# Patient Record
Sex: Female | Born: 1945 | Race: Black or African American | Hispanic: No | Marital: Single | Smoking: Former smoker
Health system: Southern US, Community
[De-identification: ages and names within clinical notes are randomized; demographics above are authoritative.]

## PROBLEM LIST (undated history)

## (undated) ENCOUNTER — Emergency Department (HOSPITAL_COMMUNITY): Admission: EM | Discharge status: Against Medical Advice | Payer: Medicare HMO

## (undated) ENCOUNTER — Emergency Department (HOSPITAL_BASED_OUTPATIENT_CLINIC_OR_DEPARTMENT_OTHER)
Admission: EM | Discharge status: Against Medical Advice | Payer: Medicare HMO | Attending: Emergency Medicine | Admitting: Emergency Medicine

## (undated) DIAGNOSIS — I639 Cerebral infarction, unspecified: Secondary | ICD-10-CM

## (undated) DIAGNOSIS — E785 Hyperlipidemia, unspecified: Secondary | ICD-10-CM

## (undated) DIAGNOSIS — J449 Chronic obstructive pulmonary disease, unspecified: Secondary | ICD-10-CM

## (undated) DIAGNOSIS — I739 Peripheral vascular disease, unspecified: Secondary | ICD-10-CM

## (undated) DIAGNOSIS — Z992 Dependence on renal dialysis: Secondary | ICD-10-CM

## (undated) DIAGNOSIS — I10 Essential (primary) hypertension: Secondary | ICD-10-CM

## (undated) DIAGNOSIS — N289 Disorder of kidney and ureter, unspecified: Secondary | ICD-10-CM

## (undated) DIAGNOSIS — N186 End stage renal disease: Secondary | ICD-10-CM

## (undated) DIAGNOSIS — I509 Heart failure, unspecified: Secondary | ICD-10-CM

## (undated) DIAGNOSIS — M1030 Gout due to renal impairment, unspecified site: Secondary | ICD-10-CM

## (undated) HISTORY — DX: Essential (primary) hypertension: I10

## (undated) HISTORY — PX: OTHER SURGICAL HISTORY: SHX169

## (undated) HISTORY — DX: Hyperlipidemia, unspecified: E78.5

---

## 1997-03-15 DIAGNOSIS — I639 Cerebral infarction, unspecified: Secondary | ICD-10-CM

## 1997-03-15 HISTORY — DX: Cerebral infarction, unspecified: I63.9

## 2000-03-15 HISTORY — PX: CAROTID ENDARTERECTOMY: SUR193

## 2009-08-21 ENCOUNTER — Inpatient Hospital Stay (HOSPITAL_COMMUNITY): Admission: EM | Admit: 2009-08-21 | Discharge: 2009-08-31 | Payer: Self-pay | Admitting: Emergency Medicine

## 2009-08-22 ENCOUNTER — Ambulatory Visit: Payer: Self-pay | Admitting: Vascular Surgery

## 2009-08-22 ENCOUNTER — Encounter (INDEPENDENT_AMBULATORY_CARE_PROVIDER_SITE_OTHER): Payer: Self-pay | Admitting: Nephrology

## 2009-12-12 ENCOUNTER — Ambulatory Visit: Payer: Self-pay | Admitting: Vascular Surgery

## 2009-12-12 ENCOUNTER — Ambulatory Visit (HOSPITAL_COMMUNITY): Admission: RE | Admit: 2009-12-12 | Discharge: 2009-12-12 | Payer: Self-pay | Admitting: Vascular Surgery

## 2010-01-13 ENCOUNTER — Ambulatory Visit: Payer: Self-pay | Admitting: Vascular Surgery

## 2010-01-16 ENCOUNTER — Ambulatory Visit: Payer: Self-pay | Admitting: Vascular Surgery

## 2010-01-16 ENCOUNTER — Ambulatory Visit (HOSPITAL_COMMUNITY)
Admission: RE | Admit: 2010-01-16 | Discharge: 2010-01-16 | Payer: Self-pay | Source: Home / Self Care | Admitting: Vascular Surgery

## 2010-03-04 ENCOUNTER — Ambulatory Visit (HOSPITAL_COMMUNITY)
Admission: RE | Admit: 2010-03-04 | Discharge: 2010-03-04 | Payer: Self-pay | Source: Home / Self Care | Attending: Surgery | Admitting: Surgery

## 2010-04-27 ENCOUNTER — Ambulatory Visit: Payer: Self-pay | Admitting: Surgery

## 2010-05-04 ENCOUNTER — Ambulatory Visit (INDEPENDENT_AMBULATORY_CARE_PROVIDER_SITE_OTHER): Payer: Medicare PPO | Admitting: Surgery

## 2010-05-04 DIAGNOSIS — N186 End stage renal disease: Secondary | ICD-10-CM

## 2010-05-04 NOTE — Assessment & Plan Note (Signed)
OFFICE VISIT  Alexandra [name], Alexandra [name] DOB:  1945/05/01                                       05/04/2010 CHART#:[id number]  REASON FOR VISIT:  Non-maturing fistula.  HISTORY:  This is a 66 year old female with end-stage renal disease dialyzing through a catheter.  She failed peritoneal dialysis.  She had a left upper arm fistula placed by Dr. Hart Rochester on November 4.  It has not fully matured.  They are not able to access it, and she comes in today for evaluation.  PHYSICAL EXAMINATION:  Heart rate 88, blood pressure 167/82, respiratory rate 20.  General:  She is well-appearing in no distress.  Respirations are nonlabored.  She has minimal edema.  There is a thrill within the left upper arm fistula.  However, it appears to be rather small and more difficult to feel as you go up the arm.  ASSESSMENT:  A non-maturing left upper arm fistula.  PLAN:  The fistula is going to need to be evaluated with ultrasound. Due to the patient's schedule, this could not be accommodated today.  I am going to have her come back in 2 weeks.  We will evaluate her fistula with ultrasound and make the appropriate arrangements for assisting in its maturation.    Jorge Ny, MD Electronically Signed  VWB/MEDQ  D:  05/04/2010  T:  05/04/2010  Job:  [id number]  cc:   Renaye Rakers, M.D. Duke Salvia Eliott Nine, M.D.

## 2010-05-04 NOTE — Assessment & Plan Note (Signed)
OFFICE VISIT  Alexandra, [name] DOB:  1945/12/28                                       05/04/2010 CHART#:[id number]  This may be a duplicate dictation, I do not know if the first one was complete.  This is a 65 year old female with end-stage renal disease who dialyzes through a catheter.  She had a left upper arm fistula placed by Dr. Hart Rochester on November 4.  They have not been able to use this secondary to nonmaturation.  She comes in today for evaluation.  PHYSICAL EXAMINATION:  Vital signs:  Heart rate 88, blood pressure 167/82, respiratory rate 20.  General:  She is well-appearing, in no distress.  Lungs:  Respirations nonlabored.  Extremities:  Are without edema.  Left arm:  There is palpable thrill within the fistula at the antecubital crease.  It is more difficult to feel as you go up the arm.  ASSESSMENT:  Nonmaturing fistula.  PLAN:  The patient needs to be further evaluated with a duplex ultrasound to evaluate for stenosis and/or branches so that will determine what the next step needs to be taken in order to get this to fully mature.  The patient due to her work constraints was unable to stay for the ultrasound.  We have rescheduled this for 2 weeks.    Jorge Ny, MD Electronically Signed  VWB/MEDQ  D:  05/04/2010  T:  05/04/2010  Job:  [id number]  cc:   Duke Salvia. Eliott Nine, M.D. Renaye Rakers, M.D.

## 2010-05-25 ENCOUNTER — Ambulatory Visit (INDEPENDENT_AMBULATORY_CARE_PROVIDER_SITE_OTHER): Payer: Medicare PPO | Admitting: Surgery

## 2010-05-25 ENCOUNTER — Encounter (INDEPENDENT_AMBULATORY_CARE_PROVIDER_SITE_OTHER): Payer: Medicare PPO

## 2010-05-25 DIAGNOSIS — T82598A Other mechanical complication of other cardiac and vascular devices and implants, initial encounter: Secondary | ICD-10-CM

## 2010-05-25 DIAGNOSIS — N186 End stage renal disease: Secondary | ICD-10-CM

## 2010-05-25 LAB — BASIC METABOLIC PANEL
GFR calc non Af Amer: 4 mL/min — ABNORMAL LOW (ref >60)
Glucose, Bld: 175 mg/dL — ABNORMAL HIGH (ref 70–99)
Potassium: 4.8 mEq/L (ref 3.5–5.1)
Sodium: 136 mEq/L (ref 135–145)

## 2010-05-25 LAB — POCT I-STAT 4, (NA,K, GLUC, HGB,HCT)
Glucose, Bld: 95 mg/dL (ref 70–99)
Hemoglobin: 18 g/dL — ABNORMAL HIGH (ref 12.0–15.0)
Potassium: 5.2 mEq/L — ABNORMAL HIGH (ref 3.5–5.1)

## 2010-05-25 LAB — CBC
HCT: 50.7 % — ABNORMAL HIGH (ref 36.0–46.0)
Hemoglobin: 16.7 g/dL — ABNORMAL HIGH (ref 12.0–15.0)
WBC: 9.8 10*3/uL (ref 4.0–10.5)

## 2010-05-25 LAB — GLUCOSE, CAPILLARY: Glucose-Capillary: 72 mg/dL (ref 70–99)

## 2010-05-25 LAB — HEPATIC FUNCTION PANEL
ALT: 26 U/L (ref 0–35)
AST: 32 U/L (ref 0–37)
Albumin: 3.8 g/dL (ref 3.5–5.2)
Alkaline Phosphatase: 81 U/L (ref 39–117)
Total Bilirubin: 0.4 mg/dL (ref 0.3–1.2)

## 2010-05-26 LAB — GLUCOSE, CAPILLARY
Glucose-Capillary: 126 mg/dL — ABNORMAL HIGH (ref 70–99)
Glucose-Capillary: 73 mg/dL (ref 70–99)

## 2010-05-26 LAB — SURGICAL PCR SCREEN: Staphylococcus aureus: NEGATIVE

## 2010-05-26 LAB — POCT I-STAT 4, (NA,K, GLUC, HGB,HCT): Hemoglobin: 9.5 g/dL — ABNORMAL LOW (ref 12.0–15.0)

## 2010-05-26 NOTE — Assessment & Plan Note (Unsigned)
OFFICE VISIT  Alexandra [name], Alexandra [name] DOB:  [birth year]                                       05/25/2010 CHART#:[id number]  The patient comes back today for followup of her fistula that was placed by Dr. Hart Rochester on November 4 that has not fully matured.  The last time I saw her she could not wait to have a graft study done because of clinic she had to get to.  That was done today.  PHYSICAL EXAMINATION:  Vital signs:  Heart rate 78, blood pressure 147/70, respiratory rate 12.  General:  She is well-appearing, no distress.  Respirations:  Nonlabored.  Cardiovascular:  There is a good thrill within her fistula which is easily visualized just at the level of the arteriovenous anastomosis.  It them becomes less visible and less palpable.  There is no swelling in the arm.  DIAGNOSTICS:  Fistula evaluation was performed which shows a high-grade stenosis just past the arteriovenous anastomosis as well as in the proximal vein near the shoulder.  ASSESSMENT:  Non-maturing left upper arm fistula.  PLAN:  I have scheduled the patient for fistulogram to be performed on Wednesday, March 21.  I will plan on accessing near the arteriovenous anastomosis so that I could potentially intervene.  I will need to scan her whole arm with ultrasound to make sure that the access in the antecubital is the most appropriate access initially.  She may require additional accesses to successfully intervene.    Jorge Ny, MD  VWB/MEDQ  D:  05/25/2010  T:  05/26/2010  Job:  [id number]  cc:   Miguel Aschoff, M.D. Renaye Rakers, M.D. Duke Salvia Eliott Nine, M.D. Dr. Allena Katz (Nephrology)

## 2010-05-31 LAB — BASIC METABOLIC PANEL
BUN: 74 mg/dL — ABNORMAL HIGH (ref 6–23)
Chloride: 106 mEq/L (ref 96–112)
Creatinine, Ser: 11.87 mg/dL — ABNORMAL HIGH (ref 0.4–1.2)
Glucose, Bld: 112 mg/dL — ABNORMAL HIGH (ref 70–99)

## 2010-05-31 LAB — CBC
HCT: 28 % — ABNORMAL LOW (ref 36.0–46.0)
Hemoglobin: 8.5 g/dL — ABNORMAL LOW (ref 12.0–15.0)
Hemoglobin: 9.5 g/dL — ABNORMAL LOW (ref 12.0–15.0)
MCHC: 33.3 g/dL (ref 30.0–36.0)
MCHC: 34.5 g/dL (ref 30.0–36.0)
MCV: 88.6 fL (ref 78.0–100.0)
MCV: 89 fL (ref 78.0–100.0)
MCV: 89.2 fL (ref 78.0–100.0)
Platelets: 203 10*3/uL (ref 150–400)
Platelets: 207 10*3/uL (ref 150–400)
RBC: 2.79 MIL/uL — ABNORMAL LOW (ref 3.87–5.11)
RBC: 3.15 MIL/uL — ABNORMAL LOW (ref 3.87–5.11)
RDW: 14.9 % (ref 11.5–15.5)
WBC: 7.2 10*3/uL (ref 4.0–10.5)
WBC: 8.4 10*3/uL (ref 4.0–10.5)

## 2010-05-31 LAB — RENAL FUNCTION PANEL
Albumin: 1.7 g/dL — ABNORMAL LOW (ref 3.5–5.2)
Albumin: 1.8 g/dL — ABNORMAL LOW (ref 3.5–5.2)
BUN: 36 mg/dL — ABNORMAL HIGH (ref 6–23)
CO2: 21 mEq/L (ref 19–32)
CO2: 21 mEq/L (ref 19–32)
CO2: 24 mEq/L (ref 19–32)
Calcium: 8.1 mg/dL — ABNORMAL LOW (ref 8.4–10.5)
Calcium: 8.5 mg/dL (ref 8.4–10.5)
Calcium: 8.6 mg/dL (ref 8.4–10.5)
Chloride: 105 mEq/L (ref 96–112)
Creatinine, Ser: 7.96 mg/dL — ABNORMAL HIGH (ref 0.4–1.2)
GFR calc Af Amer: 4 mL/min — ABNORMAL LOW (ref >60)
GFR calc Af Amer: 4 mL/min — ABNORMAL LOW (ref >60)
GFR calc Af Amer: 6 mL/min — ABNORMAL LOW (ref >60)
GFR calc non Af Amer: 3 mL/min — ABNORMAL LOW (ref >60)
GFR calc non Af Amer: 3 mL/min — ABNORMAL LOW (ref >60)
GFR calc non Af Amer: 5 mL/min — ABNORMAL LOW (ref >60)
Glucose, Bld: 114 mg/dL — ABNORMAL HIGH (ref 70–99)
Phosphorus: 6 mg/dL — ABNORMAL HIGH (ref 2.3–4.6)
Phosphorus: 7 mg/dL — ABNORMAL HIGH (ref 2.3–4.6)
Potassium: 4.5 mEq/L (ref 3.5–5.1)
Potassium: 4.6 mEq/L (ref 3.5–5.1)
Sodium: 135 mEq/L (ref 135–145)
Sodium: 136 mEq/L (ref 135–145)
Sodium: 137 mEq/L (ref 135–145)

## 2010-05-31 LAB — GLUCOSE, CAPILLARY
Glucose-Capillary: 105 mg/dL — ABNORMAL HIGH (ref 70–99)
Glucose-Capillary: 107 mg/dL — ABNORMAL HIGH (ref 70–99)
Glucose-Capillary: 109 mg/dL — ABNORMAL HIGH (ref 70–99)
Glucose-Capillary: 122 mg/dL — ABNORMAL HIGH (ref 70–99)
Glucose-Capillary: 125 mg/dL — ABNORMAL HIGH (ref 70–99)
Glucose-Capillary: 151 mg/dL — ABNORMAL HIGH (ref 70–99)
Glucose-Capillary: 155 mg/dL — ABNORMAL HIGH (ref 70–99)
Glucose-Capillary: 168 mg/dL — ABNORMAL HIGH (ref 70–99)
Glucose-Capillary: 226 mg/dL — ABNORMAL HIGH (ref 70–99)

## 2010-05-31 LAB — HEPATITIS B CORE ANTIBODY, TOTAL
Hep B Core Total Ab: POSITIVE — AB
Hep B Core Total Ab: POSITIVE — AB

## 2010-05-31 LAB — HEPATITIS B SURFACE ANTIBODY,QUALITATIVE: Hep B S Ab: NEGATIVE

## 2010-05-31 LAB — PROTIME-INR
INR: 0.89 (ref 0.00–1.49)
Prothrombin Time: 12 seconds (ref 11.6–15.2)

## 2010-05-31 LAB — POCT I-STAT 4, (NA,K, GLUC, HGB,HCT): HCT: 22 % — ABNORMAL LOW (ref 36.0–46.0)

## 2010-06-01 LAB — RENAL FUNCTION PANEL
Albumin: 1.6 g/dL — ABNORMAL LOW (ref 3.5–5.2)
BUN: 72 mg/dL — ABNORMAL HIGH (ref 6–23)
CO2: 22 mEq/L (ref 19–32)
CO2: 23 mEq/L (ref 19–32)
Calcium: 7.7 mg/dL — ABNORMAL LOW (ref 8.4–10.5)
Chloride: 105 mEq/L (ref 96–112)
Chloride: 107 mEq/L (ref 96–112)
Creatinine, Ser: 10.71 mg/dL — ABNORMAL HIGH (ref 0.4–1.2)
GFR calc Af Amer: 4 mL/min — ABNORMAL LOW (ref >60)
GFR calc Af Amer: 4 mL/min — ABNORMAL LOW (ref >60)
GFR calc non Af Amer: 3 mL/min — ABNORMAL LOW (ref >60)
GFR calc non Af Amer: 4 mL/min — ABNORMAL LOW (ref >60)
Glucose, Bld: 200 mg/dL — ABNORMAL HIGH (ref 70–99)
Phosphorus: 5.1 mg/dL — ABNORMAL HIGH (ref 2.3–4.6)
Phosphorus: 5.1 mg/dL — ABNORMAL HIGH (ref 2.3–4.6)
Potassium: 4.3 mEq/L (ref 3.5–5.1)
Sodium: 137 mEq/L (ref 135–145)

## 2010-06-01 LAB — HEPATITIS PANEL, ACUTE
HCV Ab: NEGATIVE
Hep A IgM: NEGATIVE
Hepatitis B Surface Ag: NEGATIVE

## 2010-06-01 LAB — URINALYSIS, ROUTINE W REFLEX MICROSCOPIC
Glucose, UA: NEGATIVE mg/dL
Ketones, ur: NEGATIVE mg/dL
Leukocytes, UA: NEGATIVE
Specific Gravity, Urine: >1.03 — ABNORMAL HIGH (ref 1.005–1.030)
pH: 6 (ref 5.0–8.0)

## 2010-06-01 LAB — COMPREHENSIVE METABOLIC PANEL
ALT: 17 U/L (ref 0–35)
ALT: 17 U/L (ref 0–35)
AST: 18 U/L (ref 0–37)
AST: 19 U/L (ref 0–37)
Albumin: 1.9 g/dL — ABNORMAL LOW (ref 3.5–5.2)
Alkaline Phosphatase: 74 U/L (ref 39–117)
BUN: 80 mg/dL — ABNORMAL HIGH (ref 6–23)
CO2: 17 mEq/L — ABNORMAL LOW (ref 19–32)
CO2: 20 mEq/L (ref 19–32)
CO2: 22 mEq/L (ref 19–32)
Calcium: 8.6 mg/dL (ref 8.4–10.5)
Chloride: 109 mEq/L (ref 96–112)
Chloride: 111 mEq/L (ref 96–112)
Chloride: 113 mEq/L — ABNORMAL HIGH (ref 96–112)
Creatinine, Ser: 12.84 mg/dL — ABNORMAL HIGH (ref 0.4–1.2)
GFR calc Af Amer: 3 mL/min — ABNORMAL LOW (ref >60)
GFR calc Af Amer: 4 mL/min — ABNORMAL LOW (ref >60)
GFR calc non Af Amer: 3 mL/min — ABNORMAL LOW (ref >60)
GFR calc non Af Amer: 3 mL/min — ABNORMAL LOW (ref >60)
GFR calc non Af Amer: 3 mL/min — ABNORMAL LOW (ref >60)
Glucose, Bld: 86 mg/dL (ref 70–99)
Glucose, Bld: 88 mg/dL (ref 70–99)
Potassium: 6.5 mEq/L (ref 3.5–5.1)
Sodium: 139 mEq/L (ref 135–145)
Sodium: 142 mEq/L (ref 135–145)
Total Bilirubin: 0.3 mg/dL (ref 0.3–1.2)
Total Bilirubin: 0.5 mg/dL (ref 0.3–1.2)
Total Bilirubin: 0.5 mg/dL (ref 0.3–1.2)

## 2010-06-01 LAB — CBC
HCT: 26.4 % — ABNORMAL LOW (ref 36.0–46.0)
HCT: 28.6 % — ABNORMAL LOW (ref 36.0–46.0)
HCT: 30.6 % — ABNORMAL LOW (ref 36.0–46.0)
Hemoglobin: 10 g/dL — ABNORMAL LOW (ref 12.0–15.0)
Hemoglobin: 10.5 g/dL — ABNORMAL LOW (ref 12.0–15.0)
MCHC: 33.8 g/dL (ref 30.0–36.0)
MCHC: 34.3 g/dL (ref 30.0–36.0)
MCHC: 35 g/dL (ref 30.0–36.0)
MCV: 87.6 fL (ref 78.0–100.0)
MCV: 87.9 fL (ref 78.0–100.0)
MCV: 88.4 fL (ref 78.0–100.0)
MCV: 88.8 fL (ref 78.0–100.0)
Platelets: 202 10*3/uL (ref 150–400)
Platelets: 212 10*3/uL (ref 150–400)
RBC: 2.99 MIL/uL — ABNORMAL LOW (ref 3.87–5.11)
RBC: 3.13 MIL/uL — ABNORMAL LOW (ref 3.87–5.11)
RBC: 3.26 MIL/uL — ABNORMAL LOW (ref 3.87–5.11)
RBC: 3.27 MIL/uL — ABNORMAL LOW (ref 3.87–5.11)
RBC: 3.48 MIL/uL — ABNORMAL LOW (ref 3.87–5.11)
RDW: 15 % (ref 11.5–15.5)
WBC: 6.7 10*3/uL (ref 4.0–10.5)
WBC: 6.8 10*3/uL (ref 4.0–10.5)
WBC: 7.7 10*3/uL (ref 4.0–10.5)
WBC: 7.9 10*3/uL (ref 4.0–10.5)
WBC: 9.9 10*3/uL (ref 4.0–10.5)

## 2010-06-01 LAB — GLUCOSE, CAPILLARY
Glucose-Capillary: 100 mg/dL — ABNORMAL HIGH (ref 70–99)
Glucose-Capillary: 110 mg/dL — ABNORMAL HIGH (ref 70–99)
Glucose-Capillary: 111 mg/dL — ABNORMAL HIGH (ref 70–99)
Glucose-Capillary: 122 mg/dL — ABNORMAL HIGH (ref 70–99)
Glucose-Capillary: 122 mg/dL — ABNORMAL HIGH (ref 70–99)
Glucose-Capillary: 127 mg/dL — ABNORMAL HIGH (ref 70–99)
Glucose-Capillary: 134 mg/dL — ABNORMAL HIGH (ref 70–99)
Glucose-Capillary: 156 mg/dL — ABNORMAL HIGH (ref 70–99)
Glucose-Capillary: 182 mg/dL — ABNORMAL HIGH (ref 70–99)
Glucose-Capillary: 184 mg/dL — ABNORMAL HIGH (ref 70–99)
Glucose-Capillary: 216 mg/dL — ABNORMAL HIGH (ref 70–99)
Glucose-Capillary: 75 mg/dL (ref 70–99)
Glucose-Capillary: 88 mg/dL (ref 70–99)

## 2010-06-01 LAB — IGG, IGA, IGM
IgA: 264 mg/dL (ref 68–378)
IgG (Immunoglobin G), Serum: 544 mg/dL — ABNORMAL LOW (ref 694–1618)
IgM, Serum: 92 mg/dL (ref 60–263)

## 2010-06-01 LAB — BASIC METABOLIC PANEL
CO2: 22 mEq/L (ref 19–32)
Chloride: 109 mEq/L (ref 96–112)
Creatinine, Ser: 11.08 mg/dL — ABNORMAL HIGH (ref 0.4–1.2)
GFR calc Af Amer: 4 mL/min — ABNORMAL LOW (ref >60)
Potassium: 3.8 mEq/L (ref 3.5–5.1)

## 2010-06-01 LAB — PROTEIN ELECTROPH W RFLX QUANT IMMUNOGLOBULINS
Alpha-2-Globulin: 15.6 % — ABNORMAL HIGH (ref 7.1–11.8)
Beta 2: 7.2 % — ABNORMAL HIGH (ref 3.2–6.5)
Beta Globulin: 5.3 % (ref 4.7–7.2)
Gamma Globulin: 11.2 % (ref 11.1–18.8)
M-Spike, %: 0.2 g/dL
Total Protein ELP: 5 g/dL — ABNORMAL LOW (ref 6.0–8.3)

## 2010-06-01 LAB — IMMUNOFIXATION ADD-ON

## 2010-06-01 LAB — URINE MICROSCOPIC-ADD ON

## 2010-06-01 LAB — URINE CULTURE

## 2010-06-01 LAB — PTH, INTACT AND CALCIUM
Calcium, Total (PTH): 7.2 mg/dL — ABNORMAL LOW (ref 8.4–10.5)
PTH: 422.5 pg/mL — ABNORMAL HIGH (ref 14.0–72.0)

## 2010-06-01 LAB — DIFFERENTIAL
Blasts: 0 %
Lymphocytes Relative: 19 % (ref 12–46)
Lymphs Abs: 1.5 10*3/uL (ref 0.7–4.0)
Monocytes Absolute: 0.5 10*3/uL (ref 0.1–1.0)
Monocytes Relative: 6 % (ref 3–12)
Promyelocytes Absolute: 0 %
nRBC: 0 /100 WBC

## 2010-06-01 LAB — ANA: Anti Nuclear Antibody(ANA): NEGATIVE

## 2010-06-01 LAB — HEMOGLOBIN A1C: Mean Plasma Glucose: 105 mg/dL (ref <117)

## 2010-06-01 LAB — IRON AND TIBC: Iron: 60 ug/dL (ref 42–135)

## 2010-06-01 LAB — VITAMIN D 25 HYDROXY (VIT D DEFICIENCY, FRACTURES): Vit D, 25-Hydroxy: 10 ng/mL — ABNORMAL LOW (ref 30–89)

## 2010-06-01 LAB — FOLATE RBC: RBC Folate: 681 ng/mL — ABNORMAL HIGH (ref 180–600)

## 2010-06-01 LAB — PHOSPHORUS: Phosphorus: 5 mg/dL — ABNORMAL HIGH (ref 2.3–4.6)

## 2010-06-03 ENCOUNTER — Ambulatory Visit (HOSPITAL_COMMUNITY)
Admission: RE | Admit: 2010-06-03 | Discharge: 2010-06-03 | Disposition: A | Discharge status: Home / Self Care | Payer: Medicare HMO | Source: Ambulatory Visit | Attending: Surgery | Admitting: Surgery

## 2010-06-03 DIAGNOSIS — I120 Hypertensive chronic kidney disease with stage 5 chronic kidney disease or end stage renal disease: Secondary | ICD-10-CM

## 2010-06-03 DIAGNOSIS — T82598A Other mechanical complication of other cardiac and vascular devices and implants, initial encounter: Secondary | ICD-10-CM | POA: Insufficient documentation

## 2010-06-03 DIAGNOSIS — Y849 Medical procedure, unspecified as the cause of abnormal reaction of the patient, or of later complication, without mention of misadventure at the time of the procedure: Secondary | ICD-10-CM | POA: Insufficient documentation

## 2010-06-03 DIAGNOSIS — T82898A Other specified complication of vascular prosthetic devices, implants and grafts, initial encounter: Secondary | ICD-10-CM

## 2010-06-03 DIAGNOSIS — N186 End stage renal disease: Secondary | ICD-10-CM

## 2010-06-03 LAB — POCT I-STAT, CHEM 8
HCT: 26 % — ABNORMAL LOW (ref 36.0–46.0)
Hemoglobin: 8.8 g/dL — ABNORMAL LOW (ref 12.0–15.0)
Potassium: 3.7 mEq/L (ref 3.5–5.1)
Sodium: 138 mEq/L (ref 135–145)

## 2010-06-04 LAB — POCT ACTIVATED CLOTTING TIME: Activated Clotting Time: 169 seconds

## 2010-06-09 NOTE — Procedures (Unsigned)
VASCULAR LAB EXAM  INDICATION:  Left arteriovenous fistula not maturing.  HISTORY: Chronic kidney disease. Diabetes:  No. Cardiac: Hypertension:  Yes.  EXAM:  Left hemodialysis duplex evaluation. Proximal venous outflow presents with elevated velocities with diameter narrowing present. Distal venous outflow presents with elevated velocities and diameter narrowing.  IMPRESSION: 1. The left venous outflow at the distal brachium presents with     diameter narrowing of 0.34 cm to a diameter of 0.61 cm and a peak     systolic velocity of 834 cm/second. 2. There is a second area of diameter narrowing at the proximal     brachium from 0.27 cm to 0.16 cm dilating back to 0.31 cm and a     velocity change from 115 cm/second to 424 cm/second. 3. No thrombus or valve leaflets are identified.     ___________________________________________ V. Charlena Cross, MD  SH/MEDQ  D:  05/25/2010  T:  05/25/2010  Job:  [id number]

## 2010-06-10 ENCOUNTER — Ambulatory Visit (HOSPITAL_COMMUNITY)
Admission: RE | Admit: 2010-06-10 | Discharge: 2010-06-10 | Disposition: A | Discharge status: Home / Self Care | Payer: Medicare PPO | Source: Ambulatory Visit | Attending: Surgery | Admitting: Surgery

## 2010-06-10 DIAGNOSIS — T82598A Other mechanical complication of other cardiac and vascular devices and implants, initial encounter: Secondary | ICD-10-CM | POA: Insufficient documentation

## 2010-06-10 DIAGNOSIS — N186 End stage renal disease: Secondary | ICD-10-CM

## 2010-06-10 DIAGNOSIS — Y832 Surgical operation with anastomosis, bypass or graft as the cause of abnormal reaction of the patient, or of later complication, without mention of misadventure at the time of the procedure: Secondary | ICD-10-CM | POA: Insufficient documentation

## 2010-06-10 DIAGNOSIS — I120 Hypertensive chronic kidney disease with stage 5 chronic kidney disease or end stage renal disease: Secondary | ICD-10-CM

## 2010-06-10 DIAGNOSIS — T82898A Other specified complication of vascular prosthetic devices, implants and grafts, initial encounter: Secondary | ICD-10-CM

## 2010-06-10 LAB — POCT I-STAT, CHEM 8
Calcium, Ion: 1.13 mmol/L (ref 1.12–1.32)
Hemoglobin: 10.5 g/dL — ABNORMAL LOW (ref 12.0–15.0)
Sodium: 137 mEq/L (ref 135–145)
TCO2: 29 mmol/L (ref 0–100)

## 2010-06-10 LAB — GLUCOSE, CAPILLARY: Glucose-Capillary: 91 mg/dL (ref 70–99)

## 2010-06-10 LAB — POCT ACTIVATED CLOTTING TIME: Activated Clotting Time: 187 seconds

## 2010-06-14 NOTE — Op Note (Signed)
  NAME:  Alexandra [name], Alexandra [name]       ACCOUNT NO.:  [id number]  MEDICAL RECORD NO.:  [id number]           PATIENT TYPE:  O  LOCATION:  [facility]                         FACILITY:  [facility]  PHYSICIAN:  Juleen China IV, MDDATE OF BIRTH:  11-24-1945  DATE OF PROCEDURE:  06/10/2010 DATE OF DISCHARGE:  06/10/2010                              OPERATIVE REPORT   PREOPERATIVE DIAGNOSIS:  Non-maturing arteriovenous fistula.  POSTOPERATIVE DIAGNOSIS:  Non-maturing arteriovenous fistula.  PROCEDURE PERFORMED: 1. Ultrasound access left cephalic vein fistula. 2. Percutaneous transluminal angioplasty arteriovenous anastomosis. 3. Follow up x1. 4. Intraarterial administration of nitroglycerin.  INDICATIONS:  Ms. [name] is a 65 year old female with end-stage renal disease who previously had a left upper arm cephalic vein fistula. Last week, she underwent balloon dilation of the proximal cephalic vein. Due to the access location, I could not intervene on the arteriovenous anastomosis of the wrist.  Therefore, she comes back in today for this procedure.  PROCEDURE:  The patient identified in the holding area, taken to room 8 and placed supine on the table.  The left arm was prepped and draped in the usual fashion.  Time-out was called.  Cephalic vein fistula was evaluated with ultrasound.  It was patent and compressible.  A visual ultrasound image was acquired.  It was accessed under ultrasound guidance with micropuncture needle and an 0.018  wire was then advanced without resistance and the micropuncture sheath was placed.  I then advanced a Bentson wire followed by a 5-French brachial sheath.  The patient was then given 4000 units of heparin as well as 400 mcg of intra- arterial nitroglycerin.  I then used an 0.14 Spartacore wire to cross arteriovenous anastomosis with wire access in the brachial artery.  I performed primary balloon angioplasty of the cephalic vein at the level of the  arterial anastomosis extending with the balloon, then extending into the brachial artery.  The balloon was taken to 10 atmospheres and held up for 1 minute.  Followup arteriogram reveals improved but suboptimal results.  I then up-sized to a 4x4 balloon.  The balloon was then advanced across this area of concern and taken to 9 atmospheres and held out for 1 minute.  Followup arteriogram revealed significantly improved results of the stenosis at the arteriovenous anastomosis.  At this point, I was satisfied with these results and I elected to terminate the procedure.  I administered another 400 mcg of nitroglycerin through the sheath.  Wires were removed.  The patient taken to the holding area for sheath pull.  IMPRESSION:  Successful balloon angioplasty of the arteriovenous anastomosis of the left brachiocephalic fistula using a 4-mm balloon.     Jorge Ny, MD     VWB/MEDQ  D:  06/10/2010  T:  06/10/2010  Job:  [id number]  Electronically Signed by Arelia Longest IV MD on 06/14/2010 09:30:26 PM

## 2010-06-14 NOTE — Op Note (Signed)
  NAME:  Alexandra [name], Alexandra [name]       ACCOUNT NO.:  [id number]  MEDICAL RECORD NO.:  [id number]           PATIENT TYPE:  O  LOCATION:  [facility]                         FACILITY:  [facility]  PHYSICIAN:  Juleen China IV, MDDATE OF BIRTH:  05/29/1945  DATE OF PROCEDURE:  06/03/2010 DATE OF DISCHARGE:  06/03/2010                              OPERATIVE REPORT   PREOPERATIVE DIAGNOSIS:  Non-maturing left arm arteriovenous fistula.  POSTOPERATIVE DIAGNOSIS:  Non-maturing left arm arteriovenous fistula.  PROCEDURES PERFORMED: 1. Ultrasound access left arm AV fistula. 2. Fistulogram. 3. Angioplasty left cephalic vein. 4. Follow-up x1. 5. Intraarterial administration of nitroglycerin.  INDICATIONS:  [name] is a 65 year old female with end-stage renal disease who has previously had a left upper arm fistula placed which has not matured.  She comes in today for evaluation.  PROCEDURE:  The patient was identified in the holding area, taken to room A, placed supine on the table.  The left arm was prepped and draped in usual fashion.  A time-out was called.  The left arm fistula was evaluated by ultrasound and found to be patent.  It was accessed under ultrasound guidance with a micropuncture needle and a 0.18 wire was advanced without resistance.  The micropuncture sheath was placed. Contrast injections were then performed through the sheath.  FINDINGS:  The cephalic vein in the upper arm is small in caliber at the cephalic to axillary junction.  There was an area of luminal narrowing. There was no evidence of central stenosis.  There was also high-grade stenosis at the arterial venous anastomosis.  At this point in time, decision was made to intervene over Bentson wire, a 5-French sheath was placed.  The patient developed spasm within the vein.  I administered 400 mcg of nitroglycerin through the sheath.  I then performed balloon angioplasty in the cephalic vein at the  cephalic axillary junction.  I first did this with a 4-mm balloon.  Follow up study revealed improved results, but with residual stenosis, I then performed another arteriogram and then up-sized to a 5-mm balloon. Balloon angioplasty was then performed taking the balloon to nominal pressure.  Completion study was then performed which showed improvement in the stenosis at the cephalic to axillary vein junction based on the duration as well as the procedure as well as the location of the access, I did not feel that proceeding with angioplasty of the anastomosis was warranted at this time.  The patient will be brought back for this procedure.  IMPRESSION.: 1. Stenosis within the cephalic vein in the cephalic to axillary     junction successfully dilated using a 5-mm     balloon. 2. Stenosis within the cephalic vein at the level of the arteriovenous     anastomosis, the patient will be brought back in 1 week for     intervention at this area.     Jorge Ny, MD     VWB/MEDQ  D:  06/07/2010  T:  06/08/2010  Job:  [id number] Electronically Signed by Arelia Longest IV MD on 06/14/2010 09:30:23 PM

## 2010-07-13 ENCOUNTER — Ambulatory Visit (INDEPENDENT_AMBULATORY_CARE_PROVIDER_SITE_OTHER): Payer: Medicare PPO | Admitting: Surgery

## 2010-07-13 DIAGNOSIS — N186 End stage renal disease: Secondary | ICD-10-CM

## 2010-07-14 NOTE — Assessment & Plan Note (Signed)
OFFICE VISIT  Alexandra, [name] DOB:  1945-07-10                                       07/13/2010 CHART#:[id number]  The patient comes back in today for further evaluation of her fistula. She is status post fistulogram x2 with dilation of her cephalic vein at its outflow tract and at the anastomosis.  She is still dialyzing through a catheter.  PHYSICAL EXAMINATION:  Vital signs:  Heart rate 81, blood pressure 137/67, respiratory rate 16.  General:  She is well-appearing, in no distress.  Respirations:  Are nonlabored.  Cardiovascular:  There is a thrill within the left arm fistula.  The vein is prominent in the antecubitum, however, is less prominent going up the arm, the vein feels somewhat small in caliber.  There is a branch across the arm, however, this appears small.  She has no evidence of steal syndrome.  I spoke with the patient today about the success of her fistula moving forward.  Her vein was very small at the time of her fistulogram as well as very reactive and quick to spasm.  At this point I feel that we should go ahead and try to access her fistula and to see if it is usable.  I do not think there is much more that should be done from percutaneous perspective to try to get this to function.  My suspicion is that this may not provide adequate access for dialysis for her.  If it is unable to be used I would not recommend proceeding with a right upper arm AV fistula.  We could consider branch ligation of the left upper arm fistula.  However, I do not think this is going to have a major impact on the fistula.    Jorge Ny, MD Electronically Signed  VWB/MEDQ  D:  07/13/2010  T:  07/14/2010  Job:  [id number]  cc:   Duke Salvia. Eliott Nine, M.D.

## 2010-07-28 NOTE — Assessment & Plan Note (Signed)
OFFICE VISIT   TANEIL, LAZARUS  DOB:  September 09, 1945                                       01/13/2010  CHART#:[id number]   The patient is a 65--year-old female with end-stage renal disease  currently on peroneal dialysis.  She was previously evaluated by Dr.  Cari Caraway in June of this year for vascular access but it was  decided that she was proceed with peroneal dialysis access and no access  was performed.  She now apparently is not getting dialysis through the  peroneal route and is referred for access.  She also needs a temporary  dialysis catheter.  She is right-handed.   CHRONIC MEDICAL PROBLEMS:  1. Diabetes type 2.  2. Hypertension.  3. Hyperlipidemia.  4. Negative coronary artery disease.   SOCIAL HISTORY:  She is single, is divorced, has a 30 year old son.  Smokes a pack of cigarettes per day.   REVIEW OF SYSTEMS:  Denies any chest pain, dyspnea on exertion; does  have bilateral edema secondary to fluid overload because of renal  failure.  Complains of muscle pain, leg discomfort with walking.  All  other systems in review of systems are negative.   PHYSICAL EXAMINATION:  Blood pressure 112/51, heart rate 73,  respirations 14.  General:  Well-developed, well-nourished female in no  apparent distress.  HEENT:  Exam normal for age.  EOMs intact.  Lungs:  Clear to auscultation.  No rhonchi or wheezing.  Cardiovascular:  Regular rhythm, no murmurs.  Carotid pulses 3+, no audible bruits.  Abdomen:  Soft, nontender with no masses.  Upper extremity exam reveals  3+ femoral, 3+ brachial and radial pulses palpable bilaterally.   Both cephalic veins are imaged using SonoSite ultrasound.  She also had  a formal vein mapping performed in the right arm and has previously had  a vein mapping performed in the left arm at [facility] and these were  compared.  After reviewing and interpreting all these studies, it  appears that the cephalic veins  are equal size in the upper arms and  borderline adequate in the left cephalic.   I think the best plan would be to attempt left upper arm AV fistula,  brachiocephalic, and insert dialysis catheter which we will schedule for  Friday, November 4.  Hopefully this will provide satisfactory vascular  access for this nice lady.     Quita Skye Hart Rochester, M.D.  Electronically Signed   JDL/MEDQ  D:  01/13/2010  T:  01/14/2010  Job:  [id number]   cc:   Renaye Rakers, M.D.  [facility] Sal[facility] Eliott Nine, M.D.

## 2010-07-28 NOTE — Procedures (Signed)
CEPHALIC VEIN MAPPING   INDICATION:  Right upper extremity vein mapping preoperative for aVF  placement.   HISTORY:  CKD.   EXAM:   The right cephalic vein is compressible.   Diameter measurements range from 0.16 cm to 0.35 cm.   The right basilic vein is compressible.   Diameter measurements range from 0.18 cm to 0.43 cm.   The left cephalic and basilic veins were not evaluated.   See attached worksheet for all measurements.   IMPRESSION:  Patent right cephalic and basilic veins with diameter  measurements as described above.   ___________________________________________  Quita Skye. Hart Rochester, M.D.   AS/MEDQ  D:  01/13/2010  T:  01/14/2010  Job:  [id number]

## 2010-08-31 ENCOUNTER — Other Ambulatory Visit: Payer: Self-pay | Admitting: Podiatry

## 2010-08-31 DIAGNOSIS — I739 Peripheral vascular disease, unspecified: Secondary | ICD-10-CM

## 2010-09-25 ENCOUNTER — Ambulatory Visit: Payer: Medicare PPO

## 2010-09-30 ENCOUNTER — Ambulatory Visit
Admission: RE | Admit: 2010-09-30 | Discharge: 2010-09-30 | Disposition: A | Discharge status: Home / Self Care | Payer: Medicare PPO | Source: Ambulatory Visit | Attending: Podiatry | Admitting: Podiatry

## 2010-09-30 VITALS — BP 153/67 | HR 86 | Resp 16 | Ht 68.0 in | Wt 132.0 lb

## 2010-09-30 DIAGNOSIS — I739 Peripheral vascular disease, unspecified: Secondary | ICD-10-CM

## 2010-10-12 ENCOUNTER — Ambulatory Visit (INDEPENDENT_AMBULATORY_CARE_PROVIDER_SITE_OTHER): Payer: Medicare HMO | Admitting: Surgery

## 2010-10-12 DIAGNOSIS — N186 End stage renal disease: Secondary | ICD-10-CM

## 2010-10-13 NOTE — Assessment & Plan Note (Signed)
OFFICE VISIT  Alexandra [name], Alexandra [name] DOB:  [birth year]                                       10/12/2010 CHART#:[id number]  The patient comes back today for followup of her left upper arm fistula which was placed by Dr. Hart Rochester 6-8 months ago.  I tried to get this improved with percutaneous revascularization.  However, this has ultimately gone on to failure.  She comes in today to discuss new access.  She dialyzes via a right-sided catheter.  PHYSICAL EXAM:  Vital signs:  Heart rate 97, blood pressure 193/68, respiratory rate 20.  General:  She is resting comfortably, in no acute distress.  Respirations:  Nonlabored.  Cardiovascular:  There is no thrill within the left upper arm fistula.  She has palpable right brachial pulse.  I reviewed her vein mapping which was done in November of 2011 which shows her cephalic vein to be 0.35 at the antecubital crease.  It stays this diameter throughout the upper arm.  ASSESSMENT:  End-stage renal disease needing permanent access.  PLAN:  I have discussed proceeding with a right brachiocephalic fistula to be performed on August 17 as the first case.  She is very familiar with risks and benefits.  All of her questions were answered today.    Jorge Ny, MD Electronically Signed  VWB/MEDQ  D:  10/12/2010  T:  10/13/2010  Job:  [id number]  cc:   Duke Salvia. Eliott Nine, M.D.

## 2010-10-23 ENCOUNTER — Other Ambulatory Visit (HOSPITAL_COMMUNITY): Payer: Medicare HMO

## 2010-10-28 ENCOUNTER — Encounter (HOSPITAL_COMMUNITY)
Admission: RE | Admit: 2010-10-28 | Discharge: 2010-10-28 | Disposition: A | Discharge status: Home / Self Care | Payer: Medicare HMO | Source: Ambulatory Visit | Attending: Surgery | Admitting: Surgery

## 2010-10-28 LAB — SURGICAL PCR SCREEN: Staphylococcus aureus: NEGATIVE

## 2010-10-30 ENCOUNTER — Ambulatory Visit (HOSPITAL_COMMUNITY)
Admission: RE | Admit: 2010-10-30 | Discharge: 2010-10-30 | Disposition: A | Discharge status: Home / Self Care | Payer: Medicare HMO | Source: Ambulatory Visit | Attending: Surgery | Admitting: Surgery

## 2010-10-30 DIAGNOSIS — N186 End stage renal disease: Secondary | ICD-10-CM

## 2010-10-30 DIAGNOSIS — I120 Hypertensive chronic kidney disease with stage 5 chronic kidney disease or end stage renal disease: Secondary | ICD-10-CM

## 2010-10-30 DIAGNOSIS — Z01812 Encounter for preprocedural laboratory examination: Secondary | ICD-10-CM | POA: Insufficient documentation

## 2010-10-30 DIAGNOSIS — E119 Type 2 diabetes mellitus without complications: Secondary | ICD-10-CM | POA: Insufficient documentation

## 2010-10-30 DIAGNOSIS — Z8673 Personal history of transient ischemic attack (TIA), and cerebral infarction without residual deficits: Secondary | ICD-10-CM | POA: Insufficient documentation

## 2010-10-30 HISTORY — PX: AV FISTULA PLACEMENT: SHX1204

## 2010-10-30 LAB — GLUCOSE, CAPILLARY
Glucose-Capillary: 111 mg/dL — ABNORMAL HIGH (ref 70–99)
Glucose-Capillary: 155 mg/dL — ABNORMAL HIGH (ref 70–99)

## 2010-10-30 LAB — POCT I-STAT 4, (NA,K, GLUC, HGB,HCT): Glucose, Bld: 108 mg/dL — ABNORMAL HIGH (ref 70–99)

## 2010-11-02 LAB — GLUCOSE, CAPILLARY
Glucose-Capillary: 58 mg/dL — ABNORMAL LOW (ref 70–99)
Glucose-Capillary: 150 mg/dL — ABNORMAL HIGH (ref 70–99)

## 2010-11-05 ENCOUNTER — Encounter: Payer: Self-pay | Admitting: Surgery

## 2010-11-12 NOTE — Op Note (Signed)
  NAME:  Alexandra [name], Alexandra [name]       ACCOUNT NO.:  [id number]  MEDICAL RECORD NO.:  [id number]  LOCATION:  [facility]                         FACILITY:  [facility]  PHYSICIAN:  Juleen China IV, MDDATE OF BIRTH:  [birth year]  DATE OF PROCEDURE:  10/30/2010 DATE OF DISCHARGE:                              OPERATIVE REPORT   PREOPERATIVE DIAGNOSIS:  End-stage renal disease.  POSTOPERATIVE DIAGNOSIS:  End-stage renal disease.  PROCEDURE PERFORMED:  Right brachiocephalic fistula.  TYPE OF ANESTHESIA:  General.  COMPLICATIONS:  None.  FINDINGS:  Vein dilated to a 3.5 dilator.  PROCEDURE:  The patient was identified in the holding area and taken to room 9, placed supine on the table.  LMA anesthesia was administered. The patient was prepped and draped in usual fashion.  Time-out was called.  Antibiotics were given.  Ultrasound was used to map the course of the cephalic vein of the upper arm.  A transverse incision was made in the antecubital crease.  The cephalic vein was identified within the incision, circumferentially dissected free, and encircled with a vessel loop.  It was mobilized proximally and distally throughout the width of the incision.  It was marked with ink pen to ensure proper orientation. Next, the brachial artery was dissected free.  This was an approximated 2.5-mm artery without significant disease.  It was isolated proximally and distally and encircled with vessel loops.  At this point in time, the patient was given 3000 units of heparin.  A right angle was placed on the distal cephalic vein which was then transected.  The distal end was tied off with a 2-0 silk tie.  I then used sequential dilators to dilate the cephalic vein guide up to a 3.5 dilator which went without difficulty.  Next, the artery was occluded with serrefine clamps, #11 blade was used to make an arteriotomy which was extended longitudinally with Potts scissors.  The vein was then spatulated to fit the  size of the arteriotomy.  A running anastomosis was created with 6-0 Prolene. Prior to completion, appropriate flushing maneuvers were performed.  The anastomosis was then completed.  I inspected the course of the vein and eventually there were no kinks.  The patient had a Doppler signal in her radial artery which did dampen somewhat with fistula compression, however, it still was multiphasic with the fistula patent.  It was difficult to palpate her fistula, however, there was a good Doppler signal all the way up her arm.  At this point, I was satisfied with these results.  The wound was irrigated.  I did not give protamine. The deep tissue was reapproximated with 3-0 Vicryl.  The skin was closed with 4-0 Vicryl.  Dermabond was placed in the wound.  The patient tolerated the procedure well.  There were no complications.     Jorge Ny, MD     VWB/MEDQ  D:  10/30/2010  T:  10/30/2010  Job:  [id number]  Electronically Signed by Arelia Longest IV MD on 11/12/2010 12:05:33 AM

## 2010-12-03 ENCOUNTER — Other Ambulatory Visit: Payer: Self-pay | Admitting: Family Medicine

## 2010-12-03 ENCOUNTER — Other Ambulatory Visit (HOSPITAL_COMMUNITY)
Admission: RE | Admit: 2010-12-03 | Discharge: 2010-12-03 | Disposition: A | Discharge status: Home / Self Care | Payer: Medicare HMO | Source: Ambulatory Visit | Attending: Family Medicine | Admitting: Family Medicine

## 2010-12-03 DIAGNOSIS — Z124 Encounter for screening for malignant neoplasm of cervix: Secondary | ICD-10-CM | POA: Insufficient documentation

## 2010-12-03 DIAGNOSIS — Z0389 Encounter for observation for other suspected diseases and conditions ruled out: Secondary | ICD-10-CM | POA: Insufficient documentation

## 2010-12-04 ENCOUNTER — Other Ambulatory Visit: Payer: Self-pay | Admitting: Obstetrics and Gynecology

## 2010-12-04 DIAGNOSIS — R0989 Other specified symptoms and signs involving the circulatory and respiratory systems: Secondary | ICD-10-CM

## 2010-12-11 ENCOUNTER — Encounter: Payer: Self-pay | Admitting: Surgery

## 2010-12-14 ENCOUNTER — Ambulatory Visit
Admission: RE | Admit: 2010-12-14 | Discharge: 2010-12-14 | Disposition: A | Discharge status: Home / Self Care | Payer: Medicare HMO | Source: Ambulatory Visit | Attending: Obstetrics and Gynecology | Admitting: Obstetrics and Gynecology

## 2010-12-14 ENCOUNTER — Ambulatory Visit (INDEPENDENT_AMBULATORY_CARE_PROVIDER_SITE_OTHER): Payer: Medicare HMO | Admitting: Surgery

## 2010-12-14 ENCOUNTER — Encounter: Payer: Self-pay | Admitting: Surgery

## 2010-12-14 VITALS — BP 196/76 | HR 79 | Resp 20 | Ht 68.0 in | Wt 134.7 lb

## 2010-12-14 DIAGNOSIS — N186 End stage renal disease: Secondary | ICD-10-CM

## 2010-12-14 DIAGNOSIS — R0989 Other specified symptoms and signs involving the circulatory and respiratory systems: Secondary | ICD-10-CM

## 2010-12-14 NOTE — Progress Notes (Signed)
The patient returns today for followup. She is status post right upper arm AV fistula on August 17. This is her first postoperative visit. She is complaining of some pain around the medial side of her incision. The incision is healed nicely. This most likely represents a neurapraxia. I can palpate a fistula throughout her upper arm it has a good thrill however it appears to be on the small side. This is not surprising given the findings on her other arm. I let the uterus one more month to fully mature I'll have her back in a month we will get a ultrasound of her fistula that time and make recommendations pending the results.

## 2011-01-15 ENCOUNTER — Encounter: Payer: Self-pay | Admitting: Surgery

## 2011-01-18 ENCOUNTER — Ambulatory Visit (INDEPENDENT_AMBULATORY_CARE_PROVIDER_SITE_OTHER): Payer: Medicare HMO | Admitting: *Deleted

## 2011-01-18 ENCOUNTER — Ambulatory Visit (INDEPENDENT_AMBULATORY_CARE_PROVIDER_SITE_OTHER): Payer: Medicare HMO | Admitting: Surgery

## 2011-01-18 ENCOUNTER — Encounter: Payer: Self-pay | Admitting: Surgery

## 2011-01-18 VITALS — BP 170/70 | HR 83 | Resp 16 | Ht 68.0 in | Wt 135.0 lb

## 2011-01-18 DIAGNOSIS — Z48812 Encounter for surgical aftercare following surgery on the circulatory system: Secondary | ICD-10-CM

## 2011-01-18 DIAGNOSIS — N186 End stage renal disease: Secondary | ICD-10-CM

## 2011-01-18 NOTE — Progress Notes (Signed)
The patient is here for her first postoperative visit. She underwent a right upper arm AV fistula on 10/30/2010 she has a history of a left upper arm fistula which we could not get to mature.  On examination today the right arm fistula feels better than the left one did however it is still not fully matured.  The patient did have an ultrasound today which shows elevated velocities throughout the entire vein the vein is of adequate diameter measuring 0.4 and greater. I believe the next step is to proceed with a fistulogram and to perform balloon venoplasty where necessary. The patient's other arm was very sensitive to wires and balloons being passed in it and went in to spasm quickly. I did discuss the fact that she may require placement of a graft but I do think attempt of a fistulogram and possible balloon venoplasty is warranted. Due to her schedule and my schedule he cannot have this arranged prior to December 5 so we have scheduled for December 5

## 2011-01-25 NOTE — Procedures (Unsigned)
VASCULAR LAB EXAM  INDICATION:  Followup right arm arteriovenous fistula placement.  HISTORY: Chronic kidney disease Diabetes:  No Cardiac: Hypertension:  Yes  EXAM:  Patent right arteriovenous fistula.  IMPRESSION:  Patent right arteriovenous fistula with depths, diameter, velocity and branch measurements noted on the following worksheet.  ___________________________________________ V. Charlena Cross, MD  EM/MEDQ  D:  01/19/2011  T:  01/19/2011  Job:  [id number]

## 2011-02-02 ENCOUNTER — Other Ambulatory Visit: Payer: Self-pay | Admitting: *Deleted

## 2011-02-15 ENCOUNTER — Other Ambulatory Visit: Payer: Self-pay

## 2011-02-16 ENCOUNTER — Other Ambulatory Visit: Payer: Self-pay | Admitting: *Deleted

## 2011-02-17 ENCOUNTER — Encounter (HOSPITAL_COMMUNITY): Payer: Self-pay

## 2011-02-17 ENCOUNTER — Ambulatory Visit (HOSPITAL_COMMUNITY): Admit: 2011-02-17 | Payer: Self-pay | Admitting: Surgery

## 2011-02-17 SURGERY — ASSESSMENT, SHUNT FUNCTION, WITH CONTRAST RADIOGRAPHIC STUDY
Anesthesia: LOCAL | Laterality: Right

## 2011-02-20 ENCOUNTER — Encounter (HOSPITAL_COMMUNITY): Payer: Self-pay | Admitting: Emergency Medicine

## 2011-02-20 ENCOUNTER — Observation Stay (HOSPITAL_COMMUNITY)
Admission: EM | Admit: 2011-02-20 | Discharge: 2011-02-21 | Disposition: A | Discharge status: Home / Self Care | Payer: Medicare HMO | Attending: Nephrology | Admitting: Nephrology

## 2011-02-20 ENCOUNTER — Emergency Department (HOSPITAL_COMMUNITY)
Admit: 2011-02-20 | Discharge: 2011-02-20 | Disposition: A | Discharge status: Home / Self Care | Payer: Medicare HMO | Source: Ambulatory Visit

## 2011-02-20 DIAGNOSIS — F17200 Nicotine dependence, unspecified, uncomplicated: Secondary | ICD-10-CM | POA: Insufficient documentation

## 2011-02-20 DIAGNOSIS — E119 Type 2 diabetes mellitus without complications: Secondary | ICD-10-CM | POA: Insufficient documentation

## 2011-02-20 DIAGNOSIS — Z992 Dependence on renal dialysis: Secondary | ICD-10-CM | POA: Insufficient documentation

## 2011-02-20 DIAGNOSIS — I120 Hypertensive chronic kidney disease with stage 5 chronic kidney disease or end stage renal disease: Principal | ICD-10-CM | POA: Insufficient documentation

## 2011-02-20 DIAGNOSIS — E785 Hyperlipidemia, unspecified: Secondary | ICD-10-CM | POA: Insufficient documentation

## 2011-02-20 DIAGNOSIS — N186 End stage renal disease: Secondary | ICD-10-CM | POA: Insufficient documentation

## 2011-02-20 HISTORY — DX: Disorder of kidney and ureter, unspecified: N28.9

## 2011-02-20 LAB — BASIC METABOLIC PANEL
BUN: 90 mg/dL — ABNORMAL HIGH (ref 6–23)
Chloride: 96 mEq/L (ref 96–112)
GFR calc Af Amer: 3 mL/min — ABNORMAL LOW (ref >90)
GFR calc non Af Amer: 3 mL/min — ABNORMAL LOW (ref >90)
Potassium: 4.8 mEq/L (ref 3.5–5.1)
Sodium: 138 mEq/L (ref 135–145)

## 2011-02-20 LAB — DIFFERENTIAL
Basophils Absolute: 0 10*3/uL (ref 0.0–0.1)
Basophils Relative: 0 % (ref 0–1)
Eosinophils Absolute: 0.2 10*3/uL (ref 0.0–0.7)
Neutro Abs: 3.6 10*3/uL (ref 1.7–7.7)
Neutrophils Relative %: 51 % (ref 43–77)

## 2011-02-20 LAB — CBC
MCH: 29.6 pg (ref 26.0–34.0)
MCHC: 33 g/dL (ref 30.0–36.0)
Platelets: 198 10*3/uL (ref 150–400)
RDW: 18 % — ABNORMAL HIGH (ref 11.5–15.5)

## 2011-02-20 MED ORDER — DARBEPOETIN ALFA-POLYSORBATE 200 MCG/0.4ML IJ SOLN
INTRAMUSCULAR | Status: AC
  Filled 2011-02-20: qty 0.4

## 2011-02-20 MED ORDER — SODIUM CHLORIDE 0.9 % IV SOLN: 100.0000 mL | INTRAVENOUS | Status: DC | PRN

## 2011-02-20 MED ORDER — DARBEPOETIN ALFA-POLYSORBATE 200 MCG/0.4ML IJ SOLN
200.0000 ug | Freq: Once | INTRAMUSCULAR | Status: AC
  Administered 2011-02-20: 200 ug via INTRAVENOUS

## 2011-02-20 MED ORDER — LIDOCAINE-PRILOCAINE 2.5-2.5 % EX CREA: 1.0000 "application " | TOPICAL_CREAM | CUTANEOUS | Status: DC | PRN

## 2011-02-20 MED ORDER — ALTEPLASE 2 MG IJ SOLR: 2.0000 mg | Freq: Once | INTRAMUSCULAR | Status: AC | PRN

## 2011-02-20 MED ORDER — NEPRO/CARBSTEADY PO LIQD
237.0000 mL | ORAL | Status: DC | PRN
  Filled 2011-02-20: qty 237

## 2011-02-20 MED ORDER — HEPARIN SODIUM (PORCINE) 1000 UNIT/ML DIALYSIS
1000.0000 [IU] | INTRAMUSCULAR | Status: DC | PRN
  Filled 2011-02-20: qty 1

## 2011-02-20 MED ORDER — LIDOCAINE HCL (PF) 1 % IJ SOLN
5.0000 mL | INTRAMUSCULAR | Status: DC | PRN
  Filled 2011-02-20: qty 5

## 2011-02-20 MED ORDER — PENTAFLUOROPROP-TETRAFLUOROETH EX AERO
1.0000 "application " | INHALATION_SPRAY | CUTANEOUS | Status: DC | PRN
  Filled 2011-02-20: qty 103.5

## 2011-02-20 MED ORDER — HEPARIN SODIUM (PORCINE) 1000 UNIT/ML DIALYSIS
100.0000 [IU]/kg | INTRAMUSCULAR | Status: DC | PRN
  Administered 2011-02-20: 1000 [IU] via INTRAVENOUS_CENTRAL

## 2011-02-20 NOTE — ED Notes (Signed)
Pt reports here for dialysis. [name] wants to have pt admitted to hospital to have dialysis. Pt reports that she has no complaints.

## 2011-02-20 NOTE — ED Notes (Signed)
Dr. Bascom Levels said to have [facility] admit Alexandra [name] and call him. He will then come by and write orders.  [phone]

## 2011-02-20 NOTE — ED Notes (Signed)
Dialysis called for pt; pt sent to dialysis

## 2011-02-20 NOTE — ED Provider Notes (Signed)
History   Patient presents requesting dialysis. Typically seen by Dr. Jamey Ripa, dialysis T/TR/Sat. States she is unable to return to normal center due to "conflict." She has no complaints at this time.  CSN: [id number] Arrival date & time: 02/20/2011  3:03 PM   First MD Initiated Contact with Patient 02/20/11 1648      No chief complaint on file.   (Consider location/radiation/quality/duration/timing/severity/associated sxs/prior treatment) The history is provided by the patient.    Past Medical History  Diagnosis Date  . Chronic kidney disease     esrd  . Diabetes mellitus   . Hyperlipidemia   . Hypertension   . Renal disorder     Past Surgical History  Procedure Date  . Carotid endarterectomy 2002    left  . Btl   . Rectal fissurectomy   . Av fistula placement 10/30/2010    right brachiocephalic     Family History  Problem Relation Age of Onset  . COPD Mother   . Kidney disease Mother   . Heart disease Father     History  Substance Use Topics  . Smoking status: Current Everyday Smoker -- 1.0 packs/day for 50 years    Types: Cigarettes  . Smokeless tobacco: Not on file  . Alcohol Use: No    OB History    Grav Para Term Preterm Abortions TAB SAB Ect Mult Living                  Review of Systems  All other systems reviewed and are negative.    Allergies  Codeine; Epinephrine; and Percocet  Home Medications   Current Outpatient Rx  Name Route Sig Dispense Refill  . ALLOPURINOL 100 MG PO TABS Oral Take 100 mg by mouth daily.      Marland Kitchen AMLODIPINE BESYLATE 5 MG PO TABS Oral Take 5 mg by mouth daily.     Marland Kitchen CALCIUM ACETATE 667 MG PO CAPS Oral Take 667 mg by mouth. Take 2 tabs three times / day w/ meals     . COENZYME Q10 150 MG PO CAPS Oral Take 150 mg by mouth. Take 2 tabs daily     . HYDRALAZINE HCL 25 MG PO TABS Oral Take 25 mg by mouth 2 (two) times daily. 3 tablets tid    . L-METHYLFOLATE-B6-B12 3-35-2 MG PO TABS Oral Take 1 tablet by mouth 2 (two)  times daily.     Marland Kitchen LOSARTAN POTASSIUM 50 MG PO TABS Oral Take 50 mg by mouth daily.      Marland Kitchen METOPROLOL SUCCINATE ER 50 MG PO TB24 Oral Take 50 mg by mouth daily.      Marland Kitchen RENA-VITE PO TABS Oral Take 1 tablet by mouth daily.      Marland Kitchen REPAGLINIDE 0.5 MG PO TABS Oral Take 0.5 mg by mouth 3 (three) times daily before meals.      . COLCHICINE 0.6 MG PO TABS Oral Take 0.6 mg by mouth daily as needed.       BP 168/54  Pulse 82  Temp(Src) 97.4 F (36.3 C) (Oral)  Resp 21  SpO2 100%  Physical Exam  Constitutional: She is oriented to person, place, and time. She appears well-developed and well-nourished. No distress.  HENT:  Head: Normocephalic and atraumatic.  Eyes: Conjunctivae and EOM are normal. Pupils are equal, round, and reactive to light.  Cardiovascular: Normal rate, regular rhythm and intact distal pulses.  Exam reveals no gallop and no friction rub.   No murmur heard. Pulmonary/Chest:  Effort normal and breath sounds normal. No respiratory distress.  Musculoskeletal: Normal range of motion. She exhibits no edema and no tenderness.  Neurological: She is alert and oriented to person, place, and time.  Skin: Skin is warm and dry. She is not diaphoretic.    ED Course  Procedures (including critical care time)  Labs Reviewed - No data to display No results found.   No diagnosis found.    MDM  Patient admitted by Dr. Jamey Ripa for dialysis.       Marcell Anger, [place] 02/21/11 [phone]

## 2011-02-20 NOTE — Progress Notes (Signed)
Patient ID: Alexandra [name], female   DOB: 02/13/1946, 65 y.o.   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

## 2011-02-20 NOTE — ED Notes (Signed)
Pt ambulated to and from restroom. 

## 2011-02-20 NOTE — ED Provider Notes (Signed)
Patient seen with Dr. Patria Mane. Page placed to nephrology. No emergent needs or complaint at this time.  Marcell Anger, [place] 02/20/11 [phone]

## 2011-02-20 NOTE — ED Notes (Signed)
Nephrology at bedside consulting with pt

## 2011-02-21 NOTE — ED Provider Notes (Signed)
Medical screening examination/treatment/procedure(s) were conducted as a shared visit with non-physician practitioner(s) and myself.  I personally evaluated the patient during the encounter  The patient normally dialyzes Tuesdays Thursdays and Saturdays however she missed her dialysis but Thursday as well as today (Saturday).  She presents today requesting dialysis.  She has no chest pain or shortness of breath.  Nothing worsens her symptoms.  Nothing improves her symptoms.  She currently has 0/10 pain.  She reports she spoke with her nephrologist to his scheduling her for dialysis in the hospital. Dr Bascom Levels  I spoke with Dr. Leretha Dykes he will limit the patient in right orders for dialysis.  The patient is in no acute distress  Lyanne Co, MD 02/21/11 [phone]

## 2011-02-21 NOTE — ED Provider Notes (Signed)
Medical screening examination/treatment/procedure(s) were conducted as a shared visit with non-physician practitioner(s) and myself.  I personally evaluated the patient during the encounter   Lyanne Co, MD 02/21/11 [phone]

## 2011-02-23 ENCOUNTER — Encounter (HOSPITAL_COMMUNITY): Payer: Self-pay

## 2011-02-23 ENCOUNTER — Observation Stay (HOSPITAL_COMMUNITY)
Admission: EM | Admit: 2011-02-23 | Discharge: 2011-02-27 | Disposition: A | Discharge status: Home / Self Care | Payer: Medicare HMO | Attending: Internal Medicine | Admitting: Internal Medicine

## 2011-02-23 ENCOUNTER — Inpatient Hospital Stay (HOSPITAL_COMMUNITY): Payer: Medicare HMO

## 2011-02-23 DIAGNOSIS — N186 End stage renal disease: Secondary | ICD-10-CM | POA: Insufficient documentation

## 2011-02-23 DIAGNOSIS — F17200 Nicotine dependence, unspecified, uncomplicated: Secondary | ICD-10-CM | POA: Insufficient documentation

## 2011-02-23 DIAGNOSIS — I120 Hypertensive chronic kidney disease with stage 5 chronic kidney disease or end stage renal disease: Secondary | ICD-10-CM | POA: Insufficient documentation

## 2011-02-23 DIAGNOSIS — D638 Anemia in other chronic diseases classified elsewhere: Secondary | ICD-10-CM | POA: Insufficient documentation

## 2011-02-23 DIAGNOSIS — E785 Hyperlipidemia, unspecified: Secondary | ICD-10-CM | POA: Insufficient documentation

## 2011-02-23 DIAGNOSIS — N2581 Secondary hyperparathyroidism of renal origin: Secondary | ICD-10-CM | POA: Diagnosis present

## 2011-02-23 DIAGNOSIS — Z992 Dependence on renal dialysis: Principal | ICD-10-CM | POA: Insufficient documentation

## 2011-02-23 DIAGNOSIS — E119 Type 2 diabetes mellitus without complications: Secondary | ICD-10-CM | POA: Insufficient documentation

## 2011-02-23 HISTORY — DX: Gout due to renal impairment, unspecified site: M10.30

## 2011-02-23 LAB — CBC
Hemoglobin: 8.6 g/dL — ABNORMAL LOW (ref 12.0–15.0)
MCHC: 32.8 g/dL (ref 30.0–36.0)
Platelets: 153 10*3/uL (ref 150–400)
RDW: 18.3 % — ABNORMAL HIGH (ref 11.5–15.5)

## 2011-02-23 LAB — MRSA PCR SCREENING: MRSA by PCR: NEGATIVE

## 2011-02-23 LAB — DIFFERENTIAL
Basophils Absolute: 0 10*3/uL (ref 0.0–0.1)
Basophils Relative: 0 % (ref 0–1)
Neutro Abs: 3.4 10*3/uL (ref 1.7–7.7)
Neutrophils Relative %: 48 % (ref 43–77)

## 2011-02-23 LAB — GLUCOSE, CAPILLARY
Glucose-Capillary: 143 mg/dL — ABNORMAL HIGH (ref 70–99)
Glucose-Capillary: 114 mg/dL — ABNORMAL HIGH (ref 70–99)

## 2011-02-23 MED ORDER — COENZYME Q10 150 MG PO CAPS: 150.0000 mg | ORAL_CAPSULE | Freq: Two times a day (BID) | ORAL | Status: DC

## 2011-02-23 MED ORDER — L-METHYLFOLATE-B6-B12 3-35-2 MG PO TABS
1.0000 | ORAL_TABLET | Freq: Two times a day (BID) | ORAL | Status: DC
  Administered 2011-02-24 – 2011-02-27 (×7): 1 via ORAL
  Filled 2011-02-23 (×9): qty 1

## 2011-02-23 MED ORDER — COLCHICINE 0.6 MG PO TABS
0.6000 mg | ORAL_TABLET | Freq: Every day | ORAL | Status: DC | PRN
  Filled 2011-02-23: qty 1

## 2011-02-23 MED ORDER — LOSARTAN POTASSIUM 50 MG PO TABS
50.0000 mg | ORAL_TABLET | Freq: Every day | ORAL | Status: DC
  Administered 2011-02-23 – 2011-02-26 (×4): 50 mg via ORAL
  Filled 2011-02-23 (×5): qty 1

## 2011-02-23 MED ORDER — RENA-VITE PO TABS
1.0000 | ORAL_TABLET | Freq: Every day | ORAL | Status: DC
  Administered 2011-02-23 – 2011-02-27 (×5): 1 via ORAL
  Filled 2011-02-23 (×5): qty 1

## 2011-02-23 MED ORDER — METOPROLOL SUCCINATE ER 50 MG PO TB24
50.0000 mg | ORAL_TABLET | Freq: Every day | ORAL | Status: DC
  Administered 2011-02-23 – 2011-02-26 (×4): 50 mg via ORAL
  Filled 2011-02-23 (×5): qty 1

## 2011-02-23 MED ORDER — HEPARIN SODIUM (PORCINE) 1000 UNIT/ML DIALYSIS
100.0000 [IU]/kg | INTRAMUSCULAR | Status: DC | PRN
  Administered 2011-02-23: 6000 [IU] via INTRAVENOUS_CENTRAL
  Filled 2011-02-23: qty 6

## 2011-02-23 MED ORDER — SODIUM CHLORIDE 0.9 % IJ SOLN: 3.0000 mL | INTRAMUSCULAR | Status: DC | PRN

## 2011-02-23 MED ORDER — ACETAMINOPHEN 650 MG RE SUPP: 650.0000 mg | Freq: Four times a day (QID) | RECTAL | Status: DC | PRN

## 2011-02-23 MED ORDER — REPAGLINIDE 0.5 MG PO TABS
0.5000 mg | ORAL_TABLET | Freq: Once | ORAL | Status: DC
  Filled 2011-02-23: qty 1

## 2011-02-23 MED ORDER — HYDRALAZINE HCL 25 MG PO TABS
25.0000 mg | ORAL_TABLET | Freq: Three times a day (TID) | ORAL | Status: DC
  Administered 2011-02-24 (×2): 25 mg via ORAL
  Filled 2011-02-23 (×4): qty 1

## 2011-02-23 MED ORDER — AMLODIPINE BESYLATE 5 MG PO TABS
5.0000 mg | ORAL_TABLET | Freq: Every day | ORAL | Status: DC
  Administered 2011-02-23 – 2011-02-26 (×4): 5 mg via ORAL
  Filled 2011-02-23 (×5): qty 1

## 2011-02-23 MED ORDER — SODIUM CHLORIDE 0.9 % IV SOLN: 250.0000 mL | INTRAVENOUS | Status: DC | PRN

## 2011-02-23 MED ORDER — REPAGLINIDE 0.5 MG PO TABS
0.5000 mg | ORAL_TABLET | Freq: Three times a day (TID) | ORAL | Status: DC
  Administered 2011-02-24 – 2011-02-27 (×9): 0.5 mg via ORAL
  Filled 2011-02-23 (×13): qty 1

## 2011-02-23 MED ORDER — CALCIUM ACETATE 667 MG PO CAPS
667.0000 mg | ORAL_CAPSULE | Freq: Three times a day (TID) | ORAL | Status: DC
  Administered 2011-02-24: 667 mg via ORAL
  Administered 2011-02-24: 1334 mg via ORAL
  Administered 2011-02-24: 667 mg via ORAL
  Filled 2011-02-23 (×4): qty 1

## 2011-02-23 MED ORDER — ACETAMINOPHEN 325 MG PO TABS: 650.0000 mg | ORAL_TABLET | Freq: Four times a day (QID) | ORAL | Status: DC | PRN

## 2011-02-23 MED ORDER — SODIUM CHLORIDE 0.9 % IJ SOLN
3.0000 mL | Freq: Two times a day (BID) | INTRAMUSCULAR | Status: DC
  Administered 2011-02-27: 3 mL via INTRAVENOUS

## 2011-02-23 MED ORDER — ALLOPURINOL 100 MG PO TABS
100.0000 mg | ORAL_TABLET | Freq: Every day | ORAL | Status: DC
  Administered 2011-02-24 – 2011-02-27 (×4): 100 mg via ORAL
  Filled 2011-02-23 (×5): qty 1

## 2011-02-23 MED ORDER — DOCUSATE SODIUM 100 MG PO CAPS
200.0000 mg | ORAL_CAPSULE | Freq: Every day | ORAL | Status: DC
  Administered 2011-02-24 – 2011-02-26 (×3): 200 mg via ORAL
  Filled 2011-02-23 (×4): qty 2

## 2011-02-23 NOTE — ED Notes (Signed)
Did cbg on patient it was 10

## 2011-02-23 NOTE — Progress Notes (Signed)
Patient ID: Alexandra [name], female   DOB: 04-09-1945, 65 y.o.   MRN: [id number] Pt  In needing dialysis.chest with few rales.  See orders. Jarome Matin 02/23/2011 3:34 PM

## 2011-02-23 NOTE — H&P (Signed)
PCP:   [name], MD   Chief Complaint:  Need of hemodialysis.  HPI: 65 year old female with a past medical history significant for hypertension, diabetes, hyperlipidemia, and end-stage renal disease (of hemodialysis Tuesday-Thursday-Saturday). Came into the hospital in order to receive her hemodialysis. At this point patient is no having any acute complaints and denies shortness of breath.  Allergies:   Allergies  Allergen Reactions  . Codeine     Passes out  . Epinephrine Other (See Comments)    Hypotension  . Percocet (Oxycodone-Acetaminophen)     Passes  out      Past Medical History  Diagnosis Date  . Chronic kidney disease     esrd  . Diabetes mellitus   . Hyperlipidemia   . Hypertension   . Renal disorder   . Dialysis care     tues, thurs, sat    Past Surgical History  Procedure Date  . Carotid endarterectomy 2002    left  . Btl   . Rectal fissurectomy   . Av fistula placement 10/30/2010    right brachiocephalic     Prior to Admission medications   Medication Sig Start Date End Date Taking? Authorizing Provider  allopurinol (ZYLOPRIM) 100 MG tablet Take 100 mg by mouth daily.     Yes Historical Provider, MD  amLODipine (NORVASC) 5 MG tablet Take 5 mg by mouth daily.    Yes Historical Provider, MD  calcium acetate (PHOSLO) 667 MG capsule Take 667 mg by mouth. Take 2 tabs three times / day w/ meals    Yes Historical Provider, MD  Coenzyme Q10 150 MG CAPS Take 150 mg by mouth. Take 2 tabs daily    Yes Historical Provider, MD  docusate sodium (COLACE) 100 MG capsule Take 200 mg by mouth at bedtime.     Yes Historical Provider, MD  hydrALAZINE (APRESOLINE) 25 MG tablet Take 25 mg by mouth 2 (two) times daily. 3 tablets tid   Yes Historical Provider, MD  l-methylfolate-B6-B12 (METANX) 3-35-2 MG TABS Take 1 tablet by mouth 2 (two) times daily.    Yes Historical Provider, MD  losartan (COZAAR) 50 MG tablet Take 50 mg by mouth daily.     Yes Historical  Provider, MD  metoprolol (TOPROL-XL) 50 MG 24 hr tablet Take 50 mg by mouth daily.     Yes Historical Provider, MD  multivitamin (RENA-VIT) TABS tablet Take 1 tablet by mouth daily.     Yes Historical Provider, MD  repaglinide (PRANDIN) 0.5 MG tablet Take 0.5 mg by mouth 3 (three) times daily before meals.     Yes Historical Provider, MD  colchicine 0.6 MG tablet Take 0.6 mg by mouth daily as needed. As needed for pain.    Historical Provider, MD    Social History:  reports that she has been smoking Cigarettes.  She has a 50 pack-year smoking history. She does not have any smokeless tobacco history on file. She reports that she does not drink alcohol or use illicit drugs.  Family History  Problem Relation Age of Onset  . COPD Mother   . Kidney disease Mother   . Heart disease Father     Review of Systems:  Constitutional: Denies fever, chills, diaphoresis, appetite change and fatigue.  HEENT: Denies photophobia, eye pain, redness, hearing loss, ear pain, congestion, sore throat, rhinorrhea, sneezing, mouth sores, trouble swallowing, neck pain, neck stiffness and tinnitus.   Respiratory: Denies SOB, DOE, cough, chest tightness,  and wheezing.   Cardiovascular: Denies chest  pain, palpitations and leg swelling.  Gastrointestinal: Denies nausea, vomiting, abdominal pain, diarrhea, constipation, blood in stool and abdominal distention.  Genitourinary: Denies dysuria, urgency, frequency, hematuria, flank pain and difficulty urinating.  Musculoskeletal: Denies myalgias, back pain, joint swelling, arthralgias and gait problem.  Skin: Denies pallor, rash and wound.  Neurological: Denies dizziness, seizures, syncope, weakness, light-headedness, numbness and headaches.  Hematological: Denies adenopathy. Easy bruising, personal or family bleeding history  Psychiatric/Behavioral: Denies suicidal ideation, mood changes, confusion, nervousness, sleep disturbance and agitation   Physical Exam: Blood  pressure 202/75, pulse 84, temperature 98.5 F (36.9 C), temperature source Oral, resp. rate 16, SpO2 100.00%. Constitutional: She appears well-developed and well-nourished. In no acute distress.  HENT:  Head: Atraumatic.  Eyes: Conjunctivae are normal. Right eye exhibits no discharge. Left eye exhibits no discharge. No scleral icterus.  Neck: Neck supple.  Cardiovascular: Normal rate and regular rhythm. No JVD Pulmonary/Chest: Clear to auscultation bilaterally. Abdominal: Soft. Bowel sounds are normal. There is no tenderness. There is no rebound.  Extremities: No edema, cyanosis or clubbing.  Neurological: Alert, awake and oriented x3, cranial nerves 2-12 grossly intact, no focal neurologic deficit.  Skin: No rash noted. Right subclavian Port-A-Cath without signs of infection.  Labs on Admission:  No results found for this or any previous visit (from the past 48 hour(s)).  Radiological Exams on Admission: No results found.   Assessment/Plan 1-End stage renal disease: Patient came into the hospital in order to receive hemodialysis treatment. No having signs of volume overload. Dr. Bascom Levels has been contacted to provide orders for hemodialysis  2-HTN (hypertension): Stable and well controlled. Continue current medications.  3-DM (diabetes mellitus): Continue Prandin.  4-Hyperparathyroidism, secondary renal: Continue PhosLo.  5-Hyperlipidemia: Follow low fat diet and continue CoQ10 as an outpatient. Further adjustment in position on the statins to be taken by PCP.  6-Anemia of chronic disease: Patient to receive Aranesp and Iron as needed during her HD. Renal to determine doses and frequency.   Time Spent on Admission: 45 minutes  Keitra Carusone [facility] [phone]  02/23/2011, 4:19 PM

## 2011-02-23 NOTE — ED Notes (Signed)
Assumed care of pt.  No distress noted.  Pt upset about the amount of time that she has been waiting.  Will continue to update pt on POC.    Spoke with Dewayne Hatch, RN in dialysis, blood to be drawn upstairs in dialysis.  RN will call when they are ready for pt-machine being cleaned at this time.  approx 30 minutes until pt can go up.  Pt updated.

## 2011-02-23 NOTE — ED Provider Notes (Signed)
Medical screening examination/treatment/procedure(s) were performed by non-physician practitioner and as supervising physician I was immediately available for consultation/collaboration.   Glynn Octave, MD 02/23/11 [phone]

## 2011-02-23 NOTE — ED Notes (Signed)
Pt here for dialysis - tues, thurs, sats

## 2011-02-23 NOTE — ED Provider Notes (Signed)
History    65 year old female who presents to the ED requesting for dialysis. She is a Tuesday/Thursday/Saturday dialysis patient. She is here requesting for her usual dialysis appointment. She has not missed her last appointment. Has no complaint. Patient state her doctor, Dr. Leretha Dykes, request for her to come to the ED.  CSN: [id number] Arrival date & time: 02/23/2011  1:46 PM   First MD Initiated Contact with Patient 02/23/11 1347      Chief Complaint  Patient presents with  . Needs Dialysis     (Consider location/radiation/quality/duration/timing/severity/associated sxs/prior treatment) HPI  Past Medical History  Diagnosis Date  . Chronic kidney disease     esrd  . Diabetes mellitus   . Hyperlipidemia   . Hypertension   . Renal disorder   . Dialysis care     tues, thurs, sat    Past Surgical History  Procedure Date  . Carotid endarterectomy 2002    left  . Btl   . Rectal fissurectomy   . Av fistula placement 10/30/2010    right brachiocephalic     Family History  Problem Relation Age of Onset  . COPD Mother   . Kidney disease Mother   . Heart disease Father     History  Substance Use Topics  . Smoking status: Current Everyday Smoker -- 1.0 packs/day for 50 years    Types: Cigarettes  . Smokeless tobacco: Not on file  . Alcohol Use: No    OB History    Grav Para Term Preterm Abortions TAB SAB Ect Mult Living                  Review of Systems  All other systems reviewed and are negative.    Allergies  Codeine; Epinephrine; and Percocet  Home Medications   Current Outpatient Rx  Name Route Sig Dispense Refill  . ALLOPURINOL 100 MG PO TABS Oral Take 100 mg by mouth daily.      Marland Kitchen AMLODIPINE BESYLATE 5 MG PO TABS Oral Take 5 mg by mouth daily.     Marland Kitchen CALCIUM ACETATE 667 MG PO CAPS Oral Take 667 mg by mouth. Take 2 tabs three times / day w/ meals     . COENZYME Q10 150 MG PO CAPS Oral Take 150 mg by mouth. Take 2 tabs daily     . COLCHICINE 0.6 MG  PO TABS Oral Take 0.6 mg by mouth daily as needed.     Marland Kitchen HYDRALAZINE HCL 25 MG PO TABS Oral Take 25 mg by mouth 2 (two) times daily. 3 tablets tid    . L-METHYLFOLATE-B6-B12 3-35-2 MG PO TABS Oral Take 1 tablet by mouth 2 (two) times daily.     Marland Kitchen LOSARTAN POTASSIUM 50 MG PO TABS Oral Take 50 mg by mouth daily.      Marland Kitchen METOPROLOL SUCCINATE ER 50 MG PO TB24 Oral Take 50 mg by mouth daily.      Marland Kitchen RENA-VITE PO TABS Oral Take 1 tablet by mouth daily.      Marland Kitchen REPAGLINIDE 0.5 MG PO TABS Oral Take 0.5 mg by mouth 3 (three) times daily before meals.        BP 181/63  Pulse 87  Temp(Src) 98.5 F (36.9 C) (Oral)  Resp 18  SpO2 100%  Physical Exam  Nursing note and vitals reviewed. Constitutional: She appears well-developed and well-nourished.       Awake, alert, nontoxic appearance  HENT:  Head: Atraumatic.  Eyes: Conjunctivae are normal. Right eye  exhibits no discharge. Left eye exhibits no discharge. No scleral icterus.  Neck: Neck supple.  Cardiovascular: Normal rate and regular rhythm.   Pulmonary/Chest: Effort normal and breath sounds normal. No respiratory distress. She has no wheezes. She exhibits no tenderness.  Abdominal: Soft. Bowel sounds are normal. There is no tenderness. There is no rebound.  Musculoskeletal: She exhibits no tenderness.       Baseline ROM, no obvious new focal weakness  Neurological:       Mental status and motor strength appears baseline for patient and situation  Skin: No rash noted.     Psychiatric: She has a normal mood and affect.    ED Course  Procedures (including critical care time)  Labs Reviewed - No data to display No results found.   No diagnosis found.    MDM  She requests not to have her lab drawn in the ED because she is a difficult stick. I've call and spoke with Dr. Leretha Dykes, who request to have patient admitted by [facility]. Dr. Bascom Levels states she can have lab drawn at dialysis center.    3:11 PM [facility] is aware and will  admit pt.  Dr. Bascom Levels is here in ED to evaluate pt.  Pt currently in NAD.  Fayrene Helper, PA 02/23/11 1511

## 2011-02-23 NOTE — ED Notes (Signed)
Pt refusing lab draw. PA aware of situation.

## 2011-02-23 NOTE — ED Notes (Signed)
Notified RN of B/P 202/75 taken at left arm.

## 2011-02-23 NOTE — Progress Notes (Signed)
02/23/2011 patient came from the emergency room to 6700, she is alert and oriented, also ambulatory. Patient skin is fine, refuse for nurse to assess her sacrum and look at her body from head to toe. Reported by emergency room nurse patient to the hospital for hemodialysis. Gloriajean Dell RN

## 2011-02-24 LAB — BASIC METABOLIC PANEL
CO2: 27 mEq/L (ref 19–32)
Calcium: 10.1 mg/dL (ref 8.4–10.5)
GFR calc Af Amer: 4 mL/min — ABNORMAL LOW (ref >90)
GFR calc non Af Amer: 4 mL/min — ABNORMAL LOW (ref >90)
Sodium: 142 mEq/L (ref 135–145)

## 2011-02-24 LAB — MAGNESIUM: Magnesium: 2.6 mg/dL — ABNORMAL HIGH (ref 1.5–2.5)

## 2011-02-24 LAB — PHOSPHORUS: Phosphorus: 2.5 mg/dL (ref 2.3–4.6)

## 2011-02-24 LAB — GLUCOSE, CAPILLARY: Glucose-Capillary: 124 mg/dL — ABNORMAL HIGH (ref 70–99)

## 2011-02-24 MED ORDER — TUBERCULIN PPD 5 UNIT/0.1ML ID SOLN
5.0000 [IU] | Freq: Once | INTRADERMAL | Status: DC
  Filled 2011-02-24: qty 0.1

## 2011-02-24 MED ORDER — HEPARIN SODIUM (PORCINE) 1000 UNIT/ML DIALYSIS: 1000.0000 [IU] | INTRAMUSCULAR | Status: DC | PRN

## 2011-02-24 MED ORDER — HEPARIN SODIUM (PORCINE) 1000 UNIT/ML DIALYSIS
20.0000 [IU]/kg | INTRAMUSCULAR | Status: DC | PRN
  Administered 2011-02-27: 1200 [IU] via INTRAVENOUS_CENTRAL

## 2011-02-24 MED ORDER — NEPRO/CARBSTEADY PO LIQD: 237.0000 mL | ORAL | Status: DC | PRN

## 2011-02-24 MED ORDER — SODIUM CHLORIDE 0.9 % IV SOLN: 100.0000 mL | INTRAVENOUS | Status: DC | PRN

## 2011-02-24 MED ORDER — PARICALCITOL 5 MCG/ML IV SOLN
INTRAVENOUS | Status: AC
  Administered 2011-02-24: 1 ug via INTRAVENOUS
  Filled 2011-02-24: qty 1

## 2011-02-24 MED ORDER — CALCIUM ACETATE 667 MG PO CAPS
1334.0000 mg | ORAL_CAPSULE | Freq: Three times a day (TID) | ORAL | Status: DC
  Administered 2011-02-24 – 2011-02-27 (×8): 1334 mg via ORAL
  Filled 2011-02-24 (×11): qty 2

## 2011-02-24 MED ORDER — PENTAFLUOROPROP-TETRAFLUOROETH EX AERO: 1.0000 "application " | INHALATION_SPRAY | CUTANEOUS | Status: DC | PRN

## 2011-02-24 MED ORDER — TUBERCULIN PPD 5 UNIT/0.1ML ID SOLN: 5.0000 [IU] | Freq: Once | INTRADERMAL | Status: DC

## 2011-02-24 MED ORDER — BACITRACIN-NEOMYCIN-POLYMYXIN OINTMENT TUBE
TOPICAL_OINTMENT | CUTANEOUS | Status: DC | PRN
  Administered 2011-02-24: 04:00:00 via TOPICAL
  Filled 2011-02-24: qty 15

## 2011-02-24 MED ORDER — HYDRALAZINE HCL 50 MG PO TABS
50.0000 mg | ORAL_TABLET | ORAL | Status: DC
  Administered 2011-02-25: 50 mg via ORAL
  Filled 2011-02-24 (×2): qty 1

## 2011-02-24 MED ORDER — PARICALCITOL 5 MCG/ML IV SOLN
1.0000 ug | INTRAVENOUS | Status: DC
  Administered 2011-02-24 – 2011-02-25 (×2): 1 ug via INTRAVENOUS
  Filled 2011-02-24 (×2): qty 0.2

## 2011-02-24 MED ORDER — ALTEPLASE 2 MG IJ SOLR: 2.0000 mg | Freq: Once | INTRAMUSCULAR | Status: AC | PRN

## 2011-02-24 MED ORDER — LIDOCAINE-PRILOCAINE 2.5-2.5 % EX CREA: 1.0000 "application " | TOPICAL_CREAM | CUTANEOUS | Status: DC | PRN

## 2011-02-24 MED ORDER — HYDRALAZINE HCL 50 MG PO TABS
50.0000 mg | ORAL_TABLET | ORAL | Status: DC
  Filled 2011-02-24 (×6): qty 1

## 2011-02-24 MED ORDER — LIDOCAINE HCL (PF) 1 % IJ SOLN: 5.0000 mL | INTRAMUSCULAR | Status: DC | PRN

## 2011-02-24 NOTE — Progress Notes (Signed)
   CARE MANAGEMENT NOTE 02/24/2011  Patient:  Alexandra [name], Alexandra [name]   Account Number:  [id number]  Date Initiated:  02/24/2011  Documentation initiated by:  Junius Creamer  Subjective/Objective Assessment:   adm w htn, hemodialysis     Action/Plan:   lives alone, pcp dr Gildardo Cranker, needs new dialysis center, md req we ck w [facility]-have call into them to see what they will need faxed to consider pt but no call back yet   Anticipated DC Date:  02/26/2011   Anticipated DC Plan:  HOME/SELF CARE      DC Planning Services  CM consult      Choice offered to / List presented to:             Status of service:   Medicare Important Message given?   (If response is "NO", the following Medicare IM given date fields will be blank) Date Medicare IM given:   Date Additional Medicare IM given:    Discharge Disposition:    Per UR Regulation:  Reviewed for med. necessity/level of care/duration of stay  Comments:  12/12  spoke w pt, she states she and dr frazier would like to start w dr Rosanne Sack in [place] and then search surrounding areas if no placed in [place]. will get paperwork together that dialysis center needs for dr befacato to look at to see if he can take pt, debbie Lennell Shanks rn,bsn [phone]

## 2011-02-24 NOTE — Progress Notes (Signed)
Phone calls made to the following [facility] centers to gain information related to getting patient transferred to another center at discharge:  - Flora Lipps [phone]) left message for center administrator Arlene Weatherford to return call.  Nolon Lennert [phone]) Administrator is Thea Alken spoke with a nurse at the center who stated that in order to be transferred to this center, the following documents must be faxed to the center at [phone]. Must fax: Long/short term care plans from the HD center, SW/Dietician notes, current H/P, hepatitis B&C panels (within 30 days), Current TB skin test within (30 days), current labs, current treatment sheets. Medical director is Dr. Roxy Cedar and will have to approve any new patients. Spoke to a nurse at the center who stated they are accepting patients.   Leanor Kail [phone]) Administrator is Ellwood Dense and Wellsite geologist is Dr. Roxy Cedar.  Will notify SW of information obtained. Karthika Glasper, Charlyne Quale

## 2011-02-24 NOTE — Consult Note (Signed)
Pt for dialysis tomorrow.

## 2011-02-24 NOTE — Progress Notes (Signed)
   CARE MANAGEMENT NOTE 02/24/2011  Patient:  Alexandra [name], Alexandra [name]   Account Number:  [id number]  Date Initiated:  02/24/2011  Documentation initiated by:  Junius Creamer  Subjective/Objective Assessment:   adm w htn, hemodialysis     Action/Plan:   lives alone, pcp dr Gildardo Cranker, needs new dialysis center, md req we ck [facility]-have call into them to see what they will need faxed to consider pt but no call back yet   Anticipated DC Date:  02/26/2011   Anticipated DC Plan:  HOME/SELF CARE      DC Planning Services  CM consult      Choice offered to / List presented to:             Status of service:   Medicare Important Message given?   (If response is "NO", the following Medicare IM given date fields will be blank) Date Medicare IM given:   Date Additional Medicare IM given:    Discharge Disposition:    Per UR Regulation:  Reviewed for med. necessity/level of care/duration of stay  Comments:  12/12 debbie Sherleen Pangborn rn,bsn [phone]

## 2011-02-24 NOTE — Progress Notes (Signed)
02/24/11 0430 Patient returned from HD stating she was ready to be discharged. Dr.Doutova notified, but refused to write discharge orders at this time. Stated patient will need to wait until after 7am to be discharged. Patient called her daughter and is attempting to solve the situation herself. States she needs to be discharged immediately. Patient was informed about the consequences of leaving AMA. Patient is continuing to discuss the situation with her daughter. Will continue to assess and monitor.  C.Krystopher Kuenzel, RN.

## 2011-02-24 NOTE — Progress Notes (Signed)
Subjective: Demands to leave.   Physical Exam: Blood pressure 164/70, pulse 81, temperature 97.4 F (36.3 C), temperature source Oral, resp. rate 20, height 5\' 8"  (1.727 m), weight 59.4 kg (130 lb 15.3 oz), SpO2 99.00%. Alert and oriented No apparent distress   Investigations:  Recent Results (from the past 240 hour(s))  MRSA PCR SCREENING     Status: Normal   Collection Time   02/23/11  8:42 PM      Component Value Range Status Comment   MRSA by PCR NEGATIVE  NEGATIVE  Final      Basic Metabolic Panel:  Basename 02/23/11 2335  NA 142  K 4.6  CL 103  CO2 27  GLUCOSE 105*  BUN 65*  CREATININE 9.77*  CALCIUM 10.1  MG 2.6*  PHOS 2.5   Liver Function Tests: No results found for this basename: AST:2,ALT:2,ALKPHOS:2,BILITOT:2,PROT:2,ALBUMIN:2 in the last 72 hours   CBC:  Basename 02/23/11 2335  WBC 7.1  NEUTROABS 3.4  HGB 8.6*  HCT 26.2*  MCV 91.0  PLT 153    No results found.    Medications: I have reviewed the patient's current medications.  Impression:  Active Problems:  End stage renal disease  HTN (hypertension)  DM (diabetes mellitus)  Hyperparathyroidism, secondary renal  Hyperlipidemia  Anemia of chronic disease     Plan: Await arrangements for dialysis as outpatient. No change in treatment plans.      LOS: 1 day   Quin Mcpherson Pager [phone]  02/24/2011, 1:47 PM

## 2011-02-24 NOTE — Progress Notes (Signed)
02/24/11 Nursing Patient is done talking to her daughter. States she will talk to Dr. Bascom Levels in the morning and hopes to be discharged as soon as possible. States she does not want to leave AMA because she does not want that "on her record." Patient is currently comfortable, stable and in no distress. Will continue to monitor and will notify [facility] right at 7am in the hopes of initiating the discharge process.  C.Chiquetta Langner, RN.

## 2011-02-25 ENCOUNTER — Inpatient Hospital Stay (HOSPITAL_COMMUNITY): Payer: Medicare HMO

## 2011-02-25 LAB — CBC
Hemoglobin: 8.8 g/dL — ABNORMAL LOW (ref 12.0–15.0)
MCV: 92 fL (ref 78.0–100.0)
Platelets: 186 10*3/uL (ref 150–400)
RBC: 3 MIL/uL — ABNORMAL LOW (ref 3.87–5.11)
WBC: 6.7 10*3/uL (ref 4.0–10.5)

## 2011-02-25 LAB — DIFFERENTIAL
Eosinophils Relative: 3 % (ref 0–5)
Lymphocytes Relative: 35 % (ref 12–46)
Lymphs Abs: 2.3 10*3/uL (ref 0.7–4.0)

## 2011-02-25 LAB — HEPATITIS B SURFACE ANTIGEN: Hepatitis B Surface Ag: NEGATIVE

## 2011-02-25 MED ORDER — PARICALCITOL 5 MCG/ML IV SOLN
INTRAVENOUS | Status: AC
  Administered 2011-02-25: 1 ug via INTRAVENOUS
  Filled 2011-02-25: qty 1

## 2011-02-25 NOTE — Progress Notes (Signed)
Subjective: Awaiting outpatient dialysis arrangements. [facility] will not take her back. [facility] refused as well.   Physical Exam: Blood pressure 171/72, pulse 88, temperature 98 F (36.7 C), temperature source Oral, resp. rate 20, height 5\' 8"  (1.727 m), weight 57.6 kg (126 lb 15.8 oz), SpO2 98.00%. Alert and oriented No apparent distress   Investigations:  Recent Results (from the past 240 hour(s))  MRSA PCR SCREENING     Status: Normal   Collection Time   02/23/11  8:42 PM      Component Value Range Status Comment   MRSA by PCR NEGATIVE  NEGATIVE  Final      Basic Metabolic Panel:  Basename 02/23/11 2335  NA 142  K 4.6  CL 103  CO2 27  GLUCOSE 105*  BUN 65*  CREATININE 9.77*  CALCIUM 10.1  MG 2.6*  PHOS 2.5   Liver Function Tests: No results found for this basename: AST:2,ALT:2,ALKPHOS:2,BILITOT:2,PROT:2,ALBUMIN:2 in the last 72 hours   CBC:  Basename 02/25/11 0724 02/23/11 2335  WBC 6.7 7.1  NEUTROABS 3.3 3.4  HGB 8.8* 8.6*  HCT 27.6* 26.2*  MCV 92.0 91.0  PLT 186 153    No results found.    Medications: I have reviewed the patient's current medications.  Impression:  Active Problems:  End stage renal disease  HTN (hypertension)  DM (diabetes mellitus)  Hyperparathyroidism, secondary renal  Hyperlipidemia  Anemia of chronic disease     Plan: Await arrangements for dialysis as outpatient. No change in treatment plans.      LOS: 2 days   Benaiah Behan Pager [phone]  02/25/2011, 5:54 PM

## 2011-02-25 NOTE — Progress Notes (Signed)
Pt had req to look over paperwork from [place] prior to faxing to new dialysis centers. Pt looked over paperwork around 5pm on 12/12 and it was ok to fax. Have faxed inform to dr Radford Pax and [facility] and to [facility] w pt approval.

## 2011-02-25 NOTE — Progress Notes (Signed)
Subjective: Interval History: none.  Objective: Vital signs in last 24 hours: Temp:  [97.3 F (36.3 C)-97.9 F (36.6 C)] 97.8 F (36.6 C) (12/13 0634) Pulse Rate:  [70-87] 80  (12/13 1000) Resp:  [18-20] 18  (12/13 0634) BP: (123-193)/(47-78) 158/66 mmHg (12/13 1000) SpO2:  [97 %-98 %] 98 % (12/13 0634) Weight:  [59.5 kg (131 lb 2.8 oz)] 131 lb 2.8 oz (59.5 kg) (12/13 0634) Weight change: -0.05 kg (-1.8 oz)  Intake/Output from previous day:   Intake/Output this shift:    General appearance: cooperative  Lab Results:  Basename 02/25/11 0724 02/23/11 2335  WBC 6.7 7.1  HGB 8.8* 8.6*  HCT 27.6* 26.2*  PLT 186 153   BMET:  Basename 02/23/11 2335  NA 142  K 4.6  CL 103  CO2 27  GLUCOSE 105*  BUN 65*  CREATININE 9.77*  CALCIUM 10.1   No results found for this basename: PTH:2 in the last 72 hours Iron Studies: No results found for this basename: IRON,TIBC,TRANSFERRIN,FERRITIN in the last 72 hours  Studies/Results: No results found.  I have reviewed the patient's current medications.  Assessment/Plan: Prepare for outpatient follow.    LOS: 2 days   Jeri Cos E 02/25/2011,10:06 AM

## 2011-02-25 NOTE — Progress Notes (Signed)
Called Nolberto Hanlon Office manager @[phone] (admissions coordinator for [facility]). Stated that she called the patient to gather more information regarding the need to transfer to the [facility] center. Stated the pateint was "offensive" via phone, and informed Liborio Nixon that Dr. Bascom Levels would be contacting Dr Lowell Guitar regarding getting her reinstated at another center.  Stated that the centers which she represents ([facility], [place], [place], [place]) will not accept this patient into their centers  Spoke with Dr. Bascom Levels via phone to make him aware. Stated that he has not worked out any details with Dr. Lowell Guitar, and that the patient will have to have a psychiatric evaluation prior to being admitted to any [facility] center. Patient currently refusing Psychiatric evaluation.  Dr. Lavera Guise made aware of the above. Also researching additional centers in the nearby and surrounding areas.   Madicyn Mesina, Charlyne Quale

## 2011-02-26 LAB — GLUCOSE, CAPILLARY
Glucose-Capillary: 111 mg/dL — ABNORMAL HIGH (ref 70–99)
Glucose-Capillary: 125 mg/dL — ABNORMAL HIGH (ref 70–99)
Glucose-Capillary: 144 mg/dL — ABNORMAL HIGH (ref 70–99)
Glucose-Capillary: 151 mg/dL — ABNORMAL HIGH (ref 70–99)
Glucose-Capillary: 77 mg/dL (ref 70–99)
Glucose-Capillary: 91 mg/dL (ref 70–99)

## 2011-02-26 NOTE — Progress Notes (Addendum)
i have discussed the problem that this patient has with lack of an hd center with the case manager and dr Lavera Guise, dr frazier and the patient.  In speaking with the office of dr befakdu (the md with [facility] in this area) - the practice stated that they will not accept this patient d/t a phone conversation between their office manager and the patient. The patient stated that the phone conversation in question was a problem with her because the person did not state who she was clearly and started asking personal questions that caught the patient off guard.  Regardless,   I received permission from the patient to pursue a search of other hd centers in this state that may be able to accept this patient.  I contacted a representative of [employer] [facility] [employer] - an hd company with nearby centers and gave basic information with the patient's permission.  Dr Bascom Levels did mention that Dr Lowell Guitar at a local [facility] center would consider taking this patient if she had three mandatory conditions - the patient have a avg or fistula that is maturing to be used at a later date, that the patient can get transportation to and from that center, and that the patient have a psych eval - the patient is able to do everything except she would not do a psych eval.  In speaking with the hd center directed by dr Lowell Guitar they stated they would NOT accept this patient unless a psych eval is completed and therefore would not accept this patient to their center.  Dr Lavera Guise and Dr Bascom Levels made aware of all of the above along with the case manager - in attempts to keep them informed.  Will continue to pursue other hd centers tomorrow.

## 2011-02-26 NOTE — Progress Notes (Signed)
CBG:64  Treatment: 15 GM carbohydrate snack  Symptoms: Sweaty  Follow-up CBG: [id number] CBG Result:111  Possible Reasons for Event: Inadequate meal intake  Comments/MD notified:    Carmin Muskrat

## 2011-02-26 NOTE — Progress Notes (Signed)
In follow-up about this patient's need for an hd center, patient did ask to speak with dr Boris Sharper herself to try to clear the situation therefore I attempted to reach this md on the patient's behalf without any success.  Dr Bascom Levels did manage to get in touch with dr Boris Sharper and tried to have dr Boris Sharper speak with the patient directly, also without success.  Basically, out of all the hd centers in the state are run by four individual companies.  One company is [facility] (the original hd center that the patient had to leave from), [facility] - is under the direction of dr Boris Sharper that also includes hd centers in [place], [place], [place], and [place] - who d/t being under the direction of dr Boris Sharper was also no longer an option because dr Boris Sharper did not want to accept her as a patient.  [employer] [facility] is another company for hd centers that are connected with [facility], [facility] and [facility] in [place] - all of which was denied after review of the patient's record.  I spoke at length with Su Hoff - one of the people who handle patients who transfer centers in the [facility] hd centers and she stated that the patient was denied by them and the md's individual nephrology groups and they are not required to give a reason for denial.  I did ask for an md from their company to call and speak with the patient themselves, but this also was not a request that was able to be fulfilled, I was however, able to let their staff know that while this patient had a problem at a [facility] center - she has had several hd treatments here at [place] by [facility] employees without an incident of any kind - the result was still a denial.  The fourth company that handles hd in this state is run through [facility] in [place] which is located about 2 hours one direction from where the patient presently lives.  While discussing this option with  the patient she stated that it is impossible for her to drive 2 hours one way to an hd treatment and 2 hours to return home, therefore the patient did not want me to pursue contacting anyone from that center.  Given all of this information, dr Lavera Guise and dr Bascom Levels were made aware of the final decisions of the outpatient centers and discussed with the patient the plan of doing hd in the am here at [facility] and d/c after hd with a particular plan in place to be doing follow up future hd to take place by the patient going to the [facility] on hd days (or more frequently if in physical distress) and have an hd treatment done here at [place] with the plan to continue doing the same d/t the denials of the other hd centers.  This patient has expressed frustration with this situation but has not in any way been threatening harm of herself or others during all of this process.  This was also relayed to the centers in the hopes of getting an outpatient hd spot.  The case manager for dr Lavera Guise was made aware of all of the efforts and of the current plan and she will continue to follow the patient for possible needs that may arise.

## 2011-02-26 NOTE — Progress Notes (Signed)
Utilization Review Completed.Alexandra [name] T12/14/2012   

## 2011-02-26 NOTE — Consult Note (Signed)
Pt has a headache. bp 163/82. PLAN dialysis tomorrow pt is on cozaar 50mg  po would like to give extra 50mg  this pm. I will .dialysis orders for tomorrow.  See orders.

## 2011-02-26 NOTE — Progress Notes (Signed)
Subjective: Awaiting outpatient dialysis arrangements. [facility] will not take her back. [facility] refused as well. Other places e.g. [facility], [facility], [place] are refusing one after the other.   Physical Exam: Blood pressure 163/63, pulse 74, temperature 98.3 F (36.8 C), temperature source Oral, resp. rate 18, height 5\' 8"  (1.727 m), weight 59.875 kg (132 lb), SpO2 99.00%. Alert and oriented No apparent distress She is calm cooperative not displaying any delusional activity She has no hallucinations She has not done any aggressive gestures    Recent Results (from the past 240 hour(s))  MRSA PCR SCREENING     Status: Normal   Collection Time   02/23/11  8:42 PM      Component Value Range Status Comment   MRSA by PCR NEGATIVE  NEGATIVE  Final      Basic Metabolic Panel:  Basename 02/23/11 2335  NA 142  K 4.6  CL 103  CO2 27  GLUCOSE 105*  BUN 65*  CREATININE 9.77*  CALCIUM 10.1  MG 2.6*  PHOS 2.5   Liver Function Tests: No results found for this basename: AST:2,ALT:2,ALKPHOS:2,BILITOT:2,PROT:2,ALBUMIN:2 in the last 72 hours   CBC:  Basename 02/25/11 0724 02/23/11 2335  WBC 6.7 7.1  NEUTROABS 3.3 3.4  HGB 8.8* 8.6*  HCT 27.6* 26.2*  MCV 92.0 91.0  PLT 186 153    No results found.    Medications: I have reviewed the patient's current medications.  Impression:  Active Problems:  End stage renal disease  HTN (hypertension)  DM (diabetes mellitus)  Hyperparathyroidism, secondary renal  Hyperlipidemia  Anemia of chronic disease     Plan: She will be dialyzed again tomorrow Probably she will get discharged tomorrow without the place to dialyze due to the fact that everyone is refusing to provide outpatient dialysis.       LOS: 3 days   Ginevra Tacker Pager [phone]  02/26/2011, 5:27 PM

## 2011-02-27 ENCOUNTER — Observation Stay (HOSPITAL_COMMUNITY): Payer: Medicare HMO

## 2011-02-27 LAB — GLUCOSE, CAPILLARY
Glucose-Capillary: 121 mg/dL — ABNORMAL HIGH (ref 70–99)
Glucose-Capillary: 79 mg/dL (ref 70–99)
Glucose-Capillary: 87 mg/dL (ref 70–99)

## 2011-02-27 LAB — URIC ACID: Uric Acid, Serum: 4.6 mg/dL (ref 2.4–7.0)

## 2011-02-27 LAB — CBC
MCHC: 32.3 g/dL (ref 30.0–36.0)
RDW: 19.7 % — ABNORMAL HIGH (ref 11.5–15.5)
WBC: 7.4 10*3/uL (ref 4.0–10.5)

## 2011-02-27 LAB — RENAL FUNCTION PANEL
Albumin: 3.2 g/dL — ABNORMAL LOW (ref 3.5–5.2)
Calcium: 9.9 mg/dL (ref 8.4–10.5)
GFR calc Af Amer: 7 mL/min — ABNORMAL LOW (ref >90)
GFR calc non Af Amer: 6 mL/min — ABNORMAL LOW (ref >90)
Phosphorus: 2.9 mg/dL (ref 2.3–4.6)
Sodium: 137 mEq/L (ref 135–145)

## 2011-02-27 MED ORDER — DARBEPOETIN ALFA-POLYSORBATE 200 MCG/0.4ML IJ SOLN
INTRAMUSCULAR | Status: AC
  Administered 2011-02-27: 200 ug via INTRAVENOUS
  Filled 2011-02-27: qty 0.4

## 2011-02-27 MED ORDER — PARICALCITOL 5 MCG/ML IV SOLN
1.0000 ug | INTRAVENOUS | Status: DC
  Administered 2011-02-27: 1 ug via INTRAVENOUS
  Filled 2011-02-27: qty 0.2

## 2011-02-27 MED ORDER — PARICALCITOL 5 MCG/ML IV SOLN
1.0000 ug | INTRAVENOUS | Status: DC
  Filled 2011-02-27: qty 0.2

## 2011-02-27 MED ORDER — PARICALCITOL 5 MCG/ML IV SOLN
INTRAVENOUS | Status: AC
  Administered 2011-02-27: 1 ug via INTRAVENOUS
  Filled 2011-02-27: qty 1

## 2011-02-27 MED ORDER — DARBEPOETIN ALFA-POLYSORBATE 200 MCG/0.4ML IJ SOLN
200.0000 ug | INTRAMUSCULAR | Status: DC
  Administered 2011-02-27: 200 ug via INTRAVENOUS

## 2011-02-27 NOTE — Discharge Summary (Signed)
Physician Discharge Summary  Patient ID: Alexandra [name] MRN: [id number] DOB/AGE: [birth year] 65 y.o.  Admit date: 02/23/2011 Discharge date: 02/27/2011  Primary Care Physician:  [name], MD  Discharge Diagnoses:   Present on Admission:  .HTN (hypertension) .DM (diabetes mellitus) .Hyperparathyroidism, secondary renal .Hyperlipidemia .End stage renal disease .Anemia of chronic disease  Consults:  Neurology, Dr. Jeri Cos   Discharge Medications: Current Discharge Medication List    CONTINUE these medications which have NOT CHANGED   Details  allopurinol (ZYLOPRIM) 100 MG tablet Take 100 mg by mouth daily.      amLODipine (NORVASC) 5 MG tablet Take 5 mg by mouth daily.     calcium acetate (PHOSLO) 667 MG capsule Take 667 mg by mouth. Take 2 tabs three times / day w/ meals     Coenzyme Q10 150 MG CAPS Take 150 mg by mouth. Take 2 tabs daily     docusate sodium (COLACE) 100 MG capsule Take 200 mg by mouth at bedtime.      hydrALAZINE (APRESOLINE) 25 MG tablet Take 25 mg by mouth 2 (two) times daily. 3 tablets tid    l-methylfolate-B6-B12 (METANX) 3-35-2 MG TABS Take 1 tablet by mouth 2 (two) times daily.     losartan (COZAAR) 50 MG tablet Take 50 mg by mouth daily.      metoprolol (TOPROL-XL) 50 MG 24 hr tablet Take 50 mg by mouth daily.      multivitamin (RENA-VIT) TABS tablet Take 1 tablet by mouth daily.      repaglinide (PRANDIN) 0.5 MG tablet Take 0.5 mg by mouth 3 (three) times daily before meals.      colchicine 0.6 MG tablet Take 0.6 mg by mouth daily as needed. As needed for pain.         Brief H and P: For complete details please refer to admission H and P, but in brief patient is a 65 year old female with past medical history significant for hypertension diabetes, hyperlipidemia, end-stage renal disease came to [facility] emergency room to receive the hemodialysis. Apparently patient was discharged from her outpatient dialysis  center due to some "conflict".  Hospital Course:  Active Problems:  End stage renal disease:  - Patient was admitted by the tried hospitalist service for recommendations from Dr. Jeri Cos, renal service. Patient was dialyzed in the hospital, attempts were made by the case management for arranging the hemodialysis center outpatient for the patient.Multiple outpatient hemodialysis center as were contacted and refused to take the patient for hemodialysis. Patient demanded to be discharged, I contacted Dr. Earley Abide, who stated that patient has signed a form to return back to the emergency room on Tuesday for hemodialysis which was verified by the R.N., Judeth Cornfield. Dr Bascom Levels recommended that attempts will be made again to arrange the outpatient dialysis when she is admitted on Tuesday for the dialysis. Patient was discharged home per Dr. Janey Greaser (renal) recommendations and given instructions to return back on Tuesday or any symptoms of chest pain, shortness of breath, congestion.   HTN (hypertension): Continue home medications  DM (diabetes mellitus): Continue home medications  Hyperparathyroidism, secondary renal: Followed by renal service    Day of Discharge BP 154/61  Pulse 81  Temp(Src) 97.5 F (36.4 C) (Oral)  Resp 17  Ht 5\' 8"  (1.727 m)  Wt 58.9 kg (129 lb 13.6 oz)  BMI 19.74 kg/m2  SpO2 99%  Physical Exam: General: Alert and awake oriented x3 not in any acute distress. HEENT: anicteric sclera, pupils  reactive to light and accommodation CVS: S1-S2 clear no murmur rubs or gallops Chest: clear to auscultation bilaterally, no wheezing rales or rhonchi Abdomen: soft nontender, nondistended, normal bowel sounds, no organomegaly Extremities: no cyanosis, clubbing or edema noted bilaterally Neuro: Cranial nerves II-XII intact, no focal neurological deficits   The results of significant diagnostics from this hospitalization (including imaging, microbiology, ancillary and  laboratory) are listed below for reference.    LAB RESULTS: Basic Metabolic Panel:  Lab 02/27/11 2956 02/23/11 2335  NA 137 142  K 3.9 4.6  CL 98 103  CO2 25 27  GLUCOSE 89 105*  BUN 47* 65*  CREATININE 6.61* 9.77*  CALCIUM 9.9 10.1  MG -- 2.6*  PHOS 2.9 --   Liver Function Tests:  Lab 02/27/11 0743  AST --  ALT --  ALKPHOS --  BILITOT --  PROT --  ALBUMIN 3.2*   CBC:  Lab 02/27/11 0743 02/25/11 0724  WBC 7.4 6.7  NEUTROABS -- 3.3  HGB 9.3* 8.8*  HCT 28.8* 27.6*  MCV 93.8 --  PLT 192 186   CBG:  Lab 02/27/11 1347 02/27/11 1233  GLUCAP 202* 87    Significant Diagnostic Studies:  No results found.   Disposition and Follow-up: Discharge Orders    Future Orders Please Complete By Expires   Diet renal 60/70-04-16-1198      Diet Carb Modified      Increase activity slowly      Increase activity slowly      Discharge instructions      Comments:   Please return to the [facility] emergency room on Tuesday for hemodialysis per Dr. Inez Pilgrim recommendations       DISPOSITION: Home DIET: Renal, carb modified diet ACTIVITY: As tolerated  DISCHARGE FOLLOW-UP Follow-up Information          Follow up with Jarome Matin, MD in 3 days.   Contact information:   Individual [address] [place] [address] [phone]          Time spent on Discharge: 45 minutes  Signed: RAI,RIPUDEEP 02/27/2011, 2:17 PM

## 2011-02-27 NOTE — Discharge Summary (Signed)
Pt dcd after hemodialysis. Pt given dc instructions, verbalized understanding and will be transported home with support.Marland Kitchen

## 2011-02-27 NOTE — Consults (Signed)
Pt. In dial[ysis. No problem. Still atempting to find an outpatient unit.

## 2011-03-01 NOTE — Progress Notes (Signed)
   CARE MANAGEMENT NOTE 03/01/2011  Patient:  Alexandra [name], Alexandra [name]   Account Number:  [id number]  Date Initiated:  02/24/2011  Documentation initiated by:  Junius Creamer  Subjective/Objective Assessment:   adm w htn, hemodialysis     Action/Plan:   lives alone, pcp dr Gildardo Cranker, needs new dialysis center, md req we ck w [facility]-have call into them to see what they will need faxed to consider pt but no call back yet   Anticipated DC Date:  03/01/2011   Anticipated DC Plan:  HOME/SELF CARE      DC Planning Services  CM consult          Status of service:  Completed, signed off  Discharge Disposition:  HOME/SELF CARE  Per UR Regulation:  Reviewed for med. necessity/level of care/duration of stay  Comments:  02/26/11 1630 Lasaro Primm RN MSN CCM Pt has been denied at all centers except with [facility] in [facility] and [facility].  Kim @ [facility] is considering pt.  She will call unit when final decision is made.  12/12  spoke w pt, she states she and dr frazier would like to start w dr Rosanne Sack in [place] and then search surrounding areas if no placed in [place]. will get paperwork together that dialysis center needs for dr befacato to look at to see if he can take pt, debbie dowell rn,bsn [phone]

## 2011-03-02 ENCOUNTER — Encounter (HOSPITAL_COMMUNITY): Payer: Self-pay | Admitting: *Deleted

## 2011-03-02 ENCOUNTER — Inpatient Hospital Stay (HOSPITAL_COMMUNITY): Payer: Medicare HMO

## 2011-03-02 ENCOUNTER — Observation Stay (HOSPITAL_COMMUNITY)
Admission: EM | Admit: 2011-03-02 | Discharge: 2011-03-03 | Disposition: A | Discharge status: Home / Self Care | Payer: Medicare HMO | Attending: Internal Medicine | Admitting: Internal Medicine

## 2011-03-02 DIAGNOSIS — I120 Hypertensive chronic kidney disease with stage 5 chronic kidney disease or end stage renal disease: Secondary | ICD-10-CM | POA: Insufficient documentation

## 2011-03-02 DIAGNOSIS — E785 Hyperlipidemia, unspecified: Secondary | ICD-10-CM | POA: Diagnosis present

## 2011-03-02 DIAGNOSIS — F17200 Nicotine dependence, unspecified, uncomplicated: Secondary | ICD-10-CM | POA: Insufficient documentation

## 2011-03-02 DIAGNOSIS — D638 Anemia in other chronic diseases classified elsewhere: Secondary | ICD-10-CM | POA: Diagnosis present

## 2011-03-02 DIAGNOSIS — N186 End stage renal disease: Secondary | ICD-10-CM | POA: Insufficient documentation

## 2011-03-02 DIAGNOSIS — E119 Type 2 diabetes mellitus without complications: Secondary | ICD-10-CM | POA: Insufficient documentation

## 2011-03-02 DIAGNOSIS — M109 Gout, unspecified: Secondary | ICD-10-CM | POA: Insufficient documentation

## 2011-03-02 DIAGNOSIS — N2581 Secondary hyperparathyroidism of renal origin: Secondary | ICD-10-CM | POA: Diagnosis present

## 2011-03-02 DIAGNOSIS — Z992 Dependence on renal dialysis: Secondary | ICD-10-CM | POA: Insufficient documentation

## 2011-03-02 DIAGNOSIS — R0602 Shortness of breath: Secondary | ICD-10-CM | POA: Insufficient documentation

## 2011-03-02 MED ORDER — DOCUSATE SODIUM 100 MG PO CAPS
200.0000 mg | ORAL_CAPSULE | Freq: Every day | ORAL | Status: DC
  Filled 2011-03-02: qty 2

## 2011-03-02 MED ORDER — RENA-VITE PO TABS
1.0000 | ORAL_TABLET | Freq: Every day | ORAL | Status: DC
  Filled 2011-03-02: qty 1

## 2011-03-02 MED ORDER — HYDRALAZINE HCL 50 MG PO TABS
75.0000 mg | ORAL_TABLET | Freq: Three times a day (TID) | ORAL | Status: DC
  Filled 2011-03-02 (×4): qty 1

## 2011-03-02 MED ORDER — L-METHYLFOLATE-B6-B12 3-35-2 MG PO TABS
1.0000 | ORAL_TABLET | Freq: Two times a day (BID) | ORAL | Status: DC
  Filled 2011-03-02 (×2): qty 1

## 2011-03-02 MED ORDER — ALLOPURINOL 100 MG PO TABS
100.0000 mg | ORAL_TABLET | Freq: Every day | ORAL | Status: DC
  Filled 2011-03-02: qty 1

## 2011-03-02 MED ORDER — ONDANSETRON HCL 4 MG/2ML IJ SOLN: 4.0000 mg | Freq: Four times a day (QID) | INTRAMUSCULAR | Status: DC | PRN

## 2011-03-02 MED ORDER — LOSARTAN POTASSIUM 50 MG PO TABS
50.0000 mg | ORAL_TABLET | Freq: Every day | ORAL | Status: DC
  Filled 2011-03-02: qty 1

## 2011-03-02 MED ORDER — HEPARIN SODIUM (PORCINE) 1000 UNIT/ML DIALYSIS
20.0000 [IU]/kg | INTRAMUSCULAR | Status: DC | PRN
  Administered 2011-03-02: 1200 [IU] via INTRAVENOUS_CENTRAL

## 2011-03-02 MED ORDER — INSULIN ASPART 100 UNIT/ML ~~LOC~~ SOLN: 3.0000 [IU] | Freq: Three times a day (TID) | SUBCUTANEOUS | Status: DC

## 2011-03-02 MED ORDER — DOCUSATE SODIUM 100 MG PO CAPS: 100.0000 mg | ORAL_CAPSULE | Freq: Two times a day (BID) | ORAL | Status: DC

## 2011-03-02 MED ORDER — ASPIRIN EC 81 MG PO TBEC
81.0000 mg | DELAYED_RELEASE_TABLET | Freq: Every day | ORAL | Status: DC
  Filled 2011-03-02: qty 1

## 2011-03-02 MED ORDER — ZOLPIDEM TARTRATE 5 MG PO TABS: 5.0000 mg | ORAL_TABLET | Freq: Every evening | ORAL | Status: DC | PRN

## 2011-03-02 MED ORDER — GENTAMICIN SULFATE 0.1 % EX OINT
TOPICAL_OINTMENT | Freq: Once | CUTANEOUS | Status: DC
  Filled 2011-03-02: qty 15

## 2011-03-02 MED ORDER — AMLODIPINE BESYLATE 5 MG PO TABS
5.0000 mg | ORAL_TABLET | Freq: Every day | ORAL | Status: DC
  Filled 2011-03-02: qty 1

## 2011-03-02 MED ORDER — REPAGLINIDE 0.5 MG PO TABS
0.5000 mg | ORAL_TABLET | Freq: Three times a day (TID) | ORAL | Status: DC
  Filled 2011-03-02 (×4): qty 1

## 2011-03-02 MED ORDER — ALUM & MAG HYDROXIDE-SIMETH 200-200-20 MG/5ML PO SUSP: 30.0000 mL | Freq: Four times a day (QID) | ORAL | Status: DC | PRN

## 2011-03-02 MED ORDER — METOPROLOL SUCCINATE ER 50 MG PO TB24
50.0000 mg | ORAL_TABLET | Freq: Every day | ORAL | Status: DC
  Filled 2011-03-02: qty 1

## 2011-03-02 MED ORDER — ONDANSETRON HCL 4 MG PO TABS: 4.0000 mg | ORAL_TABLET | Freq: Four times a day (QID) | ORAL | Status: DC | PRN

## 2011-03-02 MED ORDER — ACETAMINOPHEN 325 MG PO TABS: 650.0000 mg | ORAL_TABLET | Freq: Four times a day (QID) | ORAL | Status: DC | PRN

## 2011-03-02 MED ORDER — INSULIN ASPART 100 UNIT/ML ~~LOC~~ SOLN
0.0000 [IU] | Freq: Three times a day (TID) | SUBCUTANEOUS | Status: DC
  Filled 2011-03-02: qty 3

## 2011-03-02 MED ORDER — CALCIUM ACETATE 667 MG PO CAPS
1334.0000 mg | ORAL_CAPSULE | Freq: Three times a day (TID) | ORAL | Status: DC
Start: 2011-03-03 — End: 2011-03-03
  Filled 2011-03-02 (×4): qty 2

## 2011-03-02 MED ORDER — ACETAMINOPHEN 650 MG RE SUPP: 650.0000 mg | Freq: Four times a day (QID) | RECTAL | Status: DC | PRN

## 2011-03-02 NOTE — H&P (Addendum)
PATIENT DETAILS Name: Alexandra [name] Age: 65 y.o. Sex: female Date of Birth: [birth year] Admit Date: 03/02/2011 WUJ:WJXB,JYNWGNF ALAN, MD   CHIEF COMPLAINT:  Dialysis  HPI: 65 year old female with a history of hypertension, diabetes, tobacco abuse, end-stage renal disease since 2011, on dialysis Tuesday Thursday and Saturday being admitted to the hospital for dialysis by Dr. Bascom Levels. Unfortunately the patient has not found an outpatient dialysis center to get established yet and comes to [facility] for regular dialysis. She has no shortness of breath, chest pain or symptoms of volume overload. She states she feels great and denies any complaints. Specifically she denies pain, headache, abdominal pain, diarrhea or constipation. Denies fever, chills or night sweats.   ALLERGIES:   Allergies  Allergen Reactions  . Codeine     Passes out  . Epinephrine Other (See Comments)    Hypotension  . Percocet (Oxycodone-Acetaminophen)     Passes  out    PAST MEDICAL HISTORY: Past Medical History  Diagnosis Date  . Chronic kidney disease     esrd  . Diabetes mellitus   . Hyperlipidemia   . Hypertension   . Renal disorder   . Dialysis care     tues, thurs, sat  . Gout due to renal impairment     PAST SURGICAL HISTORY: Past Surgical History  Procedure Date  . Carotid endarterectomy 2002    left  . Btl   . Rectal fissurectomy   . Av fistula placement 10/30/2010    right brachiocephalic   . Fistulogram     MEDICATIONS AT HOME: Prior to Admission medications   Medication Sig Start Date End Date Taking? Authorizing Provider  allopurinol (ZYLOPRIM) 100 MG tablet Take 100 mg by mouth daily.     Yes Historical Provider, MD  amLODipine (NORVASC) 5 MG tablet Take 5 mg by mouth daily.    Yes Historical Provider, MD  calcium acetate (PHOSLO) 667 MG capsule Take 1,334 mg by mouth 3 (three) times daily with meals.    Yes Historical Provider, MD  Coenzyme Q10 150 MG CAPS Take 300 mg  by mouth daily.    Yes Historical Provider, MD  colchicine 0.6 MG tablet Take 0.6 mg by mouth daily as needed. for pain.   Yes Historical Provider, MD  docusate sodium (COLACE) 100 MG capsule Take 200 mg by mouth at bedtime.     Yes Historical Provider, MD  hydrALAZINE (APRESOLINE) 25 MG tablet Take 75 mg by mouth 3 (three) times daily.    Yes Historical Provider, MD  l-methylfolate-B6-B12 (METANX) 3-35-2 MG TABS Take 1 tablet by mouth 2 (two) times daily.    Yes Historical Provider, MD  losartan (COZAAR) 50 MG tablet Take 50 mg by mouth daily.     Yes Historical Provider, MD  metoprolol (TOPROL-XL) 50 MG 24 hr tablet Take 50 mg by mouth daily.     Yes Historical Provider, MD  multivitamin (RENA-VIT) TABS tablet Take 1 tablet by mouth daily.     Yes Historical Provider, MD  repaglinide (PRANDIN) 0.5 MG tablet Take 0.5 mg by mouth 3 (three) times daily before meals.     Yes Historical Provider, MD    FAMILY HISTORY: Family History  Problem Relation Age of Onset  . COPD Mother   . Kidney disease Mother   . Heart disease Father   . Lymphoma Son     SOCIAL HISTORY:  reports that she has been smoking Cigarettes.  She has a 50 pack-year smoking history. She has  never used smokeless tobacco. She reports that she does not drink alcohol or use illicit drugs.  REVIEW OF SYSTEMS:  Constitutional:   No  weight loss, night sweats,  Fevers, chills, fatigue.  HEENT:    No headaches, Difficulty swallowing,Tooth/dental problems,Sore throat,  No sneezing, itching, ear ache, nasal congestion, post nasal drip,   Cardio-vascular: No chest pain,  Orthopnea, PND, swelling in lower extremities, anasarca,         dizziness, palpitations  GI:  No heartburn, indigestion, abdominal pain, nausea, vomiting, diarrhea, change in       bowel habits, loss of appetite  Resp: No shortness of breath with exertion or at rest.  No excess mucus, no productive cough, No non-productive cough,  No coughing up of blood.No  change in color of mucus.No wheezing.No chest wall deformity  Skin:  no rash or lesions.  GU:  no dysuria, change in color of urine, no urgency or frequency.  No flank pain.  Musculoskeletal: No joint pain or swelling.  No decreased range of motion.  No back pain.  Psych: No change in mood or affect. No depression or anxiety.  No memory loss.   PHYSICAL EXAM: Blood pressure 177/56, pulse 77, temperature 97.3 F (36.3 C), temperature source Oral, resp. rate 18, SpO2 98.00%.  General appearance :Awake, alert, not in any distress. Speech Clear. Not toxic Looking HEENT: Atraumatic and Normocephalic, pupils equally reactive to light and accomodation Neck: supple, no JVD. No cervical lymphadenopathy.  Chest:Good air entry bilaterally, no added sounds. Right subclavian catheter in place CVS: S1 S2 regular, no murmurs.  Abdomen: Bowel sounds present, Non tender and not distended with no gaurding, rigidity or rebound. Extremities: B/L Lower Ext shows no edema, both legs are warm to touch, with  dorsalis pedis pulses palpable. AV fistula with a thrill on right antecubital area Neurology: Awake alert, and oriented X 3, CN II-XII intact, Non focal, Deep Tendon Reflex-2+ all over, plantar's downgoing B/L, sensory exam is grossly intact.  Skin:No Rash Wounds:N/A  LABS ON ADMISSION:  No results found for this basename: NA:2,K:2,CL:2,CO2:2,GLUCOSE:2,BUN:2,CREATININE:2,CALCIUM:2,MG:2,PHOS:2 in the last 72 hours No results found for this basename: AST:2,ALT:2,ALKPHOS:2,BILITOT:2,PROT:2,ALBUMIN:2 in the last 72 hours No results found for this basename: LIPASE:2,AMYLASE:2 in the last 72 hours No results found for this basename: WBC:2,NEUTROABS:2,HGB:2,HCT:2,MCV:2,PLT:2 in the last 72 hours No results found for this basename: CKTOTAL:3,CKMB:3,CKMBINDEX:3,TROPONINI:3 in the last 72 hours No results found for this basename: DDIMER:2 in the last 72 hours No components found with this basename:  POCBNP:3   RADIOLOGIC STUDIES ON ADMISSION: No results found.  ASSESSMENT AND PLAN: Present on Admission:  .End stage renal disease .HTN (hypertension) .DM (diabetes mellitus) .Hyperparathyroidism, secondary renal .Hyperlipidemia .Anemia of chronic disease  Admit patient to Dr. Janey Greaser service for hemodialysis Continue home medications Check BMP in the morning   Further plan will depend as patient's clinical course evolves and further radiologic and laboratory data become available. Patient will be monitored closely.   DVT Prophylaxis: Lovenox  Code Status: Full  Total time spent for admission equals 45 minutes.  Jonny Ruiz 03/02/2011, 8:38 PM

## 2011-03-02 NOTE — ED Notes (Signed)
Called Dr. Bascom Levels per RN

## 2011-03-02 NOTE — ED Notes (Signed)
Patient reports she is here for dialysis treatment.

## 2011-03-02 NOTE — Consult Note (Signed)
  Pt into er pt has chf esrd i will write dialyisis orders.

## 2011-03-02 NOTE — ED Provider Notes (Addendum)
History     CSN: [id number] Arrival date & time: 03/02/2011  3:58 PM   First MD Initiated Contact with Patient 03/02/11 1923      Chief Complaint  Patient presents with  . Shortness of Breath    (Consider location/radiation/quality/duration/timing/severity/associated sxs/prior treatment) HPI.... status post end-stage renal disease with dialysis on Tuesday, Thursday, Saturday. She dialyzes at [facility] secondary to social issues. She has minimal shortness of breath but no major complaints. No chest pain, fever, chills.  She is alert and oriented.  Past Medical History  Diagnosis Date  . Chronic kidney disease     esrd  . Diabetes mellitus   . Hyperlipidemia   . Hypertension   . Renal disorder   . Dialysis care     tues, thurs, sat  . 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 Carotid endarterectomy 2002    left  . Btl   . Rectal fissurectomy   . Av fistula placement 10/30/2010    right brachiocephalic   . Fistulogram     Family History  Problem Relation Age of Onset  . COPD Mother   . Kidney disease Mother   . Heart disease Father   . Lymphoma Son     History  Substance Use Topics  . Smoking status: Current Everyday Smoker -- 1.0 packs/day for 50 years    Types: Cigarettes  . Smokeless tobacco: Never Used  . Alcohol Use: No    OB History    Grav Para Term Preterm Abortions TAB SAB Ect Mult Living                  Review of Systems  All other systems reviewed and are negative.    Allergies  Codeine; Epinephrine; and Percocet  Home Medications   Current Outpatient Rx  Name Route Sig Dispense Refill  . ALLOPURINOL 100 MG PO TABS Oral Take 100 mg by mouth daily.      Marland Kitchen AMLODIPINE BESYLATE 5 MG PO TABS Oral Take 5 mg by mouth daily.     Marland Kitchen CALCIUM ACETATE 667 MG PO CAPS Oral Take 1,334 mg by mouth 3 (three) times daily with meals.     Marland Kitchen COENZYME Q10 150 MG PO CAPS Oral Take 300 mg by mouth daily.     .  COLCHICINE 0.6 MG PO TABS Oral Take 0.6 mg by mouth daily as needed. for pain.    Marland Kitchen DOCUSATE SODIUM 100 MG PO CAPS Oral Take 200 mg by mouth at bedtime.      Marland Kitchen HYDRALAZINE HCL 25 MG PO TABS Oral Take 75 mg by mouth 3 (three) times daily.     . L-METHYLFOLATE-B6-B12 3-35-2 MG PO TABS Oral Take 1 tablet by mouth 2 (two) times daily.     Marland Kitchen LOSARTAN POTASSIUM 50 MG PO TABS Oral Take 50 mg by mouth daily.      Marland Kitchen METOPROLOL SUCCINATE ER 50 MG PO TB24 Oral Take 50 mg by mouth daily.      Marland Kitchen RENA-VITE PO TABS Oral Take 1 tablet by mouth daily.      Marland Kitchen REPAGLINIDE 0.5 MG PO TABS Oral Take 0.5 mg by mouth 3 (three) times daily before meals.        BP 177/56  Pulse 77  Temp(Src) 97.3 F (36.3 C) (Oral)  Resp 18  SpO2 98%  Physical Exam  Nursing note and vitals reviewed. Constitutional: She is oriented to person,  place, and time. She appears well-developed and well-nourished.  Musculoskeletal: Normal range of motion.  Neurological: She is alert and oriented to person, place, and time.  Skin: Skin is warm and dry.  Psychiatric: She has a normal mood and affect.    ED Course  Procedures (including critical care time)   Labs Reviewed  BASIC METABOLIC PANEL  CBC  DIFFERENTIAL   No results found.   No diagnosis found.    MDM  Awaiting blood work. To be admitted by triad. Dr. Bascom Levels will write orders for her renal care        Donnetta Hutching, MD 03/02/11 1954  Donnetta Hutching, MD 03/24/11 [phone]

## 2011-03-02 NOTE — ED Notes (Signed)
TRH1 CALLED 

## 2011-03-02 NOTE — Consult Note (Cosign Needed)
  Alexandra [name] is an 65 y.o. female.  HPI: Pt. Had chf and needed dialysis.    Past Medical History  Diagnosis Date  . Chronic kidney disease     esrd  . Diabetes mellitus   . Hyperlipidemia   . Hypertension   . Renal disorder   . Dialysis care     tues, thurs, sat  . 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 Carotid endarterectomy 2002    left  . Btl   . Rectal fissurectomy   . Av fistula placement 10/30/2010    right brachiocephalic   . Fistulogram     Family History  Problem Relation Age of Onset  . COPD Mother   . Kidney disease Mother   . Heart disease Father   . Lymphoma Son     Social History:  reports that she has been smoking Cigarettes.  She has a 50 pack-year smoking history. She has never used smokeless tobacco. She reports that she does not drink alcohol or use illicit drugs.  Allergies:  Allergies  Allergen Reactions  . Codeine     Passes out  . Epinephrine Other (See Comments)    Hypotension  . Percocet (Oxycodone-Acetaminophen)     Passes  out    Medications: medication reviewed ok./display:[id number]}  No results found for this or any previous visit (from the past 48 hour(s)).  No results found.  Blood pressure 177/56, pulse 77, temperature 97.3 F (36.3 C), temperature source Oral, resp. rate 18, SpO2 98.00%.  Physical Exam:  General appearance: alert, cooperative and appears stated age Head: Normocephalic, without obvious abnormality, atraumatic Eyes: conjunctivae/corneas clear. PERRL, EOM's intact. Fundi benign. Ears: normal TM's and external ear canals both ears Nose: Nares normal. Septum midline. Mucosa normal. No drainage or sinus tenderness. Throat: lips, mucosa, and tongue normal; teeth and gums normal Resp:some rales Chest wall: no tenderness Cardio: regular rate and rhythm, S1, S2 normal, no murmur, click, rub or gallop GI: soft, non-tender; bowel sounds normal; no masses,  no  organomegaly Extremities: extremities normal, atraumatic, no cyanosis or edema Skin: Skin color, texture, turgor normal. No rashes or lesions Neurologic: Grossly normal  Assessment/Plan: 1. ESRD 2. HTN 3 dialysis tonight. Jeri Cos, MD 03/02/2011, 7:48 PM

## 2011-03-03 LAB — DIFFERENTIAL
Basophils Absolute: 0 10*3/uL (ref 0.0–0.1)
Eosinophils Relative: 2 % (ref 0–5)
Lymphocytes Relative: 28 % (ref 12–46)
Lymphs Abs: 2.1 10*3/uL (ref 0.7–4.0)
Monocytes Relative: 11 % (ref 3–12)

## 2011-03-03 LAB — RENAL FUNCTION PANEL
CO2: 26 mEq/L (ref 19–32)
Calcium: 10.2 mg/dL (ref 8.4–10.5)
Chloride: 99 mEq/L (ref 96–112)
Creatinine, Ser: 10.83 mg/dL — ABNORMAL HIGH (ref 0.50–1.10)
GFR calc Af Amer: 4 mL/min — ABNORMAL LOW (ref >90)
Glucose, Bld: 157 mg/dL — ABNORMAL HIGH (ref 70–99)

## 2011-03-03 LAB — CBC
Hemoglobin: 10 g/dL — ABNORMAL LOW (ref 12.0–15.0)
MCH: 30.4 pg (ref 26.0–34.0)
MCV: 93.9 fL (ref 78.0–100.0)
Platelets: 235 10*3/uL (ref 150–400)
RBC: 3.29 MIL/uL — ABNORMAL LOW (ref 3.87–5.11)
WBC: 7.5 10*3/uL (ref 4.0–10.5)

## 2011-03-03 LAB — MAGNESIUM: Magnesium: 2.7 mg/dL — ABNORMAL HIGH (ref 1.5–2.5)

## 2011-03-03 NOTE — Progress Notes (Signed)
Utilization review complete 

## 2011-03-04 ENCOUNTER — Encounter (HOSPITAL_COMMUNITY): Payer: Self-pay

## 2011-03-04 ENCOUNTER — Observation Stay (HOSPITAL_COMMUNITY)
Admission: RE | Admit: 2011-03-04 | Discharge: 2011-03-04 | Disposition: A | Discharge status: Home / Self Care | Payer: Medicare HMO | Source: Ambulatory Visit

## 2011-03-04 ENCOUNTER — Observation Stay (HOSPITAL_COMMUNITY)
Admission: EM | Admit: 2011-03-04 | Discharge: 2011-03-05 | Disposition: A | Discharge status: Home / Self Care | Payer: Medicare HMO | Attending: Emergency Medicine | Admitting: Emergency Medicine

## 2011-03-04 DIAGNOSIS — E119 Type 2 diabetes mellitus without complications: Secondary | ICD-10-CM | POA: Insufficient documentation

## 2011-03-04 DIAGNOSIS — E785 Hyperlipidemia, unspecified: Secondary | ICD-10-CM | POA: Insufficient documentation

## 2011-03-04 DIAGNOSIS — I120 Hypertensive chronic kidney disease with stage 5 chronic kidney disease or end stage renal disease: Secondary | ICD-10-CM | POA: Insufficient documentation

## 2011-03-04 DIAGNOSIS — N186 End stage renal disease: Secondary | ICD-10-CM | POA: Insufficient documentation

## 2011-03-04 DIAGNOSIS — Z992 Dependence on renal dialysis: Principal | ICD-10-CM | POA: Insufficient documentation

## 2011-03-04 LAB — CBC
Hemoglobin: 10.4 g/dL — ABNORMAL LOW (ref 12.0–15.0)
MCHC: 30.2 g/dL (ref 30.0–36.0)
Platelets: 216 10*3/uL (ref 150–400)
RDW: 22.5 % — ABNORMAL HIGH (ref 11.5–15.5)

## 2011-03-04 LAB — DIFFERENTIAL
Basophils Absolute: 0 10*3/uL (ref 0.0–0.1)
Basophils Relative: 0 % (ref 0–1)
Eosinophils Absolute: 0.1 10*3/uL (ref 0.0–0.7)
Neutro Abs: 3.5 10*3/uL (ref 1.7–7.7)
Neutrophils Relative %: 55 % (ref 43–77)

## 2011-03-04 LAB — RENAL FUNCTION PANEL
Albumin: 3.1 g/dL — ABNORMAL LOW (ref 3.5–5.2)
GFR calc Af Amer: 7 mL/min — ABNORMAL LOW (ref >90)
GFR calc non Af Amer: 6 mL/min — ABNORMAL LOW (ref >90)
Glucose, Bld: 171 mg/dL — ABNORMAL HIGH (ref 70–99)
Phosphorus: 1.7 mg/dL — ABNORMAL LOW (ref 2.3–4.6)
Potassium: 3.8 mEq/L (ref 3.5–5.1)
Sodium: 140 mEq/L (ref 135–145)

## 2011-03-04 LAB — URIC ACID: Uric Acid, Serum: 4.6 mg/dL (ref 2.4–7.0)

## 2011-03-04 MED ORDER — PARICALCITOL 5 MCG/ML IV SOLN
INTRAVENOUS | Status: AC
  Administered 2011-03-04: 1 ug via INTRAVENOUS
  Filled 2011-03-04: qty 1

## 2011-03-04 MED ORDER — PARICALCITOL 5 MCG/ML IV SOLN
1.0000 ug | INTRAVENOUS | Status: DC
  Filled 2011-03-04: qty 0.2

## 2011-03-04 MED ORDER — ALBUMIN HUMAN 25 % IV SOLN
12.5000 g | Freq: Once | INTRAVENOUS | Status: DC
  Filled 2011-03-04: qty 50

## 2011-03-04 MED ORDER — HEPARIN SODIUM (PORCINE) 1000 UNIT/ML DIALYSIS: 100.0000 [IU]/kg | INTRAMUSCULAR | Status: DC | PRN

## 2011-03-04 NOTE — ED Notes (Signed)
Spoke to dialysis nurse, pt is in line for dialysis at this time. Will continue to monitor pt

## 2011-03-04 NOTE — ED Notes (Signed)
States no c/o just here for dialysis and is to meet her doctor here

## 2011-03-04 NOTE — Consult Note (Signed)
Nephrology note.  Pt. Is a 65yo lady .dialysis orders written.see orders.

## 2011-03-04 NOTE — ED Notes (Signed)
Diet tray ordered 

## 2011-03-04 NOTE — H&P (Signed)
Alexandra [name] MRN: [id number] DOB/AGE: [birth year] 67 y.o. 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 Patient unfortunately is in the process of finding out patient dialysis center to get established and a such presents to [facility] for her dialysis. Patient was recently admitted 2 days prior to admission on 03/02/2011 for her dialysis. Patient denies any chest pain, no shortness of breath, no fever, no nausea, no diarrhea, no constipation, no cough, no weakness, no abdominal pain, no other associated symptoms. ED physician had spoken with Dr. Bascom Levels who states he will consult on the patient and a such triad hospitalists were asked to admit the patient.  Past Medical History  Diagnosis Date  . Chronic kidney disease     esrd  . Diabetes mellitus   . Hyperlipidemia   . Hypertension   . Renal disorder   . Dialysis care     tues, thurs, sat  . 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 Carotid endarterectomy 2002    left  . Btl   . Rectal fissurectomy   . Av fistula placement 10/30/2010    right brachiocephalic   . Fistulogram     Prior to Admission medications   Medication Sig Start Date End Date Taking? Authorizing Provider  allopurinol (ZYLOPRIM) 100 MG tablet Take 100 mg by mouth daily.     Yes Historical Provider, MD  amLODipine (NORVASC) 5 MG tablet Take 5 mg by mouth daily.    Yes Historical Provider, MD  calcium acetate (PHOSLO) 667 MG capsule Take 1,334 mg by mouth 3 (three) times daily with meals.    Yes Historical Provider, MD  Coenzyme Q10 150 MG CAPS Take 300 mg by mouth daily.    Yes Historical  Provider, MD  colchicine 0.6 MG tablet Take 0.6 mg by mouth daily as needed. For gout.   Yes Historical Provider, MD  docusate sodium (COLACE) 100 MG capsule Take 200 mg by mouth at bedtime.     Yes Historical Provider, MD  hydrALAZINE (APRESOLINE) 25 MG tablet Take 75 mg by mouth 3 (three) times daily.    Yes Historical Provider, MD  l-methylfolate-B6-B12 (METANX) 3-35-2 MG TABS Take 1 tablet by mouth 2 (two) times daily.    Yes Historical Provider, MD  losartan (COZAAR) 50 MG tablet Take 50 mg by mouth daily.     Yes Historical Provider, MD  metoprolol (TOPROL-XL) 50 MG 24 hr tablet Take 50 mg by mouth daily.     Yes Historical Provider, MD  multivitamin (RENA-VIT) TABS tablet Take 1 tablet by mouth daily.     Yes Historical Provider, MD  repaglinide (PRANDIN) 0.5 MG tablet Take 0.5 mg by mouth 3 (three) times daily before meals.     Yes Historical Provider, MD    Allergies:  Allergies  Allergen Reactions  . Codeine Other (See Comments)    Passes out  . Epinephrine Other (See Comments)    Tachycardia, diaphoresis, syncope  . Percocet (Oxycodone-Acetaminophen) Other (See Comments)    Passes  out    Family History  Problem Relation Age of Onset  . COPD Mother   . Kidney disease Mother   . Heart disease Father   .  Lymphoma Son     Social History:  reports that she has been smoking Cigarettes.  She has a 50 pack-year smoking history. She has never used smokeless tobacco. She reports that she does not drink alcohol or use illicit drugs.  ROS: All systems reviewed with the patient and was positive as per HPI otherwise all other systems are negative.  PHYSICAL EXAM: Blood pressure 190/67, pulse 98, temperature 97.8 F (36.6 C), temperature source Oral, resp. rate 20, SpO2 96.00%. General: Well-developed well-nourished, in no acute cardiopulmonary distress.  HEENT: Normocephalic, atraumatic. Pupils equal round and reactive to light and accommodation. Extraocular movements intact.  Oropharynx is clear no lesions no exudates. Neck is supple with no lymphadenopathy. No bruits, no goiter. Heart: Regular rate and rhythm, without murmurs, rubs, gallops. Lungs: Clear to auscultation bilaterally. Abdomen: Soft, nontender, nondistended, positive bowel sounds. Extremities: No clubbing cyanosis or edema with positive pedal pulses. Neuro: Grossly intact, nonfocal.   EKG: None  Recent Results (from the past 240 hour(s))  MRSA PCR SCREENING     Status: Normal   Collection Time   02/23/11  8:42 PM      Component Value Range Status Comment   MRSA by PCR NEGATIVE  NEGATIVE  Final      Lab results:  [facility] 03/03/11 03/02/11 2330  NA -- 140  K -- 3.9  CL -- 99  CO2 -- 26  GLUCOSE -- 157*  BUN -- 78*  CREATININE -- 10.83*  CALCIUM -- 10.2  MG 2.7* --  PHOS -- 2.4    Basename 03/02/11 2330  AST --  ALT --  ALKPHOS --  BILITOT --  PROT --  ALBUMIN 3.2*   No results found for this basename: LIPASE:2,AMYLASE:2 in the last 72 hours  Basename 03/03/11  WBC 7.5  NEUTROABS 4.4  HGB 10.0*  HCT 30.9*  MCV 93.9  PLT 235   No results found for this basename: CKTOTAL:3,CKMB:3,CKMBINDEX:3,TROPONINI:3 in the last 72 hours No components found with this basename: POCBNP:3 No results found for this basename: DDIMER in the last 72 hours No results found for this basename: HGBA1C:2 in the last 72 hours No results found for this basename: CHOL:2,HDL:2,LDLCALC:2,TRIG:2,CHOLHDL:2,LDLDIRECT:2 in the last 72 hours No results found for this basename: TSH,T4TOTAL,FREET3,T3FREE,THYROIDAB in the last 72 hours No results found for this basename: VITAMINB12:2,FOLATE:2,FERRITIN:2,TIBC:2,IRON:2,RETICCTPCT:2 in the last 72 hours Imaging results:  No results found. Impression/Plan:  Active Problems:  End stage renal disease  HTN (hypertension)  DM (diabetes mellitus)  Problem #1 end-stage renal disease on hemodialysis Patient will be admitted for hemodialysis. Dr. Bascom Levels has  been consult and will dialyze patient. Labs will be obtained during dialysis per Dr. Bascom Levels. #2 hypertension Continue home regimen of hydralazine, Norvasc, metoprolol. #3 diabetes mellitus Continue home dose Prandin. Sliding scale insulin. #4 prophylaxis Lovenox for DVT prophylaxis.   Alexandra [name] 03/04/2011, 4:54 PM

## 2011-03-04 NOTE — ED Notes (Signed)
Report given to dialysis nurse. Dialysis will call for pt after 1800 when nurse is available

## 2011-03-06 ENCOUNTER — Observation Stay (HOSPITAL_COMMUNITY)
Admission: EM | Admit: 2011-03-06 | Discharge: 2011-03-07 | Disposition: A | Discharge status: Home / Self Care | Payer: Medicare HMO | Attending: Internal Medicine | Admitting: Internal Medicine

## 2011-03-06 ENCOUNTER — Encounter (HOSPITAL_COMMUNITY): Payer: Self-pay | Admitting: Internal Medicine

## 2011-03-06 ENCOUNTER — Emergency Department (HOSPITAL_COMMUNITY)
Admission: RE | Admit: 2011-03-06 | Discharge: 2011-03-06 | Disposition: A | Discharge status: Home / Self Care | Payer: Medicare HMO | Source: Ambulatory Visit

## 2011-03-06 DIAGNOSIS — N186 End stage renal disease: Secondary | ICD-10-CM

## 2011-03-06 DIAGNOSIS — F17200 Nicotine dependence, unspecified, uncomplicated: Secondary | ICD-10-CM | POA: Insufficient documentation

## 2011-03-06 DIAGNOSIS — E119 Type 2 diabetes mellitus without complications: Secondary | ICD-10-CM | POA: Insufficient documentation

## 2011-03-06 DIAGNOSIS — Z992 Dependence on renal dialysis: Secondary | ICD-10-CM | POA: Insufficient documentation

## 2011-03-06 DIAGNOSIS — I120 Hypertensive chronic kidney disease with stage 5 chronic kidney disease or end stage renal disease: Secondary | ICD-10-CM | POA: Insufficient documentation

## 2011-03-06 DIAGNOSIS — E785 Hyperlipidemia, unspecified: Secondary | ICD-10-CM | POA: Insufficient documentation

## 2011-03-06 DIAGNOSIS — M109 Gout, unspecified: Secondary | ICD-10-CM | POA: Insufficient documentation

## 2011-03-06 DIAGNOSIS — N2581 Secondary hyperparathyroidism of renal origin: Secondary | ICD-10-CM | POA: Insufficient documentation

## 2011-03-06 LAB — RENAL FUNCTION PANEL
BUN: 46 mg/dL — ABNORMAL HIGH (ref 6–23)
Calcium: 9.8 mg/dL (ref 8.4–10.5)
Creatinine, Ser: 7.64 mg/dL — ABNORMAL HIGH (ref 0.50–1.10)
Glucose, Bld: 71 mg/dL (ref 70–99)
Phosphorus: 3.5 mg/dL (ref 2.3–4.6)

## 2011-03-06 LAB — CBC
HCT: 34.6 % — ABNORMAL LOW (ref 36.0–46.0)
Hemoglobin: 10.9 g/dL — ABNORMAL LOW (ref 12.0–15.0)
MCH: 30.4 pg (ref 26.0–34.0)
MCHC: 31.5 g/dL (ref 30.0–36.0)
MCV: 96.6 fL (ref 78.0–100.0)

## 2011-03-06 MED ORDER — INSULIN ASPART 100 UNIT/ML ~~LOC~~ SOLN: 0.0000 [IU] | Freq: Three times a day (TID) | SUBCUTANEOUS | Status: DC

## 2011-03-06 MED ORDER — DARBEPOETIN ALFA-POLYSORBATE 200 MCG/0.4ML IJ SOLN
200.0000 ug | Freq: Once | INTRAMUSCULAR | Status: AC
  Administered 2011-03-06: 200 ug via INTRAVENOUS

## 2011-03-06 MED ORDER — PARICALCITOL 5 MCG/ML IV SOLN
INTRAVENOUS | Status: AC
  Administered 2011-03-06: 1 ug via INTRAVENOUS
  Filled 2011-03-06: qty 1

## 2011-03-06 MED ORDER — PARICALCITOL 5 MCG/ML IV SOLN
1.0000 ug | Freq: Once | INTRAVENOUS | Status: AC
  Administered 2011-03-06: 1 ug via INTRAVENOUS

## 2011-03-06 MED ORDER — HEPARIN SODIUM (PORCINE) 1000 UNIT/ML DIALYSIS: 100.0000 [IU]/kg | INTRAMUSCULAR | Status: DC | PRN

## 2011-03-06 MED ORDER — DARBEPOETIN ALFA-POLYSORBATE 200 MCG/0.4ML IJ SOLN
INTRAMUSCULAR | Status: AC
  Administered 2011-03-06: 200 ug via INTRAVENOUS
  Filled 2011-03-06: qty 0.4

## 2011-03-06 NOTE — H&P (Signed)
Alexandra [name] MRN: [id number] DOB/AGE: 08/10/1945 65 y.o. 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 Patient unfortunately is in the process of finding out patient dialysis center to get established and a such presents to [facility] for her dialysis. Patient was recently admitted 2 days prior to admission on 03/04/2011 for her dialysis. Patient denies any chest pain, no shortness of breath, no fever, no nausea, no diarrhea, no constipation, no cough, no weakness, no abdominal pain, no other associated symptoms.  Dr. Bascom Levels states he will consult on the patient and a such triad hospitalists are asked to admit the patient.    Past Medical History  Diagnosis Date  . Chronic kidney disease     esrd  . Diabetes mellitus   . Hyperlipidemia   . Hypertension   . Renal disorder   . Dialysis care     tues, thurs, sat  . 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 Carotid endarterectomy 2002    left  . Btl   . Rectal fissurectomy   . Av fistula placement 10/30/2010    right brachiocephalic   . Fistulogram     Prior to Admission medications   Medication Sig Start Date End Date Taking? Authorizing Provider  allopurinol (ZYLOPRIM) 100 MG tablet Take 100 mg by mouth daily.     Yes Historical Provider, MD  amLODipine (NORVASC) 5 MG tablet Take 5 mg by mouth daily.    Yes Historical Provider, MD  calcium acetate (PHOSLO) 667 MG capsule Take 1,334 mg by mouth 3 (three) times daily with meals.    Yes Historical Provider, MD  Coenzyme Q10 150 MG CAPS Take 300 mg by mouth daily.    Yes Historical Provider, MD  colchicine 0.6 MG  tablet Take 0.6 mg by mouth daily as needed. For gout.   Yes Historical Provider, MD  docusate sodium (COLACE) 100 MG capsule Take 200 mg by mouth at bedtime.     Yes Historical Provider, MD  hydrALAZINE (APRESOLINE) 25 MG tablet Take 75 mg by mouth 3 (three) times daily.    Yes Historical Provider, MD  l-methylfolate-B6-B12 (METANX) 3-35-2 MG TABS Take 1 tablet by mouth 2 (two) times daily.    Yes Historical Provider, MD  losartan (COZAAR) 50 MG tablet Take 50 mg by mouth daily.     Yes Historical Provider, MD  metoprolol (TOPROL-XL) 50 MG 24 hr tablet Take 50 mg by mouth daily.     Yes Historical Provider, MD  multivitamin (RENA-VIT) TABS tablet Take 1 tablet by mouth daily.     Yes Historical Provider, MD  repaglinide (PRANDIN) 0.5 MG tablet Take 0.5 mg by mouth 3 (three) times daily before meals.     Yes Historical Provider, MD    Allergies:  Allergies  Allergen Reactions  . Codeine Other (See Comments)    Passes out  . Epinephrine Other (See Comments)    Tachycardia, diaphoresis, syncope  . Percocet (Oxycodone-Acetaminophen) Other (See Comments)    Passes  out    Family History  Problem Relation Age of Onset  . COPD Mother   . Kidney disease Mother   . Heart disease Father   . Lymphoma  Son     Social History:  reports that she has been smoking Cigarettes.  She has a 50 pack-year smoking history. She has never used smokeless tobacco. She reports that she does not drink alcohol or use illicit drugs.  ROS: All systems reviewed with the patient and was positive as per HPI otherwise all other systems are negative.  PHYSICAL EXAM: Blood pressure 201/70, pulse 87, temperature 98.3 F (36.8 C), temperature source Oral, resp. rate 16, SpO2 99.00%. General: Well-developed, well-nourished in no acute cardiopulmonary distress. HEENT: Normocephalic atraumatic. Pupils equal round and reactive to light and accommodation. Extraocular movements intact. Oropharynx is clear, no lesions, no  exudates. Neck is supple no lymphadenopathy. No bruits, no goiter. Heart: Regular rate and rhythm, without murmurs, rubs, gallops. Lungs: Minimal bibasilar crackles otherwise clear.. Abdomen: Soft, nontender, nondistended, positive bowel sounds. Extremities: No clubbing cyanosis or edema with positive pedal pulses. Neuro: Grossly intact, nonfocal.   EKG: None  No results found for this or any previous visit (from the past 240 hour(s)).   Lab results:  [facility] 03/04/11 2055  NA 140  K 3.8  CL 101  CO2 30  GLUCOSE 171*  BUN 37*  CREATININE 6.19*  CALCIUM 9.8  MG --  PHOS 1.7*    Basename 03/04/11 2055  AST --  ALT --  ALKPHOS --  BILITOT --  PROT --  ALBUMIN 3.1*   No results found for this basename: LIPASE:2,AMYLASE:2 in the last 72 hours  Basename 03/04/11 2055  WBC 6.3  NEUTROABS 3.5  HGB 10.4*  HCT 34.4*  MCV 98.3  PLT 216   No results found for this basename: CKTOTAL:3,CKMB:3,CKMBINDEX:3,TROPONINI:3 in the last 72 hours No components found with this basename: POCBNP:3 No results found for this basename: DDIMER in the last 72 hours No results found for this basename: HGBA1C:2 in the last 72 hours No results found for this basename: CHOL:2,HDL:2,LDLCALC:2,TRIG:2,CHOLHDL:2,LDLDIRECT:2 in the last 72 hours No results found for this basename: TSH,T4TOTAL,FREET3,T3FREE,THYROIDAB in the last 72 hours No results found for this basename: VITAMINB12:2,FOLATE:2,FERRITIN:2,TIBC:2,IRON:2,RETICCTPCT:2 in the last 72 hours Imaging results:  No results found. Impression/Plan:  Principal Problem:  *End stage renal disease Active Problems:  HTN (hypertension)  DM (diabetes mellitus)  Hyperparathyroidism, secondary renal   #1 end-stage renal disease We'll admit the patient for hemodialysis. Dr. Bascom Levels was consulted on the patient and dialysis orders have been written. Labs will be obtained per hemodialysis per Dr. Bascom Levels. #2 hypertension Continue hydralazine and  metoprolol. Will increase patient's Norvasc to 10 mg daily. Patient will be receiving hemodialysis and we'll reassess blood pressure. #3 diabetes mellitus Sliding scale insulin. #4 secondary hyperparathyroidism Per renal. #5 prophylaxis Heparin for DVT prophylaxis. Is been a pleasure taking care of [name] Offutt.   THOMPSON,DANIEL 03/06/2011, 6:58 PM

## 2011-03-06 NOTE — ED Notes (Signed)
Pt. Stated, I'm here for dialysis I come here.

## 2011-03-06 NOTE — Progress Notes (Signed)
Patient ID: Alexandra [name], female   DOB: 09-20-1945, 65 y.o.   MRN: [id number]

## 2011-03-06 NOTE — ED Notes (Signed)
Spoke with Dr. Bascom Levels concerning lab draw. He states pt can have blood drawn while in dialysis.

## 2011-03-06 NOTE — ED Provider Notes (Signed)
History     CSN: [id number]  Arrival date & time 03/06/11  1425   First MD Initiated Contact with Patient 03/06/11 1636      Chief Complaint  Patient presents with  . Vascular Access Problem    (Consider location/radiation/quality/duration/timing/severity/associated sxs/prior treatment) HPI Comments: History of esrd on hd.  Here for dialysis.  Comes here due to "social issues".  No complaints.  Denies pain.  Believes her blood sugar may be low.    Patient is a 65 y.o. female presenting with general illness.  Illness     Past Medical History  Diagnosis Date  . Chronic kidney disease     esrd  . Diabetes mellitus   . Hyperlipidemia   . Hypertension   . Renal disorder   . Dialysis care     tues, thurs, sat  . 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 Carotid endarterectomy 2002    left  . Btl   . Rectal fissurectomy   . Av fistula placement 10/30/2010    right brachiocephalic   . Fistulogram     Family History  Problem Relation Age of Onset  . COPD Mother   . Kidney disease Mother   . Heart disease Father   . Lymphoma Son     History  Substance Use Topics  . Smoking status: Current Everyday Smoker -- 1.0 packs/day for 50 years    Types: Cigarettes  . Smokeless tobacco: Never Used  . Alcohol Use: No    OB History    Grav Para Term Preterm Abortions TAB SAB Ect Mult Living                  Review of Systems  All other systems reviewed and are negative.    Allergies  Codeine; Epinephrine; and Percocet  Home Medications   Current Outpatient Rx  Name Route Sig Dispense Refill  . ALLOPURINOL 100 MG PO TABS Oral Take 100 mg by mouth daily.      Marland Kitchen AMLODIPINE BESYLATE 5 MG PO TABS Oral Take 5 mg by mouth daily.     Marland Kitchen CALCIUM ACETATE 667 MG PO CAPS Oral Take 1,334 mg by mouth 3 (three) times daily with meals.     Marland Kitchen COENZYME Q10 150 MG PO CAPS Oral Take 300 mg by mouth daily.     . COLCHICINE 0.6 MG PO TABS Oral  Take 0.6 mg by mouth daily as needed. For gout.    Marland Kitchen DOCUSATE SODIUM 100 MG PO CAPS Oral Take 200 mg by mouth at bedtime.      Marland Kitchen HYDRALAZINE HCL 25 MG PO TABS Oral Take 75 mg by mouth 3 (three) times daily.     . L-METHYLFOLATE-B6-B12 3-35-2 MG PO TABS Oral Take 1 tablet by mouth 2 (two) times daily.     Marland Kitchen LOSARTAN POTASSIUM 50 MG PO TABS Oral Take 50 mg by mouth daily.      Marland Kitchen METOPROLOL SUCCINATE ER 50 MG PO TB24 Oral Take 50 mg by mouth daily.      Marland Kitchen RENA-VITE PO TABS Oral Take 1 tablet by mouth daily.      Marland Kitchen REPAGLINIDE 0.5 MG PO TABS Oral Take 0.5 mg by mouth 3 (three) times daily before meals.        BP 201/70  Pulse 87  Temp(Src) 98.3 F (36.8 C) (Oral)  Resp 16  SpO2 99%  Physical Exam  Constitutional: She is oriented  to person, place, and time. She appears well-developed and well-nourished.  HENT:  Head: Normocephalic and atraumatic.  Eyes: EOM are normal. Pupils are equal, round, and reactive to light.  Neck: Normal range of motion. Neck supple.  Cardiovascular: Normal rate and regular rhythm.   Pulmonary/Chest: Effort normal and breath sounds normal.  Abdominal: Soft. Bowel sounds are normal.  Musculoskeletal: Normal range of motion. She exhibits no edema.  Neurological: She is alert and oriented to person, place, and time.  Skin: Skin is warm and dry. She is not diaphoretic.    ED Course  Procedures (including critical care time)   Labs Reviewed  CBC  DIFFERENTIAL  BASIC METABOLIC PANEL   No results found.   No diagnosis found.    MDM  Dr. Bascom Levels came to see patient and make arrangements for dialysis.          Geoffery Lyons, MD 03/06/11 [phone]

## 2011-03-06 NOTE — ED Notes (Signed)
Call [facility] for admitting to dialysis

## 2011-03-06 NOTE — ED Notes (Signed)
Hot water for paitent's tea and meal tray provided per Dr. Bascom Levels order.

## 2011-03-06 NOTE — ED Notes (Signed)
Waiting  In general waiting

## 2011-03-08 ENCOUNTER — Emergency Department (HOSPITAL_COMMUNITY)
Admission: EM | Admit: 2011-03-08 | Discharge: 2011-03-09 | Disposition: A | Discharge status: Home / Self Care | Payer: Medicare HMO | Attending: Internal Medicine | Admitting: Internal Medicine

## 2011-03-08 ENCOUNTER — Emergency Department (HOSPITAL_COMMUNITY)
Admission: RE | Admit: 2011-03-08 | Discharge: 2011-03-08 | Disposition: A | Discharge status: Home / Self Care | Payer: Medicare HMO | Source: Ambulatory Visit | Attending: Nephrology | Admitting: Nephrology

## 2011-03-08 ENCOUNTER — Encounter (HOSPITAL_COMMUNITY): Payer: Self-pay | Admitting: *Deleted

## 2011-03-08 ENCOUNTER — Emergency Department (HOSPITAL_COMMUNITY): Payer: Medicare HMO

## 2011-03-08 DIAGNOSIS — E119 Type 2 diabetes mellitus without complications: Secondary | ICD-10-CM | POA: Insufficient documentation

## 2011-03-08 DIAGNOSIS — N186 End stage renal disease: Secondary | ICD-10-CM

## 2011-03-08 DIAGNOSIS — I120 Hypertensive chronic kidney disease with stage 5 chronic kidney disease or end stage renal disease: Secondary | ICD-10-CM | POA: Insufficient documentation

## 2011-03-08 DIAGNOSIS — Z992 Dependence on renal dialysis: Secondary | ICD-10-CM | POA: Insufficient documentation

## 2011-03-08 LAB — CBC
MCH: 30.1 pg (ref 26.0–34.0)
MCHC: 31.6 g/dL (ref 30.0–36.0)
MCV: 95.1 fL (ref 78.0–100.0)
Platelets: 185 10*3/uL (ref 150–400)
RBC: 3.69 MIL/uL — ABNORMAL LOW (ref 3.87–5.11)

## 2011-03-08 LAB — RENAL FUNCTION PANEL
CO2: 25 mEq/L (ref 19–32)
Calcium: 9.8 mg/dL (ref 8.4–10.5)
Creatinine, Ser: 6.56 mg/dL — ABNORMAL HIGH (ref 0.50–1.10)
GFR calc non Af Amer: 6 mL/min — ABNORMAL LOW (ref >90)
Glucose, Bld: 167 mg/dL — ABNORMAL HIGH (ref 70–99)
Phosphorus: 3.2 mg/dL (ref 2.3–4.6)
Sodium: 135 mEq/L (ref 135–145)

## 2011-03-08 MED ORDER — ENOXAPARIN SODIUM 30 MG/0.3ML ~~LOC~~ SOLN
30.0000 mg | SUBCUTANEOUS | Status: DC
  Filled 2011-03-08: qty 0.3

## 2011-03-08 MED ORDER — HEPARIN SODIUM (PORCINE) 1000 UNIT/ML DIALYSIS
20.0000 [IU]/kg | INTRAMUSCULAR | Status: DC | PRN
  Filled 2011-03-08: qty 2

## 2011-03-08 NOTE — ED Notes (Signed)
Received pt from room 1, pt awaiting dialysis. Meal tray provided for pt. Pt sitting in recliner watching tv. No needs at this time.

## 2011-03-08 NOTE — ED Notes (Signed)
Pt is here for her routine hemodialysis treatment.  Last HD was on Saturday.  Pt sees Dr. Bascom Levels.

## 2011-03-08 NOTE — ED Notes (Signed)
Pt. bgloodsugar    163mg 

## 2011-03-08 NOTE — ED Notes (Signed)
Pt transported to hemodialysis  

## 2011-03-08 NOTE — Progress Notes (Signed)
Patient ID: Alexandra [name], female   DOB: 03-May-1945, 65 y.o.   MRN: [id number]        65 yo female    the  Pt. Has some chf. She needs dialysis. See orders.t.

## 2011-03-08 NOTE — ED Notes (Signed)
Report given to Dois Davenport, RN in hemodialysis. Send her up in app 15 mins.

## 2011-03-08 NOTE — H&P (Signed)
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 Patient unfortunately is in the process of finding an out patient dialysis center to get established and a such presents to [name] for her dialysis. Patient was recently admitted 2 days prior to admission on 03/06/2011 for her dialysis. Patient denies any chest pain, no shortness of breath, no fever, no nausea, no diarrhea, no constipation, no cough, no weakness, no abdominal pain, no other associated symptoms. Dr. Bascom Levels states he will consult on the patient and a such [facility] hospitalists are asked to admit the patient.  Past Medical History   Diagnosis  Date   .  Chronic kidney disease      esrd   .  Diabetes mellitus    .  Hyperlipidemia    .  Hypertension    .  Renal disorder    .  Dialysis care      tues, thurs, sat   .  Gout due to renal impairment     Past Surgical History   Procedure  Date   .  Carotid endarterectomy  2002     left   .  Btl    .  Rectal fissurectomy    .  Av fistula placement  10/30/2010     right brachiocephalic   .  Fistulogram     Prior to Admission medications   Medication  Sig  Start Date  End Date  Taking?  Authorizing Provider   allopurinol (ZYLOPRIM) 100 MG tablet  Take 100 mg by mouth daily.    Yes  Historical Provider, MD   amLODipine (NORVASC) 5 MG tablet  Take 5 mg by mouth daily.    Yes  Historical Provider, MD   calcium acetate (PHOSLO) 667 MG capsule  Take 1,334 mg by mouth 3 (three) times daily with meals.    Yes  Historical Provider, MD   Coenzyme Q10 150 MG CAPS  Take 300 mg by mouth daily.    Yes  Historical Provider, MD   colchicine 0.6 MG tablet  Take 0.6 mg by mouth daily as needed. For gout.    Yes  Historical Provider, MD     docusate sodium (COLACE) 100 MG capsule  Take 200 mg by mouth at bedtime.    Yes  Historical Provider, MD   hydrALAZINE (APRESOLINE) 25 MG tablet  Take 75 mg by mouth 3 (three) times daily.    Yes  Historical Provider, MD   l-methylfolate-B6-B12 (METANX) 3-35-2 MG TABS  Take 1 tablet by mouth 2 (two) times daily.    Yes  Historical Provider, MD   losartan (COZAAR) 50 MG tablet  Take 50 mg by mouth daily.    Yes  Historical Provider, MD   metoprolol (TOPROL-XL) 50 MG 24 hr tablet  Take 50 mg by mouth daily.    Yes  Historical Provider, MD   multivitamin (RENA-VIT) TABS tablet  Take 1 tablet by mouth daily.    Yes  Historical Provider, MD   repaglinide (PRANDIN) 0.5 MG tablet  Take 0.5 mg by mouth 3 (three) times daily before meals.    Yes  Historical Provider, MD   Allergies:  Allergies   Allergen  Reactions   .  Codeine  Other (See Comments)     Passes  out   .  Epinephrine  Other (See Comments)     Tachycardia, diaphoresis, syncope   .  Percocet (Oxycodone-Acetaminophen)  Other (See Comments)     Passes out    Family History   Problem  Relation  Age of Onset   .  COPD  Mother    .  Kidney disease  Mother    .  Heart disease  Father    .  Lymphoma  Son     Social History: reports that she has been smoking Cigarettes. She has a 50 pack-year smoking history. She has never used smokeless tobacco. She reports that she does not drink alcohol or use illicit drugs.  ROS: All systems reviewed with the patient and was positive as per HPI otherwise all other systems are negative.  PHYSICAL EXAM:  Blood pressure 201/70, pulse 87, temperature 98.3 F (36.8 C), temperature source Oral, resp. rate 16, SpO2 99.00%.  General: Well-developed, well-nourished in no acute cardiopulmonary distress.  HEENT: Normocephalic atraumatic. Pupils equal round and reactive to light and accommodation. Extraocular movements intact. Oropharynx is clear, no lesions, no exudates. Neck is supple no lymphadenopathy. No  bruits, no goiter.  Heart: Regular rate and rhythm, without murmurs, rubs, gallops.  Lungs: Minimal bibasilar crackles otherwise clear..  Abdomen: Soft, nontender, nondistended, positive bowel sounds.  Extremities: No clubbing cyanosis or edema with positive pedal pulses.  Neuro: Grossly intact, nonfocal.  EKG: None  No results found for this or any previous visit (from the past 240 hour(s)).  Lab results:  None   Impression/Plan:  Principal Problem:  *End stage renal disease  Active Problems:  HTN (hypertension)  DM (diabetes mellitus)  Hyperparathyroidism, secondary renal   #1 end-stage renal disease  We'll admit the patient for hemodialysis. Dr. Bascom Levels was consulted on the patient and dialysis orders have been written. Labs will be obtained per hemodialysis per Dr. Bascom Levels.  #2 hypertension  Continue hydralazine and metoprolol and Norvasc, #3 diabetes mellitus  Sliding scale insulin.  #4 secondary hyperparathyroidism  Per renal.  Disposition per renal

## 2011-03-09 NOTE — ED Provider Notes (Signed)
History     CSN: [id number]  Arrival date & time 03/08/11  4098   First MD Initiated Contact with Patient 03/08/11 [phone]      Chief Complaint  Patient presents with  . Needs dialysis     (Consider location/radiation/quality/duration/timing/severity/associated sxs/prior treatment) The history is provided by the patient and medical records.   the patient is a 65 year old, pleasant female, who has end-stage renal disease.  She gets dialysis on Tuesday, Thursday, Saturday.  She had dialysis last Saturday.  His Tuesdays, Christmas, so she arranged with her nephrologist to get dialyzed on Monday Christmas Eve.  She has no complaints.  She states that she does not need blood work because of his in the dialysis unit.  She states that we are supposed to call her nephrologist when she arrived to the emergency department.  Past Medical History  Diagnosis Date  . Chronic kidney disease     esrd  . Diabetes mellitus   . Hyperlipidemia   . Hypertension   . Renal disorder   . Dialysis care     tues, thurs, sat  . 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 Carotid endarterectomy 2002    left  . Btl   . Rectal fissurectomy   . Av fistula placement 10/30/2010    right brachiocephalic   . Fistulogram     Family History  Problem Relation Age of Onset  . COPD Mother   . Kidney disease Mother   . Heart disease Father   . Lymphoma Son     History  Substance Use Topics  . Smoking status: Current Everyday Smoker -- 1.0 packs/day for 50 years    Types: Cigarettes  . Smokeless tobacco: Never Used  . Alcohol Use: No    OB History    Grav Para Term Preterm Abortions TAB SAB Ect Mult Living                  Review of Systems  Constitutional: Negative for fever.  Respiratory: Negative for cough and shortness of breath.   Cardiovascular: Negative for chest pain.  Gastrointestinal: Negative for abdominal pain.  Neurological: Negative for  light-headedness and headaches.  Psychiatric/Behavioral: Negative for confusion.    Allergies  Codeine; Epinephrine; and Percocet  Home Medications   Current Outpatient Rx  Name Route Sig Dispense Refill  . ALLOPURINOL 100 MG PO TABS Oral Take 100 mg by mouth daily.      Marland Kitchen AMLODIPINE BESYLATE 5 MG PO TABS Oral Take 5 mg by mouth daily.     Marland Kitchen CALCIUM ACETATE 667 MG PO CAPS Oral Take 1,334 mg by mouth 3 (three) times daily with meals.     Marland Kitchen COENZYME Q10 150 MG PO CAPS Oral Take 300 mg by mouth daily.     Marland Kitchen HYDRALAZINE HCL 25 MG PO TABS Oral Take 75 mg by mouth 3 (three) times daily.     . L-METHYLFOLATE-B6-B12 3-35-2 MG PO TABS Oral Take 1 tablet by mouth 2 (two) times daily.     Marland Kitchen LOSARTAN POTASSIUM 50 MG PO TABS Oral Take 50 mg by mouth daily.      Marland Kitchen METOPROLOL SUCCINATE ER 50 MG PO TB24 Oral Take 50 mg by mouth daily.      Marland Kitchen RENA-VITE PO TABS Oral Take 1 tablet by mouth daily.      Marland Kitchen REPAGLINIDE 0.5 MG PO TABS Oral Take 0.5 mg by mouth 3 (three) times  daily before meals.      . COLCHICINE 0.6 MG PO TABS Oral Take 0.6 mg by mouth daily as needed. For gout.    Marland Kitchen DOCUSATE SODIUM 100 MG PO CAPS Oral Take 200 mg by mouth at bedtime.        BP 156/68  Pulse 82  Temp(Src) 98.2 F (36.8 C) (Oral)  Resp 18  Wt 124 lb 9 oz (56.5 kg)  SpO2 97%  Physical Exam  Constitutional: She is oriented to person, place, and time. She appears well-developed and well-nourished.  HENT:  Head: Normocephalic and atraumatic.  Eyes: Pupils are equal, round, and reactive to light.  Neck: Normal range of motion.  Cardiovascular: Normal rate, regular rhythm and normal heart sounds.   No murmur heard. Pulmonary/Chest: Effort normal and breath sounds normal. No respiratory distress. She has no wheezes. She has no rales.  Abdominal: She exhibits no distension.  Musculoskeletal: Normal range of motion. She exhibits no edema and no tenderness.  Neurological: She is alert and oriented to person, place, and  time. No cranial nerve deficit.  Skin: Skin is warm and dry. No rash noted. No erythema.  Psychiatric: She has a normal mood and affect. Her behavior is normal.    ED Course  Procedures (including critical care time)  65 year old, arrived for routine dialysis.  No testing in the emergency department indicated.  Her nephrologist, was called and he came in to write orders for dialysis. female, with end-stage renal disease, who is asymptomatic  Labs Reviewed  GLUCOSE, CAPILLARY - Abnormal; Notable for the following:    Glucose-Capillary 163 (*)    All other components within normal limits  CBC - Abnormal; Notable for the following:    RBC 3.69 (*)    Hemoglobin 11.1 (*)    HCT 35.1 (*)    RDW 20.9 (*)    All other components within normal limits  RENAL FUNCTION PANEL - Abnormal; Notable for the following:    Chloride 95 (*)    Glucose, Bld 167 (*)    BUN 35 (*)    Creatinine, Ser 6.56 (*)    Albumin 3.3 (*)    GFR calc non Af Amer 6 (*)    GFR calc Af Amer 7 (*)    All other components within normal limits  LAB REPORT - SCANNED   No results found.   No diagnosis found.    MDM  End stage renal disease        Nicholes Stairs, MD 03/09/11 [phone]

## 2011-03-11 ENCOUNTER — Emergency Department (HOSPITAL_COMMUNITY)
Admission: EM | Admit: 2011-03-11 | Discharge: 2011-03-11 | Disposition: A | Discharge status: Home / Self Care | Payer: Medicare HMO | Attending: Emergency Medicine | Admitting: Emergency Medicine

## 2011-03-11 ENCOUNTER — Emergency Department (HOSPITAL_COMMUNITY): Payer: Medicare HMO

## 2011-03-11 ENCOUNTER — Encounter (HOSPITAL_COMMUNITY): Payer: Self-pay | Admitting: *Deleted

## 2011-03-11 DIAGNOSIS — E785 Hyperlipidemia, unspecified: Secondary | ICD-10-CM | POA: Diagnosis present

## 2011-03-11 DIAGNOSIS — E119 Type 2 diabetes mellitus without complications: Secondary | ICD-10-CM | POA: Insufficient documentation

## 2011-03-11 DIAGNOSIS — N186 End stage renal disease: Secondary | ICD-10-CM | POA: Insufficient documentation

## 2011-03-11 DIAGNOSIS — N2581 Secondary hyperparathyroidism of renal origin: Secondary | ICD-10-CM | POA: Diagnosis present

## 2011-03-11 DIAGNOSIS — D638 Anemia in other chronic diseases classified elsewhere: Secondary | ICD-10-CM | POA: Diagnosis present

## 2011-03-11 DIAGNOSIS — I120 Hypertensive chronic kidney disease with stage 5 chronic kidney disease or end stage renal disease: Secondary | ICD-10-CM | POA: Insufficient documentation

## 2011-03-11 DIAGNOSIS — Z992 Dependence on renal dialysis: Secondary | ICD-10-CM | POA: Insufficient documentation

## 2011-03-11 LAB — POCT I-STAT 4, (NA,K, GLUC, HGB,HCT)
Glucose, Bld: 151 mg/dL — ABNORMAL HIGH (ref 70–99)
HCT: 39 % (ref 36.0–46.0)
Potassium: 4.4 mEq/L (ref 3.5–5.1)

## 2011-03-11 LAB — GLUCOSE, CAPILLARY

## 2011-03-11 MED ORDER — HEPARIN SODIUM (PORCINE) 1000 UNIT/ML DIALYSIS: 20.0000 [IU]/kg | INTRAMUSCULAR | Status: DC | PRN

## 2011-03-11 NOTE — ED Notes (Signed)
[name] at bedside for dialysis orders

## 2011-03-11 NOTE — ED Notes (Signed)
CBG 142 at 11:38

## 2011-03-11 NOTE — ED Notes (Signed)
Patient is resting comfortably. Assumed care of pt, pt awaiting dialysis

## 2011-03-11 NOTE — H&P (Signed)
Alexandra [name] CSN:[id number],MRN:[id number]  Outpatient Primary MD for the patient is [name], MD, MD  With History of -  Past Medical History  Diagnosis Date  . Chronic kidney disease     esrd  . Diabetes mellitus   . Hyperlipidemia   . Hypertension   . Renal disorder   . Dialysis care     tues, thurs, sat  . Gout due to renal impairment       Past Surgical History  Procedure Date  . Carotid endarterectomy 2002    left  . Btl   . Rectal fissurectomy   . Av fistula placement 10/30/2010    right brachiocephalic   . Fistulogram     in for   Chief Complaint  Patient presents with  . Vascular Access Problem     HPI  Alexandra [name]  is a 65 y.o. female,  with history of ESRD stage V patient on routine dialysis on Tuesday Thursday Saturday, who follows with Dr. Leretha Dykes for her dialysis, and states that she is not involved with any outpatient dialysis clinics at this time. She says that according to Dr. Leretha Dykes she has been instructed to come to the hospital on Tuesday Thursday Saturday, date admitted to the hospital for dialysis and then be discharged thereafter. Per patient she apparently is on a [facility]trans[facility] at [facility].  Patient in the ER has no subjective complaints whatsoever, she has refused blood draws in the ER as per ER M.D., the ER M.D. and I myself was discussed the case with Dr. Leretha Dykes who is aware of the situation and states he will take care of the dialysis and labs later today.  Review of Systems    In addition to the HPI above,  No Fever-chills, No Headache, No changes with Vision or hearing, No problems swallowing food or Liquids, No Chest pain, Cough or Shortness of Breath, No Abdominal pain, No Nausea or Vommitting, Bowel movements are regular, No Blood in stool or Urine, No dysuria, No new skin rashes or bruises, No new joints pains-aches,  No new weakness, tingling, numbness in any extremity, No recent weight  gain or loss, No polyuria, polydypsia or polyphagia, No significant Mental Stressors.  A full 10 point Review of Systems was done, except as stated above, all other Review of Systems were negative.   Social History History  Substance Use Topics  . Smoking status: Current Everyday Smoker -- 1.0 packs/day for 50 years    Types: Cigarettes  . Smokeless tobacco: Never Used  . Alcohol Use: No       Family History Family History  Problem Relation Age of Onset  . COPD Mother   . Kidney disease Mother   . Heart disease Father   . Lymphoma Son       Prior to Admission medications   Medication Sig Start Date End Date Taking? Authorizing Provider  allopurinol (ZYLOPRIM) 100 MG tablet Take 100 mg by mouth daily.     Yes Historical Provider, MD  amLODipine (NORVASC) 5 MG tablet Take 5 mg by mouth daily.    Yes Historical Provider, MD  calcium acetate (PHOSLO) 667 MG capsule Take 1,334 mg by mouth 3 (three) times daily with meals.    Yes Historical Provider, MD  Coenzyme Q10 150 MG CAPS Take 300 mg by mouth daily.    Yes Historical Provider, MD  colchicine 0.6 MG tablet Take 0.6 mg by mouth daily as needed. For gout.   Yes Historical  Provider, MD  docusate sodium (COLACE) 100 MG capsule Take 200 mg by mouth at bedtime.     Yes Historical Provider, MD  l-methylfolate-B6-B12 (METANX) 3-35-2 MG TABS Take 1 tablet by mouth 2 (two) times daily.    Yes Historical Provider, MD  losartan (COZAAR) 50 MG tablet Take 50 mg by mouth daily.     Yes Historical Provider, MD  metoprolol (TOPROL-XL) 50 MG 24 hr tablet Take 50 mg by mouth daily.     Yes Historical Provider, MD  multivitamin (RENA-VIT) TABS tablet Take 1 tablet by mouth daily.     Yes Historical Provider, MD  repaglinide (PRANDIN) 0.5 MG tablet Take 0.5 mg by mouth 3 (three) times daily before meals.     Yes Historical Provider, MD  hydrALAZINE (APRESOLINE) 25 MG tablet Take 75 mg by mouth 3 (three) times daily.     Historical Provider, MD     Allergies  Allergen Reactions  . Codeine Other (See Comments)    Passes out  . Epinephrine Other (See Comments)    Tachycardia, diaphoresis, syncope  . Percocet (Oxycodone-Acetaminophen) Other (See Comments)    Passes  out    Physical Exam No intake or output data in the 24 hours ending 03/11/11 1321 Blood pressure 179/50, pulse 85, temperature 97.9 F (36.6 C), temperature source Oral, resp. rate 18, SpO2 96.00%.   1. General  middle-aged African American female sitting in the ER chair watching television in bed in NAD,   2. Normal affect and insight, Not Suicidal or Homicidal, Awake Alert, Oriented *3.  3. No F.N deficits, ALL C.Nerves Intact, Strength 5/5 all 4 extremities, Sensation intact all 4 extremities, Plantars down going.  4. Ears and Eyes appear Normal, Conjunctivae clear, PERRLA. Moist Oral Mucosa.  5. Supple Neck, No JVD, No cervical lymphadenopathy appriciated, No Carotid Bruits.  6. Symmetrical Chest wall movement, Good air movement bilaterally, CTAB. Rt chest wall Dialysis catheter stable.  7. RRR, No Gallops, Rubs or Murmurs, No Parasternal Heave.  8. Positive Bowel Sounds, Abdomen Soft, Non tender, No organomegaly appriciated,       No rebound -guarding or rigidity.  9.  No Cyanosis, Normal Skin Turgor, No Skin Rash or Bruise.  10. Good muscle tone,  joints appear normal , no effusions, Normal ROM.  11. No Palpable Lymph Nodes in Neck or Axillae     Data Review  CBC  Lab 03/08/11 1225 03/06/11 2027 03/04/11 2055  WBC 7.4 6.9 6.3  HGB 11.1* 10.9* 10.4*  HCT 35.1* 34.6* 34.4*  PLT 185 200 216  MCV 95.1 96.6 98.3  MCH 30.1 30.4 29.7  MCHC 31.6 31.5 30.2  RDW 20.9* 21.8* 22.5*  LYMPHSABS -- -- 1.9  MONOABS -- -- 0.9  EOSABS -- -- 0.1  BASOSABS -- -- 0.0  BANDABS -- -- --   ------------------------------------------------------------------------------------------------------------------ Chemistries   Lab 03/08/11 1225 03/06/11 2027  03/04/11 2055  NA 135 140 140  K 4.1 5.0 3.8  CL 95* 102 101  CO2 25 27 30   GLUCOSE 167* 71 171*  BUN 35* 46* 37*  CREATININE 6.56* 7.64* 6.19*  CALCIUM 9.8 9.8 9.8  MG -- -- --  AST -- -- --  ALT -- -- --  ALKPHOS -- -- --  BILITOT -- -- --   ------------------------------------------------------------------------------------------------------------------ CrCl is unknown because both a height and weight (above a minimum accepted value) are required for this calculation. ------------------------------------------------------------------------------------------------------------------ No results found for this basename: TSH,T4TOTAL,FREET3,T3FREE,THYROIDAB in the last 72 hours  Coagulation  profile No results found for this basename: INR:5,PROTIME:5 in the last 168 hours ------------------------------------------------------------------------------------------------------------------- No results found for this basename: DDIMER:2 in the last 72 hours ------------------------------------------------------------------------------------------------------------------- Cardiac Enzymes No results found for this basename: CK:3,CKMB:3,TROPONINI:3,MYOGLOBIN:3 in the last 168 hours ------------------------------------------------------------------------------------------------------------------ No components found with this basename: POCBNP:3 ------------------------------------------------------------------------------------------------------------------     Assessment & Plan  #1. End stage renal disease - I have discussed the case with Dr. Leretha Dykes who wants the patient to be admitted to the hospital, he will obtain labs and do the dialysis orders, thereafter patient most likely will sign out AMA or will be discharged, the plan per Dr. Leretha Dykes is to have patient come to the hospital he states is a Saturday be admitted for dialysis and then discharged thereafter.  #2.HTN - we'll continue home  medications  Hydralazine, metoprolol and Norvasc with holding parameters.  #3.DM (diabetes mellitus) - home medications with q. a.c. sliding scale   #4. Secondary hyperparathyroidism -Per renal.   #5. History of smoking patient counseled to quit smoking.  Disposition per renal  DVT Prophylaxis   SCDs    AM Labs Ordered, also please review Full Orders  Admission, patients condition and plan of care including tests being ordered have been discussed with the patient   who indicates understanding and agree with the plan and Code Status.  Code Status Full  Condition Marinell Blight K M.D on 03/11/2011 at 1:21 PM  [facility] Office  [phone]

## 2011-03-11 NOTE — ED Notes (Addendum)
Pt requesting hot tray. Requesting that admission take place quickly.

## 2011-03-11 NOTE — ED Notes (Addendum)
Patient states she is here for dialysis. Days are Tuesday, Thursday and Saturday. Patient with no complaints at this time. Patient has subclavian access to right chest wall. Pt with graft maturing to right upper arm. Old graft to left upper arm. Dr Bascom Levels has called and would like [facility] to admit. Once [facility] has agreed to admit please call Dr Bascom Levels and he will write dialysis orders.

## 2011-03-11 NOTE — ED Notes (Signed)
Pt here for dialysis, needs to be admitted per patient.  Dr Bascom Levels is nephrologist.

## 2011-03-11 NOTE — ED Notes (Signed)
Per lab, pt has refused the I-stat and they noted the account

## 2011-03-11 NOTE — ED Notes (Signed)
Nephrology at bedside

## 2011-03-11 NOTE — ED Provider Notes (Signed)
History     CSN: [id number]  Arrival date & time 03/11/11  1018   First MD Initiated Contact with Patient 03/11/11 1105      Chief Complaint  Patient presents with  . Vascular Access Problem    (Consider location/radiation/quality/duration/timing/severity/associated sxs/prior treatment) The history is provided by the patient.  pt w hx esrd on hd for past year, states is here for dialyses. States goes to hd t/th/sat, and that she comes to ED for dialyses. Pt states unsure why arrangements not made for her to dialyze at an outpt center. Last had hd Monday of this week. Denies sob. No nv. No weakness. States feels normal/fine.   Past Medical History  Diagnosis Date  . Chronic kidney disease     esrd  . Diabetes mellitus   . Hyperlipidemia   . Hypertension   . Renal disorder   . Dialysis care     tues, thurs, sat  . 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 Carotid endarterectomy 2002    left  . Btl   . Rectal fissurectomy   . Av fistula placement 10/30/2010    right brachiocephalic   . Fistulogram     Family History  Problem Relation Age of Onset  . COPD Mother   . Kidney disease Mother   . Heart disease Father   . Lymphoma Son     History  Substance Use Topics  . Smoking status: Current Everyday Smoker -- 1.0 packs/day for 50 years    Types: Cigarettes  . Smokeless tobacco: Never Used  . Alcohol Use: No    OB History    Grav Para Term Preterm Abortions TAB SAB Ect Mult Living                  Review of Systems  Constitutional: Negative for fever.  Respiratory: Negative for shortness of breath.   Cardiovascular: Negative for chest pain and leg swelling.  Gastrointestinal: Negative for nausea, vomiting and diarrhea.    Allergies  Codeine; Epinephrine; and Percocet  Home Medications   Current Outpatient Rx  Name Route Sig Dispense Refill  . ALLOPURINOL 100 MG PO TABS Oral Take 100 mg by mouth daily.      Marland Kitchen  AMLODIPINE BESYLATE 5 MG PO TABS Oral Take 5 mg by mouth daily.     Marland Kitchen CALCIUM ACETATE 667 MG PO CAPS Oral Take 1,334 mg by mouth 3 (three) times daily with meals.     Marland Kitchen COENZYME Q10 150 MG PO CAPS Oral Take 300 mg by mouth daily.     . COLCHICINE 0.6 MG PO TABS Oral Take 0.6 mg by mouth daily as needed. For gout.    Marland Kitchen DOCUSATE SODIUM 100 MG PO CAPS Oral Take 200 mg by mouth at bedtime.      . L-METHYLFOLATE-B6-B12 3-35-2 MG PO TABS Oral Take 1 tablet by mouth 2 (two) times daily.     Marland Kitchen LOSARTAN POTASSIUM 50 MG PO TABS Oral Take 50 mg by mouth daily.      Marland Kitchen METOPROLOL SUCCINATE ER 50 MG PO TB24 Oral Take 50 mg by mouth daily.      Marland Kitchen RENA-VITE PO TABS Oral Take 1 tablet by mouth daily.      Marland Kitchen REPAGLINIDE 0.5 MG PO TABS Oral Take 0.5 mg by mouth 3 (three) times daily before meals.      Marland Kitchen HYDRALAZINE HCL 25 MG PO TABS Oral Take 75  mg by mouth 3 (three) times daily.       BP 179/50  Pulse 85  Temp(Src) 97.9 F (36.6 C) (Oral)  Resp 18  SpO2 96%  Physical Exam  Nursing note and vitals reviewed. Constitutional: She is oriented to person, place, and time. She appears well-developed and well-nourished. No distress.  Eyes: Conjunctivae are normal. No scleral icterus.  Neck: Neck supple. No tracheal deviation present.  Cardiovascular: Normal rate and normal heart sounds.   Pulmonary/Chest: Effort normal and breath sounds normal. No respiratory distress.  Abdominal: Normal appearance. She exhibits no distension.  Musculoskeletal: She exhibits no edema.  Neurological: She is alert and oriented to person, place, and time.  Skin: Skin is warm and dry. No rash noted.  Psychiatric: She has a normal mood and affect.    ED Course  Procedures (including critical care time)    MDM   pts nephrologist called.  Dr Bascom Levels called back to state he will do hd orders, but requests [facility] see/admit for hd.  Labs ordered. Pt refuses labs. Discussed w [facility], will see/admit for hd.       Suzi Roots, MD 03/11/11 [phone]

## 2011-03-13 ENCOUNTER — Encounter (HOSPITAL_COMMUNITY): Payer: Self-pay | Admitting: Emergency Medicine

## 2011-03-13 ENCOUNTER — Observation Stay (HOSPITAL_COMMUNITY)
Admission: EM | Admit: 2011-03-13 | Discharge: 2011-03-13 | Disposition: A | Discharge status: Home / Self Care | Payer: Medicare HMO | Attending: Internal Medicine | Admitting: Internal Medicine

## 2011-03-13 ENCOUNTER — Inpatient Hospital Stay (HOSPITAL_COMMUNITY): Payer: Medicare HMO

## 2011-03-13 DIAGNOSIS — E119 Type 2 diabetes mellitus without complications: Secondary | ICD-10-CM | POA: Insufficient documentation

## 2011-03-13 DIAGNOSIS — N2581 Secondary hyperparathyroidism of renal origin: Secondary | ICD-10-CM | POA: Insufficient documentation

## 2011-03-13 DIAGNOSIS — D638 Anemia in other chronic diseases classified elsewhere: Secondary | ICD-10-CM | POA: Insufficient documentation

## 2011-03-13 DIAGNOSIS — Z992 Dependence on renal dialysis: Principal | ICD-10-CM | POA: Insufficient documentation

## 2011-03-13 DIAGNOSIS — I120 Hypertensive chronic kidney disease with stage 5 chronic kidney disease or end stage renal disease: Secondary | ICD-10-CM | POA: Insufficient documentation

## 2011-03-13 DIAGNOSIS — M109 Gout, unspecified: Secondary | ICD-10-CM | POA: Insufficient documentation

## 2011-03-13 DIAGNOSIS — E785 Hyperlipidemia, unspecified: Secondary | ICD-10-CM | POA: Insufficient documentation

## 2011-03-13 DIAGNOSIS — N186 End stage renal disease: Secondary | ICD-10-CM | POA: Insufficient documentation

## 2011-03-13 LAB — GLUCOSE, CAPILLARY: Glucose-Capillary: 114 mg/dL — ABNORMAL HIGH (ref 70–99)

## 2011-03-13 MED ORDER — RENA-VITE PO TABS
1.0000 | ORAL_TABLET | Freq: Every day | ORAL | Status: DC
  Filled 2011-03-13: qty 1

## 2011-03-13 MED ORDER — HEPARIN SODIUM (PORCINE) 1000 UNIT/ML DIALYSIS: 1000.0000 [IU] | INTRAMUSCULAR | Status: DC | PRN

## 2011-03-13 MED ORDER — NEPRO/CARBSTEADY PO LIQD: 237.0000 mL | ORAL | Status: DC | PRN

## 2011-03-13 MED ORDER — AMLODIPINE BESYLATE 5 MG PO TABS
5.0000 mg | ORAL_TABLET | Freq: Every day | ORAL | Status: DC
  Filled 2011-03-13: qty 1

## 2011-03-13 MED ORDER — SODIUM CHLORIDE 0.9 % IV SOLN: 100.0000 mL | INTRAVENOUS | Status: DC | PRN

## 2011-03-13 MED ORDER — DOCUSATE SODIUM 100 MG PO CAPS: 200.0000 mg | ORAL_CAPSULE | Freq: Every day | ORAL | Status: DC

## 2011-03-13 MED ORDER — PENTAFLUOROPROP-TETRAFLUOROETH EX AERO: 1.0000 "application " | INHALATION_SPRAY | CUTANEOUS | Status: DC | PRN

## 2011-03-13 MED ORDER — L-METHYLFOLATE-B6-B12 3-35-2 MG PO TABS
1.0000 | ORAL_TABLET | Freq: Two times a day (BID) | ORAL | Status: DC
  Filled 2011-03-13: qty 1

## 2011-03-13 MED ORDER — LIDOCAINE HCL (PF) 1 % IJ SOLN: 5.0000 mL | INTRAMUSCULAR | Status: DC | PRN

## 2011-03-13 MED ORDER — GLUCOSE 40 % PO GEL: 1.0000 | ORAL | Status: DC | PRN

## 2011-03-13 MED ORDER — ALLOPURINOL 100 MG PO TABS
100.0000 mg | ORAL_TABLET | Freq: Every day | ORAL | Status: DC
  Filled 2011-03-13: qty 1

## 2011-03-13 MED ORDER — LIDOCAINE-PRILOCAINE 2.5-2.5 % EX CREA: 1.0000 "application " | TOPICAL_CREAM | CUTANEOUS | Status: DC | PRN

## 2011-03-13 MED ORDER — REPAGLINIDE 0.5 MG PO TABS
0.5000 mg | ORAL_TABLET | Freq: Three times a day (TID) | ORAL | Status: DC
  Filled 2011-03-13 (×2): qty 1

## 2011-03-13 MED ORDER — INSULIN ASPART 100 UNIT/ML ~~LOC~~ SOLN
0.0000 [IU] | Freq: Three times a day (TID) | SUBCUTANEOUS | Status: DC
  Filled 2011-03-13: qty 3

## 2011-03-13 MED ORDER — HEPARIN SODIUM (PORCINE) 1000 UNIT/ML DIALYSIS: 20.0000 [IU]/kg | INTRAMUSCULAR | Status: DC | PRN

## 2011-03-13 MED ORDER — HYDRALAZINE HCL 50 MG PO TABS
75.0000 mg | ORAL_TABLET | Freq: Three times a day (TID) | ORAL | Status: DC
  Filled 2011-03-13: qty 1

## 2011-03-13 MED ORDER — ALTEPLASE 2 MG IJ SOLR: 2.0000 mg | Freq: Once | INTRAMUSCULAR | Status: DC | PRN

## 2011-03-13 MED ORDER — LOSARTAN POTASSIUM 50 MG PO TABS
50.0000 mg | ORAL_TABLET | Freq: Every day | ORAL | Status: DC
  Filled 2011-03-13: qty 1

## 2011-03-13 MED ORDER — METOPROLOL SUCCINATE ER 50 MG PO TB24
50.0000 mg | ORAL_TABLET | Freq: Every day | ORAL | Status: DC
  Filled 2011-03-13: qty 1

## 2011-03-13 MED ORDER — COLCHICINE 0.6 MG PO TABS
0.6000 mg | ORAL_TABLET | Freq: Every day | ORAL | Status: DC | PRN
  Filled 2011-03-13: qty 1

## 2011-03-13 MED ORDER — CALCIUM ACETATE 667 MG PO CAPS
1334.0000 mg | ORAL_CAPSULE | Freq: Three times a day (TID) | ORAL | Status: DC
  Filled 2011-03-13 (×2): qty 2

## 2011-03-13 NOTE — ED Notes (Signed)
Pt refusing to have blood drawn at this time, Pt states, "I always have my blood drawn when I am at dialysis."

## 2011-03-13 NOTE — ED Notes (Signed)
Dialysis contacted re: pt to receive hemodialysis, spoke with Belenda Cruise, RN who informed me that Dr. Bascom Levels the pt's nephrologist needs to be contacted, updated pt on status of care & was informed that Dr. Petra Kuba is on call this weekend for Dr. Bascom Levels, Pt lunch tray has been ordered, CBG 114, pt informed of status of care, NAD

## 2011-03-13 NOTE — ED Notes (Signed)
Pt here for dialysis treatment. 

## 2011-03-13 NOTE — ED Notes (Signed)
Spoke with Mathis Fare, RN on 6700 Re: report, to call the floor back in 5 mins.

## 2011-03-13 NOTE — Progress Notes (Signed)
Patient discharged home by dialysis RN after her dialysis session.M. Lynch notified of patient's d/c. Jayme Cloud 03/13/2011

## 2011-03-13 NOTE — ED Notes (Signed)
GLU level 114. RN notified

## 2011-03-13 NOTE — ED Notes (Signed)
Dr. Marya Landry at bedside

## 2011-03-13 NOTE — Discharge Summary (Signed)
Physician Discharge Summary  Patient ID: Quinesha Selinger MRN: [id number] DOB/AGE: 06/10/1945 65 y.o.  Admit date: 03/13/2011 Discharge date: 03/13/2011 Admission Diagnoses: End stage renal failure for hemodialysis  Discharge Diagnoses: End stage renal failure for hemodialysis   Discharged Condition: good  Hospital Course:  Ms. Norva Riffle was admitted 03/13/2011 for routine scheduled dialysis. She received hemodialysis as ordered. She tolerated the procedure without incidence and was discharge home at the completion of therapy  Consults: none Significant Diagnostic Studies: none  Treatments: dialysis: Hemodialysis  Discharge Exam: Blood pressure 178/74, pulse 72, temperature 97.3 F (36.3 C), temperature source Oral, resp. rate 20, weight 58.85 kg (129 lb 11.9 oz), SpO2 93.00%.   Disposition: discharged home at the completion of therapy  Discharge Orders    Future Orders Please Complete By Expires   Diet - low sodium heart healthy        Medication List  As of 04/16/2011  7:18 PM   TAKE these medications         allopurinol 100 MG tablet   Commonly known as: ZYLOPRIM   Take 100 mg by mouth daily.      amLODipine 5 MG tablet   Commonly known as: NORVASC   Take 5 mg by mouth at bedtime.      calcium acetate 667 MG capsule   Commonly known as: PHOSLO   Take 1,334 mg by mouth 3 (three) times daily with meals.      Coenzyme Q10 150 MG Caps   Take 300 mg by mouth daily.      colchicine 0.6 MG tablet   Take 0.6 mg by mouth daily as needed. For gout.      docusate sodium 100 MG capsule   Commonly known as: COLACE   Take 200 mg by mouth at bedtime.      hydrALAZINE 25 MG tablet   Commonly known as: APRESOLINE   Take 75 mg by mouth 3 (three) times daily.      l-methylfolate-B6-B12 3-35-2 MG Tabs   Commonly known as: METANX   Take 1 tablet by mouth 2 (two) times daily.      losartan 50 MG tablet   Commonly known as: COZAAR   Take 50 mg by mouth at bedtime.       metoprolol succinate 50 MG 24 hr tablet   Commonly known as: TOPROL-XL   Take 50 mg by mouth at bedtime.      multivitamin Tabs tablet   Take 1 tablet by mouth daily.      repaglinide 0.5 MG tablet   Commonly known as: PRANDIN   Take 0.5 mg by mouth 3 (three) times daily before meals.           Follow-up Information    Follow up with [name], MD .         SignedBurnadette Peter, Corrie Dandy A. 04/16/2011, 7:18 PM

## 2011-03-13 NOTE — ED Provider Notes (Addendum)
History     CSN: [id number]  Arrival date & time 03/13/11  1130   First MD Initiated Contact with Patient 03/13/11 1158      Chief Complaint  Patient presents with  . Vascular Access Problem    Pt here for dialysis    (Consider location/radiation/quality/duration/timing/severity/associated sxs/prior treatment) HPI Comments: The patient is coming in today for hemodialysis.  She has ESRD disease and gets dialysis every Tuesday, Thursday, and Saturday.  She is usually admitted by [facility] and then Dr. Bascom Levels comes and writes dialysis orders.  She does not have any complaints at this time.  She denies any chest pain, shortness of breath, nausea, or vomiting.  The history is provided by the patient.    Past Medical History  Diagnosis Date  . Chronic kidney disease     esrd  . Diabetes mellitus   . Hyperlipidemia   . Hypertension   . Renal disorder   . Dialysis care     tues, thurs, sat  . 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 Carotid endarterectomy 2002    left  . Btl   . Rectal fissurectomy   . Av fistula placement 10/30/2010    right brachiocephalic   . Fistulogram     Family History  Problem Relation Age of Onset  . COPD Mother   . Kidney disease Mother   . Heart disease Father   . Lymphoma Son     History  Substance Use Topics  . Smoking status: Current Everyday Smoker -- 1.0 packs/day for 50 years    Types: Cigarettes  . Smokeless tobacco: Never Used  . Alcohol Use: No    OB History    Grav Para Term Preterm Abortions TAB SAB Ect Mult Living                  Review of Systems  Constitutional: Negative for fever and chills.  Respiratory: Negative for shortness of breath.   Cardiovascular: Negative for chest pain.  Gastrointestinal: Negative for nausea, vomiting and abdominal pain.  Neurological: Negative for dizziness, syncope and headaches.    Allergies  Codeine; Epinephrine; and Percocet  Home Medications    Current Outpatient Rx  Name Route Sig Dispense Refill  . ALLOPURINOL 100 MG PO TABS Oral Take 100 mg by mouth daily.      Marland Kitchen AMLODIPINE BESYLATE 5 MG PO TABS Oral Take 5 mg by mouth daily.     Marland Kitchen CALCIUM ACETATE 667 MG PO CAPS Oral Take 1,334 mg by mouth 3 (three) times daily with meals.     Marland Kitchen COENZYME Q10 150 MG PO CAPS Oral Take 300 mg by mouth daily.     . COLCHICINE 0.6 MG PO TABS Oral Take 0.6 mg by mouth daily as needed. For gout.    Marland Kitchen DOCUSATE SODIUM 100 MG PO CAPS Oral Take 200 mg by mouth at bedtime.      Marland Kitchen HYDRALAZINE HCL 25 MG PO TABS Oral Take 75 mg by mouth 3 (three) times daily.     . L-METHYLFOLATE-B6-B12 3-35-2 MG PO TABS Oral Take 1 tablet by mouth 2 (two) times daily.     Marland Kitchen LOSARTAN POTASSIUM 50 MG PO TABS Oral Take 50 mg by mouth daily.      Marland Kitchen METOPROLOL SUCCINATE ER 50 MG PO TB24 Oral Take 50 mg by mouth daily.      Marland Kitchen RENA-VITE PO TABS Oral Take 1 tablet by  mouth daily.      Marland Kitchen REPAGLINIDE 0.5 MG PO TABS Oral Take 0.5 mg by mouth 3 (three) times daily before meals.        BP 161/47  Pulse 83  Temp(Src) 97.7 F (36.5 C) (Oral)  Resp 18  SpO2 99%  Physical Exam  Constitutional: She is oriented to person, place, and time. She appears well-developed and well-nourished. No distress.  HENT:  Head: Normocephalic and atraumatic.  Cardiovascular: Normal rate, regular rhythm and normal heart sounds.   Pulmonary/Chest: Effort normal and breath sounds normal. No respiratory distress. She has no wheezes.  Neurological: She is alert and oriented to person, place, and time.  Skin: Skin is warm and dry. No rash noted. She is not diaphoretic. No erythema.  Psychiatric: She has a normal mood and affect.    ED Course  Procedures (including critical care time)  Labs Reviewed  GLUCOSE, CAPILLARY - Abnormal; Notable for the following:    Glucose-Capillary 114 (*)    All other components within normal limits  POCT CBG MONITORING  CBC  DIFFERENTIAL  BASIC METABOLIC PANEL    No results found.   1. ESRD on hemodialysis    Discussed patient with Dr. Bascom Levels.  He stated that he will come write orders for hemodialysis and would like [facility] to admit patient.  1:09 PM Discussed patient with [facility].  He reports that he will come see patient in the ED and will admit patient.   MDM          Pascal Lux Wingen 03/13/11 1340  Glyn Zendejas Zenaida Niece Maralyn Sago 03/13/11 1931

## 2011-03-13 NOTE — ED Provider Notes (Signed)
Medical screening examination/treatment/procedure(s) were conducted as a shared visit with non-physician practitioner(s) and myself.  I personally evaluated the patient during the encounter  Ethelda Chick, MD 03/13/11 1536

## 2011-03-13 NOTE — H&P (Signed)
History and Physical Examination  Date: 03/13/2011  Patient name: Alexandra [name] Medical record number: [id number] Date of birth: Mar 11, 1946 Age: 65 y.o. Gender: female PCP: [name], MD, MD  Attending physician: Ethelda Chick, MD  Chief Complaint: Needs scheduled Hemodialysis Today  History of Present Illness: Alexandra [name] is an 65 y.o. female with end-stage renal disease who presented to the emergency department for scheduled hemodialysis today.  The patient normally comes to the emergency department Tuesdays Thursdays and Saturdays for scheduled hemodialysis with Dr. Leretha Dykes.  The patient reports that she feels fine.  She does not have any cough nausea vomiting fever chills rash or abdominal pain.  She reports that she intends to have hemodialysis today and to return on Monday because the dialysis center will be closed on New Year's Day.  Past Medical History  Diagnosis Date  . Chronic kidney disease     esrd  . Diabetes mellitus   . Hyperlipidemia   . Hypertension   . Renal disorder   . Dialysis care     tues, thurs, sat  . Gout due to renal impairment     Home Meds: Prior to Admission medications   Medication Sig Start Date End Date Taking? Authorizing Provider  allopurinol (ZYLOPRIM) 100 MG tablet Take 100 mg by mouth daily.     Yes Historical Provider, MD  amLODipine (NORVASC) 5 MG tablet Take 5 mg by mouth daily.    Yes Historical Provider, MD  calcium acetate (PHOSLO) 667 MG capsule Take 1,334 mg by mouth 3 (three) times daily with meals.    Yes Historical Provider, MD  Coenzyme Q10 150 MG CAPS Take 300 mg by mouth daily.    Yes Historical Provider, MD  colchicine 0.6 MG tablet Take 0.6 mg by mouth daily as needed. For gout.   Yes Historical Provider, MD  docusate sodium (COLACE) 100 MG capsule Take 200 mg by mouth at bedtime.     Yes Historical Provider, MD  hydrALAZINE (APRESOLINE) 25 MG tablet Take 75 mg by mouth 3 (three) times daily.    Yes  Historical Provider, MD  l-methylfolate-B6-B12 (METANX) 3-35-2 MG TABS Take 1 tablet by mouth 2 (two) times daily.    Yes Historical Provider, MD  losartan (COZAAR) 50 MG tablet Take 50 mg by mouth daily.     Yes Historical Provider, MD  metoprolol (TOPROL-XL) 50 MG 24 hr tablet Take 50 mg by mouth daily.     Yes Historical Provider, MD  multivitamin (RENA-VIT) TABS tablet Take 1 tablet by mouth daily.     Yes Historical Provider, MD  repaglinide (PRANDIN) 0.5 MG tablet Take 0.5 mg by mouth 3 (three) times daily before meals.     Yes Historical Provider, MD    Allergies: Codeine; Epinephrine; and Percocet  Social History:  History   Social History  . Marital Status: Single    Spouse Name: N/A    Number of Children: N/A  . Years of Education: N/A   Occupational History  . Not on file.   Social History Main Topics  . Smoking status: Current Everyday Smoker -- 1.0 packs/day for 50 years    Types: Cigarettes  . Smokeless tobacco: Never Used  . Alcohol Use: No  . Drug Use: No  . Sexually Active: Not on file   Other Topics Concern  . Not on file   Social History Narrative  . No narrative on file   Family History:  Family History  Problem Relation Age  of Onset  . COPD Mother   . Kidney disease Mother   . Heart disease Father   . Lymphoma Son    Past Surgical History:  Past Surgical History  Procedure Date  . Carotid endarterectomy 2002    left  . Btl   . Rectal fissurectomy   . Av fistula placement 10/30/2010    right brachiocephalic   . Fistulogram     Review of Systems: A comprehensive review of systems was negative.   Physical Exam: Blood pressure 161/47, pulse 83, temperature 97.7 F (36.5 C), temperature source Oral, resp. rate 18, SpO2 99.00%. BP 161/47  Pulse 83  Temp(Src) 97.7 F (36.5 C) (Oral)  Resp 18  SpO2 99%  General Appearance:    Alert, cooperative, no distress, appears stated age  Head:    Normocephalic, without obvious abnormality,  atraumatic  Eyes:    PERRL, conjunctiva/corneas clear, muddy sclera           Throat:   Lips, mucosa, and tongue normal; teeth and gums normal  Neck:   Supple, symmetrical, trachea midline, no adenopathy;    thyroid:  no enlargement/tenderness/nodules; no carotid   bruit or JVD     Lungs:     Clear to auscultation bilaterally, respirations unlabored  Chest Wall:    No tenderness or deformity   Heart:    Regular rate and rhythm, S1 and S2 normal, no murmur, rub   or gallop     Abdomen:     Soft, non-tender, bowel sounds active all four quadrants,    no masses, no organomegaly        Extremities:   Extremities normal, atraumatic, no cyanosis or edema  Pulses:   2+ and symmetric all extremities        Neurologic:   CNII-XII intact, normal strength, sensation and reflexes    throughout    Lab  And Imaging results:  Results for orders placed during the hospital encounter of 03/13/11 (from the past 48 hour(s))  GLUCOSE, CAPILLARY     Status: Abnormal   Collection Time   03/13/11 12:12 PM      Component Value Range Comment   Glucose-Capillary 114 (*) 70 - 99 (mg/dL)    No results found. EKG Results:  No orders found for this or any previous visit.   Impression End-stage renal disease Hypertension Diabetes mellitus type 2 Hyperlipidemia Anemia of chronic disease Gout Hyperparathyroidism-secondary  Plan  The patient is going to be admitted to a MedSurg bed for hemodialysis as ordered by the nephrologist Dr. Leretha Dykes.  Dr. Leretha Dykes has been contacted and is aware that the patient is in hospital and will provide hemodialysis orders for the patient at this time.  Will resume home medications and monitor blood glucose.  Alexandra [name] 03/13/2011, 1:34 PM [phone]

## 2011-03-14 NOTE — ED Provider Notes (Signed)
Medical screening examination/treatment/procedure(s) were conducted as a shared visit with non-physician practitioner(s) and myself.  I personally evaluated the patient during the encounter  Pt asymptomatic presenting requesting her regular dialysis.  Pt refuses to have blood drawn, Dr. Bascom Levels contacted who will write dialysis orders and pt to have labs drawn at dialysis  Ethelda Chick, MD 03/14/11 [phone]

## 2011-03-15 ENCOUNTER — Encounter (HOSPITAL_COMMUNITY): Payer: Self-pay | Admitting: *Deleted

## 2011-03-15 ENCOUNTER — Emergency Department (HOSPITAL_COMMUNITY)
Admission: EM | Admit: 2011-03-15 | Discharge: 2011-03-15 | Disposition: A | Discharge status: Home / Self Care | Payer: Medicare HMO | Attending: Emergency Medicine | Admitting: Emergency Medicine

## 2011-03-15 DIAGNOSIS — Z992 Dependence on renal dialysis: Secondary | ICD-10-CM | POA: Insufficient documentation

## 2011-03-15 DIAGNOSIS — E785 Hyperlipidemia, unspecified: Secondary | ICD-10-CM | POA: Diagnosis present

## 2011-03-15 DIAGNOSIS — Z79899 Other long term (current) drug therapy: Secondary | ICD-10-CM | POA: Insufficient documentation

## 2011-03-15 DIAGNOSIS — E119 Type 2 diabetes mellitus without complications: Secondary | ICD-10-CM | POA: Insufficient documentation

## 2011-03-15 DIAGNOSIS — F17200 Nicotine dependence, unspecified, uncomplicated: Secondary | ICD-10-CM | POA: Insufficient documentation

## 2011-03-15 DIAGNOSIS — I120 Hypertensive chronic kidney disease with stage 5 chronic kidney disease or end stage renal disease: Secondary | ICD-10-CM | POA: Insufficient documentation

## 2011-03-15 DIAGNOSIS — N186 End stage renal disease: Secondary | ICD-10-CM

## 2011-03-15 DIAGNOSIS — N2581 Secondary hyperparathyroidism of renal origin: Secondary | ICD-10-CM | POA: Diagnosis present

## 2011-03-15 DIAGNOSIS — D638 Anemia in other chronic diseases classified elsewhere: Secondary | ICD-10-CM | POA: Diagnosis present

## 2011-03-15 MED ORDER — HEPARIN SODIUM (PORCINE) 1000 UNIT/ML DIALYSIS
100.0000 [IU]/kg | INTRAMUSCULAR | Status: DC | PRN
  Filled 2011-03-15: qty 6

## 2011-03-15 NOTE — ED Notes (Signed)
Pt updated about wait for dialysis and states that she is going to sit here and wait.  Offered pt something to eat and she requested hot water to make tea.  States she needs a meal at 8pm.

## 2011-03-15 NOTE — ED Notes (Signed)
Dr. Freida Busman reporting patient can move to yellow. Dr. Bascom Levels at bedside

## 2011-03-15 NOTE — ED Notes (Signed)
Alexandra [name] from dialysis called and states that there is a wait for dialysis and that as long as pt's potassium is ok she will not be able to have it today.  States that Dr. Bascom Levels wants potassium level checked in ED and that IV team will need to come obtain labs.  I notified pt of same and she states that she is not going to have labs done in ED and that she is suppose to have dialysis today.  Pt calling Dr. Bascom Levels.

## 2011-03-15 NOTE — ED Notes (Signed)
Pt requesting to leave, refused all bloodwork and vital signs.  Left after being d/c by [facility] who had spoken with Dr. Bascom Levels.  No complaints, stated she needed to have dialysis before midnight or was not going to stay.

## 2011-03-15 NOTE — Discharge Summary (Signed)
Physician Discharge Summary  Patient ID: Alexandra [name] MRN: [id number] DOB/AGE: [birth year] 65 y.o.  Admit date: 03/15/2011 Discharge date: 03/15/2011  Admission Diagnoses: End stage renal disease Active Problems:  HTN (hypertension)  DM (diabetes mellitus)  Hyperparathyroidism, secondary renal  Hyperlipidemia  Anemia of chronic disease  Vasovagal syncope  Gout  Discharge Diagnoses:  Principal Problem:  *End stage renal disease Active Problems:  HTN (hypertension)  DM (diabetes mellitus)  Hyperparathyroidism, secondary renal  Hyperlipidemia  Anemia of chronic disease  Vasovagal syncope  Gout   Discharged Condition: good  Hospital Course: Pt demanding to be discharged upon learning that she will not be dialyzed until the early morning hours of 03/16/11.  Consults: nephrology  Significant Diagnostic Studies: none.  Pt demanding discharge. none Treatments: None. Pt demanding discharge. None Discharge Exam: Blood pressure 185/70, pulse 80, temperature 97.3 F (36.3 C), temperature source Oral, resp. rate 18, weight 58.514 kg (129 lb), SpO2 96.00%. See H&P performed 2/5 hours ago.  Disposition: Home or Self Care   Current Discharge Medication List    CONTINUE these medications which have NOT CHANGED   Details  allopurinol (ZYLOPRIM) 100 MG tablet Take 100 mg by mouth daily.      amLODipine (NORVASC) 5 MG tablet Take 5 mg by mouth daily.     calcium acetate (PHOSLO) 667 MG capsule Take 1,334 mg by mouth 3 (three) times daily with meals.     Coenzyme Q10 150 MG CAPS Take 300 mg by mouth daily.     colchicine 0.6 MG tablet Take 0.6 mg by mouth daily as needed. For gout.    docusate sodium (COLACE) 100 MG capsule Take 200 mg by mouth at bedtime.      hydrALAZINE (APRESOLINE) 25 MG tablet Take 75 mg by mouth 3 (three) times daily.     l-methylfolate-B6-B12 (METANX) 3-35-2 MG TABS Take 1 tablet by mouth 2 (two) times daily.     losartan (COZAAR) 50 MG  tablet Take 50 mg by mouth daily.      metoprolol (TOPROL-XL) 50 MG 24 hr tablet Take 50 mg by mouth daily.      multivitamin (RENA-VIT) TABS tablet Take 1 tablet by mouth daily.      repaglinide (PRANDIN) 0.5 MG tablet Take 0.5 mg by mouth 3 (three) times daily before meals.           SignedGerri Lins, Prather Failla 03/15/2011, 7:44 PM

## 2011-03-15 NOTE — H&P (Signed)
Alexandra [name] is an 65 y.o. female.   Chief Complaint: Need for hemodialysis. HPI: Pt is 65 yr old female who presents for inpatient hemodialysis.  Pt ordinarily is admitted for HD on Tuesdays, Thursdays and Saturdays, but she is coming today because tomorrow is a holiday.  Pt states that she is doing well and that there have been no changes in her status since her last HD.  She denies fevers, chills, cough, SOB, chest pain or nausea or vomiting.  Past Medical History  Diagnosis Date  . Chronic kidney disease     esrd  . Diabetes mellitus   . Hyperlipidemia   . Hypertension   . Renal disorder   . Dialysis care     tues, thurs, sat  . 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 Carotid endarterectomy 2002    left  . Btl   . Rectal fissurectomy   . Av fistula placement 10/30/2010    right brachiocephalic   . Fistulogram     Family History  Problem Relation Age of Onset  . COPD Mother   . Kidney disease Mother   . Heart disease Father   . Lymphoma Son    Social History:  reports that she has been smoking Cigarettes.  She has a 50 pack-year smoking history. She has never used smokeless tobacco. She reports that she does not drink alcohol or use illicit drugs. Medications Prior to Admission  Medication Dose Route Frequency Provider Last Rate Last Dose  . heparin injection 5,900 Units  100 Units/kg Dialysis PRN Jarome Matin, MD       Medications Prior to Admission  Medication Sig Dispense Refill  . allopurinol (ZYLOPRIM) 100 MG tablet Take 100 mg by mouth daily.        Marland Kitchen amLODipine (NORVASC) 5 MG tablet Take 5 mg by mouth daily.       . calcium acetate (PHOSLO) 667 MG capsule Take 1,334 mg by mouth 3 (three) times daily with meals.       . Coenzyme Q10 150 MG CAPS Take 300 mg by mouth daily.       . colchicine 0.6 MG tablet Take 0.6 mg by mouth daily as needed. For gout.      . docusate sodium (COLACE) 100 MG capsule Take 200 mg by  mouth at bedtime.        . hydrALAZINE (APRESOLINE) 25 MG tablet Take 75 mg by mouth 3 (three) times daily.       Marland Kitchen l-methylfolate-B6-B12 (METANX) 3-35-2 MG TABS Take 1 tablet by mouth 2 (two) times daily.       Marland Kitchen losartan (COZAAR) 50 MG tablet Take 50 mg by mouth daily.        . metoprolol (TOPROL-XL) 50 MG 24 hr tablet Take 50 mg by mouth daily.        . multivitamin (RENA-VIT) TABS tablet Take 1 tablet by mouth daily.        . repaglinide (PRANDIN) 0.5 MG tablet Take 0.5 mg by mouth 3 (three) times daily before meals.          Allergies:  Allergies  Allergen Reactions  . Codeine Other (See Comments)    Passes out  . Epinephrine Other (See Comments)    Tachycardia, diaphoresis, syncope  . Percocet (Oxycodone-Acetaminophen) Other (See Comments)    Passes  out    A comprehensive review of systems was negative.   General appearance:  alert, cooperative and appears stated age Head: Normocephalic, without obvious abnormality, atraumatic Eyes: conjunctivae/corneas clear. PERRL, EOM's intact. Fundi benign. Throat: lips, mucosa, and tongue normal; teeth and gums normal Neck: no adenopathy, no carotid bruit, no JVD, supple, symmetrical, trachea midline and thyroid not enlarged, symmetric, no tenderness/mass/nodules Resp: clear to auscultation bilaterally Chest wall: no tenderness Cardio: regular rate and rhythm, S1, S2 normal, no murmur, click, rub or gallop GI: soft, non-tender; bowel sounds normal; no masses,  no organomegaly Extremities: extremities normal, atraumatic, no cyanosis or edema Pulses: 2+ and symmetric Skin: Skin color, texture, turgor normal. No rashes or lesions Lymph nodes: Cervical, supraclavicular, and axillary nodes normal. Neurologic: Grossly normal   No results found for this or any previous visit (from the past 48 hour(s)). @[id number] @  Blood pressure 148/92, pulse 78, temperature 97.6 F (36.4 C), temperature source Oral, resp. rate 16, weight 58.514 kg  (129 lb), SpO2 100.00%.    Assessment/Plan 1.End-stage renal disease  - for HD.  Will order stat labs and consult nephrology. 2.Hypertension  3.Diabetes mellitus type 2  4.Hyperlipidemia  5.Anemia of chronic disease  6.Gout  7.Hyperparathyroidism-secondary   Tzion Wedel 03/15/2011, 6:00 PM

## 2011-03-15 NOTE — Consults (Signed)
Dialysis. Today.see orders.t

## 2011-03-15 NOTE — ED Notes (Signed)
Pt refuses EKG that was ordered at 1354.  Pt also refuses for IV team to draw potassium in ED.

## 2011-03-15 NOTE — ED Notes (Signed)
Pt denying any CP, SOB or abdominal pain. Stating here for dialysis. Lunch meal provided.

## 2011-03-15 NOTE — ED Notes (Signed)
Dr. Bascom Levels aware pt in ED. Reports after pt evaluated by EDP, page [facility] before consulting Dr. Bascom Levels.

## 2011-03-15 NOTE — ED Notes (Signed)
Pt reports beign here for dialysis, is tues/thur but can not have it done tomorrow. No distress noted at triage, no complaints.

## 2011-03-15 NOTE — ED Notes (Signed)
Pt very upset that she will not be able to be dialysised, pt states "I will not stay over night, I will not be here for New Years, I will call Dr Candelaria Celeste myself" pt then stated "Im being decriminated against"

## 2011-03-15 NOTE — ED Provider Notes (Signed)
History     CSN: [id number]  Arrival date & time 03/15/11  1128   First MD Initiated Contact with Patient 03/15/11 1352      Chief Complaint  Patient presents with  . Vascular Access Problem    (Consider location/radiation/quality/duration/timing/severity/associated sxs/prior treatment) HPI  Past Medical History  Diagnosis Date  . Chronic kidney disease     esrd  . Diabetes mellitus   . Hyperlipidemia   . Hypertension   . Renal disorder   . Dialysis care     tues, thurs, sat  . 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 Carotid endarterectomy 2002    left  . Btl   . Rectal fissurectomy   . Av fistula placement 10/30/2010    right brachiocephalic   . Fistulogram     Family History  Problem Relation Age of Onset  . COPD Mother   . Kidney disease Mother   . Heart disease Father   . Lymphoma Son     History  Substance Use Topics  . Smoking status: Current Everyday Smoker -- 1.0 packs/day for 50 years    Types: Cigarettes  . Smokeless tobacco: Never Used  . Alcohol Use: No    OB History    Grav Para Term Preterm Abortions TAB SAB Ect Mult Living                  Review of Systems  Allergies  Codeine; Epinephrine; and Percocet  Home Medications   Current Outpatient Rx  Name Route Sig Dispense Refill  . ALLOPURINOL 100 MG PO TABS Oral Take 100 mg by mouth daily.      Marland Kitchen AMLODIPINE BESYLATE 5 MG PO TABS Oral Take 5 mg by mouth daily.     Marland Kitchen CALCIUM ACETATE 667 MG PO CAPS Oral Take 1,334 mg by mouth 3 (three) times daily with meals.     Marland Kitchen COENZYME Q10 150 MG PO CAPS Oral Take 300 mg by mouth daily.     . COLCHICINE 0.6 MG PO TABS Oral Take 0.6 mg by mouth daily as needed. For gout.    Marland Kitchen DOCUSATE SODIUM 100 MG PO CAPS Oral Take 200 mg by mouth at bedtime.      Marland Kitchen HYDRALAZINE HCL 25 MG PO TABS Oral Take 75 mg by mouth 3 (three) times daily.     . L-METHYLFOLATE-B6-B12 3-35-2 MG PO TABS Oral Take 1 tablet by mouth 2 (two)  times daily.     Marland Kitchen LOSARTAN POTASSIUM 50 MG PO TABS Oral Take 50 mg by mouth daily.      Marland Kitchen METOPROLOL SUCCINATE ER 50 MG PO TB24 Oral Take 50 mg by mouth daily.      Marland Kitchen RENA-VITE PO TABS Oral Take 1 tablet by mouth daily.      Marland Kitchen REPAGLINIDE 0.5 MG PO TABS Oral Take 0.5 mg by mouth 3 (three) times daily before meals.        BP 148/92  Pulse 78  Temp(Src) 97.6 F (36.4 C) (Oral)  Resp 16  SpO2 100%  Physical Exam  ED Course  Procedures (including critical care time)   Labs Reviewed  I-STAT, CHEM 8   No results found.   1. End stage renal disease       MDM  Spoke with Dr. Leretha Dykes, he is requesting that the patient admitted by [facility] hospitalist. Spoke with Dr. Gerri Lins, and she will admit  Toy Baker, MD 03/15/11 [phone]

## 2011-03-18 ENCOUNTER — Emergency Department (HOSPITAL_COMMUNITY)
Admission: EM | Admit: 2011-03-18 | Discharge: 2011-03-19 | Discharge status: Against Medical Advice | Payer: Medicare HMO | Attending: Emergency Medicine | Admitting: Emergency Medicine

## 2011-03-18 ENCOUNTER — Other Ambulatory Visit: Payer: Self-pay | Admitting: Nephrology

## 2011-03-18 ENCOUNTER — Emergency Department (HOSPITAL_COMMUNITY): Payer: Medicare HMO

## 2011-03-18 ENCOUNTER — Encounter (HOSPITAL_COMMUNITY): Payer: Self-pay

## 2011-03-18 DIAGNOSIS — Z992 Dependence on renal dialysis: Secondary | ICD-10-CM | POA: Insufficient documentation

## 2011-03-18 DIAGNOSIS — N189 Chronic kidney disease, unspecified: Secondary | ICD-10-CM | POA: Insufficient documentation

## 2011-03-18 LAB — GLUCOSE, CAPILLARY: Glucose-Capillary: 149 mg/dL — ABNORMAL HIGH (ref 70–99)

## 2011-03-18 LAB — RENAL FUNCTION PANEL
Albumin: 3.3 g/dL — ABNORMAL LOW (ref 3.5–5.2)
CO2: 22 mEq/L (ref 19–32)
Calcium: 9.8 mg/dL (ref 8.4–10.5)
Creatinine, Ser: 13.16 mg/dL — ABNORMAL HIGH (ref 0.50–1.10)
GFR calc Af Amer: 3 mL/min — ABNORMAL LOW (ref >90)
GFR calc non Af Amer: 3 mL/min — ABNORMAL LOW (ref >90)
Sodium: 140 mEq/L (ref 135–145)

## 2011-03-18 LAB — CBC
MCH: 30.3 pg (ref 26.0–34.0)
MCV: 92.3 fL (ref 78.0–100.0)
Platelets: 185 10*3/uL (ref 150–400)
RBC: 4.02 MIL/uL (ref 3.87–5.11)
RDW: 19.2 % — ABNORMAL HIGH (ref 11.5–15.5)
WBC: 7.1 10*3/uL (ref 4.0–10.5)

## 2011-03-18 MED ORDER — DARBEPOETIN ALFA-POLYSORBATE 200 MCG/0.4ML IJ SOLN: 200.0000 ug | Freq: Once | INTRAMUSCULAR | Status: DC

## 2011-03-18 MED ORDER — PARICALCITOL 5 MCG/ML IV SOLN
1.0000 ug | Freq: Once | INTRAVENOUS | Status: AC
  Administered 2011-03-18: 1 ug via INTRAVENOUS

## 2011-03-18 MED ORDER — ONDANSETRON HCL 8 MG PO TABS: 4.0000 mg | ORAL_TABLET | Freq: Four times a day (QID) | ORAL | Status: DC | PRN

## 2011-03-18 MED ORDER — PARICALCITOL 5 MCG/ML IV SOLN
INTRAVENOUS | Status: AC
  Administered 2011-03-18: 1 ug via INTRAVENOUS
  Filled 2011-03-18: qty 1

## 2011-03-18 MED ORDER — CALCIUM CARBONATE 1250 MG/5ML PO SUSP: 500.0000 mg | Freq: Four times a day (QID) | ORAL | Status: DC | PRN

## 2011-03-18 MED ORDER — NEPRO/CARBSTEADY PO LIQD: 237.0000 mL | Freq: Three times a day (TID) | ORAL | Status: DC | PRN

## 2011-03-18 MED ORDER — ONDANSETRON HCL 4 MG/2ML IJ SOLN: 4.0000 mg | Freq: Four times a day (QID) | INTRAMUSCULAR | Status: DC | PRN

## 2011-03-18 MED ORDER — CAMPHOR-MENTHOL 0.5-0.5 % EX LOTN: 1.0000 "application " | TOPICAL_LOTION | Freq: Three times a day (TID) | CUTANEOUS | Status: DC | PRN

## 2011-03-18 MED ORDER — SORBITOL 70 % SOLN: 30.0000 mL | Status: DC | PRN

## 2011-03-18 MED ORDER — HEPARIN SODIUM (PORCINE) 1000 UNIT/ML DIALYSIS
20.0000 [IU]/kg | INTRAMUSCULAR | Status: DC | PRN
  Administered 2011-03-18: 1200 [IU] via INTRAVENOUS_CENTRAL

## 2011-03-18 MED ORDER — DOCUSATE SODIUM 283 MG RE ENEM: 1.0000 | ENEMA | RECTAL | Status: DC | PRN

## 2011-03-18 NOTE — ED Notes (Signed)
Spoke with Dr., Bascom Levels, He stated, " I will find her"

## 2011-03-18 NOTE — ED Notes (Signed)
Pt. Is here for dilaysis. Pt. Denies any pain or discomfort.

## 2011-03-20 ENCOUNTER — Emergency Department (HOSPITAL_COMMUNITY): Admit: 2011-03-20 | Discharge: 2011-03-20 | Disposition: A | Discharge status: Home / Self Care | Payer: Medicare HMO

## 2011-03-20 ENCOUNTER — Emergency Department (HOSPITAL_COMMUNITY)
Admission: EM | Admit: 2011-03-20 | Discharge: 2011-03-20 | Disposition: A | Discharge status: Home / Self Care | Payer: Medicare HMO | Attending: Internal Medicine | Admitting: Internal Medicine

## 2011-03-20 ENCOUNTER — Encounter (HOSPITAL_COMMUNITY): Payer: Self-pay

## 2011-03-20 DIAGNOSIS — I120 Hypertensive chronic kidney disease with stage 5 chronic kidney disease or end stage renal disease: Secondary | ICD-10-CM | POA: Insufficient documentation

## 2011-03-20 DIAGNOSIS — R5381 Other malaise: Secondary | ICD-10-CM | POA: Insufficient documentation

## 2011-03-20 DIAGNOSIS — N186 End stage renal disease: Secondary | ICD-10-CM | POA: Insufficient documentation

## 2011-03-20 LAB — CBC
HCT: 37.8 % (ref 36.0–46.0)
Hemoglobin: 12.3 g/dL (ref 12.0–15.0)
MCH: 30.4 pg (ref 26.0–34.0)
MCHC: 32.5 g/dL (ref 30.0–36.0)

## 2011-03-20 LAB — RENAL FUNCTION PANEL
BUN: 62 mg/dL — ABNORMAL HIGH (ref 6–23)
Glucose, Bld: 145 mg/dL — ABNORMAL HIGH (ref 70–99)
Phosphorus: 4.6 mg/dL (ref 2.3–4.6)
Potassium: 4.5 mEq/L (ref 3.5–5.1)

## 2011-03-20 MED ORDER — LIDOCAINE HCL (PF) 1 % IJ SOLN: 5.0000 mL | INTRAMUSCULAR | Status: DC | PRN

## 2011-03-20 MED ORDER — HEPARIN SODIUM (PORCINE) 1000 UNIT/ML DIALYSIS: 1000.0000 [IU] | INTRAMUSCULAR | Status: DC | PRN

## 2011-03-20 MED ORDER — PARICALCITOL 5 MCG/ML IV SOLN
1.0000 ug | INTRAVENOUS | Status: DC
  Administered 2011-03-20: 1 ug via INTRAVENOUS

## 2011-03-20 MED ORDER — LIDOCAINE-PRILOCAINE 2.5-2.5 % EX CREA: 1.0000 "application " | TOPICAL_CREAM | CUTANEOUS | Status: DC | PRN

## 2011-03-20 MED ORDER — NEPRO/CARBSTEADY PO LIQD: 237.0000 mL | ORAL | Status: DC | PRN

## 2011-03-20 MED ORDER — HEPARIN SODIUM (PORCINE) 1000 UNIT/ML DIALYSIS
20.0000 [IU]/kg | INTRAMUSCULAR | Status: DC | PRN
  Administered 2011-03-20: 1300 [IU] via INTRAVENOUS_CENTRAL

## 2011-03-20 MED ORDER — SODIUM CHLORIDE 0.9 % IV SOLN: 100.0000 mL | INTRAVENOUS | Status: DC | PRN

## 2011-03-20 MED ORDER — ALTEPLASE 2 MG IJ SOLR: 2.0000 mg | Freq: Once | INTRAMUSCULAR | Status: DC | PRN

## 2011-03-20 MED ORDER — PENTAFLUOROPROP-TETRAFLUOROETH EX AERO: 1.0000 "application " | INHALATION_SPRAY | CUTANEOUS | Status: DC | PRN

## 2011-03-20 MED ORDER — PARICALCITOL 5 MCG/ML IV SOLN
INTRAVENOUS | Status: AC
  Administered 2011-03-20: 1 ug via INTRAVENOUS
  Filled 2011-03-20: qty 1

## 2011-03-20 NOTE — H&P (Signed)
Alexandra [name] is an 66 y.o. female.   Chief Complaint: hemodialysis  HPI: the pt is a very pleasant AA female who presents to the ED for HD.  She says that she was too weak to go to the outpt center today and that is why she came to the ED.  Dr Audrie Lia is aware and will dialyze her here after which the pt will be discharged to her home.  She has no complaints except that she is tired and weak today.  Past Medical History  Diagnosis Date  . Chronic kidney disease     esrd  . Diabetes mellitus   . Hyperlipidemia   . Hypertension   . Renal disorder   . Dialysis care     tues, thurs, sat  . 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 Carotid endarterectomy 2002    left  . Btl   . Rectal fissurectomy   . Av fistula placement 10/30/2010    right brachiocephalic   . Fistulogram     Family History  Problem Relation Age of Onset  . COPD Mother   . Kidney disease Mother   . Heart disease Father   . Lymphoma Son    Social History:  reports that she has been smoking Cigarettes.  She has a 50 pack-year smoking history. She has never used smokeless tobacco. She reports that she does not drink alcohol or use illicit drugs.  Allergies:  Allergies  Allergen Reactions  . Codeine Other (See Comments)    Passes out  . Epinephrine Other (See Comments)    Tachycardia, diaphoresis, syncope  . Percocet (Oxycodone-Acetaminophen) Other (See Comments)    Passes  out    Medications Prior to Admission  Medication Dose Route Frequency Provider Last Rate Last Dose  . DISCONTD: calcium carbonate (dosed in mg elemental calcium) suspension 500 mg of elemental calcium  500 mg of elemental calcium Oral Q6H PRN Jarome Matin, MD      . DISCONTD: camphor-menthol [facility]) lotion 1 application  1 application Topical Q8H PRN Jarome Matin, MD      . DISCONTD: docusate sodium [facility]) enema 283 mg  1 enema Rectal PRN Jarome Matin, MD      . DISCONTD:  feeding supplement (NEPRO CARB STEADY) liquid 237 mL  237 mL Oral TID PRN Jarome Matin, MD      . DISCONTD: heparin injection 1,200 Units  20 Units/kg Dialysis PRN Jarome Matin, MD   1,200 Units at 03/18/11 1713  . DISCONTD: ondansetron (ZOFRAN) injection 4 mg  4 mg Intravenous Q6H PRN Jarome Matin, MD      . DISCONTD: ondansetron [facility]) tablet 4 mg  4 mg Oral Q6H PRN Jarome Matin, MD      . DISCONTD: sorbitol 70 % solution 30 mL  30 mL Oral PRN Jarome Matin, MD       Medications Prior to Admission  Medication Sig Dispense Refill  . allopurinol (ZYLOPRIM) 100 MG tablet Take 100 mg by mouth daily.        Marland Kitchen amLODipine (NORVASC) 5 MG tablet Take 5 mg by mouth daily.       . calcium acetate (PHOSLO) 667 MG capsule Take 1,334 mg by mouth 3 (three) times daily with meals.       . Coenzyme Q10 150 MG CAPS Take 300 mg by mouth daily.       . colchicine  0.6 MG tablet Take 0.6 mg by mouth daily as needed. For gout.      . docusate sodium (COLACE) 100 MG capsule Take 200 mg by mouth at bedtime.        . hydrALAZINE (APRESOLINE) 25 MG tablet Take 75 mg by mouth 3 (three) times daily.       Marland Kitchen l-methylfolate-B6-B12 (METANX) 3-35-2 MG TABS Take 1 tablet by mouth 2 (two) times daily.       Marland Kitchen losartan (COZAAR) 50 MG tablet Take 50 mg by mouth daily.        . metoprolol (TOPROL-XL) 50 MG 24 hr tablet Take 50 mg by mouth daily.        . multivitamin (RENA-VIT) TABS tablet Take 1 tablet by mouth daily.        . repaglinide (PRANDIN) 0.5 MG tablet Take 0.5 mg by mouth 3 (three) times daily before meals.          Results for orders placed during the hospital encounter of 03/18/11 (from the past 48 hour(s))  GLUCOSE, CAPILLARY     Status: Abnormal   Collection Time   03/18/11  2:20 PM      Component Value Range Comment   Glucose-Capillary 149 (*) 70 - 99 (mg/dL)   CBC     Status: Abnormal   Collection Time   03/18/11  6:05 PM      Component Value Range Comment   WBC 7.1  4.0 - 10.5  (K/uL)    RBC 4.02  3.87 - 5.11 (MIL/uL)    Hemoglobin 12.2  12.0 - 15.0 (g/dL)    HCT 24.4  01.0 - 27.2 (%)    MCV 92.3  78.0 - 100.0 (fL)    MCH 30.3  26.0 - 34.0 (pg)    MCHC 32.9  30.0 - 36.0 (g/dL)    RDW 53.6 (*) 64.4 - 15.5 (%)    Platelets 185  150 - 400 (K/uL)   RENAL FUNCTION PANEL     Status: Abnormal   Collection Time   03/18/11  6:06 PM      Component Value Range Comment   Sodium 140  135 - 145 (mEq/L)    Potassium 4.8  3.5 - 5.1 (mEq/L)    Chloride 96  96 - 112 (mEq/L)    CO2 22  19 - 32 (mEq/L)    Glucose, Bld 129 (*) 70 - 99 (mg/dL)    BUN 034 (*) 6 - 23 (mg/dL)    Creatinine, Ser 74.25 (*) 0.50 - 1.10 (mg/dL)    Calcium 9.8  8.4 - 10.5 (mg/dL)    Phosphorus 4.8 (*) 2.3 - 4.6 (mg/dL)    Albumin 3.3 (*) 3.5 - 5.2 (g/dL)    GFR calc non Af Amer 3 (*) >90 (mL/min)    GFR calc Af Amer 3 (*) >90 (mL/min)    No results found.  Review of Systems  Constitutional: Positive for malaise/fatigue. Negative for fever, chills, weight loss and diaphoresis.  Respiratory: Negative for cough, shortness of breath and wheezing.   Cardiovascular: Negative for chest pain, leg swelling and PND.  Gastrointestinal: Negative.   Genitourinary: Negative.   Musculoskeletal: Negative.   Skin: Negative for itching and rash.  Neurological: Positive for weakness. Negative for headaches.  Psychiatric/Behavioral: Negative.     Blood pressure 160/76, pulse 80, temperature 97.8 F (36.6 C), temperature source Oral, resp. rate 20, SpO2 99.00%. Physical Exam  Constitutional: She is oriented to person, place, and time. She appears well-developed  and well-nourished. No distress.  HENT:  Head: Normocephalic and atraumatic.  Neck: Normal range of motion. Neck supple. No JVD present. No tracheal deviation present. No thyromegaly present.  Cardiovascular: Normal rate and regular rhythm.  Exam reveals no gallop and no friction rub.   No murmur heard. Respiratory: Effort normal and breath sounds  normal. No stridor.  GI: Soft. Bowel sounds are normal.  Musculoskeletal: Normal range of motion. She exhibits no edema and no tenderness.  Neurological: She is alert and oriented to person, place, and time.  Skin: Skin is warm and dry. No rash noted. She is not diaphoretic. No erythema. No pallor.  Psychiatric: She has a normal mood and affect. Her behavior is normal. Judgment and thought content normal.     Assessment/Plan 1.  ESRD on HD- the pt is just here to get her HD because she missed her HD treatment today in the outpt setting, she said she was too weak to go.  Has no other complaints and will be treated accordingly with all the home meds that she normally takes  Melene Plan 03/20/2011, 1:48 PM

## 2011-03-20 NOTE — ED Notes (Signed)
Pt. Is here for dialysis 

## 2011-03-20 NOTE — ED Notes (Signed)
Spoke with Dr. Chestine Spore (covering for Dr. Bascom Levels) to inform that pt is in ED and here for dialysis.

## 2011-03-20 NOTE — Progress Notes (Signed)
Patient ID: Alexandra [name], female   DOB: 1945-04-20, 66 y.o.   MRN: [id number] Pt.  In for dialysis. She feels weak.

## 2011-03-20 NOTE — ED Notes (Signed)
Resting quietly in bed. Meal tray provided. No complaints at this time. Awaiting dialysis

## 2011-03-23 ENCOUNTER — Emergency Department (HOSPITAL_COMMUNITY): Payer: Medicare HMO

## 2011-03-23 ENCOUNTER — Emergency Department (HOSPITAL_COMMUNITY)
Admission: EM | Admit: 2011-03-23 | Discharge: 2011-03-23 | Disposition: A | Discharge status: Home / Self Care | Payer: Medicare HMO | Attending: Emergency Medicine | Admitting: Emergency Medicine

## 2011-03-23 ENCOUNTER — Encounter: Payer: Self-pay | Admitting: Nephrology

## 2011-03-23 ENCOUNTER — Encounter (HOSPITAL_COMMUNITY): Payer: Self-pay

## 2011-03-23 DIAGNOSIS — N186 End stage renal disease: Secondary | ICD-10-CM | POA: Insufficient documentation

## 2011-03-23 DIAGNOSIS — E119 Type 2 diabetes mellitus without complications: Secondary | ICD-10-CM | POA: Insufficient documentation

## 2011-03-23 DIAGNOSIS — Z992 Dependence on renal dialysis: Secondary | ICD-10-CM | POA: Insufficient documentation

## 2011-03-23 DIAGNOSIS — I120 Hypertensive chronic kidney disease with stage 5 chronic kidney disease or end stage renal disease: Secondary | ICD-10-CM | POA: Insufficient documentation

## 2011-03-23 LAB — DIFFERENTIAL
Lymphocytes Relative: 35 % (ref 12–46)
Lymphs Abs: 2.1 10*3/uL (ref 0.7–4.0)
Monocytes Absolute: 0.4 10*3/uL (ref 0.1–1.0)
Monocytes Relative: 7 % (ref 3–12)
Neutro Abs: 3.4 10*3/uL (ref 1.7–7.7)

## 2011-03-23 LAB — RENAL FUNCTION PANEL
BUN: 57 mg/dL — ABNORMAL HIGH (ref 6–23)
CO2: 25 mEq/L (ref 19–32)
Chloride: 99 mEq/L (ref 96–112)
Creatinine, Ser: 8.89 mg/dL — ABNORMAL HIGH (ref 0.50–1.10)
Glucose, Bld: 150 mg/dL — ABNORMAL HIGH (ref 70–99)

## 2011-03-23 LAB — CBC
HCT: 35.3 % — ABNORMAL LOW (ref 36.0–46.0)
Hemoglobin: 11.3 g/dL — ABNORMAL LOW (ref 12.0–15.0)
MCHC: 32 g/dL (ref 30.0–36.0)
RBC: 3.76 MIL/uL — ABNORMAL LOW (ref 3.87–5.11)
WBC: 6.1 10*3/uL (ref 4.0–10.5)

## 2011-03-23 LAB — GLUCOSE, CAPILLARY: Glucose-Capillary: 96 mg/dL (ref 70–99)

## 2011-03-23 MED ORDER — HEPARIN SODIUM (PORCINE) 1000 UNIT/ML DIALYSIS
20.0000 [IU]/kg | INTRAMUSCULAR | Status: DC | PRN
  Administered 2011-03-23: 1200 [IU] via INTRAVENOUS_CENTRAL
  Filled 2011-03-23: qty 2

## 2011-03-23 MED ORDER — DARBEPOETIN ALFA-POLYSORBATE 200 MCG/0.4ML IJ SOLN
INTRAMUSCULAR | Status: AC
  Administered 2011-03-23: 200 ug via INTRAVENOUS
  Filled 2011-03-23: qty 0.4

## 2011-03-23 MED ORDER — PARICALCITOL 5 MCG/ML IV SOLN: 1.0000 ug | Freq: Once | INTRAVENOUS | Status: DC

## 2011-03-23 MED ORDER — PARICALCITOL 5 MCG/ML IV SOLN
INTRAVENOUS | Status: AC
  Administered 2011-03-23: 1 ug via INTRAVENOUS_CENTRAL
  Filled 2011-03-23: qty 1

## 2011-03-23 MED ORDER — DARBEPOETIN ALFA-POLYSORBATE 200 MCG/0.4ML IJ SOLN
200.0000 ug | Freq: Once | INTRAMUSCULAR | Status: AC
  Administered 2011-03-23: 200 ug via INTRAVENOUS

## 2011-03-23 NOTE — ED Notes (Signed)
Pt. Is here for dialysis 

## 2011-03-23 NOTE — ED Notes (Signed)
Ordered renal diet for lunch-will deliver to triage

## 2011-03-23 NOTE — ED Notes (Signed)
Paged Dr Bascom Levels to [facility] Triage RN

## 2011-03-25 ENCOUNTER — Inpatient Hospital Stay (HOSPITAL_COMMUNITY): Payer: Medicare HMO

## 2011-03-25 ENCOUNTER — Emergency Department (HOSPITAL_COMMUNITY): Admit: 2011-03-25 | Discharge: 2011-03-25 | Disposition: A | Discharge status: Home / Self Care | Payer: Medicare HMO

## 2011-03-25 ENCOUNTER — Emergency Department (HOSPITAL_COMMUNITY)
Admission: EM | Admit: 2011-03-25 | Discharge: 2011-03-25 | Disposition: A | Discharge status: Home / Self Care | Payer: Medicare HMO | Attending: Nephrology | Admitting: Nephrology

## 2011-03-25 ENCOUNTER — Encounter (HOSPITAL_COMMUNITY): Payer: Self-pay

## 2011-03-25 DIAGNOSIS — I120 Hypertensive chronic kidney disease with stage 5 chronic kidney disease or end stage renal disease: Secondary | ICD-10-CM | POA: Insufficient documentation

## 2011-03-25 DIAGNOSIS — Z992 Dependence on renal dialysis: Secondary | ICD-10-CM | POA: Insufficient documentation

## 2011-03-25 DIAGNOSIS — M109 Gout, unspecified: Secondary | ICD-10-CM | POA: Insufficient documentation

## 2011-03-25 DIAGNOSIS — E785 Hyperlipidemia, unspecified: Secondary | ICD-10-CM | POA: Insufficient documentation

## 2011-03-25 DIAGNOSIS — E119 Type 2 diabetes mellitus without complications: Secondary | ICD-10-CM | POA: Insufficient documentation

## 2011-03-25 DIAGNOSIS — N186 End stage renal disease: Secondary | ICD-10-CM | POA: Insufficient documentation

## 2011-03-25 LAB — RENAL FUNCTION PANEL
Albumin: 3.7 g/dL (ref 3.5–5.2)
BUN: 47 mg/dL — ABNORMAL HIGH (ref 6–23)
Chloride: 100 mEq/L (ref 96–112)
GFR calc Af Amer: 6 mL/min — ABNORMAL LOW (ref >90)
Glucose, Bld: 88 mg/dL (ref 70–99)
Phosphorus: 3.5 mg/dL (ref 2.3–4.6)
Potassium: 4.5 mEq/L (ref 3.5–5.1)
Sodium: 139 mEq/L (ref 135–145)

## 2011-03-25 LAB — GLUCOSE, CAPILLARY: Glucose-Capillary: 99 mg/dL (ref 70–99)

## 2011-03-25 LAB — CBC
HCT: 40.2 % (ref 36.0–46.0)
Hemoglobin: 13 g/dL (ref 12.0–15.0)
MCHC: 32.3 g/dL (ref 30.0–36.0)
WBC: 7 10*3/uL (ref 4.0–10.5)

## 2011-03-25 MED ORDER — HEPARIN SODIUM (PORCINE) 1000 UNIT/ML DIALYSIS
20.0000 [IU]/kg | INTRAMUSCULAR | Status: DC | PRN
  Filled 2011-03-25: qty 2

## 2011-03-25 NOTE — ED Notes (Signed)
Medical screening exam:  66 year old female comes emergency department for dialysis. This is a scheduled dialysis and there is no special problem that she is having her period her dialysis access catheters are in the right subclavian area. Lungs are clear and heart has regular rate and rhythm. She does not appear overtly volume overloaded. Dr. Leretha Dykes is to make arrangements for her dialysis.  Dione Booze, MD 03/25/11 1538

## 2011-03-25 NOTE — ED Notes (Signed)
Pt presents with needing dialysis today. She is alert and oriented.

## 2011-03-25 NOTE — Discharge Summary (Signed)
Physician Discharge Summary  Patient ID: Alexandra [name] MRN: [id number] DOB/AGE: 11-28-1945 66 y.o.  Admit date: 03/06/2011 Discharge date: 03/25/2011  Admission Diagnoses:  End stage renal disease admitted for hemodialysis  Discharge Diagnoses: End stage renal disease admitted for hemodialys Principal Problem:  *End stage renal disease Active Problems:  HTN (hypertension)  DM (diabetes mellitus)  Hyperparathyroidism, secondary renal   Discharged Condition: good  Hospital Course: Ms. Alexandra [name] was admitted to observation in the hemodialysis unit for routine hemodialysis. She underwent dialysis without difficulty and was discharged home.  Consults: none  Significant Diagnostic Studies: none  Treatments: dialysis: Hemodialysis  Discharge Exam: Blood pressure 171/67, pulse 86, temperature 98.4 F (36.9 C), temperature source Oral, resp. rate 20, weight 59.35 kg (130 lb 13.5 oz), SpO2 94.00%.   Disposition: Home or Self Care   Medication List  As of 03/25/2011  8:41 PM   CONTINUE taking these medications         allopurinol 100 MG tablet   Commonly known as: ZYLOPRIM      amLODipine 5 MG tablet   Commonly known as: NORVASC      calcium acetate 667 MG capsule   Commonly known as: PHOSLO      Coenzyme Q10 150 MG Caps      colchicine 0.6 MG tablet      docusate sodium 100 MG capsule   Commonly known as: COLACE      hydrALAZINE 25 MG tablet   Commonly known as: APRESOLINE      l-methylfolate-B6-B12 3-35-2 MG Tabs   Commonly known as: METANX      losartan 50 MG tablet   Commonly known as: COZAAR      metoprolol 50 MG 24 hr tablet   Commonly known as: TOPROL-XL      multivitamin Tabs tablet      repaglinide 0.5 MG tablet   Commonly known as: PRANDIN           Follow-up Information    Follow up with [name], MD .         SignedBurnadette Peter, Tresean Mattix A. 03/25/2011, 8:41 PM

## 2011-03-25 NOTE — ED Notes (Signed)
CBG IS 99MG /DL

## 2011-03-26 ENCOUNTER — Ambulatory Visit (HOSPITAL_COMMUNITY): Payer: Medicare HMO

## 2011-03-27 ENCOUNTER — Emergency Department (HOSPITAL_COMMUNITY): Admit: 2011-03-27 | Discharge: 2011-03-27 | Disposition: A | Discharge status: Home / Self Care | Payer: Medicare HMO

## 2011-03-27 ENCOUNTER — Observation Stay (HOSPITAL_COMMUNITY)
Admission: EM | Admit: 2011-03-27 | Discharge: 2011-03-27 | Disposition: A | Discharge status: Home / Self Care | Payer: Medicare HMO | Attending: Internal Medicine | Admitting: Internal Medicine

## 2011-03-27 ENCOUNTER — Encounter (HOSPITAL_COMMUNITY): Payer: Self-pay | Admitting: Emergency Medicine

## 2011-03-27 DIAGNOSIS — N186 End stage renal disease: Secondary | ICD-10-CM | POA: Insufficient documentation

## 2011-03-27 DIAGNOSIS — D638 Anemia in other chronic diseases classified elsewhere: Secondary | ICD-10-CM | POA: Diagnosis present

## 2011-03-27 DIAGNOSIS — Z992 Dependence on renal dialysis: Secondary | ICD-10-CM | POA: Insufficient documentation

## 2011-03-27 DIAGNOSIS — I120 Hypertensive chronic kidney disease with stage 5 chronic kidney disease or end stage renal disease: Principal | ICD-10-CM | POA: Insufficient documentation

## 2011-03-27 DIAGNOSIS — E119 Type 2 diabetes mellitus without complications: Secondary | ICD-10-CM | POA: Insufficient documentation

## 2011-03-27 LAB — RENAL FUNCTION PANEL
Albumin: 3.5 g/dL (ref 3.5–5.2)
CO2: 26 mEq/L (ref 19–32)
Chloride: 96 mEq/L (ref 96–112)
GFR calc non Af Amer: 5 mL/min — ABNORMAL LOW (ref >90)
Potassium: 4 mEq/L (ref 3.5–5.1)

## 2011-03-27 LAB — CBC
HCT: 39.3 % (ref 36.0–46.0)
Platelets: 195 10*3/uL (ref 150–400)
RBC: 4.17 MIL/uL (ref 3.87–5.11)
RDW: 17.9 % — ABNORMAL HIGH (ref 11.5–15.5)
WBC: 7.1 10*3/uL (ref 4.0–10.5)

## 2011-03-27 MED ORDER — HEPARIN SODIUM (PORCINE) 1000 UNIT/ML DIALYSIS
20.0000 [IU]/kg | INTRAMUSCULAR | Status: DC | PRN
  Administered 2011-03-27: 1200 [IU] via INTRAVENOUS_CENTRAL
  Filled 2011-03-27: qty 2

## 2011-03-27 NOTE — ED Notes (Signed)
Pt. Stated, i'm here to get diaylsis , and i need to go ahead and get food, you see I'm eating crackers.

## 2011-03-27 NOTE — ED Provider Notes (Signed)
History     CSN: [id number]  Arrival date & time 03/27/11  1433   First MD Initiated Contact with Patient 03/27/11 1623      No chief complaint on file.   (Consider location/radiation/quality/duration/timing/severity/associated sxs/prior treatment) The history is provided by the patient.  Pt here for HD. No complaints. She states she discussed with Dr Audrie Lia who will dialyze her once she is admitted. Specifically she denied, fever, SOB, cough, CP, leg swelling.    Past Medical History  Diagnosis Date  . Chronic kidney disease     esrd  . Diabetes mellitus   . Hyperlipidemia   . Hypertension   . Renal disorder   . Dialysis care     tues, thurs, sat  . 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 Carotid endarterectomy 2002    left  . Btl   . Rectal fissurectomy   . Av fistula placement 10/30/2010    right brachiocephalic   . Fistulogram     Family History  Problem Relation Age of Onset  . COPD Mother   . Kidney disease Mother   . Heart disease Father   . Lymphoma Son     History  Substance Use Topics  . Smoking status: Current Everyday Smoker -- 1.0 packs/day for 50 years    Types: Cigarettes  . Smokeless tobacco: Never Used  . Alcohol Use: No    OB History    Grav Para Term Preterm Abortions TAB SAB Ect Mult Living                  Review of Systems  Constitutional: Negative.   HENT: Negative.   Eyes: Negative.   Respiratory: Negative.   Cardiovascular: Negative.   Gastrointestinal: Negative.   Genitourinary: Negative.   Musculoskeletal: Negative.   Neurological: Negative.     Allergies  Codeine; Epinephrine; and Percocet  Home Medications   Current Outpatient Rx  Name Route Sig Dispense Refill  . ALLOPURINOL 100 MG PO TABS Oral Take 100 mg by mouth daily.      Marland Kitchen AMLODIPINE BESYLATE 5 MG PO TABS Oral Take 5 mg by mouth at bedtime.     Marland Kitchen CALCIUM ACETATE 667 MG PO CAPS Oral Take 1,334 mg by mouth 3 (three)  times daily with meals.     Marland Kitchen COENZYME Q10 150 MG PO CAPS Oral Take 300 mg by mouth daily.     . COLCHICINE 0.6 MG PO TABS Oral Take 0.6 mg by mouth daily as needed. For gout.    Marland Kitchen DOCUSATE SODIUM 100 MG PO CAPS Oral Take 200 mg by mouth at bedtime.      Marland Kitchen HYDRALAZINE HCL 25 MG PO TABS Oral Take 75 mg by mouth 3 (three) times daily.     . L-METHYLFOLATE-B6-B12 3-35-2 MG PO TABS Oral Take 1 tablet by mouth 2 (two) times daily.     Marland Kitchen LOSARTAN POTASSIUM 50 MG PO TABS Oral Take 50 mg by mouth at bedtime.     Marland Kitchen METOPROLOL SUCCINATE ER 50 MG PO TB24 Oral Take 50 mg by mouth at bedtime.     Marland Kitchen RENA-VITE PO TABS Oral Take 1 tablet by mouth daily.      Marland Kitchen REPAGLINIDE 0.5 MG PO TABS Oral Take 0.5 mg by mouth 3 (three) times daily before meals.        BP 162/54  Pulse 76  Temp(Src) 97.5 F (36.4 C) (Oral)  Resp  20  Wt 130 lb 4 oz (59.081 kg)  SpO2 100%  Physical Exam  Nursing note and vitals reviewed. Constitutional: She is oriented to person, place, and time. She appears well-developed and well-nourished. No distress.  HENT:  Head: Normocephalic and atraumatic.  Eyes: EOM are normal.  Neck: Normal range of motion. Neck supple.  Cardiovascular: Normal rate and regular rhythm.   Pulmonary/Chest: Effort normal and breath sounds normal. No respiratory distress. She has no wheezes. She has no rales.  Musculoskeletal: She exhibits no edema.  Neurological: She is alert and oriented to person, place, and time.       Moves all ext  Skin: Skin is warm and dry.  Psychiatric: She has a normal mood and affect. Her behavior is normal.    ED Course  Procedures (including critical care time)  Labs Reviewed - No data to display No results found.   1. Admission for dialysis       MDM   Will call [facility] to admit for HD        Loren Racer, MD 03/27/11 1712

## 2011-03-27 NOTE — ED Notes (Signed)
Dialysis called and they are ready for patient

## 2011-03-27 NOTE — H&P (Signed)
PCP:   [name], MD, MD   Chief Complaint:  I need dialysis  HPI: Patient here for routine dialysis. No other complains.    Past Medical History: Past Medical History  Diagnosis Date  . Chronic kidney disease     esrd  . Diabetes mellitus   . Hyperlipidemia   . Hypertension   . Renal disorder   . Dialysis care     tues, thurs, sat  . Gout due to renal impairment    Past Surgical History  Procedure Date  . Carotid endarterectomy 2002    left  . Btl   . Rectal fissurectomy   . Av fistula placement 10/30/2010    right brachiocephalic   . Fistulogram     Medications: Prior to Admission medications   Medication Sig Start Date End Date Taking? Authorizing Provider  allopurinol (ZYLOPRIM) 100 MG tablet Take 100 mg by mouth daily.     Yes Historical Provider, MD  amLODipine (NORVASC) 5 MG tablet Take 5 mg by mouth at bedtime.    Yes Historical Provider, MD  calcium acetate (PHOSLO) 667 MG capsule Take 1,334 mg by mouth 3 (three) times daily with meals.    Yes Historical Provider, MD  Coenzyme Q10 150 MG CAPS Take 300 mg by mouth daily.    Yes Historical Provider, MD  colchicine 0.6 MG tablet Take 0.6 mg by mouth daily as needed. For gout.   Yes Historical Provider, MD  docusate sodium (COLACE) 100 MG capsule Take 200 mg by mouth at bedtime.     Yes Historical Provider, MD  hydrALAZINE (APRESOLINE) 25 MG tablet Take 75 mg by mouth 3 (three) times daily.    Yes Historical Provider, MD  l-methylfolate-B6-B12 (METANX) 3-35-2 MG TABS Take 1 tablet by mouth 2 (two) times daily.    Yes Historical Provider, MD  losartan (COZAAR) 50 MG tablet Take 50 mg by mouth at bedtime.    Yes Historical Provider, MD  metoprolol (TOPROL-XL) 50 MG 24 hr tablet Take 50 mg by mouth at bedtime.    Yes Historical Provider, MD  multivitamin (RENA-VIT) TABS tablet Take 1 tablet by mouth daily.     Yes Historical Provider, MD  repaglinide (PRANDIN) 0.5 MG tablet Take 0.5 mg by mouth 3 (three)  times daily before meals.     Yes Historical Provider, MD    Allergies:   Allergies  Allergen Reactions  . Codeine Other (See Comments)    Passes out  . Epinephrine Other (See Comments)    Tachycardia, diaphoresis, syncope  . Percocet (Oxycodone-Acetaminophen) Other (See Comments)    Passes  out    Social History:  reports that she has been smoking Cigarettes.  She has a 50 pack-year smoking history. She has never used smokeless tobacco. She reports that she does not drink alcohol or use illicit drugs.  History   Social History Narrative  . No narrative on file     Family History: Family History  Problem Relation Age of Onset  . COPD Mother   . Kidney disease Mother   . Heart disease Father   . Lymphoma Son     Physical Exam: Filed Vitals:   03/27/11 1438 03/27/11 1500  BP: 162/54   Pulse: 76   Temp: 97.5 F (36.4 C)   TempSrc: Oral   Resp: 20   Weight:  59.081 kg (130 lb 4 oz)  SpO2: 100%    General appearance: alert, cooperative and appears stated age Eyes: conjunctivae/corneas clear. PERRL,  EOM's intact. Fundi benign.  She does not seem to be in any acute distress Labs on Admission:   [facility] 03/25/11 1604  NA 139  K 4.5  CL 100  CO2 26  GLUCOSE 88  BUN 47*  CREATININE 7.27*  CALCIUM 10.1  MG --  PHOS 3.5    Basename 03/25/11 1604  AST --  ALT --  ALKPHOS --  BILITOT --  PROT --  ALBUMIN 3.7   No results found for this basename: LIPASE:2,AMYLASE:2 in the last 72 hours  Basename 03/25/11 1604  WBC 7.0  NEUTROABS --  HGB 13.0  HCT 40.2  MCV 93.1  PLT 208   No results found for this basename: CKTOTAL:3,CKMB:3,CKMBINDEX:3,TROPONINI:3 in the last 72 hours No results found for this basename: TSH,T4TOTAL,FREET3,T3FREE,THYROIDAB in the last 72 hours No results found for this basename: VITAMINB12:2,FOLATE:2,FERRITIN:2,TIBC:2,IRON:2,RETICCTPCT:2 in the last 72 hours  Radiological Exams on Admission: No results  found.  Assessment/Plan Present on Admission:  .End stage renal disease .HTN (hypertension) .DM (diabetes mellitus) .Anemia of chronic disease   HD per Dr.  Arthur Holms 03/27/2011, 5:16 PM

## 2011-03-27 NOTE — ED Notes (Signed)
Per Dorathy Daft RN Renal Dt ordered to come to Triage Renal 70-2-2

## 2011-03-29 NOTE — Progress Notes (Signed)
Utilization Review Completed.Alexandra [name] T1/14/2013   

## 2011-03-30 ENCOUNTER — Encounter (HOSPITAL_COMMUNITY): Payer: Self-pay | Admitting: *Deleted

## 2011-03-30 ENCOUNTER — Emergency Department (HOSPITAL_COMMUNITY): Payer: Medicare HMO

## 2011-03-30 ENCOUNTER — Inpatient Hospital Stay (HOSPITAL_COMMUNITY)
Admission: EM | Admit: 2011-03-30 | Discharge: 2011-03-30 | DRG: 682 | Disposition: A | Discharge status: Home / Self Care | Payer: Medicare HMO | Attending: Family Medicine | Admitting: Family Medicine

## 2011-03-30 DIAGNOSIS — N186 End stage renal disease: Secondary | ICD-10-CM | POA: Diagnosis present

## 2011-03-30 DIAGNOSIS — Z992 Dependence on renal dialysis: Secondary | ICD-10-CM

## 2011-03-30 DIAGNOSIS — E119 Type 2 diabetes mellitus without complications: Secondary | ICD-10-CM | POA: Diagnosis present

## 2011-03-30 DIAGNOSIS — N19 Unspecified kidney failure: Secondary | ICD-10-CM

## 2011-03-30 DIAGNOSIS — I120 Hypertensive chronic kidney disease with stage 5 chronic kidney disease or end stage renal disease: Principal | ICD-10-CM | POA: Diagnosis present

## 2011-03-30 DIAGNOSIS — E785 Hyperlipidemia, unspecified: Secondary | ICD-10-CM | POA: Diagnosis present

## 2011-03-30 LAB — DIFFERENTIAL
Basophils Absolute: 0 10*3/uL (ref 0.0–0.1)
Basophils Relative: 1 % (ref 0–1)
Eosinophils Absolute: 0.2 10*3/uL (ref 0.0–0.7)
Eosinophils Relative: 2 % (ref 0–5)
Lymphocytes Relative: 33 % (ref 12–46)
Monocytes Absolute: 0.7 10*3/uL (ref 0.1–1.0)

## 2011-03-30 LAB — CBC
MCH: 30 pg (ref 26.0–34.0)
MCHC: 32.2 g/dL (ref 30.0–36.0)
MCV: 93.4 fL (ref 78.0–100.0)
Platelets: 186 10*3/uL (ref 150–400)
RDW: 18.7 % — ABNORMAL HIGH (ref 11.5–15.5)
WBC: 7.6 10*3/uL (ref 4.0–10.5)

## 2011-03-30 LAB — RENAL FUNCTION PANEL
Albumin: 3.4 g/dL — ABNORMAL LOW (ref 3.5–5.2)
BUN: 71 mg/dL — ABNORMAL HIGH (ref 6–23)
Calcium: 9.9 mg/dL (ref 8.4–10.5)
Creatinine, Ser: 9.2 mg/dL — ABNORMAL HIGH (ref 0.50–1.10)
GFR calc non Af Amer: 4 mL/min — ABNORMAL LOW (ref >90)
Phosphorus: 2.9 mg/dL (ref 2.3–4.6)

## 2011-03-30 LAB — URIC ACID: Uric Acid, Serum: 5 mg/dL (ref 2.4–7.0)

## 2011-03-30 MED ORDER — HEPARIN SODIUM (PORCINE) 1000 UNIT/ML DIALYSIS
20.0000 [IU]/kg | INTRAMUSCULAR | Status: DC | PRN
  Administered 2011-03-30: 1200 [IU] via INTRAVENOUS_CENTRAL

## 2011-03-30 NOTE — ED Notes (Signed)
Report given to Dialysis RN. Dialysis made aware blood work was not done because Pt is a hard stick. Blood will be drawn in dialysis.

## 2011-03-30 NOTE — Discharge Summary (Signed)
  Patient was admitted for hemodialysis and is being discharged same day afterwards.  Patient to follow up with nephrologist.  No change in exam or medications.

## 2011-03-30 NOTE — ED Provider Notes (Signed)
History     CSN: [id number]  Arrival date & time 03/30/11  1436   First MD Initiated Contact with Patient 03/30/11 1450      Chief Complaint  Patient presents with  . routine dialysis     HPI Medical clearance Onset - today Duration - constant Course - stable Associated symptoms - no cp/sob/fever/cough/headache/weakness Worsened by - nothing Improved by - nothing  Pt here for dialysis She has no complaints  Past Medical History  Diagnosis Date  . Chronic kidney disease     esrd  . Diabetes mellitus   . Hyperlipidemia   . Hypertension   . Renal disorder   . Dialysis care     tues, thurs, sat  . 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 Carotid endarterectomy 2002    left  . Btl   . Rectal fissurectomy   . Av fistula placement 10/30/2010    right brachiocephalic   . Fistulogram     Family History  Problem Relation Age of Onset  . COPD Mother   . Kidney disease Mother   . Heart disease Father   . Lymphoma Son     History  Substance Use Topics  . Smoking status: Current Everyday Smoker -- 1.0 packs/day for 50 years    Types: Cigarettes  . Smokeless tobacco: Never Used  . Alcohol Use: No    OB History    Grav Para Term Preterm Abortions TAB SAB Ect Mult Living                  Review of Systems  All other systems reviewed and are negative.    Allergies  Codeine; Epinephrine; and Percocet  Home Medications   Current Outpatient Rx  Name Route Sig Dispense Refill  . ALLOPURINOL 100 MG PO TABS Oral Take 100 mg by mouth daily.      Marland Kitchen AMLODIPINE BESYLATE 5 MG PO TABS Oral Take 5 mg by mouth at bedtime.     Marland Kitchen CALCIUM ACETATE 667 MG PO CAPS Oral Take 1,334 mg by mouth 3 (three) times daily with meals.     Marland Kitchen COENZYME Q10 150 MG PO CAPS Oral Take 300 mg by mouth daily.     . COLCHICINE 0.6 MG PO TABS Oral Take 0.6 mg by mouth daily as needed. For gout.    Marland Kitchen DOCUSATE SODIUM 100 MG PO CAPS Oral Take 200 mg by mouth  at bedtime.      Marland Kitchen HYDRALAZINE HCL 25 MG PO TABS Oral Take 75 mg by mouth 3 (three) times daily.     . L-METHYLFOLATE-B6-B12 3-35-2 MG PO TABS Oral Take 1 tablet by mouth 2 (two) times daily.     Marland Kitchen LOSARTAN POTASSIUM 50 MG PO TABS Oral Take 50 mg by mouth at bedtime.     Marland Kitchen METOPROLOL SUCCINATE ER 50 MG PO TB24 Oral Take 50 mg by mouth at bedtime.     Marland Kitchen RENA-VITE PO TABS Oral Take 1 tablet by mouth daily.      Marland Kitchen REPAGLINIDE 0.5 MG PO TABS Oral Take 0.5 mg by mouth 3 (three) times daily before meals.        BP 183/63  Pulse 80  Temp(Src) 97.5 F (36.4 C) (Oral)  Resp 18  SpO2 95%  Physical Exam CONSTITUTIONAL: Well developed/well nourished HEAD AND FACE: Normocephalic/atraumatic EYES: EOMI ENMT: Mucous membranes moist NECK: supple no meningeal signs CV:  no murmurs/rubs/gallops noted  LUNGS: Lungs are clear to auscultation bilaterally, no apparent distress Chest - catheter noted to right upper chest  ABDOMEN: soft, nontender, no rebound or guarding GU:no cva tenderness NEURO: Pt is awake/alert, moves all extremitiesx4 EXTREMITIES: pulses normal, full ROM SKIN: warm, color normal PSYCH: no abnormalities of mood noted  ED Course  Procedures    Labs Reviewed  I-STAT, CHEM 8     1. Renal failure       MDM  Nursing notes reviewed and considered in documentation Previous records reviewed and considered  D/w [facility] will see patient         Joya Gaskins, MD 03/30/11 [id number]

## 2011-03-30 NOTE — H&P (Signed)
PCP:   [name], MD, MD   Chief Complaint:  I need dialysis  HPI: This is a 66 yo female here for routine dialysis. No other complains.  No changes to previous H&P.    Past Medical History: Past Medical History  Diagnosis Date  . Chronic kidney disease     esrd  . Diabetes mellitus   . Hyperlipidemia   . Hypertension   . Renal disorder   . Dialysis care     tues, thurs, sat  . Gout due to renal impairment    Past Surgical History  Procedure Date  . Carotid endarterectomy 2002    left  . Btl   . Rectal fissurectomy   . Av fistula placement 10/30/2010    right brachiocephalic   . Fistulogram     Medications: Prior to Admission medications   Medication Sig Start Date End Date Taking? Authorizing Provider  allopurinol (ZYLOPRIM) 100 MG tablet Take 100 mg by mouth daily.     Yes Historical Provider, MD  amLODipine (NORVASC) 5 MG tablet Take 5 mg by mouth at bedtime.    Yes Historical Provider, MD  calcium acetate (PHOSLO) 667 MG capsule Take 1,334 mg by mouth 3 (three) times daily with meals.    Yes Historical Provider, MD  Coenzyme Q10 150 MG CAPS Take 300 mg by mouth daily.    Yes Historical Provider, MD  colchicine 0.6 MG tablet Take 0.6 mg by mouth daily as needed. For gout.   Yes Historical Provider, MD  docusate sodium (COLACE) 100 MG capsule Take 200 mg by mouth at bedtime.     Yes Historical Provider, MD  hydrALAZINE (APRESOLINE) 25 MG tablet Take 75 mg by mouth 3 (three) times daily.    Yes Historical Provider, MD  l-methylfolate-B6-B12 (METANX) 3-35-2 MG TABS Take 1 tablet by mouth 2 (two) times daily.    Yes Historical Provider, MD  losartan (COZAAR) 50 MG tablet Take 50 mg by mouth at bedtime.    Yes Historical Provider, MD  metoprolol (TOPROL-XL) 50 MG 24 hr tablet Take 50 mg by mouth at bedtime.    Yes Historical Provider, MD  multivitamin (RENA-VIT) TABS tablet Take 1 tablet by mouth daily.     Yes Historical Provider, MD  repaglinide (PRANDIN) 0.5  MG tablet Take 0.5 mg by mouth 3 (three) times daily before meals.     Yes Historical Provider, MD    Allergies:   Allergies  Allergen Reactions  . Codeine Other (See Comments)    Passes out  . Epinephrine Other (See Comments)    Tachycardia, diaphoresis, syncope  . Percocet (Oxycodone-Acetaminophen) Other (See Comments)    Passes  out    Social History:  reports that she has been smoking Cigarettes.  She has a 50 pack-year smoking history. She has never used smokeless tobacco. She reports that she does not drink alcohol or use illicit drugs.  History   Social History Narrative  . No narrative on file     Family History: Family History  Problem Relation Age of Onset  . COPD Mother   . Kidney disease Mother   . Heart disease Father   . Lymphoma Son     Physical Exam: Filed Vitals:   03/30/11 1441 03/30/11 1623 03/30/11 1642 03/30/11 1700  BP: 183/63 201/94 195/85 176/65  Pulse:   73 76  Temp:  97.5 F (36.4 C)    TempSrc:  Oral    Resp:  20    Weight:  61.2 kg (134 lb 14.7 oz)    SpO2:  98%     General appearance: alert, cooperative and appears stated age Eyes: conjunctivae/corneas clear. PERRL, EOM's intact. Fundi benign. Resp: clear to auscultation bilaterally Cardio: regular rate and rhythm, S1, S2 normal, no murmur, click, rub or gallop Extremities: extremities normal, atraumatic, no cyanosis or edema  She does not seem to be in any acute distress Labs on Admission:   [facility] 03/27/11 1738  NA 137  K 4.0  CL 96  CO2 26  GLUCOSE 186*  BUN 52*  CREATININE 7.17*  CALCIUM 9.8  MG --  PHOS 2.7    Basename 03/27/11 1738  AST --  ALT --  ALKPHOS --  BILITOT --  PROT --  ALBUMIN 3.5   No results found for this basename: LIPASE:2,AMYLASE:2 in the last 72 hours  Basename 03/30/11 1612 03/27/11 1738  WBC 7.6 7.1  NEUTROABS 4.2 --  HGB 12.7 12.6  HCT 39.5 39.3  MCV 93.4 94.2  PLT 186 195   No results found for this basename:  CKTOTAL:3,CKMB:3,CKMBINDEX:3,TROPONINI:3 in the last 72 hours No results found for this basename: TSH,T4TOTAL,FREET3,T3FREE,THYROIDAB in the last 72 hours No results found for this basename: VITAMINB12:2,FOLATE:2,FERRITIN:2,TIBC:2,IRON:2,RETICCTPCT:2 in the last 72 hours  Radiological Exams on Admission: No results found.  Assessment/Plan 1.  ESRD  HD per Dr.  Orie Rout, Nusayba Cadenas JEHIEL 03/30/2011, 5:23 PM

## 2011-03-30 NOTE — ED Notes (Signed)
Pt is here for routine dialysis and last HD was on saturday

## 2011-03-30 NOTE — Consults (Signed)
Pt. In with chf. Will write dialysis orders.

## 2011-04-01 ENCOUNTER — Inpatient Hospital Stay (HOSPITAL_COMMUNITY): Payer: Medicare HMO

## 2011-04-01 ENCOUNTER — Observation Stay (HOSPITAL_COMMUNITY)
Admission: EM | Admit: 2011-04-01 | Discharge: 2011-04-01 | Disposition: A | Discharge status: Home / Self Care | Payer: Medicare HMO | Attending: Internal Medicine | Admitting: Internal Medicine

## 2011-04-01 ENCOUNTER — Emergency Department (HOSPITAL_COMMUNITY): Admit: 2011-04-01 | Discharge: 2011-04-01 | Disposition: A | Discharge status: Home / Self Care | Payer: Medicare HMO

## 2011-04-01 ENCOUNTER — Encounter (HOSPITAL_COMMUNITY): Payer: Self-pay | Admitting: *Deleted

## 2011-04-01 DIAGNOSIS — E119 Type 2 diabetes mellitus without complications: Secondary | ICD-10-CM | POA: Insufficient documentation

## 2011-04-01 DIAGNOSIS — M109 Gout, unspecified: Secondary | ICD-10-CM | POA: Insufficient documentation

## 2011-04-01 DIAGNOSIS — I120 Hypertensive chronic kidney disease with stage 5 chronic kidney disease or end stage renal disease: Secondary | ICD-10-CM | POA: Insufficient documentation

## 2011-04-01 DIAGNOSIS — N186 End stage renal disease: Secondary | ICD-10-CM | POA: Insufficient documentation

## 2011-04-01 DIAGNOSIS — Z992 Dependence on renal dialysis: Principal | ICD-10-CM | POA: Insufficient documentation

## 2011-04-01 DIAGNOSIS — F17200 Nicotine dependence, unspecified, uncomplicated: Secondary | ICD-10-CM | POA: Insufficient documentation

## 2011-04-01 DIAGNOSIS — E785 Hyperlipidemia, unspecified: Secondary | ICD-10-CM | POA: Insufficient documentation

## 2011-04-01 LAB — DIFFERENTIAL
Basophils Relative: 1 % (ref 0–1)
Eosinophils Relative: 3 % (ref 0–5)
Lymphocytes Relative: 34 % (ref 12–46)
Monocytes Absolute: 0.7 10*3/uL (ref 0.1–1.0)
Monocytes Relative: 10 % (ref 3–12)
Neutro Abs: 3.4 10*3/uL (ref 1.7–7.7)

## 2011-04-01 LAB — RENAL FUNCTION PANEL
BUN: 47 mg/dL — ABNORMAL HIGH (ref 6–23)
CO2: 26 mEq/L (ref 19–32)
Calcium: 9.6 mg/dL (ref 8.4–10.5)
Chloride: 99 mEq/L (ref 96–112)
Creatinine, Ser: 7.61 mg/dL — ABNORMAL HIGH (ref 0.50–1.10)

## 2011-04-01 LAB — CBC
HCT: 39.5 % (ref 36.0–46.0)
Hemoglobin: 12.4 g/dL (ref 12.0–15.0)
MCHC: 31.4 g/dL (ref 30.0–36.0)
MCV: 95.4 fL (ref 78.0–100.0)

## 2011-04-01 MED ORDER — CALCIUM ACETATE 667 MG PO CAPS: 1334.0000 mg | ORAL_CAPSULE | Freq: Three times a day (TID) | ORAL | Status: DC

## 2011-04-01 MED ORDER — ACETAMINOPHEN 650 MG RE SUPP: 650.0000 mg | Freq: Four times a day (QID) | RECTAL | Status: DC | PRN

## 2011-04-01 MED ORDER — RENA-VITE PO TABS
1.0000 | ORAL_TABLET | Freq: Every day | ORAL | Status: DC
Start: 2011-04-01 — End: 2011-04-02
  Filled 2011-04-01: qty 1

## 2011-04-01 MED ORDER — ACETAMINOPHEN 325 MG PO TABS
650.0000 mg | ORAL_TABLET | Freq: Four times a day (QID) | ORAL | Status: DC | PRN
Start: 2011-04-01 — End: 2011-04-02

## 2011-04-01 MED ORDER — SODIUM CHLORIDE 0.9 % IV SOLN: 250.0000 mL | INTRAVENOUS | Status: DC | PRN

## 2011-04-01 MED ORDER — AMLODIPINE BESYLATE 5 MG PO TABS
5.0000 mg | ORAL_TABLET | Freq: Every day | ORAL | Status: DC
  Filled 2011-04-01: qty 1

## 2011-04-01 MED ORDER — DOCUSATE SODIUM 100 MG PO CAPS: 200.0000 mg | ORAL_CAPSULE | Freq: Every day | ORAL | Status: DC

## 2011-04-01 MED ORDER — METOPROLOL SUCCINATE ER 50 MG PO TB24
50.0000 mg | ORAL_TABLET | Freq: Every day | ORAL | Status: DC
  Filled 2011-04-01: qty 1

## 2011-04-01 MED ORDER — SODIUM CHLORIDE 0.9 % IJ SOLN: 3.0000 mL | INTRAMUSCULAR | Status: DC | PRN

## 2011-04-01 MED ORDER — ALLOPURINOL 100 MG PO TABS
100.0000 mg | ORAL_TABLET | Freq: Every day | ORAL | Status: DC
  Filled 2011-04-01: qty 1

## 2011-04-01 MED ORDER — LOSARTAN POTASSIUM 50 MG PO TABS
50.0000 mg | ORAL_TABLET | Freq: Every day | ORAL | Status: DC
  Filled 2011-04-01: qty 1

## 2011-04-01 MED ORDER — SODIUM CHLORIDE 0.9 % IJ SOLN: 3.0000 mL | Freq: Two times a day (BID) | INTRAMUSCULAR | Status: DC

## 2011-04-01 MED ORDER — HYDRALAZINE HCL 50 MG PO TABS
75.0000 mg | ORAL_TABLET | Freq: Three times a day (TID) | ORAL | Status: DC
  Filled 2011-04-01: qty 1

## 2011-04-01 MED ORDER — L-METHYLFOLATE-B6-B12 3-35-2 MG PO TABS
1.0000 | ORAL_TABLET | Freq: Two times a day (BID) | ORAL | Status: DC
  Filled 2011-04-01: qty 1

## 2011-04-01 MED ORDER — REPAGLINIDE 0.5 MG PO TABS
0.5000 mg | ORAL_TABLET | Freq: Three times a day (TID) | ORAL | Status: DC
  Filled 2011-04-01: qty 1

## 2011-04-01 MED ORDER — HEPARIN SODIUM (PORCINE) 1000 UNIT/ML DIALYSIS: 20.0000 [IU]/kg | INTRAMUSCULAR | Status: DC | PRN

## 2011-04-01 MED ORDER — COLCHICINE 0.6 MG PO TABS
0.6000 mg | ORAL_TABLET | Freq: Every day | ORAL | Status: DC | PRN
  Filled 2011-04-01: qty 1

## 2011-04-01 NOTE — Consult Note (Signed)
Pt. In today some extra fluid  Pt needs dialysis .  Pt. Has some CHF .dialysis for today.

## 2011-04-01 NOTE — H&P (Signed)
PCP:   [name], MD, MD   Chief Complaint:  Patient needs hemodialysis treatment  HPI: 66 year old female with a past medical history significant for diabetes, hyperlipidemia, hypertension, diet, and end-stage renal disease on hemodialysis Tuesday-Thursday-Saturday; who presented to the hospital for her scheduled hemodialysis with Dr. Bascom Levels. At this point patient denies any chest pain, any shortness of breath, any nausea/vomiting, any abdominal pain or any other acute complaints.  At the moment that I saw the patient she was ready in the hemodialysis unit, comfortable and in NAD; receiving her hemodialysis treatment.  Allergies:   Allergies  Allergen Reactions  . Codeine Other (See Comments)    Passes out  . Epinephrine Other (See Comments)    Tachycardia, diaphoresis, syncope  . Percocet (Oxycodone-Acetaminophen) Other (See Comments)    Passes  out      Past Medical History  Diagnosis Date  . Chronic kidney disease     esrd  . Diabetes mellitus   . Hyperlipidemia   . Hypertension   . Renal disorder   . Dialysis care     tues, thurs, sat  . 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 Carotid endarterectomy 2002    left  . Btl   . Rectal fissurectomy   . Av fistula placement 10/30/2010    right brachiocephalic   . Fistulogram     Prior to Admission medications   Medication Sig Start Date End Date Taking? Authorizing Provider  allopurinol (ZYLOPRIM) 100 MG tablet Take 100 mg by mouth daily.     Yes Historical Provider, MD  amLODipine (NORVASC) 5 MG tablet Take 5 mg by mouth at bedtime.    Yes Historical Provider, MD  calcium acetate (PHOSLO) 667 MG capsule Take 1,334 mg by mouth 3 (three) times daily with meals.    Yes Historical Provider, MD  Coenzyme Q10 150 MG CAPS Take 300 mg by mouth daily.    Yes Historical Provider, MD  colchicine 0.6 MG tablet Take 0.6 mg by mouth daily as needed. For gout.   Yes Historical Provider,  MD  docusate sodium (COLACE) 100 MG capsule Take 200 mg by mouth at bedtime.     Yes Historical Provider, MD  hydrALAZINE (APRESOLINE) 25 MG tablet Take 75 mg by mouth 3 (three) times daily.    Yes Historical Provider, MD  l-methylfolate-B6-B12 (METANX) 3-35-2 MG TABS Take 1 tablet by mouth 2 (two) times daily.    Yes Historical Provider, MD  losartan (COZAAR) 50 MG tablet Take 50 mg by mouth at bedtime.    Yes Historical Provider, MD  metoprolol (TOPROL-XL) 50 MG 24 hr tablet Take 50 mg by mouth at bedtime.    Yes Historical Provider, MD  multivitamin (RENA-VIT) TABS tablet Take 1 tablet by mouth daily.     Yes Historical Provider, MD  repaglinide (PRANDIN) 0.5 MG tablet Take 0.5 mg by mouth 3 (three) times daily before meals.     Yes Historical Provider, MD    Social History:  reports that she has been smoking Cigarettes.  She has a 50 pack-year smoking history. She has never used smokeless tobacco. She reports that she does not drink alcohol or use illicit drugs.  Family History  Problem Relation Age of Onset  . COPD Mother   . Kidney disease Mother   . Heart disease Father   . Lymphoma Son     Review of Systems:  Negative except as otherwise specified on  patient history of present illness.   Physical Exam: Blood pressure 149/99, pulse 79, temperature 98 F (36.7 C), temperature source Oral, resp. rate 16, weight 60.95 kg (134 lb 5.9 oz), SpO2 93.00%. Constitutional: She is oriented to person, place, and time. She appears well-developed and well-nourished. Non-toxic appearance. She does not have a sickly appearance.  HENT:  Head: Normocephalic and atraumatic.  Eyes: Conjunctivae, EOM and lids are normal. Pupils are equal, round, and reactive to light. No scleral icterus.  Neck: Trachea normal and normal range of motion. Neck supple.  Cardiovascular: Normal rate.  Pulmonary/Chest: Effort normal.  Abdominal: Soft. Normal appearance. There is no CVA tenderness.  Musculoskeletal:  Normal range of motion.  Neurological: She is alert and oriented to person, place, and time. She has normal strength.  Skin: Skin is warm, dry and intact. No rash noted.  Psychiatric: She has a normal mood and affect. Her behavior is normal. Judgment and thought content normal.    Labs on Admission:  Lab work are pending at the moment of this dictation.  Radiological Exams on Admission: No results found.   Assessment/Plan 1-ESRD: Came into the hospital for a scheduled hemodialysis treatment. At this point patient is no fluid overload oh having any other acute complaints. Hemodialysis treatment is going to be provided by Dr. Bascom Levels who is the patient's nephrologist.   The rest of the patient's medical problems appears to be a stable and the plan is to continue with her current medication regimen at this point.  Time Spent on Admission: 40 minutes  [place] [facility] [phone]  04/01/2011, 5:29 PM

## 2011-04-01 NOTE — Progress Notes (Signed)
Patient ID: Alexandra [name], female   DOB: 20-Jan-1946, 66 y.o.   MRN: [id number]

## 2011-04-01 NOTE — ED Provider Notes (Addendum)
History     CSN: [id number]  Arrival date & time 04/01/11  1430   First MD Initiated Contact with Patient 04/01/11 1510      Chief Complaint  Patient presents with  . routine dialysis     (Consider location/radiation/quality/duration/timing/severity/associated sxs/prior treatment) HPI Comments: Patient is here for chief complaint of needs dialysis.  Patient has no other acute issues today but as scheduled Tuesday Thursday and Saturday with Dr. Bascom Levels for dialysis.  Dr. Bascom Levels requires that his dialysis patients be admitted to the [facility] for this.  Patient otherwise has no chest pain, shortness of breath, nausea, vomiting, abdominal pain or other issues.  The history is provided by the patient.    Past Medical History  Diagnosis Date  . Chronic kidney disease     esrd  . Diabetes mellitus   . Hyperlipidemia   . Hypertension   . Renal disorder   . Dialysis care     tues, thurs, sat  . 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 Carotid endarterectomy 2002    left  . Btl   . Rectal fissurectomy   . Av fistula placement 10/30/2010    right brachiocephalic   . Fistulogram     Family History  Problem Relation Age of Onset  . COPD Mother   . Kidney disease Mother   . Heart disease Father   . Lymphoma Son     History  Substance Use Topics  . Smoking status: Current Everyday Smoker -- 1.0 packs/day for 50 years    Types: Cigarettes  . Smokeless tobacco: Never Used  . Alcohol Use: No    OB History    Grav Para Term Preterm Abortions TAB SAB Ect Mult Living                  Review of Systems  Constitutional: Negative.  Negative for fever and chills.  HENT: Negative.   Eyes: Negative.  Negative for discharge and redness.  Respiratory: Negative.  Negative for cough and shortness of breath.   Cardiovascular: Negative.  Negative for chest pain.  Gastrointestinal: Negative.  Negative for nausea, vomiting, abdominal pain  and diarrhea.  Genitourinary: Negative.  Negative for dysuria and vaginal discharge.  Musculoskeletal: Negative.  Negative for back pain.  Skin: Negative.  Negative for color change and rash.  Neurological: Negative.  Negative for syncope and headaches.  Hematological: Negative.  Negative for adenopathy.  Psychiatric/Behavioral: Negative.  Negative for confusion.  All other systems reviewed and are negative.    Allergies  Codeine; Epinephrine; and Percocet  Home Medications   Current Outpatient Rx  Name Route Sig Dispense Refill  . ALLOPURINOL 100 MG PO TABS Oral Take 100 mg by mouth daily.      Marland Kitchen AMLODIPINE BESYLATE 5 MG PO TABS Oral Take 5 mg by mouth at bedtime.     Marland Kitchen CALCIUM ACETATE 667 MG PO CAPS Oral Take 1,334 mg by mouth 3 (three) times daily with meals.     Marland Kitchen COENZYME Q10 150 MG PO CAPS Oral Take 300 mg by mouth daily.     . COLCHICINE 0.6 MG PO TABS Oral Take 0.6 mg by mouth daily as needed. For gout.    Marland Kitchen DOCUSATE SODIUM 100 MG PO CAPS Oral Take 200 mg by mouth at bedtime.      Marland Kitchen HYDRALAZINE HCL 25 MG PO TABS Oral Take 75 mg by mouth 3 (three) times daily.     Marland Kitchen  L-METHYLFOLATE-B6-B12 3-35-2 MG PO TABS Oral Take 1 tablet by mouth 2 (two) times daily.     Marland Kitchen LOSARTAN POTASSIUM 50 MG PO TABS Oral Take 50 mg by mouth at bedtime.     Marland Kitchen METOPROLOL SUCCINATE ER 50 MG PO TB24 Oral Take 50 mg by mouth at bedtime.     Marland Kitchen RENA-VITE PO TABS Oral Take 1 tablet by mouth daily.      Marland Kitchen REPAGLINIDE 0.5 MG PO TABS Oral Take 0.5 mg by mouth 3 (three) times daily before meals.        BP 179/61  Pulse 76  Temp(Src) 98.3 F (36.8 C) (Oral)  Resp 20  SpO2 95%  Physical Exam  Nursing note and vitals reviewed. Constitutional: She is oriented to person, place, and time. She appears well-developed and well-nourished.  Non-toxic appearance. She does not have a sickly appearance.  HENT:  Head: Normocephalic and atraumatic.  Eyes: Conjunctivae, EOM and lids are normal. Pupils are equal, round,  and reactive to light. No scleral icterus.  Neck: Trachea normal and normal range of motion. Neck supple.  Cardiovascular: Normal rate.   Pulmonary/Chest: Effort normal.  Abdominal: Soft. Normal appearance. There is no CVA tenderness.  Musculoskeletal: Normal range of motion.  Neurological: She is alert and oriented to person, place, and time. She has normal strength.  Skin: Skin is warm, dry and intact. No rash noted.  Psychiatric: She has a normal mood and affect. Her behavior is normal. Judgment and thought content normal.    ED Course  Procedures (including critical care time)   Labs Reviewed  I-STAT, CHEM 8     MDM  Patient here for her scheduled dialysis and has no other acute issues.  Dr. Bascom Levels has scheduled her dialysis and I will contact [facility] for admission.        Nat Christen, MD 04/01/11 1521  Discussed pt with [facility] for evaluation.    Nat Christen, MD 04/01/11 [phone]

## 2011-04-01 NOTE — ED Notes (Signed)
Patient comes to ED for her dialysis treatment.

## 2011-04-01 NOTE — ED Notes (Signed)
Pt is here for routine dialysis and no complaints.  Diet ordered

## 2011-04-02 ENCOUNTER — Inpatient Hospital Stay (HOSPITAL_COMMUNITY): Payer: Medicare HMO

## 2011-04-02 ENCOUNTER — Ambulatory Visit (HOSPITAL_COMMUNITY): Payer: Medicare HMO

## 2011-04-02 NOTE — Progress Notes (Signed)
Utilization Review Completed.  Alexandra [name]  04/02/2011   

## 2011-04-03 ENCOUNTER — Encounter (HOSPITAL_COMMUNITY): Payer: Self-pay

## 2011-04-03 ENCOUNTER — Observation Stay (HOSPITAL_COMMUNITY)
Admission: EM | Admit: 2011-04-03 | Discharge: 2011-04-03 | Disposition: A | Discharge status: Home / Self Care | Payer: Medicare HMO | Attending: Internal Medicine | Admitting: Internal Medicine

## 2011-04-03 ENCOUNTER — Emergency Department (HOSPITAL_COMMUNITY): Payer: Medicare HMO

## 2011-04-03 DIAGNOSIS — E119 Type 2 diabetes mellitus without complications: Secondary | ICD-10-CM | POA: Insufficient documentation

## 2011-04-03 DIAGNOSIS — N189 Chronic kidney disease, unspecified: Secondary | ICD-10-CM | POA: Insufficient documentation

## 2011-04-03 DIAGNOSIS — N19 Unspecified kidney failure: Secondary | ICD-10-CM

## 2011-04-03 DIAGNOSIS — I129 Hypertensive chronic kidney disease with stage 1 through stage 4 chronic kidney disease, or unspecified chronic kidney disease: Principal | ICD-10-CM | POA: Insufficient documentation

## 2011-04-03 DIAGNOSIS — Z992 Dependence on renal dialysis: Secondary | ICD-10-CM | POA: Insufficient documentation

## 2011-04-03 DIAGNOSIS — E785 Hyperlipidemia, unspecified: Secondary | ICD-10-CM | POA: Insufficient documentation

## 2011-04-03 LAB — DIFFERENTIAL
Basophils Absolute: 0 10*3/uL (ref 0.0–0.1)
Eosinophils Relative: 3 % (ref 0–5)
Lymphocytes Relative: 37 % (ref 12–46)
Lymphs Abs: 2 10*3/uL (ref 0.7–4.0)
Monocytes Absolute: 0.7 10*3/uL (ref 0.1–1.0)
Neutro Abs: 2.5 10*3/uL (ref 1.7–7.7)

## 2011-04-03 LAB — CBC
HCT: 41.2 % (ref 36.0–46.0)
Hemoglobin: 13.1 g/dL (ref 12.0–15.0)
MCV: 93.8 fL (ref 78.0–100.0)
RBC: 4.39 MIL/uL (ref 3.87–5.11)
RDW: 18.4 % — ABNORMAL HIGH (ref 11.5–15.5)
WBC: 5.3 10*3/uL (ref 4.0–10.5)

## 2011-04-03 MED ORDER — NEPRO/CARBSTEADY PO LIQD: 237.0000 mL | ORAL | Status: DC | PRN

## 2011-04-03 MED ORDER — HEPARIN SODIUM (PORCINE) 1000 UNIT/ML DIALYSIS
20.0000 [IU]/kg | INTRAMUSCULAR | Status: DC | PRN
  Administered 2011-04-03: 1200 [IU] via INTRAVENOUS_CENTRAL

## 2011-04-03 MED ORDER — PENTAFLUOROPROP-TETRAFLUOROETH EX AERO: 1.0000 "application " | INHALATION_SPRAY | CUTANEOUS | Status: DC | PRN

## 2011-04-03 MED ORDER — HEPARIN SODIUM (PORCINE) 1000 UNIT/ML DIALYSIS: 1000.0000 [IU] | INTRAMUSCULAR | Status: DC | PRN

## 2011-04-03 MED ORDER — LIDOCAINE HCL (PF) 1 % IJ SOLN: 5.0000 mL | INTRAMUSCULAR | Status: DC | PRN

## 2011-04-03 MED ORDER — LIDOCAINE-PRILOCAINE 2.5-2.5 % EX CREA: 1.0000 "application " | TOPICAL_CREAM | CUTANEOUS | Status: DC | PRN

## 2011-04-03 MED ORDER — ALTEPLASE 2 MG IJ SOLR: 2.0000 mg | Freq: Once | INTRAMUSCULAR | Status: DC | PRN

## 2011-04-03 MED ORDER — SODIUM CHLORIDE 0.9 % IV SOLN: 100.0000 mL | INTRAVENOUS | Status: DC | PRN

## 2011-04-03 NOTE — ED Provider Notes (Signed)
History     CSN: [id number]  Arrival date & time 04/03/11  1423   First MD Initiated Contact with Patient 04/03/11 1522      Chief Complaint  Patient presents with  . Chronic Renal Failure    (Consider location/radiation/quality/duration/timing/severity/associated sxs/prior treatment) HPI Patient is a 66 yo F with history of ESRD on HD who normally receives this on Tuesday, Thursday, Saturday schedule.  Patient presents today with no complaints other than needing dialysis.  She is hemodynamically stable at this time. Past Medical History  Diagnosis Date  . Chronic kidney disease     esrd  . Diabetes mellitus   . Hyperlipidemia   . Hypertension   . Renal disorder   . Dialysis care     tues, thurs, sat  . 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 Carotid endarterectomy 2002    left  . Btl   . Rectal fissurectomy   . Av fistula placement 10/30/2010    right brachiocephalic   . Fistulogram     Family History  Problem Relation Age of Onset  . COPD Mother   . Kidney disease Mother   . Heart disease Father   . Lymphoma Son     History  Substance Use Topics  . Smoking status: Current Everyday Smoker -- 1.0 packs/day for 50 years    Types: Cigarettes  . Smokeless tobacco: Never Used  . Alcohol Use: No    OB History    Grav Para Term Preterm Abortions TAB SAB Ect Mult Living                  Review of Systems  All other systems reviewed and are negative.    Allergies  Codeine; Epinephrine; and Percocet  Home Medications   Current Outpatient Rx  Name Route Sig Dispense Refill  . ALLOPURINOL 100 MG PO TABS Oral Take 100 mg by mouth daily.      Marland Kitchen AMLODIPINE BESYLATE 5 MG PO TABS Oral Take 5 mg by mouth at bedtime.     Marland Kitchen CALCIUM ACETATE 667 MG PO CAPS Oral Take 1,334 mg by mouth 3 (three) times daily with meals.     Marland Kitchen COENZYME Q10 150 MG PO CAPS Oral Take 300 mg by mouth daily.     . COLCHICINE 0.6 MG PO TABS Oral Take  0.6 mg by mouth daily as needed. For gout.    Marland Kitchen DOCUSATE SODIUM 100 MG PO CAPS Oral Take 200 mg by mouth at bedtime.      Marland Kitchen HYDRALAZINE HCL 25 MG PO TABS Oral Take 75 mg by mouth 3 (three) times daily.     . L-METHYLFOLATE-B6-B12 3-35-2 MG PO TABS Oral Take 1 tablet by mouth 2 (two) times daily.     Marland Kitchen LOSARTAN POTASSIUM 50 MG PO TABS Oral Take 50 mg by mouth at bedtime.     Marland Kitchen METOPROLOL SUCCINATE ER 50 MG PO TB24 Oral Take 50 mg by mouth at bedtime.     Marland Kitchen RENA-VITE PO TABS Oral Take 1 tablet by mouth daily.      Marland Kitchen REPAGLINIDE 0.5 MG PO TABS Oral Take 0.5 mg by mouth 3 (three) times daily before meals.        BP 159/56  Pulse 81  Temp(Src) 98 F (36.7 C) (Oral)  Resp 18  SpO2 96%  Physical Exam  Nursing note and vitals reviewed. Constitutional: She is oriented to person, place, and  time. She appears well-developed and well-nourished. No distress.  HENT:  Head: Normocephalic and atraumatic.  Eyes: Conjunctivae and EOM are normal. Pupils are equal, round, and reactive to light.  Neck: Normal range of motion.  Cardiovascular: Normal rate, regular rhythm, normal heart sounds and intact distal pulses.  Exam reveals no gallop and no friction rub.   No murmur heard. Pulmonary/Chest: Effort normal and breath sounds normal.  Abdominal: Soft. Bowel sounds are normal. She exhibits no distension. There is no tenderness. There is no rebound and no guarding.  Musculoskeletal: Normal range of motion.  Neurological: She is alert and oriented to person, place, and time. No cranial nerve deficit.  Skin: Skin is warm and dry. No rash noted.  Psychiatric: She has a normal mood and affect.    ED Course  Procedures (including critical care time)   Labs Reviewed  CBC  DIFFERENTIAL  I-STAT, CHEM 8   No results found.   1. Renal failure       MDM  Patient was seen by myself and nephrologist, Dr. Bascom Levels, in ED.  Dialysis notified.  [facility], Dr. Sunnie Nielsen to admit for further  management.        Cyndra Numbers, MD 04/03/11 [phone]

## 2011-04-03 NOTE — ED Notes (Signed)
Pt here requesting dialysis, sts she has already spoke with dr frazier, no needs no complaints

## 2011-04-03 NOTE — Consults (Signed)
Pt. Is in the er. She is in need of dialysis.  See orders.

## 2011-04-03 NOTE — H&P (Signed)
Hospital Admission Note Date: 04/03/2011  PCP: [name], MD, MD  Chief Complaint: Present for Dialysis.   History of Present Illness: 66 year old with PMH significant for  ESRD, DM, Hypertension on dialysis, T, Thu, Sat presents for dialysis. Patient denies Nausea, vomiting, chest pain or dyspnea. No complaints.    Allergies: Codeine; Epinephrine; and Percocet Past Medical History  Diagnosis Date  . Chronic kidney disease     esrd  . Diabetes mellitus   . Hyperlipidemia   . Hypertension   . Renal disorder   . Dialysis care     tues, thurs, sat  . Gout due to renal impairment    Prior to Admission medications   Medication Sig Start Date End Date Taking? Authorizing Provider  allopurinol (ZYLOPRIM) 100 MG tablet Take 100 mg by mouth daily.     Yes Historical Provider, MD  amLODipine (NORVASC) 5 MG tablet Take 5 mg by mouth at bedtime.    Yes Historical Provider, MD  calcium acetate (PHOSLO) 667 MG capsule Take 1,334 mg by mouth 3 (three) times daily with meals.    Yes Historical Provider, MD  Coenzyme Q10 150 MG CAPS Take 300 mg by mouth daily.    Yes Historical Provider, MD  colchicine 0.6 MG tablet Take 0.6 mg by mouth daily as needed. For gout.   Yes Historical Provider, MD  docusate sodium (COLACE) 100 MG capsule Take 200 mg by mouth at bedtime.     Yes Historical Provider, MD  hydrALAZINE (APRESOLINE) 25 MG tablet Take 75 mg by mouth 3 (three) times daily.    Yes Historical Provider, MD  l-methylfolate-B6-B12 (METANX) 3-35-2 MG TABS Take 1 tablet by mouth 2 (two) times daily.    Yes Historical Provider, MD  losartan (COZAAR) 50 MG tablet Take 50 mg by mouth at bedtime.    Yes Historical Provider, MD  metoprolol (TOPROL-XL) 50 MG 24 hr tablet Take 50 mg by mouth at bedtime.    Yes Historical Provider, MD  multivitamin (RENA-VIT) TABS tablet Take 1 tablet by mouth daily.     Yes Historical Provider, MD  repaglinide (PRANDIN) 0.5 MG tablet Take 0.5 mg by mouth 3 (three)  times daily before meals.     Yes Historical Provider, MD   Past Surgical History  Procedure Date  . Carotid endarterectomy 2002    left  . Btl   . Rectal fissurectomy   . Av fistula placement 10/30/2010    right brachiocephalic   . Fistulogram    Family History  Problem Relation Age of Onset  . COPD Mother   . Kidney disease Mother   . Heart disease Father   . Lymphoma Son    History   Social History  . Marital Status: Single    Spouse Name: N/A    Number of Children: N/A  . Years of Education: N/A   Occupational History  . Nurse.    Social History Main Topics  . Smoking status: Current Everyday Smoker -- 1.0 packs/day for 50 years    Types: Cigarettes  . Smokeless tobacco: Never Used  . Alcohol Use: No  . Drug Use: No  . Sexually Active: Not on file     Review of Systems: Pertinent items are noted in HPI. Physical Exam: Filed Vitals:   04/03/11 1431  BP: 159/56  Pulse: 81  Temp: 98 F (36.7 C)  TempSrc: Oral  Resp: 18  SpO2: 96%   No intake or output data in the 24  hours ending 04/03/11 1713 BP 159/56  Pulse 81  Temp(Src) 98 F (36.7 C) (Oral)  Resp 18  SpO2 96%  General Appearance:    Alert, cooperative, no distress, appears stated age  Head:    Normocephalic, without obvious abnormality, atraumatic  Eyes:    PERRL, conjunctiva/corneas clear, EOM's intact,         Throat:   Lips, mucosa, and tongue normal; teeth and gums normal  Neck:   Supple, symmetrical, trachea midline, no adenopathy;    thyroid:  no enlargement/tenderness/nodules; no carotid   bruit or JVD     Lungs:     Clear to auscultation bilaterally, respirations unlabored  Chest Wall:    No tenderness or deformity   Heart:    Regular rate and rhythm, S1 and S2 normal, no murmur, rub   or gallop     Abdomen:     Soft, non-tender, bowel sounds active all four quadrants,    no masses, no organomegaly        Extremities:   Extremities normal, atraumatic, no cyanosis or edema      Skin:   Skin color, texture, turgor normal, no rashes or lesions     Neurologic:   CNII-XII intact, normal strength.   Lab results:  [facility] 04/01/11 1521  NA 140  K 4.3  CL 99  CO2 26  GLUCOSE 108*  BUN 47*  CREATININE 7.61*  CALCIUM 9.6  MG --  PHOS 3.0    Basename 04/01/11 1521  AST --  ALT --  ALKPHOS --  BILITOT --  PROT --  ALBUMIN 3.3*    Basename 04/01/11 1521  WBC 6.5  NEUTROABS 3.4  HGB 12.4  HCT 39.5  MCV 95.4  PLT 153     Patient Active Hospital Problem List:  End stage renal disease (12/14/2010) Patient admitted for dialysis.  Nephrology consulted.   HTN (hypertension) (02/23/2011)  Continue with currents medications.   DM (diabetes mellitus) (02/23/2011)  Continue with currents medications.    Richanda Darin M.D. [facility] [phone] 04/03/2011, 5:13 PM

## 2011-04-03 NOTE — ED Notes (Signed)
Pt requests labs to be drawn with dialysis

## 2011-04-03 NOTE — ED Notes (Signed)
Ordered diabetic renal lunch tray

## 2011-04-05 NOTE — Discharge Summary (Signed)
Physician Discharge Summary  Patient ID: Alexandra [name] MRN: [id number] DOB/AGE: 68-16-1947 68 y.o.  Admit date: 04/01/2011 Discharge date: 04/01/11  Primary Care Physician:  [name], MD, MD   Discharge Diagnoses:   1-ESRD (on hemodialysis Tuesday-Thursday-Saturday) 2-diabetes mellitus 3-hyperlipidemia 4-hypertension 5-hyperparathyroidism 6-history of gout 7-tobacco abuse    Discharge Medication List as of 04/01/2011  9:21 PM    CONTINUE these medications which have NOT CHANGED   Details  allopurinol (ZYLOPRIM) 100 MG tablet Take 100 mg by mouth daily.  , Until Discontinued, Historical Med    amLODipine (NORVASC) 5 MG tablet Take 5 mg by mouth at bedtime. , Until Discontinued, Historical Med    calcium acetate (PHOSLO) 667 MG capsule Take 1,334 mg by mouth 3 (three) times daily with meals. , Until Discontinued, Historical Med    Coenzyme Q10 150 MG CAPS Take 300 mg by mouth daily. , Until Discontinued, Historical Med    colchicine 0.6 MG tablet Take 0.6 mg by mouth daily as needed. For gout., Until Discontinued, Historical Med    docusate sodium (COLACE) 100 MG capsule Take 200 mg by mouth at bedtime.  , Until Discontinued, Historical Med    hydrALAZINE (APRESOLINE) 25 MG tablet Take 75 mg by mouth 3 (three) times daily. , Until Discontinued, Historical Med    l-methylfolate-B6-B12 (METANX) 3-35-2 MG TABS Take 1 tablet by mouth 2 (two) times daily. , Until Discontinued, Historical Med    losartan (COZAAR) 50 MG tablet Take 50 mg by mouth at bedtime. , Until Discontinued, Historical Med    metoprolol (TOPROL-XL) 50 MG 24 hr tablet Take 50 mg by mouth at bedtime. , Until Discontinued, Historical Med    multivitamin (RENA-VIT) TABS tablet Take 1 tablet by mouth daily.  , Until Discontinued, Historical Med    repaglinide (PRANDIN) 0.5 MG tablet Take 0.5 mg by mouth 3 (three) times daily before meals.  , Until Discontinued, Historical Med          Disposition and Follow-up: Patient left after hemodialysis. No changes or arrangements for followup were able to be made. She reports that she will continue following with her nephrologist and PCP as previously scheduled.  Consults:   Dr. Bascom Levels (nephrologist)   Significant Diagnostic Studies:  No results found.  Brief H and P: 66 year old female with a past medical history significant for diabetes, hyperlipidemia, hypertension, diet, and end-stage renal disease on hemodialysis Tuesday-Thursday-Saturday; who presented to the hospital for her scheduled hemodialysis with Dr. Bascom Levels. At this point patient denies any chest pain, any shortness of breath, any nausea/vomiting, any abdominal pain or any other acute complaints.  Hospital Course:  1-ESRD on HD: Patient came into the hospital for a stable hemodialysis. She was no having any signs of fluid overload or any other acute complaints. After hemodialysis was done patient went home without having any formal discharge. She will follow with her PCP and nephrologist as an outpatient.  No medications changes has been made, the rest of the patient's medical problems remains stable.  At discharge patient vital signs demonstrate a temperature of 98.3 a heart rate of 72 respiratory rate 20 blood pressure 127/65 oxygen saturation 93% on room air.   Time spent on Discharge: 20 minutes  Signed: Alexsis Branscom 04/05/2011, 1:28 PM

## 2011-04-06 ENCOUNTER — Encounter (HOSPITAL_COMMUNITY): Payer: Self-pay | Admitting: Emergency Medicine

## 2011-04-06 ENCOUNTER — Observation Stay (HOSPITAL_COMMUNITY)
Admission: EM | Admit: 2011-04-06 | Discharge: 2011-04-06 | Disposition: A | Discharge status: Home / Self Care | Payer: Medicare HMO | Attending: Internal Medicine | Admitting: Internal Medicine

## 2011-04-06 DIAGNOSIS — N2581 Secondary hyperparathyroidism of renal origin: Secondary | ICD-10-CM | POA: Diagnosis present

## 2011-04-06 DIAGNOSIS — D631 Anemia in chronic kidney disease: Secondary | ICD-10-CM | POA: Insufficient documentation

## 2011-04-06 DIAGNOSIS — E785 Hyperlipidemia, unspecified: Secondary | ICD-10-CM | POA: Diagnosis present

## 2011-04-06 DIAGNOSIS — Z992 Dependence on renal dialysis: Secondary | ICD-10-CM | POA: Insufficient documentation

## 2011-04-06 DIAGNOSIS — D638 Anemia in other chronic diseases classified elsewhere: Secondary | ICD-10-CM | POA: Diagnosis present

## 2011-04-06 DIAGNOSIS — I120 Hypertensive chronic kidney disease with stage 5 chronic kidney disease or end stage renal disease: Principal | ICD-10-CM | POA: Insufficient documentation

## 2011-04-06 DIAGNOSIS — E119 Type 2 diabetes mellitus without complications: Secondary | ICD-10-CM | POA: Insufficient documentation

## 2011-04-06 DIAGNOSIS — N186 End stage renal disease: Secondary | ICD-10-CM | POA: Insufficient documentation

## 2011-04-06 LAB — BASIC METABOLIC PANEL
BUN: 81 mg/dL — ABNORMAL HIGH (ref 6–23)
Calcium: 10.4 mg/dL (ref 8.4–10.5)
GFR calc non Af Amer: 4 mL/min — ABNORMAL LOW (ref >90)
Glucose, Bld: 134 mg/dL — ABNORMAL HIGH (ref 70–99)
Sodium: 139 mEq/L (ref 135–145)

## 2011-04-06 MED ORDER — HEPARIN SODIUM (PORCINE) 1000 UNIT/ML DIALYSIS
20.0000 [IU]/kg | INTRAMUSCULAR | Status: DC | PRN
  Filled 2011-04-06: qty 2

## 2011-04-06 NOTE — ED Notes (Signed)
Pt. Discharged to home via w/c, pt. Alert and oriented, NAD noted 

## 2011-04-06 NOTE — Progress Notes (Signed)
Utilization review completed- post discharge 

## 2011-04-06 NOTE — ED Notes (Signed)
Blood sugar 141

## 2011-04-06 NOTE — Consult Note (Signed)
Pt. In for dialysis. Pt. Also has an appt. In the AM 9 oclock  At [facility]. May not be able to dialyze tonight if too late.will write orders,however.

## 2011-04-06 NOTE — ED Notes (Signed)
Dr. Jamey Ripa consulted regarding patient's dialysis treatment today.  Dr. Jamey Ripa indicated he will be coming to see patient in the ED.

## 2011-04-06 NOTE — Discharge Summary (Signed)
Patient ID: Alexandra [name] MRN: [id number] DOB/AGE: 1946-02-11 66 y.o.  Admit date: 04/06/2011 Discharge date: 04/06/2011  Primary Care Physician:  [name], MD, MD  Discharge Diagnoses:   04/06/2011  Present on Admission:  .End stage renal disease .HTN (hypertension) .DM (diabetes mellitus) .Hyperparathyroidism, secondary renal .Hyperlipidemia .Anemia of chronic disease .Vasovagal syncope .Gout    Medication List  As of 04/06/2011 10:41 PM   TAKE these medications         allopurinol 100 MG tablet   Commonly known as: ZYLOPRIM   Take 100 mg by mouth daily.      amLODipine 5 MG tablet   Commonly known as: NORVASC   Take 5 mg by mouth at bedtime.      calcium acetate 667 MG capsule   Commonly known as: PHOSLO   Take 1,334 mg by mouth 3 (three) times daily with meals.      Coenzyme Q10 150 MG Caps   Take 300 mg by mouth daily.      colchicine 0.6 MG tablet   Take 0.6 mg by mouth daily as needed. For gout.      docusate sodium 100 MG capsule   Commonly known as: COLACE   Take 200 mg by mouth at bedtime.      hydrALAZINE 25 MG tablet   Commonly known as: APRESOLINE   Take 75 mg by mouth 3 (three) times daily.      l-methylfolate-B6-B12 3-35-2 MG Tabs   Commonly known as: METANX   Take 1 tablet by mouth 2 (two) times daily.      losartan 50 MG tablet   Commonly known as: COZAAR   Take 50 mg by mouth at bedtime.      metoprolol succinate 50 MG 24 hr tablet   Commonly known as: TOPROL-XL   Take 50 mg by mouth at bedtime.      multivitamin Tabs tablet   Take 1 tablet by mouth daily.      repaglinide 0.5 MG tablet   Commonly known as: PRANDIN   Take 0.5 mg by mouth 3 (three) times daily before meals.            Disposition and Follow-up:  Follow up for scheduled HD  Consults:   Charles frazer  Significant Diagnostic Studies:  No results found.  Brief H and P: For complete details please refer to admission H and P, but in brief  66 year old with PMH significant for ESRD, DM, Hypertension on dialysis, (Tu, Thu, Sat) presents for dialysis. Patient denies Nausea, vomiting, chest pain or dyspnea. Denies any other complaints . Has appt at [facility] tomorrow for ? evaluation for transplant and wants to leave possibly after HD.   Physical Exam on Discharge:  Filed Vitals:   04/06/11 1455 04/06/11 1823  BP: 159/49 140/51  Pulse: 78 71  Temp: 98.4 F (36.9 C) 97.5 F (36.4 C)  TempSrc: Oral Oral  Resp: 20 19  Weight:  58.968 kg (130 lb)  SpO2: 98% 100%    No intake or output data in the 24 hours ending 04/06/11 2241  General: Alert, awake, oriented x3, in no acute distress. HEENT: No bruits, no goiter. Heart: Regular rate and rhythm, without murmurs, rubs, gallops. Lungs: Clear to auscultation bilaterally. Abdomen: Soft, nontender, nondistended, positive bowel sounds. Extremities: No clubbing cyanosis or edema with positive pedal pulses. Neuro: Grossly intact, nonfocal.  CBC:    Component Value Date/Time   WBC 5.3 04/03/2011 1543   HGB 13.1  04/03/2011 1543   HCT 41.2 04/03/2011 1543   PLT 131* 04/03/2011 1543   MCV 93.8 04/03/2011 1543   NEUTROABS 2.5 04/03/2011 1543   LYMPHSABS 2.0 04/03/2011 1543   MONOABS 0.7 04/03/2011 1543   EOSABS 0.2 04/03/2011 1543   BASOSABS 0.0 04/03/2011 1543    Basic Metabolic Panel:    Component Value Date/Time   NA 139 04/06/2011 1953   K 4.6 04/06/2011 1953   CL 97 04/06/2011 1953   CO2 28 04/06/2011 1953   BUN 81* 04/06/2011 1953   CREATININE 9.36* 04/06/2011 1953   GLUCOSE 134* 04/06/2011 1953   CALCIUM 10.4 04/06/2011 1953   CALCIUM 7.2* 08/22/2009 0440    Hospital Course:  Patient was admitted for scheduled HD. She has an appointment at [facility] tomorrow morning for transplant which she cannot miss.  She might be late for her HD today . She is clinically stable and her k checked was 4.6. Dr Ola Spurr recommended that she could go home and come back on Thursday for her next HD.    Patient can be discharge home with follow up.  Time spent on Discharge: 25 minutes  Signed: Eddie North 04/06/2011, 10:41 PM

## 2011-04-06 NOTE — H&P (Signed)
PCP:  [name], MD, MD   DOA:  04/06/2011  2:53 PM  Chief Complaint:  Presented for scheduled HD  HPI: 66 year old with PMH significant for ESRD, DM, Hypertension on dialysis, (Tu, Thu, Sat) presents for dialysis. Patient denies Nausea, vomiting, chest pain or dyspnea. Denies any other complaints . Has appt at [facility] tomorrow for ? evaluation for transplant and wants to leave possibly after HD.   Allergies: Allergies  Allergen Reactions  . Codeine Other (See Comments)    Passes out  . Epinephrine Other (See Comments)    Tachycardia, diaphoresis, syncope  . Percocet (Oxycodone-Acetaminophen) Other (See Comments)    Passes  out    Prior to Admission medications   Medication Sig Start Date End Date Taking? Authorizing Provider  allopurinol (ZYLOPRIM) 100 MG tablet Take 100 mg by mouth daily.     Yes Historical Provider, MD  amLODipine (NORVASC) 5 MG tablet Take 5 mg by mouth at bedtime.    Yes Historical Provider, MD  calcium acetate (PHOSLO) 667 MG capsule Take 1,334 mg by mouth 3 (three) times daily with meals.    Yes Historical Provider, MD  Coenzyme Q10 150 MG CAPS Take 300 mg by mouth daily.    Yes Historical Provider, MD  colchicine 0.6 MG tablet Take 0.6 mg by mouth daily as needed. For gout.   Yes Historical Provider, MD  docusate sodium (COLACE) 100 MG capsule Take 200 mg by mouth at bedtime.     Yes Historical Provider, MD  hydrALAZINE (APRESOLINE) 25 MG tablet Take 75 mg by mouth 3 (three) times daily.    Yes Historical Provider, MD  l-methylfolate-B6-B12 (METANX) 3-35-2 MG TABS Take 1 tablet by mouth 2 (two) times daily.    Yes Historical Provider, MD  losartan (COZAAR) 50 MG tablet Take 50 mg by mouth at bedtime.    Yes Historical Provider, MD  metoprolol (TOPROL-XL) 50 MG 24 hr tablet Take 50 mg by mouth at bedtime.    Yes Historical Provider, MD  multivitamin (RENA-VIT) TABS tablet Take 1 tablet by mouth daily.     Yes Historical Provider, MD  repaglinide  (PRANDIN) 0.5 MG tablet Take 0.5 mg by mouth 3 (three) times daily before meals.     Yes Historical Provider, MD    Past Medical History  Diagnosis Date  . Chronic kidney disease     esrd  . Diabetes mellitus   . Hyperlipidemia   . Hypertension   . Renal disorder   . Dialysis care     tues, thurs, sat  . 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 Carotid endarterectomy 2002    left  . Btl   . Rectal fissurectomy   . Av fistula placement 10/30/2010    right brachiocephalic   . Fistulogram     Social History:  reports that she has been smoking Cigarettes.  She has a 50 pack-year smoking history. She has never used smokeless tobacco. She reports that she does not drink alcohol or use illicit drugs.  Family History  Problem Relation Age of Onset  . COPD Mother   . Kidney disease Mother   . Heart disease Father   . Lymphoma Son     Review of Systems:  Constitutional: Denies fever, chills, diaphoresis, appetite change and fatigue.  HEENT: Denies photophobia, eye pain, redness, hearing loss, ear pain, congestion, sore throat, rhinorrhea, sneezing, mouth sores, trouble swallowing, neck pain, neck stiffness  and tinnitus.   Respiratory: Denies SOB, DOE, cough, chest tightness,  and wheezing.   Cardiovascular: Denies chest pain, palpitations and leg swelling.  Gastrointestinal: Denies nausea, vomiting, abdominal pain, diarrhea, constipation, blood in stool and abdominal distention.  Genitourinary: Denies dysuria, urgency, frequency, hematuria, flank pain and difficulty urinating.  Musculoskeletal: Denies myalgias, back pain, joint swelling, arthralgias and gait problem.  Skin: Denies pallor, rash and wound.  Neurological: Denies dizziness, seizures, syncope, weakness, light-headedness, numbness and headaches.  Hematological: Denies adenopathy. Easy bruising, personal or family bleeding history  Psychiatric/Behavioral: Denies suicidal ideation, mood  changes, confusion, nervousness, sleep disturbance and agitation   Physical Exam:  Filed Vitals:   04/06/11 1455  BP: 159/49  Pulse: 78  Temp: 98.4 F (36.9 C)  TempSrc: Oral  Resp: 20  SpO2: 98%    Constitutional: Vital signs reviewed.  Patient is a well-developed and well-nourished in no acute distress and cooperative with exam. Head: Normocephalic and atraumatic Ear: TM normal bilaterally Mouth: no erythema or exudates, MMM Eyes: PERRL, EOMI, conjunctivae normal, No scleral icterus.  Neck: Supple, Trachea midline normal ROM, No JVD, mass, thyromegaly, or carotid bruit present.  Cardiovascular: RRR, S1 normal, S2 normal, no MRG, pulses symmetric and intact bilaterally Pulmonary/Chest: CTAB, no wheezes, rales, or rhonchi. Rt sided permcath  Abdominal: Soft. Non-tender, non-distended, bowel sounds are normal, no masses, organomegaly, or guarding present.  GU: no CVA tenderness Musculoskeletal: No joint deformities, erythema, or stiffness, ROM full and no nontender Ext: no edema and no cyanosis, pulses palpable bilaterally (DP and PT) Hematology: no cervical, inginal, or axillary adenopathy.  Neurological: A&O x3, Strenght is normal and symmetric bilaterally, cranial nerve II-XII are grossly intact, no focal motor deficit, sensory intact to light touch bilaterally.  Skin: Warm, dry and intact. No rash, cyanosis, or clubbing.  Psychiatric: Normal mood and affect. speech and behavior is normal. Judgment and thought content normal. Cognition and memory are normal.   Labs on Admission:  Results for orders placed during the hospital encounter of 04/06/11 (from the past 48 hour(s))  GLUCOSE, CAPILLARY     Status: Abnormal   Collection Time   04/06/11  3:02 PM      Component Value Range Comment   Glucose-Capillary 144 (*) 70 - 99 (mg/dL)     Radiological Exams on Admission: No results found.  Assessment/Plan 66 year old with PMH significant for ESRD, DM, Hypertension on dialysis,  (Tu, Thu, Sat) presents for dialysis   End stage renal disease Patient here for her scheduled HD today. Dr Ola Spurr notidfied and will have her scheuled for HD Patient clinically is stable and in no signs of fluid overload   HTN (hypertension) Stable continue home meds   DM (diabetes mellitus) Not on any meds  Her remaining medical issues are stable and her home medications will be resumed.   Full code  Time Spent on Admission: 40 minutes  Jerrad Mendibles 04/06/2011, 4:51 PM

## 2011-04-06 NOTE — ED Notes (Signed)
Pt here for dialysis states has no issues at all . Is suppose to go to [facility] for pre screen for kidney transplant tomorrow

## 2011-04-06 NOTE — ED Provider Notes (Signed)
History     CSN: [id number]  Arrival date & time 04/06/11  1451   First MD Initiated Contact with Patient 04/06/11 1518      Chief Complaint  Patient presents with  . Shortness of Breath     The history is provided by the patient and medical records.   patient presents today for routine dialysis.  She normally receives dialysis on Tuesday Thursdays and Saturdays.  Her last dialysis was 3 days ago (Saturday).  She presents today requesting dialysis.  She has no complaints at this time.  She denies chest pain shortness of breath.  She denies orthopnea and dyspnea on exertion.  She dialyzes through her right subclavian permacath.  She's had no fevers or chills.  She has no other complaints.  Past Medical History  Diagnosis Date  . Chronic kidney disease     esrd  . Diabetes mellitus   . Hyperlipidemia   . Hypertension   . Renal disorder   . Dialysis care     tues, thurs, sat  . 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 Carotid endarterectomy 2002    left  . Btl   . Rectal fissurectomy   . Av fistula placement 10/30/2010    right brachiocephalic   . Fistulogram     Family History  Problem Relation Age of Onset  . COPD Mother   . Kidney disease Mother   . Heart disease Father   . Lymphoma Son     History  Substance Use Topics  . Smoking status: Current Everyday Smoker -- 1.0 packs/day for 50 years    Types: Cigarettes  . Smokeless tobacco: Never Used  . Alcohol Use: No    OB History    Grav Para Term Preterm Abortions TAB SAB Ect Mult Living                  Review of Systems  Respiratory: Positive for shortness of breath.   All other systems reviewed and are negative.    Allergies  Codeine; Epinephrine; and Percocet  Home Medications   Current Outpatient Rx  Name Route Sig Dispense Refill  . ALLOPURINOL 100 MG PO TABS Oral Take 100 mg by mouth daily.      Marland Kitchen AMLODIPINE BESYLATE 5 MG PO TABS Oral Take 5 mg by mouth  at bedtime.     Marland Kitchen CALCIUM ACETATE 667 MG PO CAPS Oral Take 1,334 mg by mouth 3 (three) times daily with meals.     Marland Kitchen COENZYME Q10 150 MG PO CAPS Oral Take 300 mg by mouth daily.     . COLCHICINE 0.6 MG PO TABS Oral Take 0.6 mg by mouth daily as needed. For gout.    Marland Kitchen DOCUSATE SODIUM 100 MG PO CAPS Oral Take 200 mg by mouth at bedtime.      Marland Kitchen HYDRALAZINE HCL 25 MG PO TABS Oral Take 75 mg by mouth 3 (three) times daily.     . L-METHYLFOLATE-B6-B12 3-35-2 MG PO TABS Oral Take 1 tablet by mouth 2 (two) times daily.     Marland Kitchen LOSARTAN POTASSIUM 50 MG PO TABS Oral Take 50 mg by mouth at bedtime.     Marland Kitchen METOPROLOL SUCCINATE ER 50 MG PO TB24 Oral Take 50 mg by mouth at bedtime.     Marland Kitchen RENA-VITE PO TABS Oral Take 1 tablet by mouth daily.      Marland Kitchen REPAGLINIDE 0.5 MG PO TABS Oral  Take 0.5 mg by mouth 3 (three) times daily before meals.        BP 159/49  Pulse 78  Temp(Src) 98.4 F (36.9 C) (Oral)  Resp 20  SpO2 98%  Physical Exam  Nursing note and vitals reviewed. Constitutional: She is oriented to person, place, and time. She appears well-developed and well-nourished. No distress.  HENT:  Head: Normocephalic and atraumatic.  Eyes: EOM are normal.  Neck: Normal range of motion.  Cardiovascular: Normal rate, regular rhythm and normal heart sounds.   Pulmonary/Chest: Effort normal and breath sounds normal.  Abdominal: Soft. She exhibits no distension. There is no tenderness.  Musculoskeletal: Normal range of motion.  Neurological: She is alert and oriented to person, place, and time.  Skin: Skin is warm and dry.  Psychiatric: She has a normal mood and affect. Judgment normal.    ED Course  Procedures (including critical care time)  Labs Reviewed  GLUCOSE, CAPILLARY - Abnormal; Notable for the following:    Glucose-Capillary 144 (*)    All other components within normal limits  CBC  BASIC METABOLIC PANEL   No results found.       1. End stage renal disease   MDM  We'll discuss the  case with her nephrologist.  3:29 PM Spoke with Dr Jeri Cos who requests the patient be admitted through the [facility]. She requests that blood be obtained from her perm cath in dialysis because she is a difficult IV start      Lyanne Co, MD 04/06/11 [phone]

## 2011-04-07 NOTE — Progress Notes (Signed)
Retro ur ins reivew 

## 2011-04-08 ENCOUNTER — Emergency Department (HOSPITAL_COMMUNITY): Admit: 2011-04-08 | Discharge: 2011-04-08 | Disposition: A | Discharge status: Home / Self Care | Payer: Medicare HMO

## 2011-04-08 ENCOUNTER — Encounter (HOSPITAL_COMMUNITY): Payer: Self-pay | Admitting: *Deleted

## 2011-04-08 ENCOUNTER — Observation Stay (HOSPITAL_COMMUNITY)
Admission: EM | Admit: 2011-04-08 | Discharge: 2011-04-08 | Disposition: A | Discharge status: Home / Self Care | Payer: Medicare HMO | Attending: Internal Medicine | Admitting: Internal Medicine

## 2011-04-08 DIAGNOSIS — E785 Hyperlipidemia, unspecified: Secondary | ICD-10-CM | POA: Insufficient documentation

## 2011-04-08 DIAGNOSIS — E119 Type 2 diabetes mellitus without complications: Secondary | ICD-10-CM | POA: Insufficient documentation

## 2011-04-08 DIAGNOSIS — I120 Hypertensive chronic kidney disease with stage 5 chronic kidney disease or end stage renal disease: Secondary | ICD-10-CM | POA: Insufficient documentation

## 2011-04-08 DIAGNOSIS — N186 End stage renal disease: Secondary | ICD-10-CM | POA: Insufficient documentation

## 2011-04-08 DIAGNOSIS — Z992 Dependence on renal dialysis: Secondary | ICD-10-CM | POA: Insufficient documentation

## 2011-04-08 LAB — CBC
HCT: 38.2 % (ref 36.0–46.0)
Hemoglobin: 12.7 g/dL (ref 12.0–15.0)
MCHC: 33.2 g/dL (ref 30.0–36.0)
WBC: 7.3 10*3/uL (ref 4.0–10.5)

## 2011-04-08 LAB — RENAL FUNCTION PANEL
BUN: 111 mg/dL — ABNORMAL HIGH (ref 6–23)
CO2: 23 mEq/L (ref 19–32)
Chloride: 96 mEq/L (ref 96–112)
Creatinine, Ser: 11.84 mg/dL — ABNORMAL HIGH (ref 0.50–1.10)
Glucose, Bld: 154 mg/dL — ABNORMAL HIGH (ref 70–99)

## 2011-04-08 LAB — DIFFERENTIAL
Lymphocytes Relative: 34 % (ref 12–46)
Lymphs Abs: 2.5 10*3/uL (ref 0.7–4.0)
Monocytes Absolute: 0.5 10*3/uL (ref 0.1–1.0)
Monocytes Relative: 7 % (ref 3–12)
Neutro Abs: 4 10*3/uL (ref 1.7–7.7)

## 2011-04-08 MED ORDER — HEPARIN SODIUM (PORCINE) 1000 UNIT/ML DIALYSIS: 20.0000 [IU]/kg | INTRAMUSCULAR | Status: DC | PRN

## 2011-04-08 NOTE — ED Notes (Signed)
Dr. Wynelle Bourgeois at bedside to evaluate pt for admission to receive hemodialysis.

## 2011-04-08 NOTE — ED Notes (Signed)
Called dialysis. They are aware patient is in ED and will send for patient when ready.

## 2011-04-08 NOTE — Consults (Signed)
Pt. In today. Needs dialysis. See orders.

## 2011-04-08 NOTE — ED Notes (Signed)
Pt is here for her dialysis treatment.  She tells me that she comes on Tuesday, Thursday and Saturday for this.  Dr. Bascom Levels called me and told me that she should be admitted by [facility].  Per pt she usually goes home after dialysis.  No sob, no CP.  Pt is here only for dialysis

## 2011-04-08 NOTE — ED Notes (Signed)
Patient resting in PTO5 and dialysis is being notified patient is here in the [facility].

## 2011-04-08 NOTE — ED Provider Notes (Signed)
History     CSN: [id number]  Arrival date & time 04/08/11  1435   First MD Initiated Contact with Patient 04/08/11 1525      Chief Complaint  Patient presents with  . Chronic Renal Failure    pt is here for her dialysis    (Consider location/radiation/quality/duration/timing/severity/associated sxs/prior treatment) HPI Comments: Patient is a 66 year old female with a history of end-stage renal disease who presents to the ER today for dialysis. She normally has dialysis on Tuesdays, Thursdays, Saturdays and she has not missed any sessions. She denies any shortness of breath, swelling, chest pain. She's taking all her medications. Dr. Bascom [name] has already seen her in the emergency room and she is waiting for dialysis.  The history is provided by the patient.    Past Medical History  Diagnosis Date  . Chronic kidney disease     esrd  . Diabetes mellitus   . Hyperlipidemia   . Hypertension   . Renal disorder   . Dialysis care     tues, thurs, sat  . 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 Carotid endarterectomy 2002    left  . Btl   . Rectal fissurectomy   . Av fistula placement 10/30/2010    right brachiocephalic   . Fistulogram     Family History  Problem Relation Age of Onset  . COPD Mother   . Kidney disease Mother   . Heart disease Father   . Lymphoma Son     History  Substance Use Topics  . Smoking status: Current Everyday Smoker -- 1.0 packs/day for 50 years    Types: Cigarettes  . Smokeless tobacco: Never Used  . Alcohol Use: No    OB History    Grav Para Term Preterm Abortions TAB SAB Ect Mult Living                  Review of Systems  Constitutional: Negative for fever.  Respiratory: Negative for cough and shortness of breath.   Cardiovascular: Negative for chest pain and leg swelling.  Gastrointestinal: Negative for nausea, vomiting and diarrhea.  Skin: Negative for wound.  All other systems reviewed and  are negative.    Allergies  Codeine; Epinephrine; and Percocet  Home Medications   Current Outpatient Rx  Name Route Sig Dispense Refill  . ALLOPURINOL 100 MG PO TABS Oral Take 100 mg by mouth daily.      Marland Kitchen AMLODIPINE BESYLATE 5 MG PO TABS Oral Take 5 mg by mouth at bedtime.     Marland Kitchen CALCIUM ACETATE 667 MG PO CAPS Oral Take 1,334 mg by mouth 3 (three) times daily with meals.     Marland Kitchen COENZYME Q10 150 MG PO CAPS Oral Take 300 mg by mouth daily.     . COLCHICINE 0.6 MG PO TABS Oral Take 0.6 mg by mouth daily as needed. For gout.    Marland Kitchen DOCUSATE SODIUM 100 MG PO CAPS Oral Take 200 mg by mouth at bedtime.      Marland Kitchen HYDRALAZINE HCL 25 MG PO TABS Oral Take 75 mg by mouth 3 (three) times daily.     . L-METHYLFOLATE-B6-B12 3-35-2 MG PO TABS Oral Take 1 tablet by mouth 2 (two) times daily.     Marland Kitchen LOSARTAN POTASSIUM 50 MG PO TABS Oral Take 50 mg by mouth at bedtime.     Marland Kitchen METOPROLOL SUCCINATE ER 50 MG PO TB24 Oral Take 50 mg by  mouth at bedtime.     Marland Kitchen RENA-VITE PO TABS Oral Take 1 tablet by mouth daily.      Marland Kitchen REPAGLINIDE 0.5 MG PO TABS Oral Take 0.5 mg by mouth 3 (three) times daily before meals.        BP 185/63  Temp(Src) 97.9 F (36.6 C) (Oral)  Resp 16  SpO2 98%  Physical Exam  Nursing note and vitals reviewed. Constitutional: She is oriented to person, place, and time. She appears well-developed and well-nourished. No distress.  HENT:  Head: Normocephalic and atraumatic.  Eyes: EOM are normal. Pupils are equal, round, and reactive to light.  Cardiovascular: Normal rate, regular rhythm, normal heart sounds and intact distal pulses.  Exam reveals no friction rub.   No murmur heard. Pulmonary/Chest: Effort normal and breath sounds normal. She has no wheezes. She has no rales.  Musculoskeletal: She exhibits no edema.       No edema  Neurological: She is alert and oriented to person, place, and time. No cranial nerve deficit.  Skin: Skin is warm and dry. No rash noted.  Psychiatric: She has a  normal mood and affect. Her behavior is normal.    ED Course  Procedures (including critical care time)  Labs Reviewed - No data to display No results found.   1. Hemodialysis patient       MDM   Patient is here for dialysis. She was already seen by Dr. Bascom [name] and is here for her routine dialysis which she gets Tuesdays Thursdays and Saturdays. She has no complaints. No shortness of breath no chest pain and is otherwise well appearing with a normal exam other than hypertension.      Gwyneth Sprout, MD 04/08/11 1625

## 2011-04-08 NOTE — H&P (Signed)
PCP:  [name], MD, MD Chief Complaint:  "I'm here for dialysis."  HPI:  Patient is a 66 year old African American female with history of ESRD on dialysis, hypertension, and diabetes mellitus presenting to the emergency room for her regular dialysis. She denies any history of chest pain or shortness of breath. No fever, chills or rigors. She also denies any history of of abdominal discomfort. No diarrhea or hematochezia. No dysuria or hematuria.  Patient was seen by the nephrologist Dr. Bascom Levels, evaluated and wrote dialysis orders. Patient is to have dialysis today and  thereafter discharge home.  Review of Systems: Patient denies any complaints.  The patient denies anorexia, fever, weight loss,, vision loss, decreased hearing, hoarseness, chest pain, syncope, dyspnea on exertion, peripheral edema, balance deficits, hemoptysis, abdominal pain, melena, hematochezia, severe indigestion/heartburn, hematuria, incontinence, genital sores, muscle weakness, suspicious skin lesions, transient blindness, difficulty walking, depression, unusual weight change, abnormal bleeding, enlarged lymph nodes, angioedema, and breast masses.  Past Medical History:  Past Medical History  Diagnosis Date  . Chronic kidney disease     esrd  . Diabetes mellitus   . Hyperlipidemia   . Hypertension   . Renal disorder   . Dialysis care     tues, thurs, sat  . 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 Carotid endarterectomy 2002    left  . Btl   . Rectal fissurectomy   . Av fistula placement 10/30/2010    right brachiocephalic   . Fistulogram     Medications:  Prior to Admission medications   Medication Sig Start Date End Date Taking? Authorizing Provider  allopurinol (ZYLOPRIM) 100 MG tablet Take 100 mg by mouth daily.     Yes Historical Provider, MD  amLODipine (NORVASC) 5 MG tablet Take 5 mg by mouth at bedtime.    Yes Historical Provider, MD  calcium  acetate (PHOSLO) 667 MG capsule Take 1,334 mg by mouth 3 (three) times daily with meals.    Yes Historical Provider, MD  Coenzyme Q10 150 MG CAPS Take 300 mg by mouth daily.    Yes Historical Provider, MD  colchicine 0.6 MG tablet Take 0.6 mg by mouth daily as needed. For gout.   Yes Historical Provider, MD  docusate sodium (COLACE) 100 MG capsule Take 200 mg by mouth at bedtime.     Yes Historical Provider, MD  hydrALAZINE (APRESOLINE) 25 MG tablet Take 75 mg by mouth 3 (three) times daily.    Yes Historical Provider, MD  l-methylfolate-B6-B12 (METANX) 3-35-2 MG TABS Take 1 tablet by mouth 2 (two) times daily.    Yes Historical Provider, MD  losartan (COZAAR) 50 MG tablet Take 50 mg by mouth at bedtime.    Yes Historical Provider, MD  metoprolol (TOPROL-XL) 50 MG 24 hr tablet Take 50 mg by mouth at bedtime.    Yes Historical Provider, MD  multivitamin (RENA-VIT) TABS tablet Take 1 tablet by mouth daily.     Yes Historical Provider, MD  repaglinide (PRANDIN) 0.5 MG tablet Take 0.5 mg by mouth 3 (three) times daily before meals.     Yes Historical Provider, MD    Allergies:  Allergies  Allergen Reactions  . Codeine Other (See Comments)    Passes out  . Epinephrine Other (See Comments)    Tachycardia, diaphoresis, syncope  . Percocet (Oxycodone-Acetaminophen) Other (See Comments)    Passes  out    Social History:   reports that she has been  smoking Cigarettes.  She has a 50 pack-year smoking history. She has never used smokeless tobacco. She reports that she does not drink alcohol or use illicit drugs.  Family History:  Family History  Problem Relation Age of Onset  . COPD Mother   . Kidney disease Mother   . Heart disease Father   . Lymphoma Son     Physical Exam:  Filed Vitals:   04/08/11 1448  BP: 185/63  Temp: 97.9 F (36.6 C)  TempSrc: Oral  Resp: 16  SpO2: 98%      General: Alert and oriented times three, well developed and nourished, no acute distress, mild  pallor  Eyes: PERRLA, pink conjunctiva, scleral icterus  ENT: Moist oral mucosa, neck supple, no thyromegaly  Lungs: clear to ascultation, no wheeze, no crackles, no use of accessory muscles  Cardiovascular: regular rate and rhythm, no regurgitation, no gallops, no murmurs. No carotid bruits, no JVD  Abdomen: soft, positive BS, non-tender, non-distended, no organomegaly, not an acute abdomen  GU: not examined  Neuro: Non focal  Musculoskeletal: Trace pedal edema  Skin: no ecchymosis  Psych: appropriate patient  ?  Labs on Admission:   [facility] 04/06/11 1953  NA 139  K 4.6  CL 97  CO2 28  GLUCOSE 134*  BUN 81*  CREATININE 9.36*  CALCIUM 10.4  MG --  PHOS --    No results found for this basename: AST:2,ALT:2,ALKPHOS:2,BILITOT:2,PROT:2,ALBUMIN:2 in the last 72 hours  No results found for this basename: LIPASE:2,AMYLASE:2 in the last 72 hours  No results found for this basename: WBC:2,NEUTROABS:2,HGB:2,HCT:2,MCV:2,PLT:2 in the last 72 hours  No results found for this basename: CKTOTAL:3,CKMB:3,CKMBINDEX:3,TROPONINI:3 in the last 72 hours  No results found for this basename: TSH,T4TOTAL,FREET3,T3FREE,THYROIDAB in the last 72 hours  No results found for this basename: VITAMINB12:2,FOLATE:2,FERRITIN:2,TIBC:2,IRON:2,RETICCTPCT:2 in the last 72 hours  Radiological Exams on Admission:  No results found.  Assessment/Plan  Present on Admission:  Problems: #1 dialysis patient #2 trace pedal edema  Impression: #1 ESRD on dialysis. #2 hypertension #3 diabetes mellitus #4 hyperlipidemia #5 secondary hyperparathyroidism #6 anemia.  Plan: #1 admit patient for observation. #2 dialysis as per  Dr. Janey Greaser order #3 restart home meds #4 after dialysis, patient should be discharge home. Talmage Nap     [phone]

## 2011-04-08 NOTE — ED Notes (Signed)
Called dialysis to check to see if they were ready for the pt, which they were.  I ordered the pt a dinner tray that has not yet arrived to the floor.  When tray arrives, it will be sent to dialysis.Pt sent to dialysis via stretcher.

## 2011-04-10 ENCOUNTER — Emergency Department (HOSPITAL_COMMUNITY): Admit: 2011-04-10 | Discharge: 2011-04-10 | Disposition: A | Discharge status: Home / Self Care | Payer: Medicare HMO

## 2011-04-10 ENCOUNTER — Encounter (HOSPITAL_COMMUNITY): Payer: Self-pay | Admitting: Emergency Medicine

## 2011-04-10 ENCOUNTER — Observation Stay (HOSPITAL_COMMUNITY)
Admission: EM | Admit: 2011-04-10 | Discharge: 2011-04-10 | Disposition: A | Discharge status: Home / Self Care | Payer: Medicare HMO | Attending: Internal Medicine | Admitting: Internal Medicine

## 2011-04-10 DIAGNOSIS — M109 Gout, unspecified: Secondary | ICD-10-CM | POA: Insufficient documentation

## 2011-04-10 DIAGNOSIS — E785 Hyperlipidemia, unspecified: Secondary | ICD-10-CM | POA: Insufficient documentation

## 2011-04-10 DIAGNOSIS — F17200 Nicotine dependence, unspecified, uncomplicated: Secondary | ICD-10-CM | POA: Insufficient documentation

## 2011-04-10 DIAGNOSIS — N2581 Secondary hyperparathyroidism of renal origin: Secondary | ICD-10-CM | POA: Insufficient documentation

## 2011-04-10 DIAGNOSIS — I120 Hypertensive chronic kidney disease with stage 5 chronic kidney disease or end stage renal disease: Secondary | ICD-10-CM | POA: Insufficient documentation

## 2011-04-10 DIAGNOSIS — E119 Type 2 diabetes mellitus without complications: Secondary | ICD-10-CM | POA: Insufficient documentation

## 2011-04-10 DIAGNOSIS — N186 End stage renal disease: Secondary | ICD-10-CM | POA: Insufficient documentation

## 2011-04-10 DIAGNOSIS — Z992 Dependence on renal dialysis: Principal | ICD-10-CM | POA: Insufficient documentation

## 2011-04-10 LAB — RENAL FUNCTION PANEL
CO2: 25 mEq/L (ref 19–32)
Calcium: 9.4 mg/dL (ref 8.4–10.5)
Creatinine, Ser: 9.19 mg/dL — ABNORMAL HIGH (ref 0.50–1.10)
GFR calc Af Amer: 4 mL/min — ABNORMAL LOW (ref >90)
Glucose, Bld: 159 mg/dL — ABNORMAL HIGH (ref 70–99)

## 2011-04-10 LAB — CBC
HCT: 36.4 % (ref 36.0–46.0)
MCV: 92.9 fL (ref 78.0–100.0)
RBC: 3.92 MIL/uL (ref 3.87–5.11)
WBC: 6.9 10*3/uL (ref 4.0–10.5)

## 2011-04-10 LAB — DIFFERENTIAL
Eosinophils Relative: 2 % (ref 0–5)
Lymphocytes Relative: 35 % (ref 12–46)
Lymphs Abs: 2.4 10*3/uL (ref 0.7–4.0)
Monocytes Absolute: 0.6 10*3/uL (ref 0.1–1.0)

## 2011-04-10 MED ORDER — NEPRO/CARBSTEADY PO LIQD
237.0000 mL | ORAL | Status: DC | PRN
  Filled 2011-04-10: qty 237

## 2011-04-10 MED ORDER — SODIUM CHLORIDE 0.9 % IV SOLN
100.0000 mL | INTRAVENOUS | Status: DC | PRN
Start: 2011-04-10 — End: 2011-04-11

## 2011-04-10 MED ORDER — ALTEPLASE 2 MG IJ SOLR
2.0000 mg | Freq: Once | INTRAMUSCULAR | Status: DC | PRN
  Filled 2011-04-10: qty 2

## 2011-04-10 MED ORDER — HEPARIN SODIUM (PORCINE) 1000 UNIT/ML DIALYSIS
1000.0000 [IU] | INTRAMUSCULAR | Status: DC | PRN
  Filled 2011-04-10: qty 1

## 2011-04-10 MED ORDER — HEPARIN SODIUM (PORCINE) 1000 UNIT/ML DIALYSIS: 20.0000 [IU]/kg | INTRAMUSCULAR | Status: DC | PRN

## 2011-04-10 MED ORDER — SODIUM CHLORIDE 0.9 % IV SOLN: 100.0000 mL | INTRAVENOUS | Status: DC | PRN

## 2011-04-10 NOTE — Consult Note (Signed)
Pt. In ER she needs dialysis. See orders.

## 2011-04-10 NOTE — ED Provider Notes (Addendum)
History     CSN: [id number]  Arrival date & time 04/10/11  1438   First MD Initiated Contact with Patient 04/10/11 1657      Chief Complaint  Patient presents with  . Vascular Access Problem    (Consider location/radiation/quality/duration/timing/severity/associated sxs/prior treatment) The history is provided by the patient.   Pt states is here for dialyses. Dialyses t/th/sat and states cannot be dialyzed at any of the outpt dialyses centers. Last had her normal hd 2 days ago. Says feels fine, asymptomatic. No sob. No swelling. No weakness or unusual fatigue.    Past Medical History  Diagnosis Date  . Chronic kidney disease     esrd  . Diabetes mellitus   . Hyperlipidemia   . Hypertension   . Renal disorder   . Dialysis care     tues, thurs, sat  . 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 Carotid endarterectomy 2002    left  . Btl   . Rectal fissurectomy   . Av fistula placement 10/30/2010    right brachiocephalic   . Fistulogram     Family History  Problem Relation Age of Onset  . COPD Mother   . Kidney disease Mother   . Heart disease Father   . Lymphoma Son     History  Substance Use Topics  . Smoking status: Current Everyday Smoker -- 1.0 packs/day for 50 years    Types: Cigarettes  . Smokeless tobacco: Never Used  . Alcohol Use: No    OB History    Grav Para Term Preterm Abortions TAB SAB Ect Mult Living                  Review of Systems  Constitutional: Negative for fever and fatigue.  Respiratory: Negative for shortness of breath.   Cardiovascular: Negative for chest pain and leg swelling.  Neurological: Negative for weakness, numbness and headaches.    Allergies  Codeine; Epinephrine; and Percocet  Home Medications   Current Outpatient Rx  Name Route Sig Dispense Refill  . ALLOPURINOL 100 MG PO TABS Oral Take 100 mg by mouth daily.      Marland Kitchen AMLODIPINE BESYLATE 5 MG PO TABS Oral Take 5 mg by mouth  at bedtime.     Marland Kitchen CALCIUM ACETATE 667 MG PO CAPS Oral Take 1,334 mg by mouth 3 (three) times daily with meals.     Marland Kitchen COENZYME Q10 150 MG PO CAPS Oral Take 300 mg by mouth daily.     . COLCHICINE 0.6 MG PO TABS Oral Take 0.6 mg by mouth daily as needed. For gout.    Marland Kitchen DOCUSATE SODIUM 100 MG PO CAPS Oral Take 200 mg by mouth at bedtime.      Marland Kitchen HYDRALAZINE HCL 25 MG PO TABS Oral Take 75 mg by mouth 3 (three) times daily.     . L-METHYLFOLATE-B6-B12 3-35-2 MG PO TABS Oral Take 1 tablet by mouth 2 (two) times daily.     Marland Kitchen LOSARTAN POTASSIUM 50 MG PO TABS Oral Take 50 mg by mouth at bedtime.     Marland Kitchen METOPROLOL SUCCINATE ER 50 MG PO TB24 Oral Take 50 mg by mouth at bedtime.     Marland Kitchen RENA-VITE PO TABS Oral Take 1 tablet by mouth daily.      Marland Kitchen REPAGLINIDE 0.5 MG PO TABS Oral Take 0.5 mg by mouth 3 (three) times daily before meals.  BP 174/53  Pulse 73  Temp 97.2 F (36.2 C)  Resp 20  SpO2 98%  Physical Exam  Nursing note and vitals reviewed. Constitutional: She is oriented to person, place, and time. She appears well-developed and well-nourished. No distress.  Eyes: Conjunctivae are normal. No scleral icterus.  Neck: Neck supple. No tracheal deviation present.  Cardiovascular: Normal rate, regular rhythm, normal heart sounds and intact distal pulses.   Pulmonary/Chest: Effort normal and breath sounds normal. No respiratory distress.  Abdominal: Normal appearance. She exhibits no distension.  Musculoskeletal: She exhibits no edema.  Neurological: She is alert and oriented to person, place, and time.  Skin: Skin is warm and dry. No rash noted.  Psychiatric: She has a normal mood and affect.    ED Course  Procedures (including critical care time)     MDM  Dr Bascom Levels has called in orders for dialyses, he requests [facility] be called to admit for hd. [facility] called-  Will see in ed.   Pt states asymptomatic. Refused labs.       Suzi Roots, MD 04/10/11 1730  Suzi Roots,  MD 04/10/11 1944

## 2011-04-10 NOTE — ED Notes (Signed)
Pt. Here for hemo-dialysis

## 2011-04-10 NOTE — H&P (Signed)
PCP:  [name], MD, MD   DOA:  04/10/2011  3:03 PM  Chief Complaint:  "I am here for dialysis"  HPI: 66 years old African American woman with end-stage renal disease on hemodialysis on Tuesdays, Thursdays and Saturdays who  apparently for some reasons can't be dialyzed as an outpatient and she been coming to the ER to be dialyzed. She denies any shortness of breath or any other complaints. Dr. Bascom Levels was notified by ER and he requested that patient be admitted by [facility] hospitalist and he will consult for dialysis.  Allergies: Allergies  Allergen Reactions  . Codeine Other (See Comments)    Passes out  . Epinephrine Other (See Comments)    Tachycardia, diaphoresis, syncope  . Percocet (Oxycodone-Acetaminophen) Other (See Comments)    Passes  out    Prior to Admission medications   Medication Sig Start Date End Date Taking? Authorizing Provider  allopurinol (ZYLOPRIM) 100 MG tablet Take 100 mg by mouth daily.     Yes Historical Provider, MD  amLODipine (NORVASC) 5 MG tablet Take 5 mg by mouth at bedtime.    Yes Historical Provider, MD  calcium acetate (PHOSLO) 667 MG capsule Take 1,334 mg by mouth 3 (three) times daily with meals.    Yes Historical Provider, MD  Coenzyme Q10 150 MG CAPS Take 300 mg by mouth daily.    Yes Historical Provider, MD  colchicine 0.6 MG tablet Take 0.6 mg by mouth daily as needed. For gout.   Yes Historical Provider, MD  docusate sodium (COLACE) 100 MG capsule Take 200 mg by mouth at bedtime.     Yes Historical Provider, MD  hydrALAZINE (APRESOLINE) 25 MG tablet Take 75 mg by mouth 3 (three) times daily.    Yes Historical Provider, MD  l-methylfolate-B6-B12 (METANX) 3-35-2 MG TABS Take 1 tablet by mouth 2 (two) times daily.    Yes Historical Provider, MD  losartan (COZAAR) 50 MG tablet Take 50 mg by mouth at bedtime.    Yes Historical Provider, MD  metoprolol (TOPROL-XL) 50 MG 24 hr tablet Take 50 mg by mouth at bedtime.    Yes Historical Provider, MD   multivitamin (RENA-VIT) TABS tablet Take 1 tablet by mouth daily.     Yes Historical Provider, MD  repaglinide (PRANDIN) 0.5 MG tablet Take 0.5 mg by mouth 3 (three) times daily before meals.     Yes Historical Provider, MD    Past Medical History  Diagnosis Date  . Chronic kidney disease     esrd  . Diabetes mellitus   . Hyperlipidemia   . Hypertension   . Renal disorder   . Dialysis care     tues, thurs, sat  . 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 Carotid endarterectomy 2002    left  . Btl   . Rectal fissurectomy   . Av fistula placement 10/30/2010    right brachiocephalic   . Fistulogram     Social History:  reports that she has been smoking Cigarettes.  She has a 50 pack-year smoking history.   She reports that she does not drink alcohol or use illicit drugs.  Family History  Problem Relation Age of Onset  . COPD Mother   . Kidney disease Mother   . Heart disease Father   . Lymphoma Son     Review of Systems:  Constitutional: Denies fever, chills, diaphoresis, appetite change and fatigue.  HEENT: Denies photophobia, eye  pain, redness, hearing loss, ear pain, congestion, sore throat, rhinorrhea, sneezing, mouth sores, trouble swallowing, neck pain, neck stiffness and tinnitus.   Respiratory: Denies SOB, DOE, cough, chest tightness,  and wheezing.   Cardiovascular: Denies chest pain, palpitations and leg swelling.  Gastrointestinal: Denies nausea, vomiting, abdominal pain, diarrhea, constipation, blood in stool and abdominal distention.  Genitourinary: Denies dysuria, urgency, frequency, hematuria, flank pain and difficulty urinating.  Musculoskeletal: Denies myalgias, back pain, joint swelling, arthralgias and gait problem.  Skin: Denies pallor, rash and wound.  Neurological: Denies dizziness, seizures, syncope, weakness, light-headedness, numbness and headaches.     Physical Exam:  Filed Vitals:   04/10/11 1509 04/10/11  1815 04/10/11 1842  BP: 174/53 179/73 166/62  Pulse: 73 81 78  Temp: 97.2 F (36.2 C) 97.2 F (36.2 C)   Resp: 20 20   Weight:  60.8 kg (134 lb 0.6 oz)   SpO2: 98% 96%     Constitutional: Vital signs reviewed.  Patient is a  in no acute distress and cooperative with exam. Alert and oriented x3.  Cardiovascular: RRR, S1 normal, S2 normal, no MRG, pulses symmetric and intact bilaterally Pulmonary/Chest: CTAB, no wheezes, rales, or rhonchi Abdominal: Soft. Non-tender, non-distended, bowel sounds are normal, no masses, organomegaly, or guarding present. , ROM full and no nontender Ext: no edema and no cyanosis, pulses palpable bilaterally .  Neurological: A&O x3 , no focal motor deficit, sensory intact to light touch bilaterally.    Labs on Admission:  No results found for this or any previous visit (from the past 48 hour(s)).  Radiological Exams on Admission: No results found.  Assessment/Plan Active Problems:  End stage renal disease Hypertension   Diabetes mellitus   Hyperlipidemia   Secondary hyperparathyroidism Plan:  Patient will be dialyzed today ,as per Dr Janey Greaser orders   Continue home meds  Will probably be discharged after dialysis if stable.    Time Spent on Admission: Approximately 40 minutes.  Alexandra [name] 04/10/2011, 7:15 PM

## 2011-04-10 NOTE — ED Notes (Signed)
Patient to Hemodialysis 

## 2011-04-10 NOTE — ED Notes (Addendum)
Admit to [facility] per dr Bascom Levels.

## 2011-04-13 ENCOUNTER — Emergency Department (HOSPITAL_COMMUNITY): Admit: 2011-04-13 | Discharge: 2011-04-13 | Disposition: A | Discharge status: Home / Self Care | Payer: Medicare HMO

## 2011-04-13 ENCOUNTER — Observation Stay (HOSPITAL_COMMUNITY)
Admission: EM | Admit: 2011-04-13 | Discharge: 2011-04-14 | Disposition: A | Discharge status: Home / Self Care | Payer: Medicare HMO | Attending: Nephrology | Admitting: Nephrology

## 2011-04-13 ENCOUNTER — Encounter (HOSPITAL_COMMUNITY): Payer: Self-pay | Admitting: *Deleted

## 2011-04-13 DIAGNOSIS — N186 End stage renal disease: Secondary | ICD-10-CM

## 2011-04-13 DIAGNOSIS — E119 Type 2 diabetes mellitus without complications: Secondary | ICD-10-CM | POA: Insufficient documentation

## 2011-04-13 DIAGNOSIS — I120 Hypertensive chronic kidney disease with stage 5 chronic kidney disease or end stage renal disease: Secondary | ICD-10-CM | POA: Insufficient documentation

## 2011-04-13 DIAGNOSIS — E785 Hyperlipidemia, unspecified: Secondary | ICD-10-CM | POA: Insufficient documentation

## 2011-04-13 DIAGNOSIS — Z992 Dependence on renal dialysis: Secondary | ICD-10-CM

## 2011-04-13 LAB — DIFFERENTIAL
Eosinophils Absolute: 0.2 10*3/uL (ref 0.0–0.7)
Lymphocytes Relative: 32 % (ref 12–46)
Lymphs Abs: 2.2 10*3/uL (ref 0.7–4.0)
Monocytes Relative: 7 % (ref 3–12)
Neutrophils Relative %: 58 % (ref 43–77)

## 2011-04-13 LAB — RENAL FUNCTION PANEL
BUN: 82 mg/dL — ABNORMAL HIGH (ref 6–23)
CO2: 25 mEq/L (ref 19–32)
Chloride: 97 mEq/L (ref 96–112)
Glucose, Bld: 164 mg/dL — ABNORMAL HIGH (ref 70–99)
Phosphorus: 2.6 mg/dL (ref 2.3–4.6)
Potassium: 5.4 mEq/L — ABNORMAL HIGH (ref 3.5–5.1)

## 2011-04-13 LAB — CBC
Hemoglobin: 12.5 g/dL (ref 12.0–15.0)
MCH: 29.6 pg (ref 26.0–34.0)
RBC: 4.23 MIL/uL (ref 3.87–5.11)

## 2011-04-13 MED ORDER — PARICALCITOL 5 MCG/ML IV SOLN
INTRAVENOUS | Status: AC
  Administered 2011-04-13: 1 ug via INTRAVENOUS
  Filled 2011-04-13: qty 1

## 2011-04-13 MED ORDER — GENTAMICIN SULFATE 0.1 % EX OINT
TOPICAL_OINTMENT | CUTANEOUS | Status: DC
  Filled 2011-04-13: qty 15

## 2011-04-13 MED ORDER — DARBEPOETIN ALFA-POLYSORBATE 200 MCG/0.4ML IJ SOLN: 200.0000 ug | Freq: Once | INTRAMUSCULAR | Status: DC

## 2011-04-13 MED ORDER — PARICALCITOL 5 MCG/ML IV SOLN
1.0000 ug | Freq: Once | INTRAVENOUS | Status: DC
  Administered 2011-04-13: 1 ug via INTRAVENOUS

## 2011-04-13 MED ORDER — SODIUM POLYSTYRENE SULFONATE 15 GM/60ML PO SUSP: 15.0000 g | Freq: Once | ORAL | Status: DC

## 2011-04-13 MED ORDER — HEPARIN SODIUM (PORCINE) 1000 UNIT/ML DIALYSIS: 20.0000 [IU]/kg | INTRAMUSCULAR | Status: DC | PRN

## 2011-04-13 NOTE — ED Notes (Signed)
Patient is here for dialysis/  Patient last  tx was Saturday.  She denies any active sx.

## 2011-04-13 NOTE — H&P (Signed)
PCP:   [name], MD, MD   Chief Complaint:  Pt is here for dialysis.   HPI: No complaints, just frustrated that she couldn't get something to eat .   Review of Systems:  The patient denies anorexia, fever, weight loss,, vision loss, decreased hearing, hoarseness, chest pain, syncope, dyspnea on exertion, peripheral edema, balance deficits, hemoptysis, abdominal pain, melena, hematochezia, severe indigestion/heartburn, hematuria, incontinence, genital sores, muscle weakness, suspicious skin lesions, transient blindness, difficulty walking, depression, unusual weight change, abnormal bleeding, enlarged lymph nodes, angioedema, and breast masses.  Past Medical History: Past Medical History  Diagnosis Date  . Chronic kidney disease     esrd  . Diabetes mellitus   . Hyperlipidemia   . Hypertension   . Renal disorder   . Dialysis care     tues, thurs, sat  . Gout due to renal impairment    Past Surgical History  Procedure Date  . Carotid endarterectomy 2002    left  . Btl   . Rectal fissurectomy   . Av fistula placement 10/30/2010    right brachiocephalic   . Fistulogram     Medications: Prior to Admission medications   Medication Sig Start Date End Date Taking? Authorizing Provider  allopurinol (ZYLOPRIM) 100 MG tablet Take 100 mg by mouth daily.     Yes Historical Provider, MD  amLODipine (NORVASC) 5 MG tablet Take 5 mg by mouth at bedtime.    Yes Historical Provider, MD  calcium acetate (PHOSLO) 667 MG capsule Take 1,334 mg by mouth 3 (three) times daily with meals.    Yes Historical Provider, MD  Coenzyme Q10 150 MG CAPS Take 300 mg by mouth daily.    Yes Historical Provider, MD  colchicine 0.6 MG tablet Take 0.6 mg by mouth daily as needed. For gout.   Yes Historical Provider, MD  docusate sodium (COLACE) 100 MG capsule Take 200 mg by mouth at bedtime.     Yes Historical Provider, MD  hydrALAZINE (APRESOLINE) 25 MG tablet Take 75 mg by mouth 3 (three) times daily.     Yes Historical Provider, MD  l-methylfolate-B6-B12 (METANX) 3-35-2 MG TABS Take 1 tablet by mouth 2 (two) times daily.    Yes Historical Provider, MD  losartan (COZAAR) 50 MG tablet Take 50 mg by mouth at bedtime.    Yes Historical Provider, MD  metoprolol (TOPROL-XL) 50 MG 24 hr tablet Take 50 mg by mouth at bedtime.    Yes Historical Provider, MD  multivitamin (RENA-VIT) TABS tablet Take 1 tablet by mouth daily.     Yes Historical Provider, MD  repaglinide (PRANDIN) 0.5 MG tablet Take 0.5 mg by mouth 3 (three) times daily before meals.     Yes Historical Provider, MD    Allergies:   Allergies  Allergen Reactions  . Codeine Other (See Comments)    Passes out  . Epinephrine Other (See Comments)    Tachycardia, diaphoresis, syncope  . Percocet (Oxycodone-Acetaminophen) Other (See Comments)    Passes  out    Social History:  reports that she has been smoking Cigarettes.  She has a 50 pack-year smoking history. She has never used smokeless tobacco. She reports that she does not drink alcohol or use illicit drugs.   Family History: Family History  Problem Relation Age of Onset  . COPD Mother   . Kidney disease Mother   . Heart disease Father   . Lymphoma Son     Physical Exam: Ceasar Mons Vitals:   04/13/11 1734 04/13/11  1739 04/13/11 1800 04/13/11 1830  BP: 211/79 223/108 179/68 168/74  Pulse: 61 96 78 85  Temp:      TempSrc:      Resp: 18 18 18 18   Weight:      SpO2:       Constitutional: Vital signs reviewed.  Patient is a well-developed and well-nourished  in no acute distress and cooperative with exam. Alert and oriented x3.  Head: Normocephalic and atraumatic Mouth: no erythema or exudates, MMM Eyes: PERRL, EOMI, conjunctivae normal, No scleral icterus.  Neck: Supple, Trachea midline normal ROM, No JVD, mass, thyromegaly, or carotid bruit present.  Cardiovascular: RRR, S1 normal, S2 normal, no MRG, pulses symmetric and intact bilaterally Pulmonary/Chest: CTAB, no  wheezes, rales, or rhonchi Abdominal: Soft. Non-tender, non-distended, bowel sounds are normal, no masses, organomegaly, or guarding present.  Musculoskeletal: No joint deformities, erythema, or stiffness, ROM full and no nontender Hematology: no cervical, inginal, or axillary adenopathy.  Neurological: A&O x3, Strenght is normal and symmetric bilaterally, cranial nerve II-XII are grossly intact, no focal motor deficit, sensory intact to light touch bilaterally.  Skin: Warm, dry and intact. No rash, cyanosis, or clubbing.       Labs on Admission:   [facility] 04/13/11 1628 04/10/11 1943  NA 138 141  K 5.4* 5.0  CL 97 102  CO2 25 25  GLUCOSE 164* 159*  BUN 82* 76*  CREATININE 9.47* 9.19*  CALCIUM 10.1 9.4  MG -- --  PHOS 2.6 3.3    Basename 04/13/11 1628 04/10/11 1943  AST -- --  ALT -- --  ALKPHOS -- --  BILITOT -- --  PROT -- --  ALBUMIN 3.5 3.3*   No results found for this basename: LIPASE:2,AMYLASE:2 in the last 72 hours  Basename 04/13/11 1628 04/10/11 1942  WBC 7.0 6.9  NEUTROABS 4.0 3.7  HGB 12.5 11.9*  HCT 39.0 36.4  MCV 92.2 92.9  PLT 181 150   No results found for this basename: CKTOTAL:3,CKMB:3,CKMBINDEX:3,TROPONINI:3 in the last 72 hours No results found for this basename: TSH,T4TOTAL,FREET3,T3FREE,THYROIDAB in the last 72 hours No results found for this basename: VITAMINB12:2,FOLATE:2,FERRITIN:2,TIBC:2,IRON:2,RETICCTPCT:2 in the last 72 hours  Radiological Exams on Admission: No results found.  Assessment/Plan Present on Admission:  .End stage renal disease: admit pt for observation.  Undergoing Dialysis as per Dr frazier's orders Restart Home Medications.  Hypertension: BP parameters slightly high. Repeat blood pressure is 168/80MMhg . Pt is asymptomatic. Restart pts blood pressure medications.  Hyperkalemia: pre dialysis K is 5.4. Rechek K level after dialysis and give her a dose of kayexalate.     Time spent on this patient including  examination and decision-making process: 35 minutes.  Jeannelle Wiens [phone] 04/13/2011, 6:49 PM

## 2011-04-13 NOTE — Discharge Summary (Signed)
Admit date: 04/03/2011 Discharge date: 04/13/2011  Primary Care Physician:  [name], MD, MD   Discharge Diagnoses:      Diagnoses Date Noted   . HTN (hypertension) 02/23/2011   . DM (diabetes mellitus) 02/23/2011   . End stage renal disease 12/14/2010              DISCHARGE MEDICATION: Medication List  As of 04/13/2011  7:30 PM   ASK your doctor about these medications         allopurinol 100 MG tablet   Commonly known as: ZYLOPRIM   Take 100 mg by mouth daily.      amLODipine 5 MG tablet   Commonly known as: NORVASC   Take 5 mg by mouth at bedtime.      calcium acetate 667 MG capsule   Commonly known as: PHOSLO   Take 1,334 mg by mouth 3 (three) times daily with meals.      Coenzyme Q10 150 MG Caps   Take 300 mg by mouth daily.      colchicine 0.6 MG tablet   Take 0.6 mg by mouth daily as needed. For gout.      docusate sodium 100 MG capsule   Commonly known as: COLACE   Take 200 mg by mouth at bedtime.      hydrALAZINE 25 MG tablet   Commonly known as: APRESOLINE   Take 75 mg by mouth 3 (three) times daily.      l-methylfolate-B6-B12 3-35-2 MG Tabs   Commonly known as: METANX   Take 1 tablet by mouth 2 (two) times daily.      losartan 50 MG tablet   Commonly known as: COZAAR   Take 50 mg by mouth at bedtime.      metoprolol succinate 50 MG 24 hr tablet   Commonly known as: TOPROL-XL   Take 50 mg by mouth at bedtime.      multivitamin Tabs tablet   Take 1 tablet by mouth daily.      repaglinide 0.5 MG tablet   Commonly known as: PRANDIN   Take 0.5 mg by mouth 3 (three) times daily before meals.               No results found for this or any previous visit (from the past 240 hour(s)).  BRIEF ADMITTING H & P:Patient was admitted for dialysis. Hospital course: After dialysis patient went home.   No resolved problems to display.  Active Hospital Problems  Diagnoses Date Noted   . HTN (hypertension) 02/23/2011   . DM (diabetes  mellitus) 02/23/2011   . End stage renal disease 12/14/2010     Resolved Hospital Problems  Diagnoses Date Noted Date Resolved      Follow-up Information    Follow up with [name], MD .         Blood pressure 152/55, pulse 77, temperature 98.7 F (37.1 C), temperature source Oral, resp. rate 20, weight 59.05 kg (130 lb 2.9 oz), SpO2 93.00%.   Basename 04/13/11 1628 04/10/11 1943  NA 138 141  K 5.4* 5.0  CL 97 102  CO2 25 25  GLUCOSE 164* 159*  BUN 82* 76*  CREATININE 9.47* 9.19*  CALCIUM 10.1 9.4  MG -- --  PHOS 2.6 3.3    Basename 04/13/11 1628 04/10/11 1943  AST -- --  ALT -- --  ALKPHOS -- --  BILITOT -- --  PROT -- --  ALBUMIN 3.5 3.3*   No results found for  this basename: LIPASE:2,AMYLASE:2 in the last 72 hours  Basename 04/13/11 1628 04/10/11 1942  WBC 7.0 6.9  NEUTROABS 4.0 3.7  HGB 12.5 11.9*  HCT 39.0 36.4  MCV 92.2 92.9  PLT 181 150    Signed: Johnice Riebe M.D. 04/13/2011, 7:30 PM

## 2011-04-15 ENCOUNTER — Emergency Department (HOSPITAL_COMMUNITY): Payer: Medicare HMO

## 2011-04-15 ENCOUNTER — Emergency Department (HOSPITAL_COMMUNITY)
Admission: EM | Admit: 2011-04-15 | Discharge: 2011-04-15 | Discharge status: Against Medical Advice | Payer: Medicare HMO | Attending: Internal Medicine | Admitting: Internal Medicine

## 2011-04-15 ENCOUNTER — Encounter (HOSPITAL_COMMUNITY): Payer: Self-pay

## 2011-04-15 DIAGNOSIS — E119 Type 2 diabetes mellitus without complications: Secondary | ICD-10-CM | POA: Insufficient documentation

## 2011-04-15 DIAGNOSIS — D638 Anemia in other chronic diseases classified elsewhere: Secondary | ICD-10-CM | POA: Diagnosis present

## 2011-04-15 DIAGNOSIS — N2581 Secondary hyperparathyroidism of renal origin: Secondary | ICD-10-CM | POA: Diagnosis present

## 2011-04-15 DIAGNOSIS — N189 Chronic kidney disease, unspecified: Secondary | ICD-10-CM | POA: Insufficient documentation

## 2011-04-15 DIAGNOSIS — Z992 Dependence on renal dialysis: Secondary | ICD-10-CM | POA: Insufficient documentation

## 2011-04-15 DIAGNOSIS — E785 Hyperlipidemia, unspecified: Secondary | ICD-10-CM | POA: Insufficient documentation

## 2011-04-15 DIAGNOSIS — I129 Hypertensive chronic kidney disease with stage 1 through stage 4 chronic kidney disease, or unspecified chronic kidney disease: Secondary | ICD-10-CM | POA: Insufficient documentation

## 2011-04-15 LAB — RENAL FUNCTION PANEL
Albumin: 3.4 g/dL — ABNORMAL LOW (ref 3.5–5.2)
BUN: 59 mg/dL — ABNORMAL HIGH (ref 6–23)
CO2: 25 mEq/L (ref 19–32)
Chloride: 101 mEq/L (ref 96–112)
Glucose, Bld: 80 mg/dL (ref 70–99)
Potassium: 5 mEq/L (ref 3.5–5.1)

## 2011-04-15 LAB — CBC
HCT: 35.3 % — ABNORMAL LOW (ref 36.0–46.0)
Hemoglobin: 11.5 g/dL — ABNORMAL LOW (ref 12.0–15.0)
MCV: 91 fL (ref 78.0–100.0)
Platelets: 149 10*3/uL — ABNORMAL LOW (ref 150–400)
RBC: 3.88 MIL/uL (ref 3.87–5.11)
WBC: 6.6 10*3/uL (ref 4.0–10.5)

## 2011-04-15 MED ORDER — ALTEPLASE 2 MG IJ SOLR
2.0000 mg | Freq: Once | INTRAMUSCULAR | Status: DC | PRN
  Filled 2011-04-15: qty 2

## 2011-04-15 MED ORDER — PENTAFLUOROPROP-TETRAFLUOROETH EX AERO
1.0000 "application " | INHALATION_SPRAY | CUTANEOUS | Status: DC | PRN
  Filled 2011-04-15: qty 103.5

## 2011-04-15 MED ORDER — LIDOCAINE HCL (PF) 1 % IJ SOLN: 5.0000 mL | INTRAMUSCULAR | Status: DC | PRN

## 2011-04-15 MED ORDER — HEPARIN SODIUM (PORCINE) 1000 UNIT/ML DIALYSIS
1000.0000 [IU] | INTRAMUSCULAR | Status: DC | PRN
  Administered 2011-04-15: 1000 [IU] via INTRAVENOUS_CENTRAL
  Filled 2011-04-15: qty 1

## 2011-04-15 MED ORDER — SODIUM CHLORIDE 0.9 % IV SOLN: 100.0000 mL | INTRAVENOUS | Status: DC | PRN

## 2011-04-15 MED ORDER — LIDOCAINE-PRILOCAINE 2.5-2.5 % EX CREA: 1.0000 "application " | TOPICAL_CREAM | CUTANEOUS | Status: DC | PRN

## 2011-04-15 MED ORDER — HEPARIN SODIUM (PORCINE) 1000 UNIT/ML DIALYSIS: 40.0000 [IU]/kg | INTRAMUSCULAR | Status: DC | PRN

## 2011-04-15 MED ORDER — NEPRO/CARBSTEADY PO LIQD
237.0000 mL | ORAL | Status: DC | PRN
  Filled 2011-04-15: qty 237

## 2011-04-15 NOTE — H&P (Addendum)
This is an H&P and DC Summary note     Alexandra [name] CSN:[id number],MRN:[id number]  Outpatient Primary MD for the patient is [name], MD, MD  With History of -  Past Medical History  Diagnosis Date  . Chronic kidney disease     esrd  . Diabetes mellitus   . Hyperlipidemia   . Hypertension   . Renal disorder   . Dialysis care     tues, thurs, sat  . Gout due to renal impairment       Past Surgical History  Procedure Date  . Carotid endarterectomy 2002    left  . Btl   . Rectal fissurectomy   . Av fistula placement 10/30/2010    right brachiocephalic   . Fistulogram     in for   Chief Complaint  Patient presents with  . Vascular Access Problem    needs dialysis     HPI  Alexandra [name]  is a 66 y.o. female,  with history of ESRD stage V patient on routine dialysis on Tuesday Thursday Saturday, who follows with Dr. Leretha Dykes for her dialysis, and states that she is not involved with any outpatient dialysis clinics at this time. She says that according to Dr. Leretha Dykes she has been instructed to come to the hospital on Tuesday Thursday Saturday, date admitted to the hospital for dialysis and then be discharged thereafter. Per patient she apparently is on a pre-transplant list at [facility].   Patient in the ER has no subjective complaints whatsoever, Dr Bascom Levels is out of town I have discussed the case with Dr Allena Katz who has graciously agreed to dialyze her.  Of note post dialysis pt routinely signs out AMA and she intends to do it again.     Review of Systems    In addition to the HPI above,   No Fever-chills, No Headache, No changes with Vision or hearing, No problems swallowing food or Liquids, No Chest pain, Cough or Shortness of Breath, No Abdominal pain, No Nausea or Vommitting, Bowel movements are regular, No Blood in stool or Urine, No dysuria, No new  skin rashes or bruises, No new joints pains-aches,  No new weakness, tingling, numbness in any extremity, No recent weight gain or loss, No polyuria, polydypsia or polyphagia, No significant Mental Stressors.  A full 10 point Review of Systems was done, except as stated above, all other Review of Systems were negative.   Social History History  Substance Use Topics  . Smoking status: Current Everyday Smoker -- 1.0 packs/day for 50 years    Types: Cigarettes  . Smokeless tobacco: Never Used  . Alcohol Use: No      Family History Family History  Problem Relation Age of Onset  . COPD Mother   . Kidney disease Mother   . Heart disease Father   . Lymphoma Son       Prior to Admission medications   Medication Sig Start Date End Date Taking? Authorizing Provider  allopurinol (ZYLOPRIM) 100 MG tablet Take 100 mg by mouth daily.     Yes Historical Provider, MD  amLODipine (NORVASC) 5 MG tablet Take 5 mg by mouth at bedtime.    Yes Historical Provider, MD  calcium acetate (PHOSLO) 667 MG capsule Take 1,334 mg by mouth 3 (three) times daily with meals.    Yes Historical Provider, MD  Coenzyme Q10 150 MG CAPS Take 300 mg by mouth daily.    Yes Historical Provider, MD  colchicine 0.6  MG tablet Take 0.6 mg by mouth daily as needed. For gout.   Yes Historical Provider, MD  docusate sodium (COLACE) 100 MG capsule Take 200 mg by mouth at bedtime.     Yes Historical Provider, MD  hydrALAZINE (APRESOLINE) 25 MG tablet Take 75 mg by mouth 3 (three) times daily.    Yes Historical Provider, MD  l-methylfolate-B6-B12 (METANX) 3-35-2 MG TABS Take 1 tablet by mouth 2 (two) times daily.    Yes Historical Provider, MD  losartan (COZAAR) 50 MG tablet Take 50 mg by mouth at bedtime.    Yes Historical Provider, MD  metoprolol (TOPROL-XL) 50 MG 24 hr tablet Take 50 mg by mouth at bedtime.    Yes Historical Provider, MD  multivitamin (RENA-VIT) TABS tablet Take 1 tablet by mouth daily.     Yes Historical  Provider, MD  repaglinide (PRANDIN) 0.5 MG tablet Take 0.5 mg by mouth 3 (three) times daily before meals.     Yes Historical Provider, MD    Allergies  Allergen Reactions  . Codeine Other (See Comments)    Passes out  . Epinephrine Other (See Comments)    Tachycardia, diaphoresis, syncope  . Percocet (Oxycodone-Acetaminophen) Other (See Comments)    Passes  out    Physical Exam  Vitals  Blood pressure 187/68, pulse 82, temperature 98.2 F (36.8 C), temperature source Oral, resp. rate 14, SpO2 96.00%.   1. General middle aged AA female lying in bed in NAD,     2. Normal affect and insight, Not Suicidal or Homicidal, Awake Alert, Oriented *3.  3. No F.N deficits, ALL C.Nerves Intact, Strength 5/5 all 4 extremities, Sensation intact all 4 extremities, Plantars down going.  4. Ears and Eyes appear Normal, Conjunctivae clear, PERRLA. Moist Oral Mucosa.  5. Supple Neck, No JVD, No cervical lymphadenopathy appriciated, No Carotid Bruits.R.Subclavian Cath  6. Symmetrical Chest wall movement, Good air movement bilaterally, CTAB.  7. RRR, No Gallops, Rubs or Murmurs, No Parasternal Heave.  8. Positive Bowel Sounds, Abdomen Soft, Non tender, No organomegaly appriciated,       No rebound -guarding or rigidity.  9.  No Cyanosis, Normal Skin Turgor, No Skin Rash or Bruise.  10. Good muscle tone,  joints appear normal , no effusions, Normal ROM.  11. No Palpable Lymph Nodes in Neck or Axillae     Data Review  CBC  Lab 04/13/11 1628 04/10/11 1942 04/08/11 1903  WBC 7.0 6.9 7.3  HGB 12.5 11.9* 12.7  HCT 39.0 36.4 38.2  PLT 181 150 177  MCV 92.2 92.9 91.6  MCH 29.6 30.4 30.5  MCHC 32.1 32.7 33.2  RDW 17.5* 17.8* 17.9*  LYMPHSABS 2.2 2.4 2.5  MONOABS 0.5 0.6 0.5  EOSABS 0.2 0.2 0.3  BASOSABS 0.0 0.0 0.0  BANDABS -- -- --   ------------------------------------------------------------------------------------------------------------------ Chemistries   Lab 04/13/11  1628 04/10/11 1943 04/08/11 1902  NA 138 141 139  K 5.4* 5.0 5.3*  CL 97 102 96  CO2 25 25 23   GLUCOSE 164* 159* 154*  BUN 82* 76* 111*  CREATININE 9.47* 9.19* 11.84*  CALCIUM 10.1 9.4 10.3  MG -- -- --  AST -- -- --  ALT -- -- --  ALKPHOS -- -- --  BILITOT -- -- --      Assessment & Plan   #1. End stage renal disease - I have discussed the case with Dr. Allena Katz as Dr Bascom Levels is out of town, he will evaluate labs and do the dialysis orders,  thereafter patient most likely will sign out AMA or will be discharged, the plan per Dr. Leretha Dykes is to have patient come to the hospital every Tue ,Thur and saturday be admitted for dialysis and then discharged thereafter.    #2.HTN - we'll continue home medications Hydralazine, metoprolol,Norvasc and ARB with holding parameters.    #3.DM (diabetes mellitus) - home medications with q. a.c. sliding scale    #4. Secondary hyperparathyroidism -Per renal.    #5. History of smoking patient counseled to quit smoking.    Disposition per renal     DVT Prophylaxis Heparin    AM Labs Ordered, also please review Full Orders  Admission, patients condition and plan of care including tests being ordered have been discussed with the patient  who indicates understanding and agree with the plan and Code Status.  Code Status Full  Condition Alexandra [name] K M.D on 04/15/2011 at 3:42 PM  [facility] Office  [phone]

## 2011-04-15 NOTE — ED Notes (Signed)
Pt presents for dialysis. Dr. Bascom Levels to be notified and he needs to be notified to write dialysis orders.

## 2011-04-15 NOTE — Consult Note (Signed)
Renal Consultation Note  Consulted for: Provision of Hemodialysis  Consulted by: Susa Raring MD ([facility])   HPI: 66 yo AAF with h/o ESRD secondary to hypertension. Discharged from her dialysis unit due aggressive/threatening behavior and now presents regularly to the hospital for admission and provision of dialysis under the care of Dr.Frazier- he is currently out of town and we have been asked to assist with provision of dialysis. She denies any complaints and states that she is only here for her routine dialysis.    Past Medical History  Diagnosis Date  . Chronic kidney disease     esrd  . Diabetes mellitus   . Hyperlipidemia   . Hypertension   . Renal disorder   . Dialysis care     tues, thurs, sat  . 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 Carotid endarterectomy 2002    left  . Btl   . Rectal fissurectomy   . Av fistula placement 10/30/2010    right brachiocephalic   . Fistulogram    Medications: Her outpatient list was reviewed  Allergies  Allergen Reactions  . Codeine Other (See Comments)    Passes out  . Epinephrine Other (See Comments)    Tachycardia, diaphoresis, syncope  . Percocet (Oxycodone-Acetaminophen) Other (See Comments)    Passes  out   Review of Systems  Constitutional: Negative.   HENT: Negative.  Negative for hearing loss, ear pain, nosebleeds, congestion, sore throat, tinnitus and ear discharge.   Eyes: Negative.   Respiratory: Negative.  Negative for cough, hemoptysis, sputum production, shortness of breath, wheezing and stridor.   Cardiovascular: Negative.   Gastrointestinal: Negative.  Negative for heartburn, nausea, vomiting, abdominal pain, diarrhea, constipation, blood in stool and melena.  Genitourinary: Negative.   Musculoskeletal: Negative.   Skin: Negative.  Negative for itching and rash.  Neurological: Negative.  Negative for headaches.  Endo/Heme/Allergies: Negative.     Psychiatric/Behavioral: Negative.   All other systems reviewed and are negative.    Physical Exam: BP 187/68  Pulse 82  Temp(Src) 98.2 F (36.8 C) (Oral)  Resp 14  SpO2 96%  Physical Exam  Nursing note reviewed. Constitutional: She is oriented to person, place, and time. She appears well-developed and well-nourished. No distress.  HENT:  Head: Normocephalic.  Right Ear: External ear normal.  Left Ear: External ear normal.  Nose: Nose normal.  Mouth/Throat: No oropharyngeal exudate.  Eyes: Conjunctivae and EOM are normal. Pupils are equal, round, and reactive to light. Right eye exhibits no discharge. Left eye exhibits no discharge. No scleral icterus.  Neck: Normal range of motion. Neck supple. No JVD present. No tracheal deviation present. No thyromegaly present.  Cardiovascular: Normal rate and regular rhythm.  Exam reveals no gallop and no friction rub.   No murmur heard. Pulmonary/Chest: Effort normal and breath sounds normal. No stridor. No respiratory distress. She has no wheezes. She has no rales. She exhibits no tenderness.  Abdominal: Soft. Bowel sounds are normal. She exhibits no distension and no mass. There is no tenderness. There is no rebound and no guarding.  Musculoskeletal: Normal range of motion.  Lymphadenopathy:    She has no cervical adenopathy.  Neurological: She is alert and oriented to person, place, and time.  Skin: Skin is warm. No rash noted. She is not diaphoretic. No erythema. No pallor.  Psychiatric: She has a normal mood and affect. Thought content normal.   BMET    Component Value  Date/Time   NA 138 04/13/2011 1628   K 5.4* 04/13/2011 1628   CL 97 04/13/2011 1628   CO2 25 04/13/2011 1628   GLUCOSE 164* 04/13/2011 1628   BUN 82* 04/13/2011 1628   CREATININE 9.47* 04/13/2011 1628   CALCIUM 10.1 04/13/2011 1628   CALCIUM 7.2* 08/22/2009 0440   GFRNONAA 4* 04/13/2011 1628   GFRAA 4* 04/13/2011 1628   CBC    Component Value Date/Time   WBC 7.0  04/13/2011 1628   RBC 4.23 04/13/2011 1628   HGB 12.5 04/13/2011 1628   HCT 39.0 04/13/2011 1628   PLT 181 04/13/2011 1628   MCV 92.2 04/13/2011 1628   MCH 29.6 04/13/2011 1628   MCHC 32.1 04/13/2011 1628   RDW 17.5* 04/13/2011 1628   LYMPHSABS 2.2 04/13/2011 1628   MONOABS 0.5 04/13/2011 1628   EOSABS 0.2 04/13/2011 1628   BASOSABS 0.0 04/13/2011 1628     Assessment/Plan: 1. Hyperkalemia: Due to ESRD- plan for hemodialysis today. 2. Hypertension: Chronic and likely compounded by ESRD- plan for UF on dialysis today. 3. ESRD: HD today 4. Anemia: hemoglobin at goal off ESA 5. 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 Office # [phone] Pager # [phone] 4:18 PM

## 2011-04-15 NOTE — ED Notes (Signed)
Patient states here for her regular dialysis. Dialysis is Tuesdays Thursdays and Saturdays.  Patient has no complaints sitting in a chair reading the paper.

## 2011-04-15 NOTE — ED Notes (Signed)
[id number] Ready

## 2011-04-15 NOTE — ED Notes (Signed)
Meal tray ordered at this time. Requested that tray be delivered to dialysis

## 2011-04-15 NOTE — Discharge Summary (Signed)
Please see the H&P note below, patient who gets admitted to the hospital 3 times a week for dialysis due to her end-stage renal disease thereafter signed out AMA, had the same admission and discharge again. Was seen by Dr. Allena Katz and dialyzed as Dr. Leretha Dykes was out of town. Patient will follow with Dr. Leretha Dykes outpatient again as before. See my H&P note from this morning below.                                                                             This is an H&P and DC Summary note      Alexandra [name] CSN:[id number],MRN:[id number]  Outpatient Primary MD for the patient is [name], MD, MD  With History of -  Past Medical History   Diagnosis  Date   .  Chronic kidney disease      esrd   .  Diabetes mellitus    .  Hyperlipidemia    .  Hypertension    .  Renal disorder    .  Dialysis care      tues, thurs, sat   .  Gout due to renal impairment     Past Surgical History   Procedure  Date   .  Carotid endarterectomy  2002     left   .  Btl    .  Rectal fissurectomy    .  Av fistula placement  10/30/2010     right brachiocephalic   .  Fistulogram     in for  Chief Complaint   Patient presents with   .  Vascular Access Problem     needs dialysis    HPI  Alexandra [name] is a 66 y.o. female, with history of ESRD stage V patient on routine dialysis on Tuesday Thursday Saturday, who follows with Dr. Leretha Dykes for her dialysis, and states that she is not involved with any outpatient dialysis clinics at this time. She says that according to Dr. Leretha Dykes she has been instructed to come to the hospital on Tuesday Thursday Saturday, date admitted to the hospital for dialysis and then be discharged thereafter. Per patient she apparently is on a pre-transplant list at [facility].  Patient in the ER has no subjective complaints whatsoever, Dr Bascom Levels is out of town I have discussed the case with Dr Allena Katz who has graciously agreed to dialyze her.  Of note post dialysis pt  routinely signs out AMA and she intends to do it again.  Review of Systems  In addition to the HPI above,  No Fever-chills,  No Headache, No changes with Vision or hearing,  No problems swallowing food or Liquids,  No Chest pain, Cough or Shortness of Breath,  No Abdominal pain, No Nausea or Vommitting, Bowel movements are regular,  No Blood in stool or Urine,  No dysuria,  No new skin rashes or bruises,  No new joints pains-aches,  No new weakness, tingling, numbness in any extremity,  No recent weight gain or loss,  No polyuria, polydypsia or polyphagia,  No significant Mental Stressors.  A full 10 point Review of Systems was done, except as stated above, all other Review of Systems were negative.  Social  History  History   Substance Use Topics   .  Smoking status:  Current Everyday Smoker -- 1.0 packs/day for 50 years     Types:  Cigarettes   .  Smokeless tobacco:  Never Used   .  Alcohol Use:  No    Family History  Family History   Problem  Relation  Age of Onset   .  COPD  Mother    .  Kidney disease  Mother    .  Heart disease  Father    .  Lymphoma  Son     Prior to Admission medications   Medication  Sig  Start Date  End Date  Taking?  Authorizing Provider   allopurinol (ZYLOPRIM) 100 MG tablet  Take 100 mg by mouth daily.    Yes  Historical Provider, MD   amLODipine (NORVASC) 5 MG tablet  Take 5 mg by mouth at bedtime.    Yes  Historical Provider, MD   calcium acetate (PHOSLO) 667 MG capsule  Take 1,334 mg by mouth 3 (three) times daily with meals.    Yes  Historical Provider, MD   Coenzyme Q10 150 MG CAPS  Take 300 mg by mouth daily.    Yes  Historical Provider, MD   colchicine 0.6 MG tablet  Take 0.6 mg by mouth daily as needed. For gout.    Yes  Historical Provider, MD   docusate sodium (COLACE) 100 MG capsule  Take 200 mg by mouth at bedtime.    Yes  Historical Provider, MD   hydrALAZINE (APRESOLINE) 25 MG tablet  Take 75 mg by mouth 3 (three) times daily.    Yes   Historical Provider, MD   l-methylfolate-B6-B12 (METANX) 3-35-2 MG TABS  Take 1 tablet by mouth 2 (two) times daily.    Yes  Historical Provider, MD   losartan (COZAAR) 50 MG tablet  Take 50 mg by mouth at bedtime.    Yes  Historical Provider, MD   metoprolol (TOPROL-XL) 50 MG 24 hr tablet  Take 50 mg by mouth at bedtime.    Yes  Historical Provider, MD   multivitamin (RENA-VIT) TABS tablet  Take 1 tablet by mouth daily.    Yes  Historical Provider, MD   repaglinide (PRANDIN) 0.5 MG tablet  Take 0.5 mg by mouth 3 (three) times daily before meals.    Yes  Historical Provider, MD    Allergies   Allergen  Reactions   .  Codeine  Other (See Comments)     Passes out   .  Epinephrine  Other (See Comments)     Tachycardia, diaphoresis, syncope   .  Percocet (Oxycodone-Acetaminophen)  Other (See Comments)     Passes out    Physical Exam  Vitals  Blood pressure 187/68, pulse 82, temperature 98.2 F (36.8 C), temperature source Oral, resp. rate 14, SpO2 96.00%.  1. General middle aged AA female lying in bed in NAD,  2. Normal affect and insight, Not Suicidal or Homicidal, Awake Alert, Oriented *3.  3. No F.N deficits, ALL C.Nerves Intact, Strength 5/5 all 4 extremities, Sensation intact all 4 extremities, Plantars down going.  4. Ears and Eyes appear Normal, Conjunctivae clear, PERRLA. Moist Oral Mucosa.  5. Supple Neck, No JVD, No cervical lymphadenopathy appriciated, No Carotid Bruits.R.Subclavian Cath  6. Symmetrical Chest wall movement, Good air movement bilaterally, CTAB.  7. RRR, No Gallops, Rubs or Murmurs, No Parasternal Heave.  8. Positive Bowel Sounds, Abdomen Soft, Non tender,  No organomegaly appriciated,  No rebound -guarding or rigidity.  9. No Cyanosis, Normal Skin Turgor, No Skin Rash or Bruise.  10. Good muscle tone, joints appear normal , no effusions, Normal ROM.  11. No Palpable Lymph Nodes in Neck or Axillae  Data Review  CBC   Lab  04/13/11 1628  04/10/11 1942  04/08/11  1903   WBC  7.0  6.9  7.3   HGB  12.5  11.9*  12.7   HCT  39.0  36.4  38.2   PLT  181  150  177   MCV  92.2  92.9  91.6   MCH  29.6  30.4  30.5   MCHC  32.1  32.7  33.2   RDW  17.5*  17.8*  17.9*   LYMPHSABS  2.2  2.4  2.5   MONOABS  0.5  0.6  0.5   EOSABS  0.2  0.2  0.3   BASOSABS  0.0  0.0  0.0   BANDABS  --  --  --    ------------------------------------------------------------------------------------------------------------------  Chemistries   Lab  04/13/11 1628  04/10/11 1943  04/08/11 1902   NA  138  141  139   K  5.4*  5.0  5.3*   CL  97  102  96   CO2  25  25  23    GLUCOSE  164*  159*  154*   BUN  82*  76*  111*   CREATININE  9.47*  9.19*  11.84*   CALCIUM  10.1  9.4  10.3   MG  --  --  --   AST  --  --  --   ALT  --  --  --   ALKPHOS  --  --  --   BILITOT  --  --  --    Assessment & Plan  #1. End stage renal disease - I have discussed the case with Dr. Allena Katz as Dr Bascom Levels is out of town, he will evaluate labs and do the dialysis orders, thereafter patient most likely will sign out AMA or will be discharged, the plan per Dr. Leretha Dykes is to have patient come to the hospital every Tue ,Thur and saturday be admitted for dialysis and then discharged thereafter.  #2.HTN - we'll continue home medications Hydralazine, metoprolol,Norvasc and ARB with holding parameters.  #3.DM (diabetes mellitus) - home medications with q. a.c. sliding scale  #4. Secondary hyperparathyroidism -Per renal.  #5. History of smoking patient counseled to quit smoking.  Disposition per renal  DVT Prophylaxis Heparin  AM Labs Ordered, also please review Full Orders  Admission, patients condition and plan of care including tests being ordered have been discussed with the patient who indicates understanding and agree with the plan and Code Status.  Code Status Full  Condition Alexandra [name] K M.D on 04/15/2011 at 3:42 PM  [facility]  Office [phone]

## 2011-04-15 NOTE — ED Provider Notes (Signed)
MSE  Patient presents here today for her scheduled hemodialysis. She is known to Dr. Leretha Dykes. She last dialyzed 2 days ago on Tuesday. Dr. Leretha Dykes was contacted, and Dr. Chestine Spore, answered. He requested that we call the [facility] to have the patient admitted. She has no complaints   Mmm  Lungs cta   rrr   Peripheral pulses intact   Awake and alert    [facility] to arrange HD.   Brendyn Mclaren A. Patrica Duel, MD 04/15/11 1457

## 2011-04-15 NOTE — ED Notes (Signed)
Paged [facility] to [phone] 

## 2011-04-17 ENCOUNTER — Encounter (HOSPITAL_COMMUNITY): Payer: Self-pay | Admitting: Emergency Medicine

## 2011-04-17 ENCOUNTER — Observation Stay (HOSPITAL_COMMUNITY)
Admission: EM | Admit: 2011-04-17 | Discharge: 2011-04-17 | Disposition: A | Discharge status: Home / Self Care | Payer: Medicare HMO | Attending: Internal Medicine | Admitting: Internal Medicine

## 2011-04-17 ENCOUNTER — Emergency Department (HOSPITAL_COMMUNITY): Admit: 2011-04-17 | Discharge: 2011-04-17 | Disposition: A | Discharge status: Home / Self Care | Payer: Medicare HMO

## 2011-04-17 DIAGNOSIS — N186 End stage renal disease: Secondary | ICD-10-CM | POA: Insufficient documentation

## 2011-04-17 DIAGNOSIS — M109 Gout, unspecified: Secondary | ICD-10-CM | POA: Insufficient documentation

## 2011-04-17 DIAGNOSIS — N19 Unspecified kidney failure: Secondary | ICD-10-CM

## 2011-04-17 DIAGNOSIS — I120 Hypertensive chronic kidney disease with stage 5 chronic kidney disease or end stage renal disease: Secondary | ICD-10-CM | POA: Insufficient documentation

## 2011-04-17 DIAGNOSIS — Z992 Dependence on renal dialysis: Principal | ICD-10-CM | POA: Insufficient documentation

## 2011-04-17 DIAGNOSIS — E785 Hyperlipidemia, unspecified: Secondary | ICD-10-CM | POA: Insufficient documentation

## 2011-04-17 DIAGNOSIS — E119 Type 2 diabetes mellitus without complications: Secondary | ICD-10-CM | POA: Insufficient documentation

## 2011-04-17 MED ORDER — ONDANSETRON HCL 4 MG/2ML IJ SOLN: 4.0000 mg | Freq: Four times a day (QID) | INTRAMUSCULAR | Status: DC | PRN

## 2011-04-17 MED ORDER — ALTEPLASE 2 MG IJ SOLR: 2.0000 mg | Freq: Once | INTRAMUSCULAR | Status: DC | PRN

## 2011-04-17 MED ORDER — HEPARIN SODIUM (PORCINE) 1000 UNIT/ML DIALYSIS: 1000.0000 [IU] | INTRAMUSCULAR | Status: DC | PRN

## 2011-04-17 MED ORDER — ACETAMINOPHEN 650 MG RE SUPP: 650.0000 mg | Freq: Four times a day (QID) | RECTAL | Status: DC | PRN

## 2011-04-17 MED ORDER — SODIUM CHLORIDE 0.9 % IV SOLN: 100.0000 mL | INTRAVENOUS | Status: DC | PRN

## 2011-04-17 MED ORDER — LIDOCAINE HCL (PF) 1 % IJ SOLN: 5.0000 mL | INTRAMUSCULAR | Status: DC | PRN

## 2011-04-17 MED ORDER — HEPARIN SODIUM (PORCINE) 1000 UNIT/ML DIALYSIS
20.0000 [IU]/kg | INTRAMUSCULAR | Status: DC | PRN
  Administered 2011-04-17: 1200 [IU] via INTRAVENOUS_CENTRAL

## 2011-04-17 MED ORDER — ONDANSETRON HCL 4 MG PO TABS: 4.0000 mg | ORAL_TABLET | Freq: Four times a day (QID) | ORAL | Status: DC | PRN

## 2011-04-17 MED ORDER — DOCUSATE SODIUM 283 MG RE ENEM: 1.0000 | ENEMA | RECTAL | Status: DC | PRN

## 2011-04-17 MED ORDER — ZOLPIDEM TARTRATE 5 MG PO TABS: 5.0000 mg | ORAL_TABLET | Freq: Every evening | ORAL | Status: DC | PRN

## 2011-04-17 MED ORDER — LIDOCAINE-PRILOCAINE 2.5-2.5 % EX CREA: 1.0000 "application " | TOPICAL_CREAM | CUTANEOUS | Status: DC | PRN

## 2011-04-17 MED ORDER — NEPRO/CARBSTEADY PO LIQD: 237.0000 mL | Freq: Three times a day (TID) | ORAL | Status: DC | PRN

## 2011-04-17 MED ORDER — PENTAFLUOROPROP-TETRAFLUOROETH EX AERO: 1.0000 "application " | INHALATION_SPRAY | CUTANEOUS | Status: DC | PRN

## 2011-04-17 MED ORDER — HYDROXYZINE HCL 25 MG PO TABS: 25.0000 mg | ORAL_TABLET | Freq: Three times a day (TID) | ORAL | Status: DC | PRN

## 2011-04-17 MED ORDER — ACETAMINOPHEN 325 MG PO TABS: 650.0000 mg | ORAL_TABLET | Freq: Four times a day (QID) | ORAL | Status: DC | PRN

## 2011-04-17 MED ORDER — SORBITOL 70 % SOLN: 30.0000 mL | Status: DC | PRN

## 2011-04-17 MED ORDER — CAMPHOR-MENTHOL 0.5-0.5 % EX LOTN: 1.0000 "application " | TOPICAL_LOTION | Freq: Three times a day (TID) | CUTANEOUS | Status: DC | PRN

## 2011-04-17 MED ORDER — CALCIUM CARBONATE 1250 MG/5ML PO SUSP: 500.0000 mg | Freq: Four times a day (QID) | ORAL | Status: DC | PRN

## 2011-04-17 NOTE — ED Notes (Signed)
Diet tray ordered renal 60/70

## 2011-04-17 NOTE — H&P (Signed)
PCP:   [name], MD, MD   Chief Complaint:  Dialysis  HPI: 66 year old female who comes to the hospital 3 times a week to get hemodialysis for the end stage renal disease. She denies any complaints today no fever no chest pain no shortness of breath no nausea vomiting or diarrhea.  Allergies:   Allergies  Allergen Reactions  . Codeine Other (See Comments)    Passes out  . Epinephrine Other (See Comments)    Tachycardia, diaphoresis, syncope  . Percocet (Oxycodone-Acetaminophen) Other (See Comments)    Passes  out      Past Medical History  Diagnosis Date  . Chronic kidney disease     esrd  . Diabetes mellitus   . Hyperlipidemia   . Hypertension   . Renal disorder   . Dialysis care     tues, thurs, sat  . 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 Carotid endarterectomy 2002    left  . Btl   . Rectal fissurectomy   . Av fistula placement 10/30/2010    right brachiocephalic   . Fistulogram     Prior to Admission medications   Medication Sig Start Date End Date Taking? Authorizing Provider  allopurinol (ZYLOPRIM) 100 MG tablet Take 100 mg by mouth daily.     Yes Historical Provider, MD  amLODipine (NORVASC) 5 MG tablet Take 5 mg by mouth at bedtime.    Yes Historical Provider, MD  calcium acetate (PHOSLO) 667 MG capsule Take 1,334 mg by mouth 3 (three) times daily with meals.    Yes Historical Provider, MD  Coenzyme Q10 150 MG CAPS Take 300 mg by mouth daily.    Yes Historical Provider, MD  colchicine 0.6 MG tablet Take 0.6 mg by mouth daily as needed. For gout.   Yes Historical Provider, MD  docusate sodium (COLACE) 100 MG capsule Take 200 mg by mouth at bedtime.     Yes Historical Provider, MD  hydrALAZINE (APRESOLINE) 25 MG tablet Take 75 mg by mouth 3 (three) times daily.    Yes Historical Provider, MD  l-methylfolate-B6-B12 (METANX) 3-35-2 MG TABS Take 1 tablet by mouth 2 (two) times daily.    Yes Historical Provider, MD    losartan (COZAAR) 50 MG tablet Take 50 mg by mouth at bedtime.    Yes Historical Provider, MD  metoprolol (TOPROL-XL) 50 MG 24 hr tablet Take 50 mg by mouth at bedtime.    Yes Historical Provider, MD  repaglinide (PRANDIN) 0.5 MG tablet Take 0.5 mg by mouth 3 (three) times daily before meals.     Yes Historical Provider, MD  multivitamin (RENA-VIT) TABS tablet Take 1 tablet by mouth daily.      Historical Provider, MD    Social History:  reports that she has been smoking Cigarettes.  She has a 50 pack-year smoking history. She has never used smokeless tobacco. She reports that she does not drink alcohol or use illicit drugs.  Family History  Problem Relation Age of Onset  . COPD Mother   . Kidney disease Mother   . Heart disease Father   . Lymphoma Son     Review of Systems:  HEENT: Denies headache, blurred vision, runny nose, sore throat,  Neck: Denies thyroid problems,lymphadenopathy Chest : Denies shortness of breath, no COPD Heart : Denies Chest pain,  coronary arterey disease GI: Denies  nausea, vomiting, diarrhea, constipation GU: Denies dysuria, urgency, frequency of urination, hematuria Neuro:  Denies stroke, seizures, syncope Psych: Denies depression, anxiety, hallucinations   Physical Exam: Blood pressure 183/65, pulse 85, temperature 97.6 F (36.4 C), temperature source Oral, resp. rate 17, SpO2 100.00%. Constitutional: Vital signs reviewed.  Patient is a well-developed and well-nourished female in no acute distress and cooperative with exam. Head: Normocephalic and atraumatic Mouth: Mucus membranes moist Eyes: PERRL, EOMI, conjunctivae normal Neck: Supple, No Thyromegaly Cardiovascular: RRR, S1 normal, S2 normal Pulmonary/Chest: CTAB, no wheezes, rales, or rhonchi Abdominal: Soft. Non-tender, non-distended, bowel sounds are normal, no masses, organomegaly, or guarding present.  Neurological: A&O x3, Strenght is normal and symmetric bilaterally, cranial nerve II-XII  are grossly intact, no focal motor deficit, sensory intact to light touch bilaterally.  Extremities : No Cyanosis, Clubbing or Edema   Labs on Admission:  Results for orders placed during the hospital encounter of 04/15/11 (from the past 48 hour(s))  GLUCOSE, CAPILLARY     Status: Abnormal   Collection Time   04/15/11  7:13 PM      Component Value Range Comment   Glucose-Capillary 166 (*) 70 - 99 (mg/dL)   RENAL FUNCTION PANEL     Status: Abnormal   Collection Time   04/15/11  7:44 PM      Component Value Range Comment   Sodium 137  135 - 145 (mEq/L)    Potassium 5.0  3.5 - 5.1 (mEq/L)    Chloride 101  96 - 112 (mEq/L)    CO2 25  19 - 32 (mEq/L)    Glucose, Bld 80  70 - 99 (mg/dL)    BUN 59 (*) 6 - 23 (mg/dL)    Creatinine, Ser 8.41 (*) 0.50 - 1.10 (mg/dL)    Calcium 9.6  8.4 - 10.5 (mg/dL)    Phosphorus 2.9  2.3 - 4.6 (mg/dL)    Albumin 3.4 (*) 3.5 - 5.2 (g/dL)    GFR calc non Af Amer 5 (*) >90 (mL/min)    GFR calc Af Amer 6 (*) >90 (mL/min)   CBC     Status: Abnormal   Collection Time   04/15/11  7:44 PM      Component Value Range Comment   WBC 6.6  4.0 - 10.5 (K/uL)    RBC 3.88  3.87 - 5.11 (MIL/uL)    Hemoglobin 11.5 (*) 12.0 - 15.0 (g/dL)    HCT 32.4 (*) 40.1 - 46.0 (%)    MCV 91.0  78.0 - 100.0 (fL)    MCH 29.6  26.0 - 34.0 (pg)    MCHC 32.6  30.0 - 36.0 (g/dL)    RDW 02.7 (*) 25.3 - 15.5 (%)    Platelets 149 (*) 150 - 400 (K/uL)    Assessment/Plan  End-stage renal disease Patient will be admitted for her dialysis, will consult nephrology service.  Hypertension Will continue metoprolol,  Hydralazine, Norvasc.  Diabetes mellitus Continue Prandin, sliding scale insulin  Gout Continue when necessary colchicine  DVT prophylaxis SCD's  Time Spent on Admission: 45 min  LAMA,GAGAN S [facility] Pager: [phone] 04/17/2011, 3:35 PM

## 2011-04-17 NOTE — ED Provider Notes (Signed)
History     CSN: [id number]  Arrival date & time 04/17/11  1434   First MD Initiated Contact with Patient 04/17/11 1455      Chief Complaint  Patient presents with  . Vascular Access Problem     HPI  onset - today Course - stable Improved by - nothing Worsened by - nothing  Pt here for dialysis, this is her scheduled day for dialysis She denies cp/sob/weakness She has no complaints   Past Medical History  Diagnosis Date  . Chronic kidney disease     esrd  . Diabetes mellitus   . Hyperlipidemia   . Hypertension   . Renal disorder   . Dialysis care     tues, thurs, sat  . 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 Carotid endarterectomy 2002    left  . Btl   . Rectal fissurectomy   . Av fistula placement 10/30/2010    right brachiocephalic   . Fistulogram     Family History  Problem Relation Age of Onset  . COPD Mother   . Kidney disease Mother   . Heart disease Father   . Lymphoma Son     History  Substance Use Topics  . Smoking status: Current Everyday Smoker -- 1.0 packs/day for 50 years    Types: Cigarettes  . Smokeless tobacco: Never Used  . Alcohol Use: No    OB History    Grav Para Term Preterm Abortions TAB SAB Ect Mult Living                  Review of Systems  Constitutional: Negative for fever.  Respiratory: Negative for shortness of breath.     Allergies  Codeine; Epinephrine; and Percocet  Home Medications   Current Outpatient Rx  Name Route Sig Dispense Refill  . ALLOPURINOL 100 MG PO TABS Oral Take 100 mg by mouth daily.      Marland Kitchen AMLODIPINE BESYLATE 5 MG PO TABS Oral Take 5 mg by mouth at bedtime.     Marland Kitchen CALCIUM ACETATE 667 MG PO CAPS Oral Take 1,334 mg by mouth 3 (three) times daily with meals.     Marland Kitchen COENZYME Q10 150 MG PO CAPS Oral Take 300 mg by mouth daily.     . COLCHICINE 0.6 MG PO TABS Oral Take 0.6 mg by mouth daily as needed. For gout.    Marland Kitchen DOCUSATE SODIUM 100 MG PO CAPS Oral Take  200 mg by mouth at bedtime.      Marland Kitchen HYDRALAZINE HCL 25 MG PO TABS Oral Take 75 mg by mouth 3 (three) times daily.     . L-METHYLFOLATE-B6-B12 3-35-2 MG PO TABS Oral Take 1 tablet by mouth 2 (two) times daily.     Marland Kitchen LOSARTAN POTASSIUM 50 MG PO TABS Oral Take 50 mg by mouth at bedtime.     Marland Kitchen METOPROLOL SUCCINATE ER 50 MG PO TB24 Oral Take 50 mg by mouth at bedtime.     Marland Kitchen REPAGLINIDE 0.5 MG PO TABS Oral Take 0.5 mg by mouth 3 (three) times daily before meals.      Marland Kitchen RENA-VITE PO TABS Oral Take 1 tablet by mouth daily.        BP 183/65  Pulse 85  Temp(Src) 97.6 F (36.4 C) (Oral)  Resp 17  SpO2 100%  Physical Exam CONSTITUTIONAL: Well developed/well nourished HEAD AND FACE: Normocephalic/atraumatic EYES: EOMI/PERRL ENMT: Mucous membranes moist NECK: supple  no meningeal signs Chest - catheter in place, no tenderness/drainage noted CV:  no murmurs/rubs/gallops noted LUNGS: Lungs are clear to auscultation bilaterally, no apparent distress ABDOMEN: soft, nontender, no rebound or guarding NEURO: Pt is awake/alert, moves all extremitiesx4, pt is reading newspaper EXTREMITIES:  full ROM SKIN: warm, color normal PSYCH: no abnormalities of mood noted  ED Course  Procedures   1. Renal failure    3:02 PM Call placed to [facility] for admission/dialysis Pt stable at this time  MDM  Nursing notes reviewed and considered in documentation Previous records reviewed and considered          Joya Gaskins, MD 04/17/11 1502

## 2011-04-17 NOTE — ED Notes (Signed)
Here for inpatient dialysis

## 2011-04-19 NOTE — Progress Notes (Signed)
Utilization Review Completed.[place] Route T2/06/2011   

## 2011-04-20 ENCOUNTER — Observation Stay (HOSPITAL_COMMUNITY)
Admission: EM | Admit: 2011-04-20 | Discharge: 2011-04-20 | DRG: 682 | Disposition: A | Discharge status: Home / Self Care | Payer: Medicare HMO | Attending: Internal Medicine | Admitting: Internal Medicine

## 2011-04-20 ENCOUNTER — Inpatient Hospital Stay (HOSPITAL_COMMUNITY)
Admission: RE | Admit: 2011-04-20 | Discharge: 2011-04-20 | Disposition: A | Discharge status: Home / Self Care | Payer: Medicare HMO | Source: Ambulatory Visit

## 2011-04-20 ENCOUNTER — Encounter (HOSPITAL_COMMUNITY): Payer: Self-pay | Admitting: *Deleted

## 2011-04-20 DIAGNOSIS — Z992 Dependence on renal dialysis: Secondary | ICD-10-CM | POA: Insufficient documentation

## 2011-04-20 DIAGNOSIS — M109 Gout, unspecified: Secondary | ICD-10-CM | POA: Insufficient documentation

## 2011-04-20 DIAGNOSIS — I120 Hypertensive chronic kidney disease with stage 5 chronic kidney disease or end stage renal disease: Secondary | ICD-10-CM | POA: Insufficient documentation

## 2011-04-20 DIAGNOSIS — E119 Type 2 diabetes mellitus without complications: Secondary | ICD-10-CM | POA: Insufficient documentation

## 2011-04-20 DIAGNOSIS — N186 End stage renal disease: Secondary | ICD-10-CM | POA: Insufficient documentation

## 2011-04-20 DIAGNOSIS — E785 Hyperlipidemia, unspecified: Secondary | ICD-10-CM | POA: Insufficient documentation

## 2011-04-20 LAB — RENAL FUNCTION PANEL
Calcium: 10.2 mg/dL (ref 8.4–10.5)
Creatinine, Ser: 8.97 mg/dL — ABNORMAL HIGH (ref 0.50–1.10)
GFR calc Af Amer: 5 mL/min — ABNORMAL LOW (ref >90)
GFR calc non Af Amer: 4 mL/min — ABNORMAL LOW (ref >90)
Glucose, Bld: 65 mg/dL — ABNORMAL LOW (ref 70–99)
Phosphorus: 2.6 mg/dL (ref 2.3–4.6)
Sodium: 139 mEq/L (ref 135–145)

## 2011-04-20 LAB — CBC
HCT: 36.3 % (ref 36.0–46.0)
MCH: 29.4 pg (ref 26.0–34.0)
MCHC: 32.5 g/dL (ref 30.0–36.0)
RDW: 17 % — ABNORMAL HIGH (ref 11.5–15.5)

## 2011-04-20 MED ORDER — HEPARIN SODIUM (PORCINE) 1000 UNIT/ML DIALYSIS
20.0000 [IU]/kg | INTRAMUSCULAR | Status: DC | PRN
  Administered 2011-04-20: 1000 [IU] via INTRAVENOUS_CENTRAL

## 2011-04-20 NOTE — ED Notes (Signed)
Pt is here for dialysis and no other problems

## 2011-04-20 NOTE — H&P (Signed)
PCP:   [name], MD, MD   Chief Complaint:  Dialysis   HPI: 66 year old female who again comes to the hospital for hemodialysis, she gets hd 3  times a week as outpatient. She denies any complaints today no fever no chest pain no shortness of breath no nausea vomiting or diarrhea.   Allergies:   Allergies  Allergen Reactions  . Codeine Other (See Comments)    Passes out  . Epinephrine Other (See Comments)    Tachycardia, diaphoresis, syncope  . Percocet (Oxycodone-Acetaminophen) Other (See Comments)    Passes  out      Past Medical History  Diagnosis Date  . Chronic kidney disease     esrd  . Diabetes mellitus   . Hyperlipidemia   . Hypertension   . Renal disorder   . Dialysis care     tues, thurs, sat  . 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 Carotid endarterectomy 2002    left  . Btl   . Rectal fissurectomy   . Av fistula placement 10/30/2010    right brachiocephalic   . Fistulogram     Prior to Admission medications   Medication Sig Start Date End Date Taking? Authorizing Provider  allopurinol (ZYLOPRIM) 100 MG tablet Take 100 mg by mouth daily.     Yes Historical Provider, MD  amLODipine (NORVASC) 5 MG tablet Take 5 mg by mouth at bedtime.    Yes Historical Provider, MD  calcium acetate (PHOSLO) 667 MG capsule Take 1,334 mg by mouth 3 (three) times daily with meals.    Yes Historical Provider, MD  Coenzyme Q10 150 MG CAPS Take 300 mg by mouth daily.    Yes Historical Provider, MD  colchicine 0.6 MG tablet Take 0.6 mg by mouth daily as needed. For gout.   Yes Historical Provider, MD  docusate sodium (COLACE) 100 MG capsule Take 200 mg by mouth at bedtime.     Yes Historical Provider, MD  hydrALAZINE (APRESOLINE) 25 MG tablet Take 75 mg by mouth 3 (three) times daily.    Yes Historical Provider, MD  l-methylfolate-B6-B12 (METANX) 3-35-2 MG TABS Take 1 tablet by mouth 2 (two) times daily.    Yes Historical Provider,  MD  losartan (COZAAR) 50 MG tablet Take 50 mg by mouth at bedtime.    Yes Historical Provider, MD  metoprolol (TOPROL-XL) 50 MG 24 hr tablet Take 50 mg by mouth at bedtime.    Yes Historical Provider, MD  multivitamin (RENA-VIT) TABS tablet Take 1 tablet by mouth daily.     Yes Historical Provider, MD  repaglinide (PRANDIN) 0.5 MG tablet Take 0.5 mg by mouth 3 (three) times daily before meals.     Yes Historical Provider, MD    Social History:  reports that she has been smoking Cigarettes.  She has a 50 pack-year smoking history. She has never used smokeless tobacco. She reports that she does not drink alcohol or use illicit drugs.  Family History  Problem Relation Age of Onset  . COPD Mother   . Kidney disease Mother   . Heart disease Father   . Lymphoma Son     Review of Systems:  HEENT: Denies headache, blurred vision, runny nose, sore throat,  Neck: Denies thyroid problems,lymphadenopathy Chest : Denies shortness of breath, no history of COPD Heart : Denies Chest pain,  coronary arterey disease GI: Denies  nausea, vomiting, diarrhea, constipation GU: Denies dysuria, urgency, frequency of  urination, hematuria Neuro: Denies stroke, seizures, syncope Psych: Denies depression, anxiety, hallucinations   Physical Exam: Blood pressure 189/62, pulse 83, temperature 97.4 F (36.3 C), temperature source Oral, resp. rate 14, SpO2 95.00%. Constitutional:   Patient is a well-developed and well-nourished female  in no acute distress and cooperative with exam. Head: Normocephalic and atraumatic Mouth: Mucus membranes moist Eyes: PERRL, EOMI, conjunctivae normal Neck: Supple, No Thyromegaly Cardiovascular: RRR, S1 normal, S2 normal Pulmonary/Chest: CTAB, no wheezes, rales, or rhonchi Abdominal: Soft. Non-tender, non-distended, bowel sounds are normal, no masses, organomegaly, or guarding present.  Neurological: A&O x3, Strenght is normal and symmetric bilaterally, cranial nerve II-XII are  grossly intact, no focal motor deficit, sensory intact to light touch bilaterally.  Extremities : No Cyanosis, Clubbing or Edema     Assessment/Plan  End-stage renal disease  Patient will be admitted for her dialysis, will consult nephrology service. Dr Ola Spurr has already put the orders for hemodialysis.   Hypertension  Will continue metoprolol, Hydralazine, Norvasc.   Diabetes mellitus  Continue Prandin, sliding scale insulin   Gout  Continue when necessary colchicine   DVT prophylaxis  heparin   Time Spent on Admission: 45 min  Lunetta Marina S [facility] Pager: [phone] 04/20/2011, 4:57 PM

## 2011-04-20 NOTE — Consults (Signed)
Pt. Is in the ER she will need dialysis.

## 2011-04-20 NOTE — ED Notes (Signed)
Pt taken to 6th floor to HD to receive dialysis, pt denies pain, pt ambulatory to the floor, pt had renal diet tray delivered to the floor, pt tolerated procedure well, NAD

## 2011-04-20 NOTE — ED Notes (Signed)
Placed diet order  

## 2011-04-20 NOTE — ED Provider Notes (Addendum)
History     CSN: [id number]  Arrival date & time 04/20/11  1432   First MD Initiated Contact with Patient 04/20/11 1535      Chief Complaint  Patient presents with  . routine dialysis     (Consider location/radiation/quality/duration/timing/severity/associated sxs/prior treatment) HPI Comments: Patient presents for dialysis. She comes here 3 times a week for routine dialysis. She denies any problems today. No shortness of breath, chest pain, swelling. Her last dialysis was on Saturday.  The history is provided by the patient.    Past Medical History  Diagnosis Date  . Chronic kidney disease     esrd  . Diabetes mellitus   . Hyperlipidemia   . Hypertension   . Renal disorder   . Dialysis care     tues, thurs, sat  . 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 Carotid endarterectomy 2002    left  . Btl   . Rectal fissurectomy   . Av fistula placement 10/30/2010    right brachiocephalic   . Fistulogram     Family History  Problem Relation Age of Onset  . COPD Mother   . Kidney disease Mother   . Heart disease Father   . Lymphoma Son     History  Substance Use Topics  . Smoking status: Current Everyday Smoker -- 1.0 packs/day for 50 years    Types: Cigarettes  . Smokeless tobacco: Never Used  . Alcohol Use: No    OB History    Grav Para Term Preterm Abortions TAB SAB Ect Mult Living                  Review of Systems  Constitutional: Negative for fever and chills.  Respiratory: Negative for cough, chest tightness and shortness of breath.   Cardiovascular: Negative for chest pain and leg swelling.  Gastrointestinal: Negative for nausea and vomiting.  All other systems reviewed and are negative.    Allergies  Codeine; Epinephrine; and Percocet  Home Medications   Current Outpatient Rx  Name Route Sig Dispense Refill  . ALLOPURINOL 100 MG PO TABS Oral Take 100 mg by mouth daily.      Marland Kitchen AMLODIPINE BESYLATE 5 MG PO  TABS Oral Take 5 mg by mouth at bedtime.     Marland Kitchen CALCIUM ACETATE 667 MG PO CAPS Oral Take 1,334 mg by mouth 3 (three) times daily with meals.     Marland Kitchen COENZYME Q10 150 MG PO CAPS Oral Take 300 mg by mouth daily.     . COLCHICINE 0.6 MG PO TABS Oral Take 0.6 mg by mouth daily as needed. For gout.    Marland Kitchen DOCUSATE SODIUM 100 MG PO CAPS Oral Take 200 mg by mouth at bedtime.      Marland Kitchen HYDRALAZINE HCL 25 MG PO TABS Oral Take 75 mg by mouth 3 (three) times daily.     . L-METHYLFOLATE-B6-B12 3-35-2 MG PO TABS Oral Take 1 tablet by mouth 2 (two) times daily.     Marland Kitchen LOSARTAN POTASSIUM 50 MG PO TABS Oral Take 50 mg by mouth at bedtime.     Marland Kitchen METOPROLOL SUCCINATE ER 50 MG PO TB24 Oral Take 50 mg by mouth at bedtime.     Marland Kitchen RENA-VITE PO TABS Oral Take 1 tablet by mouth daily.      Marland Kitchen REPAGLINIDE 0.5 MG PO TABS Oral Take 0.5 mg by mouth 3 (three) times daily before meals.  BP 189/62  Pulse 83  Temp(Src) 97.4 F (36.3 C) (Oral)  Resp 14  SpO2 95%  Physical Exam  Constitutional: She is oriented to person, place, and time. She appears well-developed and well-nourished. No distress.  HENT:  Head: Normocephalic and atraumatic.  Eyes: EOM are normal. Pupils are equal, round, and reactive to light.  Cardiovascular: Normal rate, normal heart sounds and intact distal pulses.   No murmur heard. Pulmonary/Chest: Effort normal and breath sounds normal. She has no rales.  Musculoskeletal: She exhibits no edema and no tenderness.  Neurological: She is alert and oriented to person, place, and time.  Skin: Skin is warm and dry.    ED Course  Procedures (including critical care time)  Labs Reviewed - No data to display No results found.   1. End stage renal disease       MDM   Patient here for her routine dialysis. She denies any shortness of breath, chest pain, swelling or any other complaints. She comes 3 times a week for dialysis. Dr. Jeri Cos has already seen her and written dialysis orders. Will  admit for dialysis.        Gwyneth Sprout, MD 04/20/11 1539  Gwyneth Sprout, MD 04/20/11 1540

## 2011-04-21 NOTE — Discharge Summary (Signed)
  Patient is a 79yrs aaf with hx of ESRD on dialysis was admitted by me on 04/08/2011 with no complaints but for her routine dialysis.She had dialysis and discharged same day and to continue her routine meds at home.Please refer to my H&P i.e for dialysis and discharge same day               Talmage Nap                   [phone]

## 2011-04-22 ENCOUNTER — Emergency Department (HOSPITAL_COMMUNITY): Admit: 2011-04-22 | Discharge: 2011-04-22 | Disposition: A | Discharge status: Home / Self Care | Payer: Medicare HMO

## 2011-04-22 ENCOUNTER — Observation Stay (HOSPITAL_COMMUNITY)
Admission: EM | Admit: 2011-04-22 | Discharge: 2011-04-22 | Disposition: A | Discharge status: Home / Self Care | Payer: Medicare HMO | Attending: Internal Medicine | Admitting: Internal Medicine

## 2011-04-22 ENCOUNTER — Encounter (HOSPITAL_COMMUNITY): Payer: Self-pay | Admitting: Emergency Medicine

## 2011-04-22 DIAGNOSIS — N186 End stage renal disease: Secondary | ICD-10-CM | POA: Insufficient documentation

## 2011-04-22 DIAGNOSIS — E119 Type 2 diabetes mellitus without complications: Secondary | ICD-10-CM | POA: Insufficient documentation

## 2011-04-22 DIAGNOSIS — I10 Essential (primary) hypertension: Secondary | ICD-10-CM

## 2011-04-22 DIAGNOSIS — N2581 Secondary hyperparathyroidism of renal origin: Secondary | ICD-10-CM | POA: Insufficient documentation

## 2011-04-22 DIAGNOSIS — I120 Hypertensive chronic kidney disease with stage 5 chronic kidney disease or end stage renal disease: Secondary | ICD-10-CM | POA: Insufficient documentation

## 2011-04-22 DIAGNOSIS — Z992 Dependence on renal dialysis: Principal | ICD-10-CM | POA: Insufficient documentation

## 2011-04-22 LAB — DIFFERENTIAL
Basophils Absolute: 0 10*3/uL (ref 0.0–0.1)
Basophils Relative: 0 % (ref 0–1)
Eosinophils Absolute: 0.1 10*3/uL (ref 0.0–0.7)
Monocytes Absolute: 0.6 10*3/uL (ref 0.1–1.0)
Neutro Abs: 3.9 10*3/uL (ref 1.7–7.7)
Neutrophils Relative %: 56 % (ref 43–77)

## 2011-04-22 LAB — CBC
MCH: 29.5 pg (ref 26.0–34.0)
MCHC: 32.6 g/dL (ref 30.0–36.0)
MCV: 90.6 fL (ref 78.0–100.0)
Platelets: 163 10*3/uL (ref 150–400)
RBC: 4.03 MIL/uL (ref 3.87–5.11)
RDW: 16.7 % — ABNORMAL HIGH (ref 11.5–15.5)

## 2011-04-22 LAB — RENAL FUNCTION PANEL
Albumin: 3.6 g/dL (ref 3.5–5.2)
BUN: 53 mg/dL — ABNORMAL HIGH (ref 6–23)
Calcium: 10.2 mg/dL (ref 8.4–10.5)
Phosphorus: 3.1 mg/dL (ref 2.3–4.6)
Potassium: 4.8 mEq/L (ref 3.5–5.1)

## 2011-04-22 MED ORDER — HEPARIN SODIUM (PORCINE) 1000 UNIT/ML DIALYSIS: 20.0000 [IU]/kg | INTRAMUSCULAR | Status: DC | PRN

## 2011-04-22 NOTE — ED Notes (Signed)
Pt here for routine dialysis; pt last dialysis was Tuesday; pt denies new complaint

## 2011-04-22 NOTE — ED Provider Notes (Signed)
History     CSN: [id number]  Arrival date & time 04/22/11  1440   First MD Initiated Contact with Patient 04/22/11 1501      Chief Complaint  Patient presents with  . Vascular Access Problem    (Consider location/radiation/quality/duration/timing/severity/associated sxs/prior treatment) HPI Comments: Patient presents for her routine hemodialysis. She denies any new complaints. Her last dialysis was on Tuesday. She denies any chest pain, shortness of breath, ankle swelling, nausea vomiting or abdominal pain.  The history is provided by the patient.    Past Medical History  Diagnosis Date  . Chronic kidney disease     esrd  . Diabetes mellitus   . Hyperlipidemia   . Hypertension   . Renal disorder   . Dialysis care     tues, thurs, sat  . 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 Carotid endarterectomy 2002    left  . Btl   . Rectal fissurectomy   . Av fistula placement 10/30/2010    right brachiocephalic   . Fistulogram     Family History  Problem Relation Age of Onset  . COPD Mother   . Kidney disease Mother   . Heart disease Father   . Lymphoma Son     History  Substance Use Topics  . Smoking status: Current Everyday Smoker -- 1.0 packs/day for 50 years    Types: Cigarettes  . Smokeless tobacco: Never Used  . Alcohol Use: No    OB History    Grav Para Term Preterm Abortions TAB SAB Ect Mult Living                  Review of Systems  Constitutional: Negative for fever, activity change and appetite change.  HENT: Negative for congestion and rhinorrhea.   Respiratory: Negative for choking, chest tightness and shortness of breath.   Cardiovascular: Negative for chest pain and leg swelling.  Gastrointestinal: Negative for nausea, vomiting and abdominal pain.  Genitourinary: Negative for dysuria and hematuria.  Musculoskeletal: Negative for back pain.  Skin: Negative for rash.  Neurological: Negative for dizziness,  weakness and headaches.    Allergies  Codeine; Epinephrine; and Percocet  Home Medications   No current outpatient prescriptions on file.  BP 160/60  Pulse 82  Temp(Src) 97.4 F (36.3 C) (Oral)  Resp 18  Wt 132 lb 4.4 oz (60 kg)  SpO2 94%  Physical Exam  Constitutional: She is oriented to person, place, and time. She appears well-developed and well-nourished. No distress.  HENT:  Head: Normocephalic and atraumatic.  Mouth/Throat: Oropharynx is clear and moist. No oropharyngeal exudate.  Eyes: Conjunctivae and EOM are normal. Pupils are equal, round, and reactive to light.  Neck: Normal range of motion. Neck supple.  Cardiovascular: Normal rate, regular rhythm and normal heart sounds.   Pulmonary/Chest: Effort normal and breath sounds normal. No respiratory distress.       Right chest dialysis catheter, clean dry and intact  Abdominal: Soft. There is no tenderness. There is no rebound and no guarding.  Musculoskeletal: Normal range of motion. She exhibits no edema and no tenderness.  Neurological: She is alert and oriented to person, place, and time. No cranial nerve deficit.  Skin: Skin is warm.    ED Course  Procedures (including critical care time)  Labs Reviewed  CBC - Abnormal; Notable for the following:    Hemoglobin 11.9 (*)    RDW 16.7 (*)  All other components within normal limits  RENAL FUNCTION PANEL - Abnormal; Notable for the following:    Glucose, Bld 133 (*)    BUN 53 (*)    Creatinine, Ser 7.65 (*)    GFR calc non Af Amer 5 (*)    GFR calc Af Amer 6 (*)    All other components within normal limits  DIFFERENTIAL  URIC ACID   No results found.   1. End stage renal disease   2. HTN (hypertension)   3. DM (diabetes mellitus)       MDM  Patient presents for routine hemodialysis. No complaints. Vital signs stable. No chest pain or shortness of breath. Dr. Bascom Levels has already written dialysis orders. We'll admit to [facility].       Glynn Octave, MD 04/22/11 2039

## 2011-04-22 NOTE — ED Notes (Signed)
Dialysis called and ready for patient

## 2011-04-22 NOTE — ED Notes (Signed)
Hemodialysis Ready for patient.

## 2011-04-22 NOTE — H&P (Signed)
History and Physical       Hospital Admission Note Date: 04/22/2011  Patient name: Alexandra [name] Medical record number: [id number] Date of birth: [birth year] Age: 66 y.o. Gender: female PCP: [name], MD, MD  Attending physician: Marlin Canary, DO   Chief Complaint:  "I am here for my dialysis"  HPI: Patient is a 66 year old African American female with history of end-stage renal disease on hemodialysis on Tuesdays, Thursdays and Saturdays hypertension and diabetes presented to the emergency room for her regular dialysis. Patient otherwise denied any history of chest pain or shortness of breath, fevers, chills or any rigors. She denied any history of abdominal pain, nausea, vomiting, diarrhea or hematochezia. Patient was already seen by nephrologist Dr. Bascom Levels and had written hemodialysis orders. Patient is currently receiving hemodialysis at the time of my encounter and wants to be discharged home after the dialysis.   Review of Systems:  Constitutional: Denies fever, chills, diaphoresis, appetite change and fatigue.  HEENT: Denies photophobia, eye pain, redness, hearing loss, ear pain, congestion, sore throat, rhinorrhea, sneezing, mouth sores, trouble swallowing, neck pain, neck stiffness and tinnitus.   Respiratory: Denies SOB, DOE, cough, chest tightness,  and wheezing.   Cardiovascular: Denies chest pain, palpitations and leg swelling.  Gastrointestinal: Denies nausea, vomiting, abdominal pain, diarrhea, constipation, blood in stool and abdominal distention.  Genitourinary: Denies dysuria, urgency, frequency, hematuria, flank pain and difficulty urinating.  Musculoskeletal: Denies myalgias, back pain, joint swelling, arthralgias and gait problem.  Skin: Denies pallor, rash and wound.  Neurological: Denies dizziness, seizures, syncope, weakness, light-headedness, numbness and headaches.  Hematological: Denies  adenopathy. Easy bruising, personal or family bleeding history  Psychiatric/Behavioral: Denies suicidal ideation, mood changes, confusion, nervousness, sleep disturbance and agitation  Past Medical History: Past Medical History  Diagnosis Date  . Chronic kidney disease     esrd  . Diabetes mellitus   . Hyperlipidemia   . Hypertension   . Renal disorder   . Dialysis care     tues, thurs, sat  . Gout due to renal impairment    Past Surgical History  Procedure Date  . Carotid endarterectomy 2002    left  . Btl   . Rectal fissurectomy   . Av fistula placement 10/30/2010    right brachiocephalic   . Fistulogram     Medications: Prior to Admission medications   Medication Sig Start Date End Date Taking? Authorizing Provider  allopurinol (ZYLOPRIM) 100 MG tablet Take 100 mg by mouth daily.     Yes Historical Provider, MD  amLODipine (NORVASC) 5 MG tablet Take 5 mg by mouth at bedtime.    Yes Historical Provider, MD  calcium acetate (PHOSLO) 667 MG capsule Take 1,334 mg by mouth 3 (three) times daily with meals.    Yes Historical Provider, MD  Coenzyme Q10 150 MG CAPS Take 300 mg by mouth daily.    Yes Historical Provider, MD  docusate sodium (COLACE) 100 MG capsule Take 200 mg by mouth at bedtime.     Yes Historical Provider, MD  hydrALAZINE (APRESOLINE) 25 MG tablet Take 75 mg by mouth 3 (three) times daily.    Yes Historical Provider, MD  l-methylfolate-B6-B12 (METANX) 3-35-2 MG TABS Take 1 tablet by mouth 2 (two) times daily.    Yes Historical Provider, MD  losartan (COZAAR) 50 MG tablet Take 50 mg by mouth at bedtime.    Yes Historical Provider, MD  metoprolol (TOPROL-XL) 50 MG 24 hr tablet Take 50 mg by mouth at bedtime.  Yes Historical Provider, MD  multivitamin (RENA-VIT) TABS tablet Take 1 tablet by mouth daily.     Yes Historical Provider, MD  repaglinide (PRANDIN) 0.5 MG tablet Take 0.5 mg by mouth 3 (three) times daily before meals.     Yes Historical Provider, MD    colchicine 0.6 MG tablet Take 0.6 mg by mouth daily as needed. For gout.    Historical Provider, MD    Allergies:   Allergies  Allergen Reactions  . Codeine Other (See Comments)    Passes out  . Epinephrine Other (See Comments)    Tachycardia, diaphoresis, syncope  . Percocet (Oxycodone-Acetaminophen) Other (See Comments)    Passes  out    Social History:  reports that she has been smoking Cigarettes.  She has a 50 pack-year smoking history. She has never used smokeless tobacco. She reports that she does not drink alcohol or use illicit drugs.  Family History: Family History  Problem Relation Age of Onset  . COPD Mother   . Kidney disease Mother   . Heart disease Father   . Lymphoma Son     Physical Exam: Blood pressure 170/99, pulse 77, temperature 97.2 F (36.2 C), temperature source Oral, resp. rate 16, weight 60.4 kg (133 lb 2.5 oz), SpO2 98.00%. General: Alert, awake, oriented x3, in no acute distress. HEENT: anicteric sclera, pink conjunctiva, pupils equal and reactive to light and accomodation Neck: supple, no masses or lymphadenopathy, no goiter, no bruits  Heart: Regular rate and rhythm, without murmurs, rubs or gallops. Lungs: Clear to auscultation bilaterally, no wheezing, rales or rhonchi. Abdomen: Soft, nontender, nondistended, positive bowel sounds, no masses. Extremities: No clubbing, cyanosis or edema with positive pedal pulses. Neuro: Grossly intact, no focal neurological deficits, strength 5/5 upper and lower extremities bilaterally Psych: alert and oriented x 3, normal mood and affect Skin: no rashes or lesions, warm and dry   LABS on Admission:  Basic Metabolic Panel:  Lab 04/22/11 1610 04/20/11 1827  NA 138 139  K 4.8 5.1  CL 97 102  CO2 26 25  GLUCOSE 133* 65*  BUN 53* 77*  CREATININE 7.65* 8.97*  CALCIUM 10.2 10.2  MG -- --  PHOS 3.1 --   Liver Function Tests:  Lab 04/22/11 1616 04/20/11 1827  AST -- --  ALT -- --  ALKPHOS -- --   BILITOT -- --  PROT -- --  ALBUMIN 3.6 3.5   CBC:  Lab 04/22/11 1616 04/20/11 1827  WBC 6.9 6.9  NEUTROABS 3.9 --  HGB 11.9* 11.8*  HCT 36.5 36.3  MCV 90.6 --  PLT 163 179   CBG:  Lab 04/15/11 1913  GLUCAP 166*     Radiological Exams on Admission: No results found.  Assessment/Plan 66 year old female with PMH significant for ESRD, DM, Hypertension on dialysis, (Tue, Thurs, Sat) presents for dialysis   End stage renal disease on Hemodialysis: - Patient here for her scheduled HD today. Dr Bascom Levels has already consulted and placed in hemodialysis orders - clinically stable with no signs of fluid overload or any chest pain  HTN (hypertension) :Stable continue home meds   DM (diabetes mellitus) : Place on sliding scale insulin  Secondary hyperparathyroidism : Per renal service   History of gout: Currently stable, on PRN colchicine  DVT prophylaxis : SCDs   Disposition: Patient likely to be discharged home after the hemodialysis  @Time  Spent on Admission: 45 mins RAI,RIPUDEEP M.D. [facility] 04/22/2011, 5:34 PM

## 2011-04-22 NOTE — Consults (Signed)
Pt. In today .she is to get dialysis.

## 2011-04-22 NOTE — Discharge Summary (Signed)
Physician Discharge Summary  Patient ID: Alexandra [name] MRN: [id number] DOB/AGE: [birth year] 66 y.o.  Admit date: 04/22/2011 Discharge date: 04/22/2011  Primary Care Physician:  [name], MD, MD  Discharge Diagnoses:   End-stage renal disease Hypertension Diabetes mellitus Secondary hyperparathyroidism History of gout  Consults:  Jeri Cos, M.D. Nephrology  Discharge Medications: Medication List  As of 04/22/2011  8:21 PM   TAKE these medications         allopurinol 100 MG tablet   Commonly known as: ZYLOPRIM   Take 100 mg by mouth daily.      amLODipine 5 MG tablet   Commonly known as: NORVASC   Take 5 mg by mouth at bedtime.      calcium acetate 667 MG capsule   Commonly known as: PHOSLO   Take 1,334 mg by mouth 3 (three) times daily with meals.      Coenzyme Q10 150 MG Caps   Take 300 mg by mouth daily.      colchicine 0.6 MG tablet   Take 0.6 mg by mouth daily as needed. For gout.      docusate sodium 100 MG capsule   Commonly known as: COLACE   Take 200 mg by mouth at bedtime.      hydrALAZINE 25 MG tablet   Commonly known as: APRESOLINE   Take 75 mg by mouth 3 (three) times daily.      l-methylfolate-B6-B12 3-35-2 MG Tabs   Commonly known as: METANX   Take 1 tablet by mouth 2 (two) times daily.      losartan 50 MG tablet   Commonly known as: COZAAR   Take 50 mg by mouth at bedtime.      metoprolol succinate 50 MG 24 hr tablet   Commonly known as: TOPROL-XL   Take 50 mg by mouth at bedtime.      multivitamin Tabs tablet   Take 1 tablet by mouth daily.      repaglinide 0.5 MG tablet   Commonly known as: PRANDIN   Take 0.5 mg by mouth 3 (three) times daily before meals.             Brief H and P: For complete details please refer to admission H and P, but in brief patient is a 66 year old [place] American female with history of end-stage renal disease who presented to the [facility] emergency room needing another dialysis  today.  Hospital Course:  Patient is a 66 year old female with history of end-stage renal disease, diabetes, on dialysis presented requesting for dialysis per schedule End-stage renal disease on hemodialysis: Patient was seen by Dr. Bascom Levels in the emergency room and hemodialysis orders were placed. Patient was continued on all her home medications. She received the hemodialysis and was discharged home to continue treatment in home medications.   Day of Discharge BP 110/64  Pulse 81  Temp(Src) 97.2 F (36.2 C) (Oral)  Resp 18  Wt 60.4 kg (133 lb 2.5 oz)  SpO2 98%  Physical Exam: General: Alert and awake oriented x3 not in any acute distress. HEENT: anicteric sclera, pupils reactive to light and accommodation CVS: S1-S2 clear no murmur rubs or gallops Chest: clear to auscultation bilaterally, no wheezing rales or rhonchi Abdomen: soft nontender, nondistended, normal bowel sounds, no organomegaly Extremities: no cyanosis, clubbing or edema noted bilaterally Neuro: Cranial nerves II-XII intact, no focal neurological deficits   The results of significant diagnostics from this hospitalization (including imaging, microbiology, ancillary and laboratory) are listed below for  reference.    LAB RESULTS: Basic Metabolic Panel:  Lab 04/22/11 8119 04/20/11 1827  NA 138 139  K 4.8 5.1  CL 97 102  CO2 26 25  GLUCOSE 133* 65*  BUN 53* 77*  CREATININE 7.65* 8.97*  CALCIUM 10.2 10.2  MG -- --  PHOS 3.1 --   Liver Function Tests:  Lab 04/22/11 1616 04/20/11 1827  AST -- --  ALT -- --  ALKPHOS -- --  BILITOT -- --  PROT -- --  ALBUMIN 3.6 3.5   CBC:  Lab 04/22/11 1616 04/20/11 1827  WBC 6.9 6.9  NEUTROABS 3.9 --  HGB 11.9* 11.8*  HCT 36.5 36.3  MCV 90.6 --  PLT 163 179    Significant Diagnostic Studies:  No results found.   Disposition and Follow-up: Discharge Orders    Future Orders Please Complete By Expires   Diet - low sodium heart healthy      Increase activity  slowly          DISPOSITION: Home  DIET: Renal diet  ACTIVITY: As tolerated  DISCHARGE FOLLOW-UP Follow-up Information    Follow up with [name], MD. Schedule an appointment as soon as possible for a visit in 7 days.         Time spent on Discharge: 45 minutes  Signed:  [name] M.D. [facility] 04/22/2011, 8:21 PM

## 2011-04-22 NOTE — Progress Notes (Signed)
Pt arrived from ED to Hemodialysis. Will be discharged back to ED post treatment per Dr. Bascom Levels

## 2011-04-23 NOTE — Progress Notes (Signed)
Utilization review complete 

## 2011-04-24 ENCOUNTER — Observation Stay (HOSPITAL_COMMUNITY)
Admission: EM | Admit: 2011-04-24 | Discharge: 2011-04-24 | Disposition: A | Discharge status: Home / Self Care | Payer: Medicare HMO | Attending: Internal Medicine | Admitting: Internal Medicine

## 2011-04-24 ENCOUNTER — Encounter (HOSPITAL_COMMUNITY): Payer: Self-pay | Admitting: *Deleted

## 2011-04-24 DIAGNOSIS — D638 Anemia in other chronic diseases classified elsewhere: Secondary | ICD-10-CM | POA: Diagnosis present

## 2011-04-24 DIAGNOSIS — N189 Chronic kidney disease, unspecified: Secondary | ICD-10-CM

## 2011-04-24 DIAGNOSIS — Z992 Dependence on renal dialysis: Secondary | ICD-10-CM | POA: Insufficient documentation

## 2011-04-24 DIAGNOSIS — E119 Type 2 diabetes mellitus without complications: Secondary | ICD-10-CM | POA: Insufficient documentation

## 2011-04-24 DIAGNOSIS — E785 Hyperlipidemia, unspecified: Secondary | ICD-10-CM | POA: Diagnosis present

## 2011-04-24 DIAGNOSIS — I120 Hypertensive chronic kidney disease with stage 5 chronic kidney disease or end stage renal disease: Secondary | ICD-10-CM | POA: Insufficient documentation

## 2011-04-24 DIAGNOSIS — N186 End stage renal disease: Secondary | ICD-10-CM | POA: Insufficient documentation

## 2011-04-24 DIAGNOSIS — N2581 Secondary hyperparathyroidism of renal origin: Secondary | ICD-10-CM | POA: Diagnosis present

## 2011-04-24 LAB — RENAL FUNCTION PANEL
Albumin: 3.4 g/dL — ABNORMAL LOW (ref 3.5–5.2)
BUN: 57 mg/dL — ABNORMAL HIGH (ref 6–23)
Calcium: 9.9 mg/dL (ref 8.4–10.5)
Creatinine, Ser: 7.61 mg/dL — ABNORMAL HIGH (ref 0.50–1.10)
Glucose, Bld: 155 mg/dL — ABNORMAL HIGH (ref 70–99)
Phosphorus: 2.4 mg/dL (ref 2.3–4.6)
Potassium: 4 mEq/L (ref 3.5–5.1)

## 2011-04-24 LAB — DIFFERENTIAL
Basophils Relative: 0 % (ref 0–1)
Eosinophils Absolute: 0.2 10*3/uL (ref 0.0–0.7)
Eosinophils Relative: 2 % (ref 0–5)
Lymphs Abs: 2.4 10*3/uL (ref 0.7–4.0)
Monocytes Absolute: 0.6 10*3/uL (ref 0.1–1.0)
Monocytes Relative: 9 % (ref 3–12)
Neutrophils Relative %: 51 % (ref 43–77)

## 2011-04-24 LAB — CBC
Hemoglobin: 11.4 g/dL — ABNORMAL LOW (ref 12.0–15.0)
MCH: 29.3 pg (ref 26.0–34.0)
MCHC: 32.9 g/dL (ref 30.0–36.0)
MCV: 89.2 fL (ref 78.0–100.0)
RBC: 3.89 MIL/uL (ref 3.87–5.11)

## 2011-04-24 LAB — MAGNESIUM: Magnesium: 2.4 mg/dL (ref 1.5–2.5)

## 2011-04-24 MED ORDER — HEPARIN SODIUM (PORCINE) 1000 UNIT/ML DIALYSIS
20.0000 [IU]/kg | INTRAMUSCULAR | Status: DC | PRN
  Administered 2011-04-24: 1200 [IU] via INTRAVENOUS_CENTRAL
  Filled 2011-04-24: qty 2

## 2011-04-24 MED ORDER — PARICALCITOL 5 MCG/ML IV SOLN
INTRAVENOUS | Status: AC
  Administered 2011-04-24: 1 ug via INTRAVENOUS
  Filled 2011-04-24: qty 1

## 2011-04-24 MED ORDER — PARICALCITOL 1 MCG PO CAPS: 1.0000 ug | ORAL_CAPSULE | Freq: Every day | ORAL | Status: DC

## 2011-04-24 MED ORDER — PARICALCITOL 5 MCG/ML IV SOLN
1.0000 ug | Freq: Once | INTRAVENOUS | Status: AC
  Administered 2011-04-24: 1 ug via INTRAVENOUS
  Filled 2011-04-24: qty 0.2

## 2011-04-24 NOTE — ED Notes (Signed)
Pt eating dinner.  Denies any complaints at this time.

## 2011-04-24 NOTE — ED Notes (Signed)
Called Dialysis.  Pt ok for transport.

## 2011-04-24 NOTE — ED Provider Notes (Signed)
History     CSN: [id number]  Arrival date & time 04/24/11  1500   First MD Initiated Contact with Patient 04/24/11 1514      Chief Complaint  Patient presents with  . Vascular Access Problem    (Consider location/radiation/quality/duration/timing/severity/associated sxs/prior treatment) HPI Comments: Ms Floree Zuniga is  A [phone] with PMH significant for CKD on HD T, Th, Sat without a regular dialysis center. She sees Dr. Bascom Levels with nephrology. Gets HD through ED admission. Today is her regular dialysis day. She has not missed any dialysis visits recently. She has no complaints.   Patient is a 66 y.o. female presenting with general illness. The history is provided by the patient and medical records.  Illness  Episode onset: chronic. Pertinent negatives include no fever, no abdominal pain, no vomiting, no headaches and no cough.    Past Medical History  Diagnosis Date  . Chronic kidney disease     esrd  . Diabetes mellitus   . Hyperlipidemia   . Hypertension   . Renal disorder   . Dialysis care     tues, thurs, sat  . 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 Carotid endarterectomy 2002    left  . Btl   . Rectal fissurectomy   . Av fistula placement 10/30/2010    right brachiocephalic   . Fistulogram     Family History  Problem Relation Age of Onset  . COPD Mother   . Kidney disease Mother   . Heart disease Father   . Lymphoma Son     History  Substance Use Topics  . Smoking status: Current Everyday Smoker -- 1.0 packs/day for 50 years    Types: Cigarettes  . Smokeless tobacco: Never Used  . Alcohol Use: No    OB History    Grav Para Term Preterm Abortions TAB SAB Ect Mult Living                  Review of Systems  Constitutional: Negative for fever.  Respiratory: Negative for cough and shortness of breath.   Cardiovascular: Negative for chest pain, palpitations and leg swelling.  Gastrointestinal: Negative for  vomiting and abdominal pain.  Genitourinary: Negative for flank pain.  Neurological: Negative for headaches.  Psychiatric/Behavioral: Negative for confusion.    Allergies  Codeine; Epinephrine; and Percocet  Home Medications   Current Outpatient Rx  Name Route Sig Dispense Refill  . ALLOPURINOL 100 MG PO TABS Oral Take 100 mg by mouth daily.      Marland Kitchen AMLODIPINE BESYLATE 5 MG PO TABS Oral Take 5 mg by mouth at bedtime.     Marland Kitchen CALCIUM ACETATE 667 MG PO CAPS Oral Take 1,334 mg by mouth 3 (three) times daily with meals.     Marland Kitchen COENZYME Q10 150 MG PO CAPS Oral Take 300 mg by mouth daily.     . COLCHICINE 0.6 MG PO TABS Oral Take 0.6 mg by mouth daily as needed. For gout.    Marland Kitchen DOCUSATE SODIUM 100 MG PO CAPS Oral Take 200 mg by mouth at bedtime.      Marland Kitchen HYDRALAZINE HCL 25 MG PO TABS Oral Take 75 mg by mouth 3 (three) times daily.     . L-METHYLFOLATE-B6-B12 3-35-2 MG PO TABS Oral Take 1 tablet by mouth 2 (two) times daily.     Marland Kitchen LOSARTAN POTASSIUM 50 MG PO TABS Oral Take 50 mg by mouth at bedtime.     Marland Kitchen  METOPROLOL SUCCINATE ER 50 MG PO TB24 Oral Take 50 mg by mouth at bedtime.     Marland Kitchen RENA-VITE PO TABS Oral Take 1 tablet by mouth daily.      Marland Kitchen REPAGLINIDE 0.5 MG PO TABS Oral Take 0.5 mg by mouth 3 (three) times daily before meals.        BP 183/61  Pulse 83  Temp(Src) 98.3 F (36.8 C) (Oral)  Resp 20  SpO2 98%  Physical Exam  Constitutional: She is oriented to person, place, and time. She appears well-developed and well-nourished.  HENT:  Head: Normocephalic and atraumatic.  Eyes: EOM are normal.  Neck: Normal range of motion.  Cardiovascular: Normal rate and regular rhythm.   Pulmonary/Chest: Effort normal. No respiratory distress.  Abdominal: Soft. She exhibits no distension.  Musculoskeletal: Normal range of motion.  Neurological: She is alert and oriented to person, place, and time.  Skin: Skin is warm and dry.    ED Course  Procedures (including critical care time)  Labs  Reviewed - No data to display No results found.   1. Chronic kidney disease       MDM  Pt with hx ESRD on T, Th, Sat HD. She usually comes to the ED where her nephrologist Dr. Bascom Levels arranges for HD as inpatient via [facility]. Last HD 2d ago. No complaints. Appears well. Pt refuses lab draw or EKG as this is her norm. Dr. Bascom Levels at bedside to see pt and will arrange for HD. Dr. Bascom Levels requesting [facility] admission. [facility] consulted.   [facility] admitting.      Verne Carrow, MD 04/24/11 [phone]

## 2011-04-24 NOTE — ED Notes (Signed)
MD at bedside.  Provided pt with hot water per pt request.

## 2011-04-24 NOTE — ED Notes (Signed)
Asked pt if this RN could check vitals.  Pt declined.  Pt stated she was fine and did not need vitals.

## 2011-04-24 NOTE — ED Provider Notes (Signed)
I saw and evaluated the patient, reviewed the resident's note and I agree with the findings and plan.  Pt asx at this time. Seen by Dr. Bascom Levels in ED for dialysis orders. Admitted to [facility] hospitalist.  Forbes Cellar, MD 04/24/11 1630

## 2011-04-24 NOTE — Consults (Signed)
Pt. In ER . Waiting for dialysis. Would also like to do a creatinine clearance.see orders.

## 2011-04-24 NOTE — H&P (Signed)
History and Physical       Hospital Admission Note Date: 04/24/2011  Patient name: Alexandra [name] Medical record number: [id number] Date of birth: 05-Nov-1945 Age: 66 y.o. Gender: female PCP: [name], MD, MD  Attending physician: Marlin Canary, DO   Chief Complaint:  "I am here for my dialysis"  HPI: Patient is a 66 year old African American woman with history of end-stage renal disease due to diabetes and hypertension on hemodialysis on Tuesdays, Thursdays and Saturdays hypertension and diabetes presented to the emergency room for her regular dialysis. Patient otherwise denied any history of chest pain or shortness of breath, fevers, chills or any rigors. She denied any history of abdominal pain, nausea, vomiting, diarrhea or hematochezia. Patient was already seen by nephrologist Dr. Bascom Levels and had written hemodialysis orders.  She took her medications as prescribed this morning and has them with her.  Review of Systems:  Constitutional: Denies fever, chills, diaphoresis, appetite change and fatigue.  HEENT: Denies photophobia, eye pain, redness, hearing loss, ear pain, congestion, sore throat, rhinorrhea, sneezing, mouth sores, trouble swallowing, neck pain, neck stiffness and tinnitus.   Respiratory: Denies SOB, DOE, cough, chest tightness,  and wheezing.   Cardiovascular: Denies chest pain, palpitations and leg swelling.  Gastrointestinal: Denies nausea, vomiting, abdominal pain, diarrhea, constipation, blood in stool and abdominal distention.  Genitourinary: Denies dysuria, urgency, frequency, hematuria, flank pain and difficulty urinating.  Musculoskeletal: Denies myalgias, back pain, joint swelling, arthralgias and gait problem.  Skin: Denies pallor, rash and wound.  Neurological: Denies dizziness, seizures, syncope, weakness, light-headedness, numbness and headaches.  Hematological: Denies adenopathy. Easy bruising,  personal or family bleeding history  Psychiatric/Behavioral: Denies suicidal ideation, mood changes, confusion, nervousness, sleep disturbance and agitation  Past Medical History: Past Medical History  Diagnosis Date  . Chronic kidney disease     esrd  . Diabetes mellitus   . Hyperlipidemia   . Hypertension   . Renal disorder   . Dialysis care     tues, thurs, sat  . Gout due to renal impairment    Past Surgical History  Procedure Date  . Carotid endarterectomy 2002    left  . Btl   . Rectal fissurectomy   . Av fistula placement 10/30/2010    right brachiocephalic   . Fistulogram     Medications: Prior to Admission medications   Medication Sig Start Date End Date Taking? Authorizing Provider  allopurinol (ZYLOPRIM) 100 MG tablet Take 100 mg by mouth daily.     Yes Historical Provider, MD  amLODipine (NORVASC) 5 MG tablet Take 5 mg by mouth at bedtime.    Yes Historical Provider, MD  calcium acetate (PHOSLO) 667 MG capsule Take 1,334 mg by mouth 3 (three) times daily with meals.    Yes Historical Provider, MD  Coenzyme Q10 150 MG CAPS Take 300 mg by mouth daily.    Yes Historical Provider, MD  docusate sodium (COLACE) 100 MG capsule Take 200 mg by mouth at bedtime.     Yes Historical Provider, MD  hydrALAZINE (APRESOLINE) 25 MG tablet Take 75 mg by mouth 3 (three) times daily.    Yes Historical Provider, MD  l-methylfolate-B6-B12 (METANX) 3-35-2 MG TABS Take 1 tablet by mouth 2 (two) times daily.    Yes Historical Provider, MD  losartan (COZAAR) 50 MG tablet Take 50 mg by mouth at bedtime.    Yes Historical Provider, MD  metoprolol (TOPROL-XL) 50 MG 24 hr tablet Take 50 mg by mouth at bedtime.  Yes Historical Provider, MD  multivitamin (RENA-VIT) TABS tablet Take 1 tablet by mouth daily.     Yes Historical Provider, MD  repaglinide (PRANDIN) 0.5 MG tablet Take 0.5 mg by mouth 3 (three) times daily before meals.     Yes Historical Provider, MD  colchicine 0.6 MG tablet Take  0.6 mg by mouth daily as needed. For gout.    Historical Provider, MD    Allergies:   Allergies  Allergen Reactions  . Codeine Other (See Comments)    Passes out  . Epinephrine Other (See Comments)    Tachycardia, diaphoresis, syncope  . Percocet (Oxycodone-Acetaminophen) Other (See Comments)    Passes  out    Social History:  reports that she has been smoking Cigarettes.  She has a 50 pack-year smoking history. She has never used smokeless tobacco. She reports that she does not drink alcohol or use illicit drugs. She smokes 1 ppd. She works as a Leisure centre manager.  Family History: Family History  Problem Relation Age of Onset  . COPD Mother   . Kidney disease Mother   . Heart disease Father   . Lymphoma Son     Physical Exam: Blood pressure 183/61, pulse 83, temperature 98.3 F (36.8 C), temperature source Oral, resp. rate 20, height 5' 8.11" (1.73 m), weight 60 kg (132 lb 4.4 oz), SpO2 98.00%. General: Alert, awake, oriented x3, in no acute distress. HEENT: anicteric sclera, pink conjunctiva, pupils equal and reactive to light and accomodation Neck: supple, no masses or lymphadenopathy, no goiter, no bruits  Heart: Regular rate and rhythm, without murmurs, rubs or gallops. Lungs: Clear to auscultation bilaterally, no wheezing, rales or rhonchi. Abdomen: Soft, nontender, nondistended, positive bowel sounds, no masses. Extremities: No clubbing, cyanosis or edema with positive pedal pulses. Neuro: Grossly intact, no focal neurological deficits, strength 5/5 upper and lower extremities bilaterally Psych: alert and oriented x 3, normal mood and affect Skin: no rashes or lesions, warm and dry   LABS on Admission:  Basic Metabolic Panel:  Lab 04/22/11 1610 04/20/11 1827  NA 138 139  K 4.8 5.1  CL 97 102  CO2 26 25  GLUCOSE 133* 65*  BUN 53* 77*  CREATININE 7.65* 8.97*  CALCIUM 10.2 10.2  MG -- --  PHOS 3.1 --   Liver Function Tests:  Lab  04/22/11 1616 04/20/11 1827  AST -- --  ALT -- --  ALKPHOS -- --  BILITOT -- --  PROT -- --  ALBUMIN 3.6 3.5   CBC:  Lab 04/22/11 1616 04/20/11 1827  WBC 6.9 6.9  NEUTROABS 3.9 --  HGB 11.9* 11.8*  HCT 36.5 36.3  MCV 90.6 --  PLT 163 179   CBG: No results found for this basename: GLUCAP:2 in the last 168 hours   Radiological Exams on Admission: No results found.  Assessment/Plan 66 year old female with PMH significant for ESRD, DM, Hypertension on dialysis, (Tue, Thurs, Sat) presents for dialysis   End stage renal disease on Hemodialysis: - Patient here for her scheduled HD today. Dr Bascom Levels has already consulted and placed in hemodialysis orders - clinically stable with no signs of fluid overload or any chest pain. She reports that she continues to make urine.  HTN (hypertension) :Stable continue home meds   DM (diabetes mellitus) : Place on sliding scale insulin.  Secondary hyperparathyroidism : Per renal service   History of gout: Currently stable, on PRN colchicine  DVT prophylaxis : SCDs   Disposition: Patient  likely to be discharged home after the hemodialysis  @Time  Spent on Admission: 45 mins [facility] M.D. [facility] 04/24/2011, 4:30 PM    Review of Systems     Physical Exam

## 2011-04-24 NOTE — ED Notes (Signed)
MD at bedside. 

## 2011-04-24 NOTE — ED Notes (Signed)
Pt here for dialysis, last tx was Thursday. No other complaints, no distress noted.

## 2011-04-24 NOTE — ED Notes (Signed)
Food tray ordered

## 2011-04-24 NOTE — ED Notes (Signed)
Nephro MD at bedside

## 2011-04-24 NOTE — ED Notes (Signed)
Pt denies any complaints at this time.  Pt has dialysis access in rt upper chest.  Pt aware of plan to be transported to dialysis as soon as space is available.

## 2011-04-25 NOTE — Discharge Summary (Signed)
Discharge Summary  Alexandra [name] MR#: [id number]  DOB:1945-07-29  Date of Admission: 04/24/2011 Date of Discharge: 04/25/2011  Patient's PCP: [name], MD, MD  Attending Physician:Emidio Warrell A  Consults: Dr. Marya Landry, Renal  Discharge Diagnoses: Active Problems:  End stage renal disease  HTN (hypertension)  DM (diabetes mellitus)  Hyperparathyroidism, secondary renal  Hyperlipidemia  Anemia of chronic disease  Vasovagal syncope  Gout  Hemodialysis patient  Brief Admitting History and Physical 66 year old [place] American female with history of end-stage renal disease due to diabetes and hypertension who presents normally for hemodialysis on Tuesday, Thursdays and Saturdays presented again to the emergency department for a regular dialysis.  Discharge Medications Medication List  As of 04/25/2011  8:24 AM   ASK your doctor about these medications         allopurinol 100 MG tablet   Commonly known as: ZYLOPRIM   Take 100 mg by mouth daily.      amLODipine 5 MG tablet   Commonly known as: NORVASC   Take 5 mg by mouth at bedtime.      calcium acetate 667 MG capsule   Commonly known as: PHOSLO   Take 1,334 mg by mouth 3 (three) times daily with meals.      Coenzyme Q10 150 MG Caps   Take 300 mg by mouth daily.      colchicine 0.6 MG tablet   Take 0.6 mg by mouth daily as needed. For gout.      docusate sodium 100 MG capsule   Commonly known as: COLACE   Take 200 mg by mouth at bedtime.      hydrALAZINE 25 MG tablet   Commonly known as: APRESOLINE   Take 75 mg by mouth 3 (three) times daily.      l-methylfolate-B6-B12 3-35-2 MG Tabs   Commonly known as: METANX   Take 1 tablet by mouth 2 (two) times daily.      losartan 50 MG tablet   Commonly known as: COZAAR   Take 50 mg by mouth at bedtime.      metoprolol succinate 50 MG 24 hr tablet   Commonly known as: TOPROL-XL   Take 50 mg by mouth at bedtime.      multivitamin Tabs tablet   Take 1  tablet by mouth daily.      repaglinide 0.5 MG tablet   Commonly known as: PRANDIN   Take 0.5 mg by mouth 3 (three) times daily before meals.            Hospital Course: I did not see or examine the patient.  Patient was admitted by Dr. Orson Slick.  After having patient's regularly scheduled dialysis as per Dr. Bascom Levels patient left AGAINST MEDICAL ADVICE which is what she normally does after having dialysis.  Day of Discharge BP 135/65  Pulse 80  Temp(Src) 98.4 F (36.9 C) (Oral)  Resp 18  Ht 5' 8.11" (1.73 m)  Wt 59.7 kg (131 lb 9.8 oz)  BMI 19.95 kg/m2  SpO2 100%  Results for orders placed during the hospital encounter of 04/24/11 (from the past 48 hour(s))  CBC     Status: Abnormal   Collection Time   04/24/11  6:31 PM      Component Value Range Comment   WBC 6.5  4.0 - 10.5 (K/uL)    RBC 3.89  3.87 - 5.11 (MIL/uL)    Hemoglobin 11.4 (*) 12.0 - 15.0 (g/dL)    HCT 40.9 (*) 81.1 - 46.0 (%)  MCV 89.2  78.0 - 100.0 (fL)    MCH 29.3  26.0 - 34.0 (pg)    MCHC 32.9  30.0 - 36.0 (g/dL)    RDW 16.1 (*) 09.6 - 15.5 (%)    Platelets 163  150 - 400 (K/uL)   DIFFERENTIAL     Status: Normal   Collection Time   04/24/11  6:31 PM      Component Value Range Comment   Neutrophils Relative 51  43 - 77 (%)    Neutro Abs 3.3  1.7 - 7.7 (K/uL)    Lymphocytes Relative 38  12 - 46 (%)    Lymphs Abs 2.4  0.7 - 4.0 (K/uL)    Monocytes Relative 9  3 - 12 (%)    Monocytes Absolute 0.6  0.1 - 1.0 (K/uL)    Eosinophils Relative 2  0 - 5 (%)    Eosinophils Absolute 0.2  0.0 - 0.7 (K/uL)    Basophils Relative 0  0 - 1 (%)    Basophils Absolute 0.0  0.0 - 0.1 (K/uL)   RENAL FUNCTION PANEL     Status: Abnormal   Collection Time   04/24/11  6:31 PM      Component Value Range Comment   Sodium 138  135 - 145 (mEq/L)    Potassium 4.0  3.5 - 5.1 (mEq/L)    Chloride 97  96 - 112 (mEq/L)    CO2 28  19 - 32 (mEq/L)    Glucose, Bld 155 (*) 70 - 99 (mg/dL)    BUN 57 (*) 6 - 23 (mg/dL)    Creatinine,  Ser 0.45 (*) 0.50 - 1.10 (mg/dL)    Calcium 9.9  8.4 - 10.5 (mg/dL)    Phosphorus 2.4  2.3 - 4.6 (mg/dL)    Albumin 3.4 (*) 3.5 - 5.2 (g/dL)    GFR calc non Af Amer 5 (*) >90 (mL/min)    GFR calc Af Amer 6 (*) >90 (mL/min)   MAGNESIUM     Status: Normal   Collection Time   04/24/11  6:31 PM      Component Value Range Comment   Magnesium 2.4  1.5 - 2.5 (mg/dL)     No results found.   Signed: Cristal Ford, MD 04/25/2011, 8:24 AM

## 2011-04-27 ENCOUNTER — Observation Stay (HOSPITAL_COMMUNITY)
Admission: EM | Admit: 2011-04-27 | Discharge: 2011-04-28 | Disposition: A | Discharge status: Home / Self Care | Payer: Medicare HMO | Attending: Family Medicine | Admitting: Family Medicine

## 2011-04-27 ENCOUNTER — Encounter (HOSPITAL_COMMUNITY): Payer: Self-pay | Admitting: *Deleted

## 2011-04-27 DIAGNOSIS — N186 End stage renal disease: Secondary | ICD-10-CM | POA: Insufficient documentation

## 2011-04-27 DIAGNOSIS — E785 Hyperlipidemia, unspecified: Secondary | ICD-10-CM | POA: Insufficient documentation

## 2011-04-27 DIAGNOSIS — Z992 Dependence on renal dialysis: Principal | ICD-10-CM | POA: Insufficient documentation

## 2011-04-27 DIAGNOSIS — I120 Hypertensive chronic kidney disease with stage 5 chronic kidney disease or end stage renal disease: Secondary | ICD-10-CM | POA: Insufficient documentation

## 2011-04-27 DIAGNOSIS — E119 Type 2 diabetes mellitus without complications: Secondary | ICD-10-CM | POA: Insufficient documentation

## 2011-04-27 MED ORDER — HEPARIN SODIUM (PORCINE) 1000 UNIT/ML DIALYSIS
1000.0000 [IU] | INTRAMUSCULAR | Status: DC | PRN
  Filled 2011-04-27: qty 1

## 2011-04-27 MED ORDER — PENTAFLUOROPROP-TETRAFLUOROETH EX AERO: 1.0000 "application " | INHALATION_SPRAY | CUTANEOUS | Status: DC | PRN

## 2011-04-27 MED ORDER — ALTEPLASE 2 MG IJ SOLR
2.0000 mg | Freq: Once | INTRAMUSCULAR | Status: AC | PRN
  Filled 2011-04-27: qty 2

## 2011-04-27 MED ORDER — NEPRO/CARBSTEADY PO LIQD: 237.0000 mL | ORAL | Status: DC | PRN

## 2011-04-27 MED ORDER — SODIUM CHLORIDE 0.9 % IV SOLN: 100.0000 mL | INTRAVENOUS | Status: DC | PRN

## 2011-04-27 MED ORDER — HEPARIN SODIUM (PORCINE) 1000 UNIT/ML DIALYSIS
20.0000 [IU]/kg | INTRAMUSCULAR | Status: DC | PRN
  Administered 2011-04-28: 1200 [IU] via INTRAVENOUS_CENTRAL
  Filled 2011-04-27: qty 2

## 2011-04-27 MED ORDER — LIDOCAINE HCL (PF) 1 % IJ SOLN: 5.0000 mL | INTRAMUSCULAR | Status: DC | PRN

## 2011-04-27 MED ORDER — LIDOCAINE-PRILOCAINE 2.5-2.5 % EX CREA: 1.0000 "application " | TOPICAL_CREAM | CUTANEOUS | Status: DC | PRN

## 2011-04-27 NOTE — ED Notes (Signed)
Pt stated that she would not be going to [id number], that she would be going straight to dialysis and back. Pt states that she refuses to be admitted and that we need to talk to her MD. Barbara Cower, charge RN explained to pt that it was the MD's orders to be admitted and that then she would go to dialysis.  Dr Lavera Guise explained to pt that she would have to be admitted. Pt became extremely upset and began yelling. Pt stated that she would be calling medicare and telling them that [facility] was just trying to steal money.

## 2011-04-27 NOTE — ED Notes (Signed)
Pt sitting up watching TV. NAD. Pt eating meal tray

## 2011-04-27 NOTE — ED Notes (Signed)
Dr frazier with renal sitting in watitng room with patient for consult at this time, will triage when he is finished

## 2011-04-27 NOTE — H&P (Signed)
History and Physical       Hospital Admission Note Date: 04/27/2011  Patient name: Alexandra [name] Medical record number: [id number] Date of birth: 1945-11-08 Age: 66 y.o. Gender: female PCP: [name], MD, MD  Attending physician: Marlin Canary, DO   Chief Complaint:  "I am here for my dialysis"  HPI: Patient is a 66 year old African American female with history of end-stage renal disease on hemodialysis on Tuesdays, Thursdays and Saturdays hypertension and diabetes presented to the emergency room for her regular dialysis. Patient otherwise denied any history of chest pain or shortness of breath, fevers, chills or any rigors. She denied any history of abdominal pain, nausea, vomiting, diarrhea or hematochezia. Patient was already seen by nephrologist Dr. Bascom Levels and had written hemodialysis orders. She is a little angry today because she cannot go to the HD unit right away.   Review of Systems:  Constitutional: Denies fever, chills, diaphoresis, appetite change and fatigue.  HEENT: Denies photophobia, eye pain, redness, hearing loss, ear pain, congestion, sore throat, rhinorrhea, sneezing, mouth sores, trouble swallowing, neck pain, neck stiffness and tinnitus.   Respiratory: Denies SOB, DOE, cough, chest tightness,  and wheezing.   Cardiovascular: Denies chest pain, palpitations and leg swelling.  Gastrointestinal: Denies nausea, vomiting, abdominal pain, diarrhea, constipation, blood in stool and abdominal distention.  Genitourinary: Denies dysuria, urgency, frequency, hematuria, flank pain and difficulty urinating.  Musculoskeletal: Denies myalgias, back pain, joint swelling, arthralgias and gait problem.  Skin: Denies pallor, rash and wound.  Neurological: Denies dizziness, seizures, syncope, weakness, light-headedness, numbness and headaches.  Hematological: Denies adenopathy. Easy bruising, personal or family bleeding  history  Psychiatric/Behavioral: Denies suicidal ideation, mood changes, confusion, nervousness, sleep disturbance and agitation  Past Medical History: Past Medical History  Diagnosis Date  . Chronic kidney disease     esrd  . Diabetes mellitus   . Hyperlipidemia   . Hypertension   . Renal disorder   . Dialysis care     tues, thurs, sat  . Gout due to renal impairment    Past Surgical History  Procedure Date  . Carotid endarterectomy 2002    left  . Btl   . Rectal fissurectomy   . Av fistula placement 10/30/2010    right brachiocephalic   . Fistulogram     Medications: Prior to Admission medications   Medication Sig Start Date End Date Taking? Authorizing Provider  allopurinol (ZYLOPRIM) 100 MG tablet Take 100 mg by mouth daily.     Yes Historical Provider, MD  amLODipine (NORVASC) 5 MG tablet Take 5 mg by mouth at bedtime.    Yes Historical Provider, MD  calcium acetate (PHOSLO) 667 MG capsule Take 1,334 mg by mouth 3 (three) times daily with meals.    Yes Historical Provider, MD  Coenzyme Q10 150 MG CAPS Take 300 mg by mouth daily.    Yes Historical Provider, MD  docusate sodium (COLACE) 100 MG capsule Take 200 mg by mouth at bedtime.     Yes Historical Provider, MD  hydrALAZINE (APRESOLINE) 25 MG tablet Take 75 mg by mouth 3 (three) times daily.    Yes Historical Provider, MD  l-methylfolate-B6-B12 (METANX) 3-35-2 MG TABS Take 1 tablet by mouth 2 (two) times daily.    Yes Historical Provider, MD  losartan (COZAAR) 50 MG tablet Take 50 mg by mouth at bedtime.    Yes Historical Provider, MD  metoprolol (TOPROL-XL) 50 MG 24 hr tablet Take 50 mg by mouth at bedtime.    Yes Historical  Provider, MD  multivitamin (RENA-VIT) TABS tablet Take 1 tablet by mouth daily.     Yes Historical Provider, MD  repaglinide (PRANDIN) 0.5 MG tablet Take 0.5 mg by mouth 3 (three) times daily before meals.     Yes Historical Provider, MD  colchicine 0.6 MG tablet Take 0.6 mg by mouth daily as  needed. For gout.    Historical Provider, MD    Allergies:   Allergies  Allergen Reactions  . Codeine Other (See Comments)    Passes out  . Epinephrine Other (See Comments)    Tachycardia, diaphoresis, syncope  . Percocet (Oxycodone-Acetaminophen) Other (See Comments)    Passes  out    Social History:  reports that she has been smoking Cigarettes.  She has a 50 pack-year smoking history. She has never used smokeless tobacco. She reports that she does not drink alcohol or use illicit drugs.  Family History: Family History  Problem Relation Age of Onset  . COPD Mother   . Kidney disease Mother   . Heart disease Father   . Lymphoma Son     Physical Exam: Blood pressure 178/64, pulse 77, temperature 97.7 F (36.5 C), temperature source Oral, resp. rate 16, SpO2 98.00%. General: Alert, awake, oriented x3, in no acute distress. HEENT: anicteric sclera, pink conjunctiva, pupils equal and reactive to light and accomodation Neck: supple, no masses or lymphadenopathy, no goiter, no bruits  Heart: Regular rate and rhythm, without murmurs, rubs or gallops. Lungs: Clear to auscultation bilaterally, no wheezing, rales or rhonchi. Abdomen: Soft, nontender, nondistended, positive bowel sounds, no masses. Extremities: No clubbing, cyanosis or edema with positive pedal pulses. Neuro: Grossly intact, no focal neurological deficits, strength 5/5 upper and lower extremities bilaterally Psych: alert and oriented x 3, normal mood and affect Skin: no rashes or lesions, warm and dry   LABS on Admission:  Basic Metabolic Panel:  Lab 04/24/11 1610 04/22/11 1616  NA 138 138  K 4.0 4.8  CL 97 97  CO2 28 26  GLUCOSE 155* 133*  BUN 57* 53*  CREATININE 7.61* 7.65*  CALCIUM 9.9 10.2  MG 2.4 --  PHOS 2.4 --   Liver Function Tests:  Lab 04/24/11 1831 04/22/11 1616  AST -- --  ALT -- --  ALKPHOS -- --  BILITOT -- --  PROT -- --  ALBUMIN 3.4* 3.6   CBC:  Lab 04/24/11 1831 04/22/11  1616  WBC 6.5 6.9  NEUTROABS 3.3 --  HGB 11.4* 11.9*  HCT 34.7* 36.5  MCV 89.2 --  PLT 163 163   CBG: No results found for this basename: GLUCAP:2 in the last 168 hours   Radiological Exams on Admission: No results found.  Assessment/Plan 66 year old female with PMH significant for ESRD, DM, Hypertension on dialysis, (Tue, Thurs, Sat) presents for dialysis   End stage renal disease on Hemodialysis: - Patient here for her scheduled HD today. Dr Bascom Levels has already consulted and placed in hemodialysis orders - clinically stable with no signs of fluid overload or any chest pain  HTN (hypertension) :Stable continue home meds   DM (diabetes mellitus) : Place on sliding scale insulin  Secondary hyperparathyroidism : Per renal service   History of gout: Currently stable, on PRN colchicine  DVT prophylaxis : SCDs   Disposition: Patient likely to be discharged home after the hemodialysis   Etta Gassett M.D. [facility] 04/27/2011, 4:13 PM

## 2011-04-27 NOTE — ED Notes (Signed)
Dr. Bascom Levels was sitting and speaking with pt until now

## 2011-04-27 NOTE — ED Notes (Signed)
Pt is here for dialysis.  No CP or sob.  Dr. Bascom Levels was here with her

## 2011-04-27 NOTE — ED Notes (Signed)
Placed call for diet tray 

## 2011-04-27 NOTE — ED Notes (Signed)
Dinner tray ordered.

## 2011-04-27 NOTE — ED Provider Notes (Signed)
History     CSN: [id number]  Arrival date & time 04/27/11  1450   None   Chief complaint"I'm here for dialysis"  Chief Complaint  Patient presents with  . Vascular Access Problem    pt needs dialysis and is here for this    (Consider location/radiation/quality/duration/timing/severity/associated sxs/prior treatment) HPI Patient asymptomatic presents for hemodialysis. Patient has no formal arrangement with any dialysis clinic. Comes to emergency department for  Dialysis on an as-needed basis Past Medical History  Diagnosis Date  . Chronic kidney disease     esrd  . Diabetes mellitus   . Hyperlipidemia   . Hypertension   . Renal disorder   . Dialysis care     tues, thurs, sat  . 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 Carotid endarterectomy 2002    left  . Btl   . Rectal fissurectomy   . Av fistula placement 10/30/2010    right brachiocephalic   . Fistulogram     Family History  Problem Relation Age of Onset  . COPD Mother   . Kidney disease Mother   . Heart disease Father   . Lymphoma Son     History  Substance Use Topics  . Smoking status: Current Everyday Smoker -- 1.0 packs/day for 50 years    Types: Cigarettes  . Smokeless tobacco: Never Used  . Alcohol Use: No    OB History    Grav Para Term Preterm Abortions TAB SAB Ect Mult Living                  Review of Systems  Constitutional: Negative.   HENT: Negative.   Respiratory: Negative.   Cardiovascular: Negative.   Gastrointestinal: Negative.   Musculoskeletal: Negative.   Skin: Negative.   Neurological: Negative.   Hematological: Negative.   Psychiatric/Behavioral: Negative.     Allergies  Codeine; Epinephrine; and Percocet  Home Medications   Current Outpatient Rx  Name Route Sig Dispense Refill  . ALLOPURINOL 100 MG PO TABS Oral Take 100 mg by mouth daily.      Marland Kitchen AMLODIPINE BESYLATE 5 MG PO TABS Oral Take 5 mg by mouth at bedtime.     Marland Kitchen  CALCIUM ACETATE 667 MG PO CAPS Oral Take 1,334 mg by mouth 3 (three) times daily with meals.     Marland Kitchen COENZYME Q10 150 MG PO CAPS Oral Take 300 mg by mouth daily.     Marland Kitchen DOCUSATE SODIUM 100 MG PO CAPS Oral Take 200 mg by mouth at bedtime.      Marland Kitchen HYDRALAZINE HCL 25 MG PO TABS Oral Take 75 mg by mouth 3 (three) times daily.     . L-METHYLFOLATE-B6-B12 3-35-2 MG PO TABS Oral Take 1 tablet by mouth 2 (two) times daily.     Marland Kitchen LOSARTAN POTASSIUM 50 MG PO TABS Oral Take 50 mg by mouth at bedtime.     Marland Kitchen METOPROLOL SUCCINATE ER 50 MG PO TB24 Oral Take 50 mg by mouth at bedtime.     Marland Kitchen RENA-VITE PO TABS Oral Take 1 tablet by mouth daily.      Marland Kitchen REPAGLINIDE 0.5 MG PO TABS Oral Take 0.5 mg by mouth 3 (three) times daily before meals.      . COLCHICINE 0.6 MG PO TABS Oral Take 0.6 mg by mouth daily as needed. For gout.      BP 178/64  Pulse 77  Temp(Src) 97.7 F (36.5 C) (  Oral)  Resp 16  SpO2 98%  Physical Exam  Nursing note and vitals reviewed. Constitutional: She appears well-developed and well-nourished.  HENT:  Head: Normocephalic and atraumatic.  Eyes: Conjunctivae are normal. Pupils are equal, round, and reactive to light.  Neck: Neck supple. No tracheal deviation present. No thyromegaly present.  Cardiovascular: Normal rate and regular rhythm.   No murmur heard. Pulmonary/Chest: Effort normal and breath sounds normal.       Right sided subclavian hemodialysis catheter  Abdominal: Soft. Bowel sounds are normal. She exhibits no distension. There is no tenderness.  Musculoskeletal: Normal range of motion. She exhibits no edema and no tenderness.  Neurological: She is alert. Coordination normal.  Skin: Skin is warm and dry. No rash noted.  Psychiatric: She has a normal mood and affect.    ED Course  Procedures (including critical care time)  Labs Reviewed - No data to display No results found. Spoke with Dr.laza who will arrange for admission  No diagnosis found.    MDM  Patient to  be admitted and dialysis to be arranged for later today Diagnoses end-stage renal disease        Doug Sou, MD 04/27/11 [phone]

## 2011-04-27 NOTE — Consults (Signed)
Pt. In ER she will see [facility] . I will consult for dialysis.

## 2011-04-27 NOTE — ED Notes (Signed)
Here for weekly dialysis,denies problems.

## 2011-04-28 ENCOUNTER — Inpatient Hospital Stay (HOSPITAL_COMMUNITY): Payer: Medicare HMO

## 2011-04-28 LAB — RENAL FUNCTION PANEL
BUN: 78 mg/dL — ABNORMAL HIGH (ref 6–23)
CO2: 27 mEq/L (ref 19–32)
Calcium: 10.3 mg/dL (ref 8.4–10.5)
Glucose, Bld: 100 mg/dL — ABNORMAL HIGH (ref 70–99)
Phosphorus: 3.1 mg/dL (ref 2.3–4.6)
Potassium: 4.2 mEq/L (ref 3.5–5.1)

## 2011-04-28 LAB — DIFFERENTIAL
Eosinophils Absolute: 0.2 10*3/uL (ref 0.0–0.7)
Eosinophils Relative: 2 % (ref 0–5)
Lymphs Abs: 2.9 10*3/uL (ref 0.7–4.0)
Monocytes Relative: 8 % (ref 3–12)

## 2011-04-28 LAB — CBC
Hemoglobin: 11 g/dL — ABNORMAL LOW (ref 12.0–15.0)
MCH: 29 pg (ref 26.0–34.0)
MCV: 88.4 fL (ref 78.0–100.0)
RBC: 3.79 MIL/uL — ABNORMAL LOW (ref 3.87–5.11)

## 2011-04-28 MED ORDER — RENA-VITE PO TABS
1.0000 | ORAL_TABLET | Freq: Every day | ORAL | Status: DC
  Filled 2011-04-28: qty 1

## 2011-04-28 MED ORDER — REPAGLINIDE 0.5 MG PO TABS
0.5000 mg | ORAL_TABLET | Freq: Three times a day (TID) | ORAL | Status: DC
  Filled 2011-04-28 (×4): qty 1

## 2011-04-28 MED ORDER — ALLOPURINOL 100 MG PO TABS
100.0000 mg | ORAL_TABLET | Freq: Every day | ORAL | Status: DC
  Filled 2011-04-28: qty 1

## 2011-04-28 MED ORDER — CALCIUM ACETATE 667 MG PO CAPS
1334.0000 mg | ORAL_CAPSULE | Freq: Three times a day (TID) | ORAL | Status: DC
  Filled 2011-04-28 (×4): qty 2

## 2011-04-28 MED ORDER — COLCHICINE 0.6 MG PO TABS
0.6000 mg | ORAL_TABLET | Freq: Every day | ORAL | Status: DC
  Filled 2011-04-28: qty 1

## 2011-04-28 MED ORDER — METOPROLOL SUCCINATE ER 50 MG PO TB24
50.0000 mg | ORAL_TABLET | Freq: Every day | ORAL | Status: DC
  Filled 2011-04-28: qty 1

## 2011-04-28 MED ORDER — DOCUSATE SODIUM 100 MG PO CAPS
200.0000 mg | ORAL_CAPSULE | Freq: Every day | ORAL | Status: DC
  Filled 2011-04-28: qty 2

## 2011-04-28 MED ORDER — COENZYME Q10 150 MG PO CAPS: 300.0000 mg | ORAL_CAPSULE | Freq: Every day | ORAL | Status: DC

## 2011-04-28 MED ORDER — LOSARTAN POTASSIUM 50 MG PO TABS
50.0000 mg | ORAL_TABLET | Freq: Every day | ORAL | Status: DC
  Filled 2011-04-28: qty 1

## 2011-04-28 MED ORDER — HYDRALAZINE HCL 50 MG PO TABS
75.0000 mg | ORAL_TABLET | Freq: Three times a day (TID) | ORAL | Status: DC
  Filled 2011-04-28 (×3): qty 1

## 2011-04-28 MED ORDER — L-METHYLFOLATE-B6-B12 3-35-2 MG PO TABS
1.0000 | ORAL_TABLET | Freq: Two times a day (BID) | ORAL | Status: DC
  Filled 2011-04-28 (×3): qty 1

## 2011-04-28 MED ORDER — AMLODIPINE BESYLATE 5 MG PO TABS
5.0000 mg | ORAL_TABLET | Freq: Every day | ORAL | Status: DC
  Filled 2011-04-28: qty 1

## 2011-04-28 NOTE — Progress Notes (Signed)
Patient still in waiting area of the unit talking loudly and expressing her anger about not receiving her bedtime antihypertensive medications. I explained to her that we would need to check her BP before we could give her those medications to make sure she was within the parameters.  I spoke with Dr. Bascom Levels and [facility] Coverage RN and hour ago to make them aware of situation.  I also spoke with the patient's daughter who was concerned that her pressure may be high and needed to get her medicine.  After explaining the situation to her as well, she expressed understanding of our position.  Patient is still refusing to have her pressure taken, stating that we are fraudulently charging medicare for and admission (even though I have expressed to her several times she is already admitted to our unit whether she opts to go to her room or not).  Patient was told to call if she needed anything.

## 2011-04-28 NOTE — Progress Notes (Signed)
PHARMACIST - PHYSICIAN ORDER COMMUNICATION  CONCERNING: P&T Medication Policy on Herbal Medications  DESCRIPTION:  This patient's order for:  Co-enzyme Q10 has been noted.  This product(s) is classified as an "herbal" or natural product. Due to a lack of definitive safety studies or FDA approval, nonstandard manufacturing practices, plus the potential risk of unknown drug-drug interactions while on inpatient medications, the Pharmacy and Therapeutics Committee does not permit the use of "herbal" or natural products of this type within [facility].   ACTION TAKEN: The pharmacy department is unable to verify this order at this time and your patient has been informed of this safety policy. Please reevaluate patient's clinical condition at discharge and address if the herbal or natural product(s) should be resumed at that time.   

## 2011-04-28 NOTE — Progress Notes (Signed)
Utilization Review Completed.Alexandra [name] T2/13/2013   

## 2011-04-28 NOTE — Progress Notes (Signed)
Pt arrived to floor but refused to go to her assigned room.  She stated she was to be admitted to hemodialysis not 6700.  She also let me know that she did not want to be assessed, have her blood pressure taken, or have an IV placed.  Patient is sitting in the waiting area by the nurses station and is planning to stay there until they take her to hemodialysis at approximately 2 am.  She appears very angry and is threatening to call the medicaide office to file a grievance.

## 2011-04-28 NOTE — Progress Notes (Signed)
Patient requested CBG check and a snack tray.  Both were provided.

## 2011-04-29 ENCOUNTER — Observation Stay (HOSPITAL_COMMUNITY)
Admission: EM | Admit: 2011-04-29 | Discharge: 2011-04-30 | Disposition: A | Discharge status: Home / Self Care | Payer: Medicare HMO | Attending: Internal Medicine | Admitting: Internal Medicine

## 2011-04-29 ENCOUNTER — Encounter (HOSPITAL_COMMUNITY): Payer: Self-pay | Admitting: Emergency Medicine

## 2011-04-29 DIAGNOSIS — E119 Type 2 diabetes mellitus without complications: Secondary | ICD-10-CM | POA: Insufficient documentation

## 2011-04-29 DIAGNOSIS — M109 Gout, unspecified: Secondary | ICD-10-CM | POA: Insufficient documentation

## 2011-04-29 DIAGNOSIS — N186 End stage renal disease: Secondary | ICD-10-CM | POA: Insufficient documentation

## 2011-04-29 DIAGNOSIS — I120 Hypertensive chronic kidney disease with stage 5 chronic kidney disease or end stage renal disease: Secondary | ICD-10-CM | POA: Insufficient documentation

## 2011-04-29 DIAGNOSIS — E785 Hyperlipidemia, unspecified: Secondary | ICD-10-CM | POA: Insufficient documentation

## 2011-04-29 DIAGNOSIS — N189 Chronic kidney disease, unspecified: Secondary | ICD-10-CM

## 2011-04-29 DIAGNOSIS — Z992 Dependence on renal dialysis: Secondary | ICD-10-CM | POA: Insufficient documentation

## 2011-04-29 MED ORDER — HEPARIN SODIUM (PORCINE) 1000 UNIT/ML DIALYSIS
20.0000 [IU]/kg | INTRAMUSCULAR | Status: DC | PRN
  Filled 2011-04-29: qty 2

## 2011-04-29 NOTE — ED Notes (Signed)
Pt here for dialysis.  Called hemodialysis and spoke with Sheralyn Boatman. RN.  Informed by Sheralyn Boatman pt would not be ready for dialysis until between 11:00pm-12:00am.  Pt informed and requested to speak to Casimiro Needle, RN in hemodialysis unit.  Spoke with Sheralyn Boatman, RN again and informed Casimiro Needle was not available.  This nurse left ED extension for Casimiro Needle, RN to call ED back.  Pt informed of results.

## 2011-04-29 NOTE — ED Notes (Signed)
Spoke with [name], RN.  Pt will not be able to go to  dialysis until 1:00am.  Pt informed of information.

## 2011-04-29 NOTE — H&P (Signed)
PCP:   Alexandra Floro, MD, MD   Chief Complaint:  Here for HD  HPI: [name] with h/o ESRD on HD (T/T/Sat), DM, HTN very well known to [facility]  where she is admitted every few days, gets dialyzed with Dr. Ola [name],  and is discharged same day. She was last here 2d for the same. She is  feeling well and has no complaints at all other than just needed HD.  She is eating her brunch salad and jovial. ROS otherwise negative. Dr.  Ola [name] was initially in the room seeing her as well.   Past Medical History  Diagnosis Date  . Chronic kidney disease     esrd  . Diabetes mellitus   . Hyperlipidemia   . Hypertension   . Renal disorder   . Dialysis care     tues, thurs, sat  . 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 Carotid endarterectomy 2002    left  . Btl   . Rectal fissurectomy   . Av fistula placement 10/30/2010    right brachiocephalic   . Fistulogram    Medications:  HOME MEDS: Prior to Admission medications   Medication Sig Start Date End Date Taking? Authorizing Provider  allopurinol (ZYLOPRIM) 100 MG tablet Take 100 mg by mouth daily.     Yes Historical Provider, MD  amLODipine (NORVASC) 5 MG tablet Take 5 mg by mouth at bedtime.    Yes Historical Provider, MD  calcium acetate (PHOSLO) 667 MG capsule Take 1,334 mg by mouth 3 (three) times daily with meals.    Yes Historical Provider, MD  Coenzyme Q10 150 MG CAPS Take 300 mg by mouth daily.    Yes Historical Provider, MD  colchicine 0.6 MG tablet Take 0.6 mg by mouth daily as needed. For gout.   Yes Historical Provider, MD  docusate sodium (COLACE) 100 MG capsule Take 200 mg by mouth at bedtime.     Yes Historical Provider, MD  hydrALAZINE (APRESOLINE) 25 MG tablet Take 75 mg by mouth 3 (three) times daily.    Yes Historical Provider, MD  l-methylfolate-B6-B12 (METANX) 3-35-2 MG TABS Take 1 tablet by mouth 2 (two) times daily.    Yes Historical Provider, MD  losartan (COZAAR) 50 MG tablet  Take 50 mg by mouth at bedtime.    Yes Historical Provider, MD  metoprolol (TOPROL-XL) 50 MG 24 hr tablet Take 50 mg by mouth at bedtime.    Yes Historical Provider, MD  multivitamin (RENA-VIT) TABS tablet Take 1 tablet by mouth daily.     Yes Historical Provider, MD  repaglinide (PRANDIN) 0.5 MG tablet Take 0.5 mg by mouth 3 (three) times daily before meals.     Yes Historical Provider, MD    Allergies:  Allergies  Allergen Reactions  . Codeine Other (See Comments)    Passes out  . Epinephrine Other (See Comments)    Tachycardia, diaphoresis, syncope  . Percocet (Oxycodone-Acetaminophen) Other (See Comments)    Passes  out    Social History:   reports that she has been smoking Cigarettes.  She has a 50 pack-year smoking history. She has never used smokeless tobacco. She reports that she does not drink alcohol or use illicit drugs.  Family History: Family History  Problem Relation Age of Onset  . COPD Mother   . Kidney disease Mother   . Heart disease Father   . Lymphoma Son     Physical Exam: Filed Vitals:  04/29/11 1451  BP: 171/68  Pulse: 89  Temp: 97.1 F (36.2 C)  TempSrc: Oral  SpO2: 98%   Blood pressure 171/68, pulse 89, temperature 97.1 F (36.2 C), temperature source Oral, SpO2 98.00%. Gen: Middle aged well appearing, well dressed F, smiling and joking  around, appears totally well HEENT: Pupils round and reactive, normal appearing, EOMI, sclera  clear. Mouth moist, normal Lungs: CTAB no w/c/r, good air movement Heart: Regular, not tachycardic, early peaking systolic murmur heard  at BUSB's Abd: Soft, NT ND, benign, normal exam Extrem: Warm, perfusing normally, normal exam Neuro: Alert, attentive, conversant, CN 2-12 intact without facial  droop, moves extremities spontaneously to eat her salad, grossly non- focal.  Labs & Imaging Results for orders placed during the hospital encounter of 04/27/11 (from the past 48 hour(s))  GLUCOSE, CAPILLARY      Status: Abnormal   Collection Time   04/27/11 10:46 PM      Component Value Range Comment   Glucose-Capillary 127 (*) 70 - 99 (mg/dL)   CBC     Status: Abnormal   Collection Time   04/28/11  2:33 AM      Component Value Range Comment   WBC 7.6  4.0 - 10.5 (K/uL)    RBC 3.79 (*) 3.87 - 5.11 (MIL/uL)    Hemoglobin 11.0 (*) 12.0 - 15.0 (g/dL)    HCT 40.9 (*) 81.1 - 46.0 (%)    MCV 88.4  78.0 - 100.0 (fL)    MCH 29.0  26.0 - 34.0 (pg)    MCHC 32.8  30.0 - 36.0 (g/dL)    RDW 91.4 (*) 78.2 - 15.5 (%)    Platelets 173  150 - 400 (K/uL)   DIFFERENTIAL     Status: Normal   Collection Time   04/28/11  2:33 AM      Component Value Range Comment   Neutrophils Relative 51  43 - 77 (%)    Neutro Abs 3.8  1.7 - 7.7 (K/uL)    Lymphocytes Relative 39  12 - 46 (%)    Lymphs Abs 2.9  0.7 - 4.0 (K/uL)    Monocytes Relative 8  3 - 12 (%)    Monocytes Absolute 0.6  0.1 - 1.0 (K/uL)    Eosinophils Relative 2  0 - 5 (%)    Eosinophils Absolute 0.2  0.0 - 0.7 (K/uL)    Basophils Relative 0  0 - 1 (%)    Basophils Absolute 0.0  0.0 - 0.1 (K/uL)   RENAL FUNCTION PANEL     Status: Abnormal   Collection Time   04/28/11  2:34 AM      Component Value Range Comment   Sodium 140  135 - 145 (mEq/L)    Potassium 4.2  3.5 - 5.1 (mEq/L)    Chloride 97  96 - 112 (mEq/L)    CO2 27  19 - 32 (mEq/L)    Glucose, Bld 100 (*) 70 - 99 (mg/dL)    BUN 78 (*) 6 - 23 (mg/dL)    Creatinine, Ser 9.56 (*) 0.50 - 1.10 (mg/dL)    Calcium 21.3  8.4 - 10.5 (mg/dL)    Phosphorus 3.1  2.3 - 4.6 (mg/dL)    Albumin 3.5  3.5 - 5.2 (g/dL)    GFR calc non Af Amer 4 (*) >90 (mL/min)    GFR calc Af Amer 4 (*) >90 (mL/min)    No results found.  Impression [name] with ESRD on HD (T/T/Sat) presents for  regularly scheduled  dialysis.   1. ESRD on HD: Admit patient observation for HD, and she will likely  be discharged home afterwards. Dr. Ola [name] to write HD orders  2. All other issues stable, continue all home meds.  DVT  prophylaxis: Ambulation  Regular bed, MC team 1  Presumed full code   Other plans as per orders.  Alexandra [name] 04/29/2011, 4:58 PM

## 2011-04-29 NOTE — ED Provider Notes (Signed)
History     CSN: [id number]  Arrival date & time 04/29/11  1444   First MD Initiated Contact with Patient 04/29/11 1536      Chief Complaint  Patient presents with  . Vascular Access Problem    (Consider location/radiation/quality/duration/timing/severity/associated sxs/prior treatment) The history is provided by the patient and medical records.   the patient is a 66 year old, female, with chronic renal failure, who gets dialysis on Tuesday, they, Thursday, and Saturday.  She is here for her dialysis today.  She has no complaints.  She gets dialysis 2 days ago.  She called her nephrologist, and he told her to come in for dialysis today.  Past Medical History  Diagnosis Date  . Chronic kidney disease     esrd  . Diabetes mellitus   . Hyperlipidemia   . Hypertension   . Renal disorder   . Dialysis care     tues, thurs, sat  . 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 Carotid endarterectomy 2002    left  . Btl   . Rectal fissurectomy   . Av fistula placement 10/30/2010    right brachiocephalic   . Fistulogram     Family History  Problem Relation Age of Onset  . COPD Mother   . Kidney disease Mother   . Heart disease Father   . Lymphoma Son     History  Substance Use Topics  . Smoking status: Current Everyday Smoker -- 1.0 packs/day for 50 years    Types: Cigarettes  . Smokeless tobacco: Never Used  . Alcohol Use: No    OB History    Grav Para Term Preterm Abortions TAB SAB Ect Mult Living                  Review of Systems  Respiratory: Negative for cough, chest tightness and shortness of breath.   Cardiovascular: Negative for chest pain and leg swelling.  Neurological: Negative for headaches.  Psychiatric/Behavioral: Negative for confusion.  All other systems reviewed and are negative.    Allergies  Codeine; Epinephrine; and Percocet  Home Medications   Current Outpatient Rx  Name Route Sig Dispense Refill  .  ALLOPURINOL 100 MG PO TABS Oral Take 100 mg by mouth daily.      Marland Kitchen AMLODIPINE BESYLATE 5 MG PO TABS Oral Take 5 mg by mouth at bedtime.     Marland Kitchen CALCIUM ACETATE 667 MG PO CAPS Oral Take 1,334 mg by mouth 3 (three) times daily with meals.     Marland Kitchen COENZYME Q10 150 MG PO CAPS Oral Take 300 mg by mouth daily.     . COLCHICINE 0.6 MG PO TABS Oral Take 0.6 mg by mouth daily as needed. For gout.    Marland Kitchen DOCUSATE SODIUM 100 MG PO CAPS Oral Take 200 mg by mouth at bedtime.      Marland Kitchen HYDRALAZINE HCL 25 MG PO TABS Oral Take 75 mg by mouth 3 (three) times daily.     . L-METHYLFOLATE-B6-B12 3-35-2 MG PO TABS Oral Take 1 tablet by mouth 2 (two) times daily.     Marland Kitchen LOSARTAN POTASSIUM 50 MG PO TABS Oral Take 50 mg by mouth at bedtime.     Marland Kitchen METOPROLOL SUCCINATE ER 50 MG PO TB24 Oral Take 50 mg by mouth at bedtime.     Marland Kitchen RENA-VITE PO TABS Oral Take 1 tablet by mouth daily.      Marland Kitchen REPAGLINIDE 0.5  MG PO TABS Oral Take 0.5 mg by mouth 3 (three) times daily before meals.        BP 171/68  Pulse 89  Temp(Src) 97.1 F (36.2 C) (Oral)  SpO2 98%  Physical Exam  Constitutional: She is oriented to person, place, and time. She appears well-developed and well-nourished.  HENT:  Head: Normocephalic and atraumatic.  Eyes: Conjunctivae are normal. Pupils are equal, round, and reactive to light.  Neck: Normal range of motion.  Pulmonary/Chest: Effort normal.  Abdominal: She exhibits no distension.  Musculoskeletal: Normal range of motion.  Neurological: She is alert and oriented to person, place, and time.  Skin: Skin is warm and dry.  Psychiatric: She has a normal mood and affect. Judgment and thought content normal.    ED Course  Procedures (including critical care time) 66 year old female presents for dialysis without complaints.  Neuro no tests indicated in the emergency department  Labs Reviewed - No data to display No results found.   No diagnosis found.  Spoke with Dr. Kaylyn Layer. He will admit to arrange  dialysis.  MDM  Chronic renal failure No evidence of pulmonary edema or uncontrolled hypertension. Will arrange for routine dialysis.          Nicholes Stairs, MD 04/29/11 1626

## 2011-04-29 NOTE — Consult Note (Signed)
  Pt. In the ER pt. With  ESRD  . Renal consult  For dialysis. See orders.

## 2011-04-29 NOTE — ED Notes (Signed)
Place diet order

## 2011-04-29 NOTE — ED Notes (Signed)
Pt here for routine dialysis; pt denies other complaint; pt last dialysis was Tuesday

## 2011-04-30 ENCOUNTER — Observation Stay (HOSPITAL_COMMUNITY): Payer: Medicare HMO

## 2011-04-30 LAB — DIFFERENTIAL
Eosinophils Absolute: 0.2 10*3/uL (ref 0.0–0.7)
Eosinophils Relative: 2 % (ref 0–5)
Lymphs Abs: 2.5 10*3/uL (ref 0.7–4.0)
Monocytes Relative: 7 % (ref 3–12)

## 2011-04-30 LAB — RENAL FUNCTION PANEL
BUN: 60 mg/dL — ABNORMAL HIGH (ref 6–23)
Calcium: 10.2 mg/dL (ref 8.4–10.5)
Glucose, Bld: 190 mg/dL — ABNORMAL HIGH (ref 70–99)
Phosphorus: 2.6 mg/dL (ref 2.3–4.6)
Sodium: 138 mEq/L (ref 135–145)

## 2011-04-30 LAB — CBC
MCH: 29.3 pg (ref 26.0–34.0)
MCV: 88 fL (ref 78.0–100.0)
Platelets: 191 10*3/uL (ref 150–400)
RBC: 3.76 MIL/uL — ABNORMAL LOW (ref 3.87–5.11)

## 2011-04-30 NOTE — ED Notes (Signed)
Pt's dinner tray heated up and given to pt.  Pt sitting in chair with no complaints voiced.

## 2011-04-30 NOTE — ED Notes (Signed)
Called dialysis spoke with [facility], st's it will be approx 30 mins before he can take pt.  St's he will call back for report.  Pt informed of plan of care.

## 2011-05-01 ENCOUNTER — Emergency Department (HOSPITAL_COMMUNITY): Payer: Medicare HMO

## 2011-05-01 ENCOUNTER — Encounter (HOSPITAL_COMMUNITY): Payer: Self-pay | Admitting: Emergency Medicine

## 2011-05-01 ENCOUNTER — Observation Stay (HOSPITAL_COMMUNITY)
Admission: EM | Admit: 2011-05-01 | Discharge: 2011-05-01 | Disposition: A | Discharge status: Home / Self Care | Payer: Medicare HMO | Attending: Internal Medicine | Admitting: Internal Medicine

## 2011-05-01 DIAGNOSIS — M109 Gout, unspecified: Secondary | ICD-10-CM | POA: Insufficient documentation

## 2011-05-01 DIAGNOSIS — E785 Hyperlipidemia, unspecified: Secondary | ICD-10-CM | POA: Insufficient documentation

## 2011-05-01 DIAGNOSIS — N186 End stage renal disease: Secondary | ICD-10-CM | POA: Insufficient documentation

## 2011-05-01 DIAGNOSIS — I120 Hypertensive chronic kidney disease with stage 5 chronic kidney disease or end stage renal disease: Secondary | ICD-10-CM | POA: Insufficient documentation

## 2011-05-01 DIAGNOSIS — E119 Type 2 diabetes mellitus without complications: Secondary | ICD-10-CM | POA: Insufficient documentation

## 2011-05-01 DIAGNOSIS — Z992 Dependence on renal dialysis: Principal | ICD-10-CM | POA: Insufficient documentation

## 2011-05-01 LAB — DIFFERENTIAL
Basophils Absolute: 0 10*3/uL (ref 0.0–0.1)
Lymphocytes Relative: 36 % (ref 12–46)
Monocytes Absolute: 0.6 10*3/uL (ref 0.1–1.0)
Neutro Abs: 3.9 10*3/uL (ref 1.7–7.7)

## 2011-05-01 LAB — CBC
HCT: 31.7 % — ABNORMAL LOW (ref 36.0–46.0)
RDW: 16.7 % — ABNORMAL HIGH (ref 11.5–15.5)
WBC: 7.3 10*3/uL (ref 4.0–10.5)

## 2011-05-01 LAB — RENAL FUNCTION PANEL
CO2: 29 mEq/L (ref 19–32)
Chloride: 98 mEq/L (ref 96–112)
Creatinine, Ser: 6.99 mg/dL — ABNORMAL HIGH (ref 0.50–1.10)
GFR calc Af Amer: 6 mL/min — ABNORMAL LOW (ref >90)
GFR calc non Af Amer: 5 mL/min — ABNORMAL LOW (ref >90)
Sodium: 140 mEq/L (ref 135–145)

## 2011-05-01 MED ORDER — HYDRALAZINE HCL 50 MG PO TABS: 50.0000 mg | ORAL_TABLET | Freq: Three times a day (TID) | ORAL | Status: DC

## 2011-05-01 MED ORDER — AMLODIPINE BESYLATE 5 MG PO TABS: 5.0000 mg | ORAL_TABLET | Freq: Every day | ORAL | Status: DC

## 2011-05-01 MED ORDER — HEPARIN SODIUM (PORCINE) 1000 UNIT/ML DIALYSIS
20.0000 [IU]/kg | INTRAMUSCULAR | Status: DC | PRN
  Administered 2011-05-01: 1200 [IU] via INTRAVENOUS_CENTRAL
  Filled 2011-05-01: qty 2

## 2011-05-01 MED ORDER — CALCIUM ACETATE 667 MG PO CAPS: 1334.0000 mg | ORAL_CAPSULE | Freq: Three times a day (TID) | ORAL | Status: DC

## 2011-05-01 MED ORDER — METOPROLOL SUCCINATE ER 50 MG PO TB24: 50.0000 mg | ORAL_TABLET | Freq: Every day | ORAL | Status: DC

## 2011-05-01 MED ORDER — L-METHYLFOLATE-B6-B12 3-35-2 MG PO TABS: 1.0000 | ORAL_TABLET | Freq: Two times a day (BID) | ORAL | Status: DC

## 2011-05-01 MED ORDER — LOSARTAN POTASSIUM 50 MG PO TABS: 50.0000 mg | ORAL_TABLET | Freq: Every day | ORAL | Status: DC

## 2011-05-01 MED ORDER — DOCUSATE SODIUM 100 MG PO CAPS: 200.0000 mg | ORAL_CAPSULE | Freq: Every day | ORAL | Status: DC

## 2011-05-01 MED ORDER — REPAGLINIDE 0.5 MG PO TABS: 0.5000 mg | ORAL_TABLET | Freq: Three times a day (TID) | ORAL | Status: DC

## 2011-05-01 NOTE — ED Notes (Signed)
Patient sleeping with NAD at this time. 

## 2011-05-01 NOTE — ED Notes (Signed)
[id number] Ready 

## 2011-05-01 NOTE — ED Notes (Signed)
Patient resting watching TV and reading newspaper while waiting for dialysis.

## 2011-05-01 NOTE — ED Notes (Signed)
Dialysis Dept called for patient. Patient being transported to dialysis at this time.

## 2011-05-01 NOTE — ED Provider Notes (Signed)
History     CSN: [id number]  Arrival date & time 05/01/11  1441   First MD Initiated Contact with Patient 05/01/11 1502      Chief Complaint  Patient presents with  . Vascular Access Problem    The history is provided by the patient and medical records.   the patient presents for dialysis today.  She dialyzes Tuesday Thursday Saturdays.  She last dialyzed on Thursday.  She spoke with her nephrologist who asked her to the ER for treatment.  She always gets dialysis through the hospital and always presents to the emergency department.  She denies shortness of breath today she is without any complaints.  Past Medical History  Diagnosis Date  . Chronic kidney disease     esrd  . Diabetes mellitus   . Hyperlipidemia   . Hypertension   . Renal disorder   . Dialysis care     tues, thurs, sat  . 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 Carotid endarterectomy 2002    left  . Btl   . Rectal fissurectomy   . Av fistula placement 10/30/2010    right brachiocephalic   . Fistulogram     Family History  Problem Relation Age of Onset  . COPD Mother   . Kidney disease Mother   . Heart disease Father   . Lymphoma Son     History  Substance Use Topics  . Smoking status: Current Everyday Smoker -- 1.0 packs/day for 50 years    Types: Cigarettes  . Smokeless tobacco: Never Used  . Alcohol Use: No    OB History    Grav Para Term Preterm Abortions TAB SAB Ect Mult Living                  Review of Systems  All other systems reviewed and are negative.    Allergies  Codeine; Epinephrine; and Percocet  Home Medications   Current Outpatient Rx  Name Route Sig Dispense Refill  . ALLOPURINOL 100 MG PO TABS Oral Take 100 mg by mouth daily.      Marland Kitchen AMLODIPINE BESYLATE 5 MG PO TABS Oral Take 5 mg by mouth at bedtime.     Marland Kitchen CALCIUM ACETATE 667 MG PO CAPS Oral Take 1,334 mg by mouth 3 (three) times daily with meals.     Marland Kitchen COENZYME Q10 150 MG  PO CAPS Oral Take 300 mg by mouth daily.     . COLCHICINE 0.6 MG PO TABS Oral Take 0.6 mg by mouth daily as needed. For gout.    Marland Kitchen DOCUSATE SODIUM 100 MG PO CAPS Oral Take 200 mg by mouth at bedtime.      Marland Kitchen HYDRALAZINE HCL 25 MG PO TABS Oral Take 75 mg by mouth 3 (three) times daily.     . L-METHYLFOLATE-B6-B12 3-35-2 MG PO TABS Oral Take 1 tablet by mouth 2 (two) times daily.     Marland Kitchen LOSARTAN POTASSIUM 50 MG PO TABS Oral Take 50 mg by mouth at bedtime.     Marland Kitchen METOPROLOL SUCCINATE ER 50 MG PO TB24 Oral Take 50 mg by mouth at bedtime.     Marland Kitchen RENA-VITE PO TABS Oral Take 1 tablet by mouth daily.      Marland Kitchen REPAGLINIDE 0.5 MG PO TABS Oral Take 0.5 mg by mouth 3 (three) times daily before meals.        BP 170/52  Pulse 84  Temp(Src) 98  F (36.7 C) (Oral)  Resp 18  SpO2 95%  Physical Exam  Nursing note and vitals reviewed. Constitutional: She is oriented to person, place, and time. She appears well-developed and well-nourished. No distress.  HENT:  Head: Normocephalic and atraumatic.  Eyes: EOM are normal.  Neck: Normal range of motion.  Cardiovascular: Normal rate, regular rhythm and normal heart sounds.   Pulmonary/Chest: Effort normal and breath sounds normal.  Abdominal: Soft. She exhibits no distension. There is no tenderness.  Musculoskeletal: Normal range of motion.  Neurological: She is alert and oriented to person, place, and time.  Skin: Skin is warm and dry.  Psychiatric: She has a normal mood and affect. Judgment normal.    ED Course  Procedures (including critical care time)  Labs Reviewed - No data to display No results found.   No diagnosis found.    MDM  The patient will be admitted for dialysis.  The patient is  without complaint        Lyanne Co, MD 05/01/11 1505

## 2011-05-01 NOTE — Consult Note (Signed)
Pt. In ER she has need for dialysis. She will call vascular outpatient  .orders  For dialysis now.

## 2011-05-01 NOTE — ED Notes (Signed)
Patient resting in room watching TV and reading newspaper. Patients lunch has been ordered.

## 2011-05-01 NOTE — ED Notes (Signed)
Pt here for dialysis 

## 2011-05-04 ENCOUNTER — Encounter (HOSPITAL_COMMUNITY): Payer: Self-pay

## 2011-05-04 ENCOUNTER — Observation Stay (HOSPITAL_COMMUNITY)
Admission: EM | Admit: 2011-05-04 | Discharge: 2011-05-05 | Disposition: A | Discharge status: Home / Self Care | Payer: Medicare HMO | Attending: Internal Medicine | Admitting: Internal Medicine

## 2011-05-04 DIAGNOSIS — E119 Type 2 diabetes mellitus without complications: Secondary | ICD-10-CM | POA: Insufficient documentation

## 2011-05-04 DIAGNOSIS — E785 Hyperlipidemia, unspecified: Secondary | ICD-10-CM | POA: Insufficient documentation

## 2011-05-04 DIAGNOSIS — Z992 Dependence on renal dialysis: Secondary | ICD-10-CM | POA: Insufficient documentation

## 2011-05-04 DIAGNOSIS — N186 End stage renal disease: Secondary | ICD-10-CM

## 2011-05-04 DIAGNOSIS — I120 Hypertensive chronic kidney disease with stage 5 chronic kidney disease or end stage renal disease: Secondary | ICD-10-CM | POA: Insufficient documentation

## 2011-05-04 DIAGNOSIS — M109 Gout, unspecified: Secondary | ICD-10-CM | POA: Insufficient documentation

## 2011-05-04 LAB — GLUCOSE, CAPILLARY: Glucose-Capillary: 180 mg/dL — ABNORMAL HIGH (ref 70–99)

## 2011-05-04 MED ORDER — HEPARIN SODIUM (PORCINE) 1000 UNIT/ML DIALYSIS
20.0000 [IU]/kg | INTRAMUSCULAR | Status: DC | PRN
  Administered 2011-05-05: 1200 [IU] via INTRAVENOUS_CENTRAL
  Filled 2011-05-04: qty 2

## 2011-05-04 MED ORDER — PARICALCITOL 5 MCG/ML IV SOLN
1.0000 ug | INTRAVENOUS | Status: DC
  Administered 2011-05-05: 1 ug via INTRAVENOUS
  Filled 2011-05-04: qty 0.2

## 2011-05-04 NOTE — ED Notes (Signed)
Dr. Jeri Cos was contacted per Heriberto Antigua.

## 2011-05-04 NOTE — ED Notes (Signed)
Spoke with dr. Ethelda Chick about the patient who says she has to be admitted by hospitalist. He is aware of the same and reports will contact hospitalist for admission

## 2011-05-04 NOTE — ED Notes (Signed)
Notified patient that there will be delay in her getting to dialysis. Pt requesting to page dr. Bascom Levels so she can return for dialysis at a specified time. Awaiting return call at this time

## 2011-05-04 NOTE — H&P (Signed)
PCP:   Alexandra Floro, MD, MD   Chief Complaint:  Needs HD   HPI: [name] with h/o ESRD on HD (T/T/Sat), DM, HTN very well known to [facility]  where she is admitted every few days, gets dialyzed with Dr. Ola Spurr,  and is discharged same day. She was last admitted by me last Thursday for exact same.   She is feeling well and has no complaints at all other than just needed HD.  ROS otherwise negative. Dr. Ola Spurr has already seen the pt and written HD orders   Past Medical History  Diagnosis Date  . Chronic kidney disease     esrd  . Diabetes mellitus   . Hyperlipidemia   . Hypertension   . Renal disorder   . Dialysis care     tues, thurs, sat  . 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 Carotid endarterectomy 2002    left  . Btl   . Rectal fissurectomy   . Av fistula placement 10/30/2010    right brachiocephalic   . Fistulogram    Medications:  HOME MEDS: Prior to Admission medications   Medication Sig Start Date End Date Taking? Authorizing Provider  allopurinol (ZYLOPRIM) 100 MG tablet Take 100 mg by mouth daily.     Yes Historical Provider, MD  amLODipine (NORVASC) 5 MG tablet Take 5 mg by mouth at bedtime.    Yes Historical Provider, MD  calcium acetate (PHOSLO) 667 MG capsule Take 1,334 mg by mouth 3 (three) times daily with meals.    Yes Historical Provider, MD  Coenzyme Q10 150 MG CAPS Take 300 mg by mouth daily.    Yes Historical Provider, MD  colchicine 0.6 MG tablet Take 0.6 mg by mouth daily as needed. For gout.   Yes Historical Provider, MD  docusate sodium (COLACE) 100 MG capsule Take 200 mg by mouth at bedtime.     Yes Historical Provider, MD  hydrALAZINE (APRESOLINE) 25 MG tablet Take 50 mg by mouth 3 (three) times daily.    Yes Historical Provider, MD  l-methylfolate-B6-B12 (METANX) 3-35-2 MG TABS Take 1 tablet by mouth 2 (two) times daily.    Yes Historical Provider, MD  losartan (COZAAR) 50 MG tablet Take 50 mg by mouth  at bedtime.    Yes Historical Provider, MD  metoprolol (TOPROL-XL) 50 MG 24 hr tablet Take 50 mg by mouth at bedtime.    Yes Historical Provider, MD  multivitamin (RENA-VIT) TABS tablet Take 1 tablet by mouth daily.     Yes Historical Provider, MD  repaglinide (PRANDIN) 0.5 MG tablet Take 0.5 mg by mouth 3 (three) times daily before meals.     Yes Historical Provider, MD    Allergies:  Allergies  Allergen Reactions  . Codeine Other (See Comments)    Passes out  . Epinephrine Other (See Comments)    Tachycardia, diaphoresis, syncope  . Percocet (Oxycodone-Acetaminophen) Other (See Comments)    Passes  out    Social History:   reports that she has been smoking Cigarettes.  She has a 50 pack-year smoking history. She has never used smokeless tobacco. She reports that she does not drink alcohol or use illicit drugs.  Family History: Family History  Problem Relation Age of Onset  . COPD Mother   . Kidney disease Mother   . Heart disease Father   . Lymphoma Son     Physical Exam: Filed Vitals:   05/04/11 1509 05/04/11  1510  BP: 167/57   Pulse: 79   Temp: 97.6 F (36.4 C)   TempSrc: Oral   Resp: 16   Height:  5' 8.11" (1.73 m)  Weight:  60 kg (132 lb 4.4 oz)  SpO2: 98%    Blood pressure 167/57, pulse 79, temperature 97.6 F (36.4 C), temperature source Oral, resp. rate 16, height 5' 8.11" (1.73 m), weight 60 kg (132 lb 4.4 oz), SpO2 98.00%. Gen: Middle aged well appearing, well dressed F, smiling and joking  around, appears totally well  HEENT: Pupils round and reactive, normal appearing, EOMI, sclera  clear. Mouth moist, normal  Lungs: CTAB no w/c/r, good air movement  Heart: Regular, not tachycardic, early peaking systolic murmur heard  at BUSB's  Abd: Soft, NT ND, benign, normal exam  Extrem: Warm, perfusing normally, normal exam  Neuro: Alert, attentive, conversant, CN 2-12 intact without facial  droop, moves extremities spontaneously and reading a book on arrival    Labs & Imaging Results for orders placed during the hospital encounter of 05/04/11 (from the past 48 hour(s))  GLUCOSE, CAPILLARY     Status: Abnormal   Collection Time   05/04/11  7:07 PM      Component Value Range Comment   Glucose-Capillary 180 (*) 70 - 99 (mg/dL)    No results found.  Impression Present on Admission:  .End stage renal disease  [name] with ESRD on HD (T/T/Sat) presents for regularly scheduled  dialysis.   1. ESRD on HD: Admit patient observation for HD, and she will likely  be discharged home afterwards. Dr. Ola Spurr to write HD orders   2. All other issues stable, continue all home meds.  Other plans as per orders.  Ulysees Robarts 05/04/2011, 8:04 PM

## 2011-05-04 NOTE — ED Notes (Signed)
Spoke with dr. Bascom Levels, who spoke with dialysis and the patient is next. Notified the patient of the same

## 2011-05-04 NOTE — ED Notes (Signed)
Here for dialysis, Dr. Bascom Levels has been down to see her

## 2011-05-04 NOTE — ED Provider Notes (Signed)
History     CSN: [id number]  Arrival date & time 05/04/11  1445   First MD Initiated Contact with Patient 05/04/11 1931      Chief Complaint  Patient presents with  . Vascular Access Problem    (Consider location/radiation/quality/duration/timing/severity/associated sxs/prior treatment) HPI Patient here for hemodialysis. Patient asymptomatic reports last dialysis was 3 days ago no other complaint. Seen by Dr. Leretha Dykes earlier today dialysis orders written. No other treatment prior to coming here Past Medical History  Diagnosis Date  . Chronic kidney disease     esrd  . Diabetes mellitus   . Hyperlipidemia   . Hypertension   . Renal disorder   . Dialysis care     tues, thurs, sat  . 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 Carotid endarterectomy 2002    left  . Btl   . Rectal fissurectomy   . Av fistula placement 10/30/2010    right brachiocephalic   . Fistulogram     Family History  Problem Relation Age of Onset  . COPD Mother   . Kidney disease Mother   . Heart disease Father   . Lymphoma Son     History  Substance Use Topics  . Smoking status: Current Everyday Smoker -- 1.0 packs/day for 50 years    Types: Cigarettes  . Smokeless tobacco: Never Used  . Alcohol Use: No    OB History    Grav Para Term Preterm Abortions TAB SAB Ect Mult Living                  Review of Systems  Constitutional: Negative.   HENT: Negative.   Respiratory: Negative.   Cardiovascular: Negative.   Gastrointestinal: Negative.   Musculoskeletal: Negative.   Skin: Negative.   Neurological: Negative.   Hematological: Negative.   Psychiatric/Behavioral: Negative.   All other systems reviewed and are negative.    Allergies  Codeine; Epinephrine; and Percocet  Home Medications   Current Outpatient Rx  Name Route Sig Dispense Refill  . ALLOPURINOL 100 MG PO TABS Oral Take 100 mg by mouth daily.      Marland Kitchen AMLODIPINE BESYLATE 5 MG PO  TABS Oral Take 5 mg by mouth at bedtime.     Marland Kitchen CALCIUM ACETATE 667 MG PO CAPS Oral Take 1,334 mg by mouth 3 (three) times daily with meals.     Marland Kitchen COENZYME Q10 150 MG PO CAPS Oral Take 300 mg by mouth daily.     . COLCHICINE 0.6 MG PO TABS Oral Take 0.6 mg by mouth daily as needed. For gout.    Marland Kitchen DOCUSATE SODIUM 100 MG PO CAPS Oral Take 200 mg by mouth at bedtime.      Marland Kitchen HYDRALAZINE HCL 25 MG PO TABS Oral Take 50 mg by mouth 3 (three) times daily.     . L-METHYLFOLATE-B6-B12 3-35-2 MG PO TABS Oral Take 1 tablet by mouth 2 (two) times daily.     Marland Kitchen LOSARTAN POTASSIUM 50 MG PO TABS Oral Take 50 mg by mouth at bedtime.     Marland Kitchen METOPROLOL SUCCINATE ER 50 MG PO TB24 Oral Take 50 mg by mouth at bedtime.     Marland Kitchen RENA-VITE PO TABS Oral Take 1 tablet by mouth daily.      Marland Kitchen REPAGLINIDE 0.5 MG PO TABS Oral Take 0.5 mg by mouth 3 (three) times daily before meals.        BP 167/57  Pulse 79  Temp(Src) 97.6 F (36.4 C) (Oral)  Resp 16  Ht 5' 8.11" (1.73 m)  Wt 132 lb 4.4 oz (60 kg)  BMI 20.05 kg/m2  SpO2 98%  Physical Exam  Nursing note and vitals reviewed. Constitutional: She appears well-developed and well-nourished.  HENT:  Head: Normocephalic and atraumatic.  Eyes: Conjunctivae are normal. Pupils are equal, round, and reactive to light.  Neck: Neck supple. No tracheal deviation present. No thyromegaly present.  Cardiovascular: Normal rate and regular rhythm.   No murmur heard. Pulmonary/Chest: Effort normal and breath sounds normal.       Right-sided subclavian catheter in place  Abdominal: Soft. Bowel sounds are normal. She exhibits no distension. There is no tenderness.  Musculoskeletal: Normal range of motion. She exhibits no edema and no tenderness.  Neurological: She is alert. Coordination normal.  Skin: Skin is warm and dry. No rash noted.  Psychiatric: She has a normal mood and affect.    ED Course  Procedures (including critical care time)  Labs Reviewed  GLUCOSE, CAPILLARY -  Abnormal; Notable for the following:    Glucose-Capillary 180 (*)    All other components within normal limits   No results found.   No diagnosis found.   Spoke with hospitalist M.D. who accepts patient is 23 hour observation for dialysis MDM  Plan patient to get hemodialysis, suspect will get disposition home after dialysis He knows end-stage renal disease        Doug Sou, MD 05/04/11 2001

## 2011-05-04 NOTE — Consult Note (Signed)
  Renal consult for dialysis See the 0rders.

## 2011-05-04 NOTE — ED Notes (Signed)
Dialysis called, per pt. Request. Dialysis said it would be at least 3 hours before pt. Can be seen

## 2011-05-04 NOTE — ED Notes (Signed)
Dinner tray ordered per RN, Lawson Fiscal, Renal diet.

## 2011-05-04 NOTE — ED Notes (Signed)
Pt frustrated that she hasn't been seen by ED doc and nurse that "knows the process with her."

## 2011-05-04 NOTE — ED Notes (Signed)
Spoke with Casimiro Needle in dialysis, says he will be sometime, possibly early morning before the patient is transferred to dialysis for treatmetn. Awaiting bedplacement

## 2011-05-05 ENCOUNTER — Emergency Department (HOSPITAL_COMMUNITY): Payer: Medicare HMO

## 2011-05-05 LAB — DIFFERENTIAL
Basophils Absolute: 0 10*3/uL (ref 0.0–0.1)
Lymphocytes Relative: 37 % (ref 12–46)
Monocytes Absolute: 0.7 10*3/uL (ref 0.1–1.0)
Neutro Abs: 3.9 10*3/uL (ref 1.7–7.7)
Neutrophils Relative %: 51 % (ref 43–77)

## 2011-05-05 LAB — CBC
Hemoglobin: 10.2 g/dL — ABNORMAL LOW (ref 12.0–15.0)
MCH: 28.6 pg (ref 26.0–34.0)
RBC: 3.57 MIL/uL — ABNORMAL LOW (ref 3.87–5.11)

## 2011-05-05 LAB — RENAL FUNCTION PANEL
CO2: 25 mEq/L (ref 19–32)
Calcium: 10.3 mg/dL (ref 8.4–10.5)
Chloride: 101 mEq/L (ref 96–112)
GFR calc Af Amer: 4 mL/min — ABNORMAL LOW (ref >90)
Glucose, Bld: 67 mg/dL — ABNORMAL LOW (ref 70–99)
Sodium: 140 mEq/L (ref 135–145)

## 2011-05-05 LAB — HEPATITIS B SURFACE ANTIGEN: Hepatitis B Surface Ag: NEGATIVE

## 2011-05-05 MED ORDER — PARICALCITOL 5 MCG/ML IV SOLN
INTRAVENOUS | Status: AC
  Administered 2011-05-05: 1 ug via INTRAVENOUS
  Filled 2011-05-05: qty 1

## 2011-05-05 MED ORDER — DARBEPOETIN ALFA-POLYSORBATE 100 MCG/0.5ML IJ SOLN
INTRAMUSCULAR | Status: AC
  Administered 2011-05-05: 100 ug via INTRAVENOUS
  Filled 2011-05-05: qty 0.5

## 2011-05-05 MED ORDER — DARBEPOETIN ALFA-POLYSORBATE 100 MCG/0.5ML IJ SOLN
100.0000 ug | INTRAMUSCULAR | Status: AC
  Administered 2011-05-05: 100 ug via INTRAVENOUS
  Filled 2011-05-05: qty 0.5

## 2011-05-05 NOTE — ED Notes (Signed)
Tori, @ [phone], called to inform me that she was with a pt that arrested post dialysis and would be unavailable until approx 0200 to receive this pt - pt will be notified and Tori to be called back with pt's decision to stay or go home

## 2011-05-05 NOTE — Progress Notes (Signed)
Utilization Review Completed.Adnan Vanvoorhis T2/20/2013   

## 2011-05-05 NOTE — ED Notes (Signed)
In to inform pt that they would be unable to take her to dialysis until 0200 as previously noted; pt stated "This is bull shit"; pt states to get her "older nurse who knew what was going on"; informed pt that I was currently taking care of her and that I was well aware of her status and her plans to go to dialysis when they could accept her; pt upset that this nurse turned TV off while discussing plan of care; stated that I "better cut it back on after we talked"; informed pt that I would do so; again, asked pt if she wanted to wait for dialysis at 0200 or return later; pt stated "You tell Tori that I will wait until 2 am!"; Tori notified of same

## 2011-05-08 ENCOUNTER — Observation Stay (HOSPITAL_COMMUNITY)
Admission: EM | Admit: 2011-05-08 | Discharge: 2011-05-09 | Disposition: A | Discharge status: Home / Self Care | Payer: Medicare HMO | Attending: Internal Medicine | Admitting: Internal Medicine

## 2011-05-08 ENCOUNTER — Emergency Department (HOSPITAL_COMMUNITY): Admit: 2011-05-08 | Discharge: 2011-05-08 | Disposition: A | Discharge status: Home / Self Care | Payer: Medicare HMO

## 2011-05-08 ENCOUNTER — Encounter (HOSPITAL_COMMUNITY): Payer: Self-pay | Admitting: Emergency Medicine

## 2011-05-08 DIAGNOSIS — F17200 Nicotine dependence, unspecified, uncomplicated: Secondary | ICD-10-CM | POA: Insufficient documentation

## 2011-05-08 DIAGNOSIS — Z992 Dependence on renal dialysis: Secondary | ICD-10-CM | POA: Insufficient documentation

## 2011-05-08 DIAGNOSIS — I120 Hypertensive chronic kidney disease with stage 5 chronic kidney disease or end stage renal disease: Secondary | ICD-10-CM | POA: Insufficient documentation

## 2011-05-08 DIAGNOSIS — D638 Anemia in other chronic diseases classified elsewhere: Secondary | ICD-10-CM | POA: Diagnosis present

## 2011-05-08 DIAGNOSIS — N186 End stage renal disease: Secondary | ICD-10-CM | POA: Insufficient documentation

## 2011-05-08 DIAGNOSIS — M109 Gout, unspecified: Secondary | ICD-10-CM | POA: Insufficient documentation

## 2011-05-08 DIAGNOSIS — E785 Hyperlipidemia, unspecified: Secondary | ICD-10-CM | POA: Insufficient documentation

## 2011-05-08 LAB — CBC
HCT: 31.6 % — ABNORMAL LOW (ref 36.0–46.0)
Hemoglobin: 10.5 g/dL — ABNORMAL LOW (ref 12.0–15.0)
MCH: 29.1 pg (ref 26.0–34.0)
MCHC: 33.2 g/dL (ref 30.0–36.0)
MCV: 87.5 fL (ref 78.0–100.0)
RDW: 17 % — ABNORMAL HIGH (ref 11.5–15.5)

## 2011-05-08 LAB — DIFFERENTIAL
Basophils Absolute: 0 10*3/uL (ref 0.0–0.1)
Basophils Relative: 0 % (ref 0–1)
Eosinophils Absolute: 0.2 10*3/uL (ref 0.0–0.7)
Eosinophils Relative: 2 % (ref 0–5)
Monocytes Absolute: 0.9 10*3/uL (ref 0.1–1.0)
Monocytes Relative: 10 % (ref 3–12)

## 2011-05-08 LAB — RENAL FUNCTION PANEL
Albumin: 3.4 g/dL — ABNORMAL LOW (ref 3.5–5.2)
BUN: 76 mg/dL — ABNORMAL HIGH (ref 6–23)
Calcium: 10.3 mg/dL (ref 8.4–10.5)
Chloride: 99 mEq/L (ref 96–112)
Creatinine, Ser: 10.33 mg/dL — ABNORMAL HIGH (ref 0.50–1.10)
GFR calc non Af Amer: 3 mL/min — ABNORMAL LOW (ref >90)

## 2011-05-08 LAB — GLUCOSE, CAPILLARY: Glucose-Capillary: 148 mg/dL — ABNORMAL HIGH (ref 70–99)

## 2011-05-08 MED ORDER — HEPARIN SODIUM (PORCINE) 1000 UNIT/ML DIALYSIS
20.0000 [IU]/kg | INTRAMUSCULAR | Status: DC | PRN
  Filled 2011-05-08: qty 2

## 2011-05-08 MED ORDER — DARBEPOETIN ALFA-POLYSORBATE 100 MCG/0.5ML IJ SOLN: 100.0000 ug | Freq: Once | INTRAMUSCULAR | Status: DC

## 2011-05-08 MED ORDER — PARICALCITOL 5 MCG/ML IV SOLN
1.0000 ug | Freq: Once | INTRAVENOUS | Status: AC
  Administered 2011-05-08: 1 ug via INTRAVENOUS

## 2011-05-08 MED ORDER — PARICALCITOL 5 MCG/ML IV SOLN
INTRAVENOUS | Status: AC
  Administered 2011-05-08: 1 ug via INTRAVENOUS
  Filled 2011-05-08: qty 1

## 2011-05-08 NOTE — ED Notes (Signed)
Pt. Here for dialysis  

## 2011-05-08 NOTE — ED Notes (Signed)
Pt says that she does not take the medications we give her that she takes her own. Pt proceeded to take her own Prandin pil at this time.

## 2011-05-08 NOTE — H&P (Addendum)
PCP:   [name], MD, MD   Primary nephrologist: Dr. Jeri Cos  Chief Complaint:  None  HPI: 66 year old very pleasant African American female patient with history of end-stage renal disease on dialysis Tuesdays, Thursdays and Saturdays, type 2 diabetes mellitus, hypertension, hyperlipidemia, gout, ongoing tobacco abuse who has come for hemodialysis. Currently she has no arrangements for outpatient dialysis and hence comes to the hospital 3 times per week. She gets dialyzed and then leaves. She was last dialyzed on Tuesday but she says the dialysis was started quite late in the night and hence went on through Wednesday morning. She thereby did not come for dialysis on Thursday which she did discuss with her nephrologist. She denies any complaints. Patient declines any blood draws at this time.  She indicates that she is on a renal transplant list and has extensive workup done at [facility]. Transthoracic echocardiogram and stress test within the last 6 weeks apparently were negative. Colonoscopy by Dr. Carman Ching in the last 2 months was said to be negative. Pap smear through her primary care physician's office was normal. Mammogram done recently also said to be normal.   Past Medical History: Past Medical History  Diagnosis Date  . Chronic kidney disease     esrd  . Diabetes mellitus   . Hyperlipidemia   . Hypertension   . Renal disorder   . Dialysis care     tues, thurs, sat  . Gout due to renal impairment     Past Surgical History: Past Surgical History  Procedure Date  . Carotid endarterectomy 2002    left  . Btl   . Rectal fissurectomy   . Av fistula placement 10/30/2010    right brachiocephalic   . Fistulogram     Allergies:   Allergies  Allergen Reactions  . Codeine Other (See Comments)    Passes out  . Epinephrine Other (See Comments)    Tachycardia, diaphoresis, syncope  . Percocet (Oxycodone-Acetaminophen) Other (See  Comments)    Passes  out    Medications: Prior to Admission medications   Medication Sig Start Date End Date Taking? Authorizing Provider  allopurinol (ZYLOPRIM) 100 MG tablet Take 100 mg by mouth daily.     Yes Historical Provider, MD  amLODipine (NORVASC) 5 MG tablet Take 5 mg by mouth at bedtime.    Yes Historical Provider, MD  calcium acetate (PHOSLO) 667 MG capsule Take 1,334 mg by mouth 3 (three) times daily with meals.    Yes Historical Provider, MD  Coenzyme Q10 150 MG CAPS Take 300 mg by mouth daily.    Yes Historical Provider, MD  colchicine 0.6 MG tablet Take 0.6 mg by mouth daily as needed. For gout.   Yes Historical Provider, MD  docusate sodium (COLACE) 100 MG capsule Take 200 mg by mouth at bedtime.     Yes Historical Provider, MD  hydrALAZINE (APRESOLINE) 25 MG tablet Take 50 mg by mouth 3 (three) times daily.    Yes Historical Provider, MD  l-methylfolate-B6-B12 (METANX) 3-35-2 MG TABS Take 1 tablet by mouth 2 (two) times daily.    Yes Historical Provider, MD  losartan (COZAAR) 50 MG tablet Take 50 mg by mouth at bedtime.    Yes Historical Provider, MD  metoprolol (TOPROL-XL) 50 MG 24 hr tablet Take 50 mg by mouth at bedtime.    Yes Historical Provider, MD  multivitamin (RENA-VIT) TABS tablet Take 1 tablet by mouth daily.     Yes Historical  Provider, MD  repaglinide (PRANDIN) 0.5 MG tablet Take 0.5 mg by mouth 3 (three) times daily before meals.     Yes Historical Provider, MD    Family History: Family History  Problem Relation Age of Onset  . COPD Mother   . Kidney disease Mother   . Heart disease Father   . Lymphoma Son    patient's son died last year from lymphoma.  Social History:  reports that she has been smoking Cigarettes.  She has a 50 pack-year smoking history. She has never used smokeless tobacco. She reports that she does not drink alcohol or use illicit drugs. She knows that she has to quit smoking especially that she is on the transplant list and says  that she's working on it.  Review of Systems:  All systems reviewed and apart from history of presenting illness is negative. Her home blood sugar checks vary based on her dietarycompliance from 85 mg/dL fasting when she's compliant with her diet to high 100s when she's not compliant with her diet. She denies hypoglycemic episodes at home but indicates that she went hypoglycemic in the hospital the last time she was here because she was here too late in the night and didn't eat enough. On and off urinary frequency which is chronic but no dysuria or fevers or chills.  Physical Exam: Filed Vitals:   05/08/11 1446 05/08/11 1500  BP: 190/65   Pulse: 81   Temp: 97 F (36.1 C)   TempSrc: Oral   Resp: 20   Weight:  61 kg (134 lb 7.7 oz)  SpO2: 94%    General appearance: Moderately built and nourished, well dressed female patient who is sitting comfortably on a chair and is in no obvious distress. Head: Nontraumatic and normocephalic.  Eyes: Pupils equally reacting to light and accommodation.  Ears: Normal  Nose: No acute findings. No sinus tenderness.  Throat: Mucosa is moist. No oral thrush.  Neck: Supple. No JVD. Bilateral carotid bruits right greater than left. Lymph nodes: No lymphadenopathy.  Resp: Clear to auscultation. No increased work of breathing.  Cardio: First and second heart sounds heard, regular rate and rythm. No murmurs or JVD or gallop or pedal edema.   GI: Non distended. Soft and nontender. No organomegaly or masses appreciated. Normal bowel sounds heard.  Extremities: symmetric 5/5 power. Neurologic: Alert and oriented. No focal neurological deficits.   Labs on Admission:  Patient has declined blood draws and hence no labs were drawn during this admission.  On 05/05/2011 lab results were as follows: 1. BMP was significant for BUN 70, creatinine of 9.25 and blood glucose of 67 mg/dL. Calcium was 10.3 and phosphorus 2.6. 2. CBCs: Hemoglobin 10.2, hematocrit 31.1, white  blood cell 7.7 and platelets 164. Her hemoglobin has been fairly stable.   Radiological Exams on Admission: No results found.    Assessment/Plan Present on Admission:  .DM (diabetes mellitus) .HTN (hypertension) .Anemia of chronic disease  1. End-stage renal disease on hemodialysis: Nephrology has been consulted by the ED physician and have seen her. She is scheduled for dialysis sometime this afternoon. Management per nephrology. Patient indicates that she is on a transplant list. 2. Uncontrolled hypertension: Resume home medications and monitor. Patient indicates that she has her pills in her pill box and will take those rather than the ones in the hospital. 3. Type 2 diabetes mellitus: Patient declined sliding-scale insulin. Continue home Prandin and monitor CBGs closely for hypoglycemic episodes. 4. Anemia secondary to chronic kidney disease:  Stable 5. Tobacco abuse: Cessation counseled. 6. History of gout: No acute attacks presently. 7. Full code. This was discussed and confirmed with the patient.   Rose Hippler 05/08/2011, 4:38 PM

## 2011-05-08 NOTE — ED Notes (Signed)
Pt renal diet tray ordered

## 2011-05-08 NOTE — ED Notes (Signed)
Called dialysis unit to inform pt's arrival

## 2011-05-08 NOTE — Consult Note (Signed)
Pt. In ER . [facility] to admit ibuprofen will consult for dialysis.

## 2011-05-08 NOTE — ED Provider Notes (Signed)
History     CSN: [id number]  Arrival date & time 05/08/11  1439   First MD Initiated Contact with Patient 05/08/11 1507      Chief Complaint  Patient presents with  . Vascular Access Problem     The history is provided by the patient.   the patient has dialysis Tuesday Thursday Saturdays and presents today for routine dialysis and she is no longer able to obtain dialysis in the outpatient center to.  She has no complaints today.  She has no shortness of breath.  She has no nausea vomiting or diarrhea.  She reports she feels normal.  Past Medical History  Diagnosis Date  . Chronic kidney disease     esrd  . Diabetes mellitus   . Hyperlipidemia   . Hypertension   . Renal disorder   . Dialysis care     tues, thurs, sat  . 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 Carotid endarterectomy 2002    left  . Btl   . Rectal fissurectomy   . Av fistula placement 10/30/2010    right brachiocephalic   . Fistulogram     Family History  Problem Relation Age of Onset  . COPD Mother   . Kidney disease Mother   . Heart disease Father   . Lymphoma Son     History  Substance Use Topics  . Smoking status: Current Everyday Smoker -- 1.0 packs/day for 50 years    Types: Cigarettes  . Smokeless tobacco: Never Used  . Alcohol Use: No    OB History    Grav Para Term Preterm Abortions TAB SAB Ect Mult Living                  Review of Systems  All other systems reviewed and are negative.    Allergies  Codeine; Epinephrine; and Percocet  Home Medications   Current Outpatient Rx  Name Route Sig Dispense Refill  . ALLOPURINOL 100 MG PO TABS Oral Take 100 mg by mouth daily.      Marland Kitchen AMLODIPINE BESYLATE 5 MG PO TABS Oral Take 5 mg by mouth at bedtime.     Marland Kitchen CALCIUM ACETATE 667 MG PO CAPS Oral Take 1,334 mg by mouth 3 (three) times daily with meals.     Marland Kitchen COENZYME Q10 150 MG PO CAPS Oral Take 300 mg by mouth daily.     . COLCHICINE 0.6 MG PO  TABS Oral Take 0.6 mg by mouth daily as needed. For gout.    Marland Kitchen DOCUSATE SODIUM 100 MG PO CAPS Oral Take 200 mg by mouth at bedtime.      Marland Kitchen HYDRALAZINE HCL 25 MG PO TABS Oral Take 50 mg by mouth 3 (three) times daily.     . L-METHYLFOLATE-B6-B12 3-35-2 MG PO TABS Oral Take 1 tablet by mouth 2 (two) times daily.     Marland Kitchen LOSARTAN POTASSIUM 50 MG PO TABS Oral Take 50 mg by mouth at bedtime.     Marland Kitchen METOPROLOL SUCCINATE ER 50 MG PO TB24 Oral Take 50 mg by mouth at bedtime.     Marland Kitchen RENA-VITE PO TABS Oral Take 1 tablet by mouth daily.      Marland Kitchen REPAGLINIDE 0.5 MG PO TABS Oral Take 0.5 mg by mouth 3 (three) times daily before meals.        BP 190/65  Pulse 81  Temp(Src) 97 F (36.1 C) (Oral)  Resp 20  Wt 134 lb 7.7 oz (61 kg)  SpO2 94%  Physical Exam  Constitutional: She is oriented to person, place, and time. She appears well-developed and well-nourished.  HENT:  Head: Normocephalic.  Eyes: EOM are normal.  Neck: Normal range of motion.  Pulmonary/Chest: Effort normal.  Musculoskeletal: Normal range of motion.  Neurological: She is alert and oriented to person, place, and time.  Psychiatric: She has a normal mood and affect.    ED Course  Procedures (including critical care time)  Labs Reviewed - No data to display No results found.   1. End stage renal disease on dialysis       MDM  [facility] to admit for routine dialysis.  Nephrology aware        Lyanne Co, MD 05/08/11 [phone]

## 2011-05-10 NOTE — Discharge Summary (Signed)
Physician Discharge Summary  Patient ID: Alexandra [name] MRN: [id number] DOB/AGE: September 25, 1945 66 y.o.  Admit date: 05/08/2011 Discharge date: 05/09/2011  Admission Diagnoses: End stage renal disease   Discharge Diagnoses:  Principal Problem:  *End stage renal disease on dialysis Active Problems:  HTN (hypertension)  DM (diabetes mellitus)  Anemia of chronic disease   Discharged Condition: good  Hospital Course: Ms. Alexandra [name] is a  66 years old African American female with history of end-stage renal disease, type 2 diabetes mellitus, hypertension, hyperlipidemia, gout, ongoing tobacco abuse was admitted for hemodialysis. Currently she has no arrangements for outpatient dialysis and hence comes to the hospital 3 times per week on Tuesday, Thursday and Saturday. She was last dialyzed on Tuesday 05/04/2011. She underwent hemodialysis without difficulty and was discharge home in stable condition.   Consults: none  Significant Diagnostic Studies: none  Treatments: hemodialysis  Discharge Exam: Blood pressure 187/84, pulse 87, temperature 97.9 F (36.6 C), temperature source Oral, resp. rate 22, weight 60.95 kg (134 lb 5.9 oz), SpO2 98.00%.   Disposition: 01-Home or Self Care  Discharge Orders    Future Orders Please Complete By Expires   Discharge patient        Medication List  As of 05/10/2011  6:40 AM   TAKE these medications         allopurinol 100 MG tablet   Commonly known as: ZYLOPRIM   Take 100 mg by mouth daily.      amLODipine 5 MG tablet   Commonly known as: NORVASC   Take 5 mg by mouth at bedtime.      calcium acetate 667 MG capsule   Commonly known as: PHOSLO   Take 1,334 mg by mouth 3 (three) times daily with meals.      Coenzyme Q10 150 MG Caps   Take 300 mg by mouth daily.      colchicine 0.6 MG tablet   Take 0.6 mg by mouth daily as needed. For gout.      docusate sodium 100 MG capsule   Commonly known as: COLACE   Take 200 mg by mouth at  bedtime.      hydrALAZINE 25 MG tablet   Commonly known as: APRESOLINE   Take 50 mg by mouth 3 (three) times daily.      l-methylfolate-B6-B12 3-35-2 MG Tabs   Commonly known as: METANX   Take 1 tablet by mouth 2 (two) times daily.      losartan 50 MG tablet   Commonly known as: COZAAR   Take 50 mg by mouth at bedtime.      metoprolol succinate 50 MG 24 hr tablet   Commonly known as: TOPROL-XL   Take 50 mg by mouth at bedtime.      multivitamin Tabs tablet   Take 1 tablet by mouth daily.      repaglinide 0.5 MG tablet   Commonly known as: PRANDIN   Take 0.5 mg by mouth 3 (three) times daily before meals.           Follow-up Information    Follow up with [name], MD .         SignedBurnadette Peter, Katheline Brendlinger A. 05/10/2011, 6:40 AM

## 2011-05-11 ENCOUNTER — Emergency Department (HOSPITAL_COMMUNITY)
Admission: EM | Admit: 2011-05-11 | Discharge: 2011-05-11 | Discharge status: Against Medical Advice | Payer: Medicare HMO | Attending: Emergency Medicine | Admitting: Emergency Medicine

## 2011-05-11 DIAGNOSIS — F17200 Nicotine dependence, unspecified, uncomplicated: Secondary | ICD-10-CM | POA: Insufficient documentation

## 2011-05-11 DIAGNOSIS — Z992 Dependence on renal dialysis: Secondary | ICD-10-CM | POA: Insufficient documentation

## 2011-05-11 DIAGNOSIS — Z79899 Other long term (current) drug therapy: Secondary | ICD-10-CM | POA: Insufficient documentation

## 2011-05-11 DIAGNOSIS — E785 Hyperlipidemia, unspecified: Secondary | ICD-10-CM | POA: Insufficient documentation

## 2011-05-11 DIAGNOSIS — I120 Hypertensive chronic kidney disease with stage 5 chronic kidney disease or end stage renal disease: Secondary | ICD-10-CM | POA: Insufficient documentation

## 2011-05-11 DIAGNOSIS — E119 Type 2 diabetes mellitus without complications: Secondary | ICD-10-CM | POA: Insufficient documentation

## 2011-05-11 DIAGNOSIS — N186 End stage renal disease: Secondary | ICD-10-CM | POA: Insufficient documentation

## 2011-05-11 MED ORDER — HEPARIN SODIUM (PORCINE) 1000 UNIT/ML DIALYSIS: 20.0000 [IU]/kg | INTRAMUSCULAR | Status: DC | PRN

## 2011-05-11 NOTE — ED Provider Notes (Signed)
History     CSN: [id number]  Arrival date & time 05/11/11  1441   First MD Initiated Contact with Patient 05/11/11 1519      Chief Complaint  Patient presents with  . Vascular Access Problem    (Consider location/radiation/quality/duration/timing/severity/associated sxs/prior treatment) HPI  Pt is here for dialysis to see Dr. Jeri Cos. She states that she has no complaints and is here for routine dialysis. She denies swelling, lower extremity swelling or having any complaints. The patient states that Dr. Bascom Levels has already been in to see her.   Past Medical History  Diagnosis Date  . Chronic kidney disease     esrd  . Diabetes mellitus   . Hyperlipidemia   . Hypertension   . Renal disorder   . Dialysis care     tues, thurs, sat  . 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 Carotid endarterectomy 2002    left  . Btl   . Rectal fissurectomy   . Av fistula placement 10/30/2010    right brachiocephalic   . Fistulogram     Family History  Problem Relation Age of Onset  . COPD Mother   . Kidney disease Mother   . Heart disease Father   . Lymphoma Son     History  Substance Use Topics  . Smoking status: Current Everyday Smoker -- 1.0 packs/day for 50 years    Types: Cigarettes  . Smokeless tobacco: Never Used  . Alcohol Use: No    OB History    Grav Para Term Preterm Abortions TAB SAB Ect Mult Living                  Review of Systems  All other systems reviewed and are negative.    Allergies  Codeine; Epinephrine; and Percocet  Home Medications   Current Outpatient Rx  Name Route Sig Dispense Refill  . ALLOPURINOL 100 MG PO TABS Oral Take 100 mg by mouth daily.      Marland Kitchen AMLODIPINE BESYLATE 5 MG PO TABS Oral Take 5 mg by mouth at bedtime.     Marland Kitchen CALCIUM ACETATE 667 MG PO CAPS Oral Take 1,334 mg by mouth 3 (three) times daily with meals.     Marland Kitchen COENZYME Q10 150 MG PO CAPS Oral Take 300 mg by mouth daily.     .  COLCHICINE 0.6 MG PO TABS Oral Take 0.6 mg by mouth daily as needed. For gout.    Marland Kitchen DOCUSATE SODIUM 100 MG PO CAPS Oral Take 200 mg by mouth at bedtime.      Marland Kitchen HYDRALAZINE HCL 25 MG PO TABS Oral Take 50 mg by mouth 3 (three) times daily.     . L-METHYLFOLATE-B6-B12 3-35-2 MG PO TABS Oral Take 1 tablet by mouth 2 (two) times daily.     Marland Kitchen LOSARTAN POTASSIUM 50 MG PO TABS Oral Take 50 mg by mouth at bedtime.     Marland Kitchen METOPROLOL SUCCINATE ER 50 MG PO TB24 Oral Take 50 mg by mouth at bedtime.     Marland Kitchen RENA-VITE PO TABS Oral Take 1 tablet by mouth daily.      Marland Kitchen REPAGLINIDE 0.5 MG PO TABS Oral Take 0.5 mg by mouth 3 (three) times daily before meals.        BP 216/79  Pulse 98  Temp(Src) 97.7 F (36.5 C) (Oral)  Resp 20  SpO2 97%  Physical Exam  Nursing note and vitals  reviewed. Constitutional: She appears well-developed and well-nourished. No distress.  HENT:  Head: Normocephalic and atraumatic.  Eyes: Pupils are equal, round, and reactive to light.  Neck: Normal range of motion. Neck supple.  Cardiovascular: Normal rate and regular rhythm.   Pulmonary/Chest: Effort normal and breath sounds normal. She has no wheezes. She has no rales.  Abdominal: Soft.  Neurological: She is alert.  Skin: Skin is warm and dry.    ED Course  Procedures (including critical care time)  Labs Reviewed - No data to display No results found.   1. End stage renal disease on dialysis       MDM  Pt here for routine Dialysis. Dr. Bascom Levels has already seen patient and will assume care of patient.        Dorthula Matas, PA 05/11/11 1537

## 2011-05-11 NOTE — ED Notes (Signed)
Called the dialysis unit to inquire about wait time and availibility to bring pt to the unit.  Was told that due to emergent add on it would be an extended wait time.  Pt informed.

## 2011-05-11 NOTE — ED Notes (Signed)
PAGED TRH1 FOR TIA RN.

## 2011-05-11 NOTE — ED Notes (Signed)
Dr. Bascom Levels paged to call Tia

## 2011-05-11 NOTE — ED Notes (Signed)
Paged Dr. Bascom Levels per RN, Tia.

## 2011-05-11 NOTE — ED Notes (Signed)
Ordered renal diet tray for patient.

## 2011-05-11 NOTE — ED Notes (Signed)
Pt sitting in recliner with eyes closed, respirations equal and non-labored. NAD.  Will continue to monitor.

## 2011-05-11 NOTE — ED Notes (Signed)
Dr. Bascom Levels at the bedside for evaluation.  NAD.  Called dialysis to inform them that the patient was here and awaiting approval to send her to the unit.

## 2011-05-11 NOTE — Consult Note (Signed)
Pt. In ER  ESRD needs dialysis. Pt. Has 24 hour urine for creatinine clearence    Labs  Renal profile  And CBC and diff. Calculate  Creatinine clearence. In the lab.

## 2011-05-11 NOTE — Progress Notes (Signed)
Utilization review complete 

## 2011-05-11 NOTE — ED Notes (Signed)
Called dialysis unit to inquire about wait time and was informed that the pt would not be treated until approximately 4-5 am. Pt was informed and stated that she does not wish to stay.  Had Dr. Bascom Levels paged to obtain discharge instructions. I was informed that I needed to speak with internal medicine for discharge.  Paged and spoke with internal medicine who advised that the wouldn't do d/c because they did not admit. Spoke with ER doctor that stated that the pt couldn't be d/c'd from ER because she was a pt of the Dialysis unit.  Spoke with charge nurse Barbara Cower to request help with this situation.

## 2011-05-13 ENCOUNTER — Observation Stay (HOSPITAL_COMMUNITY)
Admission: EM | Admit: 2011-05-13 | Discharge: 2011-05-14 | Discharge status: Against Medical Advice | Payer: Medicare HMO | Attending: Internal Medicine | Admitting: Internal Medicine

## 2011-05-13 ENCOUNTER — Encounter (HOSPITAL_COMMUNITY): Payer: Self-pay | Admitting: Emergency Medicine

## 2011-05-13 DIAGNOSIS — D638 Anemia in other chronic diseases classified elsewhere: Secondary | ICD-10-CM | POA: Diagnosis present

## 2011-05-13 DIAGNOSIS — I120 Hypertensive chronic kidney disease with stage 5 chronic kidney disease or end stage renal disease: Secondary | ICD-10-CM | POA: Insufficient documentation

## 2011-05-13 DIAGNOSIS — E119 Type 2 diabetes mellitus without complications: Secondary | ICD-10-CM | POA: Insufficient documentation

## 2011-05-13 DIAGNOSIS — Z992 Dependence on renal dialysis: Principal | ICD-10-CM | POA: Insufficient documentation

## 2011-05-13 DIAGNOSIS — N186 End stage renal disease: Secondary | ICD-10-CM

## 2011-05-13 DIAGNOSIS — F17200 Nicotine dependence, unspecified, uncomplicated: Secondary | ICD-10-CM | POA: Insufficient documentation

## 2011-05-13 DIAGNOSIS — M109 Gout, unspecified: Secondary | ICD-10-CM | POA: Insufficient documentation

## 2011-05-13 DIAGNOSIS — Z79899 Other long term (current) drug therapy: Secondary | ICD-10-CM | POA: Insufficient documentation

## 2011-05-13 MED ORDER — HEPARIN SODIUM (PORCINE) 1000 UNIT/ML DIALYSIS
6000.0000 [IU] | INTRAMUSCULAR | Status: DC | PRN
  Administered 2011-05-14: 6000 [IU] via INTRAVENOUS_CENTRAL
  Filled 2011-05-13: qty 6

## 2011-05-13 NOTE — ED Notes (Signed)
Spoke with Marylene Land, RN in dialysis.  States that pt called up to dialysis and was unsure of the situation.  Marylene Land updated on POC for pt.  Awaiting to be seen by EDP and then by [facility] for admission orders.

## 2011-05-13 NOTE — ED Notes (Signed)
Spoke with dialysis who said that they would be ready for the pt at 2230 but that they had no orders.  Dr Jamey Ripa was called who reported that he had already written orders and that they were in dialysis.  Dialysis was called again and confirmed that they did not have orders but that Dr Jamey Ripa was in the process of writing orders.  Dialysis then said that the pt had to go through the entire ED process to be seen and admitted by [facility] prior to coming up for diaylsis.  Pt updated on POC-that it was going to be several hours before she could be seen due to high census and acuity tonight.  Pt upset-does not understand why she cannot be seen first.  Pt reassured.

## 2011-05-13 NOTE — ED Provider Notes (Signed)
Medical screening examination/treatment/procedure(s) were performed by non-physician practitioner and as supervising physician I was immediately available for consultation/collaboration.    Ruchi Stoney L Praneel Haisley, MD 05/13/11 1556 

## 2011-05-13 NOTE — ED Notes (Signed)
Pt st's she was told to come to ED for dialysis at 10pm tonight.   Pt c/o left ankle swelling.

## 2011-05-14 ENCOUNTER — Observation Stay (HOSPITAL_COMMUNITY): Payer: Medicare HMO

## 2011-05-14 ENCOUNTER — Encounter (HOSPITAL_COMMUNITY): Payer: Self-pay | Admitting: Internal Medicine

## 2011-05-14 LAB — RENAL FUNCTION PANEL
CO2: 23 mEq/L (ref 19–32)
Calcium: 10.4 mg/dL (ref 8.4–10.5)
Chloride: 99 mEq/L (ref 96–112)
GFR calc Af Amer: 3 mL/min — ABNORMAL LOW (ref >90)
GFR calc non Af Amer: 2 mL/min — ABNORMAL LOW (ref >90)
Potassium: 5.5 mEq/L — ABNORMAL HIGH (ref 3.5–5.1)
Sodium: 139 mEq/L (ref 135–145)

## 2011-05-14 LAB — CBC
Platelets: 201 10*3/uL (ref 150–400)
RDW: 18.4 % — ABNORMAL HIGH (ref 11.5–15.5)
WBC: 8.2 10*3/uL (ref 4.0–10.5)

## 2011-05-14 LAB — DIFFERENTIAL
Basophils Absolute: 0 10*3/uL (ref 0.0–0.1)
Eosinophils Relative: 3 % (ref 0–5)
Lymphocytes Relative: 33 % (ref 12–46)
Neutro Abs: 4.3 10*3/uL (ref 1.7–7.7)
Neutrophils Relative %: 53 % (ref 43–77)

## 2011-05-14 LAB — GLUCOSE, CAPILLARY: Glucose-Capillary: 106 mg/dL — ABNORMAL HIGH (ref 70–99)

## 2011-05-14 MED ORDER — PARICALCITOL 5 MCG/ML IV SOLN
1.0000 ug | Freq: Once | INTRAVENOUS | Status: AC
  Administered 2011-05-14: 1 ug via INTRAVENOUS
  Filled 2011-05-14: qty 0.2

## 2011-05-14 MED ORDER — CALCIUM ACETATE 667 MG PO CAPS
1334.0000 mg | ORAL_CAPSULE | Freq: Three times a day (TID) | ORAL | Status: DC
  Administered 2011-05-14: 1334 mg via ORAL
  Filled 2011-05-14 (×3): qty 2

## 2011-05-14 MED ORDER — DARBEPOETIN ALFA-POLYSORBATE 200 MCG/0.4ML IJ SOLN
INTRAMUSCULAR | Status: AC
  Administered 2011-05-14: 200 ug via INTRAVENOUS
  Filled 2011-05-14: qty 0.4

## 2011-05-14 MED ORDER — DARBEPOETIN ALFA-POLYSORBATE 200 MCG/0.4ML IJ SOLN
200.0000 ug | Freq: Once | INTRAMUSCULAR | Status: AC
  Administered 2011-05-14: 200 ug via INTRAVENOUS

## 2011-05-14 MED ORDER — PARICALCITOL 5 MCG/ML IV SOLN
INTRAVENOUS | Status: AC
  Administered 2011-05-14: 1 ug via INTRAVENOUS
  Filled 2011-05-14: qty 1

## 2011-05-14 NOTE — H&P (Signed)
Alexandra [name] is an 66 y.o. female.   Chief Complaint: Needs Dialysis HPI: 66 Yo well known to our service with ESRD on HD TTS who comes in 3 times a week and gets admitted for HD. Orders are written by Dr frazier. She is back again with SOB and leg swelling. She was dialyzed on Tuesday and needs to have Dialysis again today. Dr frazier notified. We are admitting to have her Dialyzed once more.  Past Medical History  Diagnosis Date  . Chronic kidney disease     esrd  . Diabetes mellitus   . Hyperlipidemia   . Hypertension   . Renal disorder   . Dialysis care     tues, thurs, sat  . 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 Carotid endarterectomy 2002    left  . Btl   . Rectal fissurectomy   . Av fistula placement 10/30/2010    right brachiocephalic   . Fistulogram     Family History  Problem Relation Age of Onset  . COPD Mother   . Kidney disease Mother   . Heart disease Father   . Lymphoma Son    Social History:  reports that she has been smoking Cigarettes.  She has a 50 pack-year smoking history. She has never used smokeless tobacco. She reports that she does not drink alcohol or use illicit drugs.  Allergies:  Allergies  Allergen Reactions  . Codeine Other (See Comments)    Passes out  . Epinephrine Other (See Comments)    Tachycardia, diaphoresis, syncope  . Percocet (Oxycodone-Acetaminophen) Other (See Comments)    Passes  out    Medications Prior to Admission  Medication Dose Route Frequency Provider Last Rate Last Dose  . heparin injection 6,000 Units  6,000 Units Dialysis PRN Lyanne Co, MD      . DISCONTD: heparin injection 1,200 Units  20 Units/kg Dialysis PRN Jarome Matin, MD       Medications Prior to Admission  Medication Sig Dispense Refill  . allopurinol (ZYLOPRIM) 100 MG tablet Take 100 mg by mouth daily.        Marland Kitchen amLODipine (NORVASC) 5 MG tablet Take 5 mg by mouth at bedtime.       . calcium  acetate (PHOSLO) 667 MG capsule Take 1,334 mg by mouth 3 (three) times daily with meals.       . Coenzyme Q10 150 MG CAPS Take 300 mg by mouth daily.       . colchicine 0.6 MG tablet Take 0.6 mg by mouth daily as needed. For gout.      . docusate sodium (COLACE) 100 MG capsule Take 200 mg by mouth at bedtime.        . hydrALAZINE (APRESOLINE) 25 MG tablet Take 50 mg by mouth 3 (three) times daily.       Marland Kitchen l-methylfolate-B6-B12 (METANX) 3-35-2 MG TABS Take 1 tablet by mouth 2 (two) times daily.       Marland Kitchen losartan (COZAAR) 50 MG tablet Take 50 mg by mouth at bedtime.       . metoprolol (TOPROL-XL) 50 MG 24 hr tablet Take 50 mg by mouth at bedtime.       . multivitamin (RENA-VIT) TABS tablet Take 1 tablet by mouth daily.        . repaglinide (PRANDIN) 0.5 MG tablet Take 0.5 mg by mouth 3 (three) times daily before meals.  Results for orders placed during the hospital encounter of 05/13/11 (from the past 48 hour(s))  GLUCOSE, CAPILLARY     Status: Abnormal   Collection Time   05/14/11 12:33 AM      Component Value Range Comment   Glucose-Capillary 106 (*) 70 - 99 (mg/dL)    No results found.  Review of Systems  Respiratory: Positive for shortness of breath.   Cardiovascular: Positive for orthopnea and leg swelling.  All other systems reviewed and are negative.    Blood pressure 206/68, pulse 93, temperature 98.2 F (36.8 C), temperature source Oral, resp. rate 18, SpO2 94.00%. Physical Exam  Nursing note and vitals reviewed. Constitutional: She appears well-developed and well-nourished.  HENT:  Head: Normocephalic and atraumatic.  Right Ear: External ear normal.  Left Ear: External ear normal.  Nose: Nose normal.  Mouth/Throat: Oropharynx is clear and moist.  Eyes: Conjunctivae and EOM are normal. Pupils are equal, round, and reactive to light.  Neck: Normal range of motion. Neck supple.  Cardiovascular: Normal rate, regular rhythm, normal heart sounds and intact distal  pulses.   Respiratory: Effort normal and breath sounds normal.  GI: Soft. Bowel sounds are normal.  Musculoskeletal: Normal range of motion.  Neurological: She is alert. She has normal reflexes.  Skin: Skin is warm and dry.  Psychiatric: She has a normal mood and affect. Her behavior is normal. Judgment and thought content normal.     Assessment/Plan 1. ESRD: For HD. Orders per Dr frazier 2. HTN: Continue home medications 3. DM2: continue home medications and SSI 4. Tobacco abuse: counseled  Alexandra [name],LAWAL 05/14/2011, 1:01 AM

## 2011-05-14 NOTE — ED Notes (Signed)
Pt has a bed but she is refusing to go upstairs, stating, "I never go upstairs".

## 2011-05-14 NOTE — Progress Notes (Signed)
Utilization review completed.  

## 2011-05-14 NOTE — ED Notes (Signed)
Pt awaiting dialysis.  Very talkative. Requesting cbg be taken.  CBG is 106.

## 2011-05-14 NOTE — ED Provider Notes (Signed)
History     CSN: [id number]  Arrival date & time 05/13/11  2128   First MD Initiated Contact with Patient 05/14/11 0016      Chief Complaint  Patient presents with  . Leg Swelling    (Consider location/radiation/quality/duration/timing/severity/associated sxs/prior treatment) The history is provided by the patient.   patient presents for her routine dialysis.  Her last dialysis was on Saturday.  She normally dialyzes Tuesday Thursday Saturday but she missed Tuesday.  She reports mild shortness of breath and is noted to be hypertensive at 206/68.  She denies chest pain.  She denies lightheadedness.  She was seen in the ER earlier today however it was going to be a long time until dialysis can get to her therefore the patient chose to go home.  She returns now requesting dialysis.  She has no new complaints. Past Medical History  Diagnosis Date  . Chronic kidney disease     esrd  . Diabetes mellitus   . Hyperlipidemia   . Hypertension   . Renal disorder   . Dialysis care     tues, thurs, sat  . 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 Carotid endarterectomy 2002    left  . Btl   . Rectal fissurectomy   . Av fistula placement 10/30/2010    right brachiocephalic   . Fistulogram     Family History  Problem Relation Age of Onset  . COPD Mother   . Kidney disease Mother   . Heart disease Father   . Lymphoma Son     History  Substance Use Topics  . Smoking status: Current Everyday Smoker -- 1.0 packs/day for 50 years    Types: Cigarettes  . Smokeless tobacco: Never Used  . Alcohol Use: No    OB History    Grav Para Term Preterm Abortions TAB SAB Ect Mult Living                  Review of Systems  All other systems reviewed and are negative.    Allergies  Codeine; Epinephrine; and Percocet  Home Medications   Current Outpatient Rx  Name Route Sig Dispense Refill  . ALLOPURINOL 100 MG PO TABS Oral Take 100 mg by mouth  daily.      Marland Kitchen AMLODIPINE BESYLATE 5 MG PO TABS Oral Take 5 mg by mouth at bedtime.     Marland Kitchen CALCIUM ACETATE 667 MG PO CAPS Oral Take 1,334 mg by mouth 3 (three) times daily with meals.     Marland Kitchen COENZYME Q10 150 MG PO CAPS Oral Take 300 mg by mouth daily.     . COLCHICINE 0.6 MG PO TABS Oral Take 0.6 mg by mouth daily as needed. For gout.    Marland Kitchen DOCUSATE SODIUM 100 MG PO CAPS Oral Take 200 mg by mouth at bedtime.      Marland Kitchen HYDRALAZINE HCL 25 MG PO TABS Oral Take 50 mg by mouth 3 (three) times daily.     . L-METHYLFOLATE-B6-B12 3-35-2 MG PO TABS Oral Take 1 tablet by mouth 2 (two) times daily.     Marland Kitchen LOSARTAN POTASSIUM 50 MG PO TABS Oral Take 50 mg by mouth at bedtime.     Marland Kitchen METOPROLOL SUCCINATE ER 50 MG PO TB24 Oral Take 50 mg by mouth at bedtime.     Marland Kitchen RENA-VITE PO TABS Oral Take 1 tablet by mouth daily.      Marland Kitchen REPAGLINIDE  0.5 MG PO TABS Oral Take 0.5 mg by mouth 3 (three) times daily before meals.        BP 206/68  Pulse 93  Temp(Src) 98.2 F (36.8 C) (Oral)  Resp 18  SpO2 94%  Physical Exam  Nursing note and vitals reviewed. Constitutional: She is oriented to person, place, and time. She appears well-developed and well-nourished. No distress.  HENT:  Head: Normocephalic and atraumatic.  Eyes: EOM are normal.  Neck: Normal range of motion.  Cardiovascular: Normal rate, regular rhythm and normal heart sounds.   Pulmonary/Chest: Effort normal and breath sounds normal.  Abdominal: Soft. She exhibits no distension. There is no tenderness.  Musculoskeletal: Normal range of motion.  Neurological: She is alert and oriented to person, place, and time.  Skin: Skin is warm and dry.  Psychiatric: She has a normal mood and affect. Judgment normal.    ED Course  Procedures (including critical care time)  Labs Reviewed - No data to display No results found.   1. End stage renal disease       MDM  Will contact triad for admission. Will need dialysis today        Lyanne Co,  MD 05/14/11 [phone]

## 2011-05-14 NOTE — ED Notes (Signed)
Spoke with Alexandra [name] at dialysis and she stated that we can send pt up around 0245 (if there are no emergencies).  Pt notified and she is quite upset.

## 2011-05-14 NOTE — Discharge Summary (Addendum)
Patient ID: Kerington Hildebrant MRN: [id number] DOB/AGE: 1945-11-11 66 y.o.  Admit date: 04/10/2011 Discharge date:1/262013  Primary Care Physician:  [name], MD, MD   This note to document that patient left the hospital after dialysis ,she was not discharged by the [facility] physician.  This discharge summary for visit dated 04/10/2011  Present on Admission:  ESRD   Diabetes mellitus  .  Hyperlipidemia  .  Hypertension   Medication  allopurinol (ZYLOPRIM) 100 MG tablet  Take 100 mg by mouth daily.  amLODipine (NORVASC) 5 MG tablet  Take 5 mg by mouth at bedtime.  calcium acetate (PHOSLO) 667 MG capsule  Take 1,334 mg by mouth 3 (three) times daily with meals.  Coenzyme Q10 150 MG CAPS  Take 300 mg by mouth daily.  colchicine 0.6 MG tablet  Take 0.6 mg by mouth daily as needed. For gout.   docusate sodium (COLACE) 100 MG capsule  Take 200 mg by mouth at bedtime.    hydrALAZINE (APRESOLINE) 25 MG tablet  Take 75 mg by mouth 3 (three) times daily.    l-methylfolate-B6-B12 (METANX) 3-35-2 MG TABS  Take 1 tablet by mouth 2 (two) times daily.   losartan (COZAAR) 50 MG tablet  Take 50 mg by mouth at bedtime.   metoprolol (TOPROL-XL) 50 MG 24 hr tablet  Take 50 mg by mouth at bedtime.    multivitamin (RENA-VIT) TABS tablet  Take 1 tablet by mouth daily.   repaglinide (PRANDIN) 0.5 MG tablet  Take 0.5 mg by mouth 3 (three) times daily before meals.     Consults:  Dr Bascom Levels    Significant Diagnostic Studies:  No results found.  Brief H and P: For complete details please refer to admission H and P, but in brief  66 years old [place] woman with end-stage renal disease on hemodialysis on Tuesdays, Thursdays and Saturdays who apparently for some reasons can't be dialyzed as an outpatient and she been coming to the ER to be dialyzed. She denies any shortness of breath or any other complaints. Dr. Bascom Levels was notified by ER and he requested that  patient be admitted by triad hospitalist and he will consult for dialysis   Hospital Course:  ESRD : Dr Bascom Levels was consulted ,patient was dialyzed the same night and apparently left the hospital after dialysis .Please note that the undersigned physician  Did not discharge this patient ,was not informed with patient departure and was not present at the time of her departure from the hospital.   Signed: Arbie Reisz 05/14/2011, 8:02 PM

## 2011-05-14 NOTE — ED Notes (Signed)
Received report from [employer]. Pt is not in any respiratory or cardiac distress. Pt is resting. Will continue to monitor.

## 2011-05-14 NOTE — ED Notes (Signed)
Pt transported to hemodialysis by Malachi Bonds, NT

## 2011-05-14 NOTE — ED Notes (Signed)
TT Alexandra [name].  As soon as he delivers pt to room, he will call to get report.

## 2011-05-14 NOTE — Progress Notes (Signed)
   CARE MANAGEMENT NOTE 05/14/2011  Patient:  Alexandra [name], Alexandra [name]   Account Number:  [id number]  Date Initiated:  05/14/2011  Documentation initiated by:  Letha Cape  Subjective/Objective Assessment:   dx ESRD  admit     Action/Plan:   Anticipated DC Date:  05/14/2011   Anticipated DC Plan:  HOME/SELF CARE      DC Planning Services  CM consult      Choice offered to / List presented to:             Status of service:  Completed, signed off Medicare Important Message given?   (If response is "NO", the following Medicare IM given date fields will be blank) Date Medicare IM given:   Date Additional Medicare IM given:    Discharge Disposition:  HOME/SELF CARE  Per UR Regulation:    Comments:  05/14/11 14:40 Letha Cape RN, BSN [phone] patient her for HD, dc to home today, no needs identified.

## 2011-05-14 NOTE — Discharge Summary (Addendum)
Physician Discharge Summary  Alexandra [name] MRN: [id number] DOB/AGE: 10-22-1945 66 y.o.  PCP: [name], MD, MD   Admit date: 05/13/2011 Discharge date: 05/14/2011  Discharge Diagnoses:  Left AMA   HTN (hypertension)  DM (diabetes mellitus)  Anemia of chronic disease  End stage renal disease on dialysis   Medication List  As of 05/14/2011 11:48 AM   ASK your doctor about these medications         allopurinol 100 MG tablet   Commonly known as: ZYLOPRIM   Take 100 mg by mouth daily.      amLODipine 5 MG tablet   Commonly known as: NORVASC   Take 5 mg by mouth at bedtime.      calcium acetate 667 MG capsule   Commonly known as: PHOSLO   Take 1,334 mg by mouth 3 (three) times daily with meals.      Coenzyme Q10 150 MG Caps   Take 300 mg by mouth daily.      colchicine 0.6 MG tablet   Take 0.6 mg by mouth daily as needed. For gout.      docusate sodium 100 MG capsule   Commonly known as: COLACE   Take 200 mg by mouth at bedtime.      hydrALAZINE 25 MG tablet   Commonly known as: APRESOLINE   Take 50 mg by mouth 3 (three) times daily.      l-methylfolate-B6-B12 3-35-2 MG Tabs   Commonly known as: METANX   Take 1 tablet by mouth 2 (two) times daily.      losartan 50 MG tablet   Commonly known as: COZAAR   Take 50 mg by mouth at bedtime.      metoprolol succinate 50 MG 24 hr tablet   Commonly known as: TOPROL-XL   Take 50 mg by mouth at bedtime.      multivitamin Tabs tablet   Take 1 tablet by mouth daily.      repaglinide 0.5 MG tablet   Commonly known as: PRANDIN   Take 0.5 mg by mouth 3 (three) times daily before meals.            Discharge Condition: not seen  Disposition: 01-Home or Self Care   Consults: not seen  Significant Diagnostic Studies:    Microbiology: No results found for this or any previous visit (from the past 240 hour(s)).   Labs: Results for orders placed during the hospital encounter of 05/13/11 (from the  past 48 hour(s))  GLUCOSE, CAPILLARY     Status: Abnormal   Collection Time   05/14/11 12:33 AM      Component Value Range Comment   Glucose-Capillary 106 (*) 70 - 99 (mg/dL)   GLUCOSE, CAPILLARY     Status: Abnormal   Collection Time   05/14/11  5:51 AM      Component Value Range Comment   Glucose-Capillary 126 (*) 70 - 99 (mg/dL)   CBC     Status: Abnormal   Collection Time   05/14/11  6:01 AM      Component Value Range Comment   WBC 8.2  4.0 - 10.5 (K/uL)    RBC 3.56 (*) 3.87 - 5.11 (MIL/uL)    Hemoglobin 10.4 (*) 12.0 - 15.0 (g/dL)    HCT 40.9 (*) 81.1 - 46.0 (%)    MCV 87.9  78.0 - 100.0 (fL)    MCH 29.2  26.0 - 34.0 (pg)    MCHC 33.2  30.0 - 36.0 (  g/dL)    RDW 45.4 (*) 09.8 - 15.5 (%)    Platelets 201  150 - 400 (K/uL)   DIFFERENTIAL     Status: Normal   Collection Time   05/14/11  6:01 AM      Component Value Range Comment   Neutrophils Relative 53  43 - 77 (%)    Neutro Abs 4.3  1.7 - 7.7 (K/uL)    Lymphocytes Relative 33  12 - 46 (%)    Lymphs Abs 2.7  0.7 - 4.0 (K/uL)    Monocytes Relative 11  3 - 12 (%)    Monocytes Absolute 0.9  0.1 - 1.0 (K/uL)    Eosinophils Relative 3  0 - 5 (%)    Eosinophils Absolute 0.3  0.0 - 0.7 (K/uL)    Basophils Relative 1  0 - 1 (%)    Basophils Absolute 0.0  0.0 - 0.1 (K/uL)   RENAL FUNCTION PANEL     Status: Abnormal   Collection Time   05/14/11  6:03 AM      Component Value Range Comment   Sodium 139  135 - 145 (mEq/L)    Potassium 5.5 (*) 3.5 - 5.1 (mEq/L)    Chloride 99  96 - 112 (mEq/L)    CO2 23  19 - 32 (mEq/L)    Glucose, Bld 99  70 - 99 (mg/dL)    BUN 87 (*) 6 - 23 (mg/dL)    Creatinine, Ser 11.91 (*) 0.50 - 1.10 (mg/dL)    Calcium 47.8  8.4 - 10.5 (mg/dL)    Phosphorus 3.2  2.3 - 4.6 (mg/dL)    Albumin 3.6  3.5 - 5.2 (g/dL)    GFR calc non Af Amer 2 (*) >90 (mL/min)    GFR calc Af Amer 3 (*) >90 (mL/min)      Patient left without me being notified about her release from the dialysis unit  I have not met her seen her  on examined her during this admission,  Nurse Roselee Nova , HD nurse , released her from a HD unit,at around 10 am  Nobody called me to notify    Discharge Exam:   Blood pressure 210/76, pulse 82, temperature 98.2 F (36.8 C), temperature source Oral, resp. rate 20, weight 61.6 kg (135 lb 12.9 oz), SpO2 94.00%.   Not examined due to the above     Follow-up Information    Follow up with [name], MD .         Signed: Richarda Overlie 05/14/2011, 11:48 AM

## 2011-05-15 ENCOUNTER — Emergency Department (HOSPITAL_COMMUNITY): Payer: Medicare HMO

## 2011-05-15 ENCOUNTER — Inpatient Hospital Stay (HOSPITAL_COMMUNITY)
Admission: EM | Admit: 2011-05-15 | Discharge: 2011-05-16 | DRG: 685 | Disposition: A | Discharge status: Home / Self Care | Payer: Medicare HMO | Attending: Emergency Medicine | Admitting: Emergency Medicine

## 2011-05-15 ENCOUNTER — Encounter (HOSPITAL_COMMUNITY): Payer: Self-pay | Admitting: *Deleted

## 2011-05-15 DIAGNOSIS — N2581 Secondary hyperparathyroidism of renal origin: Secondary | ICD-10-CM | POA: Diagnosis present

## 2011-05-15 DIAGNOSIS — F17200 Nicotine dependence, unspecified, uncomplicated: Secondary | ICD-10-CM | POA: Diagnosis present

## 2011-05-15 DIAGNOSIS — E785 Hyperlipidemia, unspecified: Secondary | ICD-10-CM | POA: Diagnosis present

## 2011-05-15 DIAGNOSIS — I120 Hypertensive chronic kidney disease with stage 5 chronic kidney disease or end stage renal disease: Secondary | ICD-10-CM | POA: Diagnosis present

## 2011-05-15 DIAGNOSIS — D638 Anemia in other chronic diseases classified elsewhere: Secondary | ICD-10-CM | POA: Diagnosis present

## 2011-05-15 DIAGNOSIS — Z992 Dependence on renal dialysis: Principal | ICD-10-CM

## 2011-05-15 DIAGNOSIS — M109 Gout, unspecified: Secondary | ICD-10-CM | POA: Diagnosis present

## 2011-05-15 DIAGNOSIS — E119 Type 2 diabetes mellitus without complications: Secondary | ICD-10-CM | POA: Diagnosis present

## 2011-05-15 DIAGNOSIS — N186 End stage renal disease: Secondary | ICD-10-CM | POA: Diagnosis present

## 2011-05-15 LAB — CBC
HCT: 29.6 % — ABNORMAL LOW (ref 36.0–46.0)
Hemoglobin: 9.6 g/dL — ABNORMAL LOW (ref 12.0–15.0)
MCH: 28.6 pg (ref 26.0–34.0)
MCHC: 32.4 g/dL (ref 30.0–36.0)
MCV: 88.1 fL (ref 78.0–100.0)
Platelets: 144 K/uL — ABNORMAL LOW (ref 150–400)
RBC: 3.36 MIL/uL — ABNORMAL LOW (ref 3.87–5.11)
RDW: 18.6 % — ABNORMAL HIGH (ref 11.5–15.5)
WBC: 6.6 K/uL (ref 4.0–10.5)

## 2011-05-15 LAB — CREATININE, SERUM
Creatinine, Ser: 7.35 mg/dL — ABNORMAL HIGH (ref 0.50–1.10)
GFR calc Af Amer: 6 mL/min — ABNORMAL LOW (ref >90)
GFR calc non Af Amer: 5 mL/min — ABNORMAL LOW (ref >90)

## 2011-05-15 MED ORDER — METOPROLOL SUCCINATE ER 50 MG PO TB24: 50.0000 mg | ORAL_TABLET | Freq: Every day | ORAL | Status: DC

## 2011-05-15 MED ORDER — SODIUM CHLORIDE 0.9 % IJ SOLN: 3.0000 mL | Freq: Two times a day (BID) | INTRAMUSCULAR | Status: DC

## 2011-05-15 MED ORDER — L-METHYLFOLATE-B6-B12 3-35-2 MG PO TABS: 1.0000 | ORAL_TABLET | Freq: Two times a day (BID) | ORAL | Status: DC

## 2011-05-15 MED ORDER — DARBEPOETIN ALFA-POLYSORBATE 100 MCG/0.5ML IJ SOLN
100.0000 ug | Freq: Once | INTRAMUSCULAR | Status: AC
  Administered 2011-05-16: 100 ug via INTRAVENOUS

## 2011-05-15 MED ORDER — REPAGLINIDE 0.5 MG PO TABS: 0.5000 mg | ORAL_TABLET | Freq: Three times a day (TID) | ORAL | Status: DC

## 2011-05-15 MED ORDER — ACETAMINOPHEN 650 MG RE SUPP: 650.0000 mg | Freq: Four times a day (QID) | RECTAL | Status: DC | PRN

## 2011-05-15 MED ORDER — SODIUM CHLORIDE 0.9 % IJ SOLN: 3.0000 mL | INTRAMUSCULAR | Status: DC | PRN

## 2011-05-15 MED ORDER — SODIUM CHLORIDE 0.9 % IV SOLN: 250.0000 mL | INTRAVENOUS | Status: DC | PRN

## 2011-05-15 MED ORDER — HYDRALAZINE HCL 50 MG PO TABS: 50.0000 mg | ORAL_TABLET | Freq: Three times a day (TID) | ORAL | Status: DC

## 2011-05-15 MED ORDER — LOSARTAN POTASSIUM 50 MG PO TABS: 50.0000 mg | ORAL_TABLET | Freq: Every day | ORAL | Status: DC

## 2011-05-15 MED ORDER — ONDANSETRON HCL 4 MG/2ML IJ SOLN: 4.0000 mg | Freq: Four times a day (QID) | INTRAMUSCULAR | Status: DC | PRN

## 2011-05-15 MED ORDER — RENA-VITE PO TABS: 1.0000 | ORAL_TABLET | Freq: Every day | ORAL | Status: DC

## 2011-05-15 MED ORDER — PARICALCITOL 5 MCG/ML IV SOLN
1.0000 ug | Freq: Once | INTRAVENOUS | Status: AC
  Administered 2011-05-16: 1 ug via INTRAVENOUS

## 2011-05-15 MED ORDER — INSULIN ASPART 100 UNIT/ML ~~LOC~~ SOLN: 0.0000 [IU] | Freq: Three times a day (TID) | SUBCUTANEOUS | Status: DC

## 2011-05-15 MED ORDER — CALCIUM ACETATE 667 MG PO CAPS: 1334.0000 mg | ORAL_CAPSULE | Freq: Three times a day (TID) | ORAL | Status: DC

## 2011-05-15 MED ORDER — HEPARIN SODIUM (PORCINE) 5000 UNIT/ML IJ SOLN
5000.0000 [IU] | Freq: Three times a day (TID) | INTRAMUSCULAR | Status: DC
  Filled 2011-05-15: qty 1

## 2011-05-15 MED ORDER — ALLOPURINOL 100 MG PO TABS: 100.0000 mg | ORAL_TABLET | Freq: Every day | ORAL | Status: DC

## 2011-05-15 MED ORDER — ACETAMINOPHEN 325 MG PO TABS: 650.0000 mg | ORAL_TABLET | Freq: Four times a day (QID) | ORAL | Status: DC | PRN

## 2011-05-15 MED ORDER — DOCUSATE SODIUM 100 MG PO CAPS: 200.0000 mg | ORAL_CAPSULE | Freq: Every day | ORAL | Status: DC

## 2011-05-15 MED ORDER — HEPARIN SODIUM (PORCINE) 1000 UNIT/ML DIALYSIS
20.0000 [IU]/kg | INTRAMUSCULAR | Status: DC | PRN
  Administered 2011-05-15: 1200 [IU] via INTRAVENOUS_CENTRAL

## 2011-05-15 MED ORDER — AMLODIPINE BESYLATE 5 MG PO TABS: 5.0000 mg | ORAL_TABLET | Freq: Every day | ORAL | Status: DC

## 2011-05-15 MED ORDER — COLCHICINE 0.6 MG PO TABS: 0.6000 mg | ORAL_TABLET | Freq: Every day | ORAL | Status: DC | PRN

## 2011-05-15 MED ORDER — ONDANSETRON HCL 4 MG PO TABS: 4.0000 mg | ORAL_TABLET | Freq: Four times a day (QID) | ORAL | Status: DC | PRN

## 2011-05-15 NOTE — Consult Note (Signed)
  Pt to be admitted by triad . i will write a nephrology dialysis consult. See orders.

## 2011-05-15 NOTE — ED Notes (Signed)
Pt here for dialysis. No distress noted at triage.

## 2011-05-15 NOTE — ED Notes (Signed)
Meal tray ordered 

## 2011-05-15 NOTE — H&P (Signed)
Alexandra [name] MRN: [id number] DOB/AGE: Feb 26, 1946 66 y.o. Primary Care Physician:ROSS,CHARLES [name], MD, MD Admit date: 05/15/2011 Chief Complaint: Needs dialysis HPI:  Ms. Alexandra [name] is a pleasant 66 year old African American female with history of hypertension, diabetes, tobacco abuse, end-stage renal disease on hemodialysis Tuesday Thursdays and Saturdays who presents to the ED for dialysis. Patient denies any shortness of breath denies any lower extremity edema. Patient's last dialysis was on Thursday. Patient denies any fever, no chills, no cough, no chest pain, no shortness of breath, no abdominal pain, no nausea, no vomiting, no diarrhea, no constipation, no dysuria. No weakness. Dr. Leretha Dykes has been notified of patient's admission and dialysis orders have been written per Dr. Leretha Dykes. We are admitting the patient to have her dialyzed.  Past Medical History  Diagnosis Date  . Chronic kidney disease     esrd  . Diabetes mellitus   . Hyperlipidemia   . Hypertension   . Renal disorder   . Dialysis care     tues, thurs, sat  . 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 Carotid endarterectomy 2002    left  . Btl   . Rectal fissurectomy   . Av fistula placement 10/30/2010    right brachiocephalic   . Fistulogram     Prior to Admission medications   Medication Sig Start Date End Date Taking? Authorizing Provider  allopurinol (ZYLOPRIM) 100 MG tablet Take 100 mg by mouth daily.     Yes Historical Provider, MD  amLODipine (NORVASC) 5 MG tablet Take 5 mg by mouth at bedtime.    Yes Historical Provider, MD  calcium acetate (PHOSLO) 667 MG capsule Take 1,334 mg by mouth 3 (three) times daily with meals.    Yes Historical Provider, MD  Coenzyme Q10 150 MG CAPS Take 300 mg by mouth daily.    Yes Historical Provider, MD  colchicine 0.6 MG tablet Take 0.6 mg by mouth daily as needed. For gout.   Yes Historical Provider, MD  docusate sodium  (COLACE) 100 MG capsule Take 200 mg by mouth at bedtime.     Yes Historical Provider, MD  hydrALAZINE (APRESOLINE) 25 MG tablet Take 50 mg by mouth 3 (three) times daily.    Yes Historical Provider, MD  l-methylfolate-B6-B12 (METANX) 3-35-2 MG TABS Take 1 tablet by mouth 2 (two) times daily.    Yes Historical Provider, MD  losartan (COZAAR) 50 MG tablet Take 50 mg by mouth at bedtime.    Yes Historical Provider, MD  metoprolol (TOPROL-XL) 50 MG 24 hr tablet Take 50 mg by mouth at bedtime.    Yes Historical Provider, MD  multivitamin (RENA-VIT) TABS tablet Take 1 tablet by mouth daily.     Yes Historical Provider, MD  repaglinide (PRANDIN) 0.5 MG tablet Take 0.5 mg by mouth 3 (three) times daily before meals.     Yes Historical Provider, MD    Allergies:  Allergies  Allergen Reactions  . Codeine Other (See Comments)    Passes out  . Epinephrine Other (See Comments)    Tachycardia, diaphoresis, syncope  . Percocet (Oxycodone-Acetaminophen) Other (See Comments)    Passes  out    Family History  Problem Relation Age of Onset  . COPD Mother   . Kidney disease Mother   . Heart disease Father   . Lymphoma Son     Social History:  reports that she has been smoking Cigarettes.  She has a 50  pack-year smoking history. She has never used smokeless tobacco. She reports that she does not drink alcohol or use illicit drugs.  ROS: All systems reviewed with the patient and was positive as per HPI otherwise all other systems are negative.  PHYSICAL EXAM: Blood pressure 198/57, pulse 86, temperature 98.3 F (36.8 C), temperature source Oral, resp. rate 20, SpO2 99.00%. General: Alert, awake, oriented x3, well-developed well-nourished in no acute cardiopulmonary distress. HEENT: Normocephalic atraumatic. Pupils equal round and reactive to light and recommendations. Extraocular movements intact. Oropharynx is clear no lesions no exudates. Neck is supple with no lymphadenopathy. No bruits, no  goiter. Heart: Regular rate and rhythm, without murmurs, rubs, gallops. Lungs: Bibasilar crackles. Abdomen: Soft, nontender, nondistended, positive bowel sounds. Extremities: No clubbing cyanosis. 1-2+ pedal edema with positive pedal pulses. Neuro: Alert and on to x3. CN 2 through 12 are grossly intact. No focal deficits.     EKG: None  No results found for this or any previous visit (from the past 240 hour(s)).   Lab results:  [facility] 05/14/11 0603  NA 139  K 5.5*  CL 99  CO2 23  GLUCOSE 99  BUN 87*  CREATININE 13.53*  CALCIUM 10.4  MG --  PHOS 3.2    Basename 05/14/11 0603  AST --  ALT --  ALKPHOS --  BILITOT --  PROT --  ALBUMIN 3.6   No results found for this basename: LIPASE:2,AMYLASE:2 in the last 72 hours  Basename 05/14/11 0601  WBC 8.2  NEUTROABS 4.3  HGB 10.4*  HCT 31.3*  MCV 87.9  PLT 201   No results found for this basename: CKTOTAL:3,CKMB:3,CKMBINDEX:3,TROPONINI:3 in the last 72 hours No components found with this basename: POCBNP:3 No results found for this basename: DDIMER in the last 72 hours No results found for this basename: HGBA1C:2 in the last 72 hours No results found for this basename: CHOL:2,HDL:2,LDLCALC:2,TRIG:2,CHOLHDL:2,LDLDIRECT:2 in the last 72 hours No results found for this basename: TSH,T4TOTAL,FREET3,T3FREE,THYROIDAB in the last 72 hours No results found for this basename: VITAMINB12:2,FOLATE:2,FERRITIN:2,TIBC:2,IRON:2,RETICCTPCT:2 in the last 72 hours Imaging results:  No results found. Impression/Plan:  Principal Problem:  *End stage renal disease on dialysis Active Problems:  HTN (hypertension)  DM (diabetes mellitus)  Hyperparathyroidism, secondary renal  Hyperlipidemia  Anemia of chronic disease  Gout   #1 end-stage renal disease on HD Dr. Leretha Dykes has been notified hemodialysis orders have been written. Patient to be dialyzed. Per Dr. Leretha Dykes. #2 hypertension Stable. Continue home regimen. #3 diabetes  mellitus Stable. Continue home dose Prandin. Sliding scale insulin. #4 hyperlipidemia Stable. #5 anemia of chronic disease H&H is stable. Per renal.   Charly Holcomb 05/15/2011, 8:03 PM

## 2011-05-16 LAB — RENAL FUNCTION PANEL
Albumin: 3.2 g/dL — ABNORMAL LOW (ref 3.5–5.2)
Chloride: 101 mEq/L (ref 96–112)
Creatinine, Ser: 7.39 mg/dL — ABNORMAL HIGH (ref 0.50–1.10)
GFR calc Af Amer: 6 mL/min — ABNORMAL LOW (ref >90)
GFR calc non Af Amer: 5 mL/min — ABNORMAL LOW (ref >90)
Potassium: 4.1 mEq/L (ref 3.5–5.1)
Sodium: 142 mEq/L (ref 135–145)

## 2011-05-16 LAB — DIFFERENTIAL
Basophils Absolute: 0 10*3/uL (ref 0.0–0.1)
Basophils Relative: 0 % (ref 0–1)
Eosinophils Relative: 2 % (ref 0–5)
Monocytes Absolute: 0.9 10*3/uL (ref 0.1–1.0)
Neutro Abs: 3.1 10*3/uL (ref 1.7–7.7)

## 2011-05-16 LAB — HEMOGLOBIN A1C
Hgb A1c MFr Bld: 5.4 % (ref <5.7)
Mean Plasma Glucose: 108 mg/dL (ref <117)

## 2011-05-16 MED ORDER — DARBEPOETIN ALFA-POLYSORBATE 100 MCG/0.5ML IJ SOLN
INTRAMUSCULAR | Status: AC
  Administered 2011-05-16: 100 ug via INTRAVENOUS
  Filled 2011-05-16: qty 0.5

## 2011-05-16 MED ORDER — PARICALCITOL 5 MCG/ML IV SOLN
INTRAVENOUS | Status: AC
  Administered 2011-05-16: 1 ug via INTRAVENOUS
  Filled 2011-05-16: qty 1

## 2011-05-16 NOTE — Progress Notes (Signed)
4098:  Nursing Note:  Took over HD session within the first hour of her Hd session 05/15/11.   Pt was very calm, cooperative and receptive to explanations and education given from myself as the HD RN.  Pt never said a be-littling word, crude comment, bossy, arrogant, or was verbally abusive toward me or any of the other HD staff while under my care today.    It is, however, documented that the patient has spoken socially unacceptably and been verbally abusive to the staff in the past at her previous [facility] center as well as the staff here at [facility].  This habitual behavior has ultimately lead to her being terminated from [facility].  Pt must come to [facility] and/or another acute setting in order to receive HD txs TIW.  Pt often makes frequent phone calls to the unit asking about census and how long she will have to wait to come upstairs for her treatment.  She has made calls to Dr. Jeri Cos to see if he could influence expediting her tx as well.  Multiple personnel have explained not only to the patient but also reminded Dr. Bascom Levels that we are an acute setting and we must triage the patients based on acuity, staff to patient ratios and other policies.  If [name]-Offutt presents to the ED and her labs and CXR are WNL then she is not considered an "emergent" patient.  It has been reported that she has refused to even have these tests done in the past.    Late Entry:  On 05/11/11 pt had waited in the ED for very many hours and came into the HD unit very angry and agitated. As I attempted to aide her primary HD RN, Clarisa Fling get her HD session started by helping with the pre-documentation I noted that the patient was being very demeaning to both [name] and I.  Pt wanted [name] not to change her catheter bandage prior to hooking her up so that her dialysis time would hurry up and begin and she would not be in the HD unit "all night."  [name] explained to her that it was policy to change and assess the insertion and cath  site prior to HD.  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   During any attempt for either RN to begin an education process about how our unit is ran and governed by not only [facility] but [facility] as well as our governing body of [facility] MDs.  She is only familiar to the outpatient setting and does not agree with the fact that she must go through the ED every time she needs a treatment and not be able to come directly up for HD upon her arrival because we do not have a set schedule or the ability to make appointments.  In the past she has often signed AMA forms in the ED to be D/C'd prior to her HD session if she is told she will have to wait multiple hours prior to her tx. She has threatened to (if she has not already) call medicare, [employer], our Associate Professor as well as [facility] upper management to discuss how our unit is ran and how she "is  treated" and "made to wait on purpose".  After hearing her belittle both Clarisa Fling, RN and myself over and over again for trying to explain policies,  I notified pt that if her behavior continued then I was going to call security and have them sit at her bedside.  She said, "why"?  I told her that I felt that her constant verbal abuse to the staff was threatening behavior and if security was not enough then there was a Chemical engineer on staff at [employer] that was available as well.  Pt stated that was "insulting" to her.  I suggested the possibility of other modalities of treatment for her due to her sub-par judgements of our staff based on her long list of expertise and knowledge.  She stated that she had tried peritoneal dialysis in the past and it did not work and was "pre-historic" and that she also was not willing to try  home hemodialysis either.  Shortly after the patient was running on the HD machine on this date 05/11/11 and left pt was heard commenting on several occasions by HD staff that I was a "dominatrix".    Pt has also convinced Dr. Bascom Levels to order her Aranesp even when her HGB is within therapeutic range for an ESRD patient.  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  I personally tried to offer her information on how to research on the [facility] website as well as HD drug websites about how the information on prescribing drugs for ESRD patients are different that other patients but the patient gets in a non-receptive state and will never let you get any words out.  She will constantly talk over you during any attempt of education or the quoting of our policies and procedures.  The Aranesp drug information is required to be given to each patient every 30days and was left for the patient to read on her bedside table but she left it.    Nancy Fetter, RN

## 2011-05-16 NOTE — ED Provider Notes (Signed)
History     CSN: [id number]  Arrival date & time 05/15/11  1641   First MD Initiated Contact with Patient 05/15/11 1706      Chief Complaint  Patient presents with  . Vascular Access Problem    (Consider location/radiation/quality/duration/timing/severity/associated sxs/prior treatment) HPI Patient presents to the emergency department for her routine dialysis.  Dr. Bascom Levels saw her prior to me entering the room.  Patient has no complaints at this time. Past Medical History  Diagnosis Date  . Chronic kidney disease     esrd  . Diabetes mellitus   . Hyperlipidemia   . Hypertension   . Renal disorder   . Dialysis care     tues, thurs, sat  . 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 Carotid endarterectomy 2002    left  . Btl   . Rectal fissurectomy   . Av fistula placement 10/30/2010    right brachiocephalic   . Fistulogram     Family History  Problem Relation Age of Onset  . COPD Mother   . Kidney disease Mother   . Heart disease Father   . Lymphoma Son     History  Substance Use Topics  . Smoking status: Current Everyday Smoker -- 1.0 packs/day for 50 years    Types: Cigarettes  . Smokeless tobacco: Never Used  . Alcohol Use: No    OB History    Grav Para Term Preterm Abortions TAB SAB Ect Mult Living                  Review of Systems All pertinent positives and negatives reviewed in the history of present illness  Allergies  Codeine; Epinephrine; and Percocet  Home Medications   Current Outpatient Rx  Name Route Sig Dispense Refill  . ALLOPURINOL 100 MG PO TABS Oral Take 100 mg by mouth daily.      Marland Kitchen AMLODIPINE BESYLATE 5 MG PO TABS Oral Take 5 mg by mouth at bedtime.     Marland Kitchen CALCIUM ACETATE 667 MG PO CAPS Oral Take 1,334 mg by mouth 3 (three) times daily with meals.     Marland Kitchen COENZYME Q10 150 MG PO CAPS Oral Take 300 mg by mouth daily.     . COLCHICINE 0.6 MG PO TABS Oral Take 0.6 mg by mouth daily as needed. For  gout.    Marland Kitchen DOCUSATE SODIUM 100 MG PO CAPS Oral Take 200 mg by mouth at bedtime.      Marland Kitchen HYDRALAZINE HCL 25 MG PO TABS Oral Take 50 mg by mouth 3 (three) times daily.     . L-METHYLFOLATE-B6-B12 3-35-2 MG PO TABS Oral Take 1 tablet by mouth 2 (two) times daily.     Marland Kitchen LOSARTAN POTASSIUM 50 MG PO TABS Oral Take 50 mg by mouth at bedtime.     Marland Kitchen METOPROLOL SUCCINATE ER 50 MG PO TB24 Oral Take 50 mg by mouth at bedtime.     Marland Kitchen RENA-VITE PO TABS Oral Take 1 tablet by mouth daily.      Marland Kitchen REPAGLINIDE 0.5 MG PO TABS Oral Take 0.5 mg by mouth 3 (three) times daily before meals.        BP 179/75  Pulse 85  Temp(Src) 97.5 F (36.4 C) (Oral)  Resp 16  Wt 136 lb 14.5 oz (62.1 kg)  SpO2 100%  Physical Exam  Constitutional: She appears well-developed and well-nourished. No distress.  HENT:  Head: Normocephalic and  atraumatic.  Eyes: Pupils are equal, round, and reactive to light.  Cardiovascular: Normal rate, regular rhythm and normal heart sounds.  Exam reveals no gallop and no friction rub.   No murmur heard. Pulmonary/Chest: Effort normal and breath sounds normal. No respiratory distress. She has no wheezes. She has no rales.    ED Course  Procedures (including critical care time)  Labs Reviewed  CBC - Abnormal; Notable for the following:    RBC 3.36 (*)    Hemoglobin 9.6 (*)    HCT 29.6 (*)    RDW 18.6 (*)    Platelets 144 (*)    All other components within normal limits  CREATININE, SERUM - Abnormal; Notable for the following:    Creatinine, Ser 7.35 (*) DELTA CHECK NOTED   GFR calc non Af Amer 5 (*)    GFR calc Af Amer 6 (*)    All other components within normal limits  CBC  RENAL FUNCTION PANEL  HEMOGLOBIN A1C  CBC  DIFFERENTIAL  RENAL FUNCTION PANEL   No results found.   1. End stage renal disease on dialysis    Patient admitted for dialysis.  I spoke triad hospitalist about the patient and he will handle the care.  MDM          Carlyle Dolly,  PA-C 05/16/11 1610

## 2011-05-18 ENCOUNTER — Observation Stay (HOSPITAL_COMMUNITY)
Admission: EM | Admit: 2011-05-18 | Discharge: 2011-05-18 | Disposition: A | Discharge status: Home / Self Care | Payer: Medicare HMO | Attending: Internal Medicine | Admitting: Internal Medicine

## 2011-05-18 ENCOUNTER — Emergency Department (HOSPITAL_COMMUNITY): Admit: 2011-05-18 | Discharge: 2011-05-18 | Disposition: A | Discharge status: Home / Self Care | Payer: Medicare HMO

## 2011-05-18 ENCOUNTER — Encounter (HOSPITAL_COMMUNITY): Payer: Self-pay | Admitting: Emergency Medicine

## 2011-05-18 DIAGNOSIS — I120 Hypertensive chronic kidney disease with stage 5 chronic kidney disease or end stage renal disease: Secondary | ICD-10-CM | POA: Insufficient documentation

## 2011-05-18 DIAGNOSIS — M109 Gout, unspecified: Secondary | ICD-10-CM | POA: Insufficient documentation

## 2011-05-18 DIAGNOSIS — F17200 Nicotine dependence, unspecified, uncomplicated: Secondary | ICD-10-CM | POA: Insufficient documentation

## 2011-05-18 DIAGNOSIS — E785 Hyperlipidemia, unspecified: Secondary | ICD-10-CM | POA: Insufficient documentation

## 2011-05-18 DIAGNOSIS — Z992 Dependence on renal dialysis: Secondary | ICD-10-CM | POA: Insufficient documentation

## 2011-05-18 DIAGNOSIS — N186 End stage renal disease: Secondary | ICD-10-CM | POA: Insufficient documentation

## 2011-05-18 DIAGNOSIS — N19 Unspecified kidney failure: Secondary | ICD-10-CM

## 2011-05-18 DIAGNOSIS — E119 Type 2 diabetes mellitus without complications: Secondary | ICD-10-CM | POA: Insufficient documentation

## 2011-05-18 LAB — CREATININE, URINE, 24 HOUR
Creatinine, Urine: 75.57 mg/dL
Urine Total Volume-UCRE24: 150 mL

## 2011-05-18 MED ORDER — HEPARIN SODIUM (PORCINE) 1000 UNIT/ML DIALYSIS: 20.0000 [IU]/kg | INTRAMUSCULAR | Status: DC | PRN

## 2011-05-18 NOTE — H&P (Signed)
PCP:  [name], MD, MD   DOA:  05/18/2011  2:06 PM  Chief Complaint:  Hemodialysis  HPI: Patient is a pleasant 66 year old  female with history of hypertension, diabetes, tobacco abuse, end-stage renal disease on hemodialysis Tuesday Thursdays and Saturdays who presents to the ED for dialysis. Patient has frequent visits to the emergency room department for hemodialysis.. Patient denies any shortness of breath,  Fever, chills, there are no reports of cough, chest pain, abdominal pain. No nausea, no vomiting, no diarrhea, no constipation, no dysuria. Dr. Leretha Dykes has been notified of patient's admission and dialysis orders have been written per Dr. Leretha Dykes.    Assessment and plan  Principal problem  *End-stage renal disease on HD  - Dr. Leretha Dykes has been notified hemodialysis orders have been written. Patient to be dialyzed. Per Dr. Leretha Dykes today.  Active Problems:  Hypertension  - Stable. Continue home regimen.   Diabetes mellitus - Stable.  - Continue home dose Prandin.  - Sliding scale insulin.   Dyslipidemia - Stable.   Anemia of chronic kidney disease  H&H is stable. Per renal.  Education - Patient is aware of plan of care and treatment - Contact information provided for future concerns or questions  Disposition - Observation, telemetry   Allergies: Allergies  Allergen Reactions  . Codeine Other (See Comments)    Passes out  . Epinephrine Other (See Comments)    Tachycardia, diaphoresis, syncope  . Percocet (Oxycodone-Acetaminophen) Other (See Comments)    Passes  out    Prior to Admission medications   Medication Sig Start Date End Date Taking? Authorizing Provider  allopurinol (ZYLOPRIM) 100 MG tablet Take 100 mg by mouth daily.     Yes Historical Provider, MD  amLODipine (NORVASC) 5 MG tablet Take 5 mg by mouth at bedtime.    Yes Historical Provider, MD  calcium acetate (PHOSLO) 667 MG capsule Take 1,334 mg by mouth 3 (three) times daily with meals.     Yes Historical Provider, MD  Coenzyme Q10 150 MG CAPS Take 300 mg by mouth daily.    Yes Historical Provider, MD  colchicine 0.6 MG tablet Take 0.6 mg by mouth daily as needed. For gout.   Yes Historical Provider, MD  docusate sodium (COLACE) 100 MG capsule Take 200 mg by mouth at bedtime.     Yes Historical Provider, MD  hydrALAZINE (APRESOLINE) 25 MG tablet Take 50 mg by mouth 3 (three) times daily.    Yes Historical Provider, MD  l-methylfolate-B6-B12 (METANX) 3-35-2 MG TABS Take 1 tablet by mouth 2 (two) times daily.    Yes Historical Provider, MD  losartan (COZAAR) 50 MG tablet Take 50 mg by mouth at bedtime.    Yes Historical Provider, MD  metoprolol (TOPROL-XL) 50 MG 24 hr tablet Take 50 mg by mouth at bedtime.    Yes Historical Provider, MD  multivitamin (RENA-VIT) TABS tablet Take 1 tablet by mouth daily.     Yes Historical Provider, MD  repaglinide (PRANDIN) 0.5 MG tablet Take 0.5 mg by mouth 3 (three) times daily before meals.     Yes Historical Provider, MD    Past Medical History  Diagnosis Date  . Chronic kidney disease     esrd  . Diabetes mellitus   . Hyperlipidemia   . Hypertension   . Renal disorder   . Dialysis care     tues, thurs, sat  . 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  Carotid endarterectomy 2002    left  . Btl   . Rectal fissurectomy   . Av fistula placement 10/30/2010    right brachiocephalic   . Fistulogram     Social History:  reports that she has been smoking Cigarettes.  She has a 50 pack-year smoking history. She has never used smokeless tobacco. She reports that she does not drink alcohol or use illicit drugs.  Family History  Problem Relation Age of Onset  . COPD Mother   . Kidney disease Mother   . Heart disease Father   . Lymphoma Son     Review of Systems:  Constitutional: Denies fever, chills, diaphoresis, appetite change and fatigue.  HEENT: Denies photophobia, eye pain, redness, hearing  loss, ear pain, congestion, sore throat, rhinorrhea, sneezing, mouth sores, trouble swallowing, neck pain, neck stiffness and tinnitus.   Respiratory: Denies SOB, DOE, cough, chest tightness,  and wheezing.   Cardiovascular: Denies chest pain, palpitations and leg swelling.  Gastrointestinal: Denies nausea, vomiting, abdominal pain, diarrhea, constipation, blood in stool and abdominal distention.  Genitourinary: Denies dysuria, urgency, frequency, hematuria, flank pain and difficulty urinating.  Musculoskeletal: Denies myalgias, back pain, joint swelling, arthralgias and gait problem.  Skin: Denies pallor, rash and wound.  Neurological: Denies dizziness, seizures, syncope, weakness, light-headedness, numbness and headaches.  Hematological: Denies adenopathy. Easy bruising, personal or family bleeding history  Psychiatric/Behavioral: Denies suicidal ideation, mood changes, confusion, nervousness, sleep disturbance and agitation   Physical Exam:  Filed Vitals:   05/18/11 1402 05/18/11 1615  BP: 177/56 190/75  Pulse: 80 86  Temp: 97.6 F (36.4 C) 97.4 F (36.3 C)  TempSrc:  Oral  Resp: 16 18  SpO2: 95% 100%    Constitutional: Vital signs reviewed.  Patient is a well-developed and well-nourished in no acute distress and cooperative with exam. Alert and oriented x3.  Head: Normocephalic and atraumatic Ear: TM normal bilaterally Mouth: no erythema or exudates, MMM Eyes: PERRL, EOMI, conjunctivae normal, No scleral icterus.  Neck: Supple, Trachea midline normal ROM, No JVD, mass, thyromegaly, or carotid bruit present.  Cardiovascular: RRR, S1 normal, S2 normal, no MRG, pulses symmetric and intact bilaterally Pulmonary/Chest: CTAB, no wheezes, rales, or rhonchi Abdominal: Soft. Non-tender, non-distended, bowel sounds are normal, no masses, organomegaly, or guarding present.  GU: no CVA tenderness Musculoskeletal: No joint deformities, erythema, or stiffness, ROM full and no  nontender Ext: no edema and no cyanosis, pulses palpable bilaterally (DP and PT) Hematology: no cervical, inginal, or axillary adenopathy.  Neurological: A&O x3, Strenght is normal and symmetric bilaterally, cranial nerve II-XII are grossly intact, no focal motor deficit, sensory intact to light touch bilaterally.  Skin: Warm, dry and intact. No rash, cyanosis, or clubbing.  Psychiatric: Normal mood and affect. speech and behavior is normal. Judgment and thought content normal. Cognition and memory are normal.   Labs on Admission:  Results for orders placed during the hospital encounter of 05/18/11 (from the past 48 hour(s))  CREATININE, URINE, 24 HOUR     Status: Abnormal   Collection Time   05/17/11 11:00 AM      Component Value Range Comment   Urine Total Volume-UCRE24 150      Collection Interval-UCRE24 24      Creatinine, Urine 75.57      Creatinine, 24H Ur 113 (*) 700 - 1800 (mg/day)   GLUCOSE, CAPILLARY     Status: Abnormal   Collection Time   05/18/11  2:49 PM      Component Value Range Comment  Glucose-Capillary 154 (*) 70 - 99 (mg/dL)      Time Spent on Admission: Greater than 30 minutes  Jesaiah Fabiano 05/18/2011, 4:42 PM

## 2011-05-18 NOTE — ED Provider Notes (Signed)
History     CSN: [id number]  Arrival date & time 05/18/11  1353   First MD Initiated Contact with Patient 05/18/11 1409      Chief Complaint  Patient presents with  . Vascular Access Problem     HPI Onset- today Course - stable Worsened by - nothing Improved by - nothing She denies shortness of breath  She is here for dialysis  Past Medical History  Diagnosis Date  . Chronic kidney disease     esrd  . Diabetes mellitus   . Hyperlipidemia   . Hypertension   . Renal disorder   . Dialysis care     tues, thurs, sat  . 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 Carotid endarterectomy 2002    left  . Btl   . Rectal fissurectomy   . Av fistula placement 10/30/2010    right brachiocephalic   . Fistulogram     Family History  Problem Relation Age of Onset  . COPD Mother   . Kidney disease Mother   . Heart disease Father   . Lymphoma Son     History  Substance Use Topics  . Smoking status: Current Everyday Smoker -- 1.0 packs/day for 50 years    Types: Cigarettes  . Smokeless tobacco: Never Used  . Alcohol Use: No    OB History    Grav Para Term Preterm Abortions TAB SAB Ect Mult Living                  Review of Systems  Constitutional: Negative for fever.  Respiratory: Negative for shortness of breath.     Allergies  Codeine; Epinephrine; and Percocet  Home Medications   Current Outpatient Rx  Name Route Sig Dispense Refill  . ALLOPURINOL 100 MG PO TABS Oral Take 100 mg by mouth daily.      Marland Kitchen AMLODIPINE BESYLATE 5 MG PO TABS Oral Take 5 mg by mouth at bedtime.     Marland Kitchen CALCIUM ACETATE 667 MG PO CAPS Oral Take 1,334 mg by mouth 3 (three) times daily with meals.     Marland Kitchen COENZYME Q10 150 MG PO CAPS Oral Take 300 mg by mouth daily.     . COLCHICINE 0.6 MG PO TABS Oral Take 0.6 mg by mouth daily as needed. For gout.    Marland Kitchen DOCUSATE SODIUM 100 MG PO CAPS Oral Take 200 mg by mouth at bedtime.      Marland Kitchen HYDRALAZINE HCL 25 MG PO  TABS Oral Take 50 mg by mouth 3 (three) times daily.     . L-METHYLFOLATE-B6-B12 3-35-2 MG PO TABS Oral Take 1 tablet by mouth 2 (two) times daily.     Marland Kitchen LOSARTAN POTASSIUM 50 MG PO TABS Oral Take 50 mg by mouth at bedtime.     Marland Kitchen METOPROLOL SUCCINATE ER 50 MG PO TB24 Oral Take 50 mg by mouth at bedtime.     Marland Kitchen RENA-VITE PO TABS Oral Take 1 tablet by mouth daily.      Marland Kitchen REPAGLINIDE 0.5 MG PO TABS Oral Take 0.5 mg by mouth 3 (three) times daily before meals.        BP 177/56  Pulse 80  Temp 97.6 F (36.4 C)  Resp 16  SpO2 95%  Physical Exam CONSTITUTIONAL: Well developed/well nourished HEAD AND FACE: Normocephalic/atraumatic ENMT: Mucous membranes moist NECK: supple no meningeal signs LUNGS:no apparent distress ABDOMEN: soft, nontender, no rebound or guarding NEURO:  Pt is awake/alert, moves all extremitiesx4 EXTREMITIES:  full ROM SKIN:  color normal PSYCH: no abnormalities of mood noted  ED Course  Procedures     Labs Reviewed  GLUCOSE, CAPILLARY - Abnormal; Notable for the following:    Glucose-Capillary 154 (*)    All other components within normal limits  CREATININE, URINE, 24 HOUR     1. Renal failure       MDM  Nursing notes reviewed and considered in documentation All labs/vitals reviewed and considered   Seen by dr frazier D/w triad to admit        Joya Gaskins, MD 05/18/11 1513

## 2011-05-18 NOTE — ED Notes (Signed)
Dr Frazier at bedside.  

## 2011-05-18 NOTE — Consult Note (Signed)
Pt. In ER I will do a renal consult and dialysis pt.  See orders.

## 2011-05-18 NOTE — ED Notes (Signed)
Paged Dr Audrie Lia to make him aware that pt was in the ED

## 2011-05-18 NOTE — ED Notes (Signed)
Here for dialysis call dr frazier states is to be up stairs at 3 pm

## 2011-05-18 NOTE — ED Notes (Signed)
Diet ordered 

## 2011-05-18 NOTE — ED Notes (Signed)
Upon delivery of a warm blanket, patient informed me that because her sugar was high, she took 0.5mg  of her own Prandin to lower it and that she would call us if she felt her sugar changing drastically.  She stated that her medication would also cover her lunch.

## 2011-05-19 NOTE — ED Provider Notes (Signed)
Evaluation and management procedures were performed by the PA/NP under my supervision/collaboration.    Derec Mozingo D Mallissa Lorenzen, MD 05/19/11 2030 

## 2011-05-20 ENCOUNTER — Encounter (HOSPITAL_COMMUNITY): Payer: Self-pay | Admitting: Emergency Medicine

## 2011-05-20 ENCOUNTER — Observation Stay (HOSPITAL_COMMUNITY)
Admission: EM | Admit: 2011-05-20 | Discharge: 2011-05-21 | Disposition: A | Discharge status: Home / Self Care | Payer: Medicare HMO | Attending: Internal Medicine | Admitting: Internal Medicine

## 2011-05-20 DIAGNOSIS — M109 Gout, unspecified: Secondary | ICD-10-CM | POA: Insufficient documentation

## 2011-05-20 DIAGNOSIS — I120 Hypertensive chronic kidney disease with stage 5 chronic kidney disease or end stage renal disease: Secondary | ICD-10-CM | POA: Insufficient documentation

## 2011-05-20 DIAGNOSIS — D638 Anemia in other chronic diseases classified elsewhere: Secondary | ICD-10-CM | POA: Diagnosis present

## 2011-05-20 DIAGNOSIS — N186 End stage renal disease: Secondary | ICD-10-CM | POA: Insufficient documentation

## 2011-05-20 DIAGNOSIS — Z992 Dependence on renal dialysis: Principal | ICD-10-CM | POA: Insufficient documentation

## 2011-05-20 DIAGNOSIS — F17200 Nicotine dependence, unspecified, uncomplicated: Secondary | ICD-10-CM | POA: Insufficient documentation

## 2011-05-20 DIAGNOSIS — E119 Type 2 diabetes mellitus without complications: Secondary | ICD-10-CM | POA: Insufficient documentation

## 2011-05-20 DIAGNOSIS — D631 Anemia in chronic kidney disease: Secondary | ICD-10-CM | POA: Insufficient documentation

## 2011-05-20 DIAGNOSIS — E785 Hyperlipidemia, unspecified: Secondary | ICD-10-CM | POA: Insufficient documentation

## 2011-05-20 NOTE — ED Notes (Signed)
PT. ADVISED TO GO TO ER FOR HER ROUTINE HEMODIALYSIS  ( TUES/ THURS/SAT) .

## 2011-05-20 NOTE — ED Provider Notes (Addendum)
History     CSN: [id number]  Arrival date & time 05/20/11  2328   First MD Initiated Contact with Patient 05/20/11 2342      No chief complaint on file.   (Consider location/radiation/quality/duration/timing/severity/associated sxs/prior treatment) The history is provided by the patient.  here for dialysis, gets T-R-Sa dialysis here at [facility], was called and told to come in late tonight due to heavy volume.  She has no complaints. No SOB, CP, swelling or fatigue. No recent illness, no N/V/D, no F/C. Moderate in severity. No aggrevating or alleviating factors.    Past Medical History  Diagnosis Date  . Chronic kidney disease     esrd  . Diabetes mellitus   . Hyperlipidemia   . Hypertension   . Renal disorder   . Dialysis care     tues, thurs, sat  . 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 Carotid endarterectomy 2002    left  . Btl   . Rectal fissurectomy   . Av fistula placement 10/30/2010    right brachiocephalic   . Fistulogram     Family History  Problem Relation Age of Onset  . COPD Mother   . Kidney disease Mother   . Heart disease Father   . Lymphoma Son     History  Substance Use Topics  . Smoking status: Current Everyday Smoker -- 1.0 packs/day for 50 years    Types: Cigarettes  . Smokeless tobacco: Never Used  . Alcohol Use: No    OB History    Grav Para Term Preterm Abortions TAB SAB Ect Mult Living                  Review of Systems  Constitutional: Negative for fever and chills.  HENT: Negative for neck pain and neck stiffness.   Eyes: Negative for pain.  Respiratory: Negative for shortness of breath.   Cardiovascular: Negative for chest pain.  Gastrointestinal: Negative for abdominal pain.  Genitourinary: Negative for dysuria.  Musculoskeletal: Negative for back pain.  Skin: Negative for rash.  Neurological: Negative for headaches.  All other systems reviewed and are negative.    Allergies    Codeine; Epinephrine; and Percocet  Home Medications   Current Outpatient Rx  Name Route Sig Dispense Refill  . ALLOPURINOL 100 MG PO TABS Oral Take 100 mg by mouth daily.      Marland Kitchen AMLODIPINE BESYLATE 5 MG PO TABS Oral Take 5 mg by mouth at bedtime.     Marland Kitchen CALCIUM ACETATE 667 MG PO CAPS Oral Take 1,334 mg by mouth 3 (three) times daily with meals.     Marland Kitchen COENZYME Q10 150 MG PO CAPS Oral Take 300 mg by mouth daily.     . COLCHICINE 0.6 MG PO TABS Oral Take 0.6 mg by mouth daily as needed. For gout.    Marland Kitchen DOCUSATE SODIUM 100 MG PO CAPS Oral Take 200 mg by mouth at bedtime.      Marland Kitchen HYDRALAZINE HCL 25 MG PO TABS Oral Take 50 mg by mouth 3 (three) times daily.     . L-METHYLFOLATE-B6-B12 3-35-2 MG PO TABS Oral Take 1 tablet by mouth 2 (two) times daily.     Marland Kitchen LOSARTAN POTASSIUM 50 MG PO TABS Oral Take 50 mg by mouth at bedtime.     Marland Kitchen METOPROLOL SUCCINATE ER 50 MG PO TB24 Oral Take 50 mg by mouth at bedtime.     Marland Kitchen  RENA-VITE PO TABS Oral Take 1 tablet by mouth daily.      Marland Kitchen REPAGLINIDE 0.5 MG PO TABS Oral Take 0.5 mg by mouth 3 (three) times daily before meals.        There were no vitals taken for this visit.  Physical Exam  Constitutional: She is oriented to person, place, and time. She appears well-developed and well-nourished.  HENT:  Head: Normocephalic and atraumatic.  Eyes: Conjunctivae and EOM are normal. Pupils are equal, round, and reactive to light.  Neck: Trachea normal. Neck supple. No thyromegaly present.  Cardiovascular: Normal rate, regular rhythm, S1 normal, S2 normal and normal pulses.     No systolic murmur is present   No diastolic murmur is present  Pulses:      Radial pulses are 2+ on the right side, and 2+ on the left side.  Pulmonary/Chest: Effort normal and breath sounds normal. She has no wheezes. She has no rhonchi. She has no rales. She exhibits no tenderness.  Abdominal: Soft. Normal appearance and bowel sounds are normal. There is no tenderness. There is no CVA  tenderness and negative Murphy's sign.  Musculoskeletal:       BLE:s Calves nontender, no cords or erythema, negative Homans sign  Neurological: She is alert and oriented to person, place, and time. She has normal strength. No cranial nerve deficit or sensory deficit. GCS eye subscore is 4. GCS verbal subscore is 5. GCS motor subscore is 6.  Skin: Skin is warm and dry. No rash noted. She is not diaphoretic.  Psychiatric: Her speech is normal.       Cooperative and appropriate    ED Course  Procedures (including critical care time)  11:46 PM. I spoke with dialysis nurse and they are expecting PT for dialysis at this time. No blood work needed in the ED. No indication for CXR at this time. Lungs clear.    MDM   Plan admit to dialysis floor for scheduled routine dialysis.         Sunnie Nielsen, MD 05/20/11 2348  12:08 AM d/w triad hospitalist who will admit team 6, Dr Toniann Fail.  Sunnie Nielsen, MD 05/21/11 [phone]

## 2011-05-21 ENCOUNTER — Emergency Department (HOSPITAL_COMMUNITY): Payer: Medicare HMO

## 2011-05-21 ENCOUNTER — Encounter (HOSPITAL_COMMUNITY): Payer: Self-pay | Admitting: Internal Medicine

## 2011-05-21 LAB — RENAL FUNCTION PANEL
Albumin: 3.4 g/dL — ABNORMAL LOW (ref 3.5–5.2)
CO2: 25 mEq/L (ref 19–32)
Calcium: 9.5 mg/dL (ref 8.4–10.5)
Chloride: 100 mEq/L (ref 96–112)
Creatinine, Ser: 9.09 mg/dL — ABNORMAL HIGH (ref 0.50–1.10)
GFR calc Af Amer: 5 mL/min — ABNORMAL LOW (ref >90)
GFR calc non Af Amer: 4 mL/min — ABNORMAL LOW (ref >90)
Sodium: 140 mEq/L (ref 135–145)

## 2011-05-21 LAB — GLUCOSE, CAPILLARY: Glucose-Capillary: 107 mg/dL — ABNORMAL HIGH (ref 70–99)

## 2011-05-21 LAB — CBC
HCT: 35.1 % — ABNORMAL LOW (ref 36.0–46.0)
MCHC: 32.2 g/dL (ref 30.0–36.0)
Platelets: 182 10*3/uL (ref 150–400)
RDW: 20.4 % — ABNORMAL HIGH (ref 11.5–15.5)
WBC: 7 10*3/uL (ref 4.0–10.5)

## 2011-05-21 LAB — DIFFERENTIAL
Basophils Absolute: 0 10*3/uL (ref 0.0–0.1)
Basophils Relative: 0 % (ref 0–1)
Eosinophils Relative: 3 % (ref 0–5)
Lymphocytes Relative: 24 % (ref 12–46)
Monocytes Absolute: 0.7 10*3/uL (ref 0.1–1.0)
Neutro Abs: 4.4 10*3/uL (ref 1.7–7.7)

## 2011-05-21 MED ORDER — HYDRALAZINE HCL 20 MG/ML IJ SOLN
10.0000 mg | INTRAMUSCULAR | Status: DC | PRN
  Filled 2011-05-21: qty 0.5

## 2011-05-21 MED ORDER — METOPROLOL SUCCINATE ER 50 MG PO TB24
50.0000 mg | ORAL_TABLET | Freq: Every day | ORAL | Status: DC
  Filled 2011-05-21: qty 1

## 2011-05-21 MED ORDER — ALLOPURINOL 100 MG PO TABS
100.0000 mg | ORAL_TABLET | Freq: Every day | ORAL | Status: DC
  Filled 2011-05-21: qty 1

## 2011-05-21 MED ORDER — REPAGLINIDE 0.5 MG PO TABS
0.5000 mg | ORAL_TABLET | Freq: Three times a day (TID) | ORAL | Status: DC
  Administered 2011-05-21: 0.5 mg via ORAL
  Filled 2011-05-21 (×5): qty 1

## 2011-05-21 MED ORDER — RENA-VITE PO TABS
1.0000 | ORAL_TABLET | Freq: Every day | ORAL | Status: DC
  Filled 2011-05-21: qty 1

## 2011-05-21 MED ORDER — PARICALCITOL 5 MCG/ML IV SOLN
1.0000 ug | Freq: Once | INTRAVENOUS | Status: AC
  Administered 2011-05-21: 1 ug via INTRAVENOUS

## 2011-05-21 MED ORDER — INSULIN ASPART 100 UNIT/ML ~~LOC~~ SOLN
0.0000 [IU] | Freq: Three times a day (TID) | SUBCUTANEOUS | Status: DC
  Administered 2011-05-21: 0 [IU] via SUBCUTANEOUS

## 2011-05-21 MED ORDER — ACETAMINOPHEN 650 MG RE SUPP: 650.0000 mg | Freq: Four times a day (QID) | RECTAL | Status: DC | PRN

## 2011-05-21 MED ORDER — CALCIUM ACETATE 667 MG PO CAPS
1334.0000 mg | ORAL_CAPSULE | Freq: Three times a day (TID) | ORAL | Status: DC
  Administered 2011-05-21: 1334 mg via ORAL
  Filled 2011-05-21 (×6): qty 2

## 2011-05-21 MED ORDER — ACETAMINOPHEN 325 MG PO TABS: 650.0000 mg | ORAL_TABLET | Freq: Four times a day (QID) | ORAL | Status: DC | PRN

## 2011-05-21 MED ORDER — ONDANSETRON HCL 4 MG PO TABS: 4.0000 mg | ORAL_TABLET | Freq: Four times a day (QID) | ORAL | Status: DC | PRN

## 2011-05-21 MED ORDER — COLCHICINE 0.6 MG PO TABS
0.6000 mg | ORAL_TABLET | Freq: Every day | ORAL | Status: DC | PRN
  Filled 2011-05-21: qty 1

## 2011-05-21 MED ORDER — PARICALCITOL 5 MCG/ML IV SOLN
INTRAVENOUS | Status: AC
  Administered 2011-05-21: 1 ug via INTRAVENOUS
  Filled 2011-05-21: qty 1

## 2011-05-21 MED ORDER — SODIUM CHLORIDE 0.9 % IJ SOLN: 3.0000 mL | Freq: Two times a day (BID) | INTRAMUSCULAR | Status: DC

## 2011-05-21 MED ORDER — LOSARTAN POTASSIUM 50 MG PO TABS
50.0000 mg | ORAL_TABLET | Freq: Every day | ORAL | Status: DC
  Filled 2011-05-21: qty 1

## 2011-05-21 MED ORDER — AMLODIPINE BESYLATE 5 MG PO TABS
5.0000 mg | ORAL_TABLET | Freq: Every day | ORAL | Status: DC
  Filled 2011-05-21: qty 1

## 2011-05-21 MED ORDER — DOCUSATE SODIUM 100 MG PO CAPS
200.0000 mg | ORAL_CAPSULE | Freq: Every day | ORAL | Status: DC
  Filled 2011-05-21: qty 2

## 2011-05-21 MED ORDER — ONDANSETRON HCL 4 MG/2ML IJ SOLN: 4.0000 mg | Freq: Four times a day (QID) | INTRAMUSCULAR | Status: DC | PRN

## 2011-05-21 MED ORDER — HYDRALAZINE HCL 50 MG PO TABS
50.0000 mg | ORAL_TABLET | Freq: Three times a day (TID) | ORAL | Status: DC
  Administered 2011-05-21 (×2): 50 mg via ORAL
  Filled 2011-05-21 (×5): qty 1

## 2011-05-21 MED ORDER — L-METHYLFOLATE-B6-B12 3-35-2 MG PO TABS
1.0000 | ORAL_TABLET | Freq: Two times a day (BID) | ORAL | Status: DC
  Filled 2011-05-21 (×3): qty 1

## 2011-05-21 MED ORDER — HEPARIN SODIUM (PORCINE) 1000 UNIT/ML DIALYSIS
20.0000 [IU]/kg | INTRAMUSCULAR | Status: DC | PRN
  Filled 2011-05-21: qty 2

## 2011-05-21 NOTE — Progress Notes (Signed)
Pt left via wheelchair for  Home.  Dr Cherene Altes unit as pt was leaving to come see her.  Informed that pt was leaving.  Pt states she never signs AMA and became angry when it  mentioned .

## 2011-05-21 NOTE — ED Notes (Signed)
Patient in room 32 sleeping in recliner chair   Refused stretcher.  Patient states she just wants to be left alone.

## 2011-05-21 NOTE — ED Notes (Signed)
Patient here for dialysis  Originally scheduled for 0100, however dialysis has an emergent 4hr case Now this patient scheduled for 0600

## 2011-05-21 NOTE — Progress Notes (Signed)
Acute Hemodialysis Manager approached patient to inquire how treatment was progressing.  Patient stated, "This ordeal has cost me $1000.00 today, I'm mad as hell, and just need to be left alone."  Emotional support given.

## 2011-05-21 NOTE — ED Notes (Signed)
Hemodialysis manager met with patient in ED regarding patient's complaint of excessive wait time for dialysis.  Patient expressed frustration that the hemodialysis charge nurse "sounded like a broken record" by repeatedly stating "I can't give you an exact time."  Manager told patient that she was next in line, anticipated within the hour.  Patient stated, "I'll believe it when I see it."

## 2011-05-21 NOTE — H&P (Signed)
Alexandra [name] is an 66 y.o. female.   PCP - Dr.Ross. Nephrologist - Dr.Frazier. Chief Complaint: Has come for dialysis. HPI: 66 year-old female with known history of ESRD on hemodialysis gets time last on Tuesday Thursdays and Saturdays has come to the ER because she missed her dialysis yesterday. Patient denies any sharp or breath chest pain nausea vomiting abdominal pain dizziness or headache. The ER physician Dr. Dierdre Highman has already called the nephrologist and patient began dialyzed. Her labs are still pending.  Past Medical History  Diagnosis Date  . Chronic kidney disease     esrd  . Diabetes mellitus   . Hyperlipidemia   . Hypertension   . Renal disorder   . Dialysis care     tues, thurs, sat  . 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 Carotid endarterectomy 2002    left  . Btl   . Rectal fissurectomy   . Av fistula placement 10/30/2010    right brachiocephalic   . Fistulogram     Family History  Problem Relation Age of Onset  . COPD Mother   . Kidney disease Mother   . Heart disease Father   . Lymphoma Son    Social History:  reports that she has been smoking Cigarettes.  She has a 50 pack-year smoking history. She has never used smokeless tobacco. She reports that she does not drink alcohol or use illicit drugs.  Allergies:  Allergies  Allergen Reactions  . Codeine Other (See Comments)    Passes out  . Epinephrine Other (See Comments)    Tachycardia, diaphoresis, syncope  . Percocet (Oxycodone-Acetaminophen) Other (See Comments)    Passes  out    No current facility-administered medications on file as of 05/20/2011.   Medications Prior to Admission  Medication Sig Dispense Refill  . allopurinol (ZYLOPRIM) 100 MG tablet Take 100 mg by mouth daily.        Marland Kitchen amLODipine (NORVASC) 5 MG tablet Take 5 mg by mouth at bedtime.       . calcium acetate (PHOSLO) 667 MG capsule Take 1,334 mg by mouth 3 (three) times daily with  meals.       . Coenzyme Q10 150 MG CAPS Take 300 mg by mouth daily.       . colchicine 0.6 MG tablet Take 0.6 mg by mouth daily as needed. For gout.      . docusate sodium (COLACE) 100 MG capsule Take 200 mg by mouth at bedtime.        . hydrALAZINE (APRESOLINE) 25 MG tablet Take 50 mg by mouth 3 (three) times daily.       Marland Kitchen l-methylfolate-B6-B12 (METANX) 3-35-2 MG TABS Take 1 tablet by mouth 2 (two) times daily.       Marland Kitchen losartan (COZAAR) 50 MG tablet Take 50 mg by mouth at bedtime.       . metoprolol (TOPROL-XL) 50 MG 24 hr tablet Take 50 mg by mouth at bedtime.       . multivitamin (RENA-VIT) TABS tablet Take 1 tablet by mouth daily.        . repaglinide (PRANDIN) 0.5 MG tablet Take 0.5 mg by mouth 3 (three) times daily before meals.          No results found for this or any previous visit (from the past 48 hour(s)). No results found.  Review of Systems  Constitutional: Negative.   HENT: Negative.   Eyes:  Negative.   Respiratory: Negative.   Cardiovascular: Negative.   Gastrointestinal: Negative.   Genitourinary: Negative.   Musculoskeletal: Negative.   Skin: Negative.   Neurological: Negative.   Endo/Heme/Allergies: Negative.   Psychiatric/Behavioral: Negative.     Blood pressure 191/67, pulse 94, temperature 97.7 F (36.5 C), temperature source Oral, resp. rate 20, SpO2 99.00%. Physical Exam  Constitutional: She is oriented to person, place, and time. She appears well-developed and well-nourished. No distress.  HENT:  Head: Normocephalic and atraumatic.  Right Ear: External ear normal.  Left Ear: External ear normal.  Nose: Nose normal.  Mouth/Throat: Oropharynx is clear and moist. No oropharyngeal exudate.  Eyes: Conjunctivae are normal. Pupils are equal, round, and reactive to light. Right eye exhibits no discharge. Left eye exhibits no discharge. No scleral icterus.  Neck: Normal range of motion. Neck supple.  Cardiovascular: Normal rate and regular rhythm.     Respiratory: Effort normal and breath sounds normal. No respiratory distress. She has no wheezes. She has no rales.  GI: Soft. Bowel sounds are normal.  Musculoskeletal: Normal range of motion. She exhibits no edema and no tenderness.  Neurological: She is alert and oriented to person, place, and time.       Moves all extremities.  Skin: She is not diaphoretic.  Psychiatric: Her behavior is normal.     Assessment/Plan #1. ESRD on hemodialysis - patient is here for dialysis. Patient is not in any acute distress and denies any chest pain or shortness of breath. Patient is about to get dialyzed. Her labs is pending. #2. History of hypertension, diabetes mellitus type 2, anemia secondary ESRD, history of gout -  Continue present medications. Her blood pressure slightly high which I think will get better after dialysis. We'll closely observe. I have placed patient on IV hydralazine when necessary for systolic blood pressure was 160. Patient will also be and skin coverage for her diabetes.  Patient's labs are still pending.  Danyell Awbrey N. 05/21/2011, 12:28 AM

## 2011-05-21 NOTE — ED Notes (Signed)
Patient for dialysis at 0600 this am.

## 2011-05-21 NOTE — ED Notes (Signed)
Called dialysis to see if patient would be going to dialysis at 0600  I was told this was not determined at this time.  As day shift has not arrived and appointments are not given  Times are determined on a case my case basis and need.   Acknowledged understanding   And left phone number [phone] and ask for  Call when ready for patient.

## 2011-05-21 NOTE — Discharge Summary (Signed)
AMA NOTE/DC SUMMARY Discharge Note  Name: Alexandra [name] MRN: [id number] DOB: 04/10/1945 66 y.o.  Date of Admission: 05/20/2011 11:36 PM Date of Discharge: 05/21/2011 Attending Physician: Sunnie Nielsen, MD  Discharge Diagnosis: Principal Problem:  *End stage renal disease on dialysis Active Problems:  HTN (hypertension)  DM (diabetes mellitus)  Hyperlipidemia  Anemia of chronic disease  Gout   Discharge Medications: Medication List  As of 05/21/2011  6:40 PM   ASK your doctor about these medications         allopurinol 100 MG tablet   Commonly known as: ZYLOPRIM   Take 100 mg by mouth daily.      amLODipine 5 MG tablet   Commonly known as: NORVASC   Take 5 mg by mouth at bedtime.      calcium acetate 667 MG capsule   Commonly known as: PHOSLO   Take 1,334 mg by mouth 3 (three) times daily with meals.      Coenzyme Q10 150 MG Caps   Take 300 mg by mouth daily.      colchicine 0.6 MG tablet   Take 0.6 mg by mouth daily as needed. For gout.      docusate sodium 100 MG capsule   Commonly known as: COLACE   Take 200 mg by mouth at bedtime.      hydrALAZINE 25 MG tablet   Commonly known as: APRESOLINE   Take 50 mg by mouth 3 (three) times daily.      l-methylfolate-B6-B12 3-35-2 MG Tabs   Commonly known as: METANX   Take 1 tablet by mouth 2 (two) times daily.      losartan 50 MG tablet   Commonly known as: COZAAR   Take 50 mg by mouth at bedtime.      metoprolol succinate 50 MG 24 hr tablet   Commonly known as: TOPROL-XL   Take 50 mg by mouth at bedtime.      multivitamin Tabs tablet   Take 1 tablet by mouth daily.      repaglinide 0.5 MG tablet   Commonly known as: PRANDIN   Take 0.5 mg by mouth 3 (three) times daily before meals.            Disposition and follow-up:   [name] - LEFT AMA after dialysis.    Follow-up Appointments:   Consultations:    Procedures Performed:  Dialysis  Admission HPI Pt is a 66 year-old  female with known history of ESRD on hemodialysis gets time last on Tuesday Thursdays and Saturdays has come to the ER because she missed her dialysis yesterday. Patient denies any sharp or breath chest pain nausea vomiting abdominal pain dizziness or headache. The ER physician Dr. Dierdre Highman has already called the nephrologist and patient began dialyzed.   Hospital Course by problem list: Principal Problem:  *End stage renal disease on dialysis As discussed following admissiom she went for dialysis and after her dialysis was done she left against medical advise. She refused to sign the AMA form. Active Problems:  HTN (hypertension)  DM (diabetes mellitus)  Hyperlipidemia  Anemia of chronic disease  Gout Pt was to continue her outpt meds and f/u with her PCP outpt.  Discharge Vitals:  BP 167/88  Pulse 80  Temp(Src) 97.3 F (36.3 C) (Oral)  Resp 20  Wt 60.05 kg (132 lb 6.2 oz)  SpO2 95%  Discharge Labs:  Results for orders placed during the hospital encounter of 05/20/11 (from the past 24 hour(s))  GLUCOSE, CAPILLARY     Status: Normal   Collection Time   05/21/11 12:44 AM      Component Value Range   Glucose-Capillary 86  70 - 99 (mg/dL)  GLUCOSE, CAPILLARY     Status: Abnormal   Collection Time   05/21/11  7:35 AM      Component Value Range   Glucose-Capillary 107 (*) 70 - 99 (mg/dL)   Comment 1 Documented in Chart     Comment 2 Notify RN    CBC     Status: Abnormal   Collection Time   05/21/11  1:57 PM      Component Value Range   WBC 7.0  4.0 - 10.5 (K/uL)   RBC 3.89  3.87 - 5.11 (MIL/uL)   Hemoglobin 11.3 (*) 12.0 - 15.0 (g/dL)   HCT 16.1 (*) 09.6 - 46.0 (%)   MCV 90.2  78.0 - 100.0 (fL)   MCH 29.0  26.0 - 34.0 (pg)   MCHC 32.2  30.0 - 36.0 (g/dL)   RDW 04.5 (*) 40.9 - 15.5 (%)   Platelets 182  150 - 400 (K/uL)  DIFFERENTIAL     Status: Normal   Collection Time   05/21/11  1:57 PM      Component Value Range   Neutrophils Relative 63  43 - 77 (%)   Neutro Abs 4.4  1.7 -  7.7 (K/uL)   Lymphocytes Relative 24  12 - 46 (%)   Lymphs Abs 1.7  0.7 - 4.0 (K/uL)   Monocytes Relative 10  3 - 12 (%)   Monocytes Absolute 0.7  0.1 - 1.0 (K/uL)   Eosinophils Relative 3  0 - 5 (%)   Eosinophils Absolute 0.2  0.0 - 0.7 (K/uL)   Basophils Relative 0  0 - 1 (%)   Basophils Absolute 0.0  0.0 - 0.1 (K/uL)  RENAL FUNCTION PANEL     Status: Abnormal   Collection Time   05/21/11  1:57 PM      Component Value Range   Sodium 140  135 - 145 (mEq/L)   Potassium 4.7  3.5 - 5.1 (mEq/L)   Chloride 100  96 - 112 (mEq/L)   CO2 25  19 - 32 (mEq/L)   Glucose, Bld 87  70 - 99 (mg/dL)   BUN 51 (*) 6 - 23 (mg/dL)   Creatinine, Ser 8.11 (*) 0.50 - 1.10 (mg/dL)   Calcium 9.5  8.4 - 91.4 (mg/dL)   Phosphorus 2.9  2.3 - 4.6 (mg/dL)   Albumin 3.4 (*) 3.5 - 5.2 (g/dL)   GFR calc non Af Amer 4 (*) >90 (mL/min)   GFR calc Af Amer 5 (*) >90 (mL/min)    SignedKela Millin 05/21/2011, 6:40 PM

## 2011-05-21 NOTE — ED Notes (Signed)
Dialysis phone back to inform me that the patient had called her direct .  Also Henderson Cloud in dialysis informed us that patient has no dialysis orders from Dr Audrie Lia.   And that a consult need to be made.  I spoke with Dr. Dierdre Highman  To ask for a consult with Dr Bascom Levels .  Dr Dierdre Highman spoke with Dr. Delaney Meigs.  Waiting for answer back.    Patient is demanding and very impatient.   Trying to imtimidate this RN.  Communicating  harassing manner.

## 2011-05-21 NOTE — ED Notes (Signed)
PT. SEEN AT TRIAGE BY DR. Dierdre Highman , PT. WILL BE DIALYZED AT 6 AM THIS MORNING, DENIES ANY PAIN OR DISCOMFORT / RESPIRATIONS UNLABORED.

## 2011-05-21 NOTE — ED Notes (Signed)
Patient refused to go to room 6736  States she never goes to a room.

## 2011-05-21 NOTE — ED Notes (Signed)
Patient continues to be rude to this nurse.  This nurse is trying hard to maintain composure and patient is very difficulty to speak with,.

## 2011-05-21 NOTE — ED Notes (Signed)
CBG 86 mg/dl tested by glucometer in the ED

## 2011-05-21 NOTE — Procedures (Signed)
Patient arrived at hemodialysis unit via wheelchair.  Bed procured from 6736, patient's assigned room, due to unavailability of recliners already in use.  Patient expressed preference for a recliner, but finally agreed to take treatment in bed with HOB completely elevated.

## 2011-05-21 NOTE — ED Notes (Signed)
Renal/med carb mod b'fast requested

## 2011-05-21 NOTE — ED Notes (Addendum)
Patient is up and very demanding  Made me call dialysis twice insisting on trying to find out when her "appointment" is for dialysis.     Made calls for patient as she stood over me.   Unable to give time to her has there is still and emergency case process and day shift just arrived and has not determine  The most  Urgent  Cases at this time.

## 2011-05-21 NOTE — ED Notes (Signed)
Patient informed me that she is a retired [employer] and that she knows all.     Patient stated it was my responsibility  To consult Dr Audrie Lia.     I was told it  Is the Dr's reasonability   And Charge RN Barbara Cower confirmed this with.

## 2011-05-25 ENCOUNTER — Observation Stay (HOSPITAL_COMMUNITY)
Admission: EM | Admit: 2011-05-25 | Discharge: 2011-05-26 | Disposition: A | Discharge status: Home Health | Payer: Medicare HMO | Attending: Internal Medicine | Admitting: Internal Medicine

## 2011-05-25 DIAGNOSIS — N186 End stage renal disease: Secondary | ICD-10-CM | POA: Insufficient documentation

## 2011-05-25 DIAGNOSIS — I120 Hypertensive chronic kidney disease with stage 5 chronic kidney disease or end stage renal disease: Secondary | ICD-10-CM | POA: Insufficient documentation

## 2011-05-25 DIAGNOSIS — E785 Hyperlipidemia, unspecified: Secondary | ICD-10-CM | POA: Insufficient documentation

## 2011-05-25 DIAGNOSIS — E119 Type 2 diabetes mellitus without complications: Secondary | ICD-10-CM | POA: Insufficient documentation

## 2011-05-25 DIAGNOSIS — Z992 Dependence on renal dialysis: Principal | ICD-10-CM | POA: Insufficient documentation

## 2011-05-25 MED ORDER — HEPARIN SODIUM (PORCINE) 1000 UNIT/ML DIALYSIS
20.0000 [IU]/kg | INTRAMUSCULAR | Status: DC | PRN
  Filled 2011-05-25: qty 2

## 2011-05-25 MED ORDER — HEPARIN SODIUM (PORCINE) 1000 UNIT/ML DIALYSIS: 20.0000 [IU]/kg | INTRAMUSCULAR | Status: DC | PRN

## 2011-05-25 NOTE — ED Notes (Signed)
Meal tray ordered 

## 2011-05-25 NOTE — ED Notes (Signed)
Dr. Fraser at bedside.

## 2011-05-25 NOTE — H&P (Signed)
PCP:  [name], MD, MD   DOA:  05/25/2011  2:50 PM  Chief Complaint:  Need HD for ESRD  HPI: Pt came to ED to get HD. She tells me she has seen Dr. Bascom Levels and he has put in the orders for HD tonight. She denies any chest pain, shortness of breath, no abdominal or urinary concerns, no headaches or visual changes. She also tells me she will leave after HD and does not want to stay any longer than needed.  Allergies: Allergies  Allergen Reactions  . Codeine Other (See Comments)    Passes out  . Epinephrine Other (See Comments)    Tachycardia, diaphoresis, syncope  . Percocet (Oxycodone-Acetaminophen) Other (See Comments)    Passes  out    Prior to Admission medications   Medication Sig Start Date End Date Taking? Authorizing Provider  allopurinol (ZYLOPRIM) 100 MG tablet Take 100 mg by mouth daily.     Yes Historical Provider, MD  amLODipine (NORVASC) 5 MG tablet Take 5 mg by mouth at bedtime.    Yes Historical Provider, MD  calcium acetate (PHOSLO) 667 MG capsule Take 1,334 mg by mouth 3 (three) times daily with meals.    Yes Historical Provider, MD  Coenzyme Q10 150 MG CAPS Take 300 mg by mouth daily.    Yes Historical Provider, MD  colchicine 0.6 MG tablet Take 0.6 mg by mouth daily as needed. For gout.   Yes Historical Provider, MD  docusate sodium (COLACE) 100 MG capsule Take 200 mg by mouth at bedtime.     Yes Historical Provider, MD  hydrALAZINE (APRESOLINE) 25 MG tablet Take 50 mg by mouth 3 (three) times daily.    Yes Historical Provider, MD  l-methylfolate-B6-B12 (METANX) 3-35-2 MG TABS Take 1 tablet by mouth 2 (two) times daily.    Yes Historical Provider, MD  losartan (COZAAR) 50 MG tablet Take 50 mg by mouth at bedtime.    Yes Historical Provider, MD  metoprolol (TOPROL-XL) 50 MG 24 hr tablet Take 50 mg by mouth at bedtime.    Yes Historical Provider, MD  multivitamin (RENA-VIT) TABS tablet Take 1 tablet by mouth daily.     Yes Historical Provider, MD    repaglinide (PRANDIN) 0.5 MG tablet Take 0.5 mg by mouth 3 (three) times daily before meals.     Yes Historical Provider, MD    Past Medical History  Diagnosis Date  . Chronic kidney disease     esrd  . Diabetes mellitus   . Hyperlipidemia   . Hypertension   . Renal disorder   . Dialysis care     tues, thurs, sat  . 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 Carotid endarterectomy 2002    left  . Btl   . Rectal fissurectomy   . Av fistula placement 10/30/2010    right brachiocephalic   . Fistulogram     Social History:  reports that she has been smoking Cigarettes.  She has a 50 pack-year smoking history. She has never used smokeless tobacco. She reports that she does not drink alcohol or use illicit drugs.  Family History  Problem Relation Age of Onset  . COPD Mother   . Kidney disease Mother   . Heart disease Father   . Lymphoma Son     Review of Systems:  Constitutional: Denies fever, chills, diaphoresis, appetite change and fatigue.  HEENT: Denies photophobia, eye pain, redness, hearing loss, ear  pain, congestion, sore throat, rhinorrhea, sneezing, mouth sores, trouble swallowing, neck pain, neck stiffness and tinnitus.   Respiratory: Denies SOB, DOE, cough, chest tightness,  and wheezing.   Cardiovascular: Denies chest pain, palpitations and leg swelling.  Gastrointestinal: Denies nausea, vomiting, abdominal pain, diarrhea, constipation, blood in stool and abdominal distention.  Genitourinary: Denies dysuria, urgency, frequency, hematuria, flank pain and difficulty urinating.  Musculoskeletal: Denies myalgias, back pain, joint swelling, arthralgias and gait problem.  Skin: Denies pallor, rash and wound.  Neurological: Denies dizziness, seizures, syncope, weakness, light-headedness, numbness and headaches.  Hematological: Denies adenopathy. Easy bruising, personal or family bleeding history  Psychiatric/Behavioral: Denies suicidal  ideation, mood changes, confusion, nervousness, sleep disturbance and agitation   Physical Exam:  Filed Vitals:   05/25/11 1502  BP: 162/72  Pulse: 58  SpO2: 98%    Constitutional: Vital signs reviewed.  Patient is a well-developed and well-nourished in no acute distress and cooperative with exam. Alert and oriented x3.  Head: Normocephalic and atraumatic Ear: TM normal bilaterally Mouth: no erythema or exudates, MMM Eyes: PERRL, EOMI, conjunctivae normal, No scleral icterus.  Neck: Supple, Trachea midline normal ROM, No JVD, mass, thyromegaly, or carotid bruit present.  Cardiovascular: RRR, S1 normal, S2 normal, no MRG, pulses symmetric and intact bilaterally Pulmonary/Chest: CTAB, no wheezes, rales, or rhonchi Abdominal: Soft. Non-tender, non-distended, bowel sounds are normal, no masses, organomegaly, or guarding present.  GU: no CVA tenderness Musculoskeletal: No joint deformities, erythema, or stiffness, ROM full and no nontender Ext: no edema and no cyanosis, pulses palpable bilaterally (DP and PT) Hematology: no cervical, inginal, or axillary adenopathy.  Neurological: A&O x3, Strenght is normal and symmetric bilaterally, cranial nerve II-XII are grossly intact, no focal motor deficit, sensory intact to light touch bilaterally.  Skin: Warm, dry and intact. No rash, cyanosis, or clubbing.  Psychiatric: Normal mood and affect. speech and behavior is normal. Judgment and thought content normal. Cognition and memory are normal.   Labs on Admission:  Results for orders placed during the hospital encounter of 05/25/11 (from the past 48 hour(s))  GLUCOSE, CAPILLARY     Status: Abnormal   Collection Time   05/25/11  6:25 PM      Component Value Range Comment   Glucose-Capillary 103 (*) 70 - 99 (mg/dL)    Comment 1 Documented in Chart      Comment 2 Notify RN       Radiological Exams on Admission: No results found.  Assessment/Plan  ESRD - pt has frequent visits to ED for  same reason - she was seen by Dr. Bascom Levels and was told she will have HD today - pt also has history of leaving AMA and she is planning on doing the same thing after the HD - she is clinically stable and vitals are within her limits - will admit to inpatient and will proceed with HD - I have discussed the need for importance for following up with HD and trying to make it on time per schedules - she is not interested in that conversation tonight  Time Spent on Admission: Over 30 minutes  MAGICK-Kendrea Cerritos 05/25/2011, 6:44 PM [facility], pager #: [phone] Main office number: [phone]

## 2011-05-25 NOTE — ED Notes (Signed)
Pt asleep in recliner, NAD noted, resp even and unlabored

## 2011-05-25 NOTE — Consult Note (Signed)
Pt. In the ER. [facility] to admit. I will write dialysis orders.

## 2011-05-25 NOTE — ED Notes (Signed)
Pt eating meal tray. Aware of plan of care. Warm blanket given.

## 2011-05-25 NOTE — ED Notes (Signed)
Pt asking to speak to charge nurse about wait time for dialysis. Dialysis remains aware of pt.

## 2011-05-25 NOTE — ED Notes (Signed)
Dialysis will call us when they are ready for patient.

## 2011-05-25 NOTE — ED Notes (Signed)
  CBG 103  

## 2011-05-25 NOTE — ED Notes (Signed)
Dialysis, pt denies any issues. No complaints

## 2011-05-25 NOTE — Progress Notes (Signed)
1931:  Nursing note:  Second phone call from patient who is sitting in the ED awaiting to have HD.  Pt called demanding to know "exactly what time" she is going to come up since today will be her 4 th day without HD.  Notified pt that I was currently with a patient at the time and was unable to answer that question.  She said: " Well you should know how many more there are to do, don't you?"  I reminded her that I was currently with a patient at the time and did not currently have access to read the schedule of patients who still were in need of HD.  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 and avoiding her questions".  I told pt that I would let the charge nurse aware of her concerns.  She asked who was the charge nurse and I told her that Moreen Fowler was and I would be glad to relay her concerns.  She stated that she has "called the nurse manager of our HD [facility] unit and of [facility] before and she would be contacting them again if she was made to wait too long tonight."   I personally did not know at the time what that acroynym stood for but as per the internet it can stand for "Adult Children Of Alcoholics".  Ironically the last time I was this patients HD RN we had a very untypical encounter and conversation as the patient was very warm, receptive and pleasant on this date, whereas any previous encounter with this patient she is demeaning, threatening and verbally abusive.   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 The patient spoke very calm and  professional to me and offered to see me in her own outpatient therapy practice as she is familiar with this type of population.  She stated that she would also bring me some handouts/ information at her next HD session regarding this condition.  As she was leaving she even gave me a key chain light that had her office information on it.    1931:  Pt calls back again and speaks with Bethanne Ginger, CCHT.  Again she is questioning what time will she be coming in for HD.  She asked to specifically speak with [facility].  Juanita told pt that Willaim Sheng was off the unit at that time and that I was as well at that time, of which we both truly were.  Pt continued to repeat to [place] that she was not stupid and she knew that there were two nurses there and continued to ask Juanita question after question after question that made Juanita feel uncomfortable.   Juanita told pt that she would relay the information to the charge RN and someone would call for her as soon as we could and that she needed to get off the phone at this time and continue with her work.    I did relay all of  the above to Moreen Fowler, RN.  As per EPIC pt did not have any prior HD labs or CXR performed today in the ED.    Nancy Fetter, RN in Hemodialysis.

## 2011-05-26 ENCOUNTER — Inpatient Hospital Stay (HOSPITAL_COMMUNITY): Payer: Medicare HMO

## 2011-05-26 LAB — RENAL FUNCTION PANEL
BUN: 82 mg/dL — ABNORMAL HIGH (ref 6–23)
Glucose, Bld: 74 mg/dL (ref 70–99)
Phosphorus: 4.1 mg/dL (ref 2.3–4.6)
Potassium: 5.2 mEq/L — ABNORMAL HIGH (ref 3.5–5.1)

## 2011-05-26 LAB — CBC
HCT: 34.6 % — ABNORMAL LOW (ref 36.0–46.0)
Hemoglobin: 11.4 g/dL — ABNORMAL LOW (ref 12.0–15.0)
MCHC: 32.9 g/dL (ref 30.0–36.0)
MCV: 87.6 fL (ref 78.0–100.0)

## 2011-05-26 MED ORDER — HYDRALAZINE HCL 50 MG PO TABS
50.0000 mg | ORAL_TABLET | Freq: Once | ORAL | Status: AC
  Administered 2011-05-26: 50 mg via ORAL
  Filled 2011-05-26: qty 1

## 2011-05-26 MED ORDER — CALCIUM ACETATE 667 MG PO CAPS
667.0000 mg | ORAL_CAPSULE | Freq: Once | ORAL | Status: AC
  Administered 2011-05-26: 667 mg via ORAL
  Filled 2011-05-26: qty 1

## 2011-05-26 MED ORDER — REPAGLINIDE 0.5 MG PO TABS
0.5000 mg | ORAL_TABLET | Freq: Three times a day (TID) | ORAL | Status: DC
  Administered 2011-05-26: 0.5 mg via ORAL
  Filled 2011-05-26 (×4): qty 1

## 2011-05-26 NOTE — ED Notes (Signed)
Pt to dialysis via wc

## 2011-05-26 NOTE — ED Notes (Signed)
Pt continues to be agitated related to wait time. Attempt to appease pt. Pt states you can't help me. Charge nurse aware

## 2011-05-27 ENCOUNTER — Encounter (HOSPITAL_COMMUNITY): Payer: Self-pay | Admitting: *Deleted

## 2011-05-27 ENCOUNTER — Observation Stay (HOSPITAL_COMMUNITY)
Admission: EM | Admit: 2011-05-27 | Discharge: 2011-05-28 | Disposition: A | Discharge status: Hospice | Payer: Medicare HMO | Attending: Internal Medicine | Admitting: Internal Medicine

## 2011-05-27 DIAGNOSIS — Z992 Dependence on renal dialysis: Secondary | ICD-10-CM | POA: Insufficient documentation

## 2011-05-27 DIAGNOSIS — E119 Type 2 diabetes mellitus without complications: Secondary | ICD-10-CM | POA: Insufficient documentation

## 2011-05-27 DIAGNOSIS — N186 End stage renal disease: Secondary | ICD-10-CM | POA: Insufficient documentation

## 2011-05-27 DIAGNOSIS — I120 Hypertensive chronic kidney disease with stage 5 chronic kidney disease or end stage renal disease: Secondary | ICD-10-CM | POA: Insufficient documentation

## 2011-05-27 DIAGNOSIS — F17200 Nicotine dependence, unspecified, uncomplicated: Secondary | ICD-10-CM | POA: Insufficient documentation

## 2011-05-27 DIAGNOSIS — M109 Gout, unspecified: Secondary | ICD-10-CM | POA: Insufficient documentation

## 2011-05-27 DIAGNOSIS — E785 Hyperlipidemia, unspecified: Secondary | ICD-10-CM | POA: Diagnosis present

## 2011-05-27 DIAGNOSIS — D638 Anemia in other chronic diseases classified elsewhere: Secondary | ICD-10-CM | POA: Diagnosis present

## 2011-05-27 MED ORDER — HEPARIN SODIUM (PORCINE) 1000 UNIT/ML DIALYSIS: 20.0000 [IU]/kg | INTRAMUSCULAR | Status: DC | PRN

## 2011-05-27 NOTE — ED Notes (Signed)
The pt is here for dialysis  She was told to come here tonight

## 2011-05-28 ENCOUNTER — Observation Stay (HOSPITAL_COMMUNITY): Payer: Medicare HMO

## 2011-05-28 ENCOUNTER — Encounter (HOSPITAL_COMMUNITY): Payer: Self-pay | Admitting: Internal Medicine

## 2011-05-28 LAB — DIFFERENTIAL
Basophils Relative: 1 % (ref 0–1)
Eosinophils Absolute: 0.2 10*3/uL (ref 0.0–0.7)
Neutrophils Relative %: 44 % (ref 43–77)

## 2011-05-28 LAB — RENAL FUNCTION PANEL
Albumin: 3.6 g/dL (ref 3.5–5.2)
GFR calc Af Amer: 5 mL/min — ABNORMAL LOW (ref >90)
Phosphorus: 4.6 mg/dL (ref 2.3–4.6)
Potassium: 5 mEq/L (ref 3.5–5.1)
Sodium: 141 mEq/L (ref 135–145)

## 2011-05-28 LAB — CBC
MCH: 28.8 pg (ref 26.0–34.0)
MCHC: 32.6 g/dL (ref 30.0–36.0)
Platelets: 198 10*3/uL (ref 150–400)
RDW: 20.4 % — ABNORMAL HIGH (ref 11.5–15.5)

## 2011-05-28 MED ORDER — PARICALCITOL 5 MCG/ML IV SOLN
INTRAVENOUS | Status: AC
  Filled 2011-05-28: qty 1

## 2011-05-28 MED ORDER — PARICALCITOL 5 MCG/ML IV SOLN
1.0000 ug | Freq: Once | INTRAVENOUS | Status: AC
  Administered 2011-05-28: 1 ug via INTRAVENOUS

## 2011-05-28 NOTE — ED Notes (Signed)
[name] from dialysis  Will be ready for this pt in approx 1 hour if no emergencies come in.

## 2011-05-28 NOTE — ED Notes (Signed)
Multiple calls have been made to the dialysis rn since the pt arrived.  He has been  Performing dialysis on an icu pt and has not been able to take yet.  Last call now

## 2011-05-28 NOTE — H&P (Signed)
PCP:   [name], MD, MD    Chief Complaint:   Here for HD  HPI: Alexandra [name] is a 66 y.o. female   has a past medical history of Chronic kidney disease; Diabetes mellitus; Hyperlipidemia; Hypertension; Renal disorder; Dialysis care; and Gout due to renal impairment.   Presented with  Needing to have dialysis. Gets HD on Tuesday Thursday and Saturday.   Review of Systems:    Pertinent positives include: mild leg edema  Constitutional:  No weight loss, night sweats, Fevers, chills, fatigue, weight loss  HEENT:  No headaches, Difficulty swallowing,Tooth/dental problems,Sore throat,  No sneezing, itching, ear ache, nasal congestion, post nasal drip,  Cardio-vascular:  No chest pain, Orthopnea, PND, anasarca, dizziness, palpitations.no Bilateral lower extremity swelling  GI:  No heartburn, indigestion, abdominal pain, nausea, vomiting, diarrhea, change in bowel habits, loss of appetite, melena, blood in stool, hematoemesis Resp:  no shortness of breath at rest. No dyspnea on exertion, No excess mucus, no productive cough, No non-productive cough, No coughing up of blood.No change in color of mucus.No wheezing. Skin:  no rash or lesions. No jaundice GU:  no dysuria, change in color of urine, no urgency or frequency. No straining to urinate.  No flank pain.  Musculoskeletal:  No joint pain or no joint swelling. No decreased range of motion. No back pain.  Psych:  No change in mood or affect. No depression or anxiety. No memory loss.  Neuro: no localizing neurological complaints, no tingling, no weakness, no double vision, no gait abnormality, no slurred speech, no confusion  Otherwise ROS are negative except for above, 10 systems were reviewed  Past Medical History: Past Medical History  Diagnosis Date  . Chronic kidney disease     esrd  . Diabetes mellitus   . Hyperlipidemia   . Hypertension   . Renal disorder   . Dialysis care     tues, thurs, sat  .  Gout due to renal impairment    Past Surgical History  Procedure Date  . Carotid endarterectomy 2002    left  . Btl   . Rectal fissurectomy   . Av fistula placement 10/30/2010    right brachiocephalic   . Fistulogram      Medications: Prior to Admission medications   Medication Sig Start Date End Date Taking? Authorizing Provider  allopurinol (ZYLOPRIM) 100 MG tablet Take 100 mg by mouth daily.     Yes Historical Provider, MD  amLODipine (NORVASC) 5 MG tablet Take 5 mg by mouth at bedtime.    Yes Historical Provider, MD  calcium acetate (PHOSLO) 667 MG capsule Take 1,334 mg by mouth 3 (three) times daily with meals.    Yes Historical Provider, MD  Coenzyme Q10 150 MG CAPS Take 300 mg by mouth daily.    Yes Historical Provider, MD  colchicine 0.6 MG tablet Take 0.6 mg by mouth daily as needed. For gout.   Yes Historical Provider, MD  docusate sodium (COLACE) 100 MG capsule Take 200 mg by mouth at bedtime.     Yes Historical Provider, MD  hydrALAZINE (APRESOLINE) 25 MG tablet Take 50 mg by mouth 3 (three) times daily.    Yes Historical Provider, MD  l-methylfolate-B6-B12 (METANX) 3-35-2 MG TABS Take 1 tablet by mouth 2 (two) times daily.    Yes Historical Provider, MD  losartan (COZAAR) 50 MG tablet Take 50 mg by mouth at bedtime.    Yes Historical Provider, MD  metoprolol (TOPROL-XL) 50 MG 24 hr tablet  Take 50 mg by mouth at bedtime.    Yes Historical Provider, MD  multivitamin (RENA-VIT) TABS tablet Take 1 tablet by mouth daily.     Yes Historical Provider, MD  repaglinide (PRANDIN) 0.5 MG tablet Take 0.5 mg by mouth 3 (three) times daily before meals.     Yes Historical Provider, MD    Allergies:   Allergies  Allergen Reactions  . Codeine Other (See Comments)    Passes out  . Epinephrine Other (See Comments)    Tachycardia, diaphoresis, syncope  . Percocet (Oxycodone-Acetaminophen) Other (See Comments)    Passes  out    Social History:    reports that she has been  smoking Cigarettes.  She has a 50 pack-year smoking history. She has never used smokeless tobacco. She reports that she does not drink alcohol or use illicit drugs.   Family History: family history includes COPD in her mother; Heart disease in her father; Kidney disease in her mother; and Lymphoma in her son.    Physical Exam: Patient Vitals for the past 24 hrs:  BP Temp Temp src Pulse Resp SpO2  05/28/11 0256 114/55 mmHg - - 91  24  96 %  05/27/11 2321 204/66 mmHg 97.3 F (36.3 C) Oral 89  18  96 %    1. General:  in No Acute distress 2. Psychological: Alert and  Oriented 3. Head/ENT:   Moist  Mucous Membranes                          Head Non traumatic, neck supple                          Normal Dentition 4. SKIN: normal  Skin turgor,  Skin clean Dry and intact no rash 5. Heart: Regular rate and rhythm no Murmur, Rub or gallop 6. Lungs: Clear to auscultation bilaterally, no wheezes or crackles   7. Abdomen: Soft, non-tender, Non distended 8. Lower extremities: no clubbing, cyanosis, mild edema 9. Neurologically Grossly intact, moving all 4 extremities equally 10. MSK: Normal range of motion  body mass index is unknown because there is no height or weight on file.   Labs on Admission:   [facility] 05/26/11 0453  NA 137  K 5.2*  CL 97  CO2 22  GLUCOSE 74  BUN 82*  CREATININE 11.77*  CALCIUM 9.6  MG --  PHOS 4.1    Basename 05/26/11 0453  AST --  ALT --  ALKPHOS --  BILITOT --  PROT --  ALBUMIN 3.7   No results found for this basename: LIPASE:2,AMYLASE:2 in the last 72 hours  Basename 05/26/11 0452  WBC 7.9  NEUTROABS --  HGB 11.4*  HCT 34.6*  MCV 87.6  PLT 192   No results found for this basename: CKTOTAL:3,CKMB:3,CKMBINDEX:3,TROPONINI:3 in the last 72 hours No results found for this basename: TSH,T4TOTAL,FREET3,T3FREE,THYROIDAB in the last 72 hours No results found for this basename: VITAMINB12:2,FOLATE:2,FERRITIN:2,TIBC:2,IRON:2,RETICCTPCT:2 in the  last 72 hours Lab Results  Component Value Date   HGBA1C 5.4 05/16/2011    The CrCl is unknown because both a height and weight (above a minimum accepted value) are required for this calculation. ABG    Component Value Date/Time   TCO2 29 06/10/2010 0739     No results found for this basename: DDIMER     Cultures:    Component Value Date/Time   SDES URINE, CLEAN [facility] 08/20/2009 2114  SPECREQUEST NONE 08/20/2009 2114   CULT NO GROWTH 08/20/2009 2114   REPTSTATUS 08/22/2009 FINAL 08/20/2009 2114       Radiological Exams on Admission: No results found.  Assessment/Plan 69 y here to get HD  Present on Admission:  ESRD - Needs HD dialysis   Prophylaxis: SCD   CODE STATUS: Full CODE  I have spent a total of 30 min on this admition  Amour Cutrone 05/28/2011, 4:59 AM

## 2011-05-28 NOTE — Progress Notes (Signed)
Utilization review completed.  

## 2011-05-28 NOTE — ED Notes (Signed)
PT. WAITING FOR HOSPITALIST TO ADMIT PT. BEFORE GOING TO DIALYSIS.

## 2011-05-28 NOTE — ED Provider Notes (Signed)
History     CSN: [id number]  Arrival date & time 05/27/11  2301   First MD Initiated Contact with Patient 05/28/11 [phone]      Chief Complaint  Patient presents with  . needs dialysis     (Consider location/radiation/quality/duration/timing/severity/associated sxs/prior treatment) HPI Comments: Patient dialyzes on Tuesdays Thursdays and Saturdays, told that she needed to come to the hospital tonight for dialysis. She has no symptoms, no headache, no chest pain, no fevers.  The history is provided by the patient and medical records.    Past Medical History  Diagnosis Date  . Chronic kidney disease     esrd  . Diabetes mellitus   . Hyperlipidemia   . Hypertension   . Renal disorder   . Dialysis care     tues, thurs, sat  . 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 Carotid endarterectomy 2002    left  . Btl   . Rectal fissurectomy   . Av fistula placement 10/30/2010    right brachiocephalic   . Fistulogram     Family History  Problem Relation Age of Onset  . COPD Mother   . Kidney disease Mother   . Heart disease Father   . Lymphoma Son     History  Substance Use Topics  . Smoking status: Current Everyday Smoker -- 1.0 packs/day for 50 years    Types: Cigarettes  . Smokeless tobacco: Never Used  . Alcohol Use: No    OB History    Grav Para Term Preterm Abortions TAB SAB Ect Mult Living                  Review of Systems  All other systems reviewed and are negative.    Allergies  Codeine; Epinephrine; and Percocet  Home Medications   Current Outpatient Rx  Name Route Sig Dispense Refill  . ALLOPURINOL 100 MG PO TABS Oral Take 100 mg by mouth daily.      Marland Kitchen AMLODIPINE BESYLATE 5 MG PO TABS Oral Take 5 mg by mouth at bedtime.     Marland Kitchen CALCIUM ACETATE 667 MG PO CAPS Oral Take 1,334 mg by mouth 3 (three) times daily with meals.     Marland Kitchen COENZYME Q10 150 MG PO CAPS Oral Take 300 mg by mouth daily.     . COLCHICINE 0.6 MG  PO TABS Oral Take 0.6 mg by mouth daily as needed. For gout.    Marland Kitchen DOCUSATE SODIUM 100 MG PO CAPS Oral Take 200 mg by mouth at bedtime.      Marland Kitchen HYDRALAZINE HCL 25 MG PO TABS Oral Take 50 mg by mouth 3 (three) times daily.     . L-METHYLFOLATE-B6-B12 3-35-2 MG PO TABS Oral Take 1 tablet by mouth 2 (two) times daily.     Marland Kitchen LOSARTAN POTASSIUM 50 MG PO TABS Oral Take 50 mg by mouth at bedtime.     Marland Kitchen METOPROLOL SUCCINATE ER 50 MG PO TB24 Oral Take 50 mg by mouth at bedtime.     Marland Kitchen RENA-VITE PO TABS Oral Take 1 tablet by mouth daily.      Marland Kitchen REPAGLINIDE 0.5 MG PO TABS Oral Take 0.5 mg by mouth 3 (three) times daily before meals.        BP 114/55  Pulse 91  Temp(Src) 97.3 F (36.3 C) (Oral)  Resp 24  SpO2 96%  Physical Exam  Nursing note and vitals reviewed. Constitutional: She appears  well-developed and well-nourished. No distress.  Eyes: Conjunctivae are normal. Right eye exhibits no discharge. Left eye exhibits no discharge. No scleral icterus.  Neck: Normal range of motion. Neck supple.  Neurological: She is alert. Coordination normal.  Skin: Skin is warm and dry. She is not diaphoretic.    ED Course  Procedures (including critical care time)  Labs Reviewed - No data to display No results found.   1. End stage renal disease       MDM  The patient has hypertension, is been accepted to dialysis and will go there at this time. No acute findings on exam, patient gives minimal history on evaluation, states "I am here all the time and most people know me"        Vida Roller, MD 05/28/11 [phone]

## 2011-05-28 NOTE — ED Notes (Signed)
TRANSPORTED TO DIALYSIS IN STABLE CONDITION .

## 2011-05-29 ENCOUNTER — Emergency Department (HOSPITAL_COMMUNITY)
Admission: EM | Admit: 2011-05-29 | Discharge: 2011-05-30 | Disposition: A | Discharge status: Hospice | Payer: Medicare HMO | Attending: Emergency Medicine | Admitting: Emergency Medicine

## 2011-05-29 DIAGNOSIS — Z79899 Other long term (current) drug therapy: Secondary | ICD-10-CM | POA: Insufficient documentation

## 2011-05-29 DIAGNOSIS — E785 Hyperlipidemia, unspecified: Secondary | ICD-10-CM | POA: Insufficient documentation

## 2011-05-29 DIAGNOSIS — Z8639 Personal history of other endocrine, nutritional and metabolic disease: Secondary | ICD-10-CM | POA: Insufficient documentation

## 2011-05-29 DIAGNOSIS — N186 End stage renal disease: Secondary | ICD-10-CM | POA: Insufficient documentation

## 2011-05-29 DIAGNOSIS — D638 Anemia in other chronic diseases classified elsewhere: Secondary | ICD-10-CM | POA: Diagnosis present

## 2011-05-29 DIAGNOSIS — I120 Hypertensive chronic kidney disease with stage 5 chronic kidney disease or end stage renal disease: Secondary | ICD-10-CM | POA: Insufficient documentation

## 2011-05-29 DIAGNOSIS — E119 Type 2 diabetes mellitus without complications: Secondary | ICD-10-CM | POA: Insufficient documentation

## 2011-05-29 DIAGNOSIS — N2581 Secondary hyperparathyroidism of renal origin: Secondary | ICD-10-CM | POA: Diagnosis present

## 2011-05-29 DIAGNOSIS — Z862 Personal history of diseases of the blood and blood-forming organs and certain disorders involving the immune mechanism: Secondary | ICD-10-CM | POA: Insufficient documentation

## 2011-05-29 DIAGNOSIS — F17200 Nicotine dependence, unspecified, uncomplicated: Secondary | ICD-10-CM | POA: Insufficient documentation

## 2011-05-29 DIAGNOSIS — Z992 Dependence on renal dialysis: Secondary | ICD-10-CM | POA: Insufficient documentation

## 2011-05-30 ENCOUNTER — Observation Stay (HOSPITAL_COMMUNITY)
Admission: EM | Admit: 2011-05-30 | Discharge: 2011-05-31 | DRG: 682 | Disposition: A | Discharge status: Home / Self Care | Payer: Medicare HMO | Attending: Internal Medicine | Admitting: Internal Medicine

## 2011-05-30 ENCOUNTER — Encounter (HOSPITAL_COMMUNITY): Payer: Self-pay | Admitting: *Deleted

## 2011-05-30 ENCOUNTER — Emergency Department (HOSPITAL_COMMUNITY): Payer: Medicare HMO

## 2011-05-30 DIAGNOSIS — Z888 Allergy status to other drugs, medicaments and biological substances status: Secondary | ICD-10-CM

## 2011-05-30 DIAGNOSIS — F17200 Nicotine dependence, unspecified, uncomplicated: Secondary | ICD-10-CM | POA: Diagnosis present

## 2011-05-30 DIAGNOSIS — N186 End stage renal disease: Secondary | ICD-10-CM | POA: Diagnosis present

## 2011-05-30 DIAGNOSIS — I120 Hypertensive chronic kidney disease with stage 5 chronic kidney disease or end stage renal disease: Principal | ICD-10-CM | POA: Diagnosis present

## 2011-05-30 DIAGNOSIS — D638 Anemia in other chronic diseases classified elsewhere: Secondary | ICD-10-CM | POA: Diagnosis present

## 2011-05-30 DIAGNOSIS — D631 Anemia in chronic kidney disease: Secondary | ICD-10-CM | POA: Diagnosis present

## 2011-05-30 DIAGNOSIS — M109 Gout, unspecified: Secondary | ICD-10-CM | POA: Diagnosis present

## 2011-05-30 DIAGNOSIS — E119 Type 2 diabetes mellitus without complications: Secondary | ICD-10-CM | POA: Diagnosis present

## 2011-05-30 DIAGNOSIS — E785 Hyperlipidemia, unspecified: Secondary | ICD-10-CM | POA: Diagnosis present

## 2011-05-30 LAB — DIFFERENTIAL
Basophils Absolute: 0 10*3/uL (ref 0.0–0.1)
Eosinophils Absolute: 0.1 10*3/uL (ref 0.0–0.7)
Monocytes Absolute: 0.8 10*3/uL (ref 0.1–1.0)
Neutrophils Relative %: 59 % (ref 43–77)

## 2011-05-30 LAB — RENAL FUNCTION PANEL
Calcium: 9.2 mg/dL (ref 8.4–10.5)
GFR calc Af Amer: 4 mL/min — ABNORMAL LOW (ref >90)
GFR calc non Af Amer: 4 mL/min — ABNORMAL LOW (ref >90)
Phosphorus: 3.6 mg/dL (ref 2.3–4.6)
Sodium: 134 mEq/L — ABNORMAL LOW (ref 135–145)

## 2011-05-30 LAB — CBC
MCH: 28.5 pg (ref 26.0–34.0)
MCHC: 32.6 g/dL (ref 30.0–36.0)
Platelets: 146 10*3/uL — ABNORMAL LOW (ref 150–400)

## 2011-05-30 LAB — GLUCOSE, CAPILLARY

## 2011-05-30 MED ORDER — LIDOCAINE HCL (PF) 1 % IJ SOLN: 5.0000 mL | INTRAMUSCULAR | Status: DC | PRN

## 2011-05-30 MED ORDER — SODIUM CHLORIDE 0.9 % IJ SOLN: 3.0000 mL | INTRAMUSCULAR | Status: DC | PRN

## 2011-05-30 MED ORDER — CALCIUM ACETATE 667 MG PO CAPS
667.0000 mg | ORAL_CAPSULE | Freq: Three times a day (TID) | ORAL | Status: DC
  Administered 2011-05-30: 667 mg via ORAL
  Filled 2011-05-30 (×3): qty 1

## 2011-05-30 MED ORDER — INSULIN ASPART 100 UNIT/ML ~~LOC~~ SOLN: 0.0000 [IU] | Freq: Three times a day (TID) | SUBCUTANEOUS | Status: DC

## 2011-05-30 MED ORDER — PENTAFLUOROPROP-TETRAFLUOROETH EX AERO: 1.0000 "application " | INHALATION_SPRAY | CUTANEOUS | Status: DC | PRN

## 2011-05-30 MED ORDER — SODIUM CHLORIDE 0.9 % IV SOLN: 100.0000 mL | INTRAVENOUS | Status: DC | PRN

## 2011-05-30 MED ORDER — INSULIN ASPART 100 UNIT/ML ~~LOC~~ SOLN: 0.0000 [IU] | Freq: Every day | SUBCUTANEOUS | Status: DC

## 2011-05-30 MED ORDER — ALTEPLASE 2 MG IJ SOLR
2.0000 mg | Freq: Once | INTRAMUSCULAR | Status: AC | PRN
  Filled 2011-05-30: qty 2

## 2011-05-30 MED ORDER — PARICALCITOL 5 MCG/ML IV SOLN
INTRAVENOUS | Status: AC
  Administered 2011-05-30: 5 ug
  Filled 2011-05-30: qty 1

## 2011-05-30 MED ORDER — NEPRO/CARBSTEADY PO LIQD: 237.0000 mL | ORAL | Status: DC | PRN

## 2011-05-30 MED ORDER — ONDANSETRON HCL 8 MG PO TABS: 4.0000 mg | ORAL_TABLET | Freq: Four times a day (QID) | ORAL | Status: DC | PRN

## 2011-05-30 MED ORDER — AMLODIPINE BESYLATE 5 MG PO TABS
5.0000 mg | ORAL_TABLET | Freq: Every day | ORAL | Status: DC
  Administered 2011-05-31: 5 mg via ORAL
  Filled 2011-05-30 (×2): qty 1

## 2011-05-30 MED ORDER — ALTEPLASE 2 MG IJ SOLR: 2.0000 mg | Freq: Once | INTRAMUSCULAR | Status: DC | PRN

## 2011-05-30 MED ORDER — ZOLPIDEM TARTRATE 5 MG PO TABS: 5.0000 mg | ORAL_TABLET | Freq: Every evening | ORAL | Status: DC | PRN

## 2011-05-30 MED ORDER — SODIUM CHLORIDE 0.9 % IJ SOLN: 3.0000 mL | Freq: Two times a day (BID) | INTRAMUSCULAR | Status: DC

## 2011-05-30 MED ORDER — HEPARIN SODIUM (PORCINE) 1000 UNIT/ML DIALYSIS: 1000.0000 [IU] | INTRAMUSCULAR | Status: DC | PRN

## 2011-05-30 MED ORDER — HEPARIN SODIUM (PORCINE) 1000 UNIT/ML DIALYSIS: 20.0000 [IU]/kg | INTRAMUSCULAR | Status: DC | PRN

## 2011-05-30 MED ORDER — ACETAMINOPHEN 650 MG RE SUPP: 650.0000 mg | Freq: Four times a day (QID) | RECTAL | Status: DC | PRN

## 2011-05-30 MED ORDER — PARICALCITOL 5 MCG/ML IV SOLN
1.0000 ug | Freq: Once | INTRAVENOUS | Status: AC
  Filled 2011-05-30: qty 0.2

## 2011-05-30 MED ORDER — LOSARTAN POTASSIUM 50 MG PO TABS
50.0000 mg | ORAL_TABLET | Freq: Once | ORAL | Status: AC
  Administered 2011-05-31: 50 mg via ORAL
  Filled 2011-05-30: qty 1

## 2011-05-30 MED ORDER — LIDOCAINE-PRILOCAINE 2.5-2.5 % EX CREA: 1.0000 "application " | TOPICAL_CREAM | CUTANEOUS | Status: DC | PRN

## 2011-05-30 MED ORDER — NEPRO/CARBSTEADY PO LIQD
237.0000 mL | ORAL | Status: DC | PRN
  Filled 2011-05-30: qty 237

## 2011-05-30 MED ORDER — HEPARIN SODIUM (PORCINE) 1000 UNIT/ML DIALYSIS
20.0000 [IU]/kg | INTRAMUSCULAR | Status: DC | PRN
  Filled 2011-05-30: qty 2

## 2011-05-30 MED ORDER — ENOXAPARIN SODIUM 30 MG/0.3ML ~~LOC~~ SOLN: 30.0000 mg | SUBCUTANEOUS | Status: DC

## 2011-05-30 MED ORDER — ONDANSETRON HCL 4 MG/2ML IJ SOLN: 4.0000 mg | Freq: Four times a day (QID) | INTRAMUSCULAR | Status: DC | PRN

## 2011-05-30 MED ORDER — METOPROLOL TARTRATE 50 MG PO TABS
50.0000 mg | ORAL_TABLET | Freq: Once | ORAL | Status: AC
  Administered 2011-05-31: 50 mg via ORAL
  Filled 2011-05-30: qty 1

## 2011-05-30 MED ORDER — REPAGLINIDE 0.5 MG PO TABS
0.5000 mg | ORAL_TABLET | Freq: Three times a day (TID) | ORAL | Status: DC
  Administered 2011-05-30: 0.5 mg via ORAL
  Filled 2011-05-30: qty 1

## 2011-05-30 MED ORDER — SODIUM CHLORIDE 0.9 % IV SOLN: 250.0000 mL | INTRAVENOUS | Status: DC | PRN

## 2011-05-30 MED ORDER — ACETAMINOPHEN 325 MG PO TABS: 650.0000 mg | ORAL_TABLET | Freq: Four times a day (QID) | ORAL | Status: DC | PRN

## 2011-05-30 MED ORDER — HYDROMORPHONE HCL PF 1 MG/ML IJ SOLN: 0.5000 mg | INTRAMUSCULAR | Status: DC | PRN

## 2011-05-30 NOTE — Progress Notes (Signed)
Primary RN, Damon called me and stated that he had finally called Dr. Bascom Levels and Dr. Bascom Levels refused to give HD orders for the pt to be done at this time--> Dr. Bascom Levels told him to admit pt and he would round on her in the morning and write orders-->  Explained to primary rn that the unit will go down at 10a and if the pt was not on the machine by 545a at the latest, she would not be finished in time and would have to be run as a separate--> he stated he understood but Dr. Bascom Levels refused to write orders at this time and was adamant the pt be admitted and wait for him to round in the morning.--> this call was witnessed by the tech Bethanne Ginger, as she answered the phone first as I was in the middle of rinsing back another pt and she took the information first until I was free enough to take the call--> I explained to the primary nurse that the pt most likely would refuse to be admitted and he said he understood

## 2011-05-30 NOTE — H&P (Addendum)
DATE OF ADMISSION:  05/30/2011  PCP:    [name], MD, MD   RENAL:  Dr. Jeri Cos  Chief Complaint: Dialysis   HPI: Alexandra [name] is an 66 y.o. female who presents to the Ed to get her routine dialysis treatments through Dr. Jeri Cos.  She is without complaints a this time. She receives dialysis on Tuesdays, Thursdays, and Saturdays.    Past Medical History  Diagnosis Date  . Chronic kidney disease     esrd  . Diabetes mellitus   . Hyperlipidemia   . Hypertension   . Renal disorder   . Dialysis care     tues, thurs, sat  . 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 Carotid endarterectomy 2002    left  . Btl   . Rectal fissurectomy   . Av fistula placement 10/30/2010    right brachiocephalic   . Fistulogram     Medications:  HOME MEDS: Prior to Admission medications   Medication Sig Start Date End Date Taking? Authorizing Provider  allopurinol (ZYLOPRIM) 100 MG tablet Take 100 mg by mouth daily.     Yes Historical Provider, MD  amLODipine (NORVASC) 5 MG tablet Take 5 mg by mouth at bedtime.    Yes Historical Provider, MD  calcium acetate (PHOSLO) 667 MG capsule Take 1,334 mg by mouth 3 (three) times daily with meals.    Yes Historical Provider, MD  Coenzyme Q10 150 MG CAPS Take 300 mg by mouth daily.    Yes Historical Provider, MD  colchicine 0.6 MG tablet Take 0.6 mg by mouth daily as needed. For gout.   Yes Historical Provider, MD  docusate sodium (COLACE) 100 MG capsule Take 200 mg by mouth at bedtime.     Yes Historical Provider, MD  hydrALAZINE (APRESOLINE) 25 MG tablet Take 50 mg by mouth 3 (three) times daily.    Yes Historical Provider, MD  l-methylfolate-B6-B12 (METANX) 3-35-2 MG TABS Take 1 tablet by mouth 2 (two) times daily.    Yes Historical Provider, MD  losartan (COZAAR) 50 MG tablet Take 50 mg by mouth at bedtime.    Yes Historical Provider, MD  metoprolol (TOPROL-XL) 50 MG 24 hr tablet Take  50 mg by mouth at bedtime.    Yes Historical Provider, MD  multivitamin (RENA-VIT) TABS tablet Take 1 tablet by mouth daily.     Yes Historical Provider, MD  OVER THE COUNTER MEDICATION Take 60-90 mLs by mouth daily. OTC. QiNopal   Yes Historical Provider, MD  repaglinide (PRANDIN) 0.5 MG tablet Take 0.5 mg by mouth 3 (three) times daily before meals.     Yes Historical Provider, MD    Allergies:  Allergies  Allergen Reactions  . Codeine Other (See Comments)    Passes out  . Epinephrine Other (See Comments)    Tachycardia, diaphoresis, syncope  . Percocet (Oxycodone-Acetaminophen) Other (See Comments)    Passes  out    Social History:   reports that she has been smoking Cigarettes.  She has a 50 pack-year smoking history. She has never used smokeless tobacco. She reports that she does not drink alcohol or use illicit drugs.  Family History: Family History  Problem Relation Age of Onset  . COPD Mother   . Kidney disease Mother   . Heart disease Father   . Lymphoma Son     Review of Systems:  The patient denies anorexia, fever, weight loss,, vision loss, decreased  hearing, hoarseness, chest pain, syncope, dyspnea on exertion, peripheral edema, balance deficits, hemoptysis, abdominal pain, melena, hematochezia, severe indigestion/heartburn, hematuria, incontinence, genital sores, muscle weakness, suspicious skin lesions, transient blindness, difficulty walking, depression, unusual weight change, abnormal bleeding, enlarged lymph nodes, angioedema, and breast masses.   Physical Exam:  GEN:  Pleasant examined  and in no acute distress; cooperative with exam Filed Vitals:   05/29/11 2354 05/30/11 0119  BP: 145/132 186/57  Pulse: 95 89  Temp: 98.6 F (37 C) 98.7 F (37.1 C)  TempSrc: Oral Oral  Resp: 20 18  SpO2: 93% 96%   Blood pressure 186/57, pulse 89, temperature 98.7 F (37.1 C), temperature source Oral, resp. rate 18, SpO2 96.00%. PSYCH: She is alert and oriented x4;  does not appear anxious does not appear depressed; affect is normal HEENT: Normocephalic and Atraumatic, Mucous membranes pink; PERRLA; EOM intact; Fundi:  Benign;  No scleral icterus, Nares: Patent, Oropharynx: Clear, Fair Dentition, Neck:  FROM, no cervical lymphadenopathy nor thyromegaly or carotid bruit; no JVD; Breasts:: Not examined CHEST WALL: No tenderness CHEST: Normal respiration, clear to auscultation bilaterally HEART: Regular rate and rhythm; no murmurs rubs or gallops BACK: No kyphosis or scoliosis; no CVA tenderness ABDOMEN: Positive Bowel Sounds, Scaphoid,  Soft, non-tender; no masses, no organomegaly. Rectal Exam: Not done EXTREMITIES: No cyanosis, clubbing or edema; no ulcerations. Genitalia: not examined PULSES: 2+ and symmetric SKIN: Normal hydration no rash or ulceration CNS: Cranial nerves 2-12 grossly intact no focal neurologic deficit   Labs & Imaging Results for orders placed during the hospital encounter of 05/27/11 (from the past 48 hour(s))  CBC     Status: Abnormal   Collection Time   05/28/11  8:07 AM      Component Value Range Comment   WBC 5.9  4.0 - 10.5 (K/uL)    RBC 4.00  3.87 - 5.11 (MIL/uL)    Hemoglobin 11.5 (*) 12.0 - 15.0 (g/dL)    HCT 04.5 (*) 40.9 - 46.0 (%)    MCV 88.3  78.0 - 100.0 (fL)    MCH 28.8  26.0 - 34.0 (pg)    MCHC 32.6  30.0 - 36.0 (g/dL)    RDW 81.1 (*) 91.4 - 15.5 (%)    Platelets 198  150 - 400 (K/uL)   DIFFERENTIAL     Status: Abnormal   Collection Time   05/28/11  8:07 AM      Component Value Range Comment   Neutrophils Relative 44  43 - 77 (%)    Neutro Abs 2.6  1.7 - 7.7 (K/uL)    Lymphocytes Relative 40  12 - 46 (%)    Lymphs Abs 2.3  0.7 - 4.0 (K/uL)    Monocytes Relative 14 (*) 3 - 12 (%)    Monocytes Absolute 0.8  0.1 - 1.0 (K/uL)    Eosinophils Relative 3  0 - 5 (%)    Eosinophils Absolute 0.2  0.0 - 0.7 (K/uL)    Basophils Relative 1  0 - 1 (%)    Basophils Absolute 0.0  0.0 - 0.1 (K/uL)   RENAL FUNCTION  PANEL     Status: Abnormal   Collection Time   05/28/11  8:07 AM      Component Value Range Comment   Sodium 141  135 - 145 (mEq/L)    Potassium 5.0  3.5 - 5.1 (mEq/L)    Chloride 101  96 - 112 (mEq/L)    CO2 25  19 - 32 (mEq/L)  Glucose, Bld 58 (*) 70 - 99 (mg/dL)    BUN 57 (*) 6 - 23 (mg/dL)    Creatinine, Ser 1.61 (*) 0.50 - 1.10 (mg/dL)    Calcium 9.4  8.4 - 10.5 (mg/dL)    Phosphorus 4.6  2.3 - 4.6 (mg/dL)    Albumin 3.6  3.5 - 5.2 (g/dL)    GFR calc non Af Amer 4 (*) >90 (mL/min)    GFR calc Af Amer 5 (*) >90 (mL/min)    No results found.    Assessment/Plan: Present on Admission:  .End stage renal disease on dialysis .HTN (hypertension) .DM (diabetes mellitus) .Hyperparathyroidism, secondary renal .Hyperlipidemia .Anemia of chronic disease .Gout   Plan:  Admit to Observation status. Notify Dialysis/Dr. Bascom Levels Reconcile meds DVT prophylaxis Other plans as per orders.    CODE STATUS:      FULL CODE      Pamela Maddy C 05/30/2011, 3:24 AM

## 2011-05-30 NOTE — Progress Notes (Signed)
Pt continues to talk on cell phone with "daughter" (doctor) on speaker phone so that I am sure to hear her conversation, tells her that there is another pt on and they were not emergent and they were on before her.  What pt is not telling her "daughter" is that the 2nd pt in the unit was put on the machine while we were waiting on pt to be worked up through the ED and get to unit.  Pt continues to tell "daughter" that we are intimidating her but what actually is happening that through this conversation on the speaker phone, the pt is passive aggressively trying to intimidate this nurse.  This pt is impeding her own care.

## 2011-05-30 NOTE — ED Notes (Signed)
Spoke with bed control.  Advised them that the dialysis RN stated that the dialysis area will be undergoing some renovations/rearranging this morning, so they will not be able to dialyze the patient in the dialysis area unless she begins her treatment in the next few minutes.  Since the patient does NOT have dialysis orders yet, the dialysis RN advised the patient would be dialyzed in her room separately.  Dialysis RN further advised that the currently assigned floor ([address]00) does not have the necessary adapters that allow the renal nurses to dialyze patients in the room.  Advised bed control that the dialysis believed this process required a step down bed (or higher).

## 2011-05-30 NOTE — Progress Notes (Signed)
Pt came into unit at approximately 2020 demanding to be placed on the machine immediately, c/o having to wait so long.  Pt was manipulative from the beginning wanting to know if my other pt in the bay beside her was my "emergent' pt, I told the pt that I could not speak about other pts and reminded her about HIPPA.  She then wanted to know about our staffing today and why we were here on a Sunday.  I again explained to the pt that this was not something that was relevant to her HD tx and all conversation would have to be relevant to pt's tx.  Pt began threatening me and reminding me of who she was and she would definitely have this situation taken care of once she met with [name] later this week and assured me that we would all pay.  Pt continues to be non-compliant with her tx in the unit and covers her access during tx, always wants the machine turned toward her and I just caught her pushing the buttons on the machine.  I explained to her that she could not do this and she reminded me that she was her own nurse.  Will cont to monitor the situation and provide excellent pt care

## 2011-05-30 NOTE — Progress Notes (Signed)
Patient verbally threatened to report this nurse in her meeting with [name] because this nurse, in an attempt to give efficient nursing care, asked the patient to list all meds she was requesting me to call Dr. Bascom Levels with b/c I was wanting to make only one call for this issue to get it taken care of efficiently and not have to call the dr. Multiple times for the same reason.  The patient misconstrued the words I said and said she would not be threatened and would report me for abandonment.  When I further explained to the pt that she misunderstood what i said that I was trying to provide efficient nursing care by only calling the dr. Once as it was late and as she evidenced last night it was hard to reach him once it was late at night, i wanted to address all her needs at this time.  She stated that it was unprofessional bedside manner for me to ask her what all her needs were so that I could only call the dr once b/c he is her dr and whatever she wants or needs at whatever time no matter how many times she wants me to call him, this nurse is to call him no matter what and if I don't, "It's called negligence".  No amount of trying to explain that I was trying to provide the best patient centered care I could would change her mind, as she stated, she is right, I am wrong, end of story.

## 2011-05-30 NOTE — ED Provider Notes (Addendum)
Patient re-screened for HD.  Since last night, no new complaints or pain.  She is awaiting HD. Patient admitted to [facility] regular floor bed.  Gerhard Munch, MD 05/30/11 4782  Gerhard Munch, MD 05/30/11 2142

## 2011-05-30 NOTE — ED Provider Notes (Signed)
History     CSN: [id number]  Arrival date & time 05/29/11  2337   First MD Initiated Contact with Patient 05/30/11 0048      Chief Complaint  Patient presents with  . Vascular Access Problem     The history is provided by the patient.  osnet- today Course - stable Associated symptoms - none Worsened by - nothing Improved by - nothing  Pt here for diaysis Denies SOB She reports she feels well  Past Medical History  Diagnosis Date  . Chronic kidney disease     esrd  . Diabetes mellitus   . Hyperlipidemia   . Hypertension   . Renal disorder   . Dialysis care     tues, thurs, sat  . 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 Carotid endarterectomy 2002    left  . Btl   . Rectal fissurectomy   . Av fistula placement 10/30/2010    right brachiocephalic   . Fistulogram     Family History  Problem Relation Age of Onset  . COPD Mother   . Kidney disease Mother   . Heart disease Father   . Lymphoma Son     History  Substance Use Topics  . Smoking status: Current Everyday Smoker -- 1.0 packs/day for 50 years    Types: Cigarettes  . Smokeless tobacco: Never Used  . Alcohol Use: No    OB History    Grav Para Term Preterm Abortions TAB SAB Ect Mult Living                  Review of Systems  Constitutional: Negative for fever.  Respiratory: Negative for shortness of breath.     Allergies  Codeine; Epinephrine; and Percocet  Home Medications   Current Outpatient Rx  Name Route Sig Dispense Refill  . ALLOPURINOL 100 MG PO TABS Oral Take 100 mg by mouth daily.      Marland Kitchen AMLODIPINE BESYLATE 5 MG PO TABS Oral Take 5 mg by mouth at bedtime.     Marland Kitchen CALCIUM ACETATE 667 MG PO CAPS Oral Take 1,334 mg by mouth 3 (three) times daily with meals.     Marland Kitchen COENZYME Q10 150 MG PO CAPS Oral Take 300 mg by mouth daily.     . COLCHICINE 0.6 MG PO TABS Oral Take 0.6 mg by mouth daily as needed. For gout.    Marland Kitchen DOCUSATE SODIUM 100 MG PO CAPS  Oral Take 200 mg by mouth at bedtime.      Marland Kitchen HYDRALAZINE HCL 25 MG PO TABS Oral Take 50 mg by mouth 3 (three) times daily.     . L-METHYLFOLATE-B6-B12 3-35-2 MG PO TABS Oral Take 1 tablet by mouth 2 (two) times daily.     Marland Kitchen LOSARTAN POTASSIUM 50 MG PO TABS Oral Take 50 mg by mouth at bedtime.     Marland Kitchen METOPROLOL SUCCINATE ER 50 MG PO TB24 Oral Take 50 mg by mouth at bedtime.     Marland Kitchen RENA-VITE PO TABS Oral Take 1 tablet by mouth daily.      Marland Kitchen OVER THE COUNTER MEDICATION Oral Take 60-90 mLs by mouth daily. OTC. QiNopal    . REPAGLINIDE 0.5 MG PO TABS Oral Take 0.5 mg by mouth 3 (three) times daily before meals.        BP 145/132  Pulse 95  Temp(Src) 98.6 F (37 C) (Oral)  Resp 20  SpO2 93%  Physical Exam CONSTITUTIONAL: Well developed/well nourished HEAD AND FACE: Normocephalic/atraumatic EYES: EOMI ENMT: Mucous membranes moist NECK: supple no meningeal signs LUNGS: , no apparent distress ABDOMEN: soft, nontender, no rebound or guarding NEURO: Pt is awake/alert, moves all extremitiesx4 SKIN: warm, color normal PSYCH: no abnormalities of mood noted  ED Course  Procedures   1:21 AM Pt here for dialysis, stable at this time, no distress noted  D/w dr Lovell Sheehan will admit for dialysis   MDM  Nursing notes reviewed and considered in documentation Previous records reviewed and considered         Joya Gaskins, MD 05/30/11 0155

## 2011-05-30 NOTE — ED Notes (Signed)
She needs dialysis.  She was here last pm and she did not  Get dialysis.  Dialysis will be reqady for her in 15 minutes

## 2011-05-30 NOTE — Progress Notes (Signed)
Someone from the ED called to make [name] aware that this pt was in the Ed--> instructed them to call us back when the pt has HD orders and we would prepare the machine and get ready for her--> the person said ok

## 2011-05-30 NOTE — ED Notes (Signed)
Nephrologist paged to give ordered related to dialysis treatment.

## 2011-05-30 NOTE — ED Notes (Signed)
Al RN from Dialysis called informing this RN that the Dialysis unit would be closed today at 1000 a for all day today d/t the unit and equipment are in the process of terminal cleaning. Al RN reports previous RN was made aware that the unit would be closed for the day and any order to dialysis should have been placed last night.

## 2011-05-30 NOTE — Progress Notes (Signed)
Had House Supervisor come to department as pt continues to passive aggressively intimidate this nurse by telling mistruths and stories about what is happening in this department and trying to manipulate me into giving her the info she wants to  Make sure whoever she is talking to on the phone hears what is being said.  HS stated that there was really nothing she could do about it when I asked her could we have her d/c her phone use during tx, but assured me that I was doing nothing wrong and just continue to document as i have been.

## 2011-05-30 NOTE — ED Notes (Signed)
This RN spoke w/on call nephrology, they will not d/c pt per pt's request, pt will return at 5:30 pm for dialysis

## 2011-05-30 NOTE — ED Notes (Signed)
Pt says she is here for dialysis. Dialysis unit notified the patient is here

## 2011-05-30 NOTE — Progress Notes (Signed)
Patient ID: Alexandra [name], female   DOB: 07/04/1945, 66 y.o.   MRN: [id number] Pt. The ER  [facility] will admit  I will write dialysis orders.

## 2011-05-30 NOTE — Progress Notes (Signed)
Called the ED again and spoke with the primary nurse, Damon--> he said he had Dr. Bascom Levels paged and still had no HD orders--> I told him "I specifically expressed the need to call Dr. Bascom Levels on his cell and the unlikelihood that he would return a page this late b/c he specifically wants to be called on his cell.  Now we are 3 hr into this and still have no orders and he doesn't even know she is here"--> he got the cell phone number from me and stated he would call him at that time

## 2011-05-30 NOTE — Consult Note (Signed)
Dialysis  Orders written.

## 2011-05-30 NOTE — Progress Notes (Signed)
Secretary from [facility] called me to ask me was pt coming directly to me before she came to the floor--> at this point I was assuming that the pt had decided to be admitted but I was never told directly that she was--> I explained to the secretary that No, we do not have HD orders for her and that this was supposed to have been explained to the pt by the primary nurse and we were not expecting her to stay to be admitted--> so if she was agreeing to the admission, she would be coming to their floor and not our unit b/c Dr. Bascom Levels refused to write any orders on her until he rounded in the morning--> she sounded confused and stated she had to call the ED back

## 2011-05-30 NOTE — ED Notes (Signed)
Per Dr. Bebe Shaggy, Dr. Bascom Levels stated 1) he wants to have the patient admitted to the floor and 2) he will write orders in the morning (i.e. NOT now).

## 2011-05-30 NOTE — Progress Notes (Signed)
Called the ED again and spoke with the primary RN, Damon--> asked him how much longer it would be before we got the pt--> he stated an order had been placed for hospitalist consult--> I asked if Dr. Bascom Levels had been called and he said he assumed the secretary did--> explained that Dr. Marya Landry prefers to be called on his cell rather than be paged and he may not answer page because of this-->I asked him if he followed up w/ the secretary b/c this was a time sensitive issue-->he said he was covering 12 pts and would love to give me what I needed but he was a little tied up--> I reiterated the importance--> he then took phone to hospitalist Dr. Jola Babinski I explained to him that I have known about the pt for 2hr now and have been prepared to run pt and Dr. Marya Landry has not even been called yet-->  While i was still on the phone with him he told primary rn to call Dr. Marya Landry

## 2011-05-30 NOTE — Progress Notes (Signed)
Called ED and spoke w/ Primary RN, Damon--> he stated that he just got her back in the room to him and he really wasn't sure what had been done--> explained to primary RN that time was of the essence and we were ready for the pt at any time-->  He said ok

## 2011-05-30 NOTE — Progress Notes (Signed)
The pt herself called the unit at this time--> asked me was I ready for her--> I explained that we had no orders for her to be done at this time--> she said she knew that, but had we had orders at 3a.m when Dr. Bascom Levels was called, would we have been ready to do her--> I explained that yes we were prepared-->she then asked when could we have run her--> I then directed her to speak with her primary nurse as all communications concerning her tx were communicated through her primary nurse--> pt became verbally aggressive and abusive and stated that "I am my own primary nurse and advocate.  So, Dr. Bascom Levels refused to write orders?  Is that what he said to you?" --> I told her that, that was something that she would have to ask her primary nurse about.  That again, all communication concerning her HD tx had been communicated through and to her primary nurse and if she had questions she would need to direct those to her primary nurse. --> At this point she got more angry and stated that "I called Dr. Bascom Levels myself.  I know what he said to me.  I am asking YOU, what did he tell you?  Did he tell you that he refused to write orders?" --> ( Dr. Bascom Levels had not communicated with the HD unit at all; all communication concerning her tx had been communicated through and with her primary nurse)-->  I again redirected her to direct any questions she had to her primary nurse and that was the best that I could answer her questions at this time-->  She again increased in verbal abuse and aggression and stated, "Well, this sounds like retaliatory and discriminatory practice that's going on here" -->  She reminded me that she was documenting everything and that she was going to put a stop to this and we would pay for this and reminded me once more of how important a person she is and she will take care of this once and for all.

## 2011-05-30 NOTE — H&P (Signed)
Alexandra [name] is an 66 y.o. female.   Nephrologist - Dr.Frazier. Chief Complaint: Has come for dialysis. HPI: 66 year-old female who usually gets dialyzed on Tuesday Thursday and Saturdays has come for her regular dialysis. Patient has no complaints and specifically denies any chest pain shortness of breath nausea vomiting or abdominal pain or any dizziness or loss of consciousness. Patient at this time is in the dialysis and getting dialyzed through orders from Dr. Bascom Levels.  Past Medical History  Diagnosis Date  . Chronic kidney disease     esrd  . Diabetes mellitus   . Hyperlipidemia   . Hypertension   . Renal disorder   . Dialysis care     tues, thurs, sat  . 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 Carotid endarterectomy 2002    left  . Btl   . Rectal fissurectomy   . Av fistula placement 10/30/2010    right brachiocephalic   . Fistulogram     Family History  Problem Relation Age of Onset  . COPD Mother   . Kidney disease Mother   . Heart disease Father   . Lymphoma Son    Social History:  reports that she has been smoking Cigarettes.  She has a 50 pack-year smoking history. She has never used smokeless tobacco. She reports that she does not drink alcohol or use illicit drugs.  Allergies:  Allergies  Allergen Reactions  . Codeine Other (See Comments)    Passes out  . Epinephrine Other (See Comments)    Tachycardia, diaphoresis, syncope  . Percocet (Oxycodone-Acetaminophen) Other (See Comments)    Passes  out    Medications Prior to Admission  Medication Dose Route Frequency Provider Last Rate Last Dose  . 0.9 %  sodium chloride infusion  100 mL Intravenous PRN Alexandra Cos, MD      . 0.9 %  sodium chloride infusion  100 mL Intravenous PRN Alexandra Cos, MD      . alteplase (CATHFLO ACTIVASE) injection 2 mg  2 mg Intracatheter Once PRN Alexandra Cos, MD      . calcium acetate (PHOSLO) capsule 667 mg  667 mg Oral  TID WC Alexandra Cos, MD   667 mg at 05/30/11 2226  . feeding supplement (NEPRO CARB STEADY) liquid 237 mL  237 mL Oral PRN Alexandra Cos, MD      . heparin injection 1,200 Units  20 Units/kg Dialysis PRN Alexandra Cos, MD      . heparin injection 1,200 Units  20 Units/kg Dialysis PRN Alexandra Cos, MD      . paricalcitol (ZEMPLAR) 5 MCG/ML injection        5 mcg at 05/30/11 2230  . paricalcitol (ZEMPLAR) injection 1 mcg  1 mcg Intravenous Once in dialysis Alexandra Cos, MD      . repaglinide [name]) tablet 0.5 mg  0.5 mg Oral TID Alexandra [name] Alexandra Cos, MD   0.5 mg at 05/30/11 2227  . DISCONTD: 0.9 %  sodium chloride infusion  250 mL Intravenous PRN Ron Parker, MD      . DISCONTD: 0.9 %  sodium chloride infusion  100 mL Intravenous PRN Alexandra Cos, MD      . DISCONTD: acetaminophen (TYLENOL) suppository 650 mg  650 mg Rectal Q6H PRN Ron Parker, MD      . DISCONTD: acetaminophen (TYLENOL) tablet 650 mg  650 mg Oral Q6H PRN Ron Parker, MD      .  DISCONTD: alteplase (CATHFLO ACTIVASE) injection 2 mg  2 mg Intracatheter Once PRN Alexandra Cos, MD      . DISCONTD: enoxaparin (LOVENOX) injection 30 mg  30 mg Subcutaneous Q24H Ron Parker, MD      . DISCONTD: feeding supplement (NEPRO CARB STEADY) liquid 237 mL  237 mL Oral PRN Alexandra Cos, MD      . DISCONTD: heparin injection 1,000 Units  1,000 Units Dialysis PRN Alexandra Cos, MD      . DISCONTD: heparin injection 1,000 Units  1,000 Units Dialysis PRN Alexandra Cos, MD      . DISCONTD: heparin injection 20 Units/kg  20 Units/kg Dialysis PRN Alexandra Cos, MD      . DISCONTD: HYDROmorphone (DILAUDID) injection 0.5-1 mg  0.5-1 mg Intravenous Q3H PRN Ron Parker, MD      . DISCONTD: insulin aspart (novoLOG) injection 0-5 Units  0-5 Units Subcutaneous QHS Harvette Velora Heckler, MD      . DISCONTD: insulin aspart (novoLOG) injection 0-9 Units  0-9 Units Subcutaneous TID WC Harvette Velora Heckler, MD       . DISCONTD: lidocaine (XYLOCAINE) 1 % injection 5 mL  5 mL Intradermal PRN Alexandra Cos, MD      . DISCONTD: lidocaine-prilocaine (EMLA) cream 1 application  1 application Topical PRN Alexandra Cos, MD      . DISCONTD: ondansetron (ZOFRAN) injection 4 mg  4 mg Intravenous Q6H PRN Ron Parker, MD      . DISCONTD: ondansetron (ZOFRAN) tablet 4 mg  4 mg Oral Q6H PRN Ron Parker, MD      . DISCONTD: pentafluoroprop-tetrafluoroeth (GEBAUERS) aerosol 1 application  1 application Topical PRN Alexandra Cos, MD      . DISCONTD: sodium chloride 0.9 % injection 3 mL  3 mL Intravenous Q12H Harvette Velora Heckler, MD      . DISCONTD: sodium chloride 0.9 % injection 3 mL  3 mL Intravenous PRN Ron Parker, MD      . DISCONTD: zolpidem (AMBIEN) tablet 5 mg  5 mg Oral QHS PRN Ron Parker, MD       Medications Prior to Admission  Medication Sig Dispense Refill  . allopurinol (ZYLOPRIM) 100 MG tablet Take 100 mg by mouth daily.        Marland Kitchen amLODipine (NORVASC) 5 MG tablet Take 5 mg by mouth at bedtime.       . calcium acetate (PHOSLO) 667 MG capsule Take 1,334 mg by mouth 3 (three) times daily with meals.       . Coenzyme Q10 150 MG CAPS Take 300 mg by mouth daily.       . colchicine 0.6 MG tablet Take 0.6 mg by mouth daily as needed. For gout.      . docusate sodium (COLACE) 100 MG capsule Take 200 mg by mouth at bedtime.        . hydrALAZINE (APRESOLINE) 25 MG tablet Take 50 mg by mouth 3 (three) times daily.       Marland Kitchen l-methylfolate-B6-B12 (METANX) 3-35-2 MG TABS Take 1 tablet by mouth 2 (two) times daily.       Marland Kitchen losartan (COZAAR) 50 MG tablet Take 50 mg by mouth at bedtime.       . metoprolol (TOPROL-XL) 50 MG 24 hr tablet Take 50 mg by mouth at bedtime.       . multivitamin (RENA-VIT) TABS tablet Take 1 tablet by mouth daily.        Marland Kitchen OVER THE COUNTER  MEDICATION Take 60-90 mLs by mouth daily. OTC. QiNopal      . repaglinide (PRANDIN) 0.5 MG tablet Take 0.5 mg by mouth 3  (three) times daily before meals.          Results for orders placed during the hospital encounter of 05/30/11 (from the past 48 hour(s))  CBC     Status: Abnormal   Collection Time   05/30/11  8:14 PM      Component Value Range Comment   WBC 5.4  4.0 - 10.5 (K/uL)    RBC 4.00  3.87 - 5.11 (MIL/uL)    Hemoglobin 11.4 (*) 12.0 - 15.0 (g/dL)    HCT 16.1 (*) 09.6 - 46.0 (%)    MCV 87.5  78.0 - 100.0 (fL)    MCH 28.5  26.0 - 34.0 (pg)    MCHC 32.6  30.0 - 36.0 (g/dL)    RDW 04.5 (*) 40.9 - 15.5 (%)    Platelets 146 (*) 150 - 400 (K/uL)   DIFFERENTIAL     Status: Abnormal   Collection Time   05/30/11  8:14 PM      Component Value Range Comment   Neutrophils Relative 59  43 - 77 (%)    Lymphocytes Relative 26  12 - 46 (%)    Monocytes Relative 14 (*) 3 - 12 (%)    Eosinophils Relative 1  0 - 5 (%)    Basophils Relative 0  0 - 1 (%)    Neutro Abs 3.1  1.7 - 7.7 (K/uL)    Lymphs Abs 1.4  0.7 - 4.0 (K/uL)    Monocytes Absolute 0.8  0.1 - 1.0 (K/uL)    Eosinophils Absolute 0.1  0.0 - 0.7 (K/uL)    Basophils Absolute 0.0  0.0 - 0.1 (K/uL)    Smear Review MORPHOLOGY UNREMARKABLE     RENAL FUNCTION PANEL     Status: Abnormal   Collection Time   05/30/11  8:14 PM      Component Value Range Comment   Sodium 134 (*) 135 - 145 (mEq/L)    Potassium 5.2 (*) 3.5 - 5.1 (mEq/L)    Chloride 95 (*) 96 - 112 (mEq/L)    CO2 23  19 - 32 (mEq/L)    Glucose, Bld 204 (*) 70 - 99 (mg/dL)    BUN 66 (*) 6 - 23 (mg/dL)    Creatinine, Ser 8.11 (*) 0.50 - 1.10 (mg/dL)    Calcium 9.2  8.4 - 10.5 (mg/dL)    Phosphorus 3.6  2.3 - 4.6 (mg/dL)    Albumin 3.5  3.5 - 5.2 (g/dL)    GFR calc non Af Amer 4 (*) >90 (mL/min)    GFR calc Af Amer 4 (*) >90 (mL/min)    No results found.  Review of Systems  Constitutional: Negative.   HENT: Negative.   Eyes: Negative.   Respiratory: Negative.   Cardiovascular: Negative.   Gastrointestinal: Negative.   Genitourinary: Negative.   Musculoskeletal: Negative.     Skin: Negative.   Neurological: Negative.   Endo/Heme/Allergies: Negative.   Psychiatric/Behavioral: Negative.     Blood pressure 190/59, pulse 91, temperature 98.4 F (36.9 C), temperature source Oral, resp. rate 21, weight 59.3 kg (130 lb 11.7 oz), SpO2 99.00%. Physical Exam  Constitutional: She is oriented to person, place, and time. She appears well-developed and well-nourished. No distress.  HENT:  Head: Normocephalic and atraumatic.  Right Ear: External ear normal.  Left Ear: External ear normal.  Eyes:  Conjunctivae are normal. Pupils are equal, round, and reactive to light.  Neck: Normal range of motion. Neck supple.  Cardiovascular: Normal rate and regular rhythm.   Respiratory: Effort normal and breath sounds normal.  GI: Soft. Bowel sounds are normal.  Musculoskeletal: Normal range of motion. She exhibits no edema and no tenderness.  Neurological: She is alert and oriented to person, place, and time.       Moves all extremities.  Skin: Skin is warm and dry. No rash noted. She is not diaphoretic. No erythema.  Psychiatric: Her behavior is normal.     Assessment/Plan #1. ESRD on hemodialysis. #2. Diabetes mellitus2. #3. Hypertension. #4. Anemia secondary to ESRD.  Plan: At this time patient will be admitted for observation. Patient is getting dialyzed through orders from Dr. Bascom Levels. We will continue her home medications. Patient probably will be discharged home if stable after dialysis.  Dequarius Jeffries N. 05/30/2011, 11:20 PM

## 2011-05-31 NOTE — Progress Notes (Signed)
Pt being d/c out of unit at this moment and as she left she asked me was i working Tuesday.  I obliged her with the answer that I would not be here.  She asked me who was and I told her that I was not able to answer that at this time.  She told me to let whomever is working that night to know that she must be ran early b/c she has a meeting on Wed at 9a.m. With [name] about putting a stop to her retaliatory and discriminatory tx that she is experiencing and she wants to be off the machine in plenty enough time to make it to that meeting b/c everyone is going to be there and she was friends with them and she was going to make sure that we were put in our place for her tx .  I told her that we can not guarantee any tx times d/t the nature of this business but I would pass her concern along to mgmt and day shift personnel and that if there was anyway that they could accommodate her they would.

## 2011-05-31 NOTE — Progress Notes (Signed)
Utilization review completed.  

## 2011-05-31 NOTE — Discharge Summary (Signed)
Brief [facility]ad hospitalist discharge note. History of present illness. This side pleasant 66 year old female comes to  for her Tuesday Thursday Saturday dialysis. She usually comes, is admitted, is dialyzed, and then leaves for home. She was admitted for dialysis session tonight by Doctor Toniann Fail. She has completed dialysis now and is requesting to be discharged home. I tried to encourage the patient to remain overnight as Doctor Toniann Fail wished but the patient declines to stay. She appears medically stable and in no distress. Vital signs temperature 100.4, pulse 92, respiration 23, blood pressure 197/69. O2 sats 95%. General appearance pleasant elderly female in no distress. She is alert, cooperative, oriented. Cardiac. Rate and rhythm regular. Lungs. Clear and equal. Abdomen. Soft with positive bowel sounds. No pain. Extremities. Mild pedal edema. No calf pain and negative Homans. Impression/plan Problem #1 end-stage renal disease with chronic hemodialysis. The patient appears medically stable her exam and vital signs. She declines to stay overnight for observation post dialysis. Will continue to follow with her nephrologist Dr. Bascom Levels as an outpatient and continue her home medications. A discharge order has been completed.

## 2011-06-01 ENCOUNTER — Emergency Department (HOSPITAL_COMMUNITY)
Admission: EM | Admit: 2011-06-01 | Discharge: 2011-06-02 | Disposition: A | Discharge status: Home / Self Care | Payer: Medicare HMO | Attending: Emergency Medicine | Admitting: Emergency Medicine

## 2011-06-01 ENCOUNTER — Encounter (HOSPITAL_COMMUNITY): Payer: Self-pay | Admitting: Emergency Medicine

## 2011-06-01 ENCOUNTER — Emergency Department (HOSPITAL_COMMUNITY): Admit: 2011-06-01 | Discharge: 2011-06-01 | Disposition: A | Discharge status: Home / Self Care | Payer: Medicare HMO

## 2011-06-01 DIAGNOSIS — E785 Hyperlipidemia, unspecified: Secondary | ICD-10-CM | POA: Insufficient documentation

## 2011-06-01 DIAGNOSIS — N186 End stage renal disease: Secondary | ICD-10-CM | POA: Insufficient documentation

## 2011-06-01 DIAGNOSIS — I120 Hypertensive chronic kidney disease with stage 5 chronic kidney disease or end stage renal disease: Secondary | ICD-10-CM | POA: Insufficient documentation

## 2011-06-01 DIAGNOSIS — Z992 Dependence on renal dialysis: Secondary | ICD-10-CM | POA: Insufficient documentation

## 2011-06-01 DIAGNOSIS — F17200 Nicotine dependence, unspecified, uncomplicated: Secondary | ICD-10-CM | POA: Insufficient documentation

## 2011-06-01 DIAGNOSIS — M7989 Other specified soft tissue disorders: Secondary | ICD-10-CM | POA: Insufficient documentation

## 2011-06-01 DIAGNOSIS — Z9889 Other specified postprocedural states: Secondary | ICD-10-CM | POA: Insufficient documentation

## 2011-06-01 DIAGNOSIS — E119 Type 2 diabetes mellitus without complications: Secondary | ICD-10-CM | POA: Insufficient documentation

## 2011-06-01 LAB — CBC
HCT: 31.9 % — ABNORMAL LOW (ref 36.0–46.0)
MCHC: 32.9 g/dL (ref 30.0–36.0)
RDW: 19.5 % — ABNORMAL HIGH (ref 11.5–15.5)

## 2011-06-01 LAB — DIFFERENTIAL
Basophils Absolute: 0 10*3/uL (ref 0.0–0.1)
Basophils Relative: 1 % (ref 0–1)
Monocytes Absolute: 0.8 10*3/uL (ref 0.1–1.0)
Neutro Abs: 2.2 10*3/uL (ref 1.7–7.7)
Neutrophils Relative %: 54 % (ref 43–77)

## 2011-06-01 LAB — RENAL FUNCTION PANEL
Albumin: 3.1 g/dL — ABNORMAL LOW (ref 3.5–5.2)
Chloride: 98 mEq/L (ref 96–112)
Creatinine, Ser: 7.4 mg/dL — ABNORMAL HIGH (ref 0.50–1.10)
GFR calc non Af Amer: 5 mL/min — ABNORMAL LOW (ref >90)
Phosphorus: 3 mg/dL (ref 2.3–4.6)

## 2011-06-01 MED ORDER — HEPARIN SODIUM (PORCINE) 1000 UNIT/ML DIALYSIS: 1000.0000 [IU] | INTRAMUSCULAR | Status: DC | PRN

## 2011-06-01 MED ORDER — SODIUM CHLORIDE 0.9 % IV SOLN: 100.0000 mL | INTRAVENOUS | Status: DC | PRN

## 2011-06-01 MED ORDER — LIDOCAINE HCL (PF) 1 % IJ SOLN: 5.0000 mL | INTRAMUSCULAR | Status: DC | PRN

## 2011-06-01 MED ORDER — CYCLOBENZAPRINE HCL 10 MG PO TABS
5.0000 mg | ORAL_TABLET | Freq: Once | ORAL | Status: AC
  Administered 2011-06-01: 5 mg via ORAL
  Filled 2011-06-01: qty 0.5

## 2011-06-01 MED ORDER — ALTEPLASE 2 MG IJ SOLR: 2.0000 mg | Freq: Once | INTRAMUSCULAR | Status: AC | PRN

## 2011-06-01 MED ORDER — HEPARIN SODIUM (PORCINE) 1000 UNIT/ML DIALYSIS
20.0000 [IU]/kg | INTRAMUSCULAR | Status: DC | PRN
  Administered 2011-06-01: 1240 [IU] via INTRAVENOUS_CENTRAL

## 2011-06-01 MED ORDER — PENTAFLUOROPROP-TETRAFLUOROETH EX AERO: 1.0000 "application " | INHALATION_SPRAY | CUTANEOUS | Status: DC | PRN

## 2011-06-01 MED ORDER — HEPARIN SODIUM (PORCINE) 1000 UNIT/ML DIALYSIS: 20.0000 [IU]/kg | INTRAMUSCULAR | Status: DC | PRN

## 2011-06-01 MED ORDER — LIDOCAINE-PRILOCAINE 2.5-2.5 % EX CREA: 1.0000 "application " | TOPICAL_CREAM | CUTANEOUS | Status: DC | PRN

## 2011-06-01 MED ORDER — NEPRO/CARBSTEADY PO LIQD: 237.0000 mL | ORAL | Status: DC | PRN

## 2011-06-01 NOTE — Consult Note (Signed)
  Pt. In the ER swelling of the ankles.    See orders.pt to be dialyzed  Today. Note completed.

## 2011-06-01 NOTE — ED Notes (Signed)
Paged Dr. Bascom Levels to [phone]

## 2011-06-01 NOTE — ED Notes (Signed)
Triage note, primary assessment made by myself not Elizabeth Sauer, tech

## 2011-06-01 NOTE — ED Notes (Signed)
Pt here for dialysis,  Dialysis unit st's they are ready once orders are received from MD.  Dr. Bascom Levels paged.

## 2011-06-01 NOTE — Consult Note (Signed)
Pt. Is in the ER dialysis orders are being written.

## 2011-06-03 ENCOUNTER — Observation Stay (HOSPITAL_COMMUNITY)
Admission: EM | Admit: 2011-06-03 | Discharge: 2011-06-04 | Discharge status: Against Medical Advice | Payer: Medicare HMO | Attending: Internal Medicine | Admitting: Internal Medicine

## 2011-06-03 ENCOUNTER — Other Ambulatory Visit: Payer: Self-pay | Admitting: Nephrology

## 2011-06-03 ENCOUNTER — Encounter (HOSPITAL_COMMUNITY): Payer: Self-pay | Admitting: Emergency Medicine

## 2011-06-03 DIAGNOSIS — Z992 Dependence on renal dialysis: Principal | ICD-10-CM | POA: Insufficient documentation

## 2011-06-03 DIAGNOSIS — I120 Hypertensive chronic kidney disease with stage 5 chronic kidney disease or end stage renal disease: Secondary | ICD-10-CM | POA: Insufficient documentation

## 2011-06-03 DIAGNOSIS — D631 Anemia in chronic kidney disease: Secondary | ICD-10-CM | POA: Insufficient documentation

## 2011-06-03 DIAGNOSIS — N186 End stage renal disease: Secondary | ICD-10-CM | POA: Insufficient documentation

## 2011-06-03 DIAGNOSIS — D638 Anemia in other chronic diseases classified elsewhere: Secondary | ICD-10-CM | POA: Diagnosis present

## 2011-06-03 DIAGNOSIS — E785 Hyperlipidemia, unspecified: Secondary | ICD-10-CM | POA: Insufficient documentation

## 2011-06-03 DIAGNOSIS — M109 Gout, unspecified: Secondary | ICD-10-CM | POA: Insufficient documentation

## 2011-06-03 DIAGNOSIS — E119 Type 2 diabetes mellitus without complications: Secondary | ICD-10-CM | POA: Insufficient documentation

## 2011-06-03 LAB — GLUCOSE, CAPILLARY: Glucose-Capillary: 44 mg/dL — CL (ref 70–99)

## 2011-06-03 NOTE — ED Provider Notes (Signed)
History     CSN: [id number]  Arrival date & time 06/03/11  2026   First MD Initiated Contact with Patient 06/03/11 2139      Chief Complaint  Patient presents with  . Vascular Access Problem    (Consider location/radiation/quality/duration/timing/severity/associated sxs/prior treatment) HPI Comments: Patient here for hemodialysis.  She is a Tuesday Thursday Saturday dialysis patient and gets admitted through the emergency department via [facility].  Patient has no complaints at this time and denies headache, nausea, vomiting, lightheadedness, chest pain, shortness of breath, fevers, night sweats, chills or cough.   The history is provided by the patient and medical records.    Past Medical History  Diagnosis Date  . Chronic kidney disease     esrd  . Diabetes mellitus   . Hyperlipidemia   . Hypertension   . Renal disorder   . Dialysis care     tues, thurs, sat  . 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 Carotid endarterectomy 2002    left  . Btl   . Rectal fissurectomy   . Av fistula placement 10/30/2010    right brachiocephalic   . Fistulogram     Family History  Problem Relation Age of Onset  . COPD Mother   . Kidney disease Mother   . Heart disease Father   . Lymphoma Son     History  Substance Use Topics  . Smoking status: Current Everyday Smoker -- 1.0 packs/day for 50 years    Types: Cigarettes  . Smokeless tobacco: Never Used  . Alcohol Use: No    OB History    Grav Para Term Preterm Abortions TAB SAB Ect Mult Living                  Review of Systems  Constitutional: Negative for fever, chills and appetite change.  HENT: Negative for congestion.   Eyes: Negative for visual disturbance.  Respiratory: Negative for shortness of breath.   Cardiovascular: Negative for chest pain and leg swelling.  Gastrointestinal: Negative for abdominal pain.  Genitourinary: Negative for dysuria, urgency and frequency.    Neurological: Negative for dizziness, syncope, weakness, light-headedness, numbness and headaches.  Psychiatric/Behavioral: Negative for confusion.  All other systems reviewed and are negative.    Allergies  Codeine; Epinephrine; and Percocet  Home Medications   No current outpatient prescriptions on file.  BP 168/64  Pulse 95  Temp(Src) 98.4 F (36.9 C) (Oral)  Resp 16  SpO2 91%  Physical Exam  Nursing note and vitals reviewed. Constitutional: She is oriented to person, place, and time. She appears well-developed and well-nourished. No distress.  HENT:  Head: Normocephalic and atraumatic.  Eyes: Conjunctivae and EOM are normal.  Neck: Normal range of motion.  Cardiovascular:       RRR 2 + pitting edema bilaterally   Pulmonary/Chest: Effort normal. She has no wheezes. She has no rales.       LCAB, NAD  Musculoskeletal: Normal range of motion.  Neurological: She is alert and oriented to person, place, and time.  Skin: Skin is warm and dry. No rash noted. She is not diaphoretic.  Psychiatric: She has a normal mood and affect. Her behavior is normal.    ED Course  Procedures (including critical care time)  Labs Reviewed  GLUCOSE, CAPILLARY - Abnormal; Notable for the following:    Glucose-Capillary 44 (*)    All other components within normal limits  CBC - Abnormal;  Notable for the following:    RBC 3.82 (*)    Hemoglobin 10.7 (*)    HCT 32.6 (*)    RDW 19.2 (*)    Platelets 144 (*)    All other components within normal limits  DIFFERENTIAL - Abnormal; Notable for the following:    Monocytes Relative 19 (*)    All other components within normal limits  RENAL FUNCTION PANEL - Abnormal; Notable for the following:    Chloride 95 (*)    Glucose, Bld 68 (*)    BUN 42 (*)    Creatinine, Ser 8.19 (*)    Albumin 3.1 (*)    GFR calc non Af Amer 4 (*)    GFR calc Af Amer 5 (*)    All other components within normal limits  GLUCOSE, CAPILLARY  GLUCOSE, CAPILLARY   GLUCOSE, CAPILLARY   No results found.   1. Dialysis patient       MDM  Hypoglycemia, dialysis  Pt w CBG of 44. Given meal. No alt mental status, lightheaded ness, confusion or nausea. Will re check CBG at 10:45  [facility] paged to admit for Dr. Bascom Levels for HD.         Jaci Carrel, [place] 06/04/11 [phone]

## 2011-06-03 NOTE — H&P (Signed)
Alexandra [name] is an 66 y.o. female.   PCP - Dr.Ross. Nephrologist - Dr.Frazier. Chief Complaint: Has come for dialysis. HPI: 66 year-old female with known history of ESRD on hemodialysis, diabetes mellitus type 2, hyperlipidemia, hypertension has come for dialysis. Patient denies any chest pain shortness of breath nausea vomiting abdominal pain fever chills. Patient does have some sinus congestion over the last 2 days. Patient at this time is being admitted for dialysis.  Past Medical History  Diagnosis Date  . Chronic kidney disease     esrd  . Diabetes mellitus   . Hyperlipidemia   . Hypertension   . Renal disorder   . Dialysis care     tues, thurs, sat  . 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 Carotid endarterectomy 2002    left  . Btl   . Rectal fissurectomy   . Av fistula placement 10/30/2010    right brachiocephalic   . Fistulogram     Family History  Problem Relation Age of Onset  . COPD Mother   . Kidney disease Mother   . Heart disease Father   . Lymphoma Son    Social History:  reports that she has been smoking Cigarettes.  She has a 50 pack-year smoking history. She has never used smokeless tobacco. She reports that she does not drink alcohol or use illicit drugs.  Allergies:  Allergies  Allergen Reactions  . Codeine Other (See Comments)    Passes out  . Epinephrine Other (See Comments)    Tachycardia, diaphoresis, syncope  . Percocet (Oxycodone-Acetaminophen) Other (See Comments)    Passes  out    Medications Prior to Admission  Medication Dose Route Frequency Provider Last Rate Last Dose  . heparin injection 1,200 Units  20 Units/kg Dialysis PRN Jeri Cos, MD      . DISCONTD: 0.9 %  sodium chloride infusion  100 mL Intravenous PRN Jeri Cos, MD      . DISCONTD: 0.9 %  sodium chloride infusion  100 mL Intravenous PRN Jeri Cos, MD      . DISCONTD: feeding supplement (NEPRO CARB STEADY)  liquid 237 mL  237 mL Oral PRN Jeri Cos, MD      . DISCONTD: heparin injection 1,000 Units  1,000 Units Dialysis PRN Jeri Cos, MD      . DISCONTD: heparin injection 20 Units/kg  20 Units/kg Dialysis PRN Jeri Cos, MD      . DISCONTD: heparin injection 20 Units/kg  20 Units/kg Dialysis PRN Jeri Cos, MD   1,240 Units at 06/01/11 2025  . DISCONTD: lidocaine (XYLOCAINE) 1 % injection 5 mL  5 mL Intradermal PRN Jeri Cos, MD      . DISCONTD: lidocaine-prilocaine (EMLA) cream 1 application  1 application Topical PRN Jeri Cos, MD      . DISCONTD: pentafluoroprop-tetrafluoroeth (GEBAUERS) aerosol 1 application  1 application Topical PRN Jeri Cos, MD       Medications Prior to Admission  Medication Sig Dispense Refill  . allopurinol (ZYLOPRIM) 100 MG tablet Take 100 mg by mouth daily.        Marland Kitchen amLODipine (NORVASC) 5 MG tablet Take 5 mg by mouth at bedtime.       . calcium acetate (PHOSLO) 667 MG capsule Take 1,334 mg by mouth 3 (three) times daily with meals.       . Coenzyme Q10 150 MG CAPS Take 300 mg by mouth daily.       Marland Kitchen  colchicine 0.6 MG tablet Take 0.6 mg by mouth daily as needed. For gout.      . docusate sodium (COLACE) 100 MG capsule Take 200 mg by mouth at bedtime.        . hydrALAZINE (APRESOLINE) 25 MG tablet Take 50 mg by mouth 3 (three) times daily.       Marland Kitchen l-methylfolate-B6-B12 (METANX) 3-35-2 MG TABS Take 1 tablet by mouth 2 (two) times daily.       Marland Kitchen losartan (COZAAR) 50 MG tablet Take 50 mg by mouth at bedtime.       . metoprolol (TOPROL-XL) 50 MG 24 hr tablet Take 50 mg by mouth at bedtime.       . multivitamin (RENA-VIT) TABS tablet Take 1 tablet by mouth daily.        Marland Kitchen OVER THE COUNTER MEDICATION Take 60-90 mLs by mouth daily. OTC. QiNopal      . repaglinide (PRANDIN) 0.5 MG tablet Take 0.5 mg by mouth 3 (three) times daily before meals.          Results for orders placed during the hospital encounter of 06/03/11 (from the past 48  hour(s))  GLUCOSE, CAPILLARY     Status: Abnormal   Collection Time   06/03/11  9:28 PM      Component Value Range Comment   Glucose-Capillary 44 (*) 70 - 99 (mg/dL)   GLUCOSE, CAPILLARY     Status: Normal   Collection Time   06/03/11 10:05 PM      Component Value Range Comment   Glucose-Capillary 94  70 - 99 (mg/dL)    No results found.  Review of Systems  Constitutional: Negative.   HENT: Positive for congestion.   Eyes: Negative.   Respiratory: Negative.   Cardiovascular: Negative.   Gastrointestinal: Negative.   Genitourinary: Negative.   Musculoskeletal: Negative.   Skin: Negative.   Neurological: Negative.   Endo/Heme/Allergies: Negative.   Psychiatric/Behavioral: Negative.     Blood pressure 159/106, pulse 95, temperature 98.2 F (36.8 C), temperature source Oral, resp. rate 19, SpO2 99.00%. Physical Exam  Constitutional: She is oriented to person, place, and time. She appears well-developed and well-nourished. No distress.  HENT:  Head: Normocephalic.  Right Ear: External ear normal.  Left Ear: External ear normal.  Nose: Nose normal.  Mouth/Throat: Oropharynx is clear and moist.  Eyes: Conjunctivae are normal. Pupils are equal, round, and reactive to light. Right eye exhibits no discharge. Left eye exhibits no discharge. No scleral icterus.  Neck: Normal range of motion. Neck supple.  Cardiovascular: Normal rate and regular rhythm.   Respiratory: Effort normal and breath sounds normal. No respiratory distress. She has no wheezes. She has no rales.  GI: Soft. Bowel sounds are normal. She exhibits no distension. There is no tenderness. There is no rebound.  Musculoskeletal: Normal range of motion. She exhibits no edema and no tenderness.  Neurological: She is alert and oriented to person, place, and time.       Moves all extremities.  Skin: Skin is warm and dry. She is not diaphoretic.  Psychiatric: Her behavior is normal.     Assessment/Plan #1. ESRD on  hemodialysis - Dr. Bascom Levels has arranged for her dialysis which patient is about to get. #2. Diabetes mellitus2 - patient was found mildly hypoglycemic in the ER which got better after patient ate dinner. Closely was CBGs. #3. Possible developing sinusitis - I have advised the patient to follow with her PCP if her sinusitis does not get  better. #4. History of hypertension - continue present medications.  CODE STATUS - full code.  Kasiya Burck N. 06/03/2011, 11:59 PM

## 2011-06-03 NOTE — ED Notes (Signed)
PT. IS HERE FOR HER ROUTINE HEMODIALYSIS , STATES SHE PURPOSELY DID NOT COME THIS MORNING FOR DIALYSIS DUE TO LONG WAIT. NO OTHER COMPLAINTS.

## 2011-06-03 NOTE — ED Notes (Signed)
Spoke with [facility] in dialysis.  When [facility] sets orders she can go up.

## 2011-06-03 NOTE — ED Notes (Signed)
Pt is resting, no distress.  Pt refuses to move out of stretcher triage until she is transferred upstairs.

## 2011-06-04 ENCOUNTER — Inpatient Hospital Stay (HOSPITAL_COMMUNITY): Payer: Medicare HMO

## 2011-06-04 LAB — CBC
HCT: 32.6 % — ABNORMAL LOW (ref 36.0–46.0)
MCH: 28 pg (ref 26.0–34.0)
MCV: 85.3 fL (ref 78.0–100.0)
Platelets: 144 10*3/uL — ABNORMAL LOW (ref 150–400)
RDW: 19.2 % — ABNORMAL HIGH (ref 11.5–15.5)
WBC: 5.2 10*3/uL (ref 4.0–10.5)

## 2011-06-04 LAB — RENAL FUNCTION PANEL
CO2: 26 mEq/L (ref 19–32)
Calcium: 8.9 mg/dL (ref 8.4–10.5)
Creatinine, Ser: 8.19 mg/dL — ABNORMAL HIGH (ref 0.50–1.10)
Glucose, Bld: 68 mg/dL — ABNORMAL LOW (ref 70–99)
Phosphorus: 2.6 mg/dL (ref 2.3–4.6)

## 2011-06-04 LAB — DIFFERENTIAL
Basophils Absolute: 0 10*3/uL (ref 0.0–0.1)
Eosinophils Absolute: 0 10*3/uL (ref 0.0–0.7)
Eosinophils Relative: 0 % (ref 0–5)
Lymphocytes Relative: 29 % (ref 12–46)
Lymphs Abs: 1.5 10*3/uL (ref 0.7–4.0)
Monocytes Absolute: 1 10*3/uL (ref 0.1–1.0)

## 2011-06-04 LAB — GLUCOSE, CAPILLARY: Glucose-Capillary: 80 mg/dL (ref 70–99)

## 2011-06-04 MED ORDER — PARICALCITOL 5 MCG/ML IV SOLN
INTRAVENOUS | Status: AC
  Administered 2011-06-04: 1 ug via INTRAVENOUS
  Filled 2011-06-04: qty 1

## 2011-06-04 MED ORDER — DARBEPOETIN ALFA-POLYSORBATE 100 MCG/0.5ML IJ SOLN
100.0000 ug | Freq: Once | INTRAMUSCULAR | Status: AC
  Administered 2011-06-04: 100 ug via INTRAVENOUS
  Filled 2011-06-04: qty 0.5

## 2011-06-04 MED ORDER — PARICALCITOL 5 MCG/ML IV SOLN
1.0000 ug | Freq: Once | INTRAVENOUS | Status: AC
  Administered 2011-06-04: 1 ug via INTRAVENOUS
  Filled 2011-06-04: qty 0.2

## 2011-06-04 MED ORDER — GUAIFENESIN 100 MG/5ML PO SOLN
5.0000 mL | ORAL | Status: DC | PRN
  Administered 2011-06-04: 100 mg via ORAL
  Filled 2011-06-04: qty 5

## 2011-06-04 MED ORDER — DARBEPOETIN ALFA-POLYSORBATE 100 MCG/0.5ML IJ SOLN
INTRAMUSCULAR | Status: AC
  Administered 2011-06-04: 100 ug via INTRAVENOUS
  Filled 2011-06-04: qty 0.5

## 2011-06-04 MED ORDER — CYCLOBENZAPRINE HCL 5 MG PO TABS
5.0000 mg | ORAL_TABLET | Freq: Once | ORAL | Status: AC
  Administered 2011-06-04: 5 mg via ORAL
  Filled 2011-06-04: qty 1

## 2011-06-04 NOTE — Progress Notes (Signed)
Patient tolerated hemodialysis treatment well. Consistent conversation topic while awake is difficulty in establishing a consistent scheduled hemodialysis treatment regimen. RN recommended outpatient hemodialysis as the optimal solution to receiving consistent scheduled hemodialysis treatments. Patient continues to speak about the events that led to her being released from her outpatient treatment clinic. RN encouraged patient to work towards an optimal solution to meet her healthcare needs.

## 2011-06-04 NOTE — Discharge Summary (Signed)
Discharge Summary - I did not see or evaluate the patient.  Alexandra [name] MR#: [id number]  DOB:[birth year]  Date of Admission: 06/03/2011 Date of Discharge: 06/04/2011  Patient's PCP: [name], MD, MD  Attending Physician:Caryle Helgeson A  Consults: Dr. Bascom Levels  Discharge Diagnoses: Principal Problem:  *End stage renal disease on dialysis Active Problems:  HTN (hypertension)  DM (diabetes mellitus)  Anemia of chronic disease  Gout  Brief Admitting History and Physical 66 year old female with history of end-stage renal disease on hemodialysis, type 2 diabetes, hyperlipidemia, hypertension who presented on March 21 at 2013 for dialysis.  Discharge Medications Not reconciled as the patient left before my examination.    Hospital Course: #1. ESRD on hemodialysis - Dr. Bascom Levels dialyze the patient.  #2. Diabetes mellitus 2, stable.  Did not have opportunity to discuss patient's regimen. #3. Possible developing sinusitis, patient left prior to my examination  #4. History of hypertension, patient left prior to my examination.  Day of Discharge BP 168/64  Pulse 95  Temp(Src) 98.4 F (36.9 C) (Oral)  Resp 16  SpO2 91%  Results for orders placed during the hospital encounter of 06/03/11 (from the past 48 hour(s))  GLUCOSE, CAPILLARY     Status: Abnormal   Collection Time   06/03/11  9:28 PM      Component Value Range Comment   Glucose-Capillary 44 (*) 70 - 99 (mg/dL)   GLUCOSE, CAPILLARY     Status: Normal   Collection Time   06/03/11 10:05 PM      Component Value Range Comment   Glucose-Capillary 94  70 - 99 (mg/dL)   CBC     Status: Abnormal   Collection Time   06/04/11 12:32 AM      Component Value Range Comment   WBC 5.2  4.0 - 10.5 (K/uL)    RBC 3.82 (*) 3.87 - 5.11 (MIL/uL)    Hemoglobin 10.7 (*) 12.0 - 15.0 (g/dL)    HCT 52.8 (*) 41.3 - 46.0 (%)    MCV 85.3  78.0 - 100.0 (fL)    MCH 28.0  26.0 - 34.0 (pg)    MCHC 32.8  30.0 - 36.0 (g/dL)    RDW  24.4 (*) 01.0 - 15.5 (%)    Platelets 144 (*) 150 - 400 (K/uL)   DIFFERENTIAL     Status: Abnormal   Collection Time   06/04/11 12:32 AM      Component Value Range Comment   Neutrophils Relative 52  43 - 77 (%)    Neutro Abs 2.7  1.7 - 7.7 (K/uL)    Lymphocytes Relative 29  12 - 46 (%)    Lymphs Abs 1.5  0.7 - 4.0 (K/uL)    Monocytes Relative 19 (*) 3 - 12 (%)    Monocytes Absolute 1.0  0.1 - 1.0 (K/uL)    Eosinophils Relative 0  0 - 5 (%)    Eosinophils Absolute 0.0  0.0 - 0.7 (K/uL)    Basophils Relative 0  0 - 1 (%)    Basophils Absolute 0.0  0.0 - 0.1 (K/uL)   RENAL FUNCTION PANEL     Status: Abnormal   Collection Time   06/04/11 12:32 AM      Component Value Range Comment   Sodium 137  135 - 145 (mEq/L)    Potassium 3.7  3.5 - 5.1 (mEq/L)    Chloride 95 (*) 96 - 112 (mEq/L)    CO2 26  19 - 32 (mEq/L)  Glucose, Bld 68 (*) 70 - 99 (mg/dL)    BUN 42 (*) 6 - 23 (mg/dL)    Creatinine, Ser 9.56 (*) 0.50 - 1.10 (mg/dL)    Calcium 8.9  8.4 - 10.5 (mg/dL)    Phosphorus 2.6  2.3 - 4.6 (mg/dL)    Albumin 3.1 (*) 3.5 - 5.2 (g/dL)    GFR calc non Af Amer 4 (*) >90 (mL/min)    GFR calc Af Amer 5 (*) >90 (mL/min)   GLUCOSE, CAPILLARY     Status: Normal   Collection Time   06/04/11 12:38 AM      Component Value Range Comment   Glucose-Capillary 89  70 - 99 (mg/dL)   GLUCOSE, CAPILLARY     Status: Normal   Collection Time   06/04/11  5:05 AM      Component Value Range Comment   Glucose-Capillary 80  70 - 99 (mg/dL)     No results found.   Disposition: Patient left prior to my examination.    Diet: Left prior to my examination.  Activity: Left prior to my examination  Signed: Ashtin Rosner A, MD 06/04/2011, 11:18 AM

## 2011-06-05 ENCOUNTER — Other Ambulatory Visit (HOSPITAL_COMMUNITY): Payer: Self-pay | Admitting: Hematology

## 2011-06-05 ENCOUNTER — Inpatient Hospital Stay (HOSPITAL_COMMUNITY)
Admission: EM | Admit: 2011-06-05 | Discharge: 2011-06-06 | DRG: 685 | Disposition: A | Discharge status: Home / Self Care | Payer: Medicare HMO | Attending: Family Medicine | Admitting: Family Medicine

## 2011-06-05 ENCOUNTER — Other Ambulatory Visit: Payer: Self-pay | Admitting: Nephrology

## 2011-06-05 ENCOUNTER — Emergency Department (HOSPITAL_COMMUNITY): Payer: Medicare HMO

## 2011-06-05 DIAGNOSIS — N2581 Secondary hyperparathyroidism of renal origin: Secondary | ICD-10-CM | POA: Diagnosis present

## 2011-06-05 DIAGNOSIS — IMO0002 Reserved for concepts with insufficient information to code with codable children: Secondary | ICD-10-CM | POA: Diagnosis present

## 2011-06-05 DIAGNOSIS — F17200 Nicotine dependence, unspecified, uncomplicated: Secondary | ICD-10-CM | POA: Diagnosis present

## 2011-06-05 DIAGNOSIS — E119 Type 2 diabetes mellitus without complications: Secondary | ICD-10-CM | POA: Diagnosis present

## 2011-06-05 DIAGNOSIS — M109 Gout, unspecified: Secondary | ICD-10-CM | POA: Diagnosis present

## 2011-06-05 DIAGNOSIS — I120 Hypertensive chronic kidney disease with stage 5 chronic kidney disease or end stage renal disease: Secondary | ICD-10-CM | POA: Diagnosis present

## 2011-06-05 DIAGNOSIS — N186 End stage renal disease: Secondary | ICD-10-CM | POA: Diagnosis present

## 2011-06-05 DIAGNOSIS — Z992 Dependence on renal dialysis: Principal | ICD-10-CM

## 2011-06-05 DIAGNOSIS — D638 Anemia in other chronic diseases classified elsewhere: Secondary | ICD-10-CM | POA: Diagnosis present

## 2011-06-05 DIAGNOSIS — X58XXXA Exposure to other specified factors, initial encounter: Secondary | ICD-10-CM | POA: Diagnosis present

## 2011-06-05 DIAGNOSIS — E785 Hyperlipidemia, unspecified: Secondary | ICD-10-CM | POA: Diagnosis present

## 2011-06-05 LAB — DIFFERENTIAL
Basophils Relative: 1 % (ref 0–1)
Lymphocytes Relative: 34 % (ref 12–46)
Monocytes Absolute: 0.8 10*3/uL (ref 0.1–1.0)
Monocytes Relative: 15 % — ABNORMAL HIGH (ref 3–12)
Neutro Abs: 2.9 10*3/uL (ref 1.7–7.7)

## 2011-06-05 LAB — RENAL FUNCTION PANEL
BUN: 34 mg/dL — ABNORMAL HIGH (ref 6–23)
CO2: 28 mEq/L (ref 19–32)
Chloride: 97 mEq/L (ref 96–112)
Creatinine, Ser: 7.01 mg/dL — ABNORMAL HIGH (ref 0.50–1.10)

## 2011-06-05 LAB — CBC
HCT: 32.8 % — ABNORMAL LOW (ref 36.0–46.0)
Hemoglobin: 10.8 g/dL — ABNORMAL LOW (ref 12.0–15.0)
MCHC: 32.9 g/dL (ref 30.0–36.0)
MCV: 86.1 fL (ref 78.0–100.0)

## 2011-06-05 MED ORDER — HEPARIN SODIUM (PORCINE) 1000 UNIT/ML DIALYSIS
20.0000 [IU]/kg | INTRAMUSCULAR | Status: DC | PRN
  Filled 2011-06-05: qty 2

## 2011-06-05 MED ORDER — NEPRO/CARBSTEADY PO LIQD: 237.0000 mL | ORAL | Status: DC | PRN

## 2011-06-05 MED ORDER — SODIUM CHLORIDE 0.9 % IV SOLN: 100.0000 mL | INTRAVENOUS | Status: DC | PRN

## 2011-06-05 MED ORDER — HEPARIN SODIUM (PORCINE) 1000 UNIT/ML DIALYSIS: 1000.0000 [IU] | INTRAMUSCULAR | Status: DC | PRN

## 2011-06-05 MED ORDER — PARICALCITOL 5 MCG/ML IV SOLN
1.0000 ug | Freq: Once | INTRAVENOUS | Status: AC
  Administered 2011-06-06: 1 ug via INTRAVENOUS

## 2011-06-05 MED ORDER — ALTEPLASE 2 MG IJ SOLR: 2.0000 mg | Freq: Once | INTRAMUSCULAR | Status: AC | PRN

## 2011-06-05 NOTE — ED Notes (Signed)
Pt states that she she gets dialysis on Tuesday thursdays and saturdays, and here for Saturday dialysis

## 2011-06-05 NOTE — ED Notes (Signed)
Reita Cliche, charge RN notified that patient is in department (admitting MD on dialysis floor to write orders for patient for direct admit; charge RN aware and states that he will call up to dialysis floor to find out more information).

## 2011-06-05 NOTE — ED Provider Notes (Signed)
History     CSN: [id number]  Arrival date & time 06/05/11  2109   First MD Initiated Contact with Patient 06/05/11 2215      Chief Complaint  Patient presents with  . dialysis     (Consider location/radiation/quality/duration/timing/severity/associated sxs/prior treatment) HPI This 66 year old female is here for dialysis. She is unable to have dialysis at any local outpatient dialysis centers. She has no complaints. She has no fever chest pain shortness breath abdominal pain vomiting or other concerns. She has no edema other than her trace foot and ankle edema which is baseline for her. Past Medical History  Diagnosis Date  . Chronic kidney disease     esrd  . Diabetes mellitus   . Hyperlipidemia   . Hypertension   . Renal disorder   . Dialysis care     tues, thurs, sat  . 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 Carotid endarterectomy 2002    left  . Btl   . Rectal fissurectomy   . Av fistula placement 10/30/2010    right brachiocephalic   . Fistulogram     Family History  Problem Relation Age of Onset  . COPD Mother   . Kidney disease Mother   . Heart disease Father   . Lymphoma Son     History  Substance Use Topics  . Smoking status: Current Everyday Smoker -- 1.0 packs/day for 50 years    Types: Cigarettes  . Smokeless tobacco: Never Used  . Alcohol Use: No    OB History    Grav Para Term Preterm Abortions TAB SAB Ect Mult Living                  Review of Systems  Constitutional: Negative for fever.       10 Systems reviewed and are negative for acute change except as noted in the HPI.  HENT: Negative for congestion.   Eyes: Negative for discharge and redness.  Respiratory: Negative for cough and shortness of breath.   Cardiovascular: Negative for chest pain.  Gastrointestinal: Negative for vomiting and abdominal pain.  Musculoskeletal: Negative for back pain.  Skin: Negative for rash.  Neurological:  Negative for syncope, numbness and headaches.  Psychiatric/Behavioral:       No behavior change.    Allergies  Codeine; Epinephrine; and Percocet  Home Medications   Current Outpatient Rx  Name Route Sig Dispense Refill  . ALLOPURINOL 100 MG PO TABS Oral Take 100 mg by mouth daily.      Marland Kitchen AMLODIPINE BESYLATE 5 MG PO TABS Oral Take 5 mg by mouth at bedtime.     Marland Kitchen CALCIUM ACETATE 667 MG PO CAPS Oral Take 1,334 mg by mouth 3 (three) times daily with meals.     Marland Kitchen COENZYME Q10 150 MG PO CAPS Oral Take 300 mg by mouth daily.     . COLCHICINE 0.6 MG PO TABS Oral Take 0.6 mg by mouth daily as needed. For gout.    . CYCLOBENZAPRINE HCL 10 MG PO TABS Oral Take 10 mg by mouth 3 (three) times daily as needed. For muscle spasms    . DOCUSATE SODIUM 100 MG PO CAPS Oral Take 200 mg by mouth at bedtime.      Marland Kitchen HYDRALAZINE HCL 25 MG PO TABS Oral Take 50 mg by mouth 3 (three) times daily.     . L-METHYLFOLATE-B6-B12 3-35-2 MG PO TABS Oral Take 1 tablet by mouth  2 (two) times daily.     Marland Kitchen LOSARTAN POTASSIUM 50 MG PO TABS Oral Take 50 mg by mouth at bedtime.     Marland Kitchen METOPROLOL SUCCINATE ER 50 MG PO TB24 Oral Take 50 mg by mouth at bedtime.     Marland Kitchen RENA-VITE PO TABS Oral Take 1 tablet by mouth daily.      Marland Kitchen REPAGLINIDE 0.5 MG PO TABS Oral Take 0.5 mg by mouth 3 (three) times daily before meals.      Marland Kitchen OVER THE COUNTER MEDICATION Oral Take 60-90 mLs by mouth daily. OTC. QiNopal      BP 175/69  Pulse 93  Temp(Src) 98.1 F (36.7 C) (Oral)  Resp 20  Wt 130 lb 10 oz (59.25 kg)  SpO2 91%  Physical Exam  Nursing note and vitals reviewed. Constitutional:       Awake, alert, nontoxic appearance.  HENT:  Head: Atraumatic.  Eyes: Right eye exhibits no discharge. Left eye exhibits no discharge.  Neck: Neck supple.  Cardiovascular: Normal rate and regular rhythm.   No murmur heard. Pulmonary/Chest: Effort normal and breath sounds normal. No respiratory distress. She has no wheezes. She has no rales. She  exhibits no tenderness.  Abdominal: Soft. There is no tenderness. There is no rebound.  Musculoskeletal: She exhibits no tenderness.       Baseline ROM, no obvious new focal weakness. Trace edema of the feet and ankles  Neurological:       Mental status and motor strength appears baseline for patient and situation.  Skin: No rash noted.  Psychiatric: She has a normal mood and affect.    ED Course  Procedures (including critical care time)  Labs Reviewed  CBC - Abnormal; Notable for the following:    RBC 3.81 (*)    Hemoglobin 10.8 (*)    HCT 32.8 (*)    RDW 19.2 (*)    Platelets 148 (*)    All other components within normal limits  DIFFERENTIAL - Abnormal; Notable for the following:    Monocytes Relative 15 (*)    All other components within normal limits  RENAL FUNCTION PANEL - Abnormal; Notable for the following:    Glucose, Bld 100 (*)    BUN 34 (*)    Creatinine, Ser 7.01 (*)    Albumin 3.2 (*)    GFR calc non Af Amer 5 (*)    GFR calc Af Amer 6 (*)    All other components within normal limits  LAB REPORT - SCANNED   No results found.   1. End stage renal disease on dialysis       MDM  Patient / Family / Caregiver understand and agree with initial ED impression and plan with expectations set for ED visit.  I doubt any other EMC precluding discharge at this time including, but not necessarily limited to the following:HF, ACS, SBI.        Hurman Horn, MD 06/07/11 [phone]

## 2011-06-06 ENCOUNTER — Encounter (HOSPITAL_COMMUNITY): Payer: Self-pay | Admitting: Internal Medicine

## 2011-06-06 MED ORDER — SODIUM CHLORIDE 0.9 % IJ SOLN: 3.0000 mL | INTRAMUSCULAR | Status: DC | PRN

## 2011-06-06 MED ORDER — PARICALCITOL 5 MCG/ML IV SOLN
INTRAVENOUS | Status: AC
  Administered 2011-06-06: 1 ug via INTRAVENOUS
  Filled 2011-06-06: qty 1

## 2011-06-06 MED ORDER — CYCLOBENZAPRINE HCL 5 MG PO TABS
5.0000 mg | ORAL_TABLET | Freq: Once | ORAL | Status: AC
  Administered 2011-06-06: 5 mg via ORAL
  Filled 2011-06-06 (×2): qty 1

## 2011-06-06 MED ORDER — DARBEPOETIN ALFA-POLYSORBATE 100 MCG/0.5ML IJ SOLN: 100.0000 ug | Freq: Once | INTRAMUSCULAR | Status: DC

## 2011-06-06 MED ORDER — SODIUM CHLORIDE 0.9 % IV SOLN: 250.0000 mL | INTRAVENOUS | Status: DC | PRN

## 2011-06-06 MED ORDER — SODIUM CHLORIDE 0.9 % IJ SOLN: 3.0000 mL | Freq: Two times a day (BID) | INTRAMUSCULAR | Status: DC

## 2011-06-06 MED ORDER — INSULIN ASPART 100 UNIT/ML ~~LOC~~ SOLN: 0.0000 [IU] | Freq: Three times a day (TID) | SUBCUTANEOUS | Status: DC

## 2011-06-06 MED ORDER — DARBEPOETIN ALFA-POLYSORBATE 100 MCG/0.5ML IJ SOLN
INTRAMUSCULAR | Status: AC
  Filled 2011-06-06: qty 0.5

## 2011-06-06 MED ORDER — GUAIFENESIN 100 MG/5ML PO SOLN
5.0000 mL | ORAL | Status: DC | PRN
  Administered 2011-06-06: 100 mg via ORAL
  Filled 2011-06-06: qty 5

## 2011-06-06 NOTE — Progress Notes (Signed)
Pt d/c home from unit per order.  Pt was much more cordial to staff this tx and followed unit procedures and policies.  Pt did not talk on cell phone and have speaker phone on so that other party could hear conversations in our unit.  The only thing that pt refused was she refused to let me put stop heparin sticker around her cath port caps and she never lets me do that and she would not let us lie her flat down to pull her up in the stretcher which put staff at risk for hurting their backs, that was explained to her that in order to protect our backs and use proper lifting techniques, we needed to have her in a good position in the bed before lifting her up.  She refused.

## 2011-06-06 NOTE — H&P (Signed)
DATE OF ADMISSION:  06/06/2011  PCP:    [name], MD, MD   Chief Complaint: Dialysis Needed   HPI: Alexandra [name] is an 66 y.o. female with ESRD on HD who presents for her routine Dialysis Treatment.  She receives Dialysis on Tuesdays Thursdays and Saturdays.  She only has complaints at this time of pain in her Right thigh after exercising. She has had this discomfort X 1 day.  She states that she is being evaluated at [facility] this upcoming week for a Kidney transplant.      Past Medical History  Diagnosis Date  . Chronic kidney disease     esrd  . Diabetes mellitus   . Hyperlipidemia   . Hypertension   . Renal disorder   . Dialysis care     tues, thurs, sat  . 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 Carotid endarterectomy 2002    left  . Btl   . Rectal fissurectomy   . Av fistula placement 10/30/2010    right brachiocephalic   . Fistulogram     Medications:  HOME MEDS: Prior to Admission medications   Medication Sig Start Date End Date Taking? Authorizing Provider  allopurinol (ZYLOPRIM) 100 MG tablet Take 100 mg by mouth daily.     Yes Historical Provider, MD  amLODipine (NORVASC) 5 MG tablet Take 5 mg by mouth at bedtime.    Yes Historical Provider, MD  calcium acetate (PHOSLO) 667 MG capsule Take 1,334 mg by mouth 3 (three) times daily with meals.    Yes Historical Provider, MD  Coenzyme Q10 150 MG CAPS Take 300 mg by mouth daily.    Yes Historical Provider, MD  colchicine 0.6 MG tablet Take 0.6 mg by mouth daily as needed. For gout.   Yes Historical Provider, MD  cyclobenzaprine (FLEXERIL) 10 MG tablet Take 10 mg by mouth 3 (three) times daily as needed. For muscle spasms   Yes Historical Provider, MD  docusate sodium (COLACE) 100 MG capsule Take 200 mg by mouth at bedtime.     Yes Historical Provider, MD  hydrALAZINE (APRESOLINE) 25 MG tablet Take 50 mg by mouth 3 (three) times daily.    Yes Historical  Provider, MD  l-methylfolate-B6-B12 (METANX) 3-35-2 MG TABS Take 1 tablet by mouth 2 (two) times daily.    Yes Historical Provider, MD  losartan (COZAAR) 50 MG tablet Take 50 mg by mouth at bedtime.    Yes Historical Provider, MD  metoprolol (TOPROL-XL) 50 MG 24 hr tablet Take 50 mg by mouth at bedtime.    Yes Historical Provider, MD  multivitamin (RENA-VIT) TABS tablet Take 1 tablet by mouth daily.     Yes Historical Provider, MD  repaglinide (PRANDIN) 0.5 MG tablet Take 0.5 mg by mouth 3 (three) times daily before meals.     Yes Historical Provider, MD  OVER THE COUNTER MEDICATION Take 60-90 mLs by mouth daily. OTC. QiNopal    Historical Provider, MD    Allergies:  Allergies  Allergen Reactions  . Codeine Other (See Comments)    Passes out  . Epinephrine Other (See Comments)    Tachycardia, diaphoresis, syncope  . Percocet (Oxycodone-Acetaminophen) Other (See Comments)    Passes  out    Social History:   reports that she has been smoking Cigarettes.  She has a 50 pack-year smoking history. She has never used smokeless tobacco. She reports that she does not drink alcohol  or use illicit drugs.  Family History: Family History  Problem Relation Age of Onset  . COPD Mother   . Kidney disease Mother   . Heart disease Father   . Lymphoma Son     Review of Systems:  The patient denies anorexia, fever, weight loss, vision loss, decreased hearing, hoarseness, chest pain, syncope, dyspnea on exertion, peripheral edema, balance deficits, hemoptysis, abdominal pain, melena, hematochezia, severe indigestion/heartburn, hematuria, incontinence, genital sores, muscle weakness, suspicious skin lesions, transient blindness, difficulty walking, depression, unusual weight change, abnormal bleeding, enlarged lymph nodes, angioedema, and breast masses.   Physical Exam:  GEN:  Pleasant  examined  and in no acute distress; cooperative with exam Filed Vitals:   06/05/11 2330 06/06/11 0000 06/06/11  0030 06/06/11 0100  BP: 176/73 152/63 155/57 165/67  Pulse: 81 81 92 95  Temp:      TempSrc:      Resp: 20 18 20 22   Weight:      SpO2:       Blood pressure 165/67, pulse 95, temperature 98.2 F (36.8 C), temperature source Oral, resp. rate 22, weight 59.55 kg (131 lb 4.5 oz), SpO2 94.00%. PSYCH: She is alert and oriented x4; does not appear anxious does not appear depressed; affect is normal HEENT: Normocephalic and Atraumatic, Mucous membranes pink; PERRLA; EOM intact; Fundi:  Benign;  No scleral icterus, Nares: Patent, Oropharynx: Clear, Fair Dentition, Neck:  FROM, no cervical lymphadenopathy nor thyromegaly or carotid bruit; no JVD; Breasts:: Not examined CHEST WALL: No tenderness CHEST: Normal respiration, clear to auscultation bilaterally HEART: Regular rate and rhythm; no murmurs rubs or gallops BACK: No kyphosis or scoliosis; no CVA tenderness ABDOMEN: Positive Bowel Sounds, Scaphoid, Soft non-tender; no masses, no organomegaly.   Rectal Exam: Not done EXTREMITIES: No cyanosis, clubbing or edema; no ulcerations. Genitalia: not examined PULSES: 2+ and symmetric SKIN: Normal hydration no rash or ulceration CNS: Cranial nerves 2-12 grossly intact no focal neurologic deficit   Labs & Imaging Results for orders placed during the hospital encounter of 06/05/11 (from the past 48 hour(s))  CBC     Status: Abnormal   Collection Time   06/05/11 11:08 PM      Component Value Range Comment   WBC 5.7  4.0 - 10.5 (K/uL)    RBC 3.81 (*) 3.87 - 5.11 (MIL/uL)    Hemoglobin 10.8 (*) 12.0 - 15.0 (g/dL)    HCT 96.0 (*) 45.4 - 46.0 (%)    MCV 86.1  78.0 - 100.0 (fL)    MCH 28.3  26.0 - 34.0 (pg)    MCHC 32.9  30.0 - 36.0 (g/dL)    RDW 09.8 (*) 11.9 - 15.5 (%)    Platelets 148 (*) 150 - 400 (K/uL)   DIFFERENTIAL     Status: Abnormal   Collection Time   06/05/11 11:08 PM      Component Value Range Comment   Neutrophils Relative 51  43 - 77 (%)    Neutro Abs 2.9  1.7 - 7.7 (K/uL)     Lymphocytes Relative 34  12 - 46 (%)    Lymphs Abs 1.9  0.7 - 4.0 (K/uL)    Monocytes Relative 15 (*) 3 - 12 (%)    Monocytes Absolute 0.8  0.1 - 1.0 (K/uL)    Eosinophils Relative 1  0 - 5 (%)    Eosinophils Absolute 0.0  0.0 - 0.7 (K/uL)    Basophils Relative 1  0 - 1 (%)    Basophils  Absolute 0.0  0.0 - 0.1 (K/uL)   RENAL FUNCTION PANEL     Status: Abnormal   Collection Time   06/05/11 11:08 PM      Component Value Range Comment   Sodium 136  135 - 145 (mEq/L)    Potassium 3.8  3.5 - 5.1 (mEq/L)    Chloride 97  96 - 112 (mEq/L)    CO2 28  19 - 32 (mEq/L)    Glucose, Bld 100 (*) 70 - 99 (mg/dL)    BUN 34 (*) 6 - 23 (mg/dL)    Creatinine, Ser 0.98 (*) 0.50 - 1.10 (mg/dL)    Calcium 9.2  8.4 - 10.5 (mg/dL)    Phosphorus 2.3  2.3 - 4.6 (mg/dL)    Albumin 3.2 (*) 3.5 - 5.2 (g/dL)    GFR calc non Af Amer 5 (*) >90 (mL/min)    GFR calc Af Amer 6 (*) >90 (mL/min)    No results found.    Assessment: Present on Admission:  .HTN (hypertension) .End stage renal disease on dialysis .DM (diabetes mellitus) .Hyperparathyroidism, secondary renal .Hyperlipidemia .Anemia of chronic disease  Right Quadriceps Muscle Strain    Plan:    Admit for Dialysis Continue Regular meds Flexeril 5 mg PO X 1 SSI Coverage PRN Elevated Blood Sugars Other plans as per orders.    CODE STATUS:      FULL CODE       Malvern Kadlec C 06/06/2011, 1:38 AM

## 2011-06-06 NOTE — Discharge Summary (Signed)
Agree with Ms. Elray Mcgregor, NP's note.   Alexandra [name] 8:17 AM

## 2011-06-06 NOTE — Progress Notes (Cosign Needed)
[facility] hospitalist progress note Chief complaint. Dialysis. History of present illness. This 66 year old female with end-stage renal disease I routinely comes to [facility] for dialysis on Tuesday, Thursday, Saturday. She normally leaves following dialysis and routinely refuses to stay for further observation post dialysis. The same is true today, the patient has finished dialysis and she requests to be discharged. Vital signs temperature 98.2, pulse 93, respiration 20, blood pressure 171/76. General appearance. Well-developed pleasant elderly female in no distress. Cardiac. Rate and rhythm regular. Lungs. Clear and equal. Abdomen. Soft with positive bowel sounds. Impression/plan. Problem #1. End-stage renal disease. Patient successfully dialyzed and stable her clinical exam and vital signs. I did offer the option of staying for 24-hour observation. The patient declines this and states she prefers to go home. The patient is discharged pending her next dialysis session.

## 2011-06-07 NOTE — ED Provider Notes (Signed)
Medical screening examination/treatment/procedure(s) were performed by non-physician practitioner and as supervising physician I was immediately available for consultation/collaboration.   Gwyneth Sprout, MD 06/07/11 1336

## 2011-06-08 ENCOUNTER — Observation Stay (HOSPITAL_COMMUNITY): Payer: Medicare HMO

## 2011-06-08 ENCOUNTER — Observation Stay (HOSPITAL_COMMUNITY)
Admission: EM | Admit: 2011-06-08 | Discharge: 2011-06-08 | Discharge status: Against Medical Advice | Payer: Medicare HMO | Attending: Internal Medicine | Admitting: Internal Medicine

## 2011-06-08 ENCOUNTER — Encounter (HOSPITAL_COMMUNITY): Payer: Self-pay | Admitting: *Deleted

## 2011-06-08 DIAGNOSIS — I120 Hypertensive chronic kidney disease with stage 5 chronic kidney disease or end stage renal disease: Secondary | ICD-10-CM | POA: Insufficient documentation

## 2011-06-08 DIAGNOSIS — N186 End stage renal disease: Secondary | ICD-10-CM

## 2011-06-08 DIAGNOSIS — E785 Hyperlipidemia, unspecified: Secondary | ICD-10-CM | POA: Insufficient documentation

## 2011-06-08 DIAGNOSIS — Z992 Dependence on renal dialysis: Principal | ICD-10-CM | POA: Insufficient documentation

## 2011-06-08 DIAGNOSIS — E119 Type 2 diabetes mellitus without complications: Secondary | ICD-10-CM | POA: Insufficient documentation

## 2011-06-08 DIAGNOSIS — M109 Gout, unspecified: Secondary | ICD-10-CM | POA: Insufficient documentation

## 2011-06-08 LAB — CBC
Hemoglobin: 11 g/dL — ABNORMAL LOW (ref 12.0–15.0)
MCH: 28 pg (ref 26.0–34.0)
MCHC: 32 g/dL (ref 30.0–36.0)
MCV: 87.5 fL (ref 78.0–100.0)
RBC: 3.93 MIL/uL (ref 3.87–5.11)

## 2011-06-08 LAB — RENAL FUNCTION PANEL
BUN: 48 mg/dL — ABNORMAL HIGH (ref 6–23)
CO2: 30 mEq/L (ref 19–32)
Calcium: 9.5 mg/dL (ref 8.4–10.5)
Chloride: 96 mEq/L (ref 96–112)
Creatinine, Ser: 7.92 mg/dL — ABNORMAL HIGH (ref 0.50–1.10)
GFR calc non Af Amer: 5 mL/min — ABNORMAL LOW (ref >90)

## 2011-06-08 LAB — ABO/RH: ABO/RH(D): A NEG

## 2011-06-08 LAB — GLUCOSE, CAPILLARY: Glucose-Capillary: 158 mg/dL — ABNORMAL HIGH (ref 70–99)

## 2011-06-08 MED ORDER — PARICALCITOL 5 MCG/ML IV SOLN
1.0000 ug | Freq: Once | INTRAVENOUS | Status: AC
  Administered 2011-06-08: 1 ug via INTRAVENOUS

## 2011-06-08 MED ORDER — HEPARIN SODIUM (PORCINE) 1000 UNIT/ML DIALYSIS: 20.0000 [IU]/kg | INTRAMUSCULAR | Status: DC | PRN

## 2011-06-08 MED ORDER — INSULIN ASPART 100 UNIT/ML ~~LOC~~ SOLN: 0.0000 [IU] | Freq: Three times a day (TID) | SUBCUTANEOUS | Status: DC

## 2011-06-08 MED ORDER — GUAIFENESIN 100 MG/5ML PO SOLN
5.0000 mL | ORAL | Status: DC | PRN
  Administered 2011-06-08: 100 mg via ORAL
  Filled 2011-06-08: qty 5

## 2011-06-08 MED ORDER — PARICALCITOL 5 MCG/ML IV SOLN
INTRAVENOUS | Status: AC
  Administered 2011-06-08: 1 ug via INTRAVENOUS
  Filled 2011-06-08: qty 1

## 2011-06-08 NOTE — ED Notes (Signed)
Patient is here for her dialysis treatment.  Last treatment was saturday

## 2011-06-08 NOTE — Consult Note (Signed)
Pt. In ER  Pt. To get dialysis.

## 2011-06-08 NOTE — ED Provider Notes (Signed)
I saw and evaluated the patient, reviewed the resident's note and I agree with the findings and plan.  Pt is here for her routine dialysis treatment.  She is no longer able to receive treatment at outpatient dialysis centers.  Pt last had a treatment on Saturday.  Pt is comfortable in no distress.  Will contact Dr Bascom Levels and the hospitalist service.  Celene Kras, MD 06/08/11 [phone]

## 2011-06-08 NOTE — Discharge Summary (Signed)
Physician Discharge Summary  Patient ID: Alexandra [name] MRN: [id number] DOB/AGE: [birth year] 66 y.o.  Admit date: 06/08/2011 Discharge date: 06/08/2011  Admission Diagnoses: ESRD on HD  Discharge Diagnoses:  1. ESRD on HD 2. DM 3. HTN 4. Gout 5. Dyslipidemia 6. ACD   Discharged Condition: good, stable  Hospital Course: Brief:  Pt admitted by [facility] for her one of three times per week HD performed here at [facility]. She doesn't receive HD at any outpt facility and comes in through the ED here for HD on Tues, Thurs, and Saturdays. Dr. Bascom Levels is nephrologist and has consulted about HD and all orders for HD which occurred during this event. She normally signs out AMA if we are not present at the exact moment her HD ends.    Consults: nephrology  Significant Diagnostic Studies:labs reviewed Treatments: HD Discharge Exam: Blood pressure 134/55, pulse 87, temperature 97.3 F (36.3 C), temperature source Oral, resp. rate 17, weight 61.1 kg (134 lb 11.2 oz), SpO2 99.00%. GEN: sitting up on stretcher in HD receiving last few minutes of HD. Appears very well. NAD. CARD: RRR LUNGS: normal effort, CTA ABD: soft MSK: MOE x4. NEURO: grossly intact PSYCH: alert and oriented x 3, pleasant, cooperative  Disposition:  Home and self care. Pt states she lives alone.  Diet: Resume pre HD diet. Activity: As before and as tolerated   Medication List  As of 06/08/2011 11:01 PM   TAKE these medications         allopurinol 100 MG tablet   Commonly known as: ZYLOPRIM   Take 100 mg by mouth daily.      amLODipine 5 MG tablet   Commonly known as: NORVASC   Take 5 mg by mouth at bedtime.      calcium acetate 667 MG capsule   Commonly known as: PHOSLO   Take 1,334 mg by mouth 3 (three) times daily with meals.      Coenzyme Q10 150 MG Caps   Take 300 mg by mouth daily.      colchicine 0.6 MG tablet   Take 0.6 mg by mouth daily as needed. For gout.      cyclobenzaprine 10 MG tablet   Commonly known as: FLEXERIL   Take 10 mg by mouth 3 (three) times daily as needed. For muscle spasms      docusate sodium 100 MG capsule   Commonly known as: COLACE   Take 200 mg by mouth at bedtime.      hydrALAZINE 25 MG tablet   Commonly known as: APRESOLINE   Take 50 mg by mouth 3 (three) times daily.      l-methylfolate-B6-B12 3-35-2 MG Tabs   Commonly known as: METANX   Take 1 tablet by mouth 2 (two) times daily.      losartan 50 MG tablet   Commonly known as: COZAAR   Take 50 mg by mouth at bedtime.      metoprolol succinate 50 MG 24 hr tablet   Commonly known as: TOPROL-XL   Take 50 mg by mouth at bedtime.      multivitamin Tabs tablet   Take 1 tablet by mouth daily.      OVER THE COUNTER MEDICATION   Take 60-90 mLs by mouth daily. OTC. QiNopal      repaglinide 0.5 MG tablet   Commonly known as: PRANDIN   Take 0.5 mg by mouth 3 (three) times daily before meals.           F/UP:  with PCP and on HD days Pt voices she feels well and is ready for d/c.    SignedMaren Reamer 06/08/2011, 11:01 PM [facility]

## 2011-06-08 NOTE — ED Notes (Signed)
Dialysis aware of patient arrival,  Meal tray has been ordered.  Secretary attempting to call Dr Audrie Lia

## 2011-06-08 NOTE — ED Notes (Signed)
Spoke with HD awaiting arrival of Dr. Bascom Levels prior to pt receiving dialysis

## 2011-06-08 NOTE — Progress Notes (Signed)
Pt became choked while drinking tea, required abdominal thrust to help clear her airway. States she has not had a problem swallowing. Pt informed to report if this occurs again, Breath sounds clear after episode.

## 2011-06-08 NOTE — ED Notes (Signed)
Dr. Bascom Levels and [facility] contacted about this pt.

## 2011-06-08 NOTE — ED Notes (Signed)
Patient refusing insulin. CBG 158

## 2011-06-08 NOTE — ED Provider Notes (Signed)
History     CSN: [id number]  Arrival date & time 06/08/11  1559   First MD Initiated Contact with Patient 06/08/11 1710      Chief Complaint  Patient presents with  . Vascular Access Problem    (Consider location/radiation/quality/duration/timing/severity/associated sxs/prior treatment) HPI Comments: 66 yo female with ESRD on T/Th/Sat dialysis. No recent missed dialysis. Presents every couple days for dialysis. No edema, chest pain or dyspnea. Is getting over a recent URI, but no fevers. No complaints other than wanting dialysis.  Patient is a 66 y.o. female presenting with general illness.  Illness  The current episode started today. The problem occurs continuously. The problem has been unchanged. Current severity: none. The symptoms are relieved by nothing. The symptoms are aggravated by nothing. Associated symptoms include congestion and URI. Pertinent negatives include no fever, no abdominal pain, no constipation, no nausea, no vomiting and no headaches. There were no sick contacts.    Past Medical History  Diagnosis Date  . Chronic kidney disease     esrd  . Diabetes mellitus   . Hyperlipidemia   . Hypertension   . Renal disorder   . Dialysis care     tues, thurs, sat  . 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 Carotid endarterectomy 2002    left  . Btl   . Rectal fissurectomy   . Av fistula placement 10/30/2010    right brachiocephalic   . Fistulogram     Family History  Problem Relation Age of Onset  . COPD Mother   . Kidney disease Mother   . Heart disease Father   . Lymphoma Son     History  Substance Use Topics  . Smoking status: Current Everyday Smoker -- 1.0 packs/day for 50 years    Types: Cigarettes  . Smokeless tobacco: Never Used  . Alcohol Use: No    OB History    Grav Para Term Preterm Abortions TAB SAB Ect Mult Living                  Review of Systems  Constitutional: Negative for fever and  chills.  HENT: Positive for congestion.   Respiratory: Negative for shortness of breath.   Cardiovascular: Negative for chest pain and leg swelling.  Gastrointestinal: Negative for nausea, vomiting, abdominal pain and constipation.  Genitourinary: Negative for urgency, decreased urine volume and difficulty urinating.  Musculoskeletal:       No edema  Skin: Negative for wound.  Neurological: Negative for headaches.  Psychiatric/Behavioral: Negative for confusion.  All other systems reviewed and are negative.    Allergies  Codeine; Epinephrine; and Percocet  Home Medications   Current Outpatient Rx  Name Route Sig Dispense Refill  . ALLOPURINOL 100 MG PO TABS Oral Take 100 mg by mouth daily.      Marland Kitchen AMLODIPINE BESYLATE 5 MG PO TABS Oral Take 5 mg by mouth at bedtime.     Marland Kitchen CALCIUM ACETATE 667 MG PO CAPS Oral Take 1,334 mg by mouth 3 (three) times daily with meals.     Marland Kitchen COENZYME Q10 150 MG PO CAPS Oral Take 300 mg by mouth daily.     . COLCHICINE 0.6 MG PO TABS Oral Take 0.6 mg by mouth daily as needed. For gout.    . CYCLOBENZAPRINE HCL 10 MG PO TABS Oral Take 10 mg by mouth 3 (three) times daily as needed. For muscle spasms    .  DOCUSATE SODIUM 100 MG PO CAPS Oral Take 200 mg by mouth at bedtime.      Marland Kitchen HYDRALAZINE HCL 25 MG PO TABS Oral Take 50 mg by mouth 3 (three) times daily.     . L-METHYLFOLATE-B6-B12 3-35-2 MG PO TABS Oral Take 1 tablet by mouth 2 (two) times daily.     Marland Kitchen LOSARTAN POTASSIUM 50 MG PO TABS Oral Take 50 mg by mouth at bedtime.     Marland Kitchen METOPROLOL SUCCINATE ER 50 MG PO TB24 Oral Take 50 mg by mouth at bedtime.     Marland Kitchen RENA-VITE PO TABS Oral Take 1 tablet by mouth daily.      Marland Kitchen OVER THE COUNTER MEDICATION Oral Take 60-90 mLs by mouth daily. OTC. QiNopal    . REPAGLINIDE 0.5 MG PO TABS Oral Take 0.5 mg by mouth 3 (three) times daily before meals.        BP 158/61  Pulse 84  Temp(Src) 97.3 F (36.3 C) (Oral)  Resp 20  SpO2 100%  Physical Exam  Constitutional:  She is oriented to person, place, and time. She appears well-developed and well-nourished. No distress.  HENT:  Head: Normocephalic and atraumatic.  Right Ear: External ear normal.  Left Ear: External ear normal.  Nose: Nose normal.  Mouth/Throat: Oropharynx is clear and moist.  Neck: Neck supple.  Cardiovascular: Normal rate, regular rhythm, normal heart sounds and intact distal pulses.   Pulmonary/Chest: Effort normal and breath sounds normal. No respiratory distress. She has no wheezes. She has no rales.  Abdominal: Soft. She exhibits no distension. There is no tenderness.  Musculoskeletal: She exhibits no edema.  Lymphadenopathy:    She has no cervical adenopathy.  Neurological: She is alert and oriented to person, place, and time.  Skin: Skin is warm and dry. She is not diaphoretic. No pallor.    ED Course  Procedures (including critical care time)  Labs Reviewed - No data to display No results found.   1. End stage renal disease on dialysis       MDM  Dr. Bascom Levels contacted, will see patient and do dialysis orders after rounding on patients. Labs will be drawn when they access her port for dialysis. [facility] contacted for admission.        Pricilla Loveless, MD 06/08/11 1850

## 2011-06-08 NOTE — H&P (Signed)
PCP:   [name], MD, MD   Chief Complaint:  Dialysis required.  HPI: Alexandra [name] is an 66 y.o. female with ESRD on HD who presents for her routine Dialysis Treatment. She receives Dialysis on Tuesdays Thursdays and Saturdays. Mentions that she usually comes to the hospital for her dialysis.  She mentions that she doesn't have a outpatient facility to go to as an outpatient so she comes to the hospital for her dialysis treatments.  Otherwise has no acute complaints today.  Allergies:   Allergies  Allergen Reactions  . Codeine Other (See Comments)    Passes out  . Epinephrine Other (See Comments)    Tachycardia, diaphoresis, syncope  . Percocet (Oxycodone-Acetaminophen) Other (See Comments)    Passes  out      Past Medical History  Diagnosis Date  . Chronic kidney disease     esrd  . Diabetes mellitus   . Hyperlipidemia   . Hypertension   . Renal disorder   . Dialysis care     tues, thurs, sat  . 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 Carotid endarterectomy 2002    left  . Btl   . Rectal fissurectomy   . Av fistula placement 10/30/2010    right brachiocephalic   . Fistulogram     Prior to Admission medications   Medication Sig Start Date End Date Taking? Authorizing Provider  allopurinol (ZYLOPRIM) 100 MG tablet Take 100 mg by mouth daily.     Yes Historical Provider, MD  amLODipine (NORVASC) 5 MG tablet Take 5 mg by mouth at bedtime.    Yes Historical Provider, MD  calcium acetate (PHOSLO) 667 MG capsule Take 1,334 mg by mouth 3 (three) times daily with meals.    Yes Historical Provider, MD  Coenzyme Q10 150 MG CAPS Take 300 mg by mouth daily.    Yes Historical Provider, MD  colchicine 0.6 MG tablet Take 0.6 mg by mouth daily as needed. For gout.   Yes Historical Provider, MD  cyclobenzaprine (FLEXERIL) 10 MG tablet Take 10 mg by mouth 3 (three) times daily as needed. For muscle spasms   Yes Historical Provider,  MD  docusate sodium (COLACE) 100 MG capsule Take 200 mg by mouth at bedtime.     Yes Historical Provider, MD  hydrALAZINE (APRESOLINE) 25 MG tablet Take 50 mg by mouth 3 (three) times daily.    Yes Historical Provider, MD  l-methylfolate-B6-B12 (METANX) 3-35-2 MG TABS Take 1 tablet by mouth 2 (two) times daily.    Yes Historical Provider, MD  losartan (COZAAR) 50 MG tablet Take 50 mg by mouth at bedtime.    Yes Historical Provider, MD  metoprolol (TOPROL-XL) 50 MG 24 hr tablet Take 50 mg by mouth at bedtime.    Yes Historical Provider, MD  multivitamin (RENA-VIT) TABS tablet Take 1 tablet by mouth daily.     Yes Historical Provider, MD  OVER THE COUNTER MEDICATION Take 60-90 mLs by mouth daily. OTC. QiNopal   Yes Historical Provider, MD  repaglinide (PRANDIN) 0.5 MG tablet Take 0.5 mg by mouth 3 (three) times daily before meals.     Yes Historical Provider, MD    Social History:  reports that she has been smoking Cigarettes.  She has a 50 pack-year smoking history. She has never used smokeless tobacco. She reports that she does not drink alcohol or use illicit drugs.  Family History  Problem Relation Age of  Onset  . COPD Mother   . Kidney disease Mother   . Heart disease Father   . Lymphoma Son     Review of Systems:  Constitutional: Denies fever, chills, diaphoresis, appetite change and fatigue.  HEENT: Denies photophobia, eye pain, redness, hearing loss, ear pain, congestion, sore throat, rhinorrhea, sneezing, mouth sores, trouble swallowing, neck pain, neck stiffness and tinnitus.   Respiratory: Denies SOB, DOE, cough, chest tightness,  and wheezing.   Cardiovascular: Denies chest pain, palpitations and leg swelling.  Gastrointestinal: Denies nausea, vomiting, abdominal pain, diarrhea, constipation, blood in stool and abdominal distention.  Genitourinary: Denies dysuria, urgency, frequency, hematuria, flank pain and difficulty urinating.  Musculoskeletal: Denies myalgias, back pain,  joint swelling, arthralgias and gait problem.  Skin: Denies pallor, rash and wound.  Neurological: Denies dizziness, seizures, syncope, weakness, light-headedness, numbness and headaches.  Hematological: Denies adenopathy. Easy bruising, personal or family bleeding history  Psychiatric/Behavioral: Denies suicidal ideation, mood changes, confusion, nervousness, sleep disturbance and agitation   Physical Exam: Blood pressure 158/61, pulse 84, temperature 97.3 F (36.3 C), temperature source Oral, resp. rate 20, SpO2 100.00%. General: Alert, awake, oriented x3, in no acute distress. HEENT: atraumatic, normocephalic, EOMI, no lymphadenopathy on visual examination, no goiter. Heart: Regular rate and rhythm, without murmurs, rubs, gallops. Lungs: Clear to auscultation bilaterally. Abdomen: Soft, nontender, nondistended, positive bowel sounds. Extremities: No clubbing cyanosis or edema with positive pedal pulses. Neuro: Grossly intact, nonfocal.  Labs on Admission:  No results found for this or any previous visit (from the past 48 hour(s)).  Radiological Exams on Admission: No results found.  Assessment/Plan ESRD: Will admit for Dialysis.  Reportedly Dr. Bascom Levels has already been contacted and will come down and place orders for dialysis.  Otherwise will continue home regimen for her other medical conditions.  SSI coverage PRN elevated blood sugars.   Time Spent on Admission: 30 minutes.  Penny Pia [facility] Pager: [phone] 06/08/2011, 5:59 PM

## 2011-06-10 ENCOUNTER — Inpatient Hospital Stay (HOSPITAL_COMMUNITY): Payer: Medicare HMO

## 2011-06-10 ENCOUNTER — Observation Stay (HOSPITAL_COMMUNITY)
Admission: EM | Admit: 2011-06-10 | Discharge: 2011-06-11 | DRG: 683 | Discharge status: Against Medical Advice | Payer: Medicare HMO | Attending: Internal Medicine | Admitting: Internal Medicine

## 2011-06-10 ENCOUNTER — Encounter (HOSPITAL_COMMUNITY): Payer: Self-pay | Admitting: Emergency Medicine

## 2011-06-10 DIAGNOSIS — I120 Hypertensive chronic kidney disease with stage 5 chronic kidney disease or end stage renal disease: Secondary | ICD-10-CM | POA: Diagnosis present

## 2011-06-10 DIAGNOSIS — N186 End stage renal disease: Secondary | ICD-10-CM | POA: Diagnosis present

## 2011-06-10 DIAGNOSIS — N19 Unspecified kidney failure: Principal | ICD-10-CM | POA: Diagnosis present

## 2011-06-10 DIAGNOSIS — F17200 Nicotine dependence, unspecified, uncomplicated: Secondary | ICD-10-CM | POA: Diagnosis present

## 2011-06-10 DIAGNOSIS — M109 Gout, unspecified: Secondary | ICD-10-CM | POA: Diagnosis present

## 2011-06-10 DIAGNOSIS — E785 Hyperlipidemia, unspecified: Secondary | ICD-10-CM | POA: Diagnosis present

## 2011-06-10 DIAGNOSIS — Z888 Allergy status to other drugs, medicaments and biological substances status: Secondary | ICD-10-CM

## 2011-06-10 DIAGNOSIS — D638 Anemia in other chronic diseases classified elsewhere: Secondary | ICD-10-CM | POA: Diagnosis present

## 2011-06-10 DIAGNOSIS — Z79899 Other long term (current) drug therapy: Secondary | ICD-10-CM

## 2011-06-10 DIAGNOSIS — E119 Type 2 diabetes mellitus without complications: Secondary | ICD-10-CM | POA: Diagnosis present

## 2011-06-10 LAB — GLUCOSE, CAPILLARY: Glucose-Capillary: 132 mg/dL — ABNORMAL HIGH (ref 70–99)

## 2011-06-10 MED ORDER — HEPARIN SODIUM (PORCINE) 1000 UNIT/ML DIALYSIS
20.0000 [IU]/kg | INTRAMUSCULAR | Status: DC | PRN
  Filled 2011-06-10: qty 2

## 2011-06-10 NOTE — ED Notes (Signed)
Admitting MD at bedside.

## 2011-06-10 NOTE — ED Notes (Signed)
Pt to hemodialysis. Escorted by nurse tech. Orders written. [name], RN aware of pt.

## 2011-06-10 NOTE — Consult Note (Signed)
Pt. Is in the ER .orders are written.

## 2011-06-10 NOTE — ED Provider Notes (Signed)
History     CSN: [id number]  Arrival date & time 06/10/11  1906   First MD Initiated Contact with Patient 06/10/11 2103      Chief Complaint  Patient presents with  . Vascular Access Problem    (Consider location/radiation/quality/duration/timing/severity/associated sxs/prior treatment) HPI  Patient relates she is here for her dialysis. She normally gets dialysis on Tuesday, Thursday, and Saturday. Today is Thursday. She states she's feels fine and she is a Engineer, civil (consulting) and she felt bad she would tell me so.  PCP Dr. Leretha Dykes  Past Medical History  Diagnosis Date  . Chronic kidney disease     esrd  . Diabetes mellitus   . Hyperlipidemia   . Hypertension   . Renal disorder   . Dialysis care     tues, thurs, sat  . 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 Carotid endarterectomy 2002    left  . Btl   . Rectal fissurectomy   . Av fistula placement 10/30/2010    right brachiocephalic   . Fistulogram     Family History  Problem Relation Age of Onset  . COPD Mother   . Kidney disease Mother   . Heart disease Father   . Lymphoma Son     History  Substance Use Topics  . Smoking status: Current Everyday Smoker -- 1.0 packs/day for 50 years    Types: Cigarettes  . Smokeless tobacco: Never Used  . Alcohol Use: No  lives alone States she is still doing drug counseling and PTSD therapy.   OB History    Grav Para Term Preterm Abortions TAB SAB Ect Mult Living                  Review of Systems  Allergies  Codeine; Epinephrine; and Percocet  Home Medications   Current Outpatient Rx  Name Route Sig Dispense Refill  . ALLOPURINOL 100 MG PO TABS Oral Take 100 mg by mouth daily.      Marland Kitchen AMLODIPINE BESYLATE 5 MG PO TABS Oral Take 5 mg by mouth at bedtime.     Marland Kitchen CALCIUM ACETATE 667 MG PO CAPS Oral Take 1,334 mg by mouth 3 (three) times daily with meals.     Marland Kitchen COENZYME Q10 150 MG PO CAPS Oral Take 300 mg by mouth daily.     . COLCHICINE  0.6 MG PO TABS Oral Take 0.6 mg by mouth daily as needed. For gout.    . CYCLOBENZAPRINE HCL 10 MG PO TABS Oral Take 10 mg by mouth 3 (three) times daily as needed. For muscle spasms    . DOCUSATE SODIUM 100 MG PO CAPS Oral Take 200 mg by mouth at bedtime.      Marland Kitchen HYDRALAZINE HCL 25 MG PO TABS Oral Take 50 mg by mouth 3 (three) times daily.     . L-METHYLFOLATE-B6-B12 3-35-2 MG PO TABS Oral Take 1 tablet by mouth 2 (two) times daily.     Marland Kitchen LOSARTAN POTASSIUM 50 MG PO TABS Oral Take 50 mg by mouth at bedtime.     Marland Kitchen METOPROLOL SUCCINATE ER 50 MG PO TB24 Oral Take 50 mg by mouth at bedtime.     Marland Kitchen RENA-VITE PO TABS Oral Take 1 tablet by mouth daily.      Marland Kitchen OVER THE COUNTER MEDICATION Oral Take 60-90 mLs by mouth daily. OTC. QiNopal    . REPAGLINIDE 0.5 MG PO TABS Oral Take 0.5 mg by  mouth 3 (three) times daily before meals.        BP 175/64  Pulse 82  Temp(Src) 98 F (36.7 C) (Oral)  Resp 18  SpO2 93%  Vital signs normal except hypertension   Physical Exam  Nursing note and vitals reviewed. Constitutional: She is oriented to person, place, and time. She appears well-developed and well-nourished.  Non-toxic appearance. She does not appear ill. No distress.  HENT:  Head: Normocephalic and atraumatic.  Right Ear: External ear normal.  Left Ear: External ear normal.  Nose: Nose normal. No mucosal edema or rhinorrhea.  Mouth/Throat: Oropharynx is clear and moist and mucous membranes are normal. No dental abscesses or uvula swelling.  Eyes: Conjunctivae and EOM are normal. Pupils are equal, round, and reactive to light.  Neck: Normal range of motion and full passive range of motion without pain. Neck supple.  Cardiovascular: Normal rate, regular rhythm and normal heart sounds.  Exam reveals no gallop and no friction rub.   No murmur heard. Pulmonary/Chest: Effort normal and breath sounds normal. No respiratory distress. She has no wheezes. She has no rhonchi. She has no rales. She exhibits no  tenderness and no crepitus.  Abdominal: Normal appearance.  Musculoskeletal: Normal range of motion. She exhibits no edema and no tenderness.       Moves all extremities well.   Neurological: She is alert and oriented to person, place, and time. She has normal strength. No cranial nerve deficit.  Skin: Skin is warm, dry and intact. No rash noted. No erythema. No pallor.  Psychiatric: She has a normal mood and affect. Her speech is normal and behavior is normal. Her mood appears not anxious.    ED Course  Procedures (including critical care time)  22:10 Dr Toniann Fail admit to med-surg, team 8 Dr Carmell Austria  Results for orders placed during the hospital encounter of 06/10/11  GLUCOSE, CAPILLARY      Component Value Range   Glucose-Capillary 132 (*) 70 - 99 (mg/dL)   Comment 1 Notify RN     Comment 2 Documented in Chart     Laboratory interpretation all normal except mild hyperglycemia     1. Renal failure     Plan admission for dialysis  MDM          Ward Givens, MD 06/10/11 2214

## 2011-06-10 NOTE — H&P (Signed)
Alexandra [name] is an 66 y.o. female.   Nephrologist - Dr.Frazier.   Chief Complaint: Has come for routine dialysis. HPI: 66 year-old female with known history of ESRD on hemodialysis, diabetes mellitus type 2, hypertension has come for routine dialysis to the hospital. At this time nephrologist has arranged for dialysis and hospitalist was requested for admission. Patient has no specific complaints denies any chest pain shortness of breath nausea vomiting abdominal pain headache focal deficit fever or chills.  Past Medical History  Diagnosis Date  . Chronic kidney disease     esrd  . Diabetes mellitus   . Hyperlipidemia   . Hypertension   . Renal disorder   . Dialysis care     tues, thurs, sat  . 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 Carotid endarterectomy 2002    left  . Btl   . Rectal fissurectomy   . Av fistula placement 10/30/2010    right brachiocephalic   . Fistulogram     Family History  Problem Relation Age of Onset  . COPD Mother   . Kidney disease Mother   . Heart disease Father   . Lymphoma Son    Social History:  reports that she has been smoking Cigarettes.  She has a 50 pack-year smoking history. She has never used smokeless tobacco. She reports that she does not drink alcohol or use illicit drugs.  Allergies:  Allergies  Allergen Reactions  . Codeine Other (See Comments)    Passes out  . Epinephrine Other (See Comments)    Tachycardia, diaphoresis, syncope  . Percocet (Oxycodone-Acetaminophen) Other (See Comments)    Passes  out    Medications Prior to Admission  Medication Dose Route Frequency Provider Last Rate Last Dose  . heparin injection 1,200 Units  20 Units/kg Dialysis PRN Jeri Cos, MD      . heparin injection 20 Units/kg  20 Units/kg Dialysis PRN Jeri Cos, MD      . DISCONTD: guaiFENesin (ROBITUSSIN) 100 MG/5ML solution 100 mg  5 mL Oral Q4H PRN Jeri Cos, MD   100 mg at 06/08/11  2131  . DISCONTD: insulin aspart (novoLOG) injection 0-15 Units  0-15 Units Subcutaneous TID WC Penny Pia, MD       Medications Prior to Admission  Medication Sig Dispense Refill  . allopurinol (ZYLOPRIM) 100 MG tablet Take 100 mg by mouth daily.        Marland Kitchen amLODipine (NORVASC) 5 MG tablet Take 5 mg by mouth at bedtime.       . calcium acetate (PHOSLO) 667 MG capsule Take 1,334 mg by mouth 3 (three) times daily with meals.       . Coenzyme Q10 150 MG CAPS Take 300 mg by mouth daily.       . colchicine 0.6 MG tablet Take 0.6 mg by mouth daily as needed. For gout.      . cyclobenzaprine (FLEXERIL) 10 MG tablet Take 10 mg by mouth 3 (three) times daily as needed. For muscle spasms      . docusate sodium (COLACE) 100 MG capsule Take 200 mg by mouth at bedtime.        . hydrALAZINE (APRESOLINE) 25 MG tablet Take 50 mg by mouth 3 (three) times daily.       Marland Kitchen l-methylfolate-B6-B12 (METANX) 3-35-2 MG TABS Take 1 tablet by mouth 2 (two) times daily.       Marland Kitchen losartan (COZAAR) 50 MG  tablet Take 50 mg by mouth at bedtime.       . metoprolol (TOPROL-XL) 50 MG 24 hr tablet Take 50 mg by mouth at bedtime.       . multivitamin (RENA-VIT) TABS tablet Take 1 tablet by mouth daily.        Marland Kitchen OVER THE COUNTER MEDICATION Take 60-90 mLs by mouth daily. OTC. QiNopal      . repaglinide (PRANDIN) 0.5 MG tablet Take 0.5 mg by mouth 3 (three) times daily before meals.          Results for orders placed during the hospital encounter of 06/10/11 (from the past 48 hour(s))  GLUCOSE, CAPILLARY     Status: Abnormal   Collection Time   06/10/11  9:08 PM      Component Value Range Comment   Glucose-Capillary 132 (*) 70 - 99 (mg/dL)    Comment 1 Notify RN      Comment 2 Documented in Chart      No results found.  Review of Systems  Constitutional: Negative.   HENT: Negative.   Eyes: Negative.   Respiratory: Negative.   Cardiovascular: Negative.   Gastrointestinal: Negative.   Genitourinary: Negative.     Musculoskeletal: Negative.   Skin: Negative.   Neurological: Negative.   Endo/Heme/Allergies: Negative.   Psychiatric/Behavioral: Negative.     Blood pressure 175/64, pulse 82, temperature 98 F (36.7 C), temperature source Oral, resp. rate 18, SpO2 93.00%. Physical Exam  Constitutional: She is oriented to person, place, and time. She appears well-developed and well-nourished. No distress.  HENT:  Head: Normocephalic and atraumatic.  Eyes: Conjunctivae are normal. Pupils are equal, round, and reactive to light.  Neck: Normal range of motion. Neck supple.  Cardiovascular: Normal rate and regular rhythm.   Respiratory: Effort normal and breath sounds normal. No respiratory distress. She has no wheezes. She has no rales.  GI: Soft. Bowel sounds are normal. She exhibits no distension. There is no tenderness. There is no rebound.  Musculoskeletal: Normal range of motion. She exhibits no edema and no tenderness.  Neurological: She is alert and oriented to person, place, and time.       Moves all extremities.  Skin: Skin is warm and dry. She is not diaphoretic.  Psychiatric: Her behavior is normal.     Assessment/Plan #1. ESRD on hemodialysis - patient is to receive dialysis tonight per nephrologist orders. #2. History of diabetes mellitus2, gout, hypertension - continue present medications.  CODE STATUS - full code.  Eduard Clos 06/10/2011, 10:48 PM

## 2011-06-10 NOTE — ED Notes (Signed)
PT. IS HERE FOR HER ROUTINE HEMODIALYSIS.

## 2011-06-11 LAB — CBC
HCT: 32.8 % — ABNORMAL LOW (ref 36.0–46.0)
Hemoglobin: 10.8 g/dL — ABNORMAL LOW (ref 12.0–15.0)
MCHC: 32.9 g/dL (ref 30.0–36.0)
MCV: 86.5 fL (ref 78.0–100.0)

## 2011-06-11 LAB — RENAL FUNCTION PANEL
Albumin: 3.3 g/dL — ABNORMAL LOW (ref 3.5–5.2)
BUN: 43 mg/dL — ABNORMAL HIGH (ref 6–23)
CO2: 29 mEq/L (ref 19–32)
Chloride: 94 mEq/L — ABNORMAL LOW (ref 96–112)
Creatinine, Ser: 7.45 mg/dL — ABNORMAL HIGH (ref 0.50–1.10)

## 2011-06-11 LAB — DIFFERENTIAL
Basophils Relative: 0 % (ref 0–1)
Monocytes Absolute: 1.5 10*3/uL — ABNORMAL HIGH (ref 0.1–1.0)
Monocytes Relative: 20 % — ABNORMAL HIGH (ref 3–12)
Neutro Abs: 4.3 10*3/uL (ref 1.7–7.7)

## 2011-06-11 MED ORDER — GUAIFENESIN 100 MG/5ML PO SOLN
5.0000 mL | ORAL | Status: DC | PRN
  Administered 2011-06-11: 100 mg via ORAL
  Filled 2011-06-11: qty 5

## 2011-06-11 MED ORDER — DARBEPOETIN ALFA-POLYSORBATE 100 MCG/0.5ML IJ SOLN
INTRAMUSCULAR | Status: AC
  Filled 2011-06-11: qty 0.5

## 2011-06-11 MED ORDER — PARICALCITOL 5 MCG/ML IV SOLN
INTRAVENOUS | Status: AC
  Administered 2011-06-11: 1 ug
  Filled 2011-06-11: qty 1

## 2011-06-11 MED ORDER — CYCLOBENZAPRINE HCL 5 MG PO TABS
5.0000 mg | ORAL_TABLET | Freq: Once | ORAL | Status: AC
  Administered 2011-06-11: 5 mg via ORAL
  Filled 2011-06-11: qty 1

## 2011-06-11 MED ORDER — DARBEPOETIN ALFA-POLYSORBATE 100 MCG/0.5ML IJ SOLN
100.0000 ug | Freq: Once | INTRAMUSCULAR | Status: AC
  Administered 2011-06-11: 100 ug via INTRAVENOUS

## 2011-06-11 NOTE — Discharge Summary (Signed)
Admit date: 06/10/2011 Discharge date: 06/11/2011  Primary Care Physician:  [name], MD, MD  Patient was discharge prior to my evaluation.   Discharge Diagnoses:    . End stage renal disease on dialysis 05/08/2011   . Gout 03/04/2011   . DM (diabetes mellitus) 02/23/2011   . Anemia of chronic disease 02/23/2011   . HTN (hypertension) 02/23/2011              DISCHARGE MEDICATION: Medication List  As of 06/11/2011 11:00 AM   ASK your doctor about these medications         allopurinol 100 MG tablet   Commonly known as: ZYLOPRIM   Take 100 mg by mouth daily.      amLODipine 5 MG tablet   Commonly known as: NORVASC   Take 5 mg by mouth at bedtime.      calcium acetate 667 MG capsule   Commonly known as: PHOSLO   Take 1,334 mg by mouth 3 (three) times daily with meals.      Coenzyme Q10 150 MG Caps   Take 300 mg by mouth daily.      colchicine 0.6 MG tablet   Take 0.6 mg by mouth daily as needed. For gout.      cyclobenzaprine 10 MG tablet   Commonly known as: FLEXERIL   Take 10 mg by mouth 3 (three) times daily as needed. For muscle spasms      docusate sodium 100 MG capsule   Commonly known as: COLACE   Take 200 mg by mouth at bedtime.      hydrALAZINE 25 MG tablet   Commonly known as: APRESOLINE   Take 50 mg by mouth 3 (three) times daily.      l-methylfolate-B6-B12 3-35-2 MG Tabs   Commonly known as: METANX   Take 1 tablet by mouth 2 (two) times daily.      losartan 50 MG tablet   Commonly known as: COZAAR   Take 50 mg by mouth at bedtime.      metoprolol succinate 50 MG 24 hr tablet   Commonly known as: TOPROL-XL   Take 50 mg by mouth at bedtime.      multivitamin Tabs tablet   Take 1 tablet by mouth daily.      OVER THE COUNTER MEDICATION   Take 60-90 mLs by mouth daily. OTC. QiNopal      repaglinide 0.5 MG tablet   Commonly known as: PRANDIN   Take 0.5 mg by mouth 3 (three) times daily before meals.               No results  found for this or any previous visit (from the past 240 hour(s)).  BRIEF ADMITTING H & P: HPI: 66 year-old female with known history of ESRD on hemodialysis, diabetes mellitus type 2, hypertension has come for routine dialysis to the hospital. At this time nephrologist has arranged for dialysis and hospitalist was requested for admission. Patient has no specific complaints denies any chest pain shortness of breath nausea vomiting abdominal pain headache focal deficit fever or chills.  Patient was discharge prior to my evaluation.   Blood pressure 190/66, pulse 85, temperature 98.1 F (36.7 C), temperature source Oral, resp. rate 16, weight 58.4 kg (128 lb 12 oz), SpO2 93.00%.   Basename 06/10/11 2339 06/08/11 1935  NA 135 137  K 4.3 4.9  CL 94* 96  CO2 29 30  GLUCOSE 130* 162*  BUN 43* 48*  CREATININE 7.45* 7.92*  CALCIUM 9.6 9.5  MG -- --  PHOS 1.9* 1.4*    Basename 06/10/11 2339 06/08/11 1935  AST -- --  ALT -- --  ALKPHOS -- --  BILITOT -- --  PROT -- --  ALBUMIN 3.3* 3.1*    Basename 06/10/11 2339 06/08/11 1935  WBC 7.8 6.3  NEUTROABS 4.3 --  HGB 10.8* 11.0*  HCT 32.8* 34.4*  MCV 86.5 87.5  PLT 281 270    Signed: Allysen Lazo M.D. 06/11/2011, 11:00 AM

## 2011-06-11 NOTE — Progress Notes (Signed)
   CARE MANAGEMENT NOTE 06/11/2011  Patient:  MALKA, BOCEK   Account Number:  [id number]  Date Initiated:  06/11/2011  Documentation initiated by:  Onnie Boer  Subjective/Objective Assessment:   PT WAS ADMITTED FOR HD     Action/Plan:   PROGRESSION OF CARE AND DISCHARGE PLANNING   Anticipated DC Date:     Anticipated DC Plan:           Choice offered to / List presented to:             Status of service:  Completed, signed off Medicare Important Message given?   (If response is "NO", the following Medicare IM given date fields will be blank) Date Medicare IM given:   Date Additional Medicare IM given:    Discharge Disposition:  HOME/SELF CARE  Per UR Regulation:  Reviewed for med. necessity/level of care/duration of stay  If discussed at Long Length of Stay Meetings, dates discussed:    Comments:  06/11/11 Onnie Boer, RN, BSN 1058 UR COMPLETED

## 2011-06-12 ENCOUNTER — Emergency Department (HOSPITAL_COMMUNITY): Payer: Medicare HMO

## 2011-06-12 ENCOUNTER — Inpatient Hospital Stay (HOSPITAL_COMMUNITY)
Admission: EM | Admit: 2011-06-12 | Discharge: 2011-06-12 | DRG: 682 | Disposition: A | Discharge status: Home / Self Care | Payer: Medicare HMO | Attending: Internal Medicine | Admitting: Internal Medicine

## 2011-06-12 ENCOUNTER — Encounter (HOSPITAL_COMMUNITY): Payer: Self-pay | Admitting: Emergency Medicine

## 2011-06-12 DIAGNOSIS — E119 Type 2 diabetes mellitus without complications: Secondary | ICD-10-CM | POA: Diagnosis present

## 2011-06-12 DIAGNOSIS — Z992 Dependence on renal dialysis: Secondary | ICD-10-CM

## 2011-06-12 DIAGNOSIS — I120 Hypertensive chronic kidney disease with stage 5 chronic kidney disease or end stage renal disease: Principal | ICD-10-CM | POA: Diagnosis present

## 2011-06-12 DIAGNOSIS — N186 End stage renal disease: Secondary | ICD-10-CM

## 2011-06-12 DIAGNOSIS — I10 Essential (primary) hypertension: Secondary | ICD-10-CM | POA: Diagnosis present

## 2011-06-12 HISTORY — DX: End stage renal disease: N18.6

## 2011-06-12 HISTORY — DX: End stage renal disease: Z99.2

## 2011-06-12 LAB — CBC
HCT: 34.4 % — ABNORMAL LOW (ref 36.0–46.0)
MCHC: 32.3 g/dL (ref 30.0–36.0)
MCV: 86.6 fL (ref 78.0–100.0)
RDW: 19.2 % — ABNORMAL HIGH (ref 11.5–15.5)
WBC: 7.3 10*3/uL (ref 4.0–10.5)

## 2011-06-12 LAB — RENAL FUNCTION PANEL
CO2: 28 mEq/L (ref 19–32)
Chloride: 96 mEq/L (ref 96–112)
Creatinine, Ser: 3.74 mg/dL — ABNORMAL HIGH (ref 0.50–1.10)
GFR calc non Af Amer: 11 mL/min — ABNORMAL LOW (ref >90)

## 2011-06-12 LAB — CREATININE, SERUM: GFR calc Af Amer: 13 mL/min — ABNORMAL LOW (ref >90)

## 2011-06-12 MED ORDER — ZOLPIDEM TARTRATE 5 MG PO TABS: 5.0000 mg | ORAL_TABLET | Freq: Every evening | ORAL | Status: DC | PRN

## 2011-06-12 MED ORDER — RENA-VITE PO TABS
1.0000 | ORAL_TABLET | Freq: Every day | ORAL | Status: DC
  Filled 2011-06-12: qty 1

## 2011-06-12 MED ORDER — ACETAMINOPHEN 650 MG RE SUPP: 650.0000 mg | Freq: Four times a day (QID) | RECTAL | Status: DC | PRN

## 2011-06-12 MED ORDER — COLCHICINE 0.6 MG PO TABS
0.6000 mg | ORAL_TABLET | Freq: Every day | ORAL | Status: DC | PRN
Start: 2011-06-12 — End: 2011-06-13
  Filled 2011-06-12: qty 1

## 2011-06-12 MED ORDER — HYDRALAZINE HCL 50 MG PO TABS
50.0000 mg | ORAL_TABLET | Freq: Three times a day (TID) | ORAL | Status: DC
  Filled 2011-06-12: qty 1

## 2011-06-12 MED ORDER — METOPROLOL SUCCINATE ER 50 MG PO TB24
50.0000 mg | ORAL_TABLET | Freq: Every day | ORAL | Status: DC
  Filled 2011-06-12: qty 1

## 2011-06-12 MED ORDER — HEPARIN SODIUM (PORCINE) 1000 UNIT/ML DIALYSIS: 20.0000 [IU]/kg | INTRAMUSCULAR | Status: DC | PRN

## 2011-06-12 MED ORDER — ENOXAPARIN SODIUM 30 MG/0.3ML ~~LOC~~ SOLN
30.0000 mg | SUBCUTANEOUS | Status: DC
  Filled 2011-06-12: qty 0.3

## 2011-06-12 MED ORDER — REPAGLINIDE 0.5 MG PO TABS
0.5000 mg | ORAL_TABLET | Freq: Three times a day (TID) | ORAL | Status: DC
  Filled 2011-06-12: qty 1

## 2011-06-12 MED ORDER — L-METHYLFOLATE-B6-B12 3-35-2 MG PO TABS
1.0000 | ORAL_TABLET | Freq: Two times a day (BID) | ORAL | Status: DC
  Filled 2011-06-12: qty 1

## 2011-06-12 MED ORDER — CALCIUM ACETATE 667 MG PO CAPS
1334.0000 mg | ORAL_CAPSULE | Freq: Three times a day (TID) | ORAL | Status: DC
  Filled 2011-06-12: qty 2

## 2011-06-12 MED ORDER — ACETAMINOPHEN 325 MG PO TABS: 650.0000 mg | ORAL_TABLET | Freq: Four times a day (QID) | ORAL | Status: DC | PRN

## 2011-06-12 MED ORDER — ONDANSETRON HCL 4 MG PO TABS: 4.0000 mg | ORAL_TABLET | Freq: Four times a day (QID) | ORAL | Status: DC | PRN

## 2011-06-12 MED ORDER — AMLODIPINE BESYLATE 5 MG PO TABS
5.0000 mg | ORAL_TABLET | Freq: Every day | ORAL | Status: DC
  Filled 2011-06-12: qty 1

## 2011-06-12 MED ORDER — DOCUSATE SODIUM 100 MG PO CAPS: 200.0000 mg | ORAL_CAPSULE | Freq: Every day | ORAL | Status: DC

## 2011-06-12 MED ORDER — DARBEPOETIN ALFA-POLYSORBATE 100 MCG/0.5ML IJ SOLN
100.0000 ug | Freq: Once | INTRAMUSCULAR | Status: AC
  Administered 2011-06-12: 100 ug via INTRAVENOUS

## 2011-06-12 MED ORDER — ONDANSETRON HCL 4 MG/2ML IJ SOLN: 4.0000 mg | Freq: Four times a day (QID) | INTRAMUSCULAR | Status: DC | PRN

## 2011-06-12 MED ORDER — DARBEPOETIN ALFA-POLYSORBATE 100 MCG/0.5ML IJ SOLN
INTRAMUSCULAR | Status: AC
  Administered 2011-06-12: 100 ug via INTRAVENOUS
  Filled 2011-06-12: qty 0.5

## 2011-06-12 MED ORDER — PARICALCITOL 5 MCG/ML IV SOLN
1.0000 ug | Freq: Once | INTRAVENOUS | Status: AC
  Administered 2011-06-12: 1 ug via INTRAVENOUS
  Filled 2011-06-12: qty 0.2

## 2011-06-12 MED ORDER — ALLOPURINOL 100 MG PO TABS: 100.0000 mg | ORAL_TABLET | Freq: Every day | ORAL | Status: DC

## 2011-06-12 MED ORDER — PARICALCITOL 5 MCG/ML IV SOLN
INTRAVENOUS | Status: AC
  Administered 2011-06-12: 1 ug via INTRAVENOUS
  Filled 2011-06-12: qty 1

## 2011-06-12 MED ORDER — LOSARTAN POTASSIUM 50 MG PO TABS
50.0000 mg | ORAL_TABLET | Freq: Every day | ORAL | Status: DC
  Filled 2011-06-12: qty 1

## 2011-06-12 NOTE — Consult Note (Signed)
Pt. In the ER  May have gained weight. 60.5kg.  See orders.

## 2011-06-12 NOTE — ED Provider Notes (Signed)
History     CSN: [id number]  Arrival date & time 06/12/11  1526   First MD Initiated Contact with Patient 06/12/11 1543      Chief Complaint  Patient presents with  . Vascular Access Problem    (Consider location/radiation/quality/duration/timing/severity/associated sxs/prior treatment) The history is provided by the patient. No language interpreter was used.  Patient presents for routine hemodialysis. She is a patient of Dr. Bascom Levels who evaluated her in ED triage. Routinely receives her dialysis on Tuesday, Thursday, Saturday. Today is Saturday. She has no complaints. She denies chest pain, shortness of breath, abdominal pain, fever, chills. She states that I am not to draw blood she will have this drawn in dialysis. She has requested that I call for admission now.  Past Medical History  Diagnosis Date  . Chronic kidney disease     esrd  . Diabetes mellitus   . Hyperlipidemia   . Hypertension   . Renal disorder   . Dialysis care     tues, thurs, sat  . 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 Carotid endarterectomy 2002    left  . Btl   . Rectal fissurectomy   . Av fistula placement 10/30/2010    right brachiocephalic   . Fistulogram     Family History  Problem Relation Age of Onset  . COPD Mother   . Kidney disease Mother   . Heart disease Father   . Lymphoma Son     History  Substance Use Topics  . Smoking status: Current Everyday Smoker -- 1.0 packs/day for 50 years    Types: Cigarettes  . Smokeless tobacco: Never Used  . Alcohol Use: No    OB History    Grav Para Term Preterm Abortions TAB SAB Ect Mult Living                  Review of Systems  Constitutional: Negative for fever, activity change, appetite change and fatigue.  HENT: Negative for congestion, sore throat, rhinorrhea, neck pain and neck stiffness.   Respiratory: Negative for cough and shortness of breath.   Cardiovascular: Negative for chest pain and  palpitations.  Gastrointestinal: Negative for nausea, vomiting and abdominal pain.  Genitourinary: Negative for dysuria, urgency, frequency and flank pain.  Neurological: Negative for dizziness, weakness, light-headedness, numbness and headaches.  All other systems reviewed and are negative.    Allergies  Codeine; Epinephrine; and Percocet  Home Medications   Current Outpatient Rx  Name Route Sig Dispense Refill  . ACETAMINOPHEN 500 MG PO TABS Oral Take 500 mg by mouth every 6 (six) hours as needed. For pain    . ALLOPURINOL 100 MG PO TABS Oral Take 100 mg by mouth daily.      Marland Kitchen AMLODIPINE BESYLATE 5 MG PO TABS Oral Take 5 mg by mouth at bedtime.     Marland Kitchen CALCIUM ACETATE 667 MG PO CAPS Oral Take 1,334 mg by mouth 3 (three) times daily with meals.     Marland Kitchen COENZYME Q10 150 MG PO CAPS Oral Take 300 mg by mouth daily.     . COLCHICINE 0.6 MG PO TABS Oral Take 0.6 mg by mouth daily as needed. For gout.    . CYCLOBENZAPRINE HCL 10 MG PO TABS Oral Take 10 mg by mouth 3 (three) times daily as needed. For muscle spasms    . DOCUSATE SODIUM 100 MG PO CAPS Oral Take 200 mg by mouth at  bedtime.      Marland Kitchen HYDRALAZINE HCL 25 MG PO TABS Oral Take 50 mg by mouth 3 (three) times daily.     . L-METHYLFOLATE-B6-B12 3-35-2 MG PO TABS Oral Take 1 tablet by mouth 2 (two) times daily.     Marland Kitchen LOSARTAN POTASSIUM 50 MG PO TABS Oral Take 50 mg by mouth at bedtime.     Marland Kitchen METOPROLOL SUCCINATE ER 50 MG PO TB24 Oral Take 50 mg by mouth at bedtime.     Marland Kitchen RENA-VITE PO TABS Oral Take 1 tablet by mouth daily.      Marland Kitchen OVER THE COUNTER MEDICATION Oral Take 60-90 mLs by mouth daily. OTC. QiNopal    . REPAGLINIDE 0.5 MG PO TABS Oral Take 0.5 mg by mouth 3 (three) times daily before meals.        BP 188/61  Pulse 92  Temp(Src) 98.3 F (36.8 C) (Oral)  Resp 20  SpO2 94%  Physical Exam  Nursing note and vitals reviewed. Constitutional: She is oriented to person, place, and time. She appears well-developed and well-nourished.  No distress.  HENT:  Head: Normocephalic and atraumatic.  Mouth/Throat: Oropharynx is clear and moist.  Eyes: Conjunctivae and EOM are normal. Pupils are equal, round, and reactive to light.  Neck: Normal range of motion. Neck supple.  Cardiovascular: Normal rate, regular rhythm, normal heart sounds and intact distal pulses.  Exam reveals no gallop and no friction rub.   No murmur heard. Pulmonary/Chest: Effort normal and breath sounds normal. No respiratory distress. She exhibits no tenderness.       Dialysis catheter to the right chest  Abdominal: Soft. Bowel sounds are normal. There is no tenderness.  Musculoskeletal: Normal range of motion. She exhibits no edema and no tenderness.  Neurological: She is alert and oriented to person, place, and time. No cranial nerve deficit.  Skin: Skin is warm and dry. No rash noted.    ED Course  Procedures (including critical care time)  Labs Reviewed - No data to display No results found.   1. End stage renal disease on dialysis       MDM  Dr. Leretha Dykes evaluated the patient in ED triage. I discussed the case with the [facility] hospitalist who accepted the patient for admission. She will go straight to dialysis.        Dayton Bailiff, MD 06/12/11 [phone]

## 2011-06-12 NOTE — Discharge Summary (Signed)
[facility] hospitalist discharge note. This 66 year old female is hospitalized on every Tuesday, Thursday, Saturday bases for hemodialysis. She is normally admitted, received dialysis, and then request to be discharged home. The same is true tonight. Patient is completing her dialysis in the dialysis nurse communicated to me that the patient requests discharge home. I have seen the patient at the bedside and she has no complaints. She states that she feels well post dialysis. Vital signs. Temperature 98.7, pulse 88, respiration 20, blood pressure 185/76. General appearance. Well-developed elderly female in no acute distress. She is alert, cooperative and pleasant. Cardiac. Regular rate and rhythm. Lungs. Clear and equal. Abdomen. Soft with positive bowel sounds. No pain. Impression/plan. Problem #1 end-stage renal disease. Patient has successfully completed dialysis. She appears clinically stable her exam and vital signs. Requested discharge home at this time and I held for placed a discharge order. She is to followup with her primary care physician.

## 2011-06-12 NOTE — ED Notes (Signed)
Pt. Here for inpatient dialysis

## 2011-06-12 NOTE — Progress Notes (Signed)
Pt d/c from unit w/ tech via wheelchair to ED waiting room to be picked up by her private taxi ride.  Pt left w/ no problems, vss, and all needs met

## 2011-06-12 NOTE — ED Notes (Signed)
Patient in room resting and waiting for dialysis. Lunch tray ordered.

## 2011-06-12 NOTE — H&P (Signed)
PCP:   [name], MD, MD   Chief Complaint:  "Here for dialysis."  HPI: Asked to admit ms harris for dialysis. She has already been seen by her nephrologist and has dialysis orders. Denies any complaints.  Review of Systems:  The patient denies anorexia, fever, weight loss,, vision loss, decreased hearing, hoarseness, chest pain, syncope, dyspnea on exertion, peripheral edema, balance deficits, hemoptysis, abdominal pain, melena, hematochezia, severe indigestion/heartburn, hematuria, incontinence, genital sores, muscle weakness, suspicious skin lesions, transient blindness, difficulty walking, depression, unusual weight change, abnormal bleeding, enlarged lymph nodes, angioedema, and breast masses.  Past Medical History: Past Medical History  Diagnosis Date  . Chronic kidney disease     esrd  . Diabetes mellitus   . Hyperlipidemia   . Hypertension   . Renal disorder   . Dialysis care     tues, thurs, sat  . Gout due to renal impairment    Past Surgical History  Procedure Date  . Carotid endarterectomy 2002    left  . Btl   . Rectal fissurectomy   . Av fistula placement 10/30/2010    right brachiocephalic   . Fistulogram     Medications: Prior to Admission medications   Medication Sig Start Date End Date Taking? Authorizing Provider  acetaminophen (TYLENOL) 500 MG tablet Take 500 mg by mouth every 6 (six) hours as needed. For pain   Yes Historical Provider, MD  allopurinol (ZYLOPRIM) 100 MG tablet Take 100 mg by mouth daily.     Yes Historical Provider, MD  amLODipine (NORVASC) 5 MG tablet Take 5 mg by mouth at bedtime.    Yes Historical Provider, MD  calcium acetate (PHOSLO) 667 MG capsule Take 1,334 mg by mouth 3 (three) times daily with meals.    Yes Historical Provider, MD  Coenzyme Q10 150 MG CAPS Take 300 mg by mouth daily.    Yes Historical Provider, MD  colchicine 0.6 MG tablet Take 0.6 mg by mouth daily as needed. For gout.   Yes Historical Provider, MD    cyclobenzaprine (FLEXERIL) 10 MG tablet Take 10 mg by mouth 3 (three) times daily as needed. For muscle spasms   Yes Historical Provider, MD  docusate sodium (COLACE) 100 MG capsule Take 200 mg by mouth at bedtime.     Yes Historical Provider, MD  hydrALAZINE (APRESOLINE) 25 MG tablet Take 50 mg by mouth 3 (three) times daily.    Yes Historical Provider, MD  l-methylfolate-B6-B12 (METANX) 3-35-2 MG TABS Take 1 tablet by mouth 2 (two) times daily.    Yes Historical Provider, MD  losartan (COZAAR) 50 MG tablet Take 50 mg by mouth at bedtime.    Yes Historical Provider, MD  metoprolol (TOPROL-XL) 50 MG 24 hr tablet Take 50 mg by mouth at bedtime.    Yes Historical Provider, MD  multivitamin (RENA-VIT) TABS tablet Take 1 tablet by mouth daily.     Yes Historical Provider, MD  OVER THE COUNTER MEDICATION Take 60-90 mLs by mouth daily. OTC. QiNopal   Yes Historical Provider, MD  repaglinide (PRANDIN) 0.5 MG tablet Take 0.5 mg by mouth 3 (three) times daily before meals.     Yes Historical Provider, MD    Allergies:   Allergies  Allergen Reactions  . Codeine Other (See Comments)    Passes out  . Epinephrine Other (See Comments)    Tachycardia, diaphoresis, syncope  . Percocet (Oxycodone-Acetaminophen) Other (See Comments)    Passes  out    Social History:  reports that  she has been smoking Cigarettes.  She has a 50 pack-year smoking history. She has never used smokeless tobacco. She reports that she does not drink alcohol or use illicit drugs.   Family History: Family History  Problem Relation Age of Onset  . COPD Mother   . Kidney disease Mother   . Heart disease Father   . Lymphoma Son     Physical Exam: Filed Vitals:   06/12/11 1538  BP: 188/61  Pulse: 92  Temp: 98.3 F (36.8 C)  TempSrc: Oral  Resp: 20  SpO2: 94%   Sitting up in chair, not in distress. Lungs clear. S1S2 heard. No murmurs. Abdomen soft and non tender. +BS CNS- grossly intact. Extremities- no pedal  edema   Labs on Admission:   [facility] 06/10/11 2339  NA 135  K 4.3  CL 94*  CO2 29  GLUCOSE 130*  BUN 43*  CREATININE 7.45*  CALCIUM 9.6  MG --  PHOS 1.9*    Basename 06/10/11 2339  AST --  ALT --  ALKPHOS --  BILITOT --  PROT --  ALBUMIN 3.3*   No results found for this basename: LIPASE:2,AMYLASE:2 in the last 72 hours  Basename 06/10/11 2339  WBC 7.8  NEUTROABS 4.3  HGB 10.8*  HCT 32.8*  MCV 86.5  PLT 281   No results found for this basename: CKTOTAL:3,CKMB:3,CKMBINDEX:3,TROPONINI:3 in the last 72 hours No results found for this basename: TSH,T4TOTAL,FREET3,T3FREE,THYROIDAB in the last 72 hours No results found for this basename: VITAMINB12:2,FOLATE:2,FERRITIN:2,TIBC:2,IRON:2,RETICCTPCT:2 in the last 72 hours  Radiological Exams on Admission: No results found.  Assessment ESRD on HD patient here for HD.  Plan .ESRD (end stage renal disease) on dialysis- Admit for HD today. Orders per Dr Bascom Levels. .Diabetes mellitus- controlled. Continue home mx. Marland KitchenHTN (hypertension), benign- resume home meds. dvt prophylaxis   Romen Yutzy [phone]. 06/12/2011, 5:47 PM

## 2011-06-15 ENCOUNTER — Encounter (HOSPITAL_COMMUNITY): Payer: Self-pay | Admitting: *Deleted

## 2011-06-15 ENCOUNTER — Observation Stay (HOSPITAL_COMMUNITY)
Admission: EM | Admit: 2011-06-15 | Discharge: 2011-06-15 | Disposition: A | Discharge status: Home / Self Care | Payer: Medicare HMO | Attending: Family Medicine | Admitting: Family Medicine

## 2011-06-15 ENCOUNTER — Other Ambulatory Visit: Payer: Self-pay | Admitting: Nephrology

## 2011-06-15 ENCOUNTER — Emergency Department (HOSPITAL_COMMUNITY): Payer: Medicare HMO

## 2011-06-15 DIAGNOSIS — E119 Type 2 diabetes mellitus without complications: Secondary | ICD-10-CM | POA: Insufficient documentation

## 2011-06-15 DIAGNOSIS — M109 Gout, unspecified: Secondary | ICD-10-CM | POA: Insufficient documentation

## 2011-06-15 DIAGNOSIS — E785 Hyperlipidemia, unspecified: Secondary | ICD-10-CM | POA: Insufficient documentation

## 2011-06-15 DIAGNOSIS — N186 End stage renal disease: Secondary | ICD-10-CM | POA: Insufficient documentation

## 2011-06-15 DIAGNOSIS — Z992 Dependence on renal dialysis: Secondary | ICD-10-CM | POA: Insufficient documentation

## 2011-06-15 DIAGNOSIS — I120 Hypertensive chronic kidney disease with stage 5 chronic kidney disease or end stage renal disease: Secondary | ICD-10-CM | POA: Insufficient documentation

## 2011-06-15 LAB — RENAL FUNCTION PANEL
BUN: 49 mg/dL — ABNORMAL HIGH (ref 6–23)
CO2: 26 mEq/L (ref 19–32)
Chloride: 97 mEq/L (ref 96–112)
Glucose, Bld: 186 mg/dL — ABNORMAL HIGH (ref 70–99)
Potassium: 3.8 mEq/L (ref 3.5–5.1)

## 2011-06-15 LAB — DIFFERENTIAL
Lymphocytes Relative: 21 % (ref 12–46)
Lymphs Abs: 1.6 10*3/uL (ref 0.7–4.0)
Monocytes Relative: 10 % (ref 3–12)
Neutro Abs: 5.2 10*3/uL (ref 1.7–7.7)
Neutrophils Relative %: 69 % (ref 43–77)

## 2011-06-15 LAB — CBC
Hemoglobin: 10.4 g/dL — ABNORMAL LOW (ref 12.0–15.0)
MCH: 28.9 pg (ref 26.0–34.0)
RBC: 3.6 MIL/uL — ABNORMAL LOW (ref 3.87–5.11)
WBC: 7.6 10*3/uL (ref 4.0–10.5)

## 2011-06-15 MED ORDER — PENTAFLUOROPROP-TETRAFLUOROETH EX AERO: 1.0000 "application " | INHALATION_SPRAY | CUTANEOUS | Status: DC | PRN

## 2011-06-15 MED ORDER — LIDOCAINE-PRILOCAINE 2.5-2.5 % EX CREA: 1.0000 "application " | TOPICAL_CREAM | CUTANEOUS | Status: DC | PRN

## 2011-06-15 MED ORDER — PARICALCITOL 5 MCG/ML IV SOLN
INTRAVENOUS | Status: AC
  Administered 2011-06-15: 1 ug via INTRAVENOUS
  Filled 2011-06-15: qty 1

## 2011-06-15 MED ORDER — ALTEPLASE 2 MG IJ SOLR: 2.0000 mg | Freq: Once | INTRAMUSCULAR | Status: DC | PRN

## 2011-06-15 MED ORDER — NEPRO/CARBSTEADY PO LIQD: 237.0000 mL | ORAL | Status: DC | PRN

## 2011-06-15 MED ORDER — SODIUM CHLORIDE 0.9 % IV SOLN: 100.0000 mL | INTRAVENOUS | Status: DC | PRN

## 2011-06-15 MED ORDER — PARICALCITOL 5 MCG/ML IV SOLN
1.0000 ug | Freq: Once | INTRAVENOUS | Status: AC
  Administered 2011-06-15: 1 ug via INTRAVENOUS

## 2011-06-15 MED ORDER — HEPARIN SODIUM (PORCINE) 1000 UNIT/ML DIALYSIS: 1000.0000 [IU] | INTRAMUSCULAR | Status: DC | PRN

## 2011-06-15 MED ORDER — HEPARIN SODIUM (PORCINE) 1000 UNIT/ML DIALYSIS: 20.0000 [IU]/kg | INTRAMUSCULAR | Status: DC | PRN

## 2011-06-15 MED ORDER — HEPARIN SODIUM (PORCINE) 1000 UNIT/ML DIALYSIS: 100.0000 [IU]/kg | INTRAMUSCULAR | Status: DC | PRN

## 2011-06-15 MED ORDER — LIDOCAINE HCL (PF) 1 % IJ SOLN: 5.0000 mL | INTRAMUSCULAR | Status: DC | PRN

## 2011-06-15 NOTE — ED Notes (Signed)
Pt here for routine hemodialysis treatment

## 2011-06-15 NOTE — ED Notes (Signed)
Pt is here for her routine dialysis. Denies sob or chest pain. Will have secretary call dr frazier. Pt brought to room and given controller. No complaints.

## 2011-06-15 NOTE — ED Provider Notes (Signed)
Screening exam:  Pt in nad, afvss, nontoxic appearing, heart sounds nl, lungs ctab.  Dr Bascom Levels has already been to see pt and planning on HD.  Elijio Miles, MD 06/15/11 [phone]

## 2011-06-15 NOTE — Progress Notes (Unsigned)
Patient ID: Alexandra [name], female   DOB: [birth year], 66 y.o.   MRN: [id number]

## 2011-06-15 NOTE — ED Notes (Signed)
Dr Frazier at bedside.  

## 2011-06-15 NOTE — H&P (Signed)
Hospital Admission Note Date: 06/15/2011  PCP: [name], MD, MD  Chief Complaint:Needs dialysis.   History of Present Illness: 66 year-old female with known history of ESRD on hemodialysis, diabetes mellitus type 2, hypertension has come for routine dialysis to the hospital. Dr Marya Landry evaluated patient and placed  dialysis order. Patient relates feeling well at baseline. No concern or complaints. She was having back pain. She saw her PCP and was started on flexeril. Her back pain has resolved.     Allergies: Codeine; Epinephrine; and Percocet Past Medical History  Diagnosis Date  . Chronic kidney disease     esrd  . Diabetes mellitus   . Hyperlipidemia   . Hypertension   . Renal disorder   . Dialysis care     tues, thurs, sat  . Gout due to renal impairment    Prior to Admission medications   Medication Sig Start Date End Date Taking? Authorizing Provider  acetaminophen (TYLENOL) 500 MG tablet Take 500 mg by mouth every 6 (six) hours as needed. For pain   Yes Historical Provider, MD  allopurinol (ZYLOPRIM) 100 MG tablet Take 100 mg by mouth daily.     Yes Historical Provider, MD  amLODipine (NORVASC) 5 MG tablet Take 5 mg by mouth at bedtime.    Yes Historical Provider, MD  calcium acetate (PHOSLO) 667 MG capsule Take 1,334 mg by mouth 3 (three) times daily with meals.    Yes Historical Provider, MD  Coenzyme Q10 150 MG CAPS Take 300 mg by mouth daily.    Yes Historical Provider, MD  colchicine 0.6 MG tablet Take 0.6 mg by mouth daily as needed. For gout.   Yes Historical Provider, MD  cyclobenzaprine (FLEXERIL) 10 MG tablet Take 10 mg by mouth 2 (two) times daily as needed. For muscle spasms   Yes Historical Provider, MD  docusate sodium (COLACE) 100 MG capsule Take 200 mg by mouth at bedtime.     Yes Historical Provider, MD  hydrALAZINE (APRESOLINE) 25 MG tablet Take 50 mg by mouth 3 (three) times daily.    Yes Historical Provider, MD  l-methylfolate-B6-B12 (METANX)  3-35-2 MG TABS Take 1 tablet by mouth 2 (two) times daily.    Yes Historical Provider, MD  losartan (COZAAR) 50 MG tablet Take 50 mg by mouth at bedtime.    Yes Historical Provider, MD  metoprolol (TOPROL-XL) 50 MG 24 hr tablet Take 50 mg by mouth at bedtime.    Yes Historical Provider, MD  multivitamin (RENA-VIT) TABS tablet Take 1 tablet by mouth daily.     Yes Historical Provider, MD  OVER THE COUNTER MEDICATION Take 60-90 mLs by mouth daily. OTC. QiNopal   Yes Historical Provider, MD  repaglinide (PRANDIN) 0.5 MG tablet Take 0.5 mg by mouth 3 (three) times daily before meals.     Yes Historical Provider, MD   Past Surgical History  Procedure Date  . Carotid endarterectomy 2002    left  . Btl   . Rectal fissurectomy   . Av fistula placement 10/30/2010    right brachiocephalic   . Fistulogram    Family History  Problem Relation Age of Onset  . COPD Mother   . Kidney disease Mother   . Heart disease Father   . Lymphoma Son    History   Social History  . Marital Status: Single    Spouse Name: N/A    Number of Children: N/A  . Years of Education: N/A   Occupational History  .  Not on file.   Social History Main Topics  . Smoking status: Current Everyday Smoker -- 1.0 packs/day for 50 years    Types: Cigarettes  . Smokeless tobacco: Never Used  . Alcohol Use: No  . Drug Use: No  . Sexually Active: Not on file   Other Topics Concern  . Not on file   Social History Narrative  . No narrative on file    REVIEW OF SYSTEMS:  Constitutional:  No weight loss, night sweats, Fevers, chills, fatigue.  HEENT:  No headaches, Difficulty swallowing,Tooth/dental problems,Sore throat,  No sneezing, itching, ear ache, nasal congestion, post nasal drip,  Cardio-vascular:  No chest pain, Orthopnea, PND, swelling in lower extremities, anasarca, dizziness, palpitations  GI:  No heartburn, indigestion, abdominal pain, nausea, vomiting, diarrhea, change in bowel habits, loss of  appetite  Resp:  No shortness of breath with exertion or at rest. No excess mucus, no productive cough, No non-productive cough, No coughing up of blood.No change in color of mucus.No wheezing.No chest wall deformity  Skin:  no rash or lesions.  GU:  no dysuria, change in color of urine, no urgency or frequency. No flank pain.  Musculoskeletal:  No joint pain or swelling. No decreased range of motion. No back pain.  Psych:  No change in mood or affect. No depression or anxiety. No memory loss.   Physical Exam: Filed Vitals:   06/15/11 1537  BP: 166/74  Pulse: 85  Temp: 98.8 F (37.1 C)  TempSrc: Oral  Resp: 20  SpO2: 97%   No intake or output data in the 24 hours ending 06/15/11 1625 BP 166/74  Pulse 85  Temp(Src) 98.8 F (37.1 C) (Oral)  Resp 20  SpO2 97%  General Appearance:    Alert, cooperative, no distress, appears stated age  Head:    Normocephalic, without obvious abnormality, atraumatic  Eyes:    PERRL, conjunctiva/corneas clear, EOM's intact,      Ears:    Normal TM's and external ear canals, both ears  Nose:   Nares normal, septum midline, mucosa normal, no drainage    or sinus tenderness  Throat:   Lips, mucosa, and tongue normal; teeth and gums normal  Neck:   Supple, symmetrical, trachea midline, no adenopathy;    thyroid:  no enlargement/tenderness/nodules; no carotid   bruit or JVD     Lungs:     Clear to auscultation bilaterally, respirations unlabored  Chest Wall:    No tenderness or deformity   Heart:    Regular rate and rhythm, S1 and S2 normal, no murmur, rub   or gallop     Abdomen:     Soft, non-tender, bowel sounds active all four quadrants,    no masses, no organomegaly        Extremities:   Extremities normal, atraumatic, no cyanosis or edema  Pulses:   2+ and symmetric all extremities  Skin:   Skin color, texture, turgor normal, no rashes or lesions     Neurologic:   CNII-XII intact, normal strength, sensation and reflexes    throughout    Lab results:  Basename 06/12/11 1743  NA 137  K 3.2*  CL 96  CO2 28  GLUCOSE 159*  BUN 19  CREATININE 3.74*3.74*  CALCIUM 9.5  MG --  PHOS 1.2*    Basename 06/12/11 1743  AST --  ALT --  ALKPHOS --  BILITOT --  PROT --  ALBUMIN 3.3*    Basename 06/12/11 1743  WBC 7.3  NEUTROABS --  HGB 11.1*  HCT 34.4*  MCV 86.6  PLT 256     Patient Active Hospital Problem List:  End stage renal disease on dialysis (05/08/2011) Patient admitted for dialysis. Dr Ola Spurr consulted.   DM (diabetes mellitus) (02/23/2011) Continue with home medications.    Team 8.    Heith Haigler M.D. [facility] [phone] 06/15/2011, 4:25 PM

## 2011-06-15 NOTE — ED Notes (Signed)
Ordered dinner tray @ 15:58

## 2011-06-17 ENCOUNTER — Encounter (HOSPITAL_COMMUNITY): Payer: Self-pay | Admitting: Emergency Medicine

## 2011-06-17 ENCOUNTER — Emergency Department (HOSPITAL_COMMUNITY)
Admission: EM | Admit: 2011-06-17 | Discharge: 2011-06-17 | Disposition: A | Discharge status: Home / Self Care | Payer: Medicare HMO | Attending: Emergency Medicine | Admitting: Emergency Medicine

## 2011-06-17 ENCOUNTER — Emergency Department (HOSPITAL_COMMUNITY): Admit: 2011-06-17 | Discharge: 2011-06-17 | Disposition: A | Discharge status: Home / Self Care | Payer: Medicare HMO

## 2011-06-17 DIAGNOSIS — E119 Type 2 diabetes mellitus without complications: Secondary | ICD-10-CM | POA: Insufficient documentation

## 2011-06-17 DIAGNOSIS — M109 Gout, unspecified: Secondary | ICD-10-CM | POA: Insufficient documentation

## 2011-06-17 DIAGNOSIS — N186 End stage renal disease: Secondary | ICD-10-CM | POA: Insufficient documentation

## 2011-06-17 DIAGNOSIS — Z992 Dependence on renal dialysis: Secondary | ICD-10-CM | POA: Insufficient documentation

## 2011-06-17 DIAGNOSIS — E785 Hyperlipidemia, unspecified: Secondary | ICD-10-CM | POA: Insufficient documentation

## 2011-06-17 DIAGNOSIS — I120 Hypertensive chronic kidney disease with stage 5 chronic kidney disease or end stage renal disease: Secondary | ICD-10-CM | POA: Insufficient documentation

## 2011-06-17 LAB — CBC
HCT: 34.3 % — ABNORMAL LOW (ref 36.0–46.0)
MCHC: 31.8 g/dL (ref 30.0–36.0)
RDW: 19.9 % — ABNORMAL HIGH (ref 11.5–15.5)

## 2011-06-17 LAB — DIFFERENTIAL
Basophils Absolute: 0 10*3/uL (ref 0.0–0.1)
Basophils Relative: 0 % (ref 0–1)
Eosinophils Relative: 1 % (ref 0–5)
Monocytes Absolute: 0.9 10*3/uL (ref 0.1–1.0)
Neutro Abs: 5 10*3/uL (ref 1.7–7.7)

## 2011-06-17 LAB — RENAL FUNCTION PANEL
Albumin: 3.3 g/dL — ABNORMAL LOW (ref 3.5–5.2)
Calcium: 9.9 mg/dL (ref 8.4–10.5)
Chloride: 98 mEq/L (ref 96–112)
Creatinine, Ser: 7.74 mg/dL — ABNORMAL HIGH (ref 0.50–1.10)
GFR calc non Af Amer: 5 mL/min — ABNORMAL LOW (ref >90)

## 2011-06-17 MED ORDER — PARICALCITOL 5 MCG/ML IV SOLN
1.0000 ug | Freq: Once | INTRAVENOUS | Status: AC
  Administered 2011-06-17: 1 ug via INTRAVENOUS

## 2011-06-17 MED ORDER — PARICALCITOL 5 MCG/ML IV SOLN
INTRAVENOUS | Status: AC
  Filled 2011-06-17: qty 1

## 2011-06-17 NOTE — ED Notes (Signed)
Patients vitals signs reported to the hospitalist . Md will see pt in dialysis for admission. Pt transported to dialysis

## 2011-06-17 NOTE — ED Notes (Signed)
Pt presents for hemodialysis, last received on Tuesday.

## 2011-06-17 NOTE — ED Provider Notes (Signed)
I saw and evaluated the patient, reviewed the resident's note and I agree with the findings and plan.   .Face to face Exam:  General:  Awake HEENT:  Atraumatic Resp:  Normal effort Abd:  Nondistended Neuro:No focal weakness Lymph: No adenopathy   Nelia Shi, MD 06/17/11 1105

## 2011-06-19 ENCOUNTER — Emergency Department (HOSPITAL_COMMUNITY)
Admission: EM | Admit: 2011-06-19 | Discharge: 2011-06-20 | Disposition: A | Discharge status: Home / Self Care | Payer: Medicare HMO | Attending: Nephrology | Admitting: Nephrology

## 2011-06-19 ENCOUNTER — Encounter (HOSPITAL_COMMUNITY): Payer: Self-pay | Admitting: *Deleted

## 2011-06-19 DIAGNOSIS — N186 End stage renal disease: Secondary | ICD-10-CM | POA: Insufficient documentation

## 2011-06-19 DIAGNOSIS — Z992 Dependence on renal dialysis: Secondary | ICD-10-CM | POA: Insufficient documentation

## 2011-06-19 MED ORDER — HEPARIN SODIUM (PORCINE) 1000 UNIT/ML DIALYSIS: 20.0000 [IU]/kg | INTRAMUSCULAR | Status: DC | PRN

## 2011-06-19 NOTE — ED Provider Notes (Signed)
Medical screening exam: 66 year old female came to the ED for hemodialysis. Her nephrologist has been in to see her and arranged for the dialysis. She is resting comfortably and in no acute distress.  Dione Booze, MD 06/19/11 [phone]

## 2011-06-19 NOTE — Consult Note (Signed)
Pt. In ER some CHF  Orders  For dialysis written.

## 2011-06-19 NOTE — ED Notes (Signed)
Pt. Was sent here by [name] for dialysis.

## 2011-06-20 ENCOUNTER — Emergency Department (HOSPITAL_COMMUNITY): Payer: Medicare HMO

## 2011-06-20 LAB — DIFFERENTIAL
Basophils Absolute: 0 10*3/uL (ref 0.0–0.1)
Basophils Relative: 0 % (ref 0–1)
Eosinophils Relative: 2 % (ref 0–5)
Monocytes Absolute: 0.8 10*3/uL (ref 0.1–1.0)

## 2011-06-20 LAB — CBC
HCT: 34.1 % — ABNORMAL LOW (ref 36.0–46.0)
MCH: 27.8 pg (ref 26.0–34.0)
MCHC: 32.6 g/dL (ref 30.0–36.0)
MCV: 85.3 fL (ref 78.0–100.0)
RDW: 19.7 % — ABNORMAL HIGH (ref 11.5–15.5)

## 2011-06-20 LAB — RENAL FUNCTION PANEL
Albumin: 3.3 g/dL — ABNORMAL LOW (ref 3.5–5.2)
Calcium: 9.9 mg/dL (ref 8.4–10.5)
Creatinine, Ser: 7.77 mg/dL — ABNORMAL HIGH (ref 0.50–1.10)
GFR calc non Af Amer: 5 mL/min — ABNORMAL LOW (ref >90)

## 2011-06-20 MED ORDER — ALBUTEROL SULFATE (5 MG/ML) 0.5% IN NEBU: 2.5000 mg | INHALATION_SOLUTION | RESPIRATORY_TRACT | Status: DC | PRN

## 2011-06-20 MED ORDER — PARICALCITOL 5 MCG/ML IV SOLN
1.0000 ug | INTRAVENOUS | Status: DC
  Administered 2011-06-20: 1 ug via INTRAVENOUS
  Filled 2011-06-20: qty 0.2

## 2011-06-20 MED ORDER — ACETAMINOPHEN 325 MG PO TABS: 650.0000 mg | ORAL_TABLET | Freq: Four times a day (QID) | ORAL | Status: DC | PRN

## 2011-06-20 MED ORDER — PARICALCITOL 5 MCG/ML IV SOLN: 1.0000 ug | INTRAVENOUS | Status: DC

## 2011-06-20 MED ORDER — DOCUSATE SODIUM 100 MG PO CAPS: 100.0000 mg | ORAL_CAPSULE | Freq: Two times a day (BID) | ORAL | Status: DC

## 2011-06-20 MED ORDER — DARBEPOETIN ALFA-POLYSORBATE 100 MCG/0.5ML IJ SOLN
INTRAMUSCULAR | Status: AC
  Administered 2011-06-20: 100 ug via INTRAVENOUS
  Filled 2011-06-20: qty 0.5

## 2011-06-20 MED ORDER — PARICALCITOL 5 MCG/ML IV SOLN
INTRAVENOUS | Status: AC
  Administered 2011-06-20: 1 ug via INTRAVENOUS
  Filled 2011-06-20: qty 1

## 2011-06-20 MED ORDER — ONDANSETRON HCL 4 MG/2ML IJ SOLN: 4.0000 mg | Freq: Four times a day (QID) | INTRAMUSCULAR | Status: DC | PRN

## 2011-06-20 MED ORDER — ACETAMINOPHEN 650 MG RE SUPP: 650.0000 mg | Freq: Four times a day (QID) | RECTAL | Status: DC | PRN

## 2011-06-20 MED ORDER — GUAIFENESIN-DM 100-10 MG/5ML PO SYRP: 5.0000 mL | ORAL_SOLUTION | ORAL | Status: DC | PRN

## 2011-06-20 MED ORDER — ALUM & MAG HYDROXIDE-SIMETH 200-200-20 MG/5ML PO SUSP
30.0000 mL | Freq: Four times a day (QID) | ORAL | Status: DC | PRN
Start: 2011-06-20 — End: 2011-06-20

## 2011-06-20 MED ORDER — ONDANSETRON HCL 8 MG PO TABS: 4.0000 mg | ORAL_TABLET | Freq: Four times a day (QID) | ORAL | Status: DC | PRN

## 2011-06-20 MED ORDER — DARBEPOETIN ALFA-POLYSORBATE 100 MCG/0.5ML IJ SOLN
100.0000 ug | INTRAMUSCULAR | Status: DC
  Administered 2011-06-20: 100 ug via INTRAVENOUS
  Filled 2011-06-20: qty 0.5

## 2011-06-20 NOTE — Discharge Summary (Signed)
Physician Discharge Summary  Patient ID: Alexandra [name] MRN: [id number] DOB/AGE: 1945-04-23 66 y.o.  Admit date: 06/19/2011 Discharge date: 06/20/2011  Admission Diagnoses:  End stage renal disease  Discharge Diagnoses: End stage renal disease Active Problems:  HTN (hypertension)  DM (diabetes mellitus)  ESRD (end stage renal disease) on dialysis   Discharged Condition: good  Hospital Course: Alexandra [name] is a 66 years old African American female with history of end-stage renal disease, type 2 diabetes mellitus, hypertension, hyperlipidemia, gout, ongoing tobacco abuse was admitted for hemodialysis. Currently she has no arrangements for outpatient dialysis and hence comes to the hospital 3 times per week on Tuesday, Thursday and Saturday. She underwent hemodialysis without difficulty and was discharge home in stable condition.      Consults: none  Significant Diagnostic Studies: none-  Treatments: hemodialysis Discharge Exam: Blood pressure 149/77, pulse 100, temperature 97.6 F (36.4 C), temperature source Oral, resp. rate 20, weight 59.8 kg (131 lb 13.4 oz), SpO2 92.00%.   Disposition: 01-Home or Self Care   Medication List  As of 06/20/2011  5:17 AM   TAKE these medications         acetaminophen 500 MG tablet   Commonly known as: TYLENOL   Take 500 mg by mouth every 6 (six) hours as needed. For pain      allopurinol 100 MG tablet   Commonly known as: ZYLOPRIM   Take 100 mg by mouth daily.      amLODipine 5 MG tablet   Commonly known as: NORVASC   Take 5 mg by mouth at bedtime.      calcium acetate 667 MG capsule   Commonly known as: PHOSLO   Take 1,334 mg by mouth 3 (three) times daily with meals.      Coenzyme Q10 150 MG Caps   Take 300 mg by mouth daily.      colchicine 0.6 MG tablet   Take 0.6 mg by mouth daily as needed. For gout.      cyclobenzaprine 10 MG tablet   Commonly known as: FLEXERIL   Take 10 mg by mouth 2 (two) times daily as  needed. For muscle spasms      docusate sodium 100 MG capsule   Commonly known as: COLACE   Take 200 mg by mouth at bedtime.      hydrALAZINE 25 MG tablet   Commonly known as: APRESOLINE   Take 50 mg by mouth 3 (three) times daily.      l-methylfolate-B6-B12 3-35-2 MG Tabs   Commonly known as: METANX   Take 1 tablet by mouth 2 (two) times daily.      losartan 50 MG tablet   Commonly known as: COZAAR   Take 50 mg by mouth at bedtime.      metoprolol succinate 50 MG 24 hr tablet   Commonly known as: TOPROL-XL   Take 50 mg by mouth at bedtime.      multivitamin Tabs tablet   Take 1 tablet by mouth daily.      OVER THE COUNTER MEDICATION   Take 60-90 mLs by mouth daily. OTC. QiNopal      repaglinide 0.5 MG tablet   Commonly known as: PRANDIN   Take 0.5 mg by mouth 3 (three) times daily before meals.             SignedBurnadette Peter, Neri Vieyra A. 06/20/2011, 5:17 AM

## 2011-06-20 NOTE — H&P (Signed)
PCP:   [name], MD, MD    Chief Complaint:   Here for HD  HPI: Alexandra [name] is a 66 y.o. female   has a past medical history of Chronic kidney disease; Diabetes mellitus; Hyperlipidemia; Hypertension; Renal disorder; Dialysis care; and Gout due to renal impairment.   Presented with  Needs HD, no complaints  Review of Systems:  negative  Past Medical History: Past Medical History  Diagnosis Date  . Chronic kidney disease     esrd  . Diabetes mellitus   . Hyperlipidemia   . Hypertension   . Renal disorder   . Dialysis care     tues, thurs, sat  . Gout due to renal impairment    Past Surgical History  Procedure Date  . Carotid endarterectomy 2002    left  . Btl   . Rectal fissurectomy   . Av fistula placement 10/30/2010    right brachiocephalic   . Fistulogram      Medications: Prior to Admission medications   Medication Sig Start Date End Date Taking? Authorizing Provider  acetaminophen (TYLENOL) 500 MG tablet Take 500 mg by mouth every 6 (six) hours as needed. For pain   Yes Historical Provider, MD  allopurinol (ZYLOPRIM) 100 MG tablet Take 100 mg by mouth daily.     Yes Historical Provider, MD  amLODipine (NORVASC) 5 MG tablet Take 5 mg by mouth at bedtime.    Yes Historical Provider, MD  calcium acetate (PHOSLO) 667 MG capsule Take 1,334 mg by mouth 3 (three) times daily with meals.    Yes Historical Provider, MD  Coenzyme Q10 150 MG CAPS Take 300 mg by mouth daily.    Yes Historical Provider, MD  colchicine 0.6 MG tablet Take 0.6 mg by mouth daily as needed. For gout.   Yes Historical Provider, MD  cyclobenzaprine (FLEXERIL) 10 MG tablet Take 10 mg by mouth 2 (two) times daily as needed. For muscle spasms   Yes Historical Provider, MD  docusate sodium (COLACE) 100 MG capsule Take 200 mg by mouth at bedtime.     Yes Historical Provider, MD  hydrALAZINE (APRESOLINE) 25 MG tablet Take 50 mg by mouth 3 (three) times daily.    Yes Historical  Provider, MD  l-methylfolate-B6-B12 (METANX) 3-35-2 MG TABS Take 1 tablet by mouth 2 (two) times daily.    Yes Historical Provider, MD  losartan (COZAAR) 50 MG tablet Take 50 mg by mouth at bedtime.    Yes Historical Provider, MD  metoprolol (TOPROL-XL) 50 MG 24 hr tablet Take 50 mg by mouth at bedtime.    Yes Historical Provider, MD  OVER THE COUNTER MEDICATION Take 60-90 mLs by mouth daily. OTC. QiNopal   Yes Historical Provider, MD  repaglinide (PRANDIN) 0.5 MG tablet Take 0.5 mg by mouth 3 (three) times daily before meals.     Yes Historical Provider, MD  multivitamin (RENA-VIT) TABS tablet Take 1 tablet by mouth daily.      Historical Provider, MD    Allergies:   Allergies  Allergen Reactions  . Codeine Other (See Comments)    Passes out  . Epinephrine Other (See Comments)    Tachycardia, diaphoresis, syncope  . Percocet (Oxycodone-Acetaminophen) Other (See Comments)    Passes  out    Social History:     reports that she has been smoking Cigarettes.  She has a 50 pack-year smoking history. She has never used smokeless tobacco. She reports that she does not drink alcohol or use  illicit drugs.   Family History: family history includes COPD in her mother; Heart disease in her father; Kidney disease in her mother; and Lymphoma in her son.    Physical Exam: Patient Vitals for the past 24 hrs:  BP Temp Temp src Pulse Resp SpO2 Weight  06/20/11 0330 191/74 mmHg - - 92  20  - -  06/20/11 0300 189/80 mmHg - - 90  20  - -  06/20/11 0230 163/116 mmHg - - 89  20  - -  06/20/11 0200 201/77 mmHg - - 89  20  - -  06/20/11 0130 193/87 mmHg - - 92  20  - -  06/20/11 0118 200/83 mmHg - - 94  20  - -  06/20/11 0113 191/73 mmHg - - 93  20  - -  06/20/11 0055 183/71 mmHg 97.6 F (36.4 C) Oral 91  20  92 % 59.8 kg (131 lb 13.4 oz)  06/19/11 1943 189/71 mmHg - - 74  20  95 % -  06/19/11 1735 186/71 mmHg 98.1 F (36.7 C) Oral 87  20  100 % -    1. General:  in No Acute distress 2.  Psychological: Alert and Oriented 3. Head/ENT:   MoistMucous Membranes                          Head Non traumatic, neck supple                          Normal  Dentition 4. SKIN: normal  Skin turgor,  Skin clean Dry and intact no rash 5. Heart: Regular rate and rhythm no Murmur, Rub or gallop 6. Lungs: Clear to auscultation bilaterally, no wheezes or crackles   7. Abdomen: Soft, non-tender, Non distended 8. Lower extremities: no clubbing, cyanosis, or edema 9. Neurologically Grossly intact, moving all 4 extremities equally 10. MSK: Normal range of motion  body mass index is unknown because there is no height on file.   Labs on Admission:   [facility] 06/20/11 0138 06/17/11 1635  NA 137 139  K 3.9 4.2  CL 96 98  CO2 27 26  GLUCOSE 108* 103*  BUN 42* 40*  CREATININE 7.77* 7.74*  CALCIUM 9.9 9.9  MG -- --  PHOS 2.4 2.6    Basename 06/20/11 0138 06/17/11 1635  AST -- --  ALT -- --  ALKPHOS -- --  BILITOT -- --  PROT -- --  ALBUMIN 3.3* 3.3*   No results found for this basename: LIPASE:2,AMYLASE:2 in the last 72 hours  Basename 06/20/11 0138 06/17/11 1635  WBC 7.2 8.0  NEUTROABS 4.0 5.0  HGB 11.1* 10.9*  HCT 34.1* 34.3*  MCV 85.3 86.8  PLT 264 299   No results found for this basename: CKTOTAL:3,CKMB:3,CKMBINDEX:3,TROPONINI:3 in the last 72 hours No results found for this basename: TSH,T4TOTAL,FREET3,T3FREE,THYROIDAB in the last 72 hours No results found for this basename: VITAMINB12:2,FOLATE:2,FERRITIN:2,TIBC:2,IRON:2,RETICCTPCT:2 in the last 72 hours Lab Results  Component Value Date   HGBA1C 5.4 05/16/2011    The CrCl is unknown because both a height and weight (above a minimum accepted value) are required for this calculation. ABG    Component Value Date/Time   TCO2 29 06/10/2010 0739     No results found for this basename: DDIMER       Cultures:    Component Value Date/Time   SDES URINE, CLEAN [facility] 08/20/2009 2114   SPECREQUEST  NONE 08/20/2009 2114    CULT NO GROWTH 08/20/2009 2114   REPTSTATUS 08/22/2009 FINAL 08/20/2009 2114       Radiological Exams on Admission: No results found.  Assessment/Plan  66 yo F with ESRD Needs HD  Present on Admission:  .HTN (hypertension) .DM (diabetes mellitus) .ESRD (end stage renal disease) on dialysis   Prophylaxis: SCD     I have spent a total of 30 min on this admition  Neola Worrall 06/20/2011, 3:45 AM

## 2011-06-20 NOTE — ED Notes (Signed)
[facility] called states they are ready for patient

## 2011-06-22 ENCOUNTER — Encounter (HOSPITAL_COMMUNITY): Payer: Self-pay | Admitting: *Deleted

## 2011-06-22 ENCOUNTER — Observation Stay (HOSPITAL_COMMUNITY)
Admission: EM | Admit: 2011-06-22 | Discharge: 2011-06-23 | Disposition: A | Discharge status: Home / Self Care | Payer: Medicare HMO | Attending: Internal Medicine | Admitting: Internal Medicine

## 2011-06-22 DIAGNOSIS — D638 Anemia in other chronic diseases classified elsewhere: Secondary | ICD-10-CM | POA: Diagnosis present

## 2011-06-22 DIAGNOSIS — Z992 Dependence on renal dialysis: Principal | ICD-10-CM

## 2011-06-22 DIAGNOSIS — N2581 Secondary hyperparathyroidism of renal origin: Secondary | ICD-10-CM | POA: Diagnosis present

## 2011-06-22 DIAGNOSIS — N186 End stage renal disease: Secondary | ICD-10-CM | POA: Diagnosis present

## 2011-06-22 DIAGNOSIS — E119 Type 2 diabetes mellitus without complications: Secondary | ICD-10-CM | POA: Diagnosis present

## 2011-06-22 DIAGNOSIS — I120 Hypertensive chronic kidney disease with stage 5 chronic kidney disease or end stage renal disease: Secondary | ICD-10-CM | POA: Insufficient documentation

## 2011-06-22 DIAGNOSIS — I10 Essential (primary) hypertension: Secondary | ICD-10-CM | POA: Diagnosis present

## 2011-06-22 DIAGNOSIS — Z8739 Personal history of other diseases of the musculoskeletal system and connective tissue: Secondary | ICD-10-CM

## 2011-06-22 DIAGNOSIS — E785 Hyperlipidemia, unspecified: Secondary | ICD-10-CM | POA: Diagnosis present

## 2011-06-22 LAB — GLUCOSE, CAPILLARY
Glucose-Capillary: 158 mg/dL — ABNORMAL HIGH (ref 70–99)
Glucose-Capillary: 158 mg/dL — ABNORMAL HIGH (ref 70–99)

## 2011-06-22 MED ORDER — HEPARIN SODIUM (PORCINE) 1000 UNIT/ML DIALYSIS: 20.0000 [IU]/kg | INTRAMUSCULAR | Status: DC | PRN

## 2011-06-22 NOTE — ED Notes (Signed)
Dinner tray ordered.Renal diet 

## 2011-06-22 NOTE — Consult Note (Signed)
Pt. In ER . Dialysis orders written.  

## 2011-06-22 NOTE — ED Notes (Signed)
Patient is here for dialysis.  She has noted hyperglycemia.  Denies any sob.  Last tx was saturday

## 2011-06-22 NOTE — ED Notes (Signed)
DrFrazier is on the way.

## 2011-06-23 ENCOUNTER — Encounter (HOSPITAL_COMMUNITY): Payer: Self-pay | Admitting: Family Medicine

## 2011-06-23 ENCOUNTER — Emergency Department (HOSPITAL_COMMUNITY): Payer: Medicare HMO

## 2011-06-23 DIAGNOSIS — Z8739 Personal history of other diseases of the musculoskeletal system and connective tissue: Secondary | ICD-10-CM

## 2011-06-23 DIAGNOSIS — Z992 Dependence on renal dialysis: Secondary | ICD-10-CM

## 2011-06-23 LAB — RENAL FUNCTION PANEL
Albumin: 3.3 g/dL — ABNORMAL LOW (ref 3.5–5.2)
BUN: 52 mg/dL — ABNORMAL HIGH (ref 6–23)
Chloride: 95 mEq/L — ABNORMAL LOW (ref 96–112)
GFR calc Af Amer: 4 mL/min — ABNORMAL LOW (ref >90)
GFR calc non Af Amer: 4 mL/min — ABNORMAL LOW (ref >90)
Phosphorus: 2.6 mg/dL (ref 2.3–4.6)
Potassium: 4.5 mEq/L (ref 3.5–5.1)
Sodium: 135 mEq/L (ref 135–145)

## 2011-06-23 LAB — CBC
Hemoglobin: 10.6 g/dL — ABNORMAL LOW (ref 12.0–15.0)
MCH: 27.2 pg (ref 26.0–34.0)
MCHC: 32.2 g/dL (ref 30.0–36.0)
Platelets: 265 10*3/uL (ref 150–400)

## 2011-06-23 LAB — DIFFERENTIAL
Basophils Absolute: 0 10*3/uL (ref 0.0–0.1)
Basophils Relative: 0 % (ref 0–1)
Eosinophils Absolute: 0.1 10*3/uL (ref 0.0–0.7)
Monocytes Relative: 12 % (ref 3–12)
Neutro Abs: 5 10*3/uL (ref 1.7–7.7)
Neutrophils Relative %: 62 % (ref 43–77)

## 2011-06-23 LAB — GLUCOSE, CAPILLARY: Glucose-Capillary: 89 mg/dL (ref 70–99)

## 2011-06-23 NOTE — ED Notes (Signed)
MD at bedside for eval. Pt states "there's nothing wrong with me. i'm just here for my dialysis"

## 2011-06-23 NOTE — Progress Notes (Signed)
Pt became verbally aggressive w/ this nurse and tech b/c we would not heat up water in a basin for her to take a bath before she left HD unit.  We explained that this unit is not set up for baths, we could provide her w/ soap and washcloth for bird bath but that was not sufficient.  We got confirmation from care coordinator on [id number] and was confirmed that it is against policy to heat water in microwave , had to call house supervisor up to unit to to reinforce policy as she will not take anything this nurse says as factual.

## 2011-06-23 NOTE — Progress Notes (Signed)
Pt came to HD unit from ED fussing at staff and complaining and once again threatening staff with calling Medicare because she had to wait so long for tx.  Pt began asking was the tx that I ran before her emergent b/c medicare will want to know if she was pushed back for emergent or non-emergent case.  I directed pt that I was not at liberty to discuss the schedule with her and if she had further questions she would need to take them to our unit director for clarification.

## 2011-06-23 NOTE — ED Notes (Signed)
Patient refused to wear portable monitor to dialysis unit

## 2011-06-23 NOTE — H&P (Signed)
PCP:   [name], MD, MD   Chief Complaint:  Need for hemodialysis  HPI: Patient presents for her usual routine outpatient hemodialysis. She denies any shortness of breath and chills and fevers etc.  Review of Systems:positives bolded   anorexia, fever, weight loss,, vision loss, decreased hearing, hoarseness, chest pain, syncope, dyspnea on exertion, peripheral edema, balance deficits, hemoptysis, abdominal pain, melena, hematochezia, severe indigestion/heartburn, hematuria, incontinence, genital sores, muscle weakness, suspicious skin lesions, transient blindness, difficulty walking, depression, unusual weight change, abnormal bleeding, enlarged lymph nodes, angioedema, and breast masses.  Past Medical History: Past Medical History  Diagnosis Date  . Chronic kidney disease     esrd  . Diabetes mellitus   . Hyperlipidemia   . Hypertension   . Renal disorder   . Dialysis care     tues, thurs, sat  . Gout due to renal impairment    Past Surgical History  Procedure Date  . Carotid endarterectomy 2002    left  . Btl   . Rectal fissurectomy   . Av fistula placement 10/30/2010    right brachiocephalic   . Fistulogram     Medications: Prior to Admission medications   Medication Sig Start Date End Date Taking? Authorizing Provider  acetaminophen (TYLENOL) 500 MG tablet Take 500 mg by mouth every 6 (six) hours as needed. For pain   Yes Historical Provider, MD  allopurinol (ZYLOPRIM) 100 MG tablet Take 100 mg by mouth daily.     Yes Historical Provider, MD  amLODipine (NORVASC) 5 MG tablet Take 5 mg by mouth at bedtime.    Yes Historical Provider, MD  calcium acetate (PHOSLO) 667 MG capsule Take 1,334 mg by mouth 3 (three) times daily with meals.    Yes Historical Provider, MD  Coenzyme Q10 150 MG CAPS Take 300 mg by mouth daily.    Yes Historical Provider, MD  colchicine 0.6 MG tablet Take 0.6 mg by mouth daily as needed. For gout.   Yes Historical Provider, MD    cyclobenzaprine (FLEXERIL) 10 MG tablet Take 10 mg by mouth 2 (two) times daily as needed. For muscle spasms   Yes Historical Provider, MD  docusate sodium (COLACE) 100 MG capsule Take 200 mg by mouth at bedtime.     Yes Historical Provider, MD  hydrALAZINE (APRESOLINE) 25 MG tablet Take 50 mg by mouth 3 (three) times daily.    Yes Historical Provider, MD  l-methylfolate-B6-B12 (METANX) 3-35-2 MG TABS Take 1 tablet by mouth 2 (two) times daily.    Yes Historical Provider, MD  losartan (COZAAR) 50 MG tablet Take 50 mg by mouth at bedtime.    Yes Historical Provider, MD  metoprolol (TOPROL-XL) 50 MG 24 hr tablet Take 50 mg by mouth at bedtime.    Yes Historical Provider, MD  multivitamin (RENA-VIT) TABS tablet Take 1 tablet by mouth daily.     Yes Historical Provider, MD  OVER THE COUNTER MEDICATION Take 60-90 mLs by mouth daily. OTC. QiNopal   Yes Historical Provider, MD  repaglinide (PRANDIN) 0.5 MG tablet Take 0.5 mg by mouth 3 (three) times daily before meals.     Yes Historical Provider, MD    Allergies:   Allergies  Allergen Reactions  . Codeine Other (See Comments)    Passes out  . Epinephrine Other (See Comments)    Tachycardia, diaphoresis, syncope  . Percocet (Oxycodone-Acetaminophen) Other (See Comments)    Passes  out    Social History:  reports that she has been smoking  Cigarettes.  She has a 50 pack-year smoking history. She has never used smokeless tobacco. She reports that she does not drink alcohol or use illicit drugs.  Family History: Family History  Problem Relation Age of Onset  . COPD Mother   . Kidney disease Mother   . Heart disease Father   . Lymphoma Son     Physical Exam: Filed Vitals:   06/22/11 1904 06/22/11 2116  BP: 186/70 154/78  Pulse: 94 88  Temp: 98 F (36.7 C) 97.4 F (36.3 C)  TempSrc: Oral Oral  Resp: 16 20  SpO2: 98% 96%    General:  Alert and oriented times three, well developed and nourished, no acute distress Eyes: PERRLA,  pink conjunctiva, no scleral icterus ENT: Moist oral mucosa, neck supple, no thyromegaly Lungs: clear to ascultation, no wheeze, no crackles, no use of accessory muscles Cardiovascular: regular rate and rhythm, no regurgitation, no gallops, no murmurs. No carotid bruits, no JVD Abdomen: soft, positive BS, non-tender, non-distended, no organomegaly, not an acute abdomen GU: not examined Neuro: CN II - XII grossly intact, sensation intact Musculoskeletal: strength 5/5 all extremities, no clubbing, cyanosis or edema Skin: no rash, no subcutaneous crepitation, no decubitus Psych: appropriate patient   Labs on Admission:   [facility] 06/20/11 0138  NA 137  K 3.9  CL 96  CO2 27  GLUCOSE 108*  BUN 42*  CREATININE 7.77*  CALCIUM 9.9  MG --  PHOS 2.4    Basename 06/20/11 0138  AST --  ALT --  ALKPHOS --  BILITOT --  PROT --  ALBUMIN 3.3*   No results found for this basename: LIPASE:2,AMYLASE:2 in the last 72 hours  Basename 06/20/11 0138  WBC 7.2  NEUTROABS 4.0  HGB 11.1*  HCT 34.1*  MCV 85.3  PLT 264   No results found for this basename: CKTOTAL:3,CKMB:3,CKMBINDEX:3,TROPONINI:3 in the last 72 hours No components found with this basename: POCBNP:3 No results found for this basename: DDIMER:2 in the last 72 hours No results found for this basename: HGBA1C:2 in the last 72 hours No results found for this basename: CHOL:2,HDL:2,LDLCALC:2,TRIG:2,CHOLHDL:2,LDLDIRECT:2 in the last 72 hours No results found for this basename: TSH,T4TOTAL,FREET3,T3FREE,THYROIDAB in the last 72 hours No results found for this basename: VITAMINB12:2,FOLATE:2,FERRITIN:2,TIBC:2,IRON:2,RETICCTPCT:2 in the last 72 hours  Micro Results: No results found for this or any previous visit (from the past 240 hour(s)).   Radiological Exams on Admission: No results found.  Assessment/Plan Present on Admission:  .ESRD (end stage renal disease) on dialysis Patient here for routine outpatient  hemodialysis Nephrology seen the patient during hemodialysis orders .Diabetes mellitus .HTN (hypertension), benign .Hyperlipidemia .Hyperparathyroidism, secondary renal .Anemia of chronic disease Resume home medication  Full code DVT prophylaxis T. May/Dr. Jake Bathe, Keiandra Sullenger 06/23/2011, 12:42 AM

## 2011-06-23 NOTE — ED Provider Notes (Signed)
History     CSN: [id number]  Arrival date & time 06/22/11  4098   First MD Initiated Contact with Patient 06/23/11 0029      Chief Complaint  Patient presents with  . Vascular Access Problem    (Consider location/radiation/quality/duration/timing/severity/associated sxs/prior treatment) The history is provided by the patient. No language interpreter was used.  patient reports that she has no issues only that she is here for her regular dialysis.  Last treatment was Saturday.  Nothing out of the ordinary.  Only wants labs drawn off her port.    Past Medical History  Diagnosis Date  . Chronic kidney disease     esrd  . Diabetes mellitus   . Hyperlipidemia   . Hypertension   . Renal disorder   . Dialysis care     tues, thurs, sat  . 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 Carotid endarterectomy 2002    left  . Btl   . Rectal fissurectomy   . Av fistula placement 10/30/2010    right brachiocephalic   . Fistulogram     Family History  Problem Relation Age of Onset  . COPD Mother   . Kidney disease Mother   . Heart disease Father   . Lymphoma Son     History  Substance Use Topics  . Smoking status: Current Everyday Smoker -- 1.0 packs/day for 50 years    Types: Cigarettes  . Smokeless tobacco: Never Used  . Alcohol Use: No    OB History    Grav Para Term Preterm Abortions TAB SAB Ect Mult Living                  Review of Systems  All other systems reviewed and are negative.    Allergies  Codeine; Epinephrine; and Percocet  Home Medications   Current Outpatient Rx  Name Route Sig Dispense Refill  . ACETAMINOPHEN 500 MG PO TABS Oral Take 500 mg by mouth every 6 (six) hours as needed. For pain    . ALLOPURINOL 100 MG PO TABS Oral Take 100 mg by mouth daily.      Marland Kitchen AMLODIPINE BESYLATE 5 MG PO TABS Oral Take 5 mg by mouth at bedtime.     Marland Kitchen CALCIUM ACETATE 667 MG PO CAPS Oral Take 1,334 mg by mouth 3 (three) times  daily with meals.     Marland Kitchen COENZYME Q10 150 MG PO CAPS Oral Take 300 mg by mouth daily.     . COLCHICINE 0.6 MG PO TABS Oral Take 0.6 mg by mouth daily as needed. For gout.    . CYCLOBENZAPRINE HCL 10 MG PO TABS Oral Take 10 mg by mouth 2 (two) times daily as needed. For muscle spasms    . DOCUSATE SODIUM 100 MG PO CAPS Oral Take 200 mg by mouth at bedtime.      Marland Kitchen HYDRALAZINE HCL 25 MG PO TABS Oral Take 50 mg by mouth 3 (three) times daily.     . L-METHYLFOLATE-B6-B12 3-35-2 MG PO TABS Oral Take 1 tablet by mouth 2 (two) times daily.     Marland Kitchen LOSARTAN POTASSIUM 50 MG PO TABS Oral Take 50 mg by mouth at bedtime.     Marland Kitchen METOPROLOL SUCCINATE ER 50 MG PO TB24 Oral Take 50 mg by mouth at bedtime.     Marland Kitchen RENA-VITE PO TABS Oral Take 1 tablet by mouth daily.      Marland Kitchen OVER  THE COUNTER MEDICATION Oral Take 60-90 mLs by mouth daily. OTC. QiNopal    . REPAGLINIDE 0.5 MG PO TABS Oral Take 0.5 mg by mouth 3 (three) times daily before meals.        BP 154/78  Pulse 88  Temp(Src) 97.4 F (36.3 C) (Oral)  Resp 20  SpO2 96%  Physical Exam  Constitutional: She is oriented to person, place, and time. She appears well-developed and well-nourished.  HENT:  Head: Normocephalic and atraumatic.  Mouth/Throat: Oropharynx is clear and moist.  Eyes: Conjunctivae are normal. Pupils are equal, round, and reactive to light.  Neck: Normal range of motion. Neck supple.  Cardiovascular: Normal rate and regular rhythm.   Pulmonary/Chest: She has decreased breath sounds.  Abdominal: Soft. Bowel sounds are normal. There is no tenderness.  Musculoskeletal: Normal range of motion.  Neurological: She is alert and oriented to person, place, and time.  Skin: Skin is warm and dry. She is not diaphoretic.  Psychiatric: She has a normal mood and affect.    ED Course  Procedures (including critical care time)  Labs Reviewed  GLUCOSE, CAPILLARY - Abnormal; Notable for the following:    Glucose-Capillary 158 (*)    All other  components within normal limits  GLUCOSE, CAPILLARY - Abnormal; Notable for the following:    Glucose-Capillary 158 (*)    All other components within normal limits  CBC  DIFFERENTIAL   No results found.   1. ESRD (end stage renal disease) on dialysis       MDM  Case d/w Dr. Haroldine Laws who will see patient       Kinney Sackmann K Yader Criger-Rasch, MD 06/23/11 [phone]

## 2011-06-23 NOTE — Progress Notes (Signed)
Utilization review completed.  

## 2011-06-24 ENCOUNTER — Inpatient Hospital Stay (HOSPITAL_COMMUNITY)
Admission: EM | Admit: 2011-06-24 | Discharge: 2011-06-25 | DRG: 682 | Disposition: A | Discharge status: Home / Self Care | Payer: Medicare HMO | Attending: Internal Medicine | Admitting: Internal Medicine

## 2011-06-24 ENCOUNTER — Encounter (HOSPITAL_COMMUNITY): Payer: Self-pay | Admitting: Emergency Medicine

## 2011-06-24 DIAGNOSIS — N186 End stage renal disease: Secondary | ICD-10-CM | POA: Diagnosis present

## 2011-06-24 DIAGNOSIS — N19 Unspecified kidney failure: Secondary | ICD-10-CM

## 2011-06-24 DIAGNOSIS — N2581 Secondary hyperparathyroidism of renal origin: Secondary | ICD-10-CM | POA: Diagnosis present

## 2011-06-24 DIAGNOSIS — I10 Essential (primary) hypertension: Secondary | ICD-10-CM | POA: Diagnosis present

## 2011-06-24 DIAGNOSIS — Z992 Dependence on renal dialysis: Secondary | ICD-10-CM

## 2011-06-24 DIAGNOSIS — E119 Type 2 diabetes mellitus without complications: Secondary | ICD-10-CM | POA: Diagnosis present

## 2011-06-24 DIAGNOSIS — Z8739 Personal history of other diseases of the musculoskeletal system and connective tissue: Secondary | ICD-10-CM

## 2011-06-24 DIAGNOSIS — E785 Hyperlipidemia, unspecified: Secondary | ICD-10-CM | POA: Diagnosis present

## 2011-06-24 DIAGNOSIS — I120 Hypertensive chronic kidney disease with stage 5 chronic kidney disease or end stage renal disease: Principal | ICD-10-CM | POA: Diagnosis present

## 2011-06-24 DIAGNOSIS — D638 Anemia in other chronic diseases classified elsewhere: Secondary | ICD-10-CM | POA: Diagnosis present

## 2011-06-24 MED ORDER — HEPARIN SODIUM (PORCINE) 1000 UNIT/ML DIALYSIS: 20.0000 [IU]/kg | INTRAMUSCULAR | Status: DC | PRN

## 2011-06-24 MED ORDER — HEPARIN SODIUM (PORCINE) 1000 UNIT/ML DIALYSIS
20.0000 [IU]/kg | INTRAMUSCULAR | Status: DC | PRN
  Administered 2011-06-25: 1200 [IU] via INTRAVENOUS_CENTRAL
  Filled 2011-06-24: qty 2

## 2011-06-24 NOTE — ED Notes (Signed)
Pt is here for dialysis. Pt has no c/o just need dislysis.

## 2011-06-24 NOTE — ED Provider Notes (Signed)
History     CSN: [id number]  Arrival date & time 06/24/11  2001   First MD Initiated Contact with Patient 06/24/11 2217      Chief Complaint  Patient presents with  . Dialysis     HPI Renal failure Onset - a brief time ago Course - stable Worsened by - nothing Improved by - nothing  Pt denies sob/pain She is here for dialysis  Past Medical History  Diagnosis Date  . Chronic kidney disease     esrd  . Diabetes mellitus   . Hyperlipidemia   . Hypertension   . Renal disorder   . Dialysis care     tues, thurs, sat  . 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 Carotid endarterectomy 2002    left  . Btl   . Rectal fissurectomy   . Av fistula placement 10/30/2010    right brachiocephalic   . Fistulogram     Family History  Problem Relation Age of Onset  . COPD Mother   . Kidney disease Mother   . Heart disease Father   . Lymphoma Son     History  Substance Use Topics  . Smoking status: Current Everyday Smoker -- 1.0 packs/day for 50 years    Types: Cigarettes  . Smokeless tobacco: Never Used  . Alcohol Use: No    OB History    Grav Para Term Preterm Abortions TAB SAB Ect Mult Living                  Review of Systems  Constitutional: Negative for fever.  Respiratory: Negative for shortness of breath.     Allergies  Codeine; Epinephrine; and Percocet  Home Medications   Current Outpatient Rx  Name Route Sig Dispense Refill  . ACETAMINOPHEN 500 MG PO TABS Oral Take 500 mg by mouth every 6 (six) hours as needed. For pain    . ALLOPURINOL 100 MG PO TABS Oral Take 100 mg by mouth daily.      Marland Kitchen AMLODIPINE BESYLATE 5 MG PO TABS Oral Take 5 mg by mouth at bedtime.     Marland Kitchen CALCIUM ACETATE 667 MG PO CAPS Oral Take 1,334 mg by mouth 3 (three) times daily with meals.     Marland Kitchen COENZYME Q10 150 MG PO CAPS Oral Take 300 mg by mouth daily.     . COLCHICINE 0.6 MG PO TABS Oral Take 0.6 mg by mouth daily as needed. For gout.    .  CYCLOBENZAPRINE HCL 10 MG PO TABS Oral Take 10 mg by mouth 2 (two) times daily as needed. For muscle spasms    . DOCUSATE SODIUM 100 MG PO CAPS Oral Take 200 mg by mouth at bedtime.      Marland Kitchen HYDRALAZINE HCL 25 MG PO TABS Oral Take 50 mg by mouth 3 (three) times daily.     . L-METHYLFOLATE-B6-B12 3-35-2 MG PO TABS Oral Take 1 tablet by mouth 2 (two) times daily.     Marland Kitchen LOSARTAN POTASSIUM 50 MG PO TABS Oral Take 50 mg by mouth at bedtime.     Marland Kitchen METOPROLOL SUCCINATE ER 50 MG PO TB24 Oral Take 50 mg by mouth at bedtime.     Marland Kitchen RENA-VITE PO TABS Oral Take 1 tablet by mouth daily.      Marland Kitchen OVER THE COUNTER MEDICATION Oral Take 60-90 mLs by mouth daily. OTC. QiNopal    . REPAGLINIDE 0.5 MG PO TABS  Oral Take 0.5 mg by mouth 3 (three) times daily before meals.        BP 194/66  Pulse 94  Temp(Src) 98.3 F (36.8 C) (Oral)  Resp 20  SpO2 96%  Physical Exam CONSTITUTIONAL: Well developed/well nourished HEAD AND FACE: Normocephalic/atraumatic EYES: EOMI ENMT: Mucous membranes moist NECK: supple no meningeal signs LUNGS: Lungs are clear to auscultation bilaterally, no apparent distress ABDOMEN: soft NEURO: Pt is awake/alert, moves all extremitiesx4 EXTREMITIES:  full ROM SKIN: warm, color normal PSYCH: no abnormalities of mood noted  ED Course  Procedures  Labs Reviewed  GLUCOSE, CAPILLARY - Abnormal; Notable for the following:    Glucose-Capillary 129 (*)    All other components within normal limits    D/w dr Joneen Roach, will admit  MDM  Nursing notes reviewed and considered in documentation All labs/vitals reviewed and considered         Joya Gaskins, MD 06/24/11 2335

## 2011-06-24 NOTE — Consult Note (Signed)
  Pt. In ER some uremic and needs dialysis.

## 2011-06-24 NOTE — ED Notes (Signed)
MD at bedside. 

## 2011-06-25 ENCOUNTER — Encounter (HOSPITAL_COMMUNITY): Payer: Self-pay | Admitting: Family Medicine

## 2011-06-25 ENCOUNTER — Inpatient Hospital Stay (HOSPITAL_COMMUNITY): Payer: Medicare HMO

## 2011-06-25 DIAGNOSIS — N19 Unspecified kidney failure: Secondary | ICD-10-CM

## 2011-06-25 LAB — RENAL FUNCTION PANEL
Calcium: 9.4 mg/dL (ref 8.4–10.5)
GFR calc Af Amer: 6 mL/min — ABNORMAL LOW (ref >90)
Glucose, Bld: 160 mg/dL — ABNORMAL HIGH (ref 70–99)
Phosphorus: 2.3 mg/dL (ref 2.3–4.6)
Sodium: 139 mEq/L (ref 135–145)

## 2011-06-25 LAB — DIFFERENTIAL
Basophils Relative: 0 % (ref 0–1)
Eosinophils Absolute: 0.1 10*3/uL (ref 0.0–0.7)
Monocytes Absolute: 1.1 10*3/uL — ABNORMAL HIGH (ref 0.1–1.0)
Neutrophils Relative %: 52 % (ref 43–77)

## 2011-06-25 LAB — CBC
MCH: 27.2 pg (ref 26.0–34.0)
MCHC: 31.5 g/dL (ref 30.0–36.0)
Platelets: 248 10*3/uL (ref 150–400)

## 2011-06-25 MED ORDER — DARBEPOETIN ALFA-POLYSORBATE 100 MCG/0.5ML IJ SOLN
100.0000 ug | Freq: Once | INTRAMUSCULAR | Status: AC
  Administered 2011-06-25: 100 ug via INTRAVENOUS
  Filled 2011-06-25: qty 0.5

## 2011-06-25 MED ORDER — PARICALCITOL 5 MCG/ML IV SOLN
INTRAVENOUS | Status: AC
  Administered 2011-06-25: 1 ug via INTRAVENOUS
  Filled 2011-06-25: qty 1

## 2011-06-25 MED ORDER — PARICALCITOL 5 MCG/ML IV SOLN
1.0000 ug | INTRAVENOUS | Status: DC
  Administered 2011-06-25: 1 ug via INTRAVENOUS
  Filled 2011-06-25: qty 0.2

## 2011-06-25 MED ORDER — DARBEPOETIN ALFA-POLYSORBATE 100 MCG/0.5ML IJ SOLN
INTRAMUSCULAR | Status: AC
  Administered 2011-06-25: 100 ug via INTRAVENOUS
  Filled 2011-06-25: qty 0.5

## 2011-06-25 NOTE — Progress Notes (Signed)
06/25/2011 Alexandra [name] [facility] Case Management Note [phone]  Utilization review completed.  

## 2011-06-25 NOTE — H&P (Signed)
PCP:   [name], MD, MD   Chief Complaint:  Need for hemodialysis  HPI: This is a 66 y/o female who is here for her routine hemodialysis. She has no complaints, no SOB, no edema etc  Review of Systems:  The patient denies anorexia, fever, weight loss,, vision loss, decreased hearing, hoarseness, chest pain, syncope, dyspnea on exertion, peripheral edema, balance deficits, hemoptysis, abdominal pain, melena, hematochezia, severe indigestion/heartburn, hematuria, incontinence, genital sores, muscle weakness, suspicious skin lesions, transient blindness, difficulty walking, depression, unusual weight change, abnormal bleeding, enlarged lymph nodes, angioedema, and breast masses.  Past Medical History: Past Medical History  Diagnosis Date  . Chronic kidney disease     esrd  . Diabetes mellitus   . Hyperlipidemia   . Hypertension   . Renal disorder   . Dialysis care     tues, thurs, sat  . Gout due to renal impairment    Past Surgical History  Procedure Date  . Carotid endarterectomy 2002    left  . Btl   . Rectal fissurectomy   . Av fistula placement 10/30/2010    right brachiocephalic   . Fistulogram     Medications: Prior to Admission medications   Medication Sig Start Date End Date Taking? Authorizing Provider  acetaminophen (TYLENOL) 500 MG tablet Take 500 mg by mouth every 6 (six) hours as needed. For pain   Yes Historical Provider, MD  allopurinol (ZYLOPRIM) 100 MG tablet Take 100 mg by mouth daily.     Yes Historical Provider, MD  amLODipine (NORVASC) 5 MG tablet Take 5 mg by mouth at bedtime.    Yes Historical Provider, MD  calcium acetate (PHOSLO) 667 MG capsule Take 1,334 mg by mouth 3 (three) times daily with meals.    Yes Historical Provider, MD  Coenzyme Q10 150 MG CAPS Take 300 mg by mouth daily.    Yes Historical Provider, MD  colchicine 0.6 MG tablet Take 0.6 mg by mouth daily as needed. For gout.   Yes Historical Provider, MD  cyclobenzaprine  (FLEXERIL) 10 MG tablet Take 10 mg by mouth 2 (two) times daily as needed. For muscle spasms   Yes Historical Provider, MD  docusate sodium (COLACE) 100 MG capsule Take 200 mg by mouth at bedtime.     Yes Historical Provider, MD  hydrALAZINE (APRESOLINE) 25 MG tablet Take 50 mg by mouth 3 (three) times daily.    Yes Historical Provider, MD  l-methylfolate-B6-B12 (METANX) 3-35-2 MG TABS Take 1 tablet by mouth 2 (two) times daily.    Yes Historical Provider, MD  losartan (COZAAR) 50 MG tablet Take 50 mg by mouth at bedtime.    Yes Historical Provider, MD  metoprolol (TOPROL-XL) 50 MG 24 hr tablet Take 50 mg by mouth at bedtime.    Yes Historical Provider, MD  multivitamin (RENA-VIT) TABS tablet Take 1 tablet by mouth daily.     Yes Historical Provider, MD  OVER THE COUNTER MEDICATION Take 60-90 mLs by mouth daily. OTC. QiNopal   Yes Historical Provider, MD  repaglinide (PRANDIN) 0.5 MG tablet Take 0.5 mg by mouth 3 (three) times daily before meals.     Yes Historical Provider, MD    Allergies:   Allergies  Allergen Reactions  . Codeine Other (See Comments)    Passes out  . Epinephrine Other (See Comments)    Tachycardia, diaphoresis, syncope  . Percocet (Oxycodone-Acetaminophen) Other (See Comments)    Passes  out    Social History:  reports that she  has been smoking Cigarettes.  She has a 50 pack-year smoking history. She has never used smokeless tobacco. She reports that she does not drink alcohol or use illicit drugs.  Family History: Family History  Problem Relation Age of Onset  . COPD Mother   . Kidney disease Mother   . Heart disease Father   . Lymphoma Son     Physical Exam: Filed Vitals:   06/24/11 2010  BP: 194/66  Pulse: 94  Temp: 98.3 F (36.8 C)  TempSrc: Oral  Resp: 20  SpO2: 96%    General:  Alert and oriented times three, well developed and nourished, no acute distress Eyes: PERRLA, pink conjunctiva, no scleral icterus ENT: Moist oral mucosa, neck  supple, no thyromegaly Lungs: clear to ascultation, no wheeze, no crackles, no use of accessory muscles Cardiovascular: regular rate and rhythm, no regurgitation, no gallops, no murmurs. No carotid bruits, no JVD Abdomen: soft, positive BS, non-tender, non-distended, no organomegaly, not an acute abdomen GU: not examined Neuro: CN II - XII grossly intact, sensation intact Musculoskeletal: strength 5/5 all extremities, no clubbing, cyanosis or edema Skin: no rash, no subcutaneous crepitation, no decubitus Psych: appropriate patient   Labs on Admission:   Basename 06/23/11 0128  NA 135  K 4.5  CL 95*  CO2 26  GLUCOSE 134*  BUN 52*  CREATININE 9.31*  CALCIUM 9.6  MG --  PHOS 2.6    Basename 06/23/11 0128  AST --  ALT --  ALKPHOS --  BILITOT --  PROT --  ALBUMIN 3.3*   No results found for this basename: LIPASE:2,AMYLASE:2 in the last 72 hours  Basename 06/23/11 0128  WBC 8.1  NEUTROABS 5.0  HGB 10.6*  HCT 32.9*  MCV 84.6  PLT 265   No results found for this basename: CKTOTAL:3,CKMB:3,CKMBINDEX:3,TROPONINI:3 in the last 72 hours No components found with this basename: POCBNP:3 No results found for this basename: DDIMER:2 in the last 72 hours No results found for this basename: HGBA1C:2 in the last 72 hours No results found for this basename: CHOL:2,HDL:2,LDLCALC:2,TRIG:2,CHOLHDL:2,LDLDIRECT:2 in the last 72 hours No results found for this basename: TSH,T4TOTAL,FREET3,T3FREE,THYROIDAB in the last 72 hours No results found for this basename: VITAMINB12:2,FOLATE:2,FERRITIN:2,TIBC:2,IRON:2,RETICCTPCT:2 in the last 72 hours  Micro Results: No results found for this or any previous visit (from the past 240 hour(s)).   Radiological Exams on Admission: No results found.  Assessment/Plan Present on Admission:  ESRD Admit to med surg Nephrology aware Patient will be hemodialysed .Anemia of chronic disease .Diabetes mellitus .ESRD (end stage renal disease) on  dialysis .HTN (hypertension), benign .Hyperlipidemia .Hyperparathyroidism, secondary renal Stable, resume home meds   Loreen Bankson 06/25/2011, 12:36 AM

## 2011-06-25 NOTE — Progress Notes (Addendum)
Hemodialysis-Pt received treatment on unit and was Discharged through ED via wheelchair at 0530. Dr Bascom Levels paged per pt request at 0400 regarding medication question. Pt also stated that she called Dr. Janey Greaser cell phone multiple times and hung up. When asked what she needed she stated that "he shouldn't be sleeping if I have to be here so late."

## 2011-06-29 ENCOUNTER — Encounter (HOSPITAL_COMMUNITY): Payer: Self-pay | Admitting: *Deleted

## 2011-06-29 ENCOUNTER — Observation Stay (HOSPITAL_COMMUNITY)
Admission: EM | Admit: 2011-06-29 | Discharge: 2011-06-29 | Disposition: A | Discharge status: Home / Self Care | Payer: Medicare HMO | Attending: Internal Medicine | Admitting: Internal Medicine

## 2011-06-29 ENCOUNTER — Emergency Department (HOSPITAL_COMMUNITY): Payer: Medicare HMO

## 2011-06-29 DIAGNOSIS — N186 End stage renal disease: Secondary | ICD-10-CM | POA: Insufficient documentation

## 2011-06-29 DIAGNOSIS — I120 Hypertensive chronic kidney disease with stage 5 chronic kidney disease or end stage renal disease: Secondary | ICD-10-CM | POA: Insufficient documentation

## 2011-06-29 DIAGNOSIS — E119 Type 2 diabetes mellitus without complications: Secondary | ICD-10-CM | POA: Insufficient documentation

## 2011-06-29 DIAGNOSIS — N19 Unspecified kidney failure: Secondary | ICD-10-CM

## 2011-06-29 DIAGNOSIS — Z992 Dependence on renal dialysis: Secondary | ICD-10-CM

## 2011-06-29 DIAGNOSIS — E785 Hyperlipidemia, unspecified: Secondary | ICD-10-CM | POA: Insufficient documentation

## 2011-06-29 LAB — CBC
HCT: 35.9 % — ABNORMAL LOW (ref 36.0–46.0)
MCH: 28.1 pg (ref 26.0–34.0)
MCHC: 32.6 g/dL (ref 30.0–36.0)
MCV: 86.1 fL (ref 78.0–100.0)
RDW: 21.3 % — ABNORMAL HIGH (ref 11.5–15.5)
WBC: 7.6 10*3/uL (ref 4.0–10.5)

## 2011-06-29 LAB — GLUCOSE, CAPILLARY
Glucose-Capillary: 120 mg/dL — ABNORMAL HIGH (ref 70–99)
Glucose-Capillary: 170 mg/dL — ABNORMAL HIGH (ref 70–99)

## 2011-06-29 LAB — RENAL FUNCTION PANEL
Albumin: 3 g/dL — ABNORMAL LOW (ref 3.5–5.2)
BUN: 59 mg/dL — ABNORMAL HIGH (ref 6–23)
Chloride: 101 mEq/L (ref 96–112)
Creatinine, Ser: 9.64 mg/dL — ABNORMAL HIGH (ref 0.50–1.10)

## 2011-06-29 MED ORDER — L-METHYLFOLATE-B6-B12 3-35-2 MG PO TABS
1.0000 | ORAL_TABLET | Freq: Two times a day (BID) | ORAL | Status: DC
  Filled 2011-06-29: qty 1

## 2011-06-29 MED ORDER — COLCHICINE 0.6 MG PO TABS
0.6000 mg | ORAL_TABLET | Freq: Every day | ORAL | Status: DC | PRN
  Filled 2011-06-29: qty 1

## 2011-06-29 MED ORDER — CYCLOBENZAPRINE HCL 10 MG PO TABS: 10.0000 mg | ORAL_TABLET | Freq: Two times a day (BID) | ORAL | Status: DC | PRN

## 2011-06-29 MED ORDER — INSULIN ASPART 100 UNIT/ML ~~LOC~~ SOLN: 0.0000 [IU] | Freq: Three times a day (TID) | SUBCUTANEOUS | Status: DC

## 2011-06-29 MED ORDER — HEPARIN SODIUM (PORCINE) 1000 UNIT/ML DIALYSIS: 20.0000 [IU]/kg | INTRAMUSCULAR | Status: DC | PRN

## 2011-06-29 MED ORDER — PARICALCITOL 5 MCG/ML IV SOLN
INTRAVENOUS | Status: AC
  Administered 2011-06-29: 1 ug via INTRAVENOUS_CENTRAL
  Filled 2011-06-29: qty 1

## 2011-06-29 MED ORDER — METOPROLOL SUCCINATE ER 50 MG PO TB24
50.0000 mg | ORAL_TABLET | Freq: Every day | ORAL | Status: DC
  Filled 2011-06-29: qty 1

## 2011-06-29 MED ORDER — CALCIUM ACETATE 667 MG PO CAPS
1334.0000 mg | ORAL_CAPSULE | Freq: Three times a day (TID) | ORAL | Status: DC
  Filled 2011-06-29 (×2): qty 2

## 2011-06-29 MED ORDER — HEPARIN SODIUM (PORCINE) 5000 UNIT/ML IJ SOLN
5000.0000 [IU] | Freq: Three times a day (TID) | INTRAMUSCULAR | Status: DC
  Filled 2011-06-29 (×2): qty 1

## 2011-06-29 MED ORDER — DOCUSATE SODIUM 100 MG PO CAPS: 200.0000 mg | ORAL_CAPSULE | Freq: Every day | ORAL | Status: DC

## 2011-06-29 MED ORDER — HYDRALAZINE HCL 50 MG PO TABS
50.0000 mg | ORAL_TABLET | Freq: Three times a day (TID) | ORAL | Status: DC
  Filled 2011-06-29: qty 1

## 2011-06-29 MED ORDER — ACETAMINOPHEN 500 MG PO TABS
500.0000 mg | ORAL_TABLET | Freq: Four times a day (QID) | ORAL | Status: DC | PRN
  Filled 2011-06-29: qty 1

## 2011-06-29 MED ORDER — LOSARTAN POTASSIUM 50 MG PO TABS
50.0000 mg | ORAL_TABLET | Freq: Every day | ORAL | Status: DC
  Filled 2011-06-29: qty 1

## 2011-06-29 MED ORDER — REPAGLINIDE 0.5 MG PO TABS
0.5000 mg | ORAL_TABLET | Freq: Three times a day (TID) | ORAL | Status: DC
  Filled 2011-06-29: qty 1

## 2011-06-29 MED ORDER — RENA-VITE PO TABS
1.0000 | ORAL_TABLET | Freq: Every day | ORAL | Status: DC
  Filled 2011-06-29: qty 1

## 2011-06-29 MED ORDER — AMLODIPINE BESYLATE 5 MG PO TABS
5.0000 mg | ORAL_TABLET | Freq: Every day | ORAL | Status: DC
  Filled 2011-06-29: qty 1

## 2011-06-29 MED ORDER — ALLOPURINOL 100 MG PO TABS: 100.0000 mg | ORAL_TABLET | Freq: Every day | ORAL | Status: DC

## 2011-06-29 NOTE — ED Notes (Signed)
Pt here for dialysis 

## 2011-06-29 NOTE — H&P (Signed)
PCP:   Alexandra Floro, MD, MD   Chief Complaint:  Here for dialysis  HPI: [name] comes here for dialysis, say her BP high as she is fluid overloaded, and her sugar high as she ate some honey for breakfast, and it "was good".  Review of Systems:  The patient denies anorexia, fever, weight loss,, vision loss, decreased hearing, hoarseness, chest pain, syncope, dyspnea on exertion, peripheral edema, balance deficits, hemoptysis, abdominal pain, melena, hematochezia, severe indigestion/heartburn, hematuria, incontinence, genital sores, muscle weakness, suspicious skin lesions, transient blindness, difficulty walking, depression, unusual weight change, abnormal bleeding, enlarged lymph nodes, angioedema, and breast masses.  Past Medical History: Past Medical History  Diagnosis Date  . Chronic kidney disease     esrd  . Diabetes mellitus   . Hyperlipidemia   . Hypertension   . Renal disorder   . Dialysis care     tues, thurs, sat  . Gout due to renal impairment    Past Surgical History  Procedure Date  . Carotid endarterectomy 2002    left  . Btl   . Rectal fissurectomy   . Av fistula placement 10/30/2010    right brachiocephalic   . Fistulogram     Medications: Prior to Admission medications   Medication Sig Start Date End Date Taking? Authorizing Provider  acetaminophen (TYLENOL) 500 MG tablet Take 500 mg by mouth every 6 (six) hours as needed. For pain   Yes Historical Provider, MD  allopurinol (ZYLOPRIM) 100 MG tablet Take 100 mg by mouth daily.     Yes Historical Provider, MD  amLODipine (NORVASC) 5 MG tablet Take 5 mg by mouth at bedtime.    Yes Historical Provider, MD  calcium acetate (PHOSLO) 667 MG capsule Take 1,334 mg by mouth 3 (three) times daily with meals.    Yes Historical Provider, MD  Coenzyme Q10 150 MG CAPS Take 300 mg by mouth daily.    Yes Historical Provider, MD  colchicine 0.6 MG tablet Take 0.6 mg by mouth daily as needed. For gout.   Yes Historical  Provider, MD  cyclobenzaprine (FLEXERIL) 10 MG tablet Take 10 mg by mouth 2 (two) times daily as needed. For muscle spasms   Yes Historical Provider, MD  docusate sodium (COLACE) 100 MG capsule Take 200 mg by mouth at bedtime.     Yes Historical Provider, MD  hydrALAZINE (APRESOLINE) 25 MG tablet Take 50 mg by mouth 3 (three) times daily.    Yes Historical Provider, MD  l-methylfolate-B6-B12 (METANX) 3-35-2 MG TABS Take 1 tablet by mouth 2 (two) times daily.    Yes Historical Provider, MD  losartan (COZAAR) 50 MG tablet Take 50 mg by mouth at bedtime.    Yes Historical Provider, MD  metoprolol (TOPROL-XL) 50 MG 24 hr tablet Take 50 mg by mouth at bedtime.    Yes Historical Provider, MD  multivitamin (RENA-VIT) TABS tablet Take 1 tablet by mouth daily.     Yes Historical Provider, MD  OVER THE COUNTER MEDICATION Take 60-90 mLs by mouth daily. OTC. QiNopal   Yes Historical Provider, MD  repaglinide (PRANDIN) 0.5 MG tablet Take 0.5 mg by mouth 3 (three) times daily before meals.     Yes Historical Provider, MD    Allergies:   Allergies  Allergen Reactions  . Codeine Other (See Comments)    Passes out  . Epinephrine Other (See Comments)    Tachycardia, diaphoresis, syncope  . Percocet (Oxycodone-Acetaminophen) Other (See Comments)    Passes  out  Social History:  reports that she has been smoking Cigarettes.  She has a 50 pack-year smoking history. She has never used smokeless tobacco. She reports that she does not drink alcohol or use illicit drugs.  Family History: Family History  Problem Relation Age of Onset  . COPD Mother   . Kidney disease Mother   . Heart disease Father   . Lymphoma Son     Physical Exam: Filed Vitals:   06/29/11 1222 06/29/11 1455  BP: 213/83 205/84  Pulse: 88 92  Temp: 97.8 F (36.6 C)   TempSrc: Oral   Resp: 20 20  SpO2: 97% 95%   Comfortable at rest, not in distress. Lungs clear. S1S2 heard, no murmurs. RRR. Abdomen- soft, non tender. +BS. CNS-  grossly intact. Extremities- no pedal edema.   Labs on Admission:  No results found for this basename: NA:2,K:2,CL:2,CO2:2,GLUCOSE:2,BUN:2,CREATININE:2,CALCIUM:2,MG:2,PHOS:2 in the last 72 hours No results found for this basename: AST:2,ALT:2,ALKPHOS:2,BILITOT:2,PROT:2,ALBUMIN:2 in the last 72 hours No results found for this basename: LIPASE:2,AMYLASE:2 in the last 72 hours No results found for this basename: WBC:2,NEUTROABS:2,HGB:2,HCT:2,MCV:2,PLT:2 in the last 72 hours No results found for this basename: CKTOTAL:3,CKMB:3,CKMBINDEX:3,TROPONINI:3 in the last 72 hours No results found for this basename: TSH,T4TOTAL,FREET3,T3FREE,THYROIDAB in the last 72 hours No results found for this basename: VITAMINB12:2,FOLATE:2,FERRITIN:2,TIBC:2,IRON:2,RETICCTPCT:2 in the last 72 hours  Radiological Exams on Admission: No results found.  Assessment  1. ESRD on HD-Admit medicine.  for HD today per Dr Bascom Levels. 2. DM2- uncontrolled. Resume home meds. Compliance an issue. 3. Htn- Uncontrolled. Resume home meds. 4. Dvt/gi prophylaxis.  Patient likely to sign ama after hd.  Alexandra [name] [phone] 06/29/2011, 3:28 PM

## 2011-06-29 NOTE — Consult Note (Signed)
  Pt. In ER pt. To get  Dialysis.

## 2011-06-29 NOTE — ED Notes (Signed)
CBG 275 

## 2011-06-29 NOTE — ED Notes (Signed)
Paged Dr. Bascom Levels. Dr. Bascom Levels called back and stated to advised patient that he is on his way to see her. Patient advised.

## 2011-06-29 NOTE — ED Notes (Signed)
To dialysis via stretcher.

## 2011-06-29 NOTE — ED Notes (Signed)
Seen and examined by Dr. Bascom Levels

## 2011-06-29 NOTE — ED Provider Notes (Signed)
History     CSN: [id number]  Arrival date & time 06/29/11  1215   First MD Initiated Contact with Patient 06/29/11 1231      Chief Complaint  Patient presents with  . Vascular Access Problem    (Consider location/radiation/quality/duration/timing/severity/associated sxs/prior treatment) Patient is a 66 y.o. female presenting with general illness. The history is provided by the patient.  Illness  The current episode started today (c/c: needs dialysis). The problem occurs frequently. The problem has been unchanged. The problem is mild. The symptoms are relieved by nothing. The symptoms are aggravated by nothing. Pertinent negatives include no fever, no abdominal pain and no headaches. She has been behaving normally. She has been eating and drinking normally. Recently, medical care has been given at this facility. Services Performed: dialysis performed 4/12.    Past Medical History  Diagnosis Date  . Chronic kidney disease     esrd  . Diabetes mellitus   . Hyperlipidemia   . Hypertension   . Renal disorder   . Dialysis care     tues, thurs, sat  . 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 Carotid endarterectomy 2002    left  . Btl   . Rectal fissurectomy   . Av fistula placement 10/30/2010    right brachiocephalic   . Fistulogram     Family History  Problem Relation Age of Onset  . COPD Mother   . Kidney disease Mother   . Heart disease Father   . Lymphoma Son     History  Substance Use Topics  . Smoking status: Current Everyday Smoker -- 1.0 packs/day for 50 years    Types: Cigarettes  . Smokeless tobacco: Never Used  . Alcohol Use: No    OB History    Grav Para Term Preterm Abortions TAB SAB Ect Mult Living                  Review of Systems  Constitutional: Negative for fever and fatigue.  Respiratory: Negative for shortness of breath.   Cardiovascular: Negative for chest pain.  Gastrointestinal: Negative for  abdominal pain.  Neurological: Negative for headaches.  All other systems reviewed and are negative.    Allergies  Codeine; Epinephrine; and Percocet  Home Medications   Current Outpatient Rx  Name Route Sig Dispense Refill  . ACETAMINOPHEN 500 MG PO TABS Oral Take 500 mg by mouth every 6 (six) hours as needed. For pain    . ALLOPURINOL 100 MG PO TABS Oral Take 100 mg by mouth daily.      Marland Kitchen AMLODIPINE BESYLATE 5 MG PO TABS Oral Take 5 mg by mouth at bedtime.     Marland Kitchen CALCIUM ACETATE 667 MG PO CAPS Oral Take 1,334 mg by mouth 3 (three) times daily with meals.     Marland Kitchen COENZYME Q10 150 MG PO CAPS Oral Take 300 mg by mouth daily.     . COLCHICINE 0.6 MG PO TABS Oral Take 0.6 mg by mouth daily as needed. For gout.    . CYCLOBENZAPRINE HCL 10 MG PO TABS Oral Take 10 mg by mouth 2 (two) times daily as needed. For muscle spasms    . DOCUSATE SODIUM 100 MG PO CAPS Oral Take 200 mg by mouth at bedtime.      Marland Kitchen HYDRALAZINE HCL 25 MG PO TABS Oral Take 50 mg by mouth 3 (three) times daily.     . L-METHYLFOLATE-B6-B12 3-35-2  MG PO TABS Oral Take 1 tablet by mouth 2 (two) times daily.     Marland Kitchen LOSARTAN POTASSIUM 50 MG PO TABS Oral Take 50 mg by mouth at bedtime.     Marland Kitchen METOPROLOL SUCCINATE ER 50 MG PO TB24 Oral Take 50 mg by mouth at bedtime.     Marland Kitchen RENA-VITE PO TABS Oral Take 1 tablet by mouth daily.      Marland Kitchen OVER THE COUNTER MEDICATION Oral Take 60-90 mLs by mouth daily. OTC. QiNopal    . REPAGLINIDE 0.5 MG PO TABS Oral Take 0.5 mg by mouth 3 (three) times daily before meals.        BP 213/83  Pulse 88  Temp(Src) 97.8 F (36.6 C) (Oral)  Resp 20  SpO2 97%  Physical Exam  Nursing note and vitals reviewed. Constitutional: She is oriented to person, place, and time. She appears well-developed and well-nourished.  HENT:  Head: Normocephalic and atraumatic.  Eyes: EOM are normal.  Neck: Normal range of motion.  Cardiovascular: Normal rate and regular rhythm.   Pulmonary/Chest: Effort normal.       R  sided permcath in place   Abdominal: Soft.  Musculoskeletal: Normal range of motion.  Neurological: She is alert and oriented to person, place, and time.  Skin: Skin is warm and dry.  Psychiatric: She has a normal mood and affect.    ED Course  Procedures (including critical care time)  Labs Reviewed  GLUCOSE, CAPILLARY - Abnormal; Notable for the following:    Glucose-Capillary 275 (*)    All other components within normal limits  GLUCOSE, CAPILLARY - Abnormal; Notable for the following:    Glucose-Capillary 226 (*)    All other components within normal limits  GLUCOSE, CAPILLARY - Abnormal; Notable for the following:    Glucose-Capillary 189 (*)    All other components within normal limits  GLUCOSE, CAPILLARY - Abnormal; Notable for the following:    Glucose-Capillary 170 (*)    All other components within normal limits  CBC  CREATININE, SERUM  CBC  BASIC METABOLIC PANEL   No results found.   1. Renal failure   2. Hemodialysis patient       MDM  Pt presents for regularly scheduled dialysis. No compaints. Denies CP/SOB/fever.    Consult from triad for admission/dialysis.         Donnamarie Poag, MD 06/29/11 1556

## 2011-06-29 NOTE — ED Notes (Signed)
CBG 170 

## 2011-06-29 NOTE — ED Notes (Signed)
At 1253 Nehemiah Massed attempted to draw I-Stat Chem 8 on this patient. Patient refused and stated she will have this drawn in dialysis. Order was accidentally click off as being drawn. I-Stat Chem 8 reordered.

## 2011-06-29 NOTE — ED Notes (Signed)
Report given to [facility] in Dialysis

## 2011-07-01 ENCOUNTER — Observation Stay (HOSPITAL_COMMUNITY)
Admission: EM | Admit: 2011-07-01 | Discharge: 2011-07-01 | Disposition: A | Discharge status: Home / Self Care | Payer: Medicare HMO | Attending: Internal Medicine | Admitting: Internal Medicine

## 2011-07-01 ENCOUNTER — Encounter (HOSPITAL_COMMUNITY): Payer: Self-pay | Admitting: Emergency Medicine

## 2011-07-01 ENCOUNTER — Emergency Department (HOSPITAL_COMMUNITY): Admit: 2011-07-01 | Discharge: 2011-07-01 | Disposition: A | Discharge status: Home / Self Care | Payer: Medicare HMO

## 2011-07-01 DIAGNOSIS — Z992 Dependence on renal dialysis: Principal | ICD-10-CM | POA: Insufficient documentation

## 2011-07-01 DIAGNOSIS — E119 Type 2 diabetes mellitus without complications: Secondary | ICD-10-CM | POA: Insufficient documentation

## 2011-07-01 DIAGNOSIS — N186 End stage renal disease: Secondary | ICD-10-CM | POA: Insufficient documentation

## 2011-07-01 DIAGNOSIS — I120 Hypertensive chronic kidney disease with stage 5 chronic kidney disease or end stage renal disease: Secondary | ICD-10-CM | POA: Insufficient documentation

## 2011-07-01 DIAGNOSIS — E785 Hyperlipidemia, unspecified: Secondary | ICD-10-CM | POA: Insufficient documentation

## 2011-07-01 LAB — RENAL FUNCTION PANEL
CO2: 25 mEq/L (ref 19–32)
Chloride: 96 mEq/L (ref 96–112)
GFR calc Af Amer: 4 mL/min — ABNORMAL LOW (ref >90)
Glucose, Bld: 196 mg/dL — ABNORMAL HIGH (ref 70–99)
Potassium: 4.7 mEq/L (ref 3.5–5.1)
Sodium: 136 mEq/L (ref 135–145)

## 2011-07-01 LAB — DIFFERENTIAL
Eosinophils Relative: 1 % (ref 0–5)
Lymphocytes Relative: 23 % (ref 12–46)
Lymphs Abs: 1.5 10*3/uL (ref 0.7–4.0)
Neutrophils Relative %: 62 % (ref 43–77)

## 2011-07-01 LAB — CBC
Hemoglobin: 11.7 g/dL — ABNORMAL LOW (ref 12.0–15.0)
MCH: 27.1 pg (ref 26.0–34.0)
RBC: 4.32 MIL/uL (ref 3.87–5.11)

## 2011-07-01 LAB — GLUCOSE, CAPILLARY: Glucose-Capillary: 184 mg/dL — ABNORMAL HIGH (ref 70–99)

## 2011-07-01 MED ORDER — DARBEPOETIN ALFA-POLYSORBATE 100 MCG/0.5ML IJ SOLN
INTRAMUSCULAR | Status: AC
  Filled 2011-07-01: qty 0.5

## 2011-07-01 MED ORDER — PARICALCITOL 5 MCG/ML IV SOLN
INTRAVENOUS | Status: AC
  Filled 2011-07-01: qty 1

## 2011-07-01 MED ORDER — DARBEPOETIN ALFA-POLYSORBATE 100 MCG/0.5ML IJ SOLN
100.0000 ug | Freq: Once | INTRAMUSCULAR | Status: AC
  Administered 2011-07-01: 100 ug via INTRAVENOUS

## 2011-07-01 MED ORDER — HEPARIN SODIUM (PORCINE) 1000 UNIT/ML DIALYSIS: 20.0000 [IU]/kg | INTRAMUSCULAR | Status: DC | PRN

## 2011-07-01 MED ORDER — PARICALCITOL 5 MCG/ML IV SOLN
1.0000 ug | Freq: Once | INTRAVENOUS | Status: AC
  Administered 2011-07-01: 1 ug via INTRAVENOUS

## 2011-07-01 NOTE — ED Provider Notes (Signed)
Medical screening examination/treatment/procedure(s) were performed by non-physician practitioner and as supervising physician I was immediately available for consultation/collaboration.   Celene Kras, MD 07/01/11 [id number]

## 2011-07-01 NOTE — ED Notes (Addendum)
Pt upset that her orders have not been put in by Dr. Bascom Levels.  I informed her he was paged upon her arrival and I had him paged again.  Pt requests a phone to call his personal number.  I requested that pt wait a couple minutes for MD to return page.

## 2011-07-01 NOTE — H&P (Signed)
PCP:   Alexandra Floro, MD, MD   Chief Complaint:  I need HD  HPI: 66 yo woman here for HD. Denies any new complains  Review of Systems:  Refuses to answer  Past Medical History: Past Medical History  Diagnosis Date  . Chronic kidney disease     esrd  . Diabetes mellitus   . Hyperlipidemia   . Hypertension   . Renal disorder   . Dialysis care     tues, thurs, sat  . Gout due to renal impairment    Past Surgical History  Procedure Date  . Carotid endarterectomy 2002    left  . Btl   . Rectal fissurectomy   . Av fistula placement 10/30/2010    right brachiocephalic   . Fistulogram     Medications: Prior to Admission medications   Medication Sig Start Date End Date Taking? Authorizing Provider  acetaminophen (TYLENOL) 500 MG tablet Take 500 mg by mouth every 6 (six) hours as needed. For pain   Yes Historical Provider, MD  allopurinol (ZYLOPRIM) 100 MG tablet Take 100 mg by mouth daily.     Yes Historical Provider, MD  amLODipine (NORVASC) 5 MG tablet Take 5 mg by mouth at bedtime.    Yes Historical Provider, MD  calcium acetate (PHOSLO) 667 MG capsule Take 1,334 mg by mouth 3 (three) times daily with meals.    Yes Historical Provider, MD  Coenzyme Q10 150 MG CAPS Take 300 mg by mouth daily.    Yes Historical Provider, MD  colchicine 0.6 MG tablet Take 0.6 mg by mouth daily as needed. For gout.   Yes Historical Provider, MD  cyclobenzaprine (FLEXERIL) 10 MG tablet Take 10 mg by mouth 2 (two) times daily as needed. For muscle spasms   Yes Historical Provider, MD  docusate sodium (COLACE) 100 MG capsule Take 200 mg by mouth at bedtime.     Yes Historical Provider, MD  hydrALAZINE (APRESOLINE) 25 MG tablet Take 50 mg by mouth 3 (three) times daily.    Yes Historical Provider, MD  l-methylfolate-B6-B12 (METANX) 3-35-2 MG TABS Take 1 tablet by mouth 2 (two) times daily.    Yes Historical Provider, MD  losartan (COZAAR) 50 MG tablet Take 50 mg by mouth at bedtime.    Yes  Historical Provider, MD  metoprolol (TOPROL-XL) 50 MG 24 hr tablet Take 50 mg by mouth at bedtime.    Yes Historical Provider, MD  multivitamin (RENA-VIT) TABS tablet Take 1 tablet by mouth daily.     Yes Historical Provider, MD  OVER THE COUNTER MEDICATION Take 60-90 mLs by mouth daily. OTC. QiNopal   Yes Historical Provider, MD  repaglinide (PRANDIN) 0.5 MG tablet Take 0.5 mg by mouth 3 (three) times daily before meals.     Yes Historical Provider, MD    Allergies:   Allergies  Allergen Reactions  . Codeine Other (See Comments)    Passes out  . Epinephrine Other (See Comments)    Tachycardia, diaphoresis, syncope  . Percocet (Oxycodone-Acetaminophen) Other (See Comments)    Passes  out    Social History:  reports that she has been smoking Cigarettes.  She has a 50 pack-year smoking history. She has never used smokeless tobacco. She reports that she does not drink alcohol or use illicit drugs.  History   Social History Narrative  . No narrative on file     Family History: Family History  Problem Relation Age of Onset  . COPD Mother   .  Kidney disease Mother   . Heart disease Father   . Lymphoma Son     Physical Exam: Filed Vitals:   07/01/11 1256  BP: 210/85  Pulse: 89  Temp: 97.8 F (36.6 C)  TempSrc: Oral  SpO2: 94%   Refuses to allow   Labs on Admission:   [facility] 06/29/11 1601  NA 141  K 4.2  CL 101  CO2 26  GLUCOSE 145*  BUN 59*  CREATININE 9.64*  CALCIUM 9.2  MG --  PHOS 2.0*    Basename 06/29/11 1601  AST --  ALT --  ALKPHOS --  BILITOT --  PROT --  ALBUMIN 3.0*   No results found for this basename: LIPASE:2,AMYLASE:2 in the last 72 hours  Basename 06/29/11 1601  WBC 7.6  NEUTROABS --  HGB 11.7*  HCT 35.9*  MCV 86.1  PLT 245   No results found for this basename: CKTOTAL:3,CKMB:3,CKMBINDEX:3,TROPONINI:3 in the last 72 hours No results found for this basename: TSH,T4TOTAL,FREET3,T3FREE,THYROIDAB in the last 72 hours No  results found for this basename: VITAMINB12:2,FOLATE:2,FERRITIN:2,TIBC:2,IRON:2,RETICCTPCT:2 in the last 72 hours  Radiological Exams on Admission: No results found.  Assessment/Plan ESRD Patient getting HD per nephrology. No new issues reported  Alexandra [name] 07/01/2011, 6:04 PM

## 2011-07-01 NOTE — ED Notes (Signed)
Dinner tray ordered, Renal diet. 

## 2011-07-01 NOTE — Progress Notes (Signed)
Met extensively with patient after she came out to the nurse's station with angry and loud comments about her length of stay. Attempted to direct patient back to her room and address her concerns. I informed her of the method that is to be in place according to Dr. Rito Ehrlich. Patient frequently interrupted me telling me her credentials and work background, telling me that I do not know what I am talking about. I did finally get patient to calm down and listen; she agreed with the plan. However, she did request the full spelling of my name on several occasions. The attending nurse called dialysis once Dr. Bascom Levels had written the orders. However, she was told that the patient's dialysis nurse went to lunch and they cannot take her yet.

## 2011-07-01 NOTE — ED Provider Notes (Signed)
History     CSN: [id number]  Arrival date & time 07/01/11  1252   First MD Initiated Contact with Patient 07/01/11 1304      Chief Complaint  Patient presents with  . Vascular Access Problem    (Consider location/radiation/quality/duration/timing/severity/associated sxs/prior treatment) HPI Comments: Pt is a 66yo female who presents here for her dialysis. Last dialyzed 2 days ago. Pt has no other complaints. Denies SOB, denies leg swelling, denies headache, fever, chills, nausea, vomiting, chest or abdominal pain. Nothing makes her symptoms better or worse.  The history is provided by the patient.    Past Medical History  Diagnosis Date  . Chronic kidney disease     esrd  . Diabetes mellitus   . Hyperlipidemia   . Hypertension   . Renal disorder   . Dialysis care     tues, thurs, sat  . 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 Carotid endarterectomy 2002    left  . Btl   . Rectal fissurectomy   . Av fistula placement 10/30/2010    right brachiocephalic   . Fistulogram     Family History  Problem Relation Age of Onset  . COPD Mother   . Kidney disease Mother   . Heart disease Father   . Lymphoma Son     History  Substance Use Topics  . Smoking status: Current Everyday Smoker -- 1.0 packs/day for 50 years    Types: Cigarettes  . Smokeless tobacco: Never Used  . Alcohol Use: No    OB History    Grav Para Term Preterm Abortions TAB SAB Ect Mult Living                  Review of Systems  Constitutional: Negative for fever and chills.  HENT: Negative for facial swelling and neck pain.   Respiratory: Negative for cough, chest tightness and shortness of breath.   Cardiovascular: Negative for chest pain and leg swelling.  Gastrointestinal: Negative for nausea, vomiting and abdominal pain.  Genitourinary: Negative.   Musculoskeletal: Negative for back pain.  Neurological: Negative for dizziness, numbness and headaches.    Psychiatric/Behavioral: Negative.     Allergies  Codeine; Epinephrine; and Percocet  Home Medications   Current Outpatient Rx  Name Route Sig Dispense Refill  . ACETAMINOPHEN 500 MG PO TABS Oral Take 500 mg by mouth every 6 (six) hours as needed. For pain    . ALLOPURINOL 100 MG PO TABS Oral Take 100 mg by mouth daily.      Marland Kitchen AMLODIPINE BESYLATE 5 MG PO TABS Oral Take 5 mg by mouth at bedtime.     Marland Kitchen CALCIUM ACETATE 667 MG PO CAPS Oral Take 1,334 mg by mouth 3 (three) times daily with meals.     Marland Kitchen COENZYME Q10 150 MG PO CAPS Oral Take 300 mg by mouth daily.     . COLCHICINE 0.6 MG PO TABS Oral Take 0.6 mg by mouth daily as needed. For gout.    . CYCLOBENZAPRINE HCL 10 MG PO TABS Oral Take 10 mg by mouth 2 (two) times daily as needed. For muscle spasms    . DOCUSATE SODIUM 100 MG PO CAPS Oral Take 200 mg by mouth at bedtime.      Marland Kitchen HYDRALAZINE HCL 25 MG PO TABS Oral Take 50 mg by mouth 3 (three) times daily.     . L-METHYLFOLATE-B6-B12 3-35-2 MG PO TABS Oral Take 1 tablet  by mouth 2 (two) times daily.     Marland Kitchen LOSARTAN POTASSIUM 50 MG PO TABS Oral Take 50 mg by mouth at bedtime.     Marland Kitchen METOPROLOL SUCCINATE ER 50 MG PO TB24 Oral Take 50 mg by mouth at bedtime.     Marland Kitchen RENA-VITE PO TABS Oral Take 1 tablet by mouth daily.      Marland Kitchen OVER THE COUNTER MEDICATION Oral Take 60-90 mLs by mouth daily. OTC. QiNopal    . REPAGLINIDE 0.5 MG PO TABS Oral Take 0.5 mg by mouth 3 (three) times daily before meals.        BP 210/85  Pulse 89  Temp(Src) 97.8 F (36.6 C) (Oral)  SpO2 94%  Physical Exam  Nursing note and vitals reviewed. Constitutional: She is oriented to person, place, and time. She appears well-developed and well-nourished.  HENT:  Head: Normocephalic.  Eyes: Conjunctivae are normal.  Neck: Neck supple.  Cardiovascular: Normal rate, regular rhythm and normal heart sounds.   Pulmonary/Chest: Effort normal and breath sounds normal. No respiratory distress. She has no wheezes. She has no  rales.  Abdominal: Soft. Bowel sounds are normal. There is no tenderness.  Musculoskeletal: Normal range of motion. She exhibits no edema.  Neurological: She is alert and oriented to person, place, and time.  Skin: Skin is warm and dry.  Psychiatric: She has a normal mood and affect.    ED Course  Procedures (including critical care time)  Pt here for her routine dialysis. No complaints. Dr. Bascom Levels contacted, asked for [facility] to admit.   Spoke with [facility], will admit to obs team 6.     1. ESRD (end stage renal disease) on dialysis       MDM          Lottie Mussel, PA 07/01/11 1510

## 2011-07-01 NOTE — Consult Note (Signed)
Pt. In ER she is in need of dialysis. See orders.

## 2011-07-01 NOTE — ED Notes (Signed)
Called dialysis to give report and send pt.  Nurse currently at lunch and they cannot accept pt.  Informed they will call me when nurse back from lunch.

## 2011-07-01 NOTE — ED Notes (Signed)
Dr Bascom Levels returned call.  [facility] has not admitted yet.  PA notified that for this pt, [facility] will admit without labs.  [facility] paged to admit.  Dr. Bascom Levels on way to input orders.

## 2011-07-01 NOTE — ED Notes (Signed)
Pt here for dialysis 

## 2011-07-02 NOTE — ED Provider Notes (Signed)
I reviewed the resident's note and I agree with the findings and plan.        Nelia Shi, MD 07/02/11 2032

## 2011-07-02 NOTE — Discharge Summary (Signed)
Ms Tiburcio Pea signed out against medical advice on 06/29/11 after dialysis.  Laelynn Blizzard,MD pager#[phone].

## 2011-07-03 ENCOUNTER — Emergency Department (HOSPITAL_COMMUNITY): Payer: Medicare HMO

## 2011-07-03 ENCOUNTER — Inpatient Hospital Stay (HOSPITAL_COMMUNITY)
Admission: EM | Admit: 2011-07-03 | Discharge: 2011-07-04 | DRG: 682 | Disposition: A | Discharge status: Home / Self Care | Payer: Medicare HMO | Attending: Internal Medicine | Admitting: Internal Medicine

## 2011-07-03 ENCOUNTER — Encounter (HOSPITAL_COMMUNITY): Payer: Self-pay | Admitting: Emergency Medicine

## 2011-07-03 DIAGNOSIS — F17200 Nicotine dependence, unspecified, uncomplicated: Secondary | ICD-10-CM | POA: Diagnosis present

## 2011-07-03 DIAGNOSIS — N19 Unspecified kidney failure: Secondary | ICD-10-CM

## 2011-07-03 DIAGNOSIS — E785 Hyperlipidemia, unspecified: Secondary | ICD-10-CM | POA: Diagnosis present

## 2011-07-03 DIAGNOSIS — E119 Type 2 diabetes mellitus without complications: Secondary | ICD-10-CM | POA: Diagnosis present

## 2011-07-03 DIAGNOSIS — Z992 Dependence on renal dialysis: Secondary | ICD-10-CM

## 2011-07-03 DIAGNOSIS — N186 End stage renal disease: Secondary | ICD-10-CM | POA: Diagnosis present

## 2011-07-03 DIAGNOSIS — I120 Hypertensive chronic kidney disease with stage 5 chronic kidney disease or end stage renal disease: Principal | ICD-10-CM | POA: Diagnosis present

## 2011-07-03 LAB — CBC
Hemoglobin: 11.8 g/dL — ABNORMAL LOW (ref 12.0–15.0)
MCH: 27.4 pg (ref 26.0–34.0)
RBC: 4.3 MIL/uL (ref 3.87–5.11)
WBC: 7.3 10*3/uL (ref 4.0–10.5)

## 2011-07-03 LAB — DIFFERENTIAL
Basophils Relative: 1 % (ref 0–1)
Eosinophils Relative: 2 % (ref 0–5)
Monocytes Absolute: 0.9 10*3/uL (ref 0.1–1.0)
Monocytes Relative: 13 % — ABNORMAL HIGH (ref 3–12)
Neutrophils Relative %: 54 % (ref 43–77)

## 2011-07-03 LAB — RENAL FUNCTION PANEL
BUN: 57 mg/dL — ABNORMAL HIGH (ref 6–23)
CO2: 27 mEq/L (ref 19–32)
Chloride: 96 mEq/L (ref 96–112)
Creatinine, Ser: 8.93 mg/dL — ABNORMAL HIGH (ref 0.50–1.10)
GFR calc non Af Amer: 4 mL/min — ABNORMAL LOW (ref >90)
Potassium: 4.1 mEq/L (ref 3.5–5.1)

## 2011-07-03 LAB — GLUCOSE, CAPILLARY: Glucose-Capillary: 117 mg/dL — ABNORMAL HIGH (ref 70–99)

## 2011-07-03 MED ORDER — DOCUSATE SODIUM 100 MG PO CAPS: 200.0000 mg | ORAL_CAPSULE | Freq: Every day | ORAL | Status: DC

## 2011-07-03 MED ORDER — AMLODIPINE BESYLATE 5 MG PO TABS: 5.0000 mg | ORAL_TABLET | Freq: Every day | ORAL | Status: DC

## 2011-07-03 MED ORDER — ACETAMINOPHEN 325 MG PO TABS: 650.0000 mg | ORAL_TABLET | Freq: Four times a day (QID) | ORAL | Status: DC | PRN

## 2011-07-03 MED ORDER — HYDRALAZINE HCL 50 MG PO TABS: 50.0000 mg | ORAL_TABLET | Freq: Three times a day (TID) | ORAL | Status: DC

## 2011-07-03 MED ORDER — L-METHYLFOLATE-B6-B12 3-35-2 MG PO TABS: 1.0000 | ORAL_TABLET | Freq: Two times a day (BID) | ORAL | Status: DC

## 2011-07-03 MED ORDER — INSULIN ASPART 100 UNIT/ML ~~LOC~~ SOLN: 0.0000 [IU] | Freq: Three times a day (TID) | SUBCUTANEOUS | Status: DC

## 2011-07-03 MED ORDER — ONDANSETRON HCL 4 MG/2ML IJ SOLN: 4.0000 mg | Freq: Four times a day (QID) | INTRAMUSCULAR | Status: DC | PRN

## 2011-07-03 MED ORDER — ACETAMINOPHEN 325 MG PO TABS
ORAL_TABLET | ORAL | Status: AC
  Administered 2011-07-03: 650 mg via ORAL
  Filled 2011-07-03: qty 2

## 2011-07-03 MED ORDER — CALCIUM ACETATE 667 MG PO CAPS
1334.0000 mg | ORAL_CAPSULE | Freq: Three times a day (TID) | ORAL | Status: DC
  Filled 2011-07-03: qty 2

## 2011-07-03 MED ORDER — PARICALCITOL 5 MCG/ML IV SOLN: 1.0000 ug | Freq: Once | INTRAVENOUS | Status: DC

## 2011-07-03 MED ORDER — ACETAMINOPHEN 325 MG PO TABS
650.0000 mg | ORAL_TABLET | Freq: Four times a day (QID) | ORAL | Status: DC | PRN
  Administered 2011-07-03: 650 mg via ORAL

## 2011-07-03 MED ORDER — RENA-VITE PO TABS: 1.0000 | ORAL_TABLET | Freq: Every day | ORAL | Status: DC

## 2011-07-03 MED ORDER — REPAGLINIDE 0.5 MG PO TABS
0.5000 mg | ORAL_TABLET | Freq: Three times a day (TID) | ORAL | Status: DC
  Filled 2011-07-03: qty 1

## 2011-07-03 MED ORDER — INSULIN ASPART 100 UNIT/ML ~~LOC~~ SOLN: 0.0000 [IU] | Freq: Every day | SUBCUTANEOUS | Status: DC

## 2011-07-03 MED ORDER — METOPROLOL SUCCINATE ER 50 MG PO TB24: 50.0000 mg | ORAL_TABLET | Freq: Every day | ORAL | Status: DC

## 2011-07-03 MED ORDER — ACETAMINOPHEN 650 MG RE SUPP: 650.0000 mg | Freq: Four times a day (QID) | RECTAL | Status: DC | PRN

## 2011-07-03 MED ORDER — ACETAMINOPHEN 500 MG PO TABS: 500.0000 mg | ORAL_TABLET | Freq: Four times a day (QID) | ORAL | Status: DC | PRN

## 2011-07-03 MED ORDER — HEPARIN SODIUM (PORCINE) 1000 UNIT/ML DIALYSIS
20.0000 [IU]/kg | INTRAMUSCULAR | Status: DC | PRN
  Administered 2011-07-03: 1200 [IU] via INTRAVENOUS_CENTRAL
  Filled 2011-07-03: qty 2

## 2011-07-03 MED ORDER — CYCLOBENZAPRINE HCL 10 MG PO TABS: 10.0000 mg | ORAL_TABLET | Freq: Two times a day (BID) | ORAL | Status: DC | PRN

## 2011-07-03 MED ORDER — ONDANSETRON HCL 4 MG PO TABS: 4.0000 mg | ORAL_TABLET | Freq: Four times a day (QID) | ORAL | Status: DC | PRN

## 2011-07-03 MED ORDER — ALLOPURINOL 100 MG PO TABS: 100.0000 mg | ORAL_TABLET | Freq: Every day | ORAL | Status: DC

## 2011-07-03 MED ORDER — LOSARTAN POTASSIUM 50 MG PO TABS: 50.0000 mg | ORAL_TABLET | Freq: Every day | ORAL | Status: DC

## 2011-07-03 MED ORDER — PARICALCITOL 5 MCG/ML IV SOLN
INTRAVENOUS | Status: AC
  Filled 2011-07-03: qty 1

## 2011-07-03 NOTE — ED Notes (Signed)
Received bedside report from Ed, Charity fundraiser.  Patient currently sitting up in bed watching television and eating dinner.  Dialysis unit called; RN states that they are currently trying to discharge patients and that as soon as they get a machine available, they will call for patient to be transported.  EDP (Dr. Karma Ganja) notified about delay.  Introduced self to patient and updated whiteboard in room; patient has no other questions or concerns at this time.  Will continue to monitor.

## 2011-07-03 NOTE — ED Notes (Signed)
Patient currently sitting up in bed; no respiratory or acute distress noted.  Patient updated on plan of care; informed patient that dialysis unit has been called and is aware that she is down in ED; dialysis to call once they are ready for patient.  Patient has no other questions or concerns at this time; will continue to monitor.

## 2011-07-03 NOTE — H&P (Signed)
PCP:  [name], MD, MD    Chief Complaint:   Here for HD  HPI: Alexandra [name] is a 66 y.o. female   has a past medical history of Chronic kidney disease; Diabetes mellitus; Hyperlipidemia; Hypertension; Renal disorder; Dialysis care; and Gout due to renal impairment.   Presented with  Needs HD, no other complains ,sinus infection getting better  Review of Systems:    Pertinent positives include:productive cough,   Constitutional:  No weight loss, night sweats, Fevers, chills, fatigue, weight loss  HEENT:  No headaches, Difficulty swallowing,Tooth/dental problems,Sore throat,  No sneezing, itching, ear ache, nasal congestion, post nasal drip,  Cardio-vascular:  No chest pain, Orthopnea, PND, anasarca, dizziness, palpitations.no Bilateral lower extremity swelling  GI:  No heartburn, indigestion, abdominal pain, nausea, vomiting, diarrhea, change in bowel habits, loss of appetite, melena, blood in stool, hematemesis Resp:  no shortness of breath at rest. No dyspnea on exertion, No excess mucus, no coughing up of blood.No change in color of mucus.No wheezing. Skin:  no rash or lesions. No jaundice GU:  no dysuria, change in color of urine, no urgency or frequency. No straining to urinate.  No flank pain.  Musculoskeletal:  No joint pain or no joint swelling. No decreased range of motion. No back pain.  Psych:  No change in mood or affect. No depression or anxiety. No memory loss.  Neuro: no localizing neurological complaints, no tingling, no weakness, no double vision, no gait abnormality, no slurred speech, no confusion  Otherwise ROS are negative except for above, 10 systems were reviewed  Past Medical History: Past Medical History  Diagnosis Date  . Chronic kidney disease     esrd  . Diabetes mellitus   . Hyperlipidemia   . Hypertension   . Renal disorder   . Dialysis care     tues, thurs, sat  . Gout due to renal impairment    Past Surgical  History  Procedure Date  . Carotid endarterectomy 2002    left  . Btl   . Rectal fissurectomy   . Av fistula placement 10/30/2010    right brachiocephalic   . Fistulogram      Medications: Prior to Admission medications   Medication Sig Start Date End Date Taking? Authorizing Provider  acetaminophen (TYLENOL) 500 MG tablet Take 500 mg by mouth every 6 (six) hours as needed. For pain   Yes Historical Provider, MD  allopurinol (ZYLOPRIM) 100 MG tablet Take 100 mg by mouth daily.     Yes Historical Provider, MD  amLODipine (NORVASC) 5 MG tablet Take 5 mg by mouth at bedtime.    Yes Historical Provider, MD  calcium acetate (PHOSLO) 667 MG capsule Take 1,334 mg by mouth 3 (three) times daily with meals.    Yes Historical Provider, MD  Coenzyme Q10 150 MG CAPS Take 300 mg by mouth daily.    Yes Historical Provider, MD  colchicine 0.6 MG tablet Take 0.6 mg by mouth daily as needed. For gout.   Yes Historical Provider, MD  cyclobenzaprine (FLEXERIL) 10 MG tablet Take 10 mg by mouth 2 (two) times daily as needed. For muscle spasms   Yes Historical Provider, MD  docusate sodium (COLACE) 100 MG capsule Take 200 mg by mouth at bedtime.     Yes Historical Provider, MD  hydrALAZINE (APRESOLINE) 25 MG tablet Take 50 mg by mouth 3 (three) times daily.    Yes Historical Provider, MD  l-methylfolate-B6-B12 (METANX) 3-35-2 MG TABS Take 1 tablet by  mouth 2 (two) times daily.    Yes Historical Provider, MD  losartan (COZAAR) 50 MG tablet Take 50 mg by mouth at bedtime.    Yes Historical Provider, MD  metoprolol (TOPROL-XL) 50 MG 24 hr tablet Take 50 mg by mouth at bedtime.    Yes Historical Provider, MD  multivitamin (RENA-VIT) TABS tablet Take 1 tablet by mouth daily.     Yes Historical Provider, MD  OVER THE COUNTER MEDICATION Take 60-90 mLs by mouth daily. OTC. QiNopal   Yes Historical Provider, MD  repaglinide (PRANDIN) 0.5 MG tablet Take 0.5 mg by mouth 3 (three) times daily before meals.     Yes  Historical Provider, MD    Allergies:   Allergies  Allergen Reactions  . Codeine Other (See Comments)    Passes out  . Epinephrine Other (See Comments)    Tachycardia, diaphoresis, syncope  . Percocet (Oxycodone-Acetaminophen) Other (See Comments)    Passes  out    Social History:    reports that she has been smoking Cigarettes.  She has a 50 pack-year smoking history. She has never used smokeless tobacco. She reports that she does not drink alcohol or use illicit drugs.   Family History: family history includes COPD in her mother; Heart disease in her father; Kidney disease in her mother; and Lymphoma in her son.    Physical Exam: Patient Vitals for the past 24 hrs:  BP Temp Temp src Pulse Resp SpO2 Weight  07/03/11 2300 187/77 mmHg - - 92  18  - -  07/03/11 2230 161/68 mmHg - - 92  18  - -  07/03/11 2200 204/89 mmHg - - 86  20  - -  07/03/11 2130 204/88 mmHg - - 86  22  99 % -  07/03/11 2116 199/80 mmHg - - 93  20  - -  07/03/11 1845 204/82 mmHg 96.5 F (35.8 C) Oral 82  - - -  07/03/11 1437 202/76 mmHg 98 F (36.7 C) Oral 81  20  98 % 57.9 kg (127 lb 10.3 oz)    1. General:  in No Acute distress 2. Psychological: Alert and  Oriented 3. Head/ENT:   Moist Mucous Membranes                          Head Non traumatic, neck supple                          Normal  Dentition 4. SKIN: normal   Skin clean Dry and intact no rash 5. Heart: Regular rate and rhythm no Murmur, Rub or gallop 6. Lungs: Clear to auscultation bilaterally, no wheezes or crackles   7. Abdomen: Soft, non-tender, Non distended 8. Lower extremities: no clubbing, cyanosis, or edema 9. Neurologically Grossly intact, moving all 4 extremities equally 10. MSK: Normal range of motion  body mass index is unknown because there is no height on file.   Labs on Admission:   [facility] 07/03/11 2129 07/01/11 1631  NA 137 136  K 4.1 4.7  CL 96 96  CO2 27 25  GLUCOSE 101* 196*  BUN 57* 55*  CREATININE  8.93* 9.47*  CALCIUM 9.7 9.5  MG -- --  PHOS 2.9 2.6    Basename 07/03/11 2129 07/01/11 1631  AST -- --  ALT -- --  ALKPHOS -- --  BILITOT -- --  PROT -- --  ALBUMIN 3.4* 3.2*  No results found for this basename: LIPASE:2,AMYLASE:2 in the last 72 hours  Basename 07/03/11 2129 07/01/11 1638  WBC 7.3 6.8  NEUTROABS 4.0 4.2  HGB 11.8* 11.7*  HCT 37.0 36.8  MCV 86.0 85.2  PLT 206 257   No results found for this basename: CKTOTAL:3,CKMB:3,CKMBINDEX:3,TROPONINI:3 in the last 72 hours No results found for this basename: TSH,T4TOTAL,FREET3,T3FREE,THYROIDAB in the last 72 hours No results found for this basename: VITAMINB12:2,FOLATE:2,FERRITIN:2,TIBC:2,IRON:2,RETICCTPCT:2 in the last 72 hours Lab Results  Component Value Date   HGBA1C 5.4 05/16/2011    The CrCl is unknown because both a height and weight (above a minimum accepted value) are required for this calculation. ABG    Component Value Date/Time   TCO2 29 06/10/2010 0739     No results found for this basename: DDIMER      Cultures:    Component Value Date/Time   SDES URINE, CLEAN CATCH 08/20/2009 2114   SPECREQUEST NONE 08/20/2009 2114   CULT NO GROWTH 08/20/2009 2114   REPTSTATUS 08/22/2009 FINAL 08/20/2009 2114       Radiological Exams on Admission: No results found.  Assessment/Plan  66 yo here to get her HD  Present on Admission:  .ESRD (end stage renal disease) on dialysis - here to get her HD, patient already on the machine .Diabetes mellitus - SSI while here   Prophylaxis: SCD    I have spent a total of 35 min on this admission  Ramal Eckhardt 07/03/2011, 11:17 PM

## 2011-07-03 NOTE — ED Provider Notes (Signed)
Medical screening examination- pt comes for her dialysis session that she states was scheduled for 1:30pm today.  She has no current complaints.  She is awake and alert and eating lunch.  No acute distress.  Dr. Bascom Levels has written orders for her dialysis.  She will go to dialysis and then be discharged.  There are no other emergency medical conditions present that require intervention in the Emergency Department.   Ethelda Chick, MD 07/03/11 [phone]

## 2011-07-03 NOTE — Consult Note (Signed)
Pt. In for dialysis. Blood pressure elevated.see orders.

## 2011-07-03 NOTE — ED Notes (Signed)
Dialysis unit called; states that they are ready for patient.  Patient being prepared for transport.

## 2011-07-03 NOTE — ED Notes (Signed)
Dr. Bo Mcclintock called back and aware of bp; no  Orders received. Continue to monitor.

## 2011-07-03 NOTE — ED Notes (Signed)
Dr. Bascom Levels aware of elevated bp. Pt. Asymptomatic. Dr. Stann Mainland not treat high bp. Pt. Aware.

## 2011-07-03 NOTE — ED Notes (Signed)
Dr. Jeanene Erb re: elevated bp and asymptomatic.

## 2011-07-03 NOTE — ED Notes (Signed)
Pt. Here for dialysis  

## 2011-07-04 NOTE — Discharge Summary (Signed)
DISCHARGE SUMMARY  Alexandra [name]  MR#: [id number]  DOB:1945-04-18  Date of Admission: 06/23/2011 Date of Discharge: 06/23/2011  Attending Physician:Hector Venne  Patient's YNW:GNFA,[id number] [name], MD, MD  Consults: nephrology  Discharge Diagnoses: Present on Admission:  .Anemia of chronic disease .Diabetes mellitus .ESRD (end stage renal disease) on dialysis .HTN (hypertension), benign .Hyperlipidemia .Hyperparathyroidism, secondary renal    Medication List  As of 07/04/2011  6:45 PM   ASK your doctor about these medications         acetaminophen 500 MG tablet   Commonly known as: TYLENOL   Take 500 mg by mouth every 6 (six) hours as needed. For pain      allopurinol 100 MG tablet   Commonly known as: ZYLOPRIM   Take 100 mg by mouth daily.      amLODipine 5 MG tablet   Commonly known as: NORVASC   Take 5 mg by mouth at bedtime.      calcium acetate 667 MG capsule   Commonly known as: PHOSLO   Take 1,334 mg by mouth 3 (three) times daily with meals.      Coenzyme Q10 150 MG Caps   Take 300 mg by mouth daily.      colchicine 0.6 MG tablet   Take 0.6 mg by mouth daily as needed. For gout.      cyclobenzaprine 10 MG tablet   Commonly known as: FLEXERIL   Take 10 mg by mouth 2 (two) times daily as needed. For muscle spasms      docusate sodium 100 MG capsule   Commonly known as: COLACE   Take 200 mg by mouth at bedtime.      hydrALAZINE 25 MG tablet   Commonly known as: APRESOLINE   Take 50 mg by mouth 3 (three) times daily.      l-methylfolate-B6-B12 3-35-2 MG Tabs   Commonly known as: METANX   Take 1 tablet by mouth 2 (two) times daily.      losartan 50 MG tablet   Commonly known as: COZAAR   Take 50 mg by mouth at bedtime.      metoprolol succinate 50 MG 24 hr tablet   Commonly known as: TOPROL-XL   Take 50 mg by mouth at bedtime.      multivitamin Tabs tablet   Take 1 tablet by mouth daily.      OVER THE COUNTER MEDICATION   Take 60-90  mLs by mouth daily. OTC. QiNopal      repaglinide 0.5 MG tablet   Commonly known as: PRANDIN   Take 0.5 mg by mouth 3 (three) times daily before meals.              Hospital Course: Present on Admission:  .Anemia of chronic disease .Diabetes mellitus .ESRD (end stage renal disease) on dialysis .HTN (hypertension), benign .Hyperlipidemia .Hyperparathyroidism, secondary renal  This is a 66 y/o female admitted for routine hemodialysis. She was hemodialyzed and left before AM doctors were able to see patient. She was not seen by me after the admission process.  Day of Discharge 06/23/2010   No results found for this or any previous visit (from the past 24 hour(s)).  Disposition: home   Follow-up Appts:  Follow-up with nephrologist as scheduled    Signed: [name] 07/04/2011, 6:45 PM

## 2011-07-06 ENCOUNTER — Encounter (HOSPITAL_COMMUNITY): Payer: Self-pay | Admitting: *Deleted

## 2011-07-06 ENCOUNTER — Observation Stay (HOSPITAL_COMMUNITY)
Admission: EM | Admit: 2011-07-06 | Discharge: 2011-07-06 | Disposition: A | Discharge status: Home / Self Care | Payer: Medicare HMO | Attending: Emergency Medicine | Admitting: Emergency Medicine

## 2011-07-06 ENCOUNTER — Observation Stay (HOSPITAL_COMMUNITY): Payer: Medicare HMO

## 2011-07-06 DIAGNOSIS — I120 Hypertensive chronic kidney disease with stage 5 chronic kidney disease or end stage renal disease: Secondary | ICD-10-CM | POA: Insufficient documentation

## 2011-07-06 DIAGNOSIS — N186 End stage renal disease: Secondary | ICD-10-CM | POA: Insufficient documentation

## 2011-07-06 DIAGNOSIS — E785 Hyperlipidemia, unspecified: Secondary | ICD-10-CM | POA: Insufficient documentation

## 2011-07-06 DIAGNOSIS — Z992 Dependence on renal dialysis: Principal | ICD-10-CM | POA: Insufficient documentation

## 2011-07-06 DIAGNOSIS — E119 Type 2 diabetes mellitus without complications: Secondary | ICD-10-CM | POA: Insufficient documentation

## 2011-07-06 LAB — GLUCOSE, CAPILLARY
Glucose-Capillary: 134 mg/dL — ABNORMAL HIGH (ref 70–99)
Glucose-Capillary: 103 mg/dL — ABNORMAL HIGH (ref 70–99)

## 2011-07-06 LAB — RENAL FUNCTION PANEL
Albumin: 3.7 g/dL (ref 3.5–5.2)
BUN: 69 mg/dL — ABNORMAL HIGH (ref 6–23)
Chloride: 92 mEq/L — ABNORMAL LOW (ref 96–112)
Creatinine, Ser: 10.38 mg/dL — ABNORMAL HIGH (ref 0.50–1.10)

## 2011-07-06 LAB — CBC
HCT: 36.7 % (ref 36.0–46.0)
MCH: 27.3 pg (ref 26.0–34.0)
MCV: 85.7 fL (ref 78.0–100.0)
RDW: 21.4 % — ABNORMAL HIGH (ref 11.5–15.5)
WBC: 6.6 10*3/uL (ref 4.0–10.5)

## 2011-07-06 MED ORDER — PARICALCITOL 5 MCG/ML IV SOLN
1.0000 ug | Freq: Once | INTRAVENOUS | Status: AC
  Administered 2011-07-06: 1 ug via INTRAVENOUS

## 2011-07-06 MED ORDER — INSULIN ASPART 100 UNIT/ML ~~LOC~~ SOLN: 0.0000 [IU] | Freq: Three times a day (TID) | SUBCUTANEOUS | Status: DC

## 2011-07-06 MED ORDER — HEPARIN SODIUM (PORCINE) 1000 UNIT/ML DIALYSIS
20.0000 [IU]/kg | INTRAMUSCULAR | Status: DC | PRN
  Administered 2011-07-06: 1100 [IU] via INTRAVENOUS_CENTRAL
  Filled 2011-07-06: qty 2

## 2011-07-06 MED ORDER — PARICALCITOL 5 MCG/ML IV SOLN
INTRAVENOUS | Status: AC
  Administered 2011-07-06: 1 ug via INTRAVENOUS
  Filled 2011-07-06: qty 1

## 2011-07-06 NOTE — Consult Note (Signed)
Pt.in ER dialysis orders

## 2011-07-06 NOTE — ED Notes (Signed)
The pt is here to be dialyzed

## 2011-07-06 NOTE — ED Provider Notes (Signed)
History     CSN: [id number]  Arrival date & time 07/06/11  1543   First MD Initiated Contact with Patient 07/06/11 1614      Chief Complaint  Patient presents with  . here for dialysis     (Consider location/radiation/quality/duration/timing/severity/associated sxs/prior treatment) HPI Comments: Patient presented for her regular dialysis session. Last dialyzed 2 days ago. No chest pain, shortness of breath, cough, fever, nausea or vomiting. Nothing makes her symptoms better or worse.  The history is provided by the patient.    Past Medical History  Diagnosis Date  . Chronic kidney disease     esrd  . Diabetes mellitus   . Hyperlipidemia   . Hypertension   . Renal disorder   . Dialysis care     tues, thurs, sat  . 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 Carotid endarterectomy 2002    left  . Btl   . Rectal fissurectomy   . Av fistula placement 10/30/2010    right brachiocephalic   . Fistulogram     Family History  Problem Relation Age of Onset  . COPD Mother   . Kidney disease Mother   . Heart disease Father   . Lymphoma Son     History  Substance Use Topics  . Smoking status: Current Everyday Smoker -- 1.0 packs/day for 50 years    Types: Cigarettes  . Smokeless tobacco: Never Used  . Alcohol Use: No    OB History    Grav Para Term Preterm Abortions TAB SAB Ect Mult Living                  Review of Systems  Constitutional: Negative for fever, activity change and appetite change.  HENT: Negative for congestion and rhinorrhea.   Respiratory: Negative for cough, chest tightness and shortness of breath.   Cardiovascular: Negative for chest pain.  Genitourinary: Negative for dysuria and hematuria.  Musculoskeletal: Negative for back pain.  Neurological: Negative for headaches.    Allergies  Codeine; Epinephrine; and Percocet  Home Medications   Current Outpatient Rx  Name Route Sig Dispense Refill  .  ACETAMINOPHEN 500 MG PO TABS Oral Take 500 mg by mouth every 6 (six) hours as needed. For pain    . ALLOPURINOL 100 MG PO TABS Oral Take 100 mg by mouth daily.      Marland Kitchen AMLODIPINE BESYLATE 5 MG PO TABS Oral Take 5 mg by mouth at bedtime.     Marland Kitchen CALCIUM ACETATE 667 MG PO CAPS Oral Take 1,334 mg by mouth 3 (three) times daily with meals.     Marland Kitchen COENZYME Q10 150 MG PO CAPS Oral Take 300 mg by mouth daily.     . COLCHICINE 0.6 MG PO TABS Oral Take 0.6 mg by mouth daily as needed. For gout.    . CYCLOBENZAPRINE HCL 10 MG PO TABS Oral Take 10 mg by mouth 2 (two) times daily as needed. For muscle spasms    . DOCUSATE SODIUM 100 MG PO CAPS Oral Take 200 mg by mouth at bedtime.      Marland Kitchen HYDRALAZINE HCL 25 MG PO TABS Oral Take 50 mg by mouth 3 (three) times daily.     . L-METHYLFOLATE-B6-B12 3-35-2 MG PO TABS Oral Take 1 tablet by mouth 2 (two) times daily.     Marland Kitchen LOSARTAN POTASSIUM 50 MG PO TABS Oral Take 50 mg by mouth at bedtime.     Marland Kitchen  METOPROLOL SUCCINATE ER 50 MG PO TB24 Oral Take 50 mg by mouth at bedtime.     Marland Kitchen RENA-VITE PO TABS Oral Take 1 tablet by mouth daily.      Marland Kitchen OVER THE COUNTER MEDICATION Oral Take 60-90 mLs by mouth daily. OTC. QiNopal    . REPAGLINIDE 0.5 MG PO TABS Oral Take 0.5 mg by mouth 3 (three) times daily before meals.        BP 178/51  Pulse 96  Temp(Src) 97.8 F (36.6 C) (Oral)  Resp 16  SpO2 97%  Physical Exam  Constitutional: She is oriented to person, place, and time. She appears well-developed and well-nourished. No distress.  HENT:  Head: Normocephalic and atraumatic.  Mouth/Throat: Oropharynx is clear and moist.  Eyes: Conjunctivae and EOM are normal. Pupils are equal, round, and reactive to light.  Neck: Normal range of motion. Neck supple.  Cardiovascular: Normal rate, regular rhythm and normal heart sounds.   Pulmonary/Chest: Effort normal and breath sounds normal. No respiratory distress.  Abdominal: Soft. There is no tenderness. There is no rebound and no  guarding.  Musculoskeletal: Normal range of motion. She exhibits no edema and no tenderness.  Neurological: She is alert and oriented to person, place, and time. No cranial nerve deficit.  Skin: Skin is warm.    ED Course  Procedures (including critical care time)  Labs Reviewed  GLUCOSE, CAPILLARY - Abnormal; Notable for the following:    Glucose-Capillary 134 (*)    All other components within normal limits   No results found.   1. ESRD (end stage renal disease)       MDM  Presented for regular dialysis session. Vitals stable, no evidence of volume overload. No chest pain or shortness of breath Dr. Bascom Levels consulted for dialysis orders        Glynn Octave, MD 07/06/11 1742

## 2011-07-06 NOTE — ED Notes (Signed)
Called dialysis.  RN will call when ready for pt

## 2011-07-06 NOTE — H&P (Addendum)
History and Physical  Alexandra [name] UEA:[id number] DOB: 1946/01/26 DOA: 07/06/2011  Referring physician: Dr. Manus Gunning PCP: [name], MD, MD   Chief Complaint: Here for dialysis  HPI:  66 year old woman presents for hemodialysis. No complaints. Emergency department evaluation unremarkable.  Review of Systems:  She denies other complaints. "I am fine". Past Medical History  Diagnosis Date  . Chronic kidney disease     esrd  . Diabetes mellitus   . Hyperlipidemia   . Hypertension   . Renal disorder   . Dialysis care     tues, thurs, sat  . Gout due to renal impairment    Past Surgical History  Procedure Date  . Carotid endarterectomy 2002    left  . Btl   . Rectal fissurectomy   . Av fistula placement 10/30/2010    right brachiocephalic   . Fistulogram    Social History:  reports that she has been smoking Cigarettes.  She has a 50 pack-year smoking history. She has never used smokeless tobacco. She reports that she does not drink alcohol or use illicit drugs.  Allergies  Allergen Reactions  . Codeine Other (See Comments)    Passes out  . Epinephrine Other (See Comments)    Tachycardia, diaphoresis, syncope  . Percocet (Oxycodone-Acetaminophen) Other (See Comments)    Passes  out    Family History  Problem Relation Age of Onset  . COPD Mother   . Kidney disease Mother   . Heart disease Father   . Lymphoma Son     Prior to Admission medications   Medication Sig Start Date End Date Taking? Authorizing Provider  acetaminophen (TYLENOL) 500 MG tablet Take 500 mg by mouth every 6 (six) hours as needed. For pain   Yes Historical Provider, MD  allopurinol (ZYLOPRIM) 100 MG tablet Take 100 mg by mouth daily.     Yes Historical Provider, MD  amLODipine (NORVASC) 5 MG tablet Take 5 mg by mouth at bedtime.    Yes Historical Provider, MD  calcium acetate (PHOSLO) 667 MG capsule Take 1,334 mg by mouth 3 (three) times daily with meals.    Yes Historical Provider,  MD  Coenzyme Q10 150 MG CAPS Take 300 mg by mouth daily.    Yes Historical Provider, MD  colchicine 0.6 MG tablet Take 0.6 mg by mouth daily as needed. For gout.   Yes Historical Provider, MD  cyclobenzaprine (FLEXERIL) 10 MG tablet Take 10 mg by mouth 2 (two) times daily as needed. For muscle spasms   Yes Historical Provider, MD  docusate sodium (COLACE) 100 MG capsule Take 200 mg by mouth at bedtime.     Yes Historical Provider, MD  hydrALAZINE (APRESOLINE) 25 MG tablet Take 50 mg by mouth 3 (three) times daily.    Yes Historical Provider, MD  l-methylfolate-B6-B12 (METANX) 3-35-2 MG TABS Take 1 tablet by mouth 2 (two) times daily.    Yes Historical Provider, MD  losartan (COZAAR) 50 MG tablet Take 50 mg by mouth at bedtime.    Yes Historical Provider, MD  metoprolol (TOPROL-XL) 50 MG 24 hr tablet Take 50 mg by mouth at bedtime.    Yes Historical Provider, MD  multivitamin (RENA-VIT) TABS tablet Take 1 tablet by mouth daily.     Yes Historical Provider, MD  OVER THE COUNTER MEDICATION Take 60-90 mLs by mouth daily. OTC. QiNopal   Yes Historical Provider, MD  repaglinide (PRANDIN) 0.5 MG tablet Take 0.5 mg by mouth 3 (three) times daily before meals.  Yes Historical Provider, MD   Physical Exam: Filed Vitals:   07/06/11 1557  BP: 178/51  Pulse: 96  Temp: 97.8 F (36.6 C)  TempSrc: Oral  Resp: 16  SpO2: 97%     General:  Appears calm and comfortable. Sitting in chair reading the paper.  Cardiovascular: Regular rate and rhythm. No murmur, rub, gallop. No lower extremity edema.  Respiratory: Clear to auscultation bilaterally. No wheezes, rales, rhonchi. Normal respiratory effort.  Labs on Admission:  Basic Metabolic Panel:  Lab 07/03/11 1191 07/01/11 1631  NA 137 136  K 4.1 4.7  CL 96 96  CO2 27 25  GLUCOSE 101* 196*  BUN 57* 55*  CREATININE 8.93* 9.47*  CALCIUM 9.7 9.5  MG -- --  PHOS 2.9 2.6   Liver Function Tests:  Lab 07/03/11 2129 07/01/11 1631  AST -- --  ALT  -- --  ALKPHOS -- --  BILITOT -- --  PROT -- --  ALBUMIN 3.4* 3.2*   CBC:  Lab 07/03/11 2129 07/01/11 1638  WBC 7.3 6.8  NEUTROABS 4.0 4.2  HGB 11.8* 11.7*  HCT 37.0 36.8  MCV 86.0 85.2  PLT 206 257   CBG:  Lab 07/06/11 1621 07/03/11 1850 07/01/11 1539 06/29/11 1845  GLUCAP 134* 117* 184* 108*    Assessment/Plan 1. End-stage renal disease: Hemodialysis per nephrology. 2. Diabetes mellitus: Stable. Sliding scale insulin.  Code Status: Full code  Disposition Plan: Pending further treatment.  Brendia Sacks, MD  [facility] Pager [phone]  If 8PM-8AM, please contact floor/night-coverage [email or web address] Password [facility] 07/06/2011, 5:38 PM

## 2011-07-06 NOTE — ED Notes (Signed)
Dialysis RN at bedside speaking to pt.

## 2011-07-06 NOTE — ED Notes (Signed)
Dr. Bascom Levels notified of pts arrival.

## 2011-07-06 NOTE — ED Notes (Signed)
Renal diet with peaches ordered

## 2011-07-06 NOTE — ED Notes (Signed)
Pt sitting in chair in trt room eating dinner.

## 2011-07-06 NOTE — ED Notes (Signed)
Pt continues sitting in chair in trt room speaking to dialysis RN.

## 2011-07-08 ENCOUNTER — Observation Stay (HOSPITAL_COMMUNITY)
Admission: EM | Admit: 2011-07-08 | Discharge: 2011-07-08 | Disposition: A | Discharge status: Home / Self Care | Payer: Medicare HMO | Attending: Internal Medicine | Admitting: Internal Medicine

## 2011-07-08 ENCOUNTER — Observation Stay (HOSPITAL_COMMUNITY): Payer: Medicare HMO

## 2011-07-08 DIAGNOSIS — E119 Type 2 diabetes mellitus without complications: Secondary | ICD-10-CM | POA: Insufficient documentation

## 2011-07-08 DIAGNOSIS — N186 End stage renal disease: Secondary | ICD-10-CM | POA: Insufficient documentation

## 2011-07-08 DIAGNOSIS — I120 Hypertensive chronic kidney disease with stage 5 chronic kidney disease or end stage renal disease: Secondary | ICD-10-CM | POA: Insufficient documentation

## 2011-07-08 DIAGNOSIS — Z992 Dependence on renal dialysis: Principal | ICD-10-CM | POA: Insufficient documentation

## 2011-07-08 DIAGNOSIS — N19 Unspecified kidney failure: Secondary | ICD-10-CM

## 2011-07-08 LAB — CBC
Platelets: 187 10*3/uL (ref 150–400)
RBC: 4.08 MIL/uL (ref 3.87–5.11)
RDW: 20.8 % — ABNORMAL HIGH (ref 11.5–15.5)
WBC: 6.6 10*3/uL (ref 4.0–10.5)

## 2011-07-08 LAB — CREATININE, SERUM
Creatinine, Ser: 7.85 mg/dL — ABNORMAL HIGH (ref 0.50–1.10)
GFR calc Af Amer: 5 mL/min — ABNORMAL LOW (ref >90)

## 2011-07-08 LAB — GLUCOSE, CAPILLARY: Glucose-Capillary: 177 mg/dL — ABNORMAL HIGH (ref 70–99)

## 2011-07-08 MED ORDER — CYCLOBENZAPRINE HCL 10 MG PO TABS: 10.0000 mg | ORAL_TABLET | Freq: Two times a day (BID) | ORAL | Status: DC | PRN

## 2011-07-08 MED ORDER — REPAGLINIDE 0.5 MG PO TABS
0.5000 mg | ORAL_TABLET | Freq: Three times a day (TID) | ORAL | Status: DC
  Filled 2011-07-08 (×2): qty 1

## 2011-07-08 MED ORDER — COLCHICINE 0.6 MG PO TABS
0.6000 mg | ORAL_TABLET | Freq: Every day | ORAL | Status: DC | PRN
  Filled 2011-07-08: qty 1

## 2011-07-08 MED ORDER — L-METHYLFOLATE-B6-B12 3-35-2 MG PO TABS
1.0000 | ORAL_TABLET | Freq: Two times a day (BID) | ORAL | Status: DC
  Filled 2011-07-08: qty 1

## 2011-07-08 MED ORDER — PARICALCITOL 5 MCG/ML IV SOLN
1.0000 ug | Freq: Once | INTRAVENOUS | Status: DC
Start: 2011-07-08 — End: 2011-07-09
  Filled 2011-07-08: qty 0.2

## 2011-07-08 MED ORDER — ONDANSETRON HCL 8 MG PO TABS: 4.0000 mg | ORAL_TABLET | Freq: Four times a day (QID) | ORAL | Status: DC | PRN

## 2011-07-08 MED ORDER — AMLODIPINE BESYLATE 5 MG PO TABS
5.0000 mg | ORAL_TABLET | Freq: Every day | ORAL | Status: DC
  Filled 2011-07-08: qty 1

## 2011-07-08 MED ORDER — DARBEPOETIN ALFA-POLYSORBATE 200 MCG/0.4ML IJ SOLN
INTRAMUSCULAR | Status: AC
  Filled 2011-07-08: qty 0.4

## 2011-07-08 MED ORDER — CALCIUM ACETATE 667 MG PO CAPS
1334.0000 mg | ORAL_CAPSULE | Freq: Three times a day (TID) | ORAL | Status: DC
  Filled 2011-07-08 (×2): qty 2

## 2011-07-08 MED ORDER — RENA-VITE PO TABS
1.0000 | ORAL_TABLET | Freq: Every day | ORAL | Status: DC
  Filled 2011-07-08: qty 1

## 2011-07-08 MED ORDER — ONDANSETRON HCL 4 MG/2ML IJ SOLN: 4.0000 mg | Freq: Four times a day (QID) | INTRAMUSCULAR | Status: DC | PRN

## 2011-07-08 MED ORDER — PARICALCITOL 5 MCG/ML IV SOLN
INTRAVENOUS | Status: AC
Start: 2011-07-08 — End: 2011-07-08
  Administered 2011-07-08: 1 ug
  Filled 2011-07-08: qty 1

## 2011-07-08 MED ORDER — ALLOPURINOL 100 MG PO TABS
100.0000 mg | ORAL_TABLET | Freq: Every day | ORAL | Status: DC
  Filled 2011-07-08: qty 1

## 2011-07-08 MED ORDER — HYDRALAZINE HCL 50 MG PO TABS
50.0000 mg | ORAL_TABLET | Freq: Three times a day (TID) | ORAL | Status: DC
  Filled 2011-07-08 (×2): qty 1

## 2011-07-08 MED ORDER — DARBEPOETIN ALFA-POLYSORBATE 200 MCG/0.4ML IJ SOLN
200.0000 ug | Freq: Once | INTRAMUSCULAR | Status: AC
  Administered 2011-07-08: 200 ug via INTRAVENOUS

## 2011-07-08 MED ORDER — HEPARIN SODIUM (PORCINE) 1000 UNIT/ML DIALYSIS
20.0000 [IU]/kg | INTRAMUSCULAR | Status: DC | PRN
  Filled 2011-07-08: qty 2

## 2011-07-08 MED ORDER — METOPROLOL SUCCINATE ER 50 MG PO TB24
50.0000 mg | ORAL_TABLET | Freq: Every day | ORAL | Status: DC
  Filled 2011-07-08: qty 1

## 2011-07-08 MED ORDER — HEPARIN SODIUM (PORCINE) 5000 UNIT/ML IJ SOLN: 5000.0000 [IU] | Freq: Three times a day (TID) | INTRAMUSCULAR | Status: DC

## 2011-07-08 MED ORDER — ACETAMINOPHEN 325 MG PO TABS: 650.0000 mg | ORAL_TABLET | Freq: Four times a day (QID) | ORAL | Status: DC | PRN

## 2011-07-08 MED ORDER — LOSARTAN POTASSIUM 50 MG PO TABS
50.0000 mg | ORAL_TABLET | Freq: Every day | ORAL | Status: DC
  Filled 2011-07-08: qty 1

## 2011-07-08 MED ORDER — DOCUSATE SODIUM 100 MG PO CAPS
200.0000 mg | ORAL_CAPSULE | Freq: Every day | ORAL | Status: DC
  Filled 2011-07-08: qty 2

## 2011-07-08 NOTE — ED Notes (Signed)
Dr. Bascom Levels paged, informed & aware pt here in ED & awaiting dialysis orders

## 2011-07-08 NOTE — ED Notes (Signed)
Dialysis notified pt has arrived

## 2011-07-08 NOTE — Consult Note (Signed)
  Pt. In ER she needs dialysis. 

## 2011-07-08 NOTE — ED Notes (Signed)
Pt here for routine dialysis; pt last dialysis was on Tuesday

## 2011-07-08 NOTE — H&P (Signed)
PCP:   [name], MD, MD   Chief Complaint:  Here for dialysis.  HPI: [name] is here for dialysis. She has no other complaints.  Review of Systems:  The patient denies anorexia, fever, weight loss,, vision loss, decreased hearing, hoarseness, chest pain, syncope, dyspnea on exertion, peripheral edema, balance deficits, hemoptysis, abdominal pain, melena, hematochezia, severe indigestion/heartburn, hematuria, incontinence, genital sores, muscle weakness, suspicious skin lesions, transient blindness, difficulty walking, depression, unusual weight change, abnormal bleeding, enlarged lymph nodes, angioedema, and breast masses.  Past Medical History: Past Medical History  Diagnosis Date  . Chronic kidney disease     esrd  . Diabetes mellitus   . Hyperlipidemia   . Hypertension   . Renal disorder   . Dialysis care     tues, thurs, sat  . Gout due to renal impairment    Past Surgical History  Procedure Date  . Carotid endarterectomy 2002    left  . Btl   . Rectal fissurectomy   . Av fistula placement 10/30/2010    right brachiocephalic   . Fistulogram     Medications: Prior to Admission medications   Medication Sig Start Date End Date Taking? Authorizing Provider  acetaminophen (TYLENOL) 500 MG tablet Take 500 mg by mouth every 6 (six) hours as needed. For pain   Yes Historical Provider, MD  allopurinol (ZYLOPRIM) 100 MG tablet Take 100 mg by mouth daily.     Yes Historical Provider, MD  amLODipine (NORVASC) 5 MG tablet Take 5 mg by mouth at bedtime.    Yes Historical Provider, MD  calcium acetate (PHOSLO) 667 MG capsule Take 1,334 mg by mouth 3 (three) times daily with meals.    Yes Historical Provider, MD  Coenzyme Q10 150 MG CAPS Take 300 mg by mouth daily.    Yes Historical Provider, MD  colchicine 0.6 MG tablet Take 0.6 mg by mouth daily as needed. For gout.   Yes Historical Provider, MD  cyclobenzaprine (FLEXERIL) 10 MG tablet Take 10 mg by mouth 2 (two) times  daily as needed. For muscle spasms   Yes Historical Provider, MD  docusate sodium (COLACE) 100 MG capsule Take 200 mg by mouth at bedtime.     Yes Historical Provider, MD  hydrALAZINE (APRESOLINE) 25 MG tablet Take 50 mg by mouth 3 (three) times daily.    Yes Historical Provider, MD  l-methylfolate-B6-B12 (METANX) 3-35-2 MG TABS Take 1 tablet by mouth 2 (two) times daily.    Yes Historical Provider, MD  losartan (COZAAR) 50 MG tablet Take 50 mg by mouth at bedtime.    Yes Historical Provider, MD  metoprolol (TOPROL-XL) 50 MG 24 hr tablet Take 50 mg by mouth at bedtime.    Yes Historical Provider, MD  multivitamin (RENA-VIT) TABS tablet Take 1 tablet by mouth daily.     Yes Historical Provider, MD  OVER THE COUNTER MEDICATION Take 60-90 mLs by mouth daily. OTC. QiNopal   Yes Historical Provider, MD  repaglinide (PRANDIN) 0.5 MG tablet Take 0.5 mg by mouth 3 (three) times daily before meals.     Yes Historical Provider, MD    Allergies:   Allergies  Allergen Reactions  . Codeine Other (See Comments)    Passes out  . Epinephrine Other (See Comments)    Tachycardia, diaphoresis, syncope  . Percocet (Oxycodone-Acetaminophen) Other (See Comments)    Passes  out    Social History:  reports that she has been smoking Cigarettes.  She has a 50 pack-year smoking  history. She has never used smokeless tobacco. She reports that she does not drink alcohol or use illicit drugs.  Family History: Family History  Problem Relation Age of Onset  . COPD Mother   . Kidney disease Mother   . Heart disease Father   . Lymphoma Son     Physical Exam: Filed Vitals:   07/08/11 1156  BP: 172/63  Pulse: 87  Resp: 16  SpO2: 98%   Sitting up in chair, not in distress. Very pleasant. Lungs clear. S1S2 heard. No murmurs. RRR. Abdomen- soft, non tender. +BS. CNS- grossly intact. Extremities- no pedal edema.   Labs on Admission:   [facility] 07/06/11 2001  NA 136  K 3.9  CL 92*  CO2 26  GLUCOSE 153*    BUN 69*  CREATININE 10.38*  CALCIUM 9.8  MG --  PHOS 3.8    Basename 07/06/11 2001  AST --  ALT --  ALKPHOS --  BILITOT --  PROT --  ALBUMIN 3.7   No results found for this basename: LIPASE:2,AMYLASE:2 in the last 72 hours  Basename 07/06/11 2001  WBC 6.6  NEUTROABS --  HGB 11.7*  HCT 36.7  MCV 85.7  PLT 216   No results found for this basename: CKTOTAL:3,CKMB:3,CKMBINDEX:3,TROPONINI:3 in the last 72 hours No results found for this basename: TSH,T4TOTAL,FREET3,T3FREE,THYROIDAB in the last 72 hours No results found for this basename: VITAMINB12:2,FOLATE:2,FERRITIN:2,TIBC:2,IRON:2,RETICCTPCT:2 in the last 72 hours  Radiological Exams on Admission: No results found.  Assessment ESRD lady for dialysis. Plan 1. ESRD on HD- admit obs. HD orders per Dr Bascom Levels. 2. Htn/gout- stable. Continue home meds. 3. Likely d/c after HD.  Alexandra [name] [phone] 07/08/2011, 2:21 PM

## 2011-07-08 NOTE — ED Notes (Signed)
Hospitalist MD at bedside. 

## 2011-07-08 NOTE — ED Notes (Signed)
Report given to dialysis. Pt refused stretcher for transport. Wants to walk. Accompanied by tech.

## 2011-07-08 NOTE — ED Notes (Addendum)
Awaiting to be dialyzed. Dr. Bascom Levels paged & hemodialysis informed. Diet tray ordered.

## 2011-07-10 ENCOUNTER — Observation Stay (HOSPITAL_COMMUNITY): Payer: Medicare HMO

## 2011-07-10 ENCOUNTER — Observation Stay (HOSPITAL_COMMUNITY)
Admission: EM | Admit: 2011-07-10 | Discharge: 2011-07-10 | Disposition: A | Discharge status: Home / Self Care | Payer: Medicare HMO | Attending: Internal Medicine | Admitting: Internal Medicine

## 2011-07-10 ENCOUNTER — Encounter (HOSPITAL_COMMUNITY): Payer: Self-pay | Admitting: Emergency Medicine

## 2011-07-10 DIAGNOSIS — N186 End stage renal disease: Secondary | ICD-10-CM | POA: Insufficient documentation

## 2011-07-10 DIAGNOSIS — E785 Hyperlipidemia, unspecified: Secondary | ICD-10-CM | POA: Insufficient documentation

## 2011-07-10 DIAGNOSIS — I120 Hypertensive chronic kidney disease with stage 5 chronic kidney disease or end stage renal disease: Secondary | ICD-10-CM | POA: Insufficient documentation

## 2011-07-10 DIAGNOSIS — Z992 Dependence on renal dialysis: Principal | ICD-10-CM | POA: Insufficient documentation

## 2011-07-10 DIAGNOSIS — E119 Type 2 diabetes mellitus without complications: Secondary | ICD-10-CM | POA: Insufficient documentation

## 2011-07-10 LAB — BASIC METABOLIC PANEL
CO2: 27 mEq/L (ref 19–32)
Chloride: 100 mEq/L (ref 96–112)
Sodium: 140 mEq/L (ref 135–145)

## 2011-07-10 LAB — MAGNESIUM: Magnesium: 2.2 mg/dL (ref 1.5–2.5)

## 2011-07-10 LAB — PHOSPHORUS: Phosphorus: 2.6 mg/dL (ref 2.3–4.6)

## 2011-07-10 LAB — CBC
Platelets: 208 10*3/uL (ref 150–400)
RBC: 3.96 MIL/uL (ref 3.87–5.11)
WBC: 7.6 10*3/uL (ref 4.0–10.5)

## 2011-07-10 LAB — GLUCOSE, CAPILLARY: Glucose-Capillary: 139 mg/dL — ABNORMAL HIGH (ref 70–99)

## 2011-07-10 MED ORDER — HEPARIN SODIUM (PORCINE) 1000 UNIT/ML DIALYSIS: 20.0000 [IU]/kg | INTRAMUSCULAR | Status: DC | PRN

## 2011-07-10 MED ORDER — ACETAMINOPHEN 500 MG PO TABS: 500.0000 mg | ORAL_TABLET | Freq: Four times a day (QID) | ORAL | Status: DC | PRN

## 2011-07-10 MED ORDER — DOCUSATE SODIUM 100 MG PO CAPS: 200.0000 mg | ORAL_CAPSULE | Freq: Every day | ORAL | Status: DC

## 2011-07-10 MED ORDER — CALCIUM ACETATE 667 MG PO CAPS: 1334.0000 mg | ORAL_CAPSULE | Freq: Three times a day (TID) | ORAL | Status: DC

## 2011-07-10 MED ORDER — HYDRALAZINE HCL 50 MG PO TABS: 50.0000 mg | ORAL_TABLET | Freq: Three times a day (TID) | ORAL | Status: DC

## 2011-07-10 MED ORDER — REPAGLINIDE 0.5 MG PO TABS: 0.5000 mg | ORAL_TABLET | Freq: Three times a day (TID) | ORAL | Status: DC

## 2011-07-10 MED ORDER — METOPROLOL SUCCINATE ER 50 MG PO TB24: 50.0000 mg | ORAL_TABLET | Freq: Every day | ORAL | Status: DC

## 2011-07-10 MED ORDER — AMLODIPINE BESYLATE 5 MG PO TABS: 5.0000 mg | ORAL_TABLET | Freq: Every day | ORAL | Status: DC

## 2011-07-10 MED ORDER — LOSARTAN POTASSIUM 50 MG PO TABS: 50.0000 mg | ORAL_TABLET | Freq: Every day | ORAL | Status: DC

## 2011-07-10 MED ORDER — COLCHICINE 0.6 MG PO TABS: 0.6000 mg | ORAL_TABLET | Freq: Every day | ORAL | Status: DC | PRN

## 2011-07-10 MED ORDER — HEPARIN SODIUM (PORCINE) 5000 UNIT/ML IJ SOLN: 5000.0000 [IU] | Freq: Three times a day (TID) | INTRAMUSCULAR | Status: DC

## 2011-07-10 MED ORDER — L-METHYLFOLATE-B6-B12 3-35-2 MG PO TABS: 1.0000 | ORAL_TABLET | Freq: Two times a day (BID) | ORAL | Status: DC

## 2011-07-10 MED ORDER — ALLOPURINOL 100 MG PO TABS: 100.0000 mg | ORAL_TABLET | Freq: Every day | ORAL | Status: DC

## 2011-07-10 MED ORDER — RENA-VITE PO TABS: 1.0000 | ORAL_TABLET | Freq: Every day | ORAL | Status: DC

## 2011-07-10 MED ORDER — CYCLOBENZAPRINE HCL 10 MG PO TABS: 10.0000 mg | ORAL_TABLET | Freq: Two times a day (BID) | ORAL | Status: DC | PRN

## 2011-07-10 NOTE — ED Notes (Signed)
Pt taken to dialysis 

## 2011-07-10 NOTE — ED Notes (Signed)
Ordered diet tray 

## 2011-07-10 NOTE — ED Provider Notes (Signed)
History     CSN: [id number]  Arrival date & time 07/10/11  1208   First MD Initiated Contact with Patient 07/10/11 1226      Chief Complaint  Patient presents with  . Vascular Access Problem    (Consider location/radiation/quality/duration/timing/severity/associated sxs/prior treatment) The history is provided by the patient.  patient presents for dialysis for her chronic kidney disease. She has no available outpatient dialysis unit at this time. Hospital care coordinators are working on finding an outpatient dialysis site for her. She comes in for her routine dialysis.she states she has a cough, but she's had for a while and is improving. There is less production then there used to be. She does not feel fluid overloaded.  Past Medical History  Diagnosis Date  . Chronic kidney disease     esrd  . Diabetes mellitus   . Hyperlipidemia   . Hypertension   . Renal disorder   . Dialysis care     tues, thurs, sat  . 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 Carotid endarterectomy 2002    left  . Btl   . Rectal fissurectomy   . Av fistula placement 10/30/2010    right brachiocephalic   . Fistulogram     Family History  Problem Relation Age of Onset  . COPD Mother   . Kidney disease Mother   . Heart disease Father   . Lymphoma Son     History  Substance Use Topics  . Smoking status: Current Everyday Smoker -- 1.0 packs/day for 50 years    Types: Cigarettes  . Smokeless tobacco: Never Used  . Alcohol Use: No    OB History    Grav Para Term Preterm Abortions TAB SAB Ect Mult Living                  Review of Systems  Constitutional: Negative for fever and chills.  Respiratory: Positive for cough. Negative for chest tightness.   Cardiovascular: Negative for chest pain and leg swelling.  Musculoskeletal: Negative for back pain.  Psychiatric/Behavioral: Negative for confusion.    Allergies  Codeine; Epinephrine; and  Percocet  Home Medications   Current Outpatient Rx  Name Route Sig Dispense Refill  . ACETAMINOPHEN 500 MG PO TABS Oral Take 500 mg by mouth every 6 (six) hours as needed. For pain    . ALLOPURINOL 100 MG PO TABS Oral Take 100 mg by mouth daily.      Marland Kitchen AMLODIPINE BESYLATE 5 MG PO TABS Oral Take 5 mg by mouth at bedtime.     Marland Kitchen CALCIUM ACETATE 667 MG PO CAPS Oral Take 1,334 mg by mouth 3 (three) times daily with meals.     Marland Kitchen COENZYME Q10 150 MG PO CAPS Oral Take 300 mg by mouth daily.     . COLCHICINE 0.6 MG PO TABS Oral Take 0.6 mg by mouth daily as needed. For gout.    . CYCLOBENZAPRINE HCL 10 MG PO TABS Oral Take 10 mg by mouth 2 (two) times daily as needed. For muscle spasms    . DOCUSATE SODIUM 100 MG PO CAPS Oral Take 200 mg by mouth at bedtime.      Marland Kitchen HYDRALAZINE HCL 25 MG PO TABS Oral Take 50 mg by mouth 3 (three) times daily.     . L-METHYLFOLATE-B6-B12 3-35-2 MG PO TABS Oral Take 1 tablet by mouth 2 (two) times daily.     Marland Kitchen LOSARTAN  POTASSIUM 50 MG PO TABS Oral Take 50 mg by mouth at bedtime.     Marland Kitchen METOPROLOL SUCCINATE ER 50 MG PO TB24 Oral Take 50 mg by mouth at bedtime.     Marland Kitchen RENA-VITE PO TABS Oral Take 1 tablet by mouth daily.      Marland Kitchen OVER THE COUNTER MEDICATION Oral Take 60-90 mLs by mouth daily. OTC. QiNopal    . REPAGLINIDE 0.5 MG PO TABS Oral Take 0.5 mg by mouth 3 (three) times daily before meals.        BP 191/66  Pulse 98  Temp(Src) 97.4 F (36.3 C) (Oral)  Resp 18  SpO2 94%  Physical Exam  Nursing note and vitals reviewed. Constitutional: She is oriented to person, place, and time. She appears well-developed and well-nourished.  HENT:  Head: Normocephalic and atraumatic.  Eyes: EOM are normal. Pupils are equal, round, and reactive to light.  Neck: Normal range of motion. Neck supple.  Cardiovascular: Normal rate, regular rhythm and normal heart sounds.   No murmur heard. Pulmonary/Chest: Effort normal and breath sounds normal. No respiratory distress. She has  no wheezes. She has no rales.       Dialysis catheter right chest wall  Abdominal: Soft. Bowel sounds are normal. She exhibits no distension. There is tenderness. There is guarding. There is no rebound.  Musculoskeletal: Normal range of motion.  Neurological: She is alert and oriented to person, place, and time. No cranial nerve deficit.  Skin: Skin is warm and dry.  Psychiatric: She has a normal mood and affect. Her speech is normal.    ED Course  Procedures (including critical care time)  Labs Reviewed - No data to display No results found.   1. Hypertensive CKD, ESRD on dialysis       MDM  Patient comes in for her dialysis. She is not currently able to get dialyzed in the dialysis centers. Patient be admitted to medicine         Juliet Rude. Rubin Payor, MD 07/10/11 1654

## 2011-07-10 NOTE — ED Notes (Signed)
Pt here for dialysis 

## 2011-07-10 NOTE — ED Provider Notes (Signed)
History     CSN: [id number]  Arrival date & time 07/08/11  1145   First MD Initiated Contact with Patient 07/08/11 1204      Chief Complaint  Patient presents with  . Vascular Access Problem    (Consider location/radiation/quality/duration/timing/severity/associated sxs/prior treatment) HPI Comments: The patient is a pleasant woman who has ESRD and undgoes dialysis three times weekly on M, W, F most recently through the ED/Inpatient dialysis unit, under the care of [name], nephrology.  She presents for routine dialysis.  She denies dyspnea, swelling, chest pain, palpitations, fever, or any other symptoms, is well appearing and eating a renal diet lunch tray in her room when I meet her.  I performed a screening examination prior to contacting the [facility] as per a recently discussed and disclosed treatment plan created by [name]-Offutt with Dr Rito Ehrlich and hospital administration.  The patient is in a pleasant mood and is receptive and cooperative to my interview and physical examination.  The history is provided by the patient and medical records.    Past Medical History  Diagnosis Date  . Chronic kidney disease     esrd  . Diabetes mellitus   . Hyperlipidemia   . Hypertension   . Renal disorder   . Dialysis care     tues, thurs, sat  . 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 Carotid endarterectomy 2002    left  . Btl   . Rectal fissurectomy   . Av fistula placement 10/30/2010    right brachiocephalic   . Fistulogram     Family History  Problem Relation Age of Onset  . COPD Mother   . Kidney disease Mother   . Heart disease Father   . Lymphoma Son     History  Substance Use Topics  . Smoking status: Current Everyday Smoker -- 1.0 packs/day for 50 years    Types: Cigarettes  . Smokeless tobacco: Never Used  . Alcohol Use: No    OB History    Grav Para Term Preterm Abortions TAB SAB Ect Mult Living                 Review of Systems  Constitutional: Negative for fever, chills and diaphoresis.  HENT: Negative.   Eyes: Negative.   Respiratory: Negative for cough and shortness of breath.   Cardiovascular: Negative for chest pain and palpitations.  Gastrointestinal: Negative.   Skin: Negative.   Neurological: Positive for dizziness. Negative for facial asymmetry, speech difficulty and headaches.  Psychiatric/Behavioral: Negative.     Allergies  Codeine; Epinephrine; and Percocet  Home Medications   Current Outpatient Rx  Name Route Sig Dispense Refill  . ACETAMINOPHEN 500 MG PO TABS Oral Take 500 mg by mouth every 6 (six) hours as needed. For pain    . ALLOPURINOL 100 MG PO TABS Oral Take 100 mg by mouth daily.      Marland Kitchen AMLODIPINE BESYLATE 5 MG PO TABS Oral Take 5 mg by mouth at bedtime.     Marland Kitchen CALCIUM ACETATE 667 MG PO CAPS Oral Take 1,334 mg by mouth 3 (three) times daily with meals.     Marland Kitchen COENZYME Q10 150 MG PO CAPS Oral Take 300 mg by mouth daily.     . COLCHICINE 0.6 MG PO TABS Oral Take 0.6 mg by mouth daily as needed. For gout.    . CYCLOBENZAPRINE HCL 10 MG PO TABS Oral Take  10 mg by mouth 2 (two) times daily as needed. For muscle spasms    . DOCUSATE SODIUM 100 MG PO CAPS Oral Take 200 mg by mouth at bedtime.      Marland Kitchen HYDRALAZINE HCL 25 MG PO TABS Oral Take 50 mg by mouth 3 (three) times daily.     . L-METHYLFOLATE-B6-B12 3-35-2 MG PO TABS Oral Take 1 tablet by mouth 2 (two) times daily.     Marland Kitchen LOSARTAN POTASSIUM 50 MG PO TABS Oral Take 50 mg by mouth at bedtime.     Marland Kitchen METOPROLOL SUCCINATE ER 50 MG PO TB24 Oral Take 50 mg by mouth at bedtime.     Marland Kitchen RENA-VITE PO TABS Oral Take 1 tablet by mouth daily.      Marland Kitchen OVER THE COUNTER MEDICATION Oral Take 60-90 mLs by mouth daily. OTC. QiNopal    . REPAGLINIDE 0.5 MG PO TABS Oral Take 0.5 mg by mouth 3 (three) times daily before meals.        BP 183/75  Pulse 90  Temp(Src) 97.2 F (36.2 C) (Oral)  Resp 22  Ht 5' 8.11" (1.73 m)   Wt 125 lb (56.7 kg)  BMI 18.94 kg/m2  SpO2 94%  Physical Exam  Constitutional: She is oriented to person, place, and time. She appears well-developed and well-nourished. No distress.  HENT:  Head: Normocephalic and atraumatic.  Mouth/Throat: Oropharynx is clear and moist.  Eyes: EOM are normal. Pupils are equal, round, and reactive to light. Right eye exhibits no discharge. Left eye exhibits no discharge.  Neck: Normal range of motion. No JVD present. No tracheal deviation present.  Cardiovascular: Normal rate, regular rhythm, normal heart sounds and intact distal pulses.   Pulmonary/Chest: Effort normal and breath sounds normal. No respiratory distress. She has no wheezes. She has no rales.  Abdominal: Soft. Bowel sounds are normal. She exhibits no distension.  Musculoskeletal: Normal range of motion. She exhibits no edema.  Neurological: She is alert and oriented to person, place, and time. No cranial nerve deficit. Coordination normal. GCS eye subscore is 4. GCS verbal subscore is 5. GCS motor subscore is 6.  Skin: Skin is warm and dry. No rash noted. She is not diaphoretic. No erythema. No pallor.  Psychiatric: She has a normal mood and affect. Her behavior is normal. Judgment and thought content normal.    ED Course  Procedures (including critical care time)  Labs Reviewed  GLUCOSE, CAPILLARY - Abnormal; Notable for the following:    Glucose-Capillary 177 (*)    All other components within normal limits  CBC - Abnormal; Notable for the following:    Hemoglobin 11.1 (*)    HCT 35.1 (*)    RDW 20.8 (*)    All other components within normal limits  CREATININE, SERUM - Abnormal; Notable for the following:    Creatinine, Ser 7.85 (*)    GFR calc non Af Amer 5 (*)    GFR calc Af Amer 5 (*)    All other components within normal limits  LAB REPORT - SCANNED   No results found.   1. Renal failure   2. Hemodialysis patient       MDM  The patient appears medically cleared  and stable for dialysis today.  [facility] and [name] notified and will admit the patient for dialysis.        Felisa Bonier, MD 07/10/11 [phone]

## 2011-07-10 NOTE — Consult Note (Signed)
Pt. In ER dialysis orders written . 

## 2011-07-10 NOTE — H&P (Signed)
PCP:   Alexandra Floro, MD, MD   Chief Complaint:  For dialysis.  HPI: [name] is here for dialysis. She remains as sweet as ever. She denies shortness of breath. Last dialysis was on 4/25. Dr Alexandra [name] has already written her HD orders.  Review of Systems:  The patient denies anorexia, fever, weight loss,, vision loss, decreased hearing, hoarseness, chest pain, syncope, dyspnea on exertion, peripheral edema, balance deficits, hemoptysis, abdominal pain, melena, hematochezia, severe indigestion/heartburn, hematuria, incontinence, genital sores, muscle weakness, suspicious skin lesions, transient blindness, difficulty walking, depression, unusual weight change, abnormal bleeding, enlarged lymph nodes, angioedema, and breast masses.  Past Medical History: Past Medical History  Diagnosis Date  . Chronic kidney disease     esrd  . Diabetes mellitus   . Hyperlipidemia   . Hypertension   . Renal disorder   . Dialysis care     tues, thurs, sat  . Gout due to renal impairment    Past Surgical History  Procedure Date  . Carotid endarterectomy 2002    left  . Btl   . Rectal fissurectomy   . Av fistula placement 10/30/2010    right brachiocephalic   . Fistulogram     Medications: Prior to Admission medications   Medication Sig Start Date End Date Taking? Authorizing Provider  acetaminophen (TYLENOL) 500 MG tablet Take 500 mg by mouth every 6 (six) hours as needed. For pain   Yes Historical Provider, MD  allopurinol (ZYLOPRIM) 100 MG tablet Take 100 mg by mouth daily.     Yes Historical Provider, MD  amLODipine (NORVASC) 5 MG tablet Take 5 mg by mouth at bedtime.    Yes Historical Provider, MD  calcium acetate (PHOSLO) 667 MG capsule Take 1,334 mg by mouth 3 (three) times daily with meals.    Yes Historical Provider, MD  Coenzyme Q10 150 MG CAPS Take 300 mg by mouth daily.    Yes Historical Provider, MD  colchicine 0.6 MG tablet Take 0.6 mg by mouth daily as needed. For gout.   Yes  Historical Provider, MD  cyclobenzaprine (FLEXERIL) 10 MG tablet Take 10 mg by mouth 2 (two) times daily as needed. For muscle spasms   Yes Historical Provider, MD  docusate sodium (COLACE) 100 MG capsule Take 200 mg by mouth at bedtime.     Yes Historical Provider, MD  hydrALAZINE (APRESOLINE) 25 MG tablet Take 50 mg by mouth 3 (three) times daily.    Yes Historical Provider, MD  l-methylfolate-B6-B12 (METANX) 3-35-2 MG TABS Take 1 tablet by mouth 2 (two) times daily.    Yes Historical Provider, MD  losartan (COZAAR) 50 MG tablet Take 50 mg by mouth at bedtime.    Yes Historical Provider, MD  metoprolol (TOPROL-XL) 50 MG 24 hr tablet Take 50 mg by mouth at bedtime.    Yes Historical Provider, MD  multivitamin (RENA-VIT) TABS tablet Take 1 tablet by mouth daily.     Yes Historical Provider, MD  OVER THE COUNTER MEDICATION Take 60-90 mLs by mouth daily. OTC. QiNopal   Yes Historical Provider, MD  repaglinide (PRANDIN) 0.5 MG tablet Take 0.5 mg by mouth 3 (three) times daily before meals.     Yes Historical Provider, MD    Allergies:   Allergies  Allergen Reactions  . Codeine Other (See Comments)    Passes out  . Epinephrine Other (See Comments)    Tachycardia, diaphoresis, syncope  . Percocet (Oxycodone-Acetaminophen) Other (See Comments)    Passes  out  Social History:  reports that she has been smoking Cigarettes.  She has a 50 pack-year smoking history. She has never used smokeless tobacco. She reports that she does not drink alcohol or use illicit drugs.  Family History: Family History  Problem Relation Age of Onset  . COPD Mother   . Kidney disease Mother   . Heart disease Father   . Lymphoma Son     Physical Exam: Filed Vitals:   07/10/11 1220 07/10/11 1435 07/10/11 1440  BP: 191/66 200/88 194/76  Pulse: 98 88 86  Temp: 97.4 F (36.3 C)  97 F (36.1 C)  TempSrc: Oral  Oral  Resp: 18 16 18   SpO2: 94% 98% 95%   Sitting up in chair, not in distress. No JVD. Lungs  clear. S1S2 heard. No murmurs. Abdomen- soft, non tender. +BS. CNS- grossly Intact. Extremities- no pedal edema.   Labs on Admission:   [facility] 07/08/11 1546  NA --  K --  CL --  CO2 --  GLUCOSE --  BUN --  CREATININE 7.85*  CALCIUM --  MG --  PHOS --   No results found for this basename: AST:2,ALT:2,ALKPHOS:2,BILITOT:2,PROT:2,ALBUMIN:2 in the last 72 hours No results found for this basename: LIPASE:2,AMYLASE:2 in the last 72 hours  Basename 07/08/11 1546  WBC 6.6  NEUTROABS --  HGB 11.1*  HCT 35.1*  MCV 86.0  PLT 187   No results found for this basename: CKTOTAL:3,CKMB:3,CKMBINDEX:3,TROPONINI:3 in the last 72 hours No results found for this basename: TSH,T4TOTAL,FREET3,T3FREE,THYROIDAB in the last 72 hours No results found for this basename: VITAMINB12:2,FOLATE:2,FERRITIN:2,TIBC:2,IRON:2,RETICCTPCT:2 in the last 72 hours  Radiological Exams on Admission: No results found.  Assessment Pleasant 66 year old female female on HD, here for dialysis. Plan- admit obs for dialysis as planned by Dr Alexandra [name]. Continue her home medications for other chronic conditions, which seem stable at this point.   Hulet Ehrmann [phone]. 07/10/2011, 2:51 PM

## 2011-07-12 ENCOUNTER — Observation Stay (HOSPITAL_COMMUNITY): Payer: Medicare HMO

## 2011-07-12 ENCOUNTER — Observation Stay (HOSPITAL_COMMUNITY)
Admission: EM | Admit: 2011-07-12 | Discharge: 2011-07-13 | Discharge status: Against Medical Advice | Payer: Medicare HMO | Attending: Internal Medicine | Admitting: Internal Medicine

## 2011-07-12 ENCOUNTER — Encounter (HOSPITAL_COMMUNITY): Payer: Self-pay | Admitting: Internal Medicine

## 2011-07-12 DIAGNOSIS — N186 End stage renal disease: Secondary | ICD-10-CM | POA: Insufficient documentation

## 2011-07-12 DIAGNOSIS — E119 Type 2 diabetes mellitus without complications: Secondary | ICD-10-CM | POA: Insufficient documentation

## 2011-07-12 DIAGNOSIS — I120 Hypertensive chronic kidney disease with stage 5 chronic kidney disease or end stage renal disease: Secondary | ICD-10-CM | POA: Insufficient documentation

## 2011-07-12 DIAGNOSIS — E785 Hyperlipidemia, unspecified: Secondary | ICD-10-CM | POA: Insufficient documentation

## 2011-07-12 DIAGNOSIS — Z992 Dependence on renal dialysis: Secondary | ICD-10-CM | POA: Insufficient documentation

## 2011-07-12 DIAGNOSIS — I10 Essential (primary) hypertension: Secondary | ICD-10-CM | POA: Diagnosis present

## 2011-07-12 LAB — GLUCOSE, CAPILLARY: Glucose-Capillary: 209 mg/dL — ABNORMAL HIGH (ref 70–99)

## 2011-07-12 MED ORDER — AMLODIPINE BESYLATE 5 MG PO TABS
5.0000 mg | ORAL_TABLET | Freq: Once | ORAL | Status: DC
  Filled 2011-07-12: qty 1

## 2011-07-12 MED ORDER — LOSARTAN POTASSIUM 50 MG PO TABS
50.0000 mg | ORAL_TABLET | Freq: Once | ORAL | Status: DC
  Filled 2011-07-12: qty 1

## 2011-07-12 MED ORDER — METOPROLOL TARTRATE 50 MG PO TABS: 50.0000 mg | ORAL_TABLET | Freq: Once | ORAL | Status: DC

## 2011-07-12 MED ORDER — HEPARIN SODIUM (PORCINE) 1000 UNIT/ML DIALYSIS
1100.0000 [IU] | INTRAMUSCULAR | Status: DC | PRN
  Filled 2011-07-12: qty 2

## 2011-07-12 MED ORDER — DOCUSATE SODIUM 100 MG PO CAPS
200.0000 mg | ORAL_CAPSULE | Freq: Once | ORAL | Status: AC
  Administered 2011-07-13: 200 mg via ORAL
  Filled 2011-07-12: qty 2

## 2011-07-12 NOTE — ED Notes (Addendum)
Pt reports has taken her own nightly meds as follows: Hydralazine 50 mg, Prandin 0.5 mg; this nurse NOT notified by pt prior to self-administering

## 2011-07-12 NOTE — H&P (Signed)
Alexandra [name] is an 66 y.o. female.   PCP - Dr.Charles Tenny Craw. Nephrologist - Dr.Frazier. Chief Complaint: For hemodialysis. HPI: 66 year-old female with known history of ESRD on hemodialysis, hypertension, diabetes mellitus type 2, gout and anemia from chronic kidney disease has come for her regular dialysis. Patient denies any shortness of breath chest pain nausea vomiting abdominal pain headache or dizziness. Dr. Bascom Levels of nephrology has already arranged for dialysis and patient will be admitted for the same.  Past Medical History  Diagnosis Date  . Chronic kidney disease     esrd  . Diabetes mellitus   . Hyperlipidemia   . Hypertension   . Renal disorder   . Dialysis care     tues, thurs, sat  . 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 Carotid endarterectomy 2002    left  . Btl   . Rectal fissurectomy   . Av fistula placement 10/30/2010    right brachiocephalic   . Fistulogram     Family History  Problem Relation Age of Onset  . COPD Mother   . Kidney disease Mother   . Heart disease Father   . Lymphoma Son    Social History:  reports that she has been smoking Cigarettes.  She has a 50 pack-year smoking history. She has never used smokeless tobacco. She reports that she does not drink alcohol or use illicit drugs.  Allergies:  Allergies  Allergen Reactions  . Codeine Other (See Comments)    Passes out  . Epinephrine Other (See Comments)    Tachycardia, diaphoresis, syncope  . Percocet (Oxycodone-Acetaminophen) Other (See Comments)    Passes  out     (Not in a hospital admission)  No results found for this or any previous visit (from the past 48 hour(s)). Dg Chest 2 View  07/12/2011  *RADIOLOGY REPORT*  Clinical Data: Dialysis patient, history chronic kidney disease, diabetes, hypertension, hyperlipidemia, gout  CHEST - 2 VIEW  Comparison: 01/16/2010  Findings: Right jugular central venous catheter, tip projecting over  SVC. Upper-normal size of cardiac silhouette. Mediastinal contours and pulmonary vascularity normal. Lungs appear emphysematous but clear. No pleural effusion or pneumothorax. Bones appear diffusely demineralized. Minimal anterior height loss of a vertebra at the thoracolumbar junction, unchanged.  IMPRESSION: Emphysematous changes. No acute abnormalities.  Original Report Authenticated By: Lollie Marrow, M.D.    Review of Systems  Constitutional: Negative.   HENT: Negative.   Eyes: Negative.   Respiratory: Negative.   Cardiovascular: Negative.   Gastrointestinal: Negative.   Genitourinary: Negative.   Musculoskeletal: Negative.   Skin: Negative.   Neurological: Negative.   Endo/Heme/Allergies: Negative.   Psychiatric/Behavioral: Negative.     Blood pressure 197/60, temperature 98.5 F (36.9 C), temperature source Oral, resp. rate 18, SpO2 96.00%. Physical Exam  Constitutional: She is oriented to person, place, and time. She appears well-developed and well-nourished. No distress.  HENT:  Head: Normocephalic and atraumatic.  Right Ear: External ear normal.  Left Ear: External ear normal.  Eyes: Conjunctivae are normal. Pupils are equal, round, and reactive to light. Right eye exhibits no discharge. Left eye exhibits no discharge. No scleral icterus.  Neck: Normal range of motion. Neck supple.  Cardiovascular: Normal rate and regular rhythm.   Respiratory: Effort normal and breath sounds normal. No respiratory distress. She has no wheezes. She has no rales.  GI: Soft. Bowel sounds are normal. She exhibits no distension. There is no tenderness. There  is no rebound.  Musculoskeletal: Normal range of motion. She exhibits no edema and no tenderness.  Neurological: She is alert and oriented to person, place, and time.       Moves all extremities.  Skin: Skin is warm and dry. She is not diaphoretic.  Psychiatric: Her behavior is normal.     Assessment/Plan #1. ESRD on hemodialysis -  dialysis per nephrologist. #2. Uncontrolled hypertension - patient states she did not miss her medications. At this time we will continue home medications and keep patient on when necessary IV hydralazine for systolic blood pressure more than 160. Blood pressure may improve after dialysis. #3. History of diabetes mellitus2, gout - continue present medications.  CODE STATUS - full code.   Eduard Clos. 07/12/2011, 9:03 PM

## 2011-07-12 NOTE — ED Notes (Signed)
PT here for her dialysis today. PT had dialysis last on Saturday. A&Ox3 no signs of distress.

## 2011-07-12 NOTE — Consult Note (Signed)
  Pt. In ER for her dialysis.

## 2011-07-12 NOTE — ED Notes (Signed)
Dialysis support paperwork discussed with and signed by pt.  CSW will give completed forms to Genelle Bal, CSW as directed.

## 2011-07-12 NOTE — ED Provider Notes (Signed)
History     CSN: [id number]  Arrival date & time 07/12/11  7829   First MD Initiated Contact with Patient 07/12/11 1950      Chief Complaint  Patient presents with  . Vascular Access Problem    (Consider location/radiation/quality/duration/timing/severity/associated sxs/prior treatment) HPI Patient presents for hemodialysis. She is asymptomatic. Current plan is she comes to the emergency department 3 times per week for routine dialysis as she has no dialysis center. Reports she states her entire dialysis session at her last visit. Seen by Dr. Bascom Levels in the emergency department who has written orders for dialysis and requests admission to hospitalist service. No laboratory work needed, per Dr. Coralee Pesa Past Medical History  Diagnosis Date  . Chronic kidney disease     esrd  . Diabetes mellitus   . Hyperlipidemia   . Hypertension   . Renal disorder   . Dialysis care     tues, thurs, sat  . 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 Carotid endarterectomy 2002    left  . Btl   . Rectal fissurectomy   . Av fistula placement 10/30/2010    right brachiocephalic   . Fistulogram     Family History  Problem Relation Age of Onset  . COPD Mother   . Kidney disease Mother   . Heart disease Father   . Lymphoma Son     History  Substance Use Topics  . Smoking status: Current Everyday Smoker -- 1.0 packs/day for 50 years    Types: Cigarettes  . Smokeless tobacco: Never Used  . Alcohol Use: No    OB History    Grav Para Term Preterm Abortions TAB SAB Ect Mult Living                  Review of Systems  Constitutional: Negative.   HENT: Negative.   Respiratory: Negative.   Cardiovascular: Negative.   Gastrointestinal: Negative.   Musculoskeletal: Negative.   Skin: Negative.   Neurological: Negative.   Hematological: Negative.   Psychiatric/Behavioral: Negative.   All other systems reviewed and are negative.    Allergies    Codeine; Epinephrine; and Percocet  Home Medications   Current Outpatient Rx  Name Route Sig Dispense Refill  . ACETAMINOPHEN 500 MG PO TABS Oral Take 500 mg by mouth every 6 (six) hours as needed. For pain    . ALLOPURINOL 100 MG PO TABS Oral Take 100 mg by mouth daily.      Marland Kitchen AMLODIPINE BESYLATE 5 MG PO TABS Oral Take 5 mg by mouth at bedtime.     Marland Kitchen CALCIUM ACETATE 667 MG PO CAPS Oral Take 1,334 mg by mouth 3 (three) times daily with meals.     Marland Kitchen COENZYME Q10 150 MG PO CAPS Oral Take 300 mg by mouth daily.     . COLCHICINE 0.6 MG PO TABS Oral Take 0.6 mg by mouth daily as needed. For gout.    . CYCLOBENZAPRINE HCL 10 MG PO TABS Oral Take 10 mg by mouth 2 (two) times daily as needed. For muscle spasms    . DOCUSATE SODIUM 100 MG PO CAPS Oral Take 200 mg by mouth at bedtime.      Marland Kitchen HYDRALAZINE HCL 25 MG PO TABS Oral Take 50 mg by mouth 3 (three) times daily.     . L-METHYLFOLATE-B6-B12 3-35-2 MG PO TABS Oral Take 1 tablet by mouth 2 (two) times daily.     Marland Kitchen  LOSARTAN POTASSIUM 50 MG PO TABS Oral Take 50 mg by mouth at bedtime.     Marland Kitchen METOPROLOL SUCCINATE ER 50 MG PO TB24 Oral Take 50 mg by mouth at bedtime.     Marland Kitchen RENA-VITE PO TABS Oral Take 1 tablet by mouth daily.      Marland Kitchen OVER THE COUNTER MEDICATION Oral Take 60-90 mLs by mouth daily. OTC. QiNopal    . REPAGLINIDE 0.5 MG PO TABS Oral Take 0.5 mg by mouth 3 (three) times daily before meals.        BP 197/60  Temp(Src) 98.5 F (36.9 C) (Oral)  Resp 18  SpO2 96%  Physical Exam  Nursing note and vitals reviewed. Constitutional: She is oriented to person, place, and time. She appears well-developed and well-nourished. No distress.  HENT:  Head: Normocephalic and atraumatic.  Eyes: Conjunctivae are normal. Pupils are equal, round, and reactive to light.  Neck: Neck supple. No tracheal deviation present. No thyromegaly present.  Cardiovascular: Normal rate and regular rhythm.   No murmur heard. Pulmonary/Chest: Effort normal and breath  sounds normal.       Right-sided subclavian dialysis catheter present no redness or tenderness at site  Abdominal: Soft. Bowel sounds are normal. She exhibits no distension. There is no tenderness.  Musculoskeletal: Normal range of motion. She exhibits no edema and no tenderness.  Neurological: She is alert and oriented to person, place, and time. Coordination normal.  Skin: Skin is warm and dry. No rash noted.  Psychiatric: She has a normal mood and affect.    Date: 07/12/2011  Rate: 85  Rhythm: normal sinus rhythm  QRS Axis: normal  Intervals: normal  ST/T Wave abnormalities: nonspecific T wave changes  Conduction Disutrbances:none  Narrative Interpretation:   Old EKG Reviewed: No significant change from 06/03/2010  ED Course  Procedures (including critical care time)  Labs Reviewed - No data to display No results found.   No diagnosis found.    MDM  Spoke with Dr. Toniann Fail Patient to stay for 23 hour observation to get dialysis Diagnoses end-stage renal disease         Doug Sou, MD 07/12/11 2014

## 2011-07-12 NOTE — ED Notes (Signed)
Spoke with staff in dialysis unit - states Alexandra [name] was only nurse on unit and she was currently on 2100 to do a run; states just started so was unsure of how long run would be, anywhere from 3 - 4 hours; hence, making [name]'s availability for this pt between 2 - 3 am; pt made aware of same; pt currently expressing her frustration and discontent with this

## 2011-07-12 NOTE — ED Notes (Signed)
In to speak with pt - pt requesting to have her blood sugar checked as well as inquiring about time she will be taken to dialysis; EDT in to obtain blood glucose at this time; informed pt that I would call dialysis unit to check on their availability

## 2011-07-13 ENCOUNTER — Observation Stay (HOSPITAL_COMMUNITY): Payer: Medicare HMO

## 2011-07-13 LAB — RENAL FUNCTION PANEL
Calcium: 9.6 mg/dL (ref 8.4–10.5)
GFR calc Af Amer: 4 mL/min — ABNORMAL LOW (ref >90)
Glucose, Bld: 86 mg/dL (ref 70–99)
Phosphorus: 3 mg/dL (ref 2.3–4.6)
Sodium: 138 mEq/L (ref 135–145)

## 2011-07-13 LAB — GLUCOSE, CAPILLARY: Glucose-Capillary: 146 mg/dL — ABNORMAL HIGH (ref 70–99)

## 2011-07-13 LAB — CBC
Hemoglobin: 11.2 g/dL — ABNORMAL LOW (ref 12.0–15.0)
MCH: 27.5 pg (ref 26.0–34.0)
MCHC: 31.7 g/dL (ref 30.0–36.0)
Platelets: 189 10*3/uL (ref 150–400)

## 2011-07-13 MED ORDER — REPAGLINIDE 0.5 MG PO TABS
0.5000 mg | ORAL_TABLET | Freq: Once | ORAL | Status: AC
  Administered 2011-07-13: 0.5 mg via ORAL
  Filled 2011-07-13 (×2): qty 1

## 2011-07-13 MED ORDER — AMLODIPINE BESYLATE 5 MG PO TABS: 10.0000 mg | ORAL_TABLET | Freq: Every day | ORAL | Status: DC

## 2011-07-13 MED ORDER — TUBERCULIN PPD 5 UNIT/0.1ML ID SOLN
5.0000 [IU] | Freq: Once | INTRADERMAL | Status: DC
  Filled 2011-07-13: qty 0.1

## 2011-07-13 NOTE — Progress Notes (Signed)
   CARE MANAGEMENT NOTE 07/13/2011  Patient:  Alexandra [name], Alexandra [name]   Account Number:  [id number]  Date Initiated:  07/13/2011  Documentation initiated by:  Keyauna Graefe  Subjective/Objective Assessment:   Attempting to place pt for outpatient dialysis with [facility]     Action/Plan:   Met with pt who has signed appropriate forms, EKG and CXR complete , pt declined PPD, due to hx of postive reading and ask that CXR be used . Will try to obtain past test.   Anticipated DC Date:  07/13/2011   Anticipated DC Plan:  HOME/SELF CARE         Choice offered to / List presented to:             Status of service:  Completed, signed off Medicare Important Message given?   (If response is "NO", the following Medicare IM given date fields will be blank) Date Medicare IM given:   Date Additional Medicare IM given:    Discharge Disposition:  HOME/SELF CARE  Per UR Regulation:    If discussed at Long Length of Stay Meetings, dates discussed:    Comments:  07/13/2011 Pt d/c to home , CSW, Alexandra [name] will continue to work on application for [employer]. This CM will also continue to work on the process. Alexandra Shock RN MPH Case Manager [phone]

## 2011-07-13 NOTE — Discharge Summary (Signed)
Physician Discharge Summary  Patient ID: Alexandra [name] MRN: [id number] DOB/AGE: 1945/09/27 66 y.o.  Admit date: 07/12/2011 Discharge date: 07/13/2011  Primary Care Physician:  Alexandra Floro, MD, MD   Discharge Diagnoses:    Patient Active Problem List  Diagnoses  . Hyperparathyroidism, secondary renal  . Hyperlipidemia  . Anemia of chronic disease  . ESRD (end stage renal disease) on dialysis  . Diabetes mellitus  . HTN (hypertension), benign  . Hemodialysis patient  . H/O: gout  . Renal failure  . Hypertensive CKD, ESRD on dialysis    Medication List  As of 07/13/2011  9:01 AM   TAKE these medications         acetaminophen 500 MG tablet   Commonly known as: TYLENOL   Take 500 mg by mouth every 6 (six) hours as needed. For pain      allopurinol 100 MG tablet   Commonly known as: ZYLOPRIM   Take 100 mg by mouth daily.      amLODipine 5 MG tablet   Commonly known as: NORVASC   Take 2 tablets (10 mg total) by mouth at bedtime.      calcium acetate 667 MG capsule   Commonly known as: PHOSLO   Take 1,334 mg by mouth 3 (three) times daily with meals.      Coenzyme Q10 150 MG Caps   Take 300 mg by mouth daily.      colchicine 0.6 MG tablet   Take 0.6 mg by mouth daily as needed. For gout.      cyclobenzaprine 10 MG tablet   Commonly known as: FLEXERIL   Take 10 mg by mouth 2 (two) times daily as needed. For muscle spasms      docusate sodium 100 MG capsule   Commonly known as: COLACE   Take 200 mg by mouth at bedtime.      hydrALAZINE 25 MG tablet   Commonly known as: APRESOLINE   Take 50 mg by mouth 3 (three) times daily.      l-methylfolate-B6-B12 3-35-2 MG Tabs   Commonly known as: METANX   Take 1 tablet by mouth 2 (two) times daily.      losartan 50 MG tablet   Commonly known as: COZAAR   Take 50 mg by mouth at bedtime.      metoprolol succinate 50 MG 24 hr tablet   Commonly known as: TOPROL-XL   Take 50 mg by mouth at bedtime.     multivitamin Tabs tablet   Take 1 tablet by mouth daily.      OVER THE COUNTER MEDICATION   Take 60-90 mLs by mouth daily. OTC. QiNopal      repaglinide 0.5 MG tablet   Commonly known as: PRANDIN   Take 0.5 mg by mouth 3 (three) times daily before meals.             Disposition and Follow-up:  Follow up with primary MD and Primary Nephrologist.  Consults:  nephrology  Dr Jeri Cos  Significant Diagnostic Studies:  No results found.  Brief H and P: For complete details, refer to admission H and P. 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 She was asymptomatic.  Physical Exam: On 07/13/11 General:   Alert, communicative, fully oriented, talking in complete sentences, not short of breath at rest.  HEENT:  Mild clinical pallor, no jaundice, no conjunctival injection or discharge. NECK:  Supple, JVP not seen, no carotid bruits, no palpable lymphadenopathy, no palpable goiter. CHEST:  Clinically clear to auscultation, no wheezes, no crackles. HEART:  Sounds 1 and 2 heard, normal, regular, no murmurs. ABDOMEN:  Full, soft, non-tender, no palpable organomegaly, no palpable masses, normal bowel sounds. GENITALIA:  Not examined. LOWER EXTREMITIES:  No pitting edema, palpable peripheral pulses. MUSCULOSKELETAL SYSTEM:  Generalized osteoarthritic changes, otherwise, normal. CENTRAL NERVOUS SYSTEM:  No focal neurologic deficit on gross examination.  Hospital Course:  Active Problems:  1. ESRD (end stage renal disease) on dialysis: Patient presented as described above. She was asymptomatic, Dr Bascom Levels was consulted, and dialysis was carried out on 07/13/11, AM, without incident.   2. Diabetes mellitus: This appears well controlled, based on random blood glucose. Will defer monitoring of HBA1C to PMD.  3. HTN  (hypertension), benign: Uncontrolled at presentation, although anticipated to improve with dialysis. Per patient, her BP is labile, but does tend to be elevated, more often than not. Will increase Norvasc to 10 mg daily, but continue ARB and beta-blocker, in current dose.  Comment: Stable for discharge of 07/13/11.   Time spent on Discharge: 35 mins.  Signed: [name],CHRISTOPHER 07/13/2011, 9:01 AM

## 2011-07-13 NOTE — ED Notes (Signed)
Pt. Alert and oriented, transported to dialysis via stretcher with tele tech, NAD noted

## 2011-07-13 NOTE — ED Notes (Signed)
hospitalist paged

## 2011-07-13 NOTE — ED Notes (Addendum)
Spoke with Dr. Toniann Fail in reference to pt demanding her antihypertensive meds despite pre hemodialysis orders to HOLD antihypertensive meds; further informed him that current BP was 153/71; Per Dr.Kakrakandy, Dr. Bascom Levels wants to HOLD antihypertensive meds pre-dialysis and this should be done; pt made aware of same

## 2011-07-13 NOTE — ED Notes (Signed)
Hot tea given per pt request

## 2011-07-13 NOTE — ED Notes (Signed)
Called in to room by pt; pt requesting that we check her blood glucose again - informed pt that I would send tech in to do so; pt further requesting that I administer her night time meds which consisted of colace and antihypertensives; informed pt that I had ordered meds from pharmacy; however, there were prehemodialysis orders which clearly stated to HOLD antihypertensive meds; pt stated, in loud tone, "I demand that those meds be given to me now!"; again informed pt that I could not go against orders as written; pt then began to speak in extremely loud tone that I was to give her those meds or she wanted to see a doctor; I then informed pt that I would page hospitalist

## 2011-07-13 NOTE — Progress Notes (Signed)
Utilization review complete 

## 2011-07-13 NOTE — ED Notes (Signed)
Blood glucose 146mg/dl

## 2011-07-14 ENCOUNTER — Encounter (HOSPITAL_COMMUNITY): Payer: Self-pay | Admitting: Emergency Medicine

## 2011-07-14 ENCOUNTER — Observation Stay (HOSPITAL_COMMUNITY)
Admission: EM | Admit: 2011-07-14 | Discharge: 2011-07-15 | Disposition: A | Discharge status: Home / Self Care | Payer: Medicare HMO | Attending: Emergency Medicine | Admitting: Emergency Medicine

## 2011-07-14 DIAGNOSIS — N186 End stage renal disease: Secondary | ICD-10-CM | POA: Insufficient documentation

## 2011-07-14 DIAGNOSIS — D638 Anemia in other chronic diseases classified elsewhere: Secondary | ICD-10-CM | POA: Diagnosis present

## 2011-07-14 DIAGNOSIS — IMO0001 Reserved for inherently not codable concepts without codable children: Secondary | ICD-10-CM

## 2011-07-14 DIAGNOSIS — E1165 Type 2 diabetes mellitus with hyperglycemia: Secondary | ICD-10-CM

## 2011-07-14 DIAGNOSIS — E785 Hyperlipidemia, unspecified: Secondary | ICD-10-CM | POA: Insufficient documentation

## 2011-07-14 DIAGNOSIS — I10 Essential (primary) hypertension: Secondary | ICD-10-CM | POA: Diagnosis present

## 2011-07-14 DIAGNOSIS — I120 Hypertensive chronic kidney disease with stage 5 chronic kidney disease or end stage renal disease: Secondary | ICD-10-CM | POA: Insufficient documentation

## 2011-07-14 DIAGNOSIS — Z992 Dependence on renal dialysis: Principal | ICD-10-CM | POA: Insufficient documentation

## 2011-07-14 DIAGNOSIS — E119 Type 2 diabetes mellitus without complications: Secondary | ICD-10-CM | POA: Insufficient documentation

## 2011-07-14 MED ORDER — HEPARIN SODIUM (PORCINE) 1000 UNIT/ML DIALYSIS: 20.0000 [IU]/kg | INTRAMUSCULAR | Status: DC | PRN

## 2011-07-14 NOTE — ED Notes (Signed)
Resting quietly on stretcher with eyes closed - easily arousable; no complaints at this time; lights dimmed for pt comfort

## 2011-07-14 NOTE — Discharge Summary (Signed)
Agree with above findings

## 2011-07-14 NOTE — ED Provider Notes (Addendum)
History     CSN: [id number]  Arrival date & time 07/14/11  1914   First MD Initiated Contact with Patient 07/14/11 2137      No chief complaint on file.   (Consider location/radiation/quality/duration/timing/severity/associated sxs/prior treatment) HPI Pt presents for her regular dialysis. Last dialyzed 2 days ago. No new complaints specifically CP, SOB, cough, lower ext swelling, fever. Has been seen by Dr Bascom Levels and dialysis orders have been placed  Past Medical History  Diagnosis Date  . Chronic kidney disease     esrd  . Diabetes mellitus   . Hyperlipidemia   . Hypertension   . Renal disorder   . Dialysis care     tues, thurs, sat  . 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 Carotid endarterectomy 2002    left  . Btl   . Rectal fissurectomy   . Av fistula placement 10/30/2010    right brachiocephalic   . Fistulogram     Family History  Problem Relation Age of Onset  . COPD Mother   . Kidney disease Mother   . Heart disease Father   . Lymphoma Son     History  Substance Use Topics  . Smoking status: Current Everyday Smoker -- 1.0 packs/day for 50 years    Types: Cigarettes  . Smokeless tobacco: Never Used  . Alcohol Use: No    OB History    Grav Para Term Preterm Abortions TAB SAB Ect Mult Living                  Review of Systems  Constitutional: Negative for fever and chills.  Respiratory: Negative for cough and shortness of breath.   Cardiovascular: Negative for chest pain and leg swelling.  Gastrointestinal: Negative for abdominal pain.    Allergies  Codeine; Epinephrine; and Percocet  Home Medications   Current Outpatient Rx  Name Route Sig Dispense Refill  . ACETAMINOPHEN 500 MG PO TABS Oral Take 500 mg by mouth every 6 (six) hours as needed. For pain    . ALLOPURINOL 100 MG PO TABS Oral Take 100 mg by mouth daily.      Marland Kitchen AMLODIPINE BESYLATE 5 MG PO TABS Oral Take 2 tablets (10 mg total) by mouth at  bedtime. 30 tablet 0  . CALCIUM ACETATE 667 MG PO CAPS Oral Take 1,334 mg by mouth 3 (three) times daily with meals.     . DOCUSATE SODIUM 100 MG PO CAPS Oral Take 200 mg by mouth at bedtime.      Marland Kitchen HYDRALAZINE HCL 25 MG PO TABS Oral Take 50 mg by mouth 3 (three) times daily.     . L-METHYLFOLATE-B6-B12 3-35-2 MG PO TABS Oral Take 1 tablet by mouth 2 (two) times daily.     Marland Kitchen LOSARTAN POTASSIUM 50 MG PO TABS Oral Take 50 mg by mouth at bedtime.     Marland Kitchen METOPROLOL SUCCINATE ER 50 MG PO TB24 Oral Take 50 mg by mouth at bedtime.     Marland Kitchen RENA-VITE PO TABS Oral Take 1 tablet by mouth daily.      Marland Kitchen OVER THE COUNTER MEDICATION Oral Take 60-90 mLs by mouth daily. OTC. QiNopal    . REPAGLINIDE 0.5 MG PO TABS Oral Take 0.5 mg by mouth 3 (three) times daily before meals.      Marland Kitchen COENZYME Q10 150 MG PO CAPS Oral Take 300 mg by mouth daily.     . COLCHICINE  0.6 MG PO TABS Oral Take 0.6 mg by mouth daily as needed. For gout.      BP 205/74  Pulse 86  Temp(Src) 98.3 F (36.8 C) (Oral)  Resp 18  SpO2 96%  Physical Exam  Nursing note and vitals reviewed. Constitutional: She is oriented to person, place, and time. She appears well-developed and well-nourished. No distress.  HENT:  Head: Normocephalic and atraumatic.  Mouth/Throat: Oropharynx is clear and moist.  Eyes: EOM are normal. Pupils are equal, round, and reactive to light.  Neck: Normal range of motion. Neck supple.  Cardiovascular: Normal rate and regular rhythm.   Pulmonary/Chest: Effort normal and breath sounds normal. No respiratory distress. She has no wheezes. She has no rales.  Abdominal: Soft. Bowel sounds are normal. She exhibits no mass. There is no tenderness. There is no rebound and no guarding.  Musculoskeletal: Normal range of motion. She exhibits no edema and no tenderness.  Neurological: She is alert and oriented to person, place, and time.  Skin: Skin is warm and dry. No rash noted. No erythema.  Psychiatric: She has a normal mood  and affect. Her behavior is normal.    ED Course  Procedures (including critical care time)  Labs Reviewed - No data to display No results found.   1. ESRD (end stage renal disease) on dialysis       MDM  Discussed with [facility] and will admit for dialysis        Loren Racer, MD 07/14/11 2203  Loren Racer, MD 07/14/11 2240

## 2011-07-14 NOTE — ED Notes (Signed)
Patient here for her regular dialysis; patient denies any symptoms at this time.  Dr. Marya Landry already paged; hemodialysis called.  Meal tray ordered for patient.

## 2011-07-14 NOTE — H&P (Signed)
Alexandra [name] is an 66 y.o. female.  PCP - Alexandra [name]. Nephrologist - Alexandra [name].  Chief Complaint: For dialysis. HPI: 66 year-old female with known history of ESRD on hemodialysis, hypertension, diabetes mellitus2 has come to the ER for her regular dialysis. She denies any chest pain, shortness of breath, nausea vomiting or any abdominal pain. Alexandra [name], nephrologist is aware of patient's admission and is arranging for dialysis.  Past Medical History  Diagnosis Date  . Chronic kidney disease     esrd  . Diabetes mellitus   . Hyperlipidemia   . Hypertension   . Renal disorder   . Dialysis care     tues, thurs, sat  . 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 Carotid endarterectomy 2002    left  . Btl   . Rectal fissurectomy   . Av fistula placement 10/30/2010    right brachiocephalic   . Fistulogram     Family History  Problem Relation Age of Onset  . COPD Alexandra [name]   . Kidney disease Alexandra [name]   . Heart disease Alexandra [name]   . Lymphoma Alexandra [name]    Social History:  reports that she has been smoking Cigarettes.  She has a 50 pack-year smoking history. She has never used smokeless tobacco. She reports that she does not drink alcohol or use illicit drugs.  Allergies:  Allergies  Allergen Reactions  . Codeine Other (See Comments)    Passes out  . Epinephrine Other (See Comments)    Tachycardia, diaphoresis, syncope  . Percocet (Oxycodone-Acetaminophen) Other (See Comments)    Passes  out     (Not in a hospital admission)  Results for orders placed during the hospital encounter of 07/12/11 (from the past 48 hour(s))  GLUCOSE, CAPILLARY     Status: Abnormal   Collection Time   07/12/11 11:02 PM      Component Value Range Comment   Glucose-Capillary 209 (*) 70 - 99 (mg/dL)    Comment 1 Notify RN      Comment 2 Documented in Chart     GLUCOSE, CAPILLARY     Status: Abnormal   Collection Time   07/13/11 12:54 AM      Component Value  Range Comment   Glucose-Capillary 146 (*) 70 - 99 (mg/dL)    Comment 1 Notify RN      Comment 2 Documented in Chart     CBC     Status: Abnormal   Collection Time   07/13/11  4:58 AM      Component Value Range Comment   WBC 6.8  4.0 - 10.5 (K/uL)    RBC 4.07  3.87 - 5.11 (MIL/uL)    Hemoglobin 11.2 (*) 12.0 - 15.0 (g/dL)    HCT 16.1 (*) 09.6 - 46.0 (%)    MCV 86.7  78.0 - 100.0 (fL)    MCH 27.5  26.0 - 34.0 (pg)    MCHC 31.7  30.0 - 36.0 (g/dL)    RDW 04.5 (*) 40.9 - 15.5 (%)    Platelets 189  150 - 400 (K/uL)   RENAL FUNCTION PANEL     Status: Abnormal   Collection Time   07/13/11  4:58 AM      Component Value Range Comment   Sodium 138  135 - 145 (mEq/L)    Potassium 4.1  3.5 - 5.1 (mEq/L)    Chloride 97  96 - 112 (mEq/L)    CO2 26  19 - 32 (mEq/L)    Glucose, Bld 86  70 - 99 (mg/dL)    BUN 60 (*) 6 - 23 (mg/dL)    Creatinine, Ser 4.09 (*) 0.50 - 1.10 (mg/dL)    Calcium 9.6  8.4 - 10.5 (mg/dL)    Phosphorus 3.0  2.3 - 4.6 (mg/dL)    Albumin 3.2 (*) 3.5 - 5.2 (g/dL)    GFR calc non Af Amer 4 (*) >90 (mL/min)    GFR calc Af Amer 4 (*) >90 (mL/min)    No results found.  Review of Systems  Constitutional: Negative.   HENT: Negative.   Eyes: Negative.   Respiratory: Negative.   Cardiovascular: Negative.   Gastrointestinal: Negative.   Genitourinary: Negative.   Musculoskeletal: Negative.   Skin: Negative.   Neurological: Negative.   Endo/Heme/Allergies: Negative.   Psychiatric/Behavioral: Negative.     Blood pressure 205/74, pulse 86, temperature 98.3 F (36.8 C), temperature source Oral, resp. rate 18, SpO2 96.00%. Physical Exam  Constitutional: She is oriented to person, place, and time. She appears well-developed and well-nourished. No distress.  HENT:  Head: Normocephalic and atraumatic.  Right Ear: External ear normal.  Left Ear: External ear normal.  Nose: Nose normal.  Mouth/Throat: Oropharynx is clear and moist. No oropharyngeal exudate.  Eyes:  Conjunctivae are normal. Pupils are equal, round, and reactive to light. Right eye exhibits no discharge. Left eye exhibits no discharge. No scleral icterus.  Neck: Normal range of motion. Neck supple.  Cardiovascular: Normal rate and regular rhythm.   Respiratory: Effort normal and breath sounds normal. No respiratory distress. She has no wheezes. She has no rales.  GI: Soft. Bowel sounds are normal. She exhibits no distension. There is no tenderness. There is no rebound.  Musculoskeletal: Normal range of motion. She exhibits no edema and no tenderness.  Neurological: She is alert and oriented to person, place, and time.       Moves all extremities.  Skin: Skin is warm and dry. No rash noted. She is not diaphoretic. No erythema.     Assessment/Plan #1. ESRD and hemodialysis - dialysis as per nephrologist. #2. Hypertension uncontrolled - if patient's blood pressure remains uncontrolled after dialysis may need to adjust some of her oral antihypertensives. #3. Diabetes mellitus2 - continue present medications with sliding scale coverage. #4. Anemia of chronic disease. #5. History of gout.  CODE STATUS - full code.  Alexandra [name] 07/14/2011, 10:46 PM

## 2011-07-14 NOTE — Consult Note (Signed)
  Pt. In ER  Orders being written.

## 2011-07-14 NOTE — ED Notes (Signed)
Patient brought to RM 51 - denies nurse needs at present-states "will going to dialysis"

## 2011-07-14 NOTE — ED Notes (Signed)
Dr. Marya Landry returned page; states that he is putting in admission orders now.

## 2011-07-15 ENCOUNTER — Observation Stay (HOSPITAL_COMMUNITY): Payer: Medicare HMO

## 2011-07-15 LAB — CBC
MCH: 27.3 pg (ref 26.0–34.0)
Platelets: 160 10*3/uL (ref 150–400)
RBC: 4.03 MIL/uL (ref 3.87–5.11)
WBC: 7.3 10*3/uL (ref 4.0–10.5)

## 2011-07-15 LAB — DIFFERENTIAL
Eosinophils Relative: 2 % (ref 0–5)
Monocytes Absolute: 1.3 10*3/uL — ABNORMAL HIGH (ref 0.1–1.0)
Neutrophils Relative %: 54 % (ref 43–77)

## 2011-07-15 LAB — RENAL FUNCTION PANEL
CO2: 28 mEq/L (ref 19–32)
Calcium: 9.4 mg/dL (ref 8.4–10.5)
GFR calc Af Amer: 6 mL/min — ABNORMAL LOW (ref >90)
GFR calc non Af Amer: 5 mL/min — ABNORMAL LOW (ref >90)
Phosphorus: 3.4 mg/dL (ref 2.3–4.6)
Sodium: 136 mEq/L (ref 135–145)

## 2011-07-15 LAB — GLUCOSE, CAPILLARY: Glucose-Capillary: 117 mg/dL — ABNORMAL HIGH (ref 70–99)

## 2011-07-15 MED ORDER — PARICALCITOL 5 MCG/ML IV SOLN
INTRAVENOUS | Status: AC
  Administered 2011-07-15: 1 ug
  Filled 2011-07-15: qty 1

## 2011-07-15 MED ORDER — DOCUSATE SODIUM 100 MG PO CAPS: 200.0000 mg | ORAL_CAPSULE | Freq: Every day | ORAL | Status: DC

## 2011-07-15 MED ORDER — DOCUSATE SODIUM 100 MG PO CAPS
200.0000 mg | ORAL_CAPSULE | ORAL | Status: AC
  Administered 2011-07-15: 200 mg via ORAL
  Filled 2011-07-15: qty 2

## 2011-07-15 NOTE — ED Notes (Signed)
Pt transported to HD via stretcher.  

## 2011-07-15 NOTE — Progress Notes (Signed)
Pt. Requested the hospitalist be notified of her b/p . Clydie Braun with [facility] returned call, RN reported b/p of 167/85 hrt rate 85, no new orders given. Pt. Informed of her decision.

## 2011-07-15 NOTE — Progress Notes (Signed)
Pt was finished with hemodialysis at 0645 and discharged from the hospital at that time. However, pt refused to leave unit until she received a breakfast tray and ate.  Pt was cooperative as long as this happened. Unit manager Roosevelt Locks notified of pts refusal to leave unit for two hours post dialysis tx.

## 2011-07-15 NOTE — Discharge Summary (Addendum)
DISCHARGE SUMMARY  Alexandra [name]  MR#: [id number]  DOB:[birth year]  Date of Admission: 07/14/2011 Date of Discharge: 07/15/2011  Attending Physician:Dmarco Baldus Laural Benes  Patient's VOZ:[id number] [name], MD, MD  Consults: Nephrologist Dr. Bascom Levels  Discharge Diagnoses: Present on Admission:  .ESRD (end stage renal disease) on dialysis .Diabetes mellitus .HTN (hypertension), benign .Anemia of chronic disease    Medication List  As of 07/15/2011 12:48 PM   ASK your doctor about these medications         acetaminophen 500 MG tablet   Commonly known as: TYLENOL   Take 500 mg by mouth every 6 (six) hours as needed. For pain      allopurinol 100 MG tablet   Commonly known as: ZYLOPRIM   Take 100 mg by mouth daily.      amLODipine 5 MG tablet   Commonly known as: NORVASC   Take 2 tablets (10 mg total) by mouth at bedtime.      calcium acetate 667 MG capsule   Commonly known as: PHOSLO   Take 1,334 mg by mouth 3 (three) times daily with meals.      Coenzyme Q10 150 MG Caps   Take 300 mg by mouth daily.      colchicine 0.6 MG tablet   Take 0.6 mg by mouth daily as needed. For gout.      docusate sodium 100 MG capsule   Commonly known as: COLACE   Take 200 mg by mouth at bedtime.      hydrALAZINE 25 MG tablet   Commonly known as: APRESOLINE   Take 50 mg by mouth 3 (three) times daily.      l-methylfolate-B6-B12 3-35-2 MG Tabs   Commonly known as: METANX   Take 1 tablet by mouth 2 (two) times daily.      losartan 50 MG tablet   Commonly known as: COZAAR   Take 50 mg by mouth at bedtime.      metoprolol succinate 50 MG 24 hr tablet   Commonly known as: TOPROL-XL   Take 50 mg by mouth at bedtime.      multivitamin Tabs tablet   Take 1 tablet by mouth daily.      OVER THE COUNTER MEDICATION   Take 60-90 mLs by mouth daily. OTC. QiNopal      repaglinide 0.5 MG tablet   Commonly known as: PRANDIN   Take 0.5 mg by mouth 3 (three) times daily before meals.                Hospital Course: Present on Admission:  .ESRD (end stage renal disease) on dialysis .Diabetes mellitus .HTN (hypertension), benign .Anemia of chronic disease:  For complete details, refer to admission H and P. 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 She was asymptomatic.  She received her HD treatment with no complications and was discharged home before being seen by primary team. I wasn't called to see pt or to give a discharge order.    Day of Discharge BP 194/72  Pulse 78  Temp(Src) 98.5 F (36.9 C) (Oral)  Resp 20  Wt 58.2 kg (128 lb 4.9 oz)  SpO2 92%   Results for orders placed during the hospital encounter of 07/14/11 (from the past 24 hour(s))  GLUCOSE, CAPILLARY     Status: Abnormal   Collection Time   07/15/11  12:08 AM      Component Value Range   Glucose-Capillary 117 (*) 70 - 99 (mg/dL)  CBC     Status: Abnormal   Collection Time   07/15/11  2:49 AM      Component Value Range   WBC 7.3  4.0 - 10.5 (K/uL)   RBC 4.03  3.87 - 5.11 (MIL/uL)   Hemoglobin 11.0 (*) 12.0 - 15.0 (g/dL)   HCT 13.0 (*) 86.5 - 46.0 (%)   MCV 84.9  78.0 - 100.0 (fL)   MCH 27.3  26.0 - 34.0 (pg)   MCHC 32.2  30.0 - 36.0 (g/dL)   RDW 78.4 (*) 69.6 - 15.5 (%)   Platelets 160  150 - 400 (K/uL)  DIFFERENTIAL     Status: Abnormal   Collection Time   07/15/11  2:49 AM      Component Value Range   Neutrophils Relative 54  43 - 77 (%)   Lymphocytes Relative 26  12 - 46 (%)   Monocytes Relative 18 (*) 3 - 12 (%)   Eosinophils Relative 2  0 - 5 (%)   Basophils Relative 0  0 - 1 (%)   Neutro Abs 4.0  1.7 - 7.7 (K/uL)   Lymphs Abs 1.9  0.7 - 4.0 (K/uL)   Monocytes Absolute 1.3 (*) 0.1 - 1.0 (K/uL)   Eosinophils Absolute 0.1  0.0 - 0.7 (K/uL)   Basophils Absolute 0.0  0.0 - 0.1 (K/uL)   RBC Morphology  POLYCHROMASIA PRESENT    RENAL FUNCTION PANEL     Status: Abnormal   Collection Time   07/15/11  2:49 AM      Component Value Range   Sodium 136  135 - 145 (mEq/L)   Potassium 4.3  3.5 - 5.1 (mEq/L)   Chloride 96  96 - 112 (mEq/L)   CO2 28  19 - 32 (mEq/L)   Glucose, Bld 152 (*) 70 - 99 (mg/dL)   BUN 54 (*) 6 - 23 (mg/dL)   Creatinine, Ser 2.95 (*) 0.50 - 1.10 (mg/dL)   Calcium 9.4  8.4 - 28.4 (mg/dL)   Phosphorus 3.4  2.3 - 4.6 (mg/dL)   Albumin 3.0 (*) 3.5 - 5.2 (g/dL)   GFR calc non Af Amer 5 (*) >90 (mL/min)   GFR calc Af Amer 6 (*) >90 (mL/min)    Disposition: discharged home in stable condition  Follow-up with nephrologist Dr. Bascom Levels and primary care provider as scheduled.  Return for regularly scheduled HD.   Signed: Janiah Devinney 07/15/2011, 12:48 PM

## 2011-07-15 NOTE — Progress Notes (Addendum)
Pt was discharged before I was able to see her today.  I had received no call that pt was ready to be discharged.    Maryln Manuel, MD [facility], Team 8

## 2011-07-15 NOTE — ED Notes (Signed)
Spoke with [name], RN in hemodialysis - states ready for pt

## 2011-07-16 ENCOUNTER — Observation Stay (HOSPITAL_COMMUNITY)
Admission: EM | Admit: 2011-07-16 | Discharge: 2011-07-18 | Disposition: A | Discharge status: Home / Self Care | Payer: Medicare HMO | Attending: Internal Medicine | Admitting: Internal Medicine

## 2011-07-16 ENCOUNTER — Encounter (HOSPITAL_COMMUNITY): Payer: Self-pay | Admitting: Emergency Medicine

## 2011-07-16 DIAGNOSIS — M109 Gout, unspecified: Secondary | ICD-10-CM | POA: Insufficient documentation

## 2011-07-16 DIAGNOSIS — E119 Type 2 diabetes mellitus without complications: Secondary | ICD-10-CM

## 2011-07-16 DIAGNOSIS — N2581 Secondary hyperparathyroidism of renal origin: Secondary | ICD-10-CM | POA: Insufficient documentation

## 2011-07-16 DIAGNOSIS — N186 End stage renal disease: Secondary | ICD-10-CM

## 2011-07-16 DIAGNOSIS — I10 Essential (primary) hypertension: Secondary | ICD-10-CM | POA: Diagnosis present

## 2011-07-16 DIAGNOSIS — D638 Anemia in other chronic diseases classified elsewhere: Secondary | ICD-10-CM | POA: Insufficient documentation

## 2011-07-16 DIAGNOSIS — Z992 Dependence on renal dialysis: Principal | ICD-10-CM | POA: Insufficient documentation

## 2011-07-16 DIAGNOSIS — I120 Hypertensive chronic kidney disease with stage 5 chronic kidney disease or end stage renal disease: Secondary | ICD-10-CM | POA: Insufficient documentation

## 2011-07-16 DIAGNOSIS — E785 Hyperlipidemia, unspecified: Secondary | ICD-10-CM | POA: Insufficient documentation

## 2011-07-16 DIAGNOSIS — N19 Unspecified kidney failure: Secondary | ICD-10-CM

## 2011-07-16 LAB — CBC
MCH: 26.9 pg (ref 26.0–34.0)
MCHC: 31.5 g/dL (ref 30.0–36.0)
Platelets: 196 10*3/uL (ref 150–400)
RBC: 4.17 MIL/uL (ref 3.87–5.11)
RDW: 20.5 % — ABNORMAL HIGH (ref 11.5–15.5)

## 2011-07-16 MED ORDER — ENOXAPARIN SODIUM 30 MG/0.3ML ~~LOC~~ SOLN
30.0000 mg | Freq: Every day | SUBCUTANEOUS | Status: DC
  Filled 2011-07-16 (×2): qty 0.3

## 2011-07-16 MED ORDER — ONDANSETRON HCL 4 MG PO TABS: 4.0000 mg | ORAL_TABLET | Freq: Four times a day (QID) | ORAL | Status: DC | PRN

## 2011-07-16 MED ORDER — SODIUM CHLORIDE 0.9 % IJ SOLN: 3.0000 mL | INTRAMUSCULAR | Status: DC | PRN

## 2011-07-16 MED ORDER — SODIUM CHLORIDE 0.9 % IV SOLN: 250.0000 mL | INTRAVENOUS | Status: DC | PRN

## 2011-07-16 MED ORDER — SODIUM CHLORIDE 0.9 % IJ SOLN: 3.0000 mL | Freq: Two times a day (BID) | INTRAMUSCULAR | Status: DC

## 2011-07-16 MED ORDER — HYDROMORPHONE HCL PF 1 MG/ML IJ SOLN: 0.5000 mg | INTRAMUSCULAR | Status: DC | PRN

## 2011-07-16 MED ORDER — ACETAMINOPHEN 325 MG PO TABS: 650.0000 mg | ORAL_TABLET | Freq: Four times a day (QID) | ORAL | Status: DC | PRN

## 2011-07-16 MED ORDER — ENOXAPARIN SODIUM 40 MG/0.4ML ~~LOC~~ SOLN
40.0000 mg | SUBCUTANEOUS | Status: AC
  Filled 2011-07-16: qty 0.4

## 2011-07-16 MED ORDER — ONDANSETRON HCL 4 MG/2ML IJ SOLN: 4.0000 mg | Freq: Four times a day (QID) | INTRAMUSCULAR | Status: DC | PRN

## 2011-07-16 MED ORDER — HEPARIN SODIUM (PORCINE) 1000 UNIT/ML DIALYSIS
20.0000 [IU]/kg | INTRAMUSCULAR | Status: DC | PRN
  Filled 2011-07-16: qty 2

## 2011-07-16 MED ORDER — ACETAMINOPHEN 650 MG RE SUPP: 650.0000 mg | Freq: Four times a day (QID) | RECTAL | Status: DC | PRN

## 2011-07-16 NOTE — ED Notes (Signed)
Attempted to give pt lovenox, pt refused medications and states "i don't need no lovenox". MD will be made aware and dialysis unit.

## 2011-07-16 NOTE — Consult Note (Signed)
Pt. In ER .she needs dialysis. See orders.

## 2011-07-16 NOTE — ED Notes (Signed)
PT. IS HERE FOR HER ROUTINE HEMODIALYSIS. NO OTHER COMPLAINTS.

## 2011-07-16 NOTE — H&P (Signed)
DATE OF ADMISSION:  07/16/2011  PCP:    Alexandra Floro, MD, MD   Chief Complaint: Here For Dialysis   HPI: Alexandra [name] is an 66 y.o. female is without complaints and presents for her routine Dialysis that is scheduled for Monday Wednesdays and Fridays with Dr. Bascom Levels.    Past Medical History  Diagnosis Date  . Chronic kidney disease     esrd  . Diabetes mellitus   . Hyperlipidemia   . Hypertension   . Renal disorder   . Dialysis care     tues, thurs, sat  . 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 Carotid endarterectomy 2002    left  . Btl   . Rectal fissurectomy   . Av fistula placement 10/30/2010    right brachiocephalic   . Fistulogram     Medications:  HOME MEDS: Prior to Admission medications   Medication Sig Start Date End Date Taking? Authorizing Provider  acetaminophen (TYLENOL) 500 MG tablet Take 500 mg by mouth every 6 (six) hours as needed. For pain   Yes Historical Provider, MD  allopurinol (ZYLOPRIM) 100 MG tablet Take 100 mg by mouth daily.     Yes Historical Provider, MD  amLODipine (NORVASC) 5 MG tablet Take 2 tablets (10 mg total) by mouth at bedtime. 07/13/11  Yes [name], MD  calcium acetate (PHOSLO) 667 MG capsule Take 1,334 mg by mouth 3 (three) times daily with meals.    Yes Historical Provider, MD  Coenzyme Q10 150 MG CAPS Take 300 mg by mouth daily.    Yes Historical Provider, MD  colchicine 0.6 MG tablet Take 0.6 mg by mouth daily as needed. For gout.   Yes Historical Provider, MD  docusate sodium (COLACE) 100 MG capsule Take 200 mg by mouth at bedtime.     Yes Historical Provider, MD  hydrALAZINE (APRESOLINE) 25 MG tablet Take 50 mg by mouth 3 (three) times daily.    Yes Historical Provider, MD  l-methylfolate-B6-B12 (METANX) 3-35-2 MG TABS Take 1 tablet by mouth 2 (two) times daily.    Yes Historical Provider, MD  losartan (COZAAR) 50 MG tablet Take 50 mg by mouth at bedtime.    Yes Historical  Provider, MD  metoprolol (TOPROL-XL) 50 MG 24 hr tablet Take 50 mg by mouth at bedtime.    Yes Historical Provider, MD  multivitamin (RENA-VIT) TABS tablet Take 1 tablet by mouth daily.     Yes Historical Provider, MD  OVER THE COUNTER MEDICATION Take 60-90 mLs by mouth daily. OTC. QiNopal   Yes Historical Provider, MD  repaglinide (PRANDIN) 0.5 MG tablet Take 0.5 mg by mouth 3 (three) times daily before meals.     Yes Historical Provider, MD    Allergies:  Allergies  Allergen Reactions  . Codeine Other (See Comments)    Passes out  . Epinephrine Other (See Comments)    Tachycardia, diaphoresis, syncope  . Percocet (Oxycodone-Acetaminophen) Other (See Comments)    Passes  out    Social History:   reports that she has been smoking Cigarettes.  She has a 50 pack-year smoking history. She has never used smokeless tobacco. She reports that she does not drink alcohol or use illicit drugs.  Family History: Family History  Problem Relation Age of Onset  . COPD Mother   . Kidney disease Mother   . Heart disease Father   . Lymphoma Son     Review  of Systems: + peripheral edema, The patient denies anorexia, fever, weight loss, vision loss, decreased hearing, hoarseness, chest pain, syncope, dyspnea on exertion, balance deficits, hemoptysis, abdominal pain, melena, hematochezia, severe indigestion/heartburn, hematuria, incontinence, genital sores, muscle weakness, suspicious skin lesions, transient blindness, difficulty walking, depression, unusual weight change, abnormal bleeding, enlarged lymph nodes, angioedema, and breast masses.   Physical Exam:  GEN:  Pleasant 66 year old thin African [place]irc[place] female examined  and in no acute distress; cooperative with exam Filed Vitals:   07/16/11 1927  BP: 207/72  Pulse: 91  Temp: 98.5 F (36.9 [name])  TempSrc: Oral  Resp: 20  SpO2: 96%   Blood pressure 207/72, pulse 91, temperature 98.5 F (36.9 [name]), temperature source Oral, resp. rate 20,  SpO2 96.00%. PSYCH: She is alert and oriented x4; does not appear anxious does not appear depressed; affect is normal HEENT: Normocephalic and Atraumatic, Mucous membranes pink; PERRLA; EOM intact; Fundi:  Benign;  No scleral icterus, Nares: Patent, Oropharynx: Clear, Poor Dentition, Neck:  FROM, no cervical lymphadenopathy nor thyromegaly or carotid bruit; no JVD; Breasts:: Not examined CHEST WALL: No tenderness CHEST: Normal respiration, clear to auscultation bilaterally HEART: Regular rate and rhythm; no murmurs rubs or gallops BACK: No kyphosis or scoliosis; no CVA tenderness ABDOMEN: Positive Bowel Sounds, Scaphoid, soft non-tender; no masses, no organomegaly, no pannus; no intertriginous candida. Rectal Exam: Not done EXTREMITIES: No bone or joint deformity; age-appropriate arthropathy of the hands and knees; no cyanosis, clubbing or +TRACE BLE EDEMA; no ulcerations. Genitalia: not examined PULSES: 2+ and symmetric SKIN: Normal hydration no rash or ulceration CNS: Cranial nerves 2-12 grossly intact no focal neurologic deficit   Labs & Imaging Results for orders placed during the hospital encounter of 07/16/11 (from the past 48 hour(s))  GLUCOSE, CAPILLARY     Status: Abnormal   Collection Time   07/16/11  9:13 PM      Component Value Range Comment   Glucose-Capillary 103 (*) 70 - 99 (mg/dL)    No results found.    Assessment: Present on Admission:  .ESRD (end stage renal disease) on dialysis .Hyperparathyroidism, secondary renal .Hyperlipidemia .Anemia of chronic disease .Diabetes mellitus .HTN (hypertension), benign   Plan:    Admit to Observation MED/SURG Bed Dialysis Orders Per Dr. Bascom Levels Continue Regular medications Discharge to home when Dialysis completed.   DVT prophylaxis.    Other plans as per orders.    CODE STATUS:      FULL CODE       Alexandra [name] 07/16/2011, 9:31 PM

## 2011-07-16 NOTE — ED Notes (Signed)
Admitting MD Patel at bedside 

## 2011-07-16 NOTE — ED Provider Notes (Signed)
History     CSN: [id number]  Arrival date & time 07/16/11  1918   First MD Initiated Contact with Patient 07/16/11 1934      No chief complaint on file.   (Consider location/radiation/quality/duration/timing/severity/associated sxs/prior treatment) The history is provided by the patient.   patient here for her scheduled dialysis. Denies any shortness of breath chest pain. Does note some lower extremity edema. No other complaints of. Nothing makes her symptoms better or worse and no treatment done prior to arrival  Past Medical History  Diagnosis Date  . Chronic kidney disease     esrd  . Diabetes mellitus   . Hyperlipidemia   . Hypertension   . Renal disorder   . Dialysis care     tues, thurs, sat  . 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 Carotid endarterectomy 2002    left  . Btl   . Rectal fissurectomy   . Av fistula placement 10/30/2010    right brachiocephalic   . Fistulogram     Family History  Problem Relation Age of Onset  . COPD Mother   . Kidney disease Mother   . Heart disease Father   . Lymphoma Son     History  Substance Use Topics  . Smoking status: Current Everyday Smoker -- 1.0 packs/day for 50 years    Types: Cigarettes  . Smokeless tobacco: Never Used  . Alcohol Use: No    OB History    Grav Para Term Preterm Abortions TAB SAB Ect Mult Living                  Review of Systems  All other systems reviewed and are negative.    Allergies  Codeine; Epinephrine; and Percocet  Home Medications   Current Outpatient Rx  Name Route Sig Dispense Refill  . ACETAMINOPHEN 500 MG PO TABS Oral Take 500 mg by mouth every 6 (six) hours as needed. For pain    . ALLOPURINOL 100 MG PO TABS Oral Take 100 mg by mouth daily.      Marland Kitchen AMLODIPINE BESYLATE 5 MG PO TABS Oral Take 2 tablets (10 mg total) by mouth at bedtime. 30 tablet 0  . CALCIUM ACETATE 667 MG PO CAPS Oral Take 1,334 mg by mouth 3 (three) times daily  with meals.     Marland Kitchen COENZYME Q10 150 MG PO CAPS Oral Take 300 mg by mouth daily.     . COLCHICINE 0.6 MG PO TABS Oral Take 0.6 mg by mouth daily as needed. For gout.    Marland Kitchen DOCUSATE SODIUM 100 MG PO CAPS Oral Take 200 mg by mouth at bedtime.      Marland Kitchen HYDRALAZINE HCL 25 MG PO TABS Oral Take 50 mg by mouth 3 (three) times daily.     . L-METHYLFOLATE-B6-B12 3-35-2 MG PO TABS Oral Take 1 tablet by mouth 2 (two) times daily.     Marland Kitchen LOSARTAN POTASSIUM 50 MG PO TABS Oral Take 50 mg by mouth at bedtime.     Marland Kitchen METOPROLOL SUCCINATE ER 50 MG PO TB24 Oral Take 50 mg by mouth at bedtime.     Marland Kitchen RENA-VITE PO TABS Oral Take 1 tablet by mouth daily.      Marland Kitchen OVER THE COUNTER MEDICATION Oral Take 60-90 mLs by mouth daily. OTC. QiNopal    . REPAGLINIDE 0.5 MG PO TABS Oral Take 0.5 mg by mouth 3 (three) times daily before meals.  BP 207/72  Pulse 91  Temp(Src) 98.5 F (36.9 C) (Oral)  Resp 20  SpO2 96%  Physical Exam  Nursing note and vitals reviewed. Constitutional: She is oriented to person, place, and time. She appears well-developed and well-nourished.  Non-toxic appearance. No distress.  HENT:  Head: Normocephalic and atraumatic.  Eyes: Conjunctivae, EOM and lids are normal. Pupils are equal, round, and reactive to light.  Neck: Normal range of motion. Neck supple. No tracheal deviation present. No mass present.  Cardiovascular: Normal rate, regular rhythm and normal heart sounds.  Exam reveals no gallop.   No murmur heard. Pulmonary/Chest: Effort normal and breath sounds normal. No stridor. No respiratory distress. She has no decreased breath sounds. She has no wheezes. She has no rhonchi. She has no rales.  Abdominal: Soft. Normal appearance and bowel sounds are normal. She exhibits no distension. There is no tenderness. There is no rebound and no CVA tenderness.  Musculoskeletal: Normal range of motion. She exhibits no edema and no tenderness.       Lower extremity edema bilateral  Neurological:  She is alert and oriented to person, place, and time. She has normal strength. No cranial nerve deficit or sensory deficit. GCS eye subscore is 4. GCS verbal subscore is 5. GCS motor subscore is 6.  Skin: Skin is warm and dry. No abrasion and no rash noted.  Psychiatric: She has a normal mood and affect. Her speech is normal and behavior is normal.    ED Course  Procedures (including critical care time)  Labs Reviewed - No data to display No results found.   No diagnosis found.    MDM  Spoke with Dr. Leretha Dykes and he requested that the hospitalist and the patient        Toy Baker, MD 07/16/11 1944

## 2011-07-16 NOTE — ED Notes (Signed)
cbg 103 let nures know

## 2011-07-16 NOTE — ED Notes (Signed)
Report given to [facility] in dialysis.

## 2011-07-17 LAB — GLUCOSE, CAPILLARY
Glucose-Capillary: 101 mg/dL — ABNORMAL HIGH (ref 70–99)
Glucose-Capillary: 160 mg/dL — ABNORMAL HIGH (ref 70–99)
Glucose-Capillary: 90 mg/dL (ref 70–99)

## 2011-07-17 LAB — RENAL FUNCTION PANEL
Albumin: 3.2 g/dL — ABNORMAL LOW (ref 3.5–5.2)
Calcium: 9.6 mg/dL (ref 8.4–10.5)
GFR calc Af Amer: 7 mL/min — ABNORMAL LOW (ref >90)
GFR calc non Af Amer: 6 mL/min — ABNORMAL LOW (ref >90)
Phosphorus: 2.7 mg/dL (ref 2.3–4.6)
Sodium: 140 mEq/L (ref 135–145)

## 2011-07-17 MED ORDER — INSULIN ASPART 100 UNIT/ML ~~LOC~~ SOLN: 0.0000 [IU] | Freq: Every day | SUBCUTANEOUS | Status: DC

## 2011-07-17 MED ORDER — METOPROLOL TARTRATE 100 MG PO TABS
100.0000 mg | ORAL_TABLET | Freq: Two times a day (BID) | ORAL | Status: DC
  Administered 2011-07-17 – 2011-07-18 (×2): 100 mg via ORAL
  Filled 2011-07-17 (×3): qty 1

## 2011-07-17 MED ORDER — ACETAMINOPHEN 500 MG PO TABS: 500.0000 mg | ORAL_TABLET | Freq: Four times a day (QID) | ORAL | Status: DC | PRN

## 2011-07-17 MED ORDER — CALCIUM ACETATE 667 MG PO CAPS
1334.0000 mg | ORAL_CAPSULE | Freq: Three times a day (TID) | ORAL | Status: DC
  Administered 2011-07-17 – 2011-07-18 (×4): 1334 mg via ORAL
  Filled 2011-07-17 (×6): qty 2

## 2011-07-17 MED ORDER — DARBEPOETIN ALFA-POLYSORBATE 100 MCG/0.5ML IJ SOLN
INTRAMUSCULAR | Status: AC
  Administered 2011-07-17: 100 ug
  Filled 2011-07-17: qty 0.5

## 2011-07-17 MED ORDER — LOSARTAN POTASSIUM 50 MG PO TABS
50.0000 mg | ORAL_TABLET | Freq: Every day | ORAL | Status: DC
  Administered 2011-07-17: 50 mg via ORAL
  Filled 2011-07-17 (×2): qty 1

## 2011-07-17 MED ORDER — METOPROLOL SUCCINATE ER 50 MG PO TB24
50.0000 mg | ORAL_TABLET | Freq: Once | ORAL | Status: AC
  Administered 2011-07-17: 50 mg via ORAL
  Filled 2011-07-17 (×3): qty 1

## 2011-07-17 MED ORDER — L-METHYLFOLATE-B6-B12 3-35-2 MG PO TABS
1.0000 | ORAL_TABLET | Freq: Two times a day (BID) | ORAL | Status: DC
  Administered 2011-07-17 – 2011-07-18 (×3): 1 via ORAL
  Filled 2011-07-17 (×4): qty 1

## 2011-07-17 MED ORDER — AMLODIPINE BESYLATE 10 MG PO TABS
10.0000 mg | ORAL_TABLET | Freq: Every day | ORAL | Status: DC
  Administered 2011-07-18: 10 mg via ORAL
  Filled 2011-07-17: qty 1

## 2011-07-17 MED ORDER — LOSARTAN POTASSIUM 50 MG PO TABS
50.0000 mg | ORAL_TABLET | Freq: Once | ORAL | Status: AC
  Administered 2011-07-17: 50 mg via ORAL
  Filled 2011-07-17: qty 1

## 2011-07-17 MED ORDER — HYDRALAZINE HCL 50 MG PO TABS
50.0000 mg | ORAL_TABLET | Freq: Four times a day (QID) | ORAL | Status: DC
  Administered 2011-07-17 – 2011-07-18 (×3): 50 mg via ORAL
  Filled 2011-07-17 (×7): qty 1

## 2011-07-17 MED ORDER — CAMPHOR-MENTHOL 0.5-0.5 % EX LOTN
TOPICAL_LOTION | CUTANEOUS | Status: DC | PRN
  Filled 2011-07-17: qty 222

## 2011-07-17 MED ORDER — HYDRALAZINE HCL 50 MG PO TABS
50.0000 mg | ORAL_TABLET | Freq: Three times a day (TID) | ORAL | Status: DC
  Filled 2011-07-17 (×2): qty 1

## 2011-07-17 MED ORDER — HYDRALAZINE HCL 50 MG PO TABS
50.0000 mg | ORAL_TABLET | Freq: Three times a day (TID) | ORAL | Status: DC
  Administered 2011-07-17 (×2): 50 mg via ORAL
  Filled 2011-07-17 (×3): qty 1

## 2011-07-17 MED ORDER — HYDRALAZINE HCL 25 MG PO TABS
25.0000 mg | ORAL_TABLET | Freq: Once | ORAL | Status: AC
  Administered 2011-07-17: 25 mg via ORAL
  Filled 2011-07-17: qty 1

## 2011-07-17 MED ORDER — METOPROLOL SUCCINATE ER 50 MG PO TB24
50.0000 mg | ORAL_TABLET | Freq: Every day | ORAL | Status: DC
  Filled 2011-07-17: qty 1

## 2011-07-17 MED ORDER — HYDRALAZINE HCL 50 MG PO TABS: 50.0000 mg | ORAL_TABLET | Freq: Three times a day (TID) | ORAL | Status: DC

## 2011-07-17 MED ORDER — RENA-VITE PO TABS
1.0000 | ORAL_TABLET | Freq: Every day | ORAL | Status: DC
  Administered 2011-07-17 – 2011-07-18 (×3): 1 via ORAL
  Filled 2011-07-17 (×2): qty 1

## 2011-07-17 MED ORDER — AMLODIPINE BESYLATE 10 MG PO TABS
10.0000 mg | ORAL_TABLET | Freq: Once | ORAL | Status: AC
Start: 2011-07-17 — End: 2011-07-17
  Administered 2011-07-17: 10 mg via ORAL
  Filled 2011-07-17: qty 1

## 2011-07-17 MED ORDER — DOCUSATE SODIUM 100 MG PO CAPS
100.0000 mg | ORAL_CAPSULE | Freq: Once | ORAL | Status: AC
  Administered 2011-07-17: 100 mg via ORAL
  Filled 2011-07-17 (×2): qty 1

## 2011-07-17 MED ORDER — REPAGLINIDE 0.5 MG PO TABS
0.5000 mg | ORAL_TABLET | Freq: Three times a day (TID) | ORAL | Status: DC
  Administered 2011-07-17 – 2011-07-18 (×4): 0.5 mg via ORAL
  Filled 2011-07-17 (×6): qty 1

## 2011-07-17 MED ORDER — COENZYME Q10 150 MG PO CAPS: 300.0000 mg | ORAL_CAPSULE | Freq: Every day | ORAL | Status: DC

## 2011-07-17 MED ORDER — INSULIN ASPART 100 UNIT/ML ~~LOC~~ SOLN: 0.0000 [IU] | Freq: Three times a day (TID) | SUBCUTANEOUS | Status: DC

## 2011-07-17 MED ORDER — COLCHICINE 0.6 MG PO TABS
0.6000 mg | ORAL_TABLET | Freq: Every day | ORAL | Status: DC | PRN
  Filled 2011-07-17: qty 1

## 2011-07-17 MED ORDER — ALLOPURINOL 100 MG PO TABS
100.0000 mg | ORAL_TABLET | Freq: Every day | ORAL | Status: DC
  Administered 2011-07-17 – 2011-07-18 (×2): 100 mg via ORAL
  Filled 2011-07-17 (×2): qty 1

## 2011-07-17 MED ORDER — HYDRALAZINE HCL 50 MG PO TABS
50.0000 mg | ORAL_TABLET | Freq: Four times a day (QID) | ORAL | Status: DC
  Filled 2011-07-17 (×3): qty 1

## 2011-07-17 MED ORDER — DOCUSATE SODIUM 100 MG PO CAPS
200.0000 mg | ORAL_CAPSULE | Freq: Every day | ORAL | Status: DC
  Administered 2011-07-17: 200 mg via ORAL
  Filled 2011-07-17: qty 1
  Filled 2011-07-17: qty 2

## 2011-07-17 MED ORDER — PARICALCITOL 5 MCG/ML IV SOLN
INTRAVENOUS | Status: AC
  Administered 2011-07-17: 1 ug
  Filled 2011-07-17: qty 1

## 2011-07-17 NOTE — Progress Notes (Signed)
Post hemodialysis blood pressure was 197/69; Dr. Royston Sinner

## 2011-07-17 NOTE — Progress Notes (Signed)
PHARMACIST - PHYSICIAN ORDER COMMUNICATION  CONCERNING: P&T Medication Policy on Herbal Medications  DESCRIPTION:  This patient's order for:  Coenzyme Q10  has been noted.  This product(s) is classified as an "herbal" or natural product. Due to a lack of definitive safety studies or FDA approval, nonstandard manufacturing practices, plus the potential risk of unknown drug-drug interactions while on inpatient medications, the Pharmacy and Therapeutics Committee does not permit the use of "herbal" or natural products of this type within [facility]   ACTION TAKEN: The pharmacy department is unable to verify this order at this time and your patient has been informed of this safety policy. Please reevaluate patient's clinical condition at discharge and address if the herbal or natural product(s) should be resumed at that time.  Celedonio Miyamoto, PharmD, BCPS Clinical Pharmacist Pager [phone]

## 2011-07-17 NOTE — Progress Notes (Signed)
Received report on patient from hemodialysis.  Patient arrived to the floor at 0510.  She is A&Ox3.  She is irritable and agitated at times and alternates this with being cooperative.  She is refusing the Lovenox Injection.  She refused the full skin assessment.  She is refusing the pneumonia vaccine.  She does not want to do the Admission History at this time.  Patient settled in room.  2 Warm blankets from HD given.  I also rubbed lotion to patient's entire back.  Called and obtained order for [employer].  Will continue to monitor BP.  She did not receive the 50 mg PO Metoprolol and this was given at 0702.  Stacie Glaze 07/17/2011

## 2011-07-17 NOTE — Progress Notes (Signed)
Dr. Bascom Levels notified at patient's request for Colace stool softener. Colace ordered and administered.

## 2011-07-17 NOTE — Progress Notes (Signed)
Subjective: Patient seen and examined, denies any complaints. RN reported that patient has been uncooperative  Objective: Vital signs in last 24 hours: Temp:  [97 F (36.1 C)-98.9 F (37.2 C)] 98.9 F (37.2 C) (05/04 0950) Pulse Rate:  [79-97] 79  (05/04 0950) Resp:  [16-20] 20  (05/04 0950) BP: (170-212)/(63-88) 210/72 mmHg (05/04 0950) SpO2:  [92 %-100 %] 92 % (05/04 0950) Weight:  [58.2 kg (128 lb 4.9 oz)-59.6 kg (131 lb 6.3 oz)] 58.2 kg (128 lb 4.9 oz) (05/03 2326) Weight change:  Last BM Date: 07/16/11  Intake/Output from previous day: 05/03 0701 - 05/04 0700 In: 60 [P.O.:60] Out: 1106  Total I/O In: 240 [P.O.:240] Out: -    Physical Exam: General: Alert, awake, oriented x3, in no acute distress. Heart: Regular rate and rhythm, without murmurs, rubs, gallops. Lungs: Clear to auscultation bilaterally. Abdomen: Soft, nontender, nondistended, positive bowel sounds. Extremities: No clubbing cyanosis or edema with positive pedal pulses. Neuro: Grossly intact, nonfocal.    Lab Results: Results for orders placed during the hospital encounter of 07/16/11 (from the past 24 hour(s))  GLUCOSE, CAPILLARY     Status: Abnormal   Collection Time   07/16/11  9:13 PM      Component Value Range   Glucose-Capillary 103 (*) 70 - 99 (mg/dL)  CBC     Status: Abnormal   Collection Time   07/16/11 10:57 PM      Component Value Range   WBC 7.3  4.0 - 10.5 (K/uL)   RBC 4.17  3.87 - 5.11 (MIL/uL)   Hemoglobin 11.2 (*) 12.0 - 15.0 (g/dL)   HCT 11.9 (*) 14.7 - 46.0 (%)   MCV 85.4  78.0 - 100.0 (fL)   MCH 26.9  26.0 - 34.0 (pg)   MCHC 31.5  30.0 - 36.0 (g/dL)   RDW 82.9 (*) 56.2 - 15.5 (%)   Platelets 196  150 - 400 (K/uL)  RENAL FUNCTION PANEL     Status: Abnormal   Collection Time   07/16/11 10:57 PM      Component Value Range   Sodium 140  135 - 145 (mEq/L)   Potassium 4.1  3.5 - 5.1 (mEq/L)   Chloride 99  96 - 112 (mEq/L)   CO2 30  19 - 32 (mEq/L)   Glucose, Bld 88  70 - 99  (mg/dL)   BUN 40 (*) 6 - 23 (mg/dL)   Creatinine, Ser 1.30 (*) 0.50 - 1.10 (mg/dL)   Calcium 9.6  8.4 - 86.5 (mg/dL)   Phosphorus 2.7  2.3 - 4.6 (mg/dL)   Albumin 3.2 (*) 3.5 - 5.2 (g/dL)   GFR calc non Af Amer 6 (*) >90 (mL/min)   GFR calc Af Amer 7 (*) >90 (mL/min)  GLUCOSE, CAPILLARY     Status: Normal   Collection Time   07/17/11  5:35 AM      Component Value Range   Glucose-Capillary 90  70 - 99 (mg/dL)  GLUCOSE, CAPILLARY     Status: Abnormal   Collection Time   07/17/11  7:58 AM      Component Value Range   Glucose-Capillary 66 (*) 70 - 99 (mg/dL)    Studies/Results: No results found.  Medications:    . allopurinol  100 mg Oral Daily  . amLODipine  10 mg Oral Once  . amLODipine  10 mg Oral Daily  . darbepoetin      . docusate sodium  100 mg Oral Once  . enoxaparin  30 mg  Subcutaneous QHS  . enoxaparin (LOVENOX) injection  40 mg Subcutaneous To Minor  . hydrALAZINE  25 mg Oral Once  . hydrALAZINE  50 mg Oral TID  . losartan  50 mg Oral Once  . losartan  50 mg Oral QHS  . metoprolol succinate  50 mg Oral Once  . metoprolol succinate  50 mg Oral QHS  . paricalcitol      . repaglinide  0.5 mg Oral TID AC  . sodium chloride  3 mL Intravenous Q12H  . DISCONTD: hydrALAZINE  50 mg Oral Q8H    sodium chloride, acetaminophen, acetaminophen, camphor-menthol, colchicine, heparin, HYDROmorphone (DILAUDID) injection, ondansetron (ZOFRAN) IV, ondansetron, sodium chloride     Assessment/Plan:  Principal Problem:  *ESRD (end stage renal disease) on dialysis Was dialyzed early this morning Active Problems: Uncontrolled hypertension: Resume home medications and adjust as needed  Diabetes mellitus: Continue Prandin, order for CBG monitoring and SSI  Hyperparathyroidism, secondary renal On Zemplar with dialysis, PhosLo  Anemia of chronic disease: Stable, continue Aranesp with dialysis     LOS: 1 day   Kaylah Chiasson 07/17/2011, 10:25 AM

## 2011-07-18 LAB — GLUCOSE, CAPILLARY: Glucose-Capillary: 77 mg/dL (ref 70–99)

## 2011-07-18 MED ORDER — METOPROLOL SUCCINATE ER 50 MG PO TB24: 100.0000 mg | ORAL_TABLET | Freq: Every day | ORAL | Status: DC

## 2011-07-18 MED ORDER — CLONIDINE HCL 0.1 MG PO TABS
0.1000 mg | ORAL_TABLET | Freq: Once | ORAL | Status: AC
  Administered 2011-07-18: 0.1 mg via ORAL
  Filled 2011-07-18: qty 1

## 2011-07-18 MED ORDER — CLONIDINE HCL 0.1 MG PO TABS: 0.1000 mg | ORAL_TABLET | Freq: Two times a day (BID) | ORAL | Status: DC

## 2011-07-18 MED ORDER — BIOTENE DRY MOUTH MT LIQD
15.0000 mL | Freq: Two times a day (BID) | OROMUCOSAL | Status: DC
  Administered 2011-07-18: 15 mL via OROMUCOSAL

## 2011-07-18 MED ORDER — CLONIDINE HCL ER 0.1 MG PO TB12: 0.1000 mg | ORAL_TABLET | Freq: Once | ORAL | Status: DC

## 2011-07-18 MED ORDER — HYDRALAZINE HCL 25 MG PO TABS: 50.0000 mg | ORAL_TABLET | Freq: Four times a day (QID) | ORAL | Status: DC

## 2011-07-18 NOTE — Progress Notes (Signed)
Patient's current  Manual BP is 204/64.  Patient is very concerned.  [facility] called and order rec'd for clonidine rec'd.  Once medication rec'd from pharmacy, will give to patient.  Will recheck BP in 2 hours and if manual SBP greater than 160 with call MD back.  Stacie Glaze 07/18/2011

## 2011-07-18 NOTE — Discharge Summary (Signed)
Patient ID: Alexandra [name] MRN: [id number] DOB/AGE: 1945/08/26 66 y.o.  Admit date: 07/16/2011 Discharge date: 07/18/2011  Primary Care Physician:  [name], MD, MD   Discharge Diagnoses:    Present on Admission:  .ESRD (end stage renal disease) on dialysis .HTN (hypertension), malignant .Hyperparathyroidism, secondary renal .Hyperlipidemia .Anemia of chronic disease .Diabetes mellitus   Medication List  As of 07/18/2011  9:41 AM   STOP taking these medications         OVER THE COUNTER MEDICATION         TAKE these medications         acetaminophen 500 MG tablet   Commonly known as: TYLENOL   Take 500 mg by mouth every 6 (six) hours as needed. For pain      allopurinol 100 MG tablet   Commonly known as: ZYLOPRIM   Take 100 mg by mouth daily.      amLODipine 5 MG tablet   Commonly known as: NORVASC   Take 2 tablets (10 mg total) by mouth at bedtime.      calcium acetate 667 MG capsule   Commonly known as: PHOSLO   Take 1,334 mg by mouth 3 (three) times daily with meals.      cloNIDine 0.1 MG tablet   Commonly known as: CATAPRES   Take 1 tablet (0.1 mg total) by mouth 2 (two) times daily.      Coenzyme Q10 150 MG Caps   Take 300 mg by mouth daily.      colchicine 0.6 MG tablet   Take 0.6 mg by mouth daily as needed. For gout.      docusate sodium 100 MG capsule   Commonly known as: COLACE   Take 200 mg by mouth at bedtime.      hydrALAZINE 25 MG tablet   Commonly known as: APRESOLINE   Take 2 tablets (50 mg total) by mouth 4 (four) times daily.      l-methylfolate-B6-B12 3-35-2 MG Tabs   Commonly known as: METANX   Take 1 tablet by mouth 2 (two) times daily.      losartan 50 MG tablet   Commonly known as: COZAAR   Take 50 mg by mouth at bedtime.      metoprolol succinate 50 MG 24 hr tablet   Commonly known as: TOPROL-XL   Take 2 tablets (100 mg total) by mouth at bedtime.      multivitamin Tabs tablet   Take 1 tablet by mouth daily.        repaglinide 0.5 MG tablet   Commonly known as: PRANDIN   Take 0.5 mg by mouth 3 (three) times daily before meals.             Consults:   None  Significant Diagnostic Studies:     Brief H and P: For complete details please refer to admission H and P, but in brief  Alexandra [name] is an 66 y.o. female is without complaints and presents for her routine Dialysis that is scheduled for Monday Wednesdays and Fridays with Dr. Bascom Levels.    Hospital Course:  Patient was admitted, dialyzed , however she was kept in the hospital because of labile hypertension. Systolic blood pressure was running in the 200s. Patient was given her outpatient medication with no improvement. Adjustment adjustments were made by increasing her hydralazine dose frequency to 4 times a day, increasing metoprolol 100 mg daily and she was continued on her amlodipine and losartan. Blood pressure improved however  continued to be labile. Noted that patient was not cooperative with the staff and refused IV insertion for IV medications. Clonidine was given this morning with significant improvement. I had a long discussion with Mr. Tiburcio Pea and explained to her that we do have the options of maximizing her current antihypertensive regimen before adding a new agent to avoid polypharmacy versus adding clonidine. Patient stated that clonidine is the only medication that works for her so far  therefore will give her a prescription for 0.1 mg twice a day. Pulse rate remained stable between 70s to 80s. Patient was advised to check her blood pressure at least couple of times during the day and keep a log book for her PCP to review and adjust her regimen if needed.  Subjective: Patient seen and examined, she denies any complaints and wants to go home. Filed Vitals:   07/18/11 0848  BP: 158/50  Pulse: 73  Temp: 98.3 F (36.8 C)  Resp: 20    General: Alert, awake, oriented x3, in no acute distress. Heart: Regular rate and  rhythm, without murmurs, rubs, gallops. Lungs: Clear to auscultation bilaterally. Abdomen: Soft, nontender, nondistended, positive bowel sounds. Extremities: No clubbing cyanosis or edema with positive pedal pulses. Neuro: Grossly intact, nonfocal.   Disposition and Follow-up:  To home Followup with PCP ASAP Dialysis as scheduled.   Time spent on Discharge: Approximately 35 minutes   Signed: Teara Duerksen 07/18/2011, 9:41 AM

## 2011-07-19 ENCOUNTER — Encounter (HOSPITAL_COMMUNITY): Payer: Self-pay | Admitting: Emergency Medicine

## 2011-07-19 ENCOUNTER — Observation Stay (HOSPITAL_COMMUNITY)
Admission: EM | Admit: 2011-07-19 | Discharge: 2011-07-20 | Disposition: A | Discharge status: Home / Self Care | Payer: Medicare HMO | Attending: Internal Medicine | Admitting: Internal Medicine

## 2011-07-19 DIAGNOSIS — I10 Essential (primary) hypertension: Secondary | ICD-10-CM | POA: Diagnosis present

## 2011-07-19 DIAGNOSIS — M109 Gout, unspecified: Secondary | ICD-10-CM | POA: Insufficient documentation

## 2011-07-19 DIAGNOSIS — I120 Hypertensive chronic kidney disease with stage 5 chronic kidney disease or end stage renal disease: Secondary | ICD-10-CM | POA: Insufficient documentation

## 2011-07-19 DIAGNOSIS — Z992 Dependence on renal dialysis: Principal | ICD-10-CM | POA: Insufficient documentation

## 2011-07-19 DIAGNOSIS — E119 Type 2 diabetes mellitus without complications: Secondary | ICD-10-CM | POA: Insufficient documentation

## 2011-07-19 DIAGNOSIS — N186 End stage renal disease: Secondary | ICD-10-CM | POA: Insufficient documentation

## 2011-07-19 DIAGNOSIS — D638 Anemia in other chronic diseases classified elsewhere: Secondary | ICD-10-CM | POA: Insufficient documentation

## 2011-07-19 DIAGNOSIS — E785 Hyperlipidemia, unspecified: Secondary | ICD-10-CM | POA: Insufficient documentation

## 2011-07-19 MED ORDER — HEPARIN SODIUM (PORCINE) 1000 UNIT/ML DIALYSIS
20.0000 [IU]/kg | INTRAMUSCULAR | Status: DC | PRN
  Administered 2011-07-20: 1200 [IU] via INTRAVENOUS_CENTRAL
  Filled 2011-07-19: qty 2

## 2011-07-19 NOTE — ED Notes (Signed)
PT. IS HERE FOR HER ROUTINE HEMODIALYSIS , DR. FRAIZIER SEEN PT. AT TRIAGE .

## 2011-07-19 NOTE — ED Notes (Signed)
Called dialysis for estimated time for dialysis with reports "2 a.m.". RN states she has 2 pt receiving dialysis at this time and not due to come off until 2 a.m. Pt will be updated.

## 2011-07-19 NOTE — Consult Note (Signed)
  Pt. In ER she is for a dialysis. See orders.

## 2011-07-20 ENCOUNTER — Encounter (HOSPITAL_COMMUNITY): Payer: Self-pay | Admitting: Internal Medicine

## 2011-07-20 ENCOUNTER — Observation Stay (HOSPITAL_COMMUNITY): Payer: Medicare HMO

## 2011-07-20 DIAGNOSIS — E1129 Type 2 diabetes mellitus with other diabetic kidney complication: Secondary | ICD-10-CM

## 2011-07-20 DIAGNOSIS — I10 Essential (primary) hypertension: Secondary | ICD-10-CM

## 2011-07-20 DIAGNOSIS — N186 End stage renal disease: Secondary | ICD-10-CM

## 2011-07-20 LAB — DIFFERENTIAL
Basophils Relative: 1 % (ref 0–1)
Eosinophils Absolute: 0.2 10*3/uL (ref 0.0–0.7)
Lymphs Abs: 2.2 10*3/uL (ref 0.7–4.0)
Neutro Abs: 3.4 10*3/uL (ref 1.7–7.7)
Neutrophils Relative %: 52 % (ref 43–77)

## 2011-07-20 LAB — RENAL FUNCTION PANEL
Albumin: 3.1 g/dL — ABNORMAL LOW (ref 3.5–5.2)
CO2: 25 mEq/L (ref 19–32)
Chloride: 96 mEq/L (ref 96–112)
Creatinine, Ser: 9.76 mg/dL — ABNORMAL HIGH (ref 0.50–1.10)
GFR calc Af Amer: 4 mL/min — ABNORMAL LOW (ref >90)
GFR calc non Af Amer: 4 mL/min — ABNORMAL LOW (ref >90)
Potassium: 4.6 mEq/L (ref 3.5–5.1)
Sodium: 137 mEq/L (ref 135–145)

## 2011-07-20 LAB — CBC
MCH: 26.6 pg (ref 26.0–34.0)
Platelets: 177 10*3/uL (ref 150–400)
RBC: 4.13 MIL/uL (ref 3.87–5.11)

## 2011-07-20 LAB — GLUCOSE, CAPILLARY: Glucose-Capillary: 152 mg/dL — ABNORMAL HIGH (ref 70–99)

## 2011-07-20 MED ORDER — PARICALCITOL 5 MCG/ML IV SOLN
1.0000 ug | Freq: Once | INTRAVENOUS | Status: AC
  Administered 2011-07-20: 1 ug via INTRAVENOUS

## 2011-07-20 MED ORDER — DOCUSATE SODIUM 100 MG PO CAPS
200.0000 mg | ORAL_CAPSULE | Freq: Once | ORAL | Status: AC
  Administered 2011-07-20: 200 mg via ORAL
  Filled 2011-07-20: qty 2

## 2011-07-20 MED ORDER — PARICALCITOL 5 MCG/ML IV SOLN
INTRAVENOUS | Status: AC
  Administered 2011-07-20: 1 ug via INTRAVENOUS
  Filled 2011-07-20: qty 1

## 2011-07-20 MED ORDER — DARBEPOETIN ALFA-POLYSORBATE 100 MCG/0.5ML IJ SOLN
100.0000 ug | Freq: Once | INTRAMUSCULAR | Status: AC
  Administered 2011-07-20: 100 ug via INTRAVENOUS

## 2011-07-20 MED ORDER — DARBEPOETIN ALFA-POLYSORBATE 100 MCG/0.5ML IJ SOLN
INTRAMUSCULAR | Status: AC
  Administered 2011-07-20: 100 ug via INTRAVENOUS
  Filled 2011-07-20: qty 0.5

## 2011-07-20 MED ORDER — DOCUSATE SODIUM 100 MG PO CAPS: 200.0000 mg | ORAL_CAPSULE | Freq: Every day | ORAL | Status: DC

## 2011-07-20 NOTE — ED Provider Notes (Signed)
History     CSN: [id number]  Arrival date & time 07/19/11  1954   First MD Initiated Contact with Patient 07/19/11 2201      No chief complaint on file.   (Consider location/radiation/quality/duration/timing/severity/associated sxs/prior treatment) HPI  [name] ESRD here for routine dialysis. No complaints at this time. Denies cp/sob/fever/chills. She states "nothing's wrong, I just need my dialysis".   ED Notes, ED Provider Notes from 07/19/11 0000 to 07/19/11 20:13:28       Nada Libman, RN 07/19/2011 20:12      PT. IS HERE FOR HER ROUTINE HEMODIALYSIS , DR. FRAIZIER SEEN PT. AT TRIAGE .     Past Medical History  Diagnosis Date  . Chronic kidney disease     esrd  . Diabetes mellitus   . Hyperlipidemia   . Hypertension   . Renal disorder   . Dialysis care     tues, thurs, sat  . 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 Carotid endarterectomy 2002    left  . Btl   . Rectal fissurectomy   . Av fistula placement 10/30/2010    right brachiocephalic   . Fistulogram     Family History  Problem Relation Age of Onset  . COPD Mother   . Kidney disease Mother   . Heart disease Father   . Lymphoma Son     History  Substance Use Topics  . Smoking status: Current Everyday Smoker -- 1.0 packs/day for 50 years    Types: Cigarettes  . Smokeless tobacco: Never Used  . Alcohol Use: No    OB History    Grav Para Term Preterm Abortions TAB SAB Ect Mult Living                  Review of Systems  All other systems reviewed and are negative.  except as noted HPI  Allergies  Codeine; Epinephrine; and Percocet  Home Medications   Current Outpatient Rx  Name Route Sig Dispense Refill  . ACETAMINOPHEN 500 MG PO TABS Oral Take 500 mg by mouth every 6 (six) hours as needed. For pain    . ALLOPURINOL 100 MG PO TABS Oral Take 100 mg by mouth daily.      Marland Kitchen AMLODIPINE BESYLATE 5 MG PO TABS Oral Take 2 tablets (10 mg total) by mouth  at bedtime. 30 tablet 0  . CALCIUM ACETATE 667 MG PO CAPS Oral Take 1,334 mg by mouth 3 (three) times daily with meals.     Marland Kitchen CLONIDINE HCL 0.1 MG PO TABS Oral Take 0.1 mg by mouth 2 (two) times daily.    Marland Kitchen COENZYME Q10 150 MG PO CAPS Oral Take 300 mg by mouth daily.     . COLCHICINE 0.6 MG PO TABS Oral Take 0.6 mg by mouth daily as needed. For gout.    Marland Kitchen DOCUSATE SODIUM 100 MG PO CAPS Oral Take 200 mg by mouth at bedtime.      Marland Kitchen HYDRALAZINE HCL 25 MG PO TABS Oral Take 50 mg by mouth 4 (four) times daily.    . L-METHYLFOLATE-B6-B12 3-35-2 MG PO TABS Oral Take 1 tablet by mouth 2 (two) times daily.     Marland Kitchen LOSARTAN POTASSIUM 50 MG PO TABS Oral Take 50 mg by mouth at bedtime.     Marland Kitchen METOPROLOL SUCCINATE ER 50 MG PO TB24 Oral Take 100 mg by mouth at bedtime.    Marland Kitchen RENA-VITE PO TABS  Oral Take 1 tablet by mouth daily.      Marland Kitchen REPAGLINIDE 0.5 MG PO TABS Oral Take 0.5 mg by mouth 3 (three) times daily before meals.        BP 165/60  Pulse 66  Temp(Src) 98.3 F (36.8 C) (Oral)  Resp 18  Wt 129 lb (58.514 kg)  SpO2 95%  Physical Exam  Nursing note and vitals reviewed. Constitutional: She is oriented to person, place, and time. She appears well-developed.  HENT:  Head: Atraumatic.  Mouth/Throat: Oropharynx is clear and moist.  Eyes: Conjunctivae and EOM are normal. Pupils are equal, round, and reactive to light.  Neck: Normal range of motion. Neck supple.  Cardiovascular: Normal rate, regular rhythm, normal heart sounds and intact distal pulses.   Pulmonary/Chest: Effort normal and breath sounds normal. No respiratory distress. She has no wheezes. She has no rales.  Abdominal: Soft. She exhibits no distension. There is no tenderness. There is no rebound and no guarding.  Musculoskeletal: Normal range of motion. She exhibits edema.       Pitting edema b/l LE  Neurological: She is alert and oriented to person, place, and time.  Skin: Skin is warm and dry. No rash noted.  Psychiatric: She has a  normal mood and affect.    ED Course  Procedures (including critical care time)  Labs Reviewed - No data to display No results found.   1. End stage renal disease   2. Dialysis patient     MDM  Here for routine dialysis. No complaints at this time. VSS. BP elevated. Per recent care plan patient to be admitted to [facility] hospitalist, labs to be drawn at HD.  Dr. Bascom Levels saw the patient in Triage prior to my evaluation and HD orders have been written.         Forbes Cellar, MD 07/20/11 [phone]

## 2011-07-20 NOTE — ED Notes (Signed)
Admitting MD at bedside, pt awaiting inpt beds assignment.  

## 2011-07-20 NOTE — H&P (Signed)
Alexandra [name] is an 66 y.o. female.   Nephrologist - Dr.Frazier. Chief Complaint: For dialysis. HPI: 66 year-old female with known history of ESRD on hemodialysis, hypertension, diabetes mellitus 2 has come for a routine dialysis. Patient has no specific complaints. Denies any shortness of breath, chest pain. Dr. Bascom Levels nephrologist hs already written orders for dialysis and patient will be in a short while taken for dialysis.   Past Medical History  Diagnosis Date  . Chronic kidney disease     esrd  . Diabetes mellitus   . Hyperlipidemia   . Hypertension   . Renal disorder   . Dialysis care     tues, thurs, sat  . 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 Carotid endarterectomy 2002    left  . Btl   . Rectal fissurectomy   . Av fistula placement 10/30/2010    right brachiocephalic   . Fistulogram     Family History  Problem Relation Age of Onset  . COPD Mother   . Kidney disease Mother   . Heart disease Father   . Lymphoma Son    Social History:  reports that she has been smoking Cigarettes.  She has a 50 pack-year smoking history. She has never used smokeless tobacco. She reports that she does not drink alcohol or use illicit drugs.  Allergies:  Allergies  Allergen Reactions  . Codeine Other (See Comments)    Passes out  . Epinephrine Other (See Comments)    Tachycardia, diaphoresis, syncope  . Percocet (Oxycodone-Acetaminophen) Other (See Comments)    Passes  out     (Not in a hospital admission)  Results for orders placed during the hospital encounter of 07/16/11 (from the past 48 hour(s))  GLUCOSE, CAPILLARY     Status: Normal   Collection Time   07/18/11  7:51 AM      Component Value Range Comment   Glucose-Capillary 77  70 - 99 (mg/dL)   GLUCOSE, CAPILLARY     Status: Abnormal   Collection Time   07/18/11 11:05 AM      Component Value Range Comment   Glucose-Capillary 173 (*) 70 - 99 (mg/dL)    No results  found.  Review of Systems  Constitutional: Negative.   HENT: Negative.   Eyes: Negative.   Respiratory: Negative.   Cardiovascular: Negative.   Gastrointestinal: Negative.   Genitourinary: Negative.   Musculoskeletal: Negative.   Skin: Negative.   Neurological: Negative.   Endo/Heme/Allergies: Negative.   Psychiatric/Behavioral: Negative.     Blood pressure 165/60, pulse 66, temperature 98.3 F (36.8 C), temperature source Oral, resp. rate 18, weight 58.514 kg (129 lb), SpO2 95.00%. Physical Exam  Constitutional: She is oriented to person, place, and time. She appears well-developed and well-nourished. No distress.  HENT:  Head: Normocephalic and atraumatic.  Right Ear: External ear normal.  Left Ear: External ear normal.  Mouth/Throat: No oropharyngeal exudate.  Eyes: Conjunctivae are normal. Pupils are equal, round, and reactive to light. Right eye exhibits no discharge. Left eye exhibits no discharge. No scleral icterus.  Neck: Normal range of motion. Neck supple.  Cardiovascular: Normal rate and regular rhythm.   Respiratory: Effort normal and breath sounds normal. No respiratory distress. She has no wheezes. She has no rales.  GI: Soft. Bowel sounds are normal. She exhibits no distension. There is no tenderness. There is no rebound.  Musculoskeletal: Normal range of motion. She exhibits no edema and  no tenderness.  Neurological: She is alert and oriented to person, place, and time.       Moves all extremities.  Skin: Skin is warm and dry. She is not diaphoretic.  Psychiatric: Her behavior is normal.     Assessment/Plan #1. ESRD on hemodialysis - dialysis per from nephrologist. #2. Hypertension - continue present medications. #3. Diabetes mellitus type 2 - continue home medications and sliding scale. #4. Anemia of chronic disease.  CODE STATUS - full code.  Alexandra Barrack N. 07/20/2011, 12:29 AM

## 2011-07-20 NOTE — ED Notes (Signed)
Pt will be transported to dialysis at this time for further care.

## 2011-07-21 ENCOUNTER — Encounter (HOSPITAL_COMMUNITY): Payer: Self-pay | Admitting: *Deleted

## 2011-07-21 ENCOUNTER — Inpatient Hospital Stay (HOSPITAL_COMMUNITY)
Admission: EM | Admit: 2011-07-21 | Discharge: 2011-07-22 | DRG: 685 | Disposition: A | Discharge status: Home / Self Care | Payer: Medicare HMO | Attending: Emergency Medicine | Admitting: Emergency Medicine

## 2011-07-21 DIAGNOSIS — I120 Hypertensive chronic kidney disease with stage 5 chronic kidney disease or end stage renal disease: Secondary | ICD-10-CM | POA: Diagnosis present

## 2011-07-21 DIAGNOSIS — Z992 Dependence on renal dialysis: Principal | ICD-10-CM

## 2011-07-21 DIAGNOSIS — N186 End stage renal disease: Secondary | ICD-10-CM | POA: Diagnosis present

## 2011-07-21 DIAGNOSIS — E119 Type 2 diabetes mellitus without complications: Secondary | ICD-10-CM

## 2011-07-21 DIAGNOSIS — F17200 Nicotine dependence, unspecified, uncomplicated: Secondary | ICD-10-CM | POA: Diagnosis present

## 2011-07-21 DIAGNOSIS — I10 Essential (primary) hypertension: Secondary | ICD-10-CM

## 2011-07-21 DIAGNOSIS — E785 Hyperlipidemia, unspecified: Secondary | ICD-10-CM | POA: Diagnosis present

## 2011-07-21 DIAGNOSIS — Z79899 Other long term (current) drug therapy: Secondary | ICD-10-CM

## 2011-07-21 DIAGNOSIS — M109 Gout, unspecified: Secondary | ICD-10-CM | POA: Diagnosis present

## 2011-07-21 LAB — GLUCOSE, CAPILLARY: Glucose-Capillary: 90 mg/dL (ref 70–99)

## 2011-07-21 MED ORDER — HEPARIN SODIUM (PORCINE) 1000 UNIT/ML DIALYSIS: 20.0000 [IU]/kg | INTRAMUSCULAR | Status: DC | PRN

## 2011-07-21 NOTE — Consult Note (Signed)
  Pt. In the ER .gained. A little weight. See orders.

## 2011-07-21 NOTE — ED Notes (Signed)
MD at bedside. 

## 2011-07-21 NOTE — ED Notes (Signed)
Pt here for her regular dialysis appointment

## 2011-07-21 NOTE — ED Provider Notes (Signed)
History     CSN: [id number]  Arrival date & time 07/21/11  2026   First MD Initiated Contact with Patient 07/21/11 2138      Chief Complaint  Patient presents with  . Follow-up    dialysis    (Consider location/radiation/quality/duration/timing/severity/associated sxs/prior treatment) HPI Comments: Patient presents to the emergency department for dialysis. Patient denies any acute medical complaints. She does state that she has had 3 bowel movements today which is unusual. She states that she took a stool softener last night. She denies shortness of breath. Lower extremity edema is at baseline.   The history is provided by the patient.    Past Medical History  Diagnosis Date  . Chronic kidney disease     esrd  . Diabetes mellitus   . Hyperlipidemia   . Hypertension   . Renal disorder   . Dialysis care     tues, thurs, sat  . 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 Carotid endarterectomy 2002    left  . Btl   . Rectal fissurectomy   . Av fistula placement 10/30/2010    right brachiocephalic   . Fistulogram     Family History  Problem Relation Age of Onset  . COPD Mother   . Kidney disease Mother   . Heart disease Father   . Lymphoma Son     History  Substance Use Topics  . Smoking status: Current Everyday Smoker -- 1.0 packs/day for 50 years    Types: Cigarettes  . Smokeless tobacco: Never Used  . Alcohol Use: No    OB History    Grav Para Term Preterm Abortions TAB SAB Ect Mult Living                  Review of Systems  Constitutional: Negative for fever.  HENT: Negative for sore throat and rhinorrhea.   Respiratory: Negative for shortness of breath.   Cardiovascular: Positive for leg swelling. Negative for chest pain.  Gastrointestinal: Negative for nausea, vomiting, abdominal pain and diarrhea.    Allergies  Codeine; Epinephrine; and Percocet  Home Medications   Current Outpatient Rx  Name Route Sig  Dispense Refill  . ACETAMINOPHEN 500 MG PO TABS Oral Take 500 mg by mouth every 6 (six) hours as needed. For pain    . ALLOPURINOL 100 MG PO TABS Oral Take 100 mg by mouth daily.      Marland Kitchen AMLODIPINE BESYLATE 5 MG PO TABS Oral Take 2 tablets (10 mg total) by mouth at bedtime. 30 tablet 0  . CALCIUM ACETATE 667 MG PO CAPS Oral Take 1,334 mg by mouth 3 (three) times daily with meals.     Marland Kitchen CLONIDINE HCL 0.1 MG PO TABS Oral Take 0.1 mg by mouth 2 (two) times daily.    Marland Kitchen COENZYME Q10 150 MG PO CAPS Oral Take 300 mg by mouth daily.     . COLCHICINE 0.6 MG PO TABS Oral Take 0.6 mg by mouth daily as needed. For gout.    Marland Kitchen DOCUSATE SODIUM 100 MG PO CAPS Oral Take 200 mg by mouth at bedtime.      Marland Kitchen HYDRALAZINE HCL 25 MG PO TABS Oral Take 50 mg by mouth 4 (four) times daily.    . L-METHYLFOLATE-B6-B12 3-35-2 MG PO TABS Oral Take 1 tablet by mouth 2 (two) times daily.     Marland Kitchen LOSARTAN POTASSIUM 50 MG PO TABS Oral Take 50 mg by  mouth at bedtime.     Marland Kitchen METOPROLOL SUCCINATE ER 50 MG PO TB24 Oral Take 100 mg by mouth at bedtime.    Marland Kitchen RENA-VITE PO TABS Oral Take 1 tablet by mouth daily.      Marland Kitchen REPAGLINIDE 0.5 MG PO TABS Oral Take 0.5 mg by mouth 3 (three) times daily before meals.        BP 142/44  Pulse 72  Temp(Src) 98.5 F (36.9 C) (Oral)  Resp 18  SpO2 98%  Physical Exam  Nursing note and vitals reviewed. Constitutional: She appears well-developed and well-nourished.  HENT:  Head: Normocephalic and atraumatic.  Eyes: Conjunctivae are normal.  Neck: Normal range of motion. Neck supple.  Cardiovascular: Normal rate and regular rhythm.   Murmur (II/VI systolic murmur at LSB) heard. Pulmonary/Chest: No respiratory distress.  Musculoskeletal: She exhibits edema. She exhibits no tenderness.       2+ pitting edema bilaterally to knees  Neurological: She is alert.  Skin: Skin is warm and dry.  Psychiatric: She has a normal mood and affect.    ED Course  Procedures (including critical care  time)   Labs Reviewed  GLUCOSE, CAPILLARY   No results found.   1. End stage renal disease   2. Hypertension     10:07 PM Patient seen and examined. Orders placed by Dr. Bascom Levels. Patient has no new acute medical complaints. She denies symptoms of fluid overload. Will call [facility] for Obs admission.   Vital signs reviewed and are as follows: Filed Vitals:   07/21/11 2151  BP: 142/44  Pulse: 72  Temp: 98.5 F (36.9 C)  Resp: 18   10:14 PM [facility] to admit.    MDM  Obs admit for dialysis.         Renne Crigler, [place] 07/21/11 2217

## 2011-07-21 NOTE — H&P (Signed)
PCP:   [name], MD, MD  [facility]  Chief Complaint:   Need HD  HPI: Alexandra [name] is a 66 y.o. female   has a past medical history of Chronic kidney disease; Diabetes mellitus; Hyperlipidemia; Hypertension; Renal disorder; Dialysis care; and Gout due to renal impairment.   Presented with  No complaints here for her scheduled HD  Review of Systems:    Pertinent positives include: none  Constitutional:  No weight loss, night sweats, Fevers, chills, fatigue, weight loss  HEENT:  No headaches, Difficulty swallowing,Tooth/dental problems,Sore throat,  No sneezing, itching, ear ache, nasal congestion, post nasal drip,  Cardio-vascular:  No chest pain, Orthopnea, PND, anasarca, dizziness, palpitations.no Bilateral lower extremity swelling  GI:  No heartburn, indigestion, abdominal pain, nausea, vomiting, diarrhea, change in bowel habits, loss of appetite, melena, blood in stool, hematemesis Resp:  no shortness of breath at rest. No dyspnea on exertion, No excess mucus, no productive cough, No non-productive cough, No coughing up of blood.No change in color of mucus.No wheezing. Skin:  no rash or lesions. No jaundice GU:  no dysuria, change in color of urine, no urgency or frequency. No straining to urinate.  No flank pain.  Musculoskeletal:  No joint pain or no joint swelling. No decreased range of motion. No back pain.  Psych:  No change in mood or affect. No depression or anxiety. No memory loss.  Neuro: no localizing neurological complaints, no tingling, no weakness, no double vision, no gait abnormality, no slurred speech, no confusion  Otherwise ROS are negative except for above, 10 systems were reviewed  Past Medical History: Past Medical History  Diagnosis Date  . Chronic kidney disease     esrd  . Diabetes mellitus   . Hyperlipidemia   . Hypertension   . Renal disorder   . Dialysis care     tues, thurs, sat  . Gout due to renal impairment      Past Surgical History  Procedure Date  . Carotid endarterectomy 2002    left  . Btl   . Rectal fissurectomy   . Av fistula placement 10/30/2010    right brachiocephalic   . Fistulogram      Medications: Prior to Admission medications   Medication Sig Start Date End Date Taking? Authorizing Provider  acetaminophen (TYLENOL) 500 MG tablet Take 500 mg by mouth every 6 (six) hours as needed. For pain   Yes Historical Provider, MD  allopurinol (ZYLOPRIM) 100 MG tablet Take 100 mg by mouth daily.     Yes Historical Provider, MD  amLODipine (NORVASC) 5 MG tablet Take 2 tablets (10 mg total) by mouth at bedtime. 07/13/11  Yes [name], MD  calcium acetate (PHOSLO) 667 MG capsule Take 1,334 mg by mouth 3 (three) times daily with meals.    Yes Historical Provider, MD  cloNIDine (CATAPRES) 0.1 MG tablet Take 0.1 mg by mouth 2 (two) times daily. 07/18/11 07/17/12 Yes [name], MD  Coenzyme Q10 150 MG CAPS Take 300 mg by mouth daily.    Yes Historical Provider, MD  colchicine 0.6 MG tablet Take 0.6 mg by mouth daily as needed. For gout.   Yes Historical Provider, MD  docusate sodium (COLACE) 100 MG capsule Take 200 mg by mouth at bedtime.     Yes Historical Provider, MD  hydrALAZINE (APRESOLINE) 25 MG tablet Take 50 mg by mouth 4 (four) times daily. 07/18/11  Yes [name], MD  l-methylfolate-B6-B12 (METANX) 3-35-2 MG TABS  Take 1 tablet by mouth 2 (two) times daily.    Yes Historical Provider, MD  losartan (COZAAR) 50 MG tablet Take 50 mg by mouth at bedtime.    Yes Historical Provider, MD  metoprolol succinate (TOPROL-XL) 50 MG 24 hr tablet Take 100 mg by mouth at bedtime. 07/18/11  Yes Antonieta Pert, MD  multivitamin (RENA-VIT) TABS tablet Take 1 tablet by mouth daily.     Yes Historical Provider, MD  repaglinide (PRANDIN) 0.5 MG tablet Take 0.5 mg by mouth 3 (three) times daily before meals.     Yes Historical Provider, MD    Allergies:   Allergies  Allergen Reactions  .  Codeine Other (See Comments)    Passes out  . Epinephrine Other (See Comments)    Tachycardia, diaphoresis, syncope  . Percocet (Oxycodone-Acetaminophen) Other (See Comments)    Passes  out    Social History:   reports that she has been smoking Cigarettes.  She has a 50 pack-year smoking history. She has never used smokeless tobacco. She reports that she does not drink alcohol or use illicit drugs.   Family History: family history includes COPD in her mother; Heart disease in her father; Kidney disease in her mother; and Lymphoma in her son.    Physical Exam: Patient Vitals for the past 24 hrs:  BP Temp Temp src Pulse Resp SpO2  07/21/11 2220 155/48 mmHg - - 74  - 99 %  07/21/11 2200 146/47 mmHg - - 72  - 97 %  07/21/11 2151 142/44 mmHg 98.5 F (36.9 C) Oral 72  18  98 %  07/21/11 2032 162/83 mmHg 98.2 F (36.8 C) Oral 88  18  100 %    1. General:  in No Acute distress 2. Psychological: Alert andOriented 3. Head/ENT:   MoistMucous Membranes                          Head Non traumatic, neck supple                          Normal  Dentition 4. SKIN: normal Skin turgor,  Skin clean Dry and intact no rash 5. Heart: Regular rate and rhythm no Murmur, Rub or gallop 6. Lungs: Clear to auscultation bilaterally, no wheezes or crackles   7. Abdomen: Soft, non-tender, Non distended 8. Lower extremities: no clubbing, cyanosis, or edema 9. Neurologically Grossly intact, moving all 4 extremities equally 10. MSK: Normal range of motion  body mass index is unknown because there is no height or weight on file.   Labs on Admission:   Basename 07/20/11 0400  NA 137  K 4.6  CL 96  CO2 25  GLUCOSE 132*  BUN 77*  CREATININE 9.76*  CALCIUM 9.3  MG --  PHOS 3.6    Basename 07/20/11 0400  AST --  ALT --  ALKPHOS --  BILITOT --  PROT --  ALBUMIN 3.1*   No results found for this basename: LIPASE:2,AMYLASE:2 in the last 72 hours  Basename 07/20/11 0401  WBC 6.6  NEUTROABS  3.4  HGB 11.0*  HCT 34.8*  MCV 84.3  PLT 177   No results found for this basename: CKTOTAL:3,CKMB:3,CKMBINDEX:3,TROPONINI:3 in the last 72 hours No results found for this basename: TSH,T4TOTAL,FREET3,T3FREE,THYROIDAB in the last 72 hours No results found for this basename: VITAMINB12:2,FOLATE:2,FERRITIN:2,TIBC:2,IRON:2,RETICCTPCT:2 in the last 72 hours Lab Results  Component Value Date   HGBA1C 5.4 05/16/2011  The CrCl is unknown because both a height and weight (above a minimum accepted value) are required for this calculation. ABG    Component Value Date/Time   TCO2 29 06/10/2010 0739     No results found for this basename: DDIMER     Other results:   Cultures:    Component Value Date/Time   SDES URINE, CLEAN CATCH 08/20/2009 2114   SPECREQUEST NONE 08/20/2009 2114   CULT NO GROWTH 08/20/2009 2114   REPTSTATUS 08/22/2009 FINAL 08/20/2009 2114       Radiological Exams on Admission: No results found.  Assessment/Plan  66 yo here for scheduled HD  Present on Admission:  .ESRD (end stage renal disease) on dialysis - as per renal .Diabetes mellitus - SSI   Prophylaxis: SCD   CODE STATUS: Full CODE  I have spent a total of 30 min on this admission  Brodric Schauer 07/21/2011, 10:48 PM

## 2011-07-22 ENCOUNTER — Inpatient Hospital Stay (HOSPITAL_COMMUNITY): Payer: Medicare HMO

## 2011-07-22 LAB — DIFFERENTIAL
Basophils Absolute: 0 10*3/uL (ref 0.0–0.1)
Basophils Relative: 0 % (ref 0–1)
Lymphs Abs: 2.2 10*3/uL (ref 0.7–4.0)
Monocytes Relative: 13 % — ABNORMAL HIGH (ref 3–12)
Neutro Abs: 4.1 10*3/uL (ref 1.7–7.7)

## 2011-07-22 LAB — RENAL FUNCTION PANEL
BUN: 56 mg/dL — ABNORMAL HIGH (ref 6–23)
CO2: 25 mEq/L (ref 19–32)
GFR calc Af Amer: 5 mL/min — ABNORMAL LOW (ref >90)
Glucose, Bld: 130 mg/dL — ABNORMAL HIGH (ref 70–99)
Phosphorus: 3.5 mg/dL (ref 2.3–4.6)
Potassium: 4.1 mEq/L (ref 3.5–5.1)
Sodium: 136 mEq/L (ref 135–145)

## 2011-07-22 LAB — CBC
HCT: 34.5 % — ABNORMAL LOW (ref 36.0–46.0)
Hemoglobin: 10.9 g/dL — ABNORMAL LOW (ref 12.0–15.0)
MCHC: 31.6 g/dL (ref 30.0–36.0)
MCV: 85 fL (ref 78.0–100.0)
RDW: 20.1 % — ABNORMAL HIGH (ref 11.5–15.5)
WBC: 7.3 10*3/uL (ref 4.0–10.5)

## 2011-07-22 LAB — GLUCOSE, CAPILLARY
Glucose-Capillary: 59 mg/dL — ABNORMAL LOW (ref 70–99)
Glucose-Capillary: 156 mg/dL — ABNORMAL HIGH (ref 70–99)

## 2011-07-22 LAB — HEMOGLOBIN A1C: Mean Plasma Glucose: 94 mg/dL (ref <117)

## 2011-07-22 MED ORDER — INSULIN ASPART 100 UNIT/ML ~~LOC~~ SOLN: 0.0000 [IU] | Freq: Three times a day (TID) | SUBCUTANEOUS | Status: DC

## 2011-07-22 MED ORDER — AMLODIPINE BESYLATE 10 MG PO TABS: 10.0000 mg | ORAL_TABLET | Freq: Every day | ORAL | Status: DC

## 2011-07-22 MED ORDER — DOCUSATE SODIUM 100 MG PO CAPS
200.0000 mg | ORAL_CAPSULE | Freq: Once | ORAL | Status: AC
  Administered 2011-07-22: 200 mg via ORAL
  Filled 2011-07-22: qty 2

## 2011-07-22 MED ORDER — ALLOPURINOL 100 MG PO TABS: 100.0000 mg | ORAL_TABLET | Freq: Every day | ORAL | Status: DC

## 2011-07-22 MED ORDER — INSULIN ASPART 100 UNIT/ML ~~LOC~~ SOLN: 0.0000 [IU] | Freq: Every day | SUBCUTANEOUS | Status: DC

## 2011-07-22 MED ORDER — HYDRALAZINE HCL 50 MG PO TABS
50.0000 mg | ORAL_TABLET | Freq: Four times a day (QID) | ORAL | Status: DC
  Filled 2011-07-22 (×3): qty 1

## 2011-07-22 MED ORDER — LOSARTAN POTASSIUM 50 MG PO TABS: 50.0000 mg | ORAL_TABLET | Freq: Every day | ORAL | Status: DC

## 2011-07-22 MED ORDER — DARBEPOETIN ALFA-POLYSORBATE 100 MCG/0.5ML IJ SOLN
INTRAMUSCULAR | Status: AC
  Administered 2011-07-22: 100 ug
  Filled 2011-07-22: qty 0.5

## 2011-07-22 MED ORDER — SODIUM CHLORIDE 0.9 % IJ SOLN: 3.0000 mL | Freq: Two times a day (BID) | INTRAMUSCULAR | Status: DC

## 2011-07-22 MED ORDER — CALCIUM ACETATE 667 MG PO CAPS
1334.0000 mg | ORAL_CAPSULE | Freq: Three times a day (TID) | ORAL | Status: DC
  Administered 2011-07-22: 1334 mg via ORAL
  Filled 2011-07-22 (×3): qty 2

## 2011-07-22 MED ORDER — METOPROLOL SUCCINATE ER 100 MG PO TB24: 100.0000 mg | ORAL_TABLET | Freq: Every day | ORAL | Status: DC

## 2011-07-22 MED ORDER — L-METHYLFOLATE-B6-B12 3-35-2 MG PO TABS: 1.0000 | ORAL_TABLET | Freq: Two times a day (BID) | ORAL | Status: DC

## 2011-07-22 MED ORDER — REPAGLINIDE 0.5 MG PO TABS
0.5000 mg | ORAL_TABLET | Freq: Three times a day (TID) | ORAL | Status: DC
  Administered 2011-07-22: 0.5 mg via ORAL
  Filled 2011-07-22 (×3): qty 1

## 2011-07-22 MED ORDER — PARICALCITOL 5 MCG/ML IV SOLN
INTRAVENOUS | Status: AC
  Administered 2011-07-22: 1 ug
  Filled 2011-07-22: qty 1

## 2011-07-22 MED ORDER — DOCUSATE SODIUM 100 MG PO CAPS: 200.0000 mg | ORAL_CAPSULE | Freq: Every day | ORAL | Status: DC

## 2011-07-22 MED ORDER — L-METHYLFOLATE-B6-B12 3-35-2 MG PO TABS
1.0000 | ORAL_TABLET | Freq: Once | ORAL | Status: DC
  Filled 2011-07-22: qty 1

## 2011-07-22 MED ORDER — ACETAMINOPHEN 500 MG PO TABS: 500.0000 mg | ORAL_TABLET | Freq: Four times a day (QID) | ORAL | Status: DC | PRN

## 2011-07-22 MED ORDER — CLONIDINE HCL 0.1 MG PO TABS: 0.1000 mg | ORAL_TABLET | Freq: Two times a day (BID) | ORAL | Status: DC

## 2011-07-22 MED ORDER — RENA-VITE PO TABS: 1.0000 | ORAL_TABLET | Freq: Every day | ORAL | Status: DC

## 2011-07-22 NOTE — ED Provider Notes (Signed)
Medical screening examination/treatment/procedure(s) were performed by non-physician practitioner and as supervising physician I was immediately available for consultation/collaboration.   Benny Lennert, MD 07/22/11 1538

## 2011-07-22 NOTE — ED Notes (Signed)
CBG completed. 156.

## 2011-07-22 NOTE — Discharge Summary (Signed)
Physician Discharge Summary  Alexandra [name] RUE:[id number] DOB: 1945/10/08 DOA: 07/21/2011  PCP: [name], MD, MD  Admit date: 07/21/2011 Discharge date: 07/22/2011  Discharge Diagnoses:  ESRD (end stage renal disease) on dialysis (06/12/2011) Diabetes mellitus (06/12/2011)    History of present illness:  Patient admitted for dialysis.   Hospital Course:  Patient admitted for dialysis.   Discharge Exam: Filed Vitals:   07/22/11 1230  BP: 177/74  Pulse: 80  Temp:   Resp: 16   Filed Vitals:   07/22/11 1100 07/22/11 1130 07/22/11 1200 07/22/11 1230  BP: 157/56 191/70 159/58 177/74  Pulse: 74 81 84 80  Temp:      TempSrc:      Resp: 16 18 18 16   Weight:      SpO2:       General: no distress. Cardiovascular: S1, S2, RRR Respiratory: CTA.   Discharge Instructions   Medication List  As of 07/22/2011  1:50 PM   ASK your doctor about these medications         acetaminophen 500 MG tablet   Commonly known as: TYLENOL   Take 500 mg by mouth every 6 (six) hours as needed. For pain      allopurinol 100 MG tablet   Commonly known as: ZYLOPRIM   Take 100 mg by mouth daily.      amLODipine 5 MG tablet   Commonly known as: NORVASC   Take 2 tablets (10 mg total) by mouth at bedtime.      calcium acetate 667 MG capsule   Commonly known as: PHOSLO   Take 1,334 mg by mouth 3 (three) times daily with meals.      cloNIDine 0.1 MG tablet   Commonly known as: CATAPRES   Take 0.1 mg by mouth 2 (two) times daily.      Coenzyme Q10 150 MG Caps   Take 300 mg by mouth daily.      colchicine 0.6 MG tablet   Take 0.6 mg by mouth daily as needed. For gout.      docusate sodium 100 MG capsule   Commonly known as: COLACE   Take 200 mg by mouth at bedtime.      hydrALAZINE 25 MG tablet   Commonly known as: APRESOLINE   Take 50 mg by mouth 4 (four) times daily.      l-methylfolate-B6-B12 3-35-2 MG Tabs   Commonly known as: METANX   Take 1 tablet by mouth 2 (two) times  daily.      losartan 50 MG tablet   Commonly known as: COZAAR   Take 50 mg by mouth at bedtime.      metoprolol succinate 50 MG 24 hr tablet   Commonly known as: TOPROL-XL   Take 100 mg by mouth at bedtime.      multivitamin Tabs tablet   Take 1 tablet by mouth daily.      repaglinide 0.5 MG tablet   Commonly known as: PRANDIN   Take 0.5 mg by mouth 3 (three) times daily before meals.              The results of significant diagnostics from this hospitalization (including imaging, microbiology, ancillary and laboratory) are listed below for reference.    Significant Diagnostic Studies: Dg Chest 2 View  07/15/2011  **ADDENDUM** CREATED: 07/15/2011 15:02:42  Requested addendum: No radiographic evidence of TB identified.  **END ADDENDUM** SIGNED BY: Loraine Leriche A. Tyron Russell, M.D.    07/12/2011  *RADIOLOGY REPORT*  Clinical Data:  Dialysis patient, history chronic kidney disease, diabetes, hypertension, hyperlipidemia, gout  CHEST - 2 VIEW  Comparison: 01/16/2010  Findings: Right jugular central venous catheter, tip projecting over SVC. Upper-normal size of cardiac silhouette. Mediastinal contours and pulmonary vascularity normal. Lungs appear emphysematous but clear. No pleural effusion or pneumothorax. Bones appear diffusely demineralized. Minimal anterior height loss of a vertebra at the thoracolumbar junction, unchanged.  IMPRESSION: Emphysematous changes. No acute abnormalities.  Original Report Authenticated By: Lollie Marrow, M.D.     Labs: Basic Metabolic Panel:  Lab 07/20/11 1610 07/16/11 2257  NA 137 140  K 4.6 4.1  CL 96 99  CO2 25 30  GLUCOSE 132* 88  BUN 77* 40*  CREATININE 9.76* 6.53*  CALCIUM 9.3 9.6  MG -- --  PHOS 3.6 2.7   Liver Function Tests:  Lab 07/20/11 0400 07/16/11 2257  AST -- --  ALT -- --  ALKPHOS -- --  BILITOT -- --  PROT -- --  ALBUMIN 3.1* 3.2*   CBC:  Lab 07/20/11 0401 07/16/11 2257  WBC 6.6 7.3  NEUTROABS 3.4 --  HGB 11.0* 11.2*  HCT  34.8* 35.6*  MCV 84.3 85.4  PLT 177 196   CBG:  Lab 07/22/11 1312 07/22/11 0841 07/22/11 0734 07/21/11 2208 07/20/11 0038  GLUCAP 89 156* 59* 90 152*      Signed:  Candace Ramus  [facility] 07/22/2011, 1:50 PM

## 2011-07-22 NOTE — ED Notes (Signed)
Meal tray given. Pt sts unable to eat until she receives her prandin and phoslo. Pharmacy contacted for medications.

## 2011-07-22 NOTE — ED Notes (Signed)
[id number] Ready 

## 2011-07-22 NOTE — ED Notes (Signed)
Pt given juice to raise blood sugar  Pt also refuses to be placed on CR monitor

## 2011-07-22 NOTE — ED Notes (Signed)
Pt frustrated by delay in going to dialysis, extensive service recovery performed, ED director notified and to pt's bedside, breakfast tray ordered, AM meds confirmed and ordered from pharmacy, pt's linens changed, dialysis called to confirm when pt will be able to go up, dialysis reports they will call for pt between 8:30 and 9:00

## 2011-07-23 ENCOUNTER — Emergency Department (HOSPITAL_COMMUNITY): Payer: Medicare HMO

## 2011-07-23 ENCOUNTER — Observation Stay (HOSPITAL_COMMUNITY)
Admission: EM | Admit: 2011-07-23 | Discharge: 2011-07-24 | Disposition: A | Discharge status: Home / Self Care | Payer: Medicare HMO | Attending: Internal Medicine | Admitting: Internal Medicine

## 2011-07-23 ENCOUNTER — Encounter (HOSPITAL_COMMUNITY): Payer: Self-pay | Admitting: *Deleted

## 2011-07-23 DIAGNOSIS — Z992 Dependence on renal dialysis: Secondary | ICD-10-CM

## 2011-07-23 DIAGNOSIS — I120 Hypertensive chronic kidney disease with stage 5 chronic kidney disease or end stage renal disease: Secondary | ICD-10-CM | POA: Insufficient documentation

## 2011-07-23 DIAGNOSIS — D638 Anemia in other chronic diseases classified elsewhere: Secondary | ICD-10-CM | POA: Diagnosis present

## 2011-07-23 DIAGNOSIS — E213 Hyperparathyroidism, unspecified: Secondary | ICD-10-CM

## 2011-07-23 DIAGNOSIS — N2581 Secondary hyperparathyroidism of renal origin: Secondary | ICD-10-CM | POA: Diagnosis present

## 2011-07-23 DIAGNOSIS — E785 Hyperlipidemia, unspecified: Secondary | ICD-10-CM | POA: Insufficient documentation

## 2011-07-23 DIAGNOSIS — N186 End stage renal disease: Secondary | ICD-10-CM | POA: Insufficient documentation

## 2011-07-23 DIAGNOSIS — E119 Type 2 diabetes mellitus without complications: Secondary | ICD-10-CM

## 2011-07-23 DIAGNOSIS — I10 Essential (primary) hypertension: Secondary | ICD-10-CM

## 2011-07-23 LAB — RENAL FUNCTION PANEL
BUN: 43 mg/dL — ABNORMAL HIGH (ref 6–23)
CO2: 28 mEq/L (ref 19–32)
Chloride: 97 mEq/L (ref 96–112)
Creatinine, Ser: 7.14 mg/dL — ABNORMAL HIGH (ref 0.50–1.10)
Glucose, Bld: 178 mg/dL — ABNORMAL HIGH (ref 70–99)

## 2011-07-23 LAB — CBC
HCT: 35.5 % — ABNORMAL LOW (ref 36.0–46.0)
MCV: 84.5 fL (ref 78.0–100.0)
RBC: 4.2 MIL/uL (ref 3.87–5.11)
WBC: 6.9 10*3/uL (ref 4.0–10.5)

## 2011-07-23 MED ORDER — DARBEPOETIN ALFA-POLYSORBATE 100 MCG/0.5ML IJ SOLN
100.0000 ug | Freq: Once | INTRAMUSCULAR | Status: DC
  Filled 2011-07-23: qty 0.5

## 2011-07-23 MED ORDER — ENOXAPARIN SODIUM 40 MG/0.4ML ~~LOC~~ SOLN: 40.0000 mg | SUBCUTANEOUS | Status: DC

## 2011-07-23 MED ORDER — ONDANSETRON HCL 4 MG/2ML IJ SOLN: 4.0000 mg | Freq: Four times a day (QID) | INTRAMUSCULAR | Status: DC | PRN

## 2011-07-23 MED ORDER — ONDANSETRON HCL 4 MG PO TABS: 4.0000 mg | ORAL_TABLET | Freq: Four times a day (QID) | ORAL | Status: DC | PRN

## 2011-07-23 MED ORDER — DARBEPOETIN ALFA-POLYSORBATE 100 MCG/0.5ML IJ SOLN
INTRAMUSCULAR | Status: AC
  Filled 2011-07-23: qty 0.5

## 2011-07-23 MED ORDER — SODIUM CHLORIDE 0.9 % IV SOLN: 250.0000 mL | INTRAVENOUS | Status: DC | PRN

## 2011-07-23 MED ORDER — ACETAMINOPHEN 650 MG RE SUPP: 650.0000 mg | Freq: Four times a day (QID) | RECTAL | Status: DC | PRN

## 2011-07-23 MED ORDER — HEPARIN SODIUM (PORCINE) 1000 UNIT/ML DIALYSIS
20.0000 [IU]/kg | INTRAMUSCULAR | Status: DC | PRN
  Filled 2011-07-23: qty 2

## 2011-07-23 MED ORDER — PARICALCITOL 5 MCG/ML IV SOLN
1.0000 ug | Freq: Once | INTRAVENOUS | Status: AC
  Administered 2011-07-23: 1 ug via INTRAVENOUS
  Filled 2011-07-23: qty 0.2

## 2011-07-23 MED ORDER — PARICALCITOL 5 MCG/ML IV SOLN
INTRAVENOUS | Status: AC
  Filled 2011-07-23: qty 1

## 2011-07-23 MED ORDER — HYDROMORPHONE HCL PF 1 MG/ML IJ SOLN: 0.5000 mg | INTRAMUSCULAR | Status: DC | PRN

## 2011-07-23 MED ORDER — SODIUM CHLORIDE 0.9 % IJ SOLN: 3.0000 mL | INTRAMUSCULAR | Status: DC | PRN

## 2011-07-23 MED ORDER — SODIUM CHLORIDE 0.9 % IJ SOLN: 3.0000 mL | Freq: Two times a day (BID) | INTRAMUSCULAR | Status: DC

## 2011-07-23 MED ORDER — HEPARIN SODIUM (PORCINE) 1000 UNIT/ML DIALYSIS: 20.0000 [IU]/kg | INTRAMUSCULAR | Status: DC | PRN

## 2011-07-23 NOTE — ED Notes (Signed)
Dr. Bascom Levels is aware that this pt is in the ED, and would like the EDP to contact [facility].

## 2011-07-23 NOTE — H&P (Signed)
DATE OF ADMISSION:  07/23/2011  PCP:   [name], MD, MD   Chief Complaint: Needs Dialysis   HPI: Alexandra [name] is an 66 y.o. female presents for dialysis she is now on a Monday Wednesday Friday schedule.  She was seen in the ED and her dialysis orders were written and she was evaluated by Renal Dr. Jeri Cos.  She is without complaints at this time.    Past Medical History  Diagnosis Date  . Chronic kidney disease     esrd  . Diabetes mellitus   . Hyperlipidemia   . Hypertension   . Renal disorder   . Dialysis care     tues, thurs, sat  . 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 Carotid endarterectomy 2002    left  . Btl   . Rectal fissurectomy   . Av fistula placement 10/30/2010    right brachiocephalic   . Fistulogram     Medications:  HOME MEDS: Prior to Admission medications   Medication Sig Start Date End Date Taking? Authorizing Provider  acetaminophen (TYLENOL) 500 MG tablet Take 500 mg by mouth every 6 (six) hours as needed. For pain   Yes Historical Provider, MD  allopurinol (ZYLOPRIM) 100 MG tablet Take 100 mg by mouth daily.     Yes Historical Provider, MD  amLODipine (NORVASC) 5 MG tablet Take 2 tablets (10 mg total) by mouth at bedtime. 07/13/11  Yes [name], MD  calcium acetate (PHOSLO) 667 MG capsule Take 1,334 mg by mouth 3 (three) times daily with meals.    Yes Historical Provider, MD  cloNIDine (CATAPRES) 0.1 MG tablet Take 0.1 mg by mouth 2 (two) times daily. 07/18/11 07/17/12 Yes [name], MD  Coenzyme Q10 150 MG CAPS Take 300 mg by mouth daily.    Yes Historical Provider, MD  colchicine 0.6 MG tablet Take 0.6 mg by mouth daily as needed. For gout.   Yes Historical Provider, MD  docusate sodium (COLACE) 100 MG capsule Take 200 mg by mouth at bedtime.     Yes Historical Provider, MD  hydrALAZINE (APRESOLINE) 25 MG tablet Take 50 mg by mouth 4 (four) times daily. 07/18/11  Yes [name], MD  l-methylfolate-B6-B12 (METANX) 3-35-2 MG TABS Take 1 tablet by mouth 2 (two) times daily.    Yes Historical Provider, MD  losartan (COZAAR) 50 MG tablet Take 50 mg by mouth at bedtime.    Yes Historical Provider, MD  metoprolol succinate (TOPROL-XL) 50 MG 24 hr tablet Take 100 mg by mouth at bedtime. 07/18/11  Yes Antonieta Pert, MD  multivitamin (RENA-VIT) TABS tablet Take 1 tablet by mouth daily.     Yes Historical Provider, MD  repaglinide (PRANDIN) 0.5 MG tablet Take 0.5 mg by mouth 3 (three) times daily before meals.     Yes Historical Provider, MD    Allergies:  Allergies  Allergen Reactions  . Codeine Other (See Comments)    Passes out  . Epinephrine Other (See Comments)    Tachycardia, diaphoresis, syncope  . Percocet (Oxycodone-Acetaminophen) Other (See Comments)    Passes  out    Social History:   reports that she has been smoking Cigarettes.  She has a 50 pack-year smoking history. She has never used smokeless tobacco. She reports that she does not drink alcohol or use illicit drugs.  Family History: Family History  Problem Relation Age of Onset  .  COPD Mother   . Kidney disease Mother   . Heart disease Father   . Lymphoma Son     Review of Systems:  The patient denies anorexia, fever, weight loss,, vision loss, decreased hearing, hoarseness, chest pain, syncope, dyspnea on exertion, peripheral edema, balance deficits, hemoptysis, abdominal pain, melena, hematochezia, severe indigestion/heartburn, hematuria, incontinence, genital sores, muscle weakness, suspicious skin lesions, transient blindness, difficulty walking, depression, unusual weight change, abnormal bleeding, enlarged lymph nodes, angioedema, and breast masses.   Physical Exam:  GEN:  Pleasant examined  and in no acute distress; cooperative with exam Filed Vitals:   07/23/11 2017 07/23/11 2029 07/23/11 2033 07/23/11 2100  BP: 182/70 209/63 178/66 157/87  Pulse: 79 78 77 82  Temp: 98 F  (36.7 C)     TempSrc: Oral     Resp: 16 16 16 16   Weight: 57.5 kg (126 lb 12.2 oz)     SpO2: 98%      Blood pressure 157/87, pulse 82, temperature 98 F (36.7 C), temperature source Oral, resp. rate 16, weight 57.5 kg (126 lb 12.2 oz), SpO2 98.00%. PSYCH: SHe is alert and oriented x4; does not appear anxious does not appear depressed; affect is normal HEENT: Normocephalic and Atraumatic, Mucous membranes pink; PERRLA; EOM intact; Fundi:  Benign;  No scleral icterus, Nares: Patent, Oropharynx: Clear, Edentulous or Fair Dentition, Neck:  FROM, no cervical lymphadenopathy nor thyromegaly or carotid bruit; no JVD; Breasts:: Not examined CHEST WALL: No tenderness CHEST: Normal respiration, clear to auscultation bilaterally HEART: Regular rate and rhythm; no murmurs rubs or gallops BACK: No kyphosis or scoliosis; no CVA tenderness ABDOMEN: Positive Bowel Sounds, Scaphoid, Obese, soft non-tender; no masses, no organomegaly, no pannus; no intertriginous candida. Rectal Exam: Not done EXTREMITIES: No bone or joint deformity; age-appropriate arthropathy of the hands and knees; no cyanosis, clubbing or edema; no ulcerations. Genitalia: not examined PULSES: 2+ and symmetric SKIN: Normal hydration no rash or ulceration CNS: Cranial nerves 2-12 grossly intact no focal neurologic deficit   Labs & Imaging Results for orders placed during the hospital encounter of 07/21/11 (from the past 48 hour(s))  GLUCOSE, CAPILLARY     Status: Normal   Collection Time   07/21/11 10:08 PM      Component Value Range Comment   Glucose-Capillary 90  70 - 99 (mg/dL)   GLUCOSE, CAPILLARY     Status: Abnormal   Collection Time   07/22/11  7:34 AM      Component Value Range Comment   Glucose-Capillary 59 (*) 70 - 99 (mg/dL)    Comment 1 Notify RN      Comment 2 Documented in Chart      Comment 3 Call MD NNP PA CNM     GLUCOSE, CAPILLARY     Status: Abnormal   Collection Time   07/22/11  8:41 AM      Component Value  Range Comment   Glucose-Capillary 156 (*) 70 - 99 (mg/dL)    Comment 1 Documented in Chart      Comment 2 Notify RN     GLUCOSE, CAPILLARY     Status: Normal   Collection Time   07/22/11  1:12 PM      Component Value Range Comment   Glucose-Capillary 89  70 - 99 (mg/dL)   CBC     Status: Abnormal   Collection Time   07/22/11  1:31 PM      Component Value Range Comment   WBC 7.3  4.0 - 10.5 (  K/uL)    RBC 4.06  3.87 - 5.11 (MIL/uL)    Hemoglobin 10.9 (*) 12.0 - 15.0 (g/dL)    HCT 16.1 (*) 09.6 - 46.0 (%)    MCV 85.0  78.0 - 100.0 (fL)    MCH 26.8  26.0 - 34.0 (pg)    MCHC 31.6  30.0 - 36.0 (g/dL)    RDW 04.5 (*) 40.9 - 15.5 (%)    Platelets 165  150 - 400 (K/uL)   DIFFERENTIAL     Status: Abnormal   Collection Time   07/22/11  1:31 PM      Component Value Range Comment   Neutrophils Relative 55  43 - 77 (%)    Lymphocytes Relative 30  12 - 46 (%)    Monocytes Relative 13 (*) 3 - 12 (%)    Eosinophils Relative 2  0 - 5 (%)    Basophils Relative 0  0 - 1 (%)    Neutro Abs 4.1  1.7 - 7.7 (K/uL)    Lymphs Abs 2.2  0.7 - 4.0 (K/uL)    Monocytes Absolute 0.9  0.1 - 1.0 (K/uL)    Eosinophils Absolute 0.1  0.0 - 0.7 (K/uL)    Basophils Absolute 0.0  0.0 - 0.1 (K/uL)    RBC Morphology POLYCHROMASIA PRESENT   SPHEROCYTES   Smear Review PLATELETS APPEAR ADEQUATE   LARGE PLATELETS PRESENT  HEMOGLOBIN A1C     Status: Normal   Collection Time   07/22/11  1:38 PM      Component Value Range Comment   Hemoglobin A1C 4.9  <5.7 (%)    Mean Plasma Glucose 94  <117 (mg/dL)   RENAL FUNCTION PANEL     Status: Abnormal   Collection Time   07/22/11  1:38 PM      Component Value Range Comment   Sodium 136  135 - 145 (mEq/L)    Potassium 4.1  3.5 - 5.1 (mEq/L)    Chloride 93 (*) 96 - 112 (mEq/L)    CO2 25  19 - 32 (mEq/L)    Glucose, Bld 130 (*) 70 - 99 (mg/dL)    BUN 56 (*) 6 - 23 (mg/dL)    Creatinine, Ser 8.11 (*) 0.50 - 1.10 (mg/dL)    Calcium 9.0  8.4 - 10.5 (mg/dL)    Phosphorus 3.5  2.3 - 4.6  (mg/dL)    Albumin 3.0 (*) 3.5 - 5.2 (g/dL)    GFR calc non Af Amer 4 (*) >90 (mL/min)    GFR calc Af Amer 5 (*) >90 (mL/min)    No results found.    Assessment: Present on Admission:  .Hyperparathyroidism, secondary renal .ESRD (end stage renal disease) on dialysis .Hyperlipidemia .Anemia of chronic disease .Diabetes mellitus .HTN (hypertension), benign   Plan:    Observation MED/SURG Dialysis  Continue Regular Meds DVT prophylaxis Other plans as per orders.    CODE STATUS:      FULL CODE       Eusevio Schriver C 07/23/2011, 9:15 PM

## 2011-07-23 NOTE — Consult Note (Signed)
Pt. Is in the ER dialysis orders written .

## 2011-07-23 NOTE — ED Notes (Signed)
Pt here for dia;ysis, last had on Thursday-yesterday.

## 2011-07-23 NOTE — ED Provider Notes (Signed)
History     CSN: [id number]  Arrival date & time 07/23/11  1924   None     Chief Complaint  Patient presents with  . Chronic Kidney Disease    HPI Pt was seen at 1945.  Per pt, c/o gradual onset and persistence of constant ESRD on HD.  States she is here for her HD treatment.  Pt has no complaints other than she is hungry.     Past Medical History  Diagnosis Date  . Chronic kidney disease     esrd  . Diabetes mellitus   . Hyperlipidemia   . Hypertension   . Renal disorder   . Dialysis care     tues, thurs, sat  . 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 Carotid endarterectomy 2002    left  . Btl   . Rectal fissurectomy   . Av fistula placement 10/30/2010    right brachiocephalic   . Fistulogram     Family History  Problem Relation Age of Onset  . COPD Mother   . Kidney disease Mother   . Heart disease Father   . Lymphoma Son     History  Substance Use Topics  . Smoking status: Current Everyday Smoker -- 1.0 packs/day for 50 years    Types: Cigarettes  . Smokeless tobacco: Never Used  . Alcohol Use: No    Review of Systems ROS: Statement: All systems negative except as marked or noted in the HPI; Constitutional: Negative for fever and chills. ; ; Eyes: Negative for eye pain, redness and discharge. ; ; ENMT: Negative for ear pain, hoarseness, nasal congestion, sinus pressure and sore throat. ; ; Cardiovascular: Negative for chest pain, palpitations, diaphoresis, dyspnea and peripheral edema. ; ; Respiratory: Negative for cough, wheezing and stridor. ; ; Gastrointestinal: Negative for nausea, vomiting, diarrhea, abdominal pain, blood in stool, hematemesis, jaundice and rectal bleeding. . ; ; Genitourinary: Negative for dysuria, flank pain and hematuria. ; ; Musculoskeletal: Negative for back pain and neck pain. Negative for swelling and trauma.; ; Skin: Negative for pruritus, rash, abrasions, blisters, bruising and skin lesion.; ;  Neuro: Negative for headache, lightheadedness and neck stiffness. Negative for weakness, altered level of consciousness , altered mental status, extremity weakness, paresthesias, involuntary movement, seizure and syncope.    Allergies  Codeine; Epinephrine; and Percocet  Home Medications   Current Outpatient Rx  Name Route Sig Dispense Refill  . ACETAMINOPHEN 500 MG PO TABS Oral Take 500 mg by mouth every 6 (six) hours as needed. For pain    . ALLOPURINOL 100 MG PO TABS Oral Take 100 mg by mouth daily.      Marland Kitchen AMLODIPINE BESYLATE 5 MG PO TABS Oral Take 2 tablets (10 mg total) by mouth at bedtime. 30 tablet 0  . CALCIUM ACETATE 667 MG PO CAPS Oral Take 1,334 mg by mouth 3 (three) times daily with meals.     Marland Kitchen CLONIDINE HCL 0.1 MG PO TABS Oral Take 0.1 mg by mouth 2 (two) times daily.    Marland Kitchen COENZYME Q10 150 MG PO CAPS Oral Take 300 mg by mouth daily.     . COLCHICINE 0.6 MG PO TABS Oral Take 0.6 mg by mouth daily as needed. For gout.    Marland Kitchen DOCUSATE SODIUM 100 MG PO CAPS Oral Take 200 mg by mouth at bedtime.      Marland Kitchen HYDRALAZINE HCL 25 MG PO TABS Oral Take 50 mg  by mouth 4 (four) times daily.    . L-METHYLFOLATE-B6-B12 3-35-2 MG PO TABS Oral Take 1 tablet by mouth 2 (two) times daily.     Marland Kitchen LOSARTAN POTASSIUM 50 MG PO TABS Oral Take 50 mg by mouth at bedtime.     Marland Kitchen METOPROLOL SUCCINATE ER 50 MG PO TB24 Oral Take 100 mg by mouth at bedtime.    Marland Kitchen RENA-VITE PO TABS Oral Take 1 tablet by mouth daily.      Marland Kitchen REPAGLINIDE 0.5 MG PO TABS Oral Take 0.5 mg by mouth 3 (three) times daily before meals.        BP 173/51  Pulse 76  Temp(Src) 97.4 F (36.3 C) (Oral)  Resp 16  SpO2 95%  Physical Exam 1955: Physical examination:  Nursing notes reviewed; Vital signs and O2 SAT reviewed;  Constitutional: Well developed, Well nourished, Well hydrated, In no acute distress; Head:  Normocephalic, atraumatic; Eyes: EOMI, PERRL, No scleral icterus; ENMT: Mouth and pharynx normal, Mucous membranes moist; Neck:  Supple, Full range of motion, No lymphadenopathy; Cardiovascular: Regular rate and rhythm, No gallop; Respiratory: Breath sounds clear & equal bilaterally, No wheezes, speaking full sentences with ease. Normal respiratory effort/excursion; Chest: Movement normal; Extremities: Pulses normal, No tenderness, No edema, No calf edema or asymmetry.; Neuro: AA&Ox3, Major CN grossly intact. Pt eating during exam.  Speech clear.  No gross focal motor or sensory deficits in extremities.; Skin: Color normal, Warm, Dry.    ED Course  Procedures   MDM  MDM Reviewed: previous chart, nursing note and vitals      8:02 PM:  Dr. Bascom Levels here at bedside.  HD ready for pt.  ED RN aware.  Will call [facility] for Obs admit.   8:16 PM:  T/C to [facility] Dr. Lovell Sheehan, case discussed, including:  HPI, pertinent PM/SHx, VS/PE, dx testing, ED course and treatment:  Agreeable to obs admit, requests to obtain medical observation bed to team 2.      Laray Anger, DO 07/24/11 1116

## 2011-07-26 ENCOUNTER — Observation Stay (HOSPITAL_COMMUNITY)
Admission: EM | Admit: 2011-07-26 | Discharge: 2011-07-27 | Disposition: A | Discharge status: Home / Self Care | Payer: Medicare HMO | Attending: Internal Medicine | Admitting: Internal Medicine

## 2011-07-26 ENCOUNTER — Encounter (HOSPITAL_COMMUNITY): Payer: Self-pay | Admitting: Emergency Medicine

## 2011-07-26 ENCOUNTER — Observation Stay (HOSPITAL_COMMUNITY): Payer: Medicare HMO

## 2011-07-26 DIAGNOSIS — I10 Essential (primary) hypertension: Secondary | ICD-10-CM

## 2011-07-26 DIAGNOSIS — N186 End stage renal disease: Secondary | ICD-10-CM

## 2011-07-26 DIAGNOSIS — Z992 Dependence on renal dialysis: Principal | ICD-10-CM | POA: Insufficient documentation

## 2011-07-26 DIAGNOSIS — E1165 Type 2 diabetes mellitus with hyperglycemia: Secondary | ICD-10-CM

## 2011-07-26 DIAGNOSIS — E785 Hyperlipidemia, unspecified: Secondary | ICD-10-CM | POA: Insufficient documentation

## 2011-07-26 DIAGNOSIS — I120 Hypertensive chronic kidney disease with stage 5 chronic kidney disease or end stage renal disease: Secondary | ICD-10-CM | POA: Insufficient documentation

## 2011-07-26 DIAGNOSIS — E119 Type 2 diabetes mellitus without complications: Secondary | ICD-10-CM | POA: Insufficient documentation

## 2011-07-26 LAB — CBC
Hemoglobin: 10.9 g/dL — ABNORMAL LOW (ref 12.0–15.0)
MCV: 82.2 fL (ref 78.0–100.0)
Platelets: 184 10*3/uL (ref 150–400)
RBC: 4.1 MIL/uL (ref 3.87–5.11)
WBC: 6.8 10*3/uL (ref 4.0–10.5)

## 2011-07-26 LAB — DIFFERENTIAL
Eosinophils Relative: 3 % (ref 0–5)
Lymphocytes Relative: 35 % (ref 12–46)
Lymphs Abs: 2.4 10*3/uL (ref 0.7–4.0)
Monocytes Relative: 11 % (ref 3–12)

## 2011-07-26 MED ORDER — HYDRALAZINE HCL 50 MG PO TABS: 50.0000 mg | ORAL_TABLET | Freq: Four times a day (QID) | ORAL | Status: DC

## 2011-07-26 MED ORDER — HEPARIN SODIUM (PORCINE) 1000 UNIT/ML DIALYSIS
1000.0000 [IU] | INTRAMUSCULAR | Status: DC | PRN
  Filled 2011-07-26: qty 1

## 2011-07-26 MED ORDER — CALCIUM ACETATE 667 MG PO CAPS
1334.0000 mg | ORAL_CAPSULE | Freq: Three times a day (TID) | ORAL | Status: DC
  Administered 2011-07-27: 1334 mg via ORAL
  Filled 2011-07-26 (×4): qty 2

## 2011-07-26 MED ORDER — L-METHYLFOLATE-B6-B12 3-35-2 MG PO TABS: 1.0000 | ORAL_TABLET | Freq: Two times a day (BID) | ORAL | Status: DC

## 2011-07-26 MED ORDER — LOSARTAN POTASSIUM 50 MG PO TABS: 50.0000 mg | ORAL_TABLET | Freq: Every day | ORAL | Status: DC

## 2011-07-26 MED ORDER — ALLOPURINOL 100 MG PO TABS: 100.0000 mg | ORAL_TABLET | Freq: Every day | ORAL | Status: DC

## 2011-07-26 MED ORDER — COLCHICINE 0.6 MG PO TABS: 0.6000 mg | ORAL_TABLET | Freq: Every day | ORAL | Status: DC | PRN

## 2011-07-26 MED ORDER — CLONIDINE HCL 0.1 MG PO TABS: 0.1000 mg | ORAL_TABLET | Freq: Two times a day (BID) | ORAL | Status: DC

## 2011-07-26 MED ORDER — AMLODIPINE BESYLATE 10 MG PO TABS: 10.0000 mg | ORAL_TABLET | Freq: Every day | ORAL | Status: DC

## 2011-07-26 MED ORDER — PARICALCITOL 5 MCG/ML IV SOLN
1.0000 ug | Freq: Once | INTRAVENOUS | Status: AC
  Administered 2011-07-27: 1 ug via INTRAVENOUS
  Filled 2011-07-26: qty 0.2

## 2011-07-26 MED ORDER — HYDRALAZINE HCL 20 MG/ML IJ SOLN: 10.0000 mg | INTRAMUSCULAR | Status: DC | PRN

## 2011-07-26 MED ORDER — METOPROLOL SUCCINATE ER 100 MG PO TB24: 100.0000 mg | ORAL_TABLET | Freq: Every day | ORAL | Status: DC

## 2011-07-26 MED ORDER — DOCUSATE SODIUM 100 MG PO CAPS: 200.0000 mg | ORAL_CAPSULE | Freq: Every day | ORAL | Status: DC

## 2011-07-26 NOTE — Consult Note (Signed)
Pt. In the ER .dialysis orders written.    Pt. In the ER .dialysis orders written.

## 2011-07-26 NOTE — ED Provider Notes (Signed)
History     CSN: [id number]  Arrival date & time 07/26/11  1947   First MD Initiated Contact with Patient 07/26/11 2100      No chief complaint on file.   (Consider location/radiation/quality/duration/timing/severity/associated sxs/prior treatment) The history is provided by the patient. No language interpreter was used.   CC: Patient here with intentions to have her hemodialysis tonight. Patient has end-stage renal disease and comes to the ER to have her dialysis done by getting admitted by the hospitalist and Dr. Leretha Dykes calls in orders. Dialysis floor called and they said they can take her in about an hour. No complaints. Past Medical History  Diagnosis Date  . Chronic kidney disease     esrd  . Diabetes mellitus   . Hyperlipidemia   . Hypertension   . Renal disorder   . Dialysis care     tues, thurs, sat  . 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 Carotid endarterectomy 2002    left  . Btl   . Rectal fissurectomy   . Av fistula placement 10/30/2010    right brachiocephalic   . Fistulogram     Family History  Problem Relation Age of Onset  . COPD Mother   . Kidney disease Mother   . Heart disease Father   . Lymphoma Son     History  Substance Use Topics  . Smoking status: Current Everyday Smoker -- 1.0 packs/day for 50 years    Types: Cigarettes  . Smokeless tobacco: Never Used  . Alcohol Use: No    OB History    Grav Para Term Preterm Abortions TAB SAB Ect Mult Living                  Review of Systems  Constitutional: Negative.   HENT: Negative.   Eyes: Negative.   Respiratory: Negative.   Cardiovascular: Negative.   Gastrointestinal: Negative.   Skin:       R subclavian cath  Neurological: Negative.   Psychiatric/Behavioral: Negative.   All other systems reviewed and are negative.    Allergies  Codeine; Epinephrine; and Percocet  Home Medications   Current Outpatient Rx  Name Route Sig Dispense  Refill  . ACETAMINOPHEN 500 MG PO TABS Oral Take 500 mg by mouth every 6 (six) hours as needed. For pain    . ALLOPURINOL 100 MG PO TABS Oral Take 100 mg by mouth daily.      Marland Kitchen AMLODIPINE BESYLATE 5 MG PO TABS Oral Take 2 tablets (10 mg total) by mouth at bedtime. 30 tablet 0  . CALCIUM ACETATE 667 MG PO CAPS Oral Take 1,334 mg by mouth 3 (three) times daily with meals.     Marland Kitchen CLONIDINE HCL 0.1 MG PO TABS Oral Take 0.1 mg by mouth 2 (two) times daily.    Marland Kitchen COENZYME Q10 150 MG PO CAPS Oral Take 300 mg by mouth daily.     Marland Kitchen DOCUSATE SODIUM 100 MG PO CAPS Oral Take 200 mg by mouth at bedtime.      Marland Kitchen HYDRALAZINE HCL 25 MG PO TABS Oral Take 50 mg by mouth 4 (four) times daily.    . L-METHYLFOLATE-B6-B12 3-35-2 MG PO TABS Oral Take 1 tablet by mouth 2 (two) times daily.     Marland Kitchen LOSARTAN POTASSIUM 50 MG PO TABS Oral Take 50 mg by mouth at bedtime.     Marland Kitchen METOPROLOL SUCCINATE ER 50 MG PO TB24 Oral  Take 100 mg by mouth at bedtime.    Marland Kitchen RENA-VITE PO TABS Oral Take 1 tablet by mouth daily.      Marland Kitchen REPAGLINIDE 0.5 MG PO TABS Oral Take 0.5 mg by mouth 3 (three) times daily before meals.      . COLCHICINE 0.6 MG PO TABS Oral Take 0.6 mg by mouth daily as needed. For gout.      BP 161/64  Pulse 66  Temp(Src) 98.1 F (36.7 C) (Oral)  Resp 18  SpO2 97%  Physical Exam  Nursing note and vitals reviewed. Constitutional: She is oriented to person, place, and time. She appears well-developed and well-nourished.  HENT:  Head: Normocephalic and atraumatic.  Eyes: Conjunctivae and EOM are normal. Pupils are equal, round, and reactive to light.  Neck: Normal range of motion. Neck supple.  Cardiovascular: Normal rate.   Pulmonary/Chest: Effort normal.       R subclavian cath in tact  Abdominal: Soft.  Musculoskeletal: Normal range of motion. She exhibits no edema and no tenderness.  Neurological: She is alert and oriented to person, place, and time. She has normal reflexes.  Skin: Skin is warm and dry.    Psychiatric: She has a normal mood and affect.    ED Course  Procedures (including critical care time)  Labs Reviewed - No data to display No results found.   1. Dialysis care       MDM  Will be admitted by Dr Brien Few hospitalists team 5 for Dialysis.  Dr. Bascom Levels at bedside and will write dialysis orders.  No complaints.        Remi Haggard, NP 07/27/11 [phone]

## 2011-07-26 NOTE — ED Notes (Signed)
PT. IS HERE FOR HER ROUTINE HEMODIALYSIS.

## 2011-07-26 NOTE — H&P (Signed)
Alexandra [name] is an 66 y.o. female.   Nephrolgist - Dr.Frazier. Chief Complaint: Has come for dialysis. HPI: 66 year-old female with known history of ESRD on hemodialysis has come for regular dialysis. Patient otherwise has no specific complaints denies any chest pain shortness breath nausea vomiting or abdominal pain. Dr. Bascom Levels will be arranging for dialysis and hospitalist will be admitting.  Past Medical History  Diagnosis Date  . Chronic kidney disease     esrd  . Diabetes mellitus   . Hyperlipidemia   . Hypertension   . Renal disorder   . Dialysis care     tues, thurs, sat  . 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 Carotid endarterectomy 2002    left  . Btl   . Rectal fissurectomy   . Av fistula placement 10/30/2010    right brachiocephalic   . Fistulogram     Family History  Problem Relation Age of Onset  . COPD Mother   . Kidney disease Mother   . Heart disease Father   . Lymphoma Son    Social History:  reports that she has been smoking Cigarettes.  She has a 50 pack-year smoking history. She has never used smokeless tobacco. She reports that she does not drink alcohol or use illicit drugs.  Allergies:  Allergies  Allergen Reactions  . Codeine Other (See Comments)    Passes out  . Epinephrine Other (See Comments)    Tachycardia, diaphoresis, syncope  . Percocet (Oxycodone-Acetaminophen) Other (See Comments)    Passes  out     (Not in a hospital admission)  No results found for this or any previous visit (from the past 48 hour(s)). No results found.  Review of Systems  Constitutional: Negative.   HENT: Negative.   Eyes: Negative.   Respiratory: Negative.   Cardiovascular: Negative.   Gastrointestinal: Negative.   Genitourinary: Negative.   Musculoskeletal: Negative.   Skin: Negative.   Neurological: Negative.   Endo/Heme/Allergies: Negative.   Psychiatric/Behavioral: Negative.     Blood pressure  161/64, pulse 66, temperature 98.1 F (36.7 C), temperature source Oral, resp. rate 18, SpO2 97.00%. Physical Exam  Constitutional: She is oriented to person, place, and time. She appears well-developed and well-nourished. No distress.  HENT:  Head: Normocephalic and atraumatic.  Right Ear: External ear normal.  Left Ear: External ear normal.  Nose: Nose normal.  Mouth/Throat: Oropharynx is clear and moist. No oropharyngeal exudate.  Eyes: Conjunctivae are normal. Pupils are equal, round, and reactive to light. Right eye exhibits no discharge. Left eye exhibits no discharge. No scleral icterus.  Neck: Normal range of motion. Neck supple.  Cardiovascular: Normal rate and regular rhythm.   Respiratory: Effort normal and breath sounds normal. No respiratory distress. She has no wheezes. She has no rales.  GI: Soft. Bowel sounds are normal. She exhibits no distension. There is no tenderness. There is no rebound.  Musculoskeletal: Normal range of motion. She exhibits no edema and no tenderness.  Neurological: She is alert and oriented to person, place, and time.       Moves all extremities.  Skin: Skin is warm and dry. No rash noted. She is not diaphoretic. No erythema.  Psychiatric: Her behavior is normal.     Assessment/Plan #1. ESRD on hemodialysis - dialysis per nephrologist. #2. Hypertension - continue home medications. #3. Diabetes mellitus type 2 - continue home medications.  CODE STATUS - full code.  Midge Minium  N. 07/26/2011, 9:59 PM

## 2011-07-27 LAB — RENAL FUNCTION PANEL
BUN: 72 mg/dL — ABNORMAL HIGH (ref 6–23)
CO2: 26 mEq/L (ref 19–32)
Calcium: 9.6 mg/dL (ref 8.4–10.5)
GFR calc Af Amer: 4 mL/min — ABNORMAL LOW (ref >90)
Glucose, Bld: 181 mg/dL — ABNORMAL HIGH (ref 70–99)
Potassium: 4.3 mEq/L (ref 3.5–5.1)
Sodium: 137 mEq/L (ref 135–145)

## 2011-07-27 MED ORDER — METOPROLOL SUCCINATE ER 50 MG PO TB24
50.0000 mg | ORAL_TABLET | Freq: Once | ORAL | Status: DC
  Filled 2011-07-27: qty 1

## 2011-07-27 MED ORDER — AMLODIPINE BESYLATE 10 MG PO TABS
10.0000 mg | ORAL_TABLET | Freq: Once | ORAL | Status: DC
  Filled 2011-07-27: qty 1

## 2011-07-27 MED ORDER — LOSARTAN POTASSIUM 50 MG PO TABS
50.0000 mg | ORAL_TABLET | Freq: Once | ORAL | Status: DC
  Filled 2011-07-27: qty 1

## 2011-07-27 MED ORDER — PARICALCITOL 5 MCG/ML IV SOLN
INTRAVENOUS | Status: AC
  Administered 2011-07-27: 1 ug via INTRAVENOUS
  Filled 2011-07-27: qty 1

## 2011-07-27 NOTE — ED Provider Notes (Signed)
Medical screening examination/treatment/procedure(s) were performed by non-physician practitioner and as supervising physician I was immediately available for consultation/collaboration.  Flint Melter, MD 07/27/11 [phone]

## 2011-07-28 ENCOUNTER — Other Ambulatory Visit: Payer: Self-pay | Admitting: Nephrology

## 2011-07-28 ENCOUNTER — Observation Stay (HOSPITAL_COMMUNITY)
Admission: EM | Admit: 2011-07-28 | Discharge: 2011-07-29 | Disposition: A | Discharge status: Home / Self Care | Payer: Medicare HMO | Attending: Emergency Medicine | Admitting: Emergency Medicine

## 2011-07-28 ENCOUNTER — Encounter (HOSPITAL_COMMUNITY): Payer: Self-pay | Admitting: *Deleted

## 2011-07-28 DIAGNOSIS — D638 Anemia in other chronic diseases classified elsewhere: Secondary | ICD-10-CM | POA: Diagnosis present

## 2011-07-28 DIAGNOSIS — I10 Essential (primary) hypertension: Secondary | ICD-10-CM | POA: Diagnosis present

## 2011-07-28 DIAGNOSIS — I120 Hypertensive chronic kidney disease with stage 5 chronic kidney disease or end stage renal disease: Secondary | ICD-10-CM | POA: Insufficient documentation

## 2011-07-28 DIAGNOSIS — N186 End stage renal disease: Secondary | ICD-10-CM | POA: Insufficient documentation

## 2011-07-28 DIAGNOSIS — Z992 Dependence on renal dialysis: Secondary | ICD-10-CM | POA: Insufficient documentation

## 2011-07-28 DIAGNOSIS — E119 Type 2 diabetes mellitus without complications: Secondary | ICD-10-CM | POA: Insufficient documentation

## 2011-07-28 DIAGNOSIS — E785 Hyperlipidemia, unspecified: Secondary | ICD-10-CM | POA: Insufficient documentation

## 2011-07-28 NOTE — ED Notes (Signed)
Pt here for her MWF dialysis apt. No complaints.

## 2011-07-28 NOTE — ED Notes (Signed)
Received pt. From triage, pt. Alert and oriented, gait steady, NAD noted, pt. Here for dialysis

## 2011-07-29 ENCOUNTER — Encounter (HOSPITAL_COMMUNITY): Payer: Self-pay | Admitting: Internal Medicine

## 2011-07-29 ENCOUNTER — Observation Stay (HOSPITAL_COMMUNITY): Payer: Medicare HMO

## 2011-07-29 DIAGNOSIS — N186 End stage renal disease: Secondary | ICD-10-CM

## 2011-07-29 DIAGNOSIS — E1165 Type 2 diabetes mellitus with hyperglycemia: Secondary | ICD-10-CM

## 2011-07-29 DIAGNOSIS — I10 Essential (primary) hypertension: Secondary | ICD-10-CM

## 2011-07-29 LAB — CBC
Hemoglobin: 10.7 g/dL — ABNORMAL LOW (ref 12.0–15.0)
MCH: 25.7 pg — ABNORMAL LOW (ref 26.0–34.0)
MCV: 82 fL (ref 78.0–100.0)
Platelets: 185 10*3/uL (ref 150–400)
RBC: 4.16 MIL/uL (ref 3.87–5.11)

## 2011-07-29 LAB — RENAL FUNCTION PANEL
Albumin: 3.1 g/dL — ABNORMAL LOW (ref 3.5–5.2)
BUN: 64 mg/dL — ABNORMAL HIGH (ref 6–23)
Chloride: 98 mEq/L (ref 96–112)
Creatinine, Ser: 8.89 mg/dL — ABNORMAL HIGH (ref 0.50–1.10)

## 2011-07-29 LAB — DIFFERENTIAL
Eosinophils Absolute: 0.2 10*3/uL (ref 0.0–0.7)
Eosinophils Relative: 4 % (ref 0–5)
Lymphs Abs: 2.5 10*3/uL (ref 0.7–4.0)
Monocytes Relative: 13 % — ABNORMAL HIGH (ref 3–12)

## 2011-07-29 LAB — GLUCOSE, CAPILLARY: Glucose-Capillary: 133 mg/dL — ABNORMAL HIGH (ref 70–99)

## 2011-07-29 MED ORDER — PARICALCITOL 5 MCG/ML IV SOLN
1.0000 ug | Freq: Once | INTRAVENOUS | Status: AC
  Administered 2011-07-29: 1 ug via INTRAVENOUS

## 2011-07-29 MED ORDER — DARBEPOETIN ALFA-POLYSORBATE 100 MCG/0.5ML IJ SOLN
100.0000 ug | Freq: Once | INTRAMUSCULAR | Status: AC
  Administered 2011-07-29: 100 ug via INTRAVENOUS
  Filled 2011-07-29: qty 0.5

## 2011-07-29 MED ORDER — DARBEPOETIN ALFA-POLYSORBATE 100 MCG/0.5ML IJ SOLN
INTRAMUSCULAR | Status: AC
  Administered 2011-07-29: 100 ug via INTRAVENOUS
  Filled 2011-07-29: qty 0.5

## 2011-07-29 MED ORDER — CLONIDINE HCL 0.1 MG PO TABS
0.1000 mg | ORAL_TABLET | Freq: Two times a day (BID) | ORAL | Status: DC
  Administered 2011-07-29: 0.1 mg via ORAL
  Filled 2011-07-29: qty 1

## 2011-07-29 MED ORDER — PARICALCITOL 5 MCG/ML IV SOLN
INTRAVENOUS | Status: AC
  Administered 2011-07-29: 1 ug via INTRAVENOUS
  Filled 2011-07-29: qty 1

## 2011-07-29 NOTE — ED Notes (Signed)
Patient refused to confirm name and DOB prior to CBG monitoring.  RN Theodoro Grist informed.

## 2011-07-29 NOTE — ED Notes (Signed)
Pt. Transported via stretcher to dialysis, NAD notd

## 2011-07-29 NOTE — Progress Notes (Signed)
Pt became verbally abusive when told that she was unable to get a full meal tray while on dialysis per unit policy. She stated that she always gets a tray and that she wanted her "damn tray" and she couldn't eat that applesauce they bring. She then called [name] the VP of nursing to see if she could in fact get a meal tray in dialysis. Ms Kennedy Bucker was not in the office but she spoke with Marcelino Duster regarding the situation and Marcelino Duster proceeded to call me to discuss this as well. Also spoke to Roosevelt Locks, Associate Professor who stated that Ms Norva Riffle was already told about this policy and that we were not to deviate from it. I relayed this information once again with Ms Offutt who then stated that she just needed something to eat at this point. Snack tray ordered.

## 2011-07-29 NOTE — ED Provider Notes (Signed)
History     CSN: [id number]  Arrival date & time 07/28/11  2226   First MD Initiated Contact with Patient 07/29/11 0116      Chief Complaint  Patient presents with  . Vascular Access Problem    (Consider location/radiation/quality/duration/timing/severity/associated sxs/prior treatment) HPI 66 yo female presents to the ER for her dialysis.  Pt is without complaints, denies sob, chest pain, leg swelling, palpitations.  She reports she never has symptoms of fluid overload.  Last dialysis was Monday.    Past Medical History  Diagnosis Date  . Chronic kidney disease     esrd  . Diabetes mellitus   . Hyperlipidemia   . Hypertension   . Renal disorder   . Dialysis care     tues, thurs, sat  . 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 Carotid endarterectomy 2002    left  . Btl   . Rectal fissurectomy   . Av fistula placement 10/30/2010    right brachiocephalic   . Fistulogram     Family History  Problem Relation Age of Onset  . COPD Mother   . Kidney disease Mother   . Heart disease Father   . Lymphoma Son     History  Substance Use Topics  . Smoking status: Current Everyday Smoker -- 1.0 packs/day for 50 years    Types: Cigarettes  . Smokeless tobacco: Never Used  . Alcohol Use: No    OB History    Grav Para Term Preterm Abortions TAB SAB Ect Mult Living                  Review of Systems  All other systems reviewed and are negative.    Allergies  Codeine; Epinephrine; and Percocet  Home Medications   Current Outpatient Rx  Name Route Sig Dispense Refill  . ALLOPURINOL 100 MG PO TABS Oral Take 100 mg by mouth daily.      Marland Kitchen AMLODIPINE BESYLATE 5 MG PO TABS Oral Take 2 tablets (10 mg total) by mouth at bedtime. 30 tablet 0  . CALCIUM ACETATE 667 MG PO CAPS Oral Take 1,334 mg by mouth 3 (three) times daily with meals.     Marland Kitchen CLONIDINE HCL 0.1 MG PO TABS Oral Take 0.1 mg by mouth 2 (two) times daily.    Marland Kitchen COENZYME Q10  150 MG PO CAPS Oral Take 300 mg by mouth daily.     . COLCHICINE 0.6 MG PO TABS Oral Take 0.6 mg by mouth daily as needed. For gout.    Marland Kitchen DOCUSATE SODIUM 100 MG PO CAPS Oral Take 200 mg by mouth at bedtime.      Marland Kitchen HYDRALAZINE HCL 25 MG PO TABS Oral Take 50 mg by mouth 4 (four) times daily.    . L-METHYLFOLATE-B6-B12 3-35-2 MG PO TABS Oral Take 1 tablet by mouth 2 (two) times daily.     Marland Kitchen LOSARTAN POTASSIUM 50 MG PO TABS Oral Take 50 mg by mouth at bedtime.     Marland Kitchen METOPROLOL SUCCINATE ER 50 MG PO TB24 Oral Take 100 mg by mouth at bedtime.    Marland Kitchen RENA-VITE PO TABS Oral Take 1 tablet by mouth daily.      Marland Kitchen REPAGLINIDE 0.5 MG PO TABS Oral Take 0.5 mg by mouth 3 (three) times daily before meals.        BP 160/112  Pulse 80  Temp(Src) 99 F (37.2 C) (Oral)  Resp  18  SpO2 97%  Physical Exam  Nursing note and vitals reviewed. Constitutional: She appears well-developed and well-nourished. No distress.       Hypertension noted  HENT:  Head: Normocephalic and atraumatic.  Right Ear: External ear normal.  Left Ear: External ear normal.  Neck: Normal range of motion. Neck supple. No JVD present. No tracheal deviation present. No thyromegaly present.  Cardiovascular: Normal rate, regular rhythm, normal heart sounds and intact distal pulses.  Exam reveals no gallop and no friction rub.   No murmur heard. Pulmonary/Chest: Effort normal and breath sounds normal. No stridor. No respiratory distress. She has no wheezes. She has no rales. She exhibits no tenderness.  Abdominal: Soft. Bowel sounds are normal. She exhibits no distension and no mass. There is no tenderness. There is no rebound and no guarding.  Musculoskeletal: Normal range of motion. She exhibits no edema and no tenderness.  Lymphadenopathy:    She has no cervical adenopathy.  Skin: Skin is warm and dry. No rash noted. She is not diaphoretic. No erythema. No pallor.  Psychiatric: Her behavior is normal. Judgment and thought content normal.     ED Course  Procedures (including critical care time)  Labs Reviewed  GLUCOSE, CAPILLARY - Abnormal; Notable for the following:    Glucose-Capillary 133 (*)    All other components within normal limits   No results found.   1. Hypertensive CKD, ESRD on dialysis       MDM  To be admitted for dialysis.        Olivia Mackie, MD 07/29/11 0200

## 2011-07-29 NOTE — H&P (Signed)
Alexandra [name] is an 66 y.o. female.   Nephrologist - Dr.Frazier. Chief Complaint: Has come for dialysis. HPI: 66 year-old female with known history of ESRD on hemodialysis has come for her regular dialysis. Patient has no specific complaints. Specifically denies any shortness of breath, chest pain, nausea vomiting or abdominal pain. Patient will be admitted for her regular dialysis.  Past Medical History  Diagnosis Date  . Chronic kidney disease     esrd  . Diabetes mellitus   . Hyperlipidemia   . Hypertension   . Renal disorder   . Dialysis care     tues, thurs, sat  . 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 Carotid endarterectomy 2002    left  . Btl   . Rectal fissurectomy   . Av fistula placement 10/30/2010    right brachiocephalic   . Fistulogram     Family History  Problem Relation Age of Onset  . COPD Mother   . Kidney disease Mother   . Heart disease Father   . Lymphoma Son    Social History:  reports that she has been smoking Cigarettes.  She has a 50 pack-year smoking history. She has never used smokeless tobacco. She reports that she does not drink alcohol or use illicit drugs.  Allergies:  Allergies  Allergen Reactions  . Codeine Other (See Comments)    Passes out  . Epinephrine Other (See Comments)    Tachycardia, diaphoresis, syncope  . Percocet (Oxycodone-Acetaminophen) Other (See Comments)    Passes  out     (Not in a hospital admission)  Results for orders placed during the hospital encounter of 07/28/11 (from the past 48 hour(s))  GLUCOSE, CAPILLARY     Status: Abnormal   Collection Time   07/29/11  1:27 AM      Component Value Range Comment   Glucose-Capillary 133 (*) 70 - 99 (mg/dL)    No results found.  Review of Systems  Constitutional: Negative.   HENT: Negative.   Eyes: Negative.   Respiratory: Negative.   Cardiovascular: Negative.   Gastrointestinal: Negative.   Genitourinary: Negative.    Musculoskeletal: Negative.   Skin: Negative.   Neurological: Negative.   Endo/Heme/Allergies: Negative.   Psychiatric/Behavioral: Negative.     Blood pressure 160/112, pulse 80, temperature 99 F (37.2 C), temperature source Oral, resp. rate 18, SpO2 97.00%. Physical Exam  Constitutional: She is oriented to person, place, and time. She appears well-developed and well-nourished. No distress.  HENT:  Head: Normocephalic and atraumatic.  Right Ear: External ear normal.  Left Ear: External ear normal.  Nose: Nose normal.  Mouth/Throat: Oropharynx is clear and moist. No oropharyngeal exudate.  Eyes: Conjunctivae are normal. Pupils are equal, round, and reactive to light. Right eye exhibits no discharge. Left eye exhibits no discharge. No scleral icterus.  Neck: Normal range of motion. Neck supple.  Cardiovascular: Normal rate and regular rhythm.   Respiratory: Effort normal and breath sounds normal. No respiratory distress. She has no wheezes. She has no rales.  GI: Soft. Bowel sounds are normal. She exhibits no distension. There is no tenderness. There is no rebound.  Musculoskeletal: Normal range of motion. She exhibits no edema and no tenderness.  Neurological: She is alert and oriented to person, place, and time.       Moves all extremities.  Skin: Skin is warm and dry. She is not diaphoretic.  Psychiatric: Her behavior is normal.  Assessment/Plan #1. ESRD on hemodialysis - dialysis per Dr. Bascom Levels. #2. Hypertension - continue home medications. #3. Diabetes mellitus2 - continue medications. Check CBG a.c. and at bedtime.  CODE STATUS - full code.  Nacole Fluhr N. 07/29/2011, 2:56 AM

## 2011-07-30 ENCOUNTER — Encounter (HOSPITAL_COMMUNITY): Payer: Self-pay | Admitting: *Deleted

## 2011-07-30 ENCOUNTER — Observation Stay (HOSPITAL_COMMUNITY)
Admission: EM | Admit: 2011-07-30 | Discharge: 2011-07-31 | Disposition: A | Discharge status: Home / Self Care | Payer: Medicare HMO | Attending: Internal Medicine | Admitting: Internal Medicine

## 2011-07-30 DIAGNOSIS — I120 Hypertensive chronic kidney disease with stage 5 chronic kidney disease or end stage renal disease: Secondary | ICD-10-CM | POA: Insufficient documentation

## 2011-07-30 DIAGNOSIS — N19 Unspecified kidney failure: Secondary | ICD-10-CM

## 2011-07-30 DIAGNOSIS — Z992 Dependence on renal dialysis: Principal | ICD-10-CM | POA: Insufficient documentation

## 2011-07-30 DIAGNOSIS — D638 Anemia in other chronic diseases classified elsewhere: Secondary | ICD-10-CM | POA: Diagnosis present

## 2011-07-30 DIAGNOSIS — I10 Essential (primary) hypertension: Secondary | ICD-10-CM

## 2011-07-30 DIAGNOSIS — N186 End stage renal disease: Secondary | ICD-10-CM

## 2011-07-30 DIAGNOSIS — E1165 Type 2 diabetes mellitus with hyperglycemia: Secondary | ICD-10-CM

## 2011-07-30 DIAGNOSIS — E119 Type 2 diabetes mellitus without complications: Secondary | ICD-10-CM | POA: Insufficient documentation

## 2011-07-30 NOTE — ED Notes (Signed)
Pt here for routine hemodialysis.

## 2011-07-30 NOTE — ED Provider Notes (Signed)
History     CSN: [id number]  Arrival date & time 07/30/11  2255   First MD Initiated Contact with Patient 07/30/11 2329      Chief Complaint  Patient presents with  . Vascular Access Problem    routine dialysis     Patient is a 66 y.o. female presenting with hypertension. The history is provided by the patient.  Hypertension This is a chronic problem. The current episode started more than 1 week ago. The problem occurs constantly. The problem has not changed since onset.Pertinent negatives include no shortness of breath. The symptoms are relieved by nothing. She has tried nothing for the symptoms.    Past Medical History  Diagnosis Date  . Chronic kidney disease     esrd  . Diabetes mellitus   . Hyperlipidemia   . Hypertension   . Renal disorder   . Dialysis care     tues, thurs, sat  . 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 Carotid endarterectomy 2002    left  . Btl   . Rectal fissurectomy   . Av fistula placement 10/30/2010    right brachiocephalic   . Fistulogram     Family History  Problem Relation Age of Onset  . COPD Mother   . Kidney disease Mother   . Heart disease Father   . Lymphoma Son     History  Substance Use Topics  . Smoking status: Current Everyday Smoker -- 1.0 packs/day for 50 years    Types: Cigarettes  . Smokeless tobacco: Never Used  . Alcohol Use: No    OB History    Grav Para Term Preterm Abortions TAB SAB Ect Mult Living                  Review of Systems  Constitutional: Negative for fever.  Respiratory: Negative for shortness of breath.     Allergies  Codeine; Epinephrine; and Percocet  Home Medications   Current Outpatient Rx  Name Route Sig Dispense Refill  . ALLOPURINOL 100 MG PO TABS Oral Take 100 mg by mouth daily.      Marland Kitchen AMLODIPINE BESYLATE 5 MG PO TABS Oral Take 2 tablets (10 mg total) by mouth at bedtime. 30 tablet 0  . CALCIUM ACETATE 667 MG PO CAPS Oral Take 1,334 mg  by mouth 3 (three) times daily with meals.     Marland Kitchen CLONIDINE HCL 0.1 MG PO TABS Oral Take 0.1 mg by mouth 2 (two) times daily.    Marland Kitchen COENZYME Q10 150 MG PO CAPS Oral Take 300 mg by mouth daily.     . COLCHICINE 0.6 MG PO TABS Oral Take 0.6 mg by mouth daily as needed. For gout.    Marland Kitchen DOCUSATE SODIUM 100 MG PO CAPS Oral Take 200 mg by mouth at bedtime.      Marland Kitchen HYDRALAZINE HCL 25 MG PO TABS Oral Take 50 mg by mouth 4 (four) times daily.    . L-METHYLFOLATE-B6-B12 3-35-2 MG PO TABS Oral Take 1 tablet by mouth 2 (two) times daily.     Marland Kitchen LOSARTAN POTASSIUM 50 MG PO TABS Oral Take 50 mg by mouth at bedtime.     Marland Kitchen METOPROLOL SUCCINATE ER 50 MG PO TB24 Oral Take 100 mg by mouth at bedtime.    Marland Kitchen RENA-VITE PO TABS Oral Take 1 tablet by mouth daily.      Marland Kitchen REPAGLINIDE 0.5 MG PO TABS Oral Take  0.5 mg by mouth 3 (three) times daily before meals.        BP 166/52  Pulse 75  Temp(Src) 98.8 F (37.1 C) (Oral)  Resp 16  SpO2 97%  Physical Exam CONSTITUTIONAL: Well developed/well nourished HEAD AND FACE: Normocephalic/atraumatic EYES: EOMI ENMT: Mucous membranes moist NECK: supple no meningeal signs LUNGS: no apparent distress ABDOMEN: soft,  NEURO: Pt is awake/alert, moves all extremitiesx4 EXTREMITIES:  full ROM SKIN: warm,  PSYCH: no abnormalities of mood noted  ED Course  Procedures  11:42 PM Pt here for dialysis, no distress, walking around room, eating a meal I have paged [facility] for evaluation    MDM  Nursing notes reviewed and considered in documentation Previous records reviewed and considered         Joya Gaskins, MD 07/30/11 2343

## 2011-07-30 NOTE — ED Notes (Signed)
Patient states she is here for her dialysis.  Ambulating in the room

## 2011-07-31 ENCOUNTER — Encounter (HOSPITAL_COMMUNITY): Payer: Self-pay | Admitting: Internal Medicine

## 2011-07-31 ENCOUNTER — Observation Stay (HOSPITAL_COMMUNITY): Payer: Medicare HMO

## 2011-07-31 LAB — DIFFERENTIAL
Basophils Absolute: 0.1 10*3/uL (ref 0.0–0.1)
Eosinophils Relative: 3 % (ref 0–5)
Lymphocytes Relative: 39 % (ref 12–46)
Monocytes Relative: 14 % — ABNORMAL HIGH (ref 3–12)
Neutrophils Relative %: 43 % (ref 43–77)

## 2011-07-31 LAB — RENAL FUNCTION PANEL
Albumin: 3.2 g/dL — ABNORMAL LOW (ref 3.5–5.2)
CO2: 27 mEq/L (ref 19–32)
Chloride: 99 mEq/L (ref 96–112)
Creatinine, Ser: 7.91 mg/dL — ABNORMAL HIGH (ref 0.50–1.10)
GFR calc Af Amer: 5 mL/min — ABNORMAL LOW (ref >90)
GFR calc non Af Amer: 5 mL/min — ABNORMAL LOW (ref >90)
Potassium: 4.1 mEq/L (ref 3.5–5.1)
Sodium: 139 mEq/L (ref 135–145)

## 2011-07-31 LAB — CBC
Platelets: 159 10*3/uL (ref 150–400)
RBC: 4.13 MIL/uL (ref 3.87–5.11)
RDW: 19.1 % — ABNORMAL HIGH (ref 11.5–15.5)
WBC: 6.5 10*3/uL (ref 4.0–10.5)

## 2011-07-31 LAB — GLUCOSE, CAPILLARY: Glucose-Capillary: 87 mg/dL (ref 70–99)

## 2011-07-31 LAB — HEPATITIS B SURFACE ANTIGEN: Hepatitis B Surface Ag: NEGATIVE

## 2011-07-31 MED ORDER — HEPARIN SODIUM (PORCINE) 1000 UNIT/ML DIALYSIS
20.0000 [IU]/kg | INTRAMUSCULAR | Status: DC | PRN
  Administered 2011-07-31: 1200 [IU] via INTRAVENOUS_CENTRAL

## 2011-07-31 MED ORDER — DARBEPOETIN ALFA-POLYSORBATE 100 MCG/0.5ML IJ SOLN
INTRAMUSCULAR | Status: AC
  Filled 2011-07-31: qty 0.5

## 2011-07-31 MED ORDER — HEPARIN SODIUM (PORCINE) 1000 UNIT/ML DIALYSIS: 20.0000 [IU]/kg | INTRAMUSCULAR | Status: DC | PRN

## 2011-07-31 MED ORDER — INSULIN ASPART 100 UNIT/ML ~~LOC~~ SOLN: 0.0000 [IU] | Freq: Three times a day (TID) | SUBCUTANEOUS | Status: DC

## 2011-07-31 MED ORDER — PARICALCITOL 5 MCG/ML IV SOLN
INTRAVENOUS | Status: AC
  Administered 2011-07-31: 1 ug
  Filled 2011-07-31: qty 1

## 2011-07-31 MED ORDER — CLONIDINE HCL 0.1 MG PO TABS
0.1000 mg | ORAL_TABLET | Freq: Two times a day (BID) | ORAL | Status: DC
  Administered 2011-07-31: 0.1 mg via ORAL
  Filled 2011-07-31 (×2): qty 1

## 2011-07-31 MED ORDER — PARICALCITOL 5 MCG/ML IV SOLN: 1.0000 ug | Freq: Once | INTRAVENOUS | Status: DC

## 2011-07-31 MED ORDER — BISACODYL 5 MG PO TBEC
5.0000 mg | DELAYED_RELEASE_TABLET | Freq: Every day | ORAL | Status: DC | PRN
  Administered 2011-07-31: 5 mg via ORAL
  Filled 2011-07-31: qty 1

## 2011-07-31 MED ORDER — DARBEPOETIN ALFA-POLYSORBATE 100 MCG/0.5ML IJ SOLN: 100.0000 ug | Freq: Once | INTRAMUSCULAR | Status: DC

## 2011-07-31 NOTE — Progress Notes (Signed)
Hemodialysis-See flowsheet Total UF 0.6 Liters. Pt left at 57.6 KG, under EDW of 58.5kg. Pt discharged to ED via wheelchair at 1130.

## 2011-07-31 NOTE — ED Notes (Signed)
Called dialysis center to see when Alexandra [name] was going to have her dialysis.  [name] stated he was going to go dialysize another patient and would not be done until 6am then the first shift staff would be here and they would prioritize when she would be done.  Patient stated that 2 Thursdays ago she spoke with [name] "your new director" and the plan was she would stay in the ED until she could go to the dialysis center.  "We are not going to change our plan now" per the patient.

## 2011-07-31 NOTE — ED Notes (Signed)
Dialysis called and will be ready for patient in approx 10 mins,

## 2011-07-31 NOTE — H&P (Signed)
PCP:   Alexandra Floro, MD, MD   Chief Complaint: Has come for dialysis. HPI: 66 year-old female with known history of ESRD on hemodialysis has come for her regular dialysis. Patient has no specific complaints. Specifically denies any shortness of breath, chest pain, nausea vomiting or abdominal pain. Patient will be admitted for her regular dialysis.   Rewiew of Systems:  The patient denies anorexia, fever, weight loss,, vision loss, decreased hearing, hoarseness, chest pain, syncope, dyspnea on exertion, peripheral edema, balance deficits, hemoptysis, abdominal pain, melena, hematochezia, severe indigestion/heartburn, hematuria, incontinence, genital sores, muscle weakness, suspicious skin lesions, transient blindness, difficulty walking, depression, unusual weight change, abnormal bleeding, enlarged lymph nodes, angioedema, and breast masses.    Past Medical History  Diagnosis Date  . Chronic kidney disease     esrd  . Diabetes mellitus   . Hyperlipidemia   . Hypertension   . Renal disorder   . Dialysis care     tues, thurs, sat  . 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 Carotid endarterectomy 2002    left  . Btl   . Rectal fissurectomy   . Av fistula placement 10/30/2010    right brachiocephalic   . Fistulogram     Medications:  HOME MEDS: Prior to Admission medications   Medication Sig Start Date End Date Taking? Authorizing Provider  allopurinol (ZYLOPRIM) 100 MG tablet Take 100 mg by mouth daily.     Yes Historical Provider, MD  amLODipine (NORVASC) 5 MG tablet Take 2 tablets (10 mg total) by mouth at bedtime. 07/13/11  Yes [name], MD  calcium acetate (PHOSLO) 667 MG capsule Take 1,334 mg by mouth 3 (three) times daily with meals.    Yes Historical Provider, MD  cloNIDine (CATAPRES) 0.1 MG tablet Take 0.1 mg by mouth 2 (two) times daily. 07/18/11 07/17/12 Yes [name], MD  Coenzyme Q10 150 MG CAPS Take 300 mg by mouth  daily.    Yes Historical Provider, MD  colchicine 0.6 MG tablet Take 0.6 mg by mouth daily as needed. For gout.   Yes Historical Provider, MD  docusate sodium (COLACE) 100 MG capsule Take 200 mg by mouth at bedtime.     Yes Historical Provider, MD  hydrALAZINE (APRESOLINE) 25 MG tablet Take 50 mg by mouth 4 (four) times daily. 07/18/11  Yes [name], MD  l-methylfolate-B6-B12 (METANX) 3-35-2 MG TABS Take 1 tablet by mouth 2 (two) times daily.    Yes Historical Provider, MD  losartan (COZAAR) 50 MG tablet Take 50 mg by mouth at bedtime.    Yes Historical Provider, MD  metoprolol succinate (TOPROL-XL) 50 MG 24 hr tablet Take 100 mg by mouth at bedtime. 07/18/11  Yes Antonieta Pert, MD  multivitamin (RENA-VIT) TABS tablet Take 1 tablet by mouth daily.     Yes Historical Provider, MD  repaglinide (PRANDIN) 0.5 MG tablet Take 0.5 mg by mouth 3 (three) times daily before meals.     Yes Historical Provider, MD     Allergies:  Allergies  Allergen Reactions  . Codeine Other (See Comments)    Passes out  . Epinephrine Other (See Comments)    Tachycardia, diaphoresis, syncope  . Percocet (Oxycodone-Acetaminophen) Other (See Comments)    Passes  out    Social History:   reports that she has been smoking Cigarettes.  She has a 50 pack-year smoking history. She has never used smokeless tobacco. She reports  that she does not drink alcohol or use illicit drugs.  Family History: Family History  Problem Relation Age of Onset  . COPD Mother   . Kidney disease Mother   . Heart disease Father   . Lymphoma Son      Physical Exam: Filed Vitals:   07/30/11 2306  BP: 166/52  Pulse: 75  Temp: 98.8 F (37.1 C)  TempSrc: Oral  Resp: 16  SpO2: 97%   Blood pressure 166/52, pulse 75, temperature 98.8 F (37.1 C), temperature source Oral, resp. rate 16, SpO2 97.00%.  GEN:  Pleasant  person lying in the stretcher in no acute distress; cooperative with exam PSYCH:  alert and oriented x4; does  not appear anxious or depressed; affect is appropriate. HEENT: Mucous membranes pink and anicteric; PERRLA; EOM intact; no cervical lymphadenopathy nor thyromegaly or carotid bruit; no JVD; Breasts:: Not examined CHEST WALL: No tenderness.  She has a port on her right upper chest wall CHEST: Normal respiration, clear to auscultation bilaterally HEART: Regular rate and rhythm; no murmurs rubs or gallops BACK: No kyphosis or scoliosis; no CVA tenderness ABDOMEN: Obese, soft non-tender; no masses, no organomegaly, normal abdominal bowel sounds; no pannus; no intertriginous candida. Rectal Exam: Not done EXTREMITIES: No bone or joint deformity; age-appropriate arthropathy of the hands and knees; no edema; no ulcerations. Genitalia: not examined PULSES: 2+ and symmetric SKIN: Normal hydration no rash or ulceration CNS: Cranial nerves 2-12 grossly intact no focal lateralizing neurologic deficit   Labs & Imaging Results for orders placed during the hospital encounter of 07/28/11 (from the past 48 hour(s))  GLUCOSE, CAPILLARY     Status: Abnormal   Collection Time   07/29/11  1:27 AM      Component Value Range Comment   Glucose-Capillary 133 (*) 70 - 99 (mg/dL)   RENAL FUNCTION PANEL     Status: Abnormal   Collection Time   07/29/11  8:55 AM      Component Value Range Comment   Sodium 138  135 - 145 (mEq/L)    Potassium 4.3  3.5 - 5.1 (mEq/L)    Chloride 98  96 - 112 (mEq/L)    CO2 25  19 - 32 (mEq/L)    Glucose, Bld 74  70 - 99 (mg/dL)    BUN 64 (*) 6 - 23 (mg/dL)    Creatinine, Ser 0.86 (*) 0.50 - 1.10 (mg/dL)    Calcium 9.5  8.4 - 10.5 (mg/dL)    Phosphorus 3.8  2.3 - 4.6 (mg/dL)    Albumin 3.1 (*) 3.5 - 5.2 (g/dL)    GFR calc non Af Amer 4 (*) >90 (mL/min)    GFR calc Af Amer 5 (*) >90 (mL/min)   CBC     Status: Abnormal   Collection Time   07/29/11  8:55 AM      Component Value Range Comment   WBC 6.6  4.0 - 10.5 (K/uL)    RBC 4.16  3.87 - 5.11 (MIL/uL)    Hemoglobin 10.7 (*)  12.0 - 15.0 (g/dL)    HCT 57.8 (*) 46.9 - 46.0 (%)    MCV 82.0  78.0 - 100.0 (fL)    MCH 25.7 (*) 26.0 - 34.0 (pg)    MCHC 31.4  30.0 - 36.0 (g/dL)    RDW 62.9 (*) 52.8 - 15.5 (%)    Platelets 185  150 - 400 (K/uL)   DIFFERENTIAL     Status: Abnormal   Collection Time   07/29/11  8:55 AM      Component Value Range Comment   Neutrophils Relative 44  43 - 77 (%)    Neutro Abs 2.9  1.7 - 7.7 (K/uL)    Lymphocytes Relative 39  12 - 46 (%)    Lymphs Abs 2.5  0.7 - 4.0 (K/uL)    Monocytes Relative 13 (*) 3 - 12 (%)    Monocytes Absolute 0.9  0.1 - 1.0 (K/uL)    Eosinophils Relative 4  0 - 5 (%)    Eosinophils Absolute 0.2  0.0 - 0.7 (K/uL)    Basophils Relative 1  0 - 1 (%)    Basophils Absolute 0.0  0.0 - 0.1 (K/uL)   GLUCOSE, CAPILLARY     Status: Normal   Collection Time   07/29/11  9:46 AM      Component Value Range Comment   Glucose-Capillary 81  70 - 99 (mg/dL)   GLUCOSE, CAPILLARY     Status: Abnormal   Collection Time   07/29/11 11:13 AM      Component Value Range Comment   Glucose-Capillary 131 (*) 70 - 99 (mg/dL)    Comment 1 Documented in Chart     HEPATITIS B SURFACE ANTIGEN     Status: Normal   Collection Time   07/29/11 11:20 AM      Component Value Range Comment   Hepatitis B Surface Ag NEGATIVE  NEGATIVE     No results found.  Assessment    65 yo here for scheduled HD     PLAN:    .ESRD (end stage renal disease) on dialysis - as per renal-  I have added PO4, Mag, and Ferritin to her labs. .Diabetes mellitus - SSI  FULL CODE.    Other plans as per orders.    Abid Bolla 07/31/2011, 12:30 AM

## 2011-08-02 ENCOUNTER — Observation Stay (HOSPITAL_COMMUNITY): Payer: Medicare HMO

## 2011-08-02 ENCOUNTER — Observation Stay (HOSPITAL_COMMUNITY)
Admission: EM | Admit: 2011-08-02 | Discharge: 2011-08-03 | Disposition: A | Discharge status: Home / Self Care | Payer: Medicare HMO | Attending: Internal Medicine | Admitting: Internal Medicine

## 2011-08-02 ENCOUNTER — Encounter (HOSPITAL_COMMUNITY): Payer: Self-pay | Admitting: *Deleted

## 2011-08-02 DIAGNOSIS — N186 End stage renal disease: Secondary | ICD-10-CM

## 2011-08-02 DIAGNOSIS — I10 Essential (primary) hypertension: Secondary | ICD-10-CM | POA: Diagnosis present

## 2011-08-02 DIAGNOSIS — E785 Hyperlipidemia, unspecified: Secondary | ICD-10-CM | POA: Insufficient documentation

## 2011-08-02 DIAGNOSIS — E1165 Type 2 diabetes mellitus with hyperglycemia: Secondary | ICD-10-CM

## 2011-08-02 DIAGNOSIS — I120 Hypertensive chronic kidney disease with stage 5 chronic kidney disease or end stage renal disease: Secondary | ICD-10-CM | POA: Insufficient documentation

## 2011-08-02 DIAGNOSIS — E1122 Type 2 diabetes mellitus with diabetic chronic kidney disease: Secondary | ICD-10-CM | POA: Diagnosis present

## 2011-08-02 DIAGNOSIS — E119 Type 2 diabetes mellitus without complications: Secondary | ICD-10-CM | POA: Insufficient documentation

## 2011-08-02 DIAGNOSIS — Z992 Dependence on renal dialysis: Secondary | ICD-10-CM

## 2011-08-02 LAB — DIFFERENTIAL
Eosinophils Relative: 3 % (ref 0–5)
Lymphocytes Relative: 36 % (ref 12–46)
Lymphs Abs: 2.1 10*3/uL (ref 0.7–4.0)
Monocytes Absolute: 0.7 10*3/uL (ref 0.1–1.0)

## 2011-08-02 LAB — RENAL FUNCTION PANEL
CO2: 27 mEq/L (ref 19–32)
Calcium: 9.4 mg/dL (ref 8.4–10.5)
Creatinine, Ser: 7.98 mg/dL — ABNORMAL HIGH (ref 0.50–1.10)
Glucose, Bld: 175 mg/dL — ABNORMAL HIGH (ref 70–99)

## 2011-08-02 LAB — CBC
HCT: 33.7 % — ABNORMAL LOW (ref 36.0–46.0)
MCV: 82.8 fL (ref 78.0–100.0)
RDW: 19 % — ABNORMAL HIGH (ref 11.5–15.5)
WBC: 6 10*3/uL (ref 4.0–10.5)

## 2011-08-02 MED ORDER — ONDANSETRON HCL 8 MG PO TABS: 4.0000 mg | ORAL_TABLET | Freq: Four times a day (QID) | ORAL | Status: DC | PRN

## 2011-08-02 MED ORDER — DOCUSATE SODIUM 100 MG PO CAPS: 200.0000 mg | ORAL_CAPSULE | Freq: Every day | ORAL | Status: DC

## 2011-08-02 MED ORDER — AMLODIPINE BESYLATE 10 MG PO TABS: 10.0000 mg | ORAL_TABLET | Freq: Every day | ORAL | Status: DC

## 2011-08-02 MED ORDER — COLCHICINE 0.6 MG PO TABS: 0.6000 mg | ORAL_TABLET | Freq: Every day | ORAL | Status: DC | PRN

## 2011-08-02 MED ORDER — ENOXAPARIN SODIUM 30 MG/0.3ML ~~LOC~~ SOLN: 30.0000 mg | SUBCUTANEOUS | Status: DC

## 2011-08-02 MED ORDER — CALCIUM ACETATE 667 MG PO CAPS: 1334.0000 mg | ORAL_CAPSULE | Freq: Three times a day (TID) | ORAL | Status: DC

## 2011-08-02 MED ORDER — RENA-VITE PO TABS: 1.0000 | ORAL_TABLET | Freq: Every day | ORAL | Status: DC

## 2011-08-02 MED ORDER — ACETAMINOPHEN 325 MG PO TABS: 650.0000 mg | ORAL_TABLET | Freq: Four times a day (QID) | ORAL | Status: DC | PRN

## 2011-08-02 MED ORDER — CLONIDINE HCL 0.1 MG PO TABS: 0.1000 mg | ORAL_TABLET | Freq: Two times a day (BID) | ORAL | Status: DC

## 2011-08-02 MED ORDER — ACETAMINOPHEN 650 MG RE SUPP: 650.0000 mg | Freq: Four times a day (QID) | RECTAL | Status: DC | PRN

## 2011-08-02 MED ORDER — REPAGLINIDE 0.5 MG PO TABS: 0.5000 mg | ORAL_TABLET | Freq: Three times a day (TID) | ORAL | Status: DC

## 2011-08-02 MED ORDER — ALLOPURINOL 100 MG PO TABS: 100.0000 mg | ORAL_TABLET | Freq: Every day | ORAL | Status: DC

## 2011-08-02 MED ORDER — PARICALCITOL 5 MCG/ML IV SOLN
INTRAVENOUS | Status: AC
  Filled 2011-08-02: qty 1

## 2011-08-02 MED ORDER — HEPARIN SODIUM (PORCINE) 1000 UNIT/ML DIALYSIS
20.0000 [IU]/kg | INTRAMUSCULAR | Status: DC | PRN
  Administered 2011-08-02: 1200 [IU] via INTRAVENOUS_CENTRAL

## 2011-08-02 MED ORDER — DOCUSATE SODIUM 100 MG PO CAPS: 100.0000 mg | ORAL_CAPSULE | Freq: Two times a day (BID) | ORAL | Status: DC

## 2011-08-02 MED ORDER — METOPROLOL SUCCINATE ER 100 MG PO TB24: 100.0000 mg | ORAL_TABLET | Freq: Every day | ORAL | Status: DC

## 2011-08-02 MED ORDER — PARICALCITOL 5 MCG/ML IV SOLN
1.0000 ug | Freq: Once | INTRAVENOUS | Status: AC
  Administered 2011-08-02: 1 ug via INTRAVENOUS

## 2011-08-02 MED ORDER — L-METHYLFOLATE-B6-B12 3-35-2 MG PO TABS: 1.0000 | ORAL_TABLET | Freq: Two times a day (BID) | ORAL | Status: DC

## 2011-08-02 MED ORDER — LOSARTAN POTASSIUM 50 MG PO TABS: 50.0000 mg | ORAL_TABLET | Freq: Every day | ORAL | Status: DC

## 2011-08-02 MED ORDER — ONDANSETRON HCL 4 MG/2ML IJ SOLN: 4.0000 mg | Freq: Four times a day (QID) | INTRAMUSCULAR | Status: DC | PRN

## 2011-08-02 MED ORDER — HYDRALAZINE HCL 50 MG PO TABS: 50.0000 mg | ORAL_TABLET | Freq: Four times a day (QID) | ORAL | Status: DC

## 2011-08-02 NOTE — Consult Note (Signed)
  Pt. In today for dialysis. Reduce med hydrolyzine to tid instead of qid. See dialysis orders.

## 2011-08-02 NOTE — ED Provider Notes (Signed)
Medical screening examination/treatment/procedure(s) were performed by non-physician practitioner and as supervising physician I was immediately available for consultation/collaboration.   Gerhard Munch, MD 08/02/11 2017

## 2011-08-02 NOTE — ED Notes (Signed)
Pt to HD via w/c, pt eating at this time, up with NT, HD RN [name] notified.

## 2011-08-02 NOTE — H&P (Signed)
PCP:   Alexandra Floro, MD, MD   Chief Complaint:  Here for dialysis  HPI: [name] is here for dialysis. She denies any  Other complaints. No SOB or chest pain. Dr Alexandra [name] aware of patient. She states that she has been referred to [facility] and is waiting for the Director of the unit to return from vacation for her to get enrolled.  Review of Systems:  The patient denies anorexia, fever, weight loss,, vision loss, decreased hearing, hoarseness, chest pain, syncope, dyspnea on exertion, peripheral edema, balance deficits, hemoptysis, abdominal pain, melena, hematochezia, severe indigestion/heartburn, hematuria, incontinence, genital sores, muscle weakness, suspicious skin lesions, transient blindness, difficulty walking, depression, unusual weight change, abnormal bleeding, enlarged lymph nodes, angioedema, and breast masses.  Past Medical History: Past Medical History  Diagnosis Date  . Chronic kidney disease     esrd  . Diabetes mellitus   . Hyperlipidemia   . Hypertension   . Renal disorder   . Dialysis care     tues, thurs, sat  . Gout due to renal impairment    Past Surgical History  Procedure Date  . Carotid endarterectomy 2002    left  . Btl   . Rectal fissurectomy   . Av fistula placement 10/30/2010    right brachiocephalic   . Fistulogram     Medications: Prior to Admission medications   Medication Sig Start Date End Date Taking? Authorizing Provider  allopurinol (ZYLOPRIM) 100 MG tablet Take 100 mg by mouth daily.     Yes Historical Provider, MD  amLODipine (NORVASC) 5 MG tablet Take 2 tablets (10 mg total) by mouth at bedtime. 07/13/11  Yes [name], MD  calcium acetate (PHOSLO) 667 MG capsule Take 1,334 mg by mouth 3 (three) times daily with meals.    Yes Historical Provider, MD  cloNIDine (CATAPRES) 0.1 MG tablet Take 0.1 mg by mouth 2 (two) times daily. 07/18/11 07/17/12 Yes [name], MD  Coenzyme Q10 150 MG CAPS Take 300 mg by mouth daily.    Yes  Historical Provider, MD  colchicine 0.6 MG tablet Take 0.6 mg by mouth daily as needed. For gout.   Yes Historical Provider, MD  docusate sodium (COLACE) 100 MG capsule Take 200 mg by mouth at bedtime.     Yes Historical Provider, MD  hydrALAZINE (APRESOLINE) 25 MG tablet Take 50 mg by mouth 4 (four) times daily. 07/18/11  Yes [name], MD  l-methylfolate-B6-B12 (METANX) 3-35-2 MG TABS Take 1 tablet by mouth 2 (two) times daily.    Yes Historical Provider, MD  losartan (COZAAR) 50 MG tablet Take 50 mg by mouth at bedtime.    Yes Historical Provider, MD  metoprolol succinate (TOPROL-XL) 50 MG 24 hr tablet Take 100 mg by mouth at bedtime. 07/18/11  Yes Antonieta Pert, MD  multivitamin (RENA-VIT) TABS tablet Take 1 tablet by mouth daily.     Yes Historical Provider, MD  repaglinide (PRANDIN) 0.5 MG tablet Take 0.5 mg by mouth 3 (three) times daily before meals.     Yes Historical Provider, MD    Allergies:   Allergies  Allergen Reactions  . Codeine Other (See Comments)    Passes out  . Epinephrine Other (See Comments)    Tachycardia, diaphoresis, syncope  . Percocet (Oxycodone-Acetaminophen) Other (See Comments)    Passes  out    Social History:  reports that she has been smoking Cigarettes.  She has a 50 pack-year smoking history. She has never used  smokeless tobacco. She reports that she does not drink alcohol or use illicit drugs.  Family History: Family History  Problem Relation Age of Onset  . COPD Mother   . Kidney disease Mother   . Heart disease Father   . Lymphoma Son     Physical Exam: Filed Vitals:   08/02/11 1902  BP: 161/57  Pulse: 74  Temp: 98.2 F (36.8 C)  TempSrc: Oral  Resp: 18  SpO2: 93%   Sitting up eating, not in distress. No JVD. Lungs clear. S1S2 heard. No murmurs. RRR. Abdomen- soft, non tender. +BS. CNS- grossly intact. Extremeities- no pedal edema.   Labs on Admission:   [facility] 07/31/11 0905  NA 139  K 4.1  CL 99  CO2 27  GLUCOSE  83  BUN 54*  CREATININE 7.91*  CALCIUM 9.4  MG --  PHOS 3.1    Basename 07/31/11 0905  AST --  ALT --  ALKPHOS --  BILITOT --  PROT --  ALBUMIN 3.2*   No results found for this basename: LIPASE:2,AMYLASE:2 in the last 72 hours  Basename 07/31/11 0905  WBC 6.5  NEUTROABS 2.8  HGB 10.8*  HCT 34.3*  MCV 83.1  PLT 159   No results found for this basename: CKTOTAL:3,CKMB:3,CKMBINDEX:3,TROPONINI:3 in the last 72 hours No results found for this basename: TSH,T4TOTAL,FREET3,T3FREE,THYROIDAB in the last 72 hours No results found for this basename: VITAMINB12:2,FOLATE:2,FERRITIN:2,TIBC:2,IRON:2,RETICCTPCT:2 in the last 72 hours  Radiological Exams on Admission: Dg Chest 2 View  07/15/2011  **ADDENDUM** CREATED: 07/15/2011 15:02:42  Requested addendum: No radiographic evidence of TB identified.  **END ADDENDUM** SIGNED BY: Loraine Leriche A. Tyron Russell, M.D.    07/12/2011  *RADIOLOGY REPORT*  Clinical Data: Dialysis patient, history chronic kidney disease, diabetes, hypertension, hyperlipidemia, gout  CHEST - 2 VIEW  Comparison: 01/16/2010  Findings: Right jugular central venous catheter, tip projecting over SVC. Upper-normal size of cardiac silhouette. Mediastinal contours and pulmonary vascularity normal. Lungs appear emphysematous but clear. No pleural effusion or pneumothorax. Bones appear diffusely demineralized. Minimal anterior height loss of a vertebra at the thoracolumbar junction, unchanged.  IMPRESSION: Emphysematous changes. No acute abnormalities.  Original Report Authenticated By: Lollie Marrow, M.D.    Assessment 66 year old female, who has no HD unit, here for dialysis. No acute findings. Plan  .ESRD-Admit obs.  For HD today per Dr Alexandra [name], then likely d/c afterwards. .Diabetes mellitus- seems generally controlled. Check hba1c. Marland KitchenHTN (hypertension), malignant- seems controlled. Resume home meds. Dvt/gi prophylaxis.    Mohan Erven [phone]. 08/02/2011, 8:12 PM

## 2011-08-02 NOTE — ED Notes (Signed)
Pt to ed for dialysis. Denies c/o. Dr frazier in for eval. Called for dinner.

## 2011-08-02 NOTE — ED Provider Notes (Signed)
History     CSN: [id number]  Arrival date & time 08/02/11  4098   First MD Initiated Contact with Patient 08/02/11 1954      Chief Complaint  Patient presents with  . Vascular Access Problem    (Consider location/radiation/quality/duration/timing/severity/associated sxs/prior treatment) HPI Patient is here for her normal dialysis.  Dr. Leretha Dykes has seen the patient and written orders.  Patient does not have any complaint Past Medical History  Diagnosis Date  . Chronic kidney disease     esrd  . Diabetes mellitus   . Hyperlipidemia   . Hypertension   . Renal disorder   . Dialysis care     tues, thurs, sat  . 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 Carotid endarterectomy 2002    left  . Btl   . Rectal fissurectomy   . Av fistula placement 10/30/2010    right brachiocephalic   . Fistulogram     Family History  Problem Relation Age of Onset  . COPD Mother   . Kidney disease Mother   . Heart disease Father   . Lymphoma Son     History  Substance Use Topics  . Smoking status: Current Everyday Smoker -- 1.0 packs/day for 50 years    Types: Cigarettes  . Smokeless tobacco: Never Used  . Alcohol Use: No    OB History    Grav Para Term Preterm Abortions TAB SAB Ect Mult Living                  Review of Systems All other systems negative except as documented in the HPI. All pertinent positives and negatives as reviewed in the HPI.  Allergies  Codeine; Epinephrine; and Percocet  Home Medications   Current Outpatient Rx  Name Route Sig Dispense Refill  . ALLOPURINOL 100 MG PO TABS Oral Take 100 mg by mouth daily.      Marland Kitchen AMLODIPINE BESYLATE 5 MG PO TABS Oral Take 2 tablets (10 mg total) by mouth at bedtime. 30 tablet 0  . CALCIUM ACETATE 667 MG PO CAPS Oral Take 1,334 mg by mouth 3 (three) times daily with meals.     Marland Kitchen CLONIDINE HCL 0.1 MG PO TABS Oral Take 0.1 mg by mouth 2 (two) times daily.    Marland Kitchen COENZYME Q10 150 MG PO  CAPS Oral Take 300 mg by mouth daily.     . COLCHICINE 0.6 MG PO TABS Oral Take 0.6 mg by mouth daily as needed. For gout.    Marland Kitchen DOCUSATE SODIUM 100 MG PO CAPS Oral Take 200 mg by mouth at bedtime.      Marland Kitchen HYDRALAZINE HCL 25 MG PO TABS Oral Take 50 mg by mouth 4 (four) times daily.    . L-METHYLFOLATE-B6-B12 3-35-2 MG PO TABS Oral Take 1 tablet by mouth 2 (two) times daily.     Marland Kitchen LOSARTAN POTASSIUM 50 MG PO TABS Oral Take 50 mg by mouth at bedtime.     Marland Kitchen METOPROLOL SUCCINATE ER 50 MG PO TB24 Oral Take 100 mg by mouth at bedtime.    Marland Kitchen RENA-VITE PO TABS Oral Take 1 tablet by mouth daily.      Marland Kitchen REPAGLINIDE 0.5 MG PO TABS Oral Take 0.5 mg by mouth 3 (three) times daily before meals.        BP 161/57  Pulse 74  Temp(Src) 98.2 F (36.8 C) (Oral)  Resp 18  SpO2 93%  Physical  Exam Physical Examination: General appearance - alert, well appearing, and in no distress, oriented to person, place, and time and normal appearing weight Mental status - alert, oriented to person, place, and time, normal mood, behavior, speech, dress, motor activity, and thought processes  ED Course  Procedures (including critical care time)  [facility] hospitalist seen.  The patient will be admitted to the dialysis floor   MDM          Carlyle Dolly, PA-C 08/02/11 2004

## 2011-08-02 NOTE — ED Notes (Signed)
ED made aware of pt needing to wait once she was up in HD d/t timing of other pts.

## 2011-08-02 NOTE — ED Notes (Addendum)
Pt eating happy meal, alert, NAD, calm, interactive, ambulatory. Pt irrate with the process & wait. Pt waiting for routine HD, HD aware. EDPA and FLOW manager made aware. Pending contact with and orders from [facility] hospitalists. HD ready. Pt ready. Pt advocates by to see pt.

## 2011-08-05 ENCOUNTER — Inpatient Hospital Stay (HOSPITAL_COMMUNITY): Payer: Medicare HMO

## 2011-08-05 ENCOUNTER — Observation Stay (HOSPITAL_COMMUNITY)
Admission: EM | Admit: 2011-08-05 | Discharge: 2011-08-05 | Disposition: A | Discharge status: Home / Self Care | Payer: Medicare HMO | Attending: Internal Medicine | Admitting: Internal Medicine

## 2011-08-05 ENCOUNTER — Encounter (HOSPITAL_COMMUNITY): Payer: Self-pay | Admitting: *Deleted

## 2011-08-05 DIAGNOSIS — Z992 Dependence on renal dialysis: Secondary | ICD-10-CM | POA: Insufficient documentation

## 2011-08-05 DIAGNOSIS — I10 Essential (primary) hypertension: Secondary | ICD-10-CM

## 2011-08-05 DIAGNOSIS — N186 End stage renal disease: Secondary | ICD-10-CM | POA: Insufficient documentation

## 2011-08-05 DIAGNOSIS — E119 Type 2 diabetes mellitus without complications: Secondary | ICD-10-CM | POA: Insufficient documentation

## 2011-08-05 DIAGNOSIS — M79609 Pain in unspecified limb: Secondary | ICD-10-CM

## 2011-08-05 DIAGNOSIS — M7989 Other specified soft tissue disorders: Secondary | ICD-10-CM | POA: Insufficient documentation

## 2011-08-05 DIAGNOSIS — I120 Hypertensive chronic kidney disease with stage 5 chronic kidney disease or end stage renal disease: Secondary | ICD-10-CM | POA: Insufficient documentation

## 2011-08-05 DIAGNOSIS — E785 Hyperlipidemia, unspecified: Secondary | ICD-10-CM | POA: Insufficient documentation

## 2011-08-05 LAB — DIFFERENTIAL
Basophils Absolute: 0.1 10*3/uL (ref 0.0–0.1)
Basophils Relative: 1 % (ref 0–1)
Eosinophils Relative: 3 % (ref 0–5)
Monocytes Absolute: 0.9 10*3/uL (ref 0.1–1.0)
Neutro Abs: 3.1 10*3/uL (ref 1.7–7.7)

## 2011-08-05 LAB — CBC
HCT: 32.8 % — ABNORMAL LOW (ref 36.0–46.0)
MCHC: 32 g/dL (ref 30.0–36.0)
RDW: 18.8 % — ABNORMAL HIGH (ref 11.5–15.5)

## 2011-08-05 LAB — RENAL FUNCTION PANEL
Calcium: 9.5 mg/dL (ref 8.4–10.5)
GFR calc Af Amer: 5 mL/min — ABNORMAL LOW (ref >90)
Glucose, Bld: 118 mg/dL — ABNORMAL HIGH (ref 70–99)
Phosphorus: 3.7 mg/dL (ref 2.3–4.6)
Sodium: 135 mEq/L (ref 135–145)

## 2011-08-05 LAB — GLUCOSE, CAPILLARY: Glucose-Capillary: 100 mg/dL — ABNORMAL HIGH (ref 70–99)

## 2011-08-05 MED ORDER — CLONIDINE HCL 0.1 MG PO TABS
0.1000 mg | ORAL_TABLET | Freq: Two times a day (BID) | ORAL | Status: DC
  Administered 2011-08-05: 0.1 mg via ORAL
  Filled 2011-08-05 (×2): qty 1

## 2011-08-05 MED ORDER — PARICALCITOL 5 MCG/ML IV SOLN
1.0000 ug | Freq: Once | INTRAVENOUS | Status: AC
  Administered 2011-08-05: 1 ug via INTRAVENOUS

## 2011-08-05 MED ORDER — DARBEPOETIN ALFA-POLYSORBATE 100 MCG/0.5ML IJ SOLN
100.0000 ug | Freq: Once | INTRAMUSCULAR | Status: AC
  Administered 2011-08-05: 100 ug via INTRAVENOUS
  Filled 2011-08-05: qty 0.5

## 2011-08-05 MED ORDER — METOPROLOL SUCCINATE ER 100 MG PO TB24
100.0000 mg | ORAL_TABLET | Freq: Every day | ORAL | Status: DC
  Filled 2011-08-05: qty 1

## 2011-08-05 MED ORDER — PARICALCITOL 1 MCG PO CAPS
1.0000 ug | ORAL_CAPSULE | Freq: Every day | ORAL | Status: DC
  Filled 2011-08-05: qty 1

## 2011-08-05 MED ORDER — HYDRALAZINE HCL 50 MG PO TABS
50.0000 mg | ORAL_TABLET | Freq: Four times a day (QID) | ORAL | Status: DC
  Administered 2011-08-05: 50 mg via ORAL
  Filled 2011-08-05 (×4): qty 1

## 2011-08-05 MED ORDER — HEPARIN SODIUM (PORCINE) 1000 UNIT/ML IJ SOLN
1200.0000 [IU] | Freq: Once | INTRAMUSCULAR | Status: AC
  Administered 2011-08-05: 1200 [IU] via INTRAVENOUS

## 2011-08-05 MED ORDER — DARBEPOETIN ALFA-POLYSORBATE 100 MCG/0.5ML IJ SOLN
INTRAMUSCULAR | Status: AC
  Administered 2011-08-05: 100 ug via INTRAVENOUS
  Filled 2011-08-05: qty 0.5

## 2011-08-05 MED ORDER — PARICALCITOL 5 MCG/ML IV SOLN
INTRAVENOUS | Status: AC
  Administered 2011-08-05: 1 ug via INTRAVENOUS
  Filled 2011-08-05: qty 1

## 2011-08-05 NOTE — Progress Notes (Signed)
VASCULAR LAB PRELIMINARY  PRELIMINARY  PRELIMINARY  PRELIMINARY  Right upper extremity venous duplex completed.    Preliminary report:  Right:  No evidence of DVT or superficial thrombosis.  AVF patent.  There appears to be a dilatation of the cephalic vein just proximal to the anastomosis.  This is at the area of most discomfort and is directly under the incision for the anastomosis.  Terance Hart, RVT 08/05/2011, 12:48 PM

## 2011-08-05 NOTE — Consult Note (Signed)
  Pt. Has aswollen  Right arm  It has a fistular in that arm she needs to see vascular. Dr Myra Gianotti  Did the surgery.the patient  To see Dr Myra Gianotti.

## 2011-08-05 NOTE — ED Provider Notes (Signed)
History     CSN: [id number]  Arrival date & time 08/05/11  0011   First MD Initiated Contact with Patient 08/05/11 0032      Chief Complaint  Patient presents with  . Chronic Renal Failure  . Arm Swelling    HPI Pt was seen at 0035.  Per pt, c/o gradual onset and persistence of constant ESRD on HD for the past several years.  Pt states she is here for her usual HD treatment.  Pt also c/o gradual onset and persistence of constant RUE "swelling" for the past few days.  Denies injury, no rash, no fevers.  Denies any other complaints.     Past Medical History  Diagnosis Date  . Chronic kidney disease     esrd  . Diabetes mellitus   . Hyperlipidemia   . Hypertension   . Renal disorder   . Dialysis care     tues, thurs, sat  . 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 Carotid endarterectomy 2002    left  . Btl   . Rectal fissurectomy   . Av fistula placement 10/30/2010    right brachiocephalic   . Fistulogram     Family History  Problem Relation Age of Onset  . COPD Mother   . Kidney disease Mother   . Heart disease Father   . Lymphoma Son     History  Substance Use Topics  . Smoking status: Current Everyday Smoker -- 1.0 packs/day for 50 years    Types: Cigarettes  . Smokeless tobacco: Never Used  . Alcohol Use: No    Review of Systems ROS: Statement: All systems negative except as marked or noted in the HPI; Constitutional: Negative for fever and chills. ; ; Eyes: Negative for eye pain, redness and discharge. ; ; ENMT: Negative for ear pain, hoarseness, nasal congestion, sinus pressure and sore throat. ; ; Cardiovascular: Negative for chest pain, palpitations, diaphoresis, dyspnea and peripheral edema. ; ; Respiratory: Negative for cough, wheezing and stridor. ; ; Gastrointestinal: Negative for nausea, vomiting, diarrhea, abdominal pain, blood in stool, hematemesis, jaundice and rectal bleeding. . ; ; Genitourinary: Negative for  dysuria, flank pain and hematuria. ; ; Musculoskeletal: +swelling RUE.  Negative for back pain and neck pain. Negative for trauma.; ; Skin: Negative for pruritus, rash, abrasions, blisters, bruising and skin lesion.; ; Neuro: Negative for headache, lightheadedness and neck stiffness. Negative for weakness, altered level of consciousness , altered mental status, extremity weakness, paresthesias, involuntary movement, seizure and syncope.      Allergies  Codeine; Epinephrine; and Percocet  Home Medications   Current Outpatient Rx  Name Route Sig Dispense Refill  . ALLOPURINOL 100 MG PO TABS Oral Take 100 mg by mouth daily.      Marland Kitchen AMLODIPINE BESYLATE 5 MG PO TABS Oral Take 10 mg by mouth daily.    Marland Kitchen CALCIUM ACETATE 667 MG PO CAPS Oral Take 667 mg by mouth 4 (four) times daily.     Marland Kitchen CLONIDINE HCL 0.1 MG PO TABS Oral Take 0.1 mg by mouth 2 (two) times daily.    Marland Kitchen COENZYME Q10 150 MG PO CAPS Oral Take 300 mg by mouth daily.     . COLCHICINE 0.6 MG PO TABS Oral Take 0.6 mg by mouth daily as needed. For gout.    Marland Kitchen DOCUSATE SODIUM 100 MG PO CAPS Oral Take 200 mg by mouth at bedtime.      Marland Kitchen  HYDRALAZINE HCL 25 MG PO TABS Oral Take 50 mg by mouth 4 (four) times daily.    . L-METHYLFOLATE-B6-B12 3-35-2 MG PO TABS Oral Take 1 tablet by mouth 2 (two) times daily.     Marland Kitchen LOSARTAN POTASSIUM 50 MG PO TABS Oral Take 50 mg by mouth at bedtime.     Marland Kitchen METOPROLOL SUCCINATE ER 50 MG PO TB24 Oral Take 100 mg by mouth at bedtime.    Marland Kitchen RENA-VITE PO TABS Oral Take 1 tablet by mouth daily.      Marland Kitchen REPAGLINIDE 0.5 MG PO TABS Oral Take 0.5 mg by mouth 3 (three) times daily before meals.        BP 182/58  Pulse 78  Temp(Src) 98.4 F (36.9 C) (Oral)  Resp 16  SpO2 97%  Physical Exam 0040: Physical examination:  Nursing notes reviewed; Vital signs and O2 SAT reviewed;  Constitutional: Well developed, Well nourished, Well hydrated, In no acute distress; Head:  Normocephalic, atraumatic; Eyes: EOMI, PERRL; ENMT: Mouth  and pharynx normal, Mucous membranes moist; Neck: Supple, Full range of motion; Cardiovascular: Regular rate and rhythm, No gallop; Respiratory: Breath sounds clear & equal bilaterally, No wheezes, speaking full sentences with ease, Normal respiratory effort/excursion; Chest: +HD cath right subclavian area, Movement normal;; Extremities: Pulses normal, No tenderness, +RUE 1+ edema, no rash; Neuro: AA&Ox3, Major CN grossly intact.  Speech clear, no facial droop. Moves all ext well on stretcher without apparent gross focal motor deficits; Skin: Color normal, Warm, Dry.    ED Course  Procedures    MDM  MDM Reviewed: previous chart, nursing note and vitals     0045:  Dr. Bascom Levels has already been called.  T/C to [facility] Dr. Adela Glimpse, case discussed, including:  HPI, pertinent PM/SHx, VS/PE, dx testing, ED course and treatment:  Agreeable to admit, she will come to ED for eval.        Laray Anger, DO 08/05/11 1401

## 2011-08-05 NOTE — ED Notes (Signed)
Dialysis called at this time.  After patient eats her breakfast, they should be ready to receive her.

## 2011-08-05 NOTE — Discharge Summary (Signed)
AMA NOTE   Patient ID: Alexandra [name] MRN: [id number] DOB/AGE: 1945/08/30 66 y.o.  Admit date: 08/05/2011 Discharge date: 08/05/2011  Primary Care Physician:  [name], MD, MD  Diagnoses:    .ESRD (end stage renal disease) on dialysis .Diabetes mellitus .Arm swelling: no dvt on the doppler ultrasound .HTN (hypertension), malignant  Consults:  Renal service Dr. Marya Landry                 (Please note that patient left against medical advice after the hemodialysis)  Discharge Medications: NONE            Brief H and P: For complete details please refer to admission H and P, but in brief 66 year old female with history of chronic kidney disease, on hemodialysis, diabetes, hyperlipidemia, hypertension presented needing to be dialyzed. Patient also reported two-day history of right arm swelling but no pain or fever. Patient was admitted under observation, renal service, Dr. Bascom Levels was consulted. Patient underwent hemodialysis per Dr. Janey Greaser orders. She was placed on heparin drip for the concern of DVT in the right arm. Doppler ultrasound was done on the right arm which was negative for any DVT. Given that the patient has fistula in the right arm, Dr. Leretha Dykes made an appointment for the patient on June 10 at 11 AM with Dr. Myra Gianotti (vascular surgery). Patient left AGAINST MEDICAL ADVICE after her hemanalysis.     BP 175/71  Pulse 78  Temp(Src) 98 F (36.7 C) (Oral)  Resp 20  Wt 60.15 kg (132 lb 9.7 oz)  SpO2 93%  Physical Exam: General: Alert and awake oriented x3 not in any acute distress. HEENT: anicteric sclera, pupils reactive to light and accommodation CVS: S1-S2 clear no murmur rubs or gallops Chest: clear to auscultation bilaterally, no wheezing rales or rhonchi Abdomen: soft nontender, nondistended, normal bowel sounds, no organomegaly Extremities: no cyanosis, clubbing + right arm edema   The results of significant diagnostics from this hospitalization  (including imaging, microbiology, ancillary and laboratory) are listed below for reference.    LAB RESULTS: Basic Metabolic Panel:  Lab 08/05/11 1308 08/02/11 1947  NA 135 137  K 4.1 4.3  CL 94* 96  CO2 26 27  GLUCOSE 118* 175*  BUN 51* 55*  CREATININE 7.93* 7.98*  CALCIUM 9.5 9.4  MG -- --  PHOS 3.7 --   Liver Function Tests:  Lab 08/05/11 0818 08/02/11 1947  AST -- --  ALT -- --  ALKPHOS -- --  BILITOT -- --  PROT -- --  ALBUMIN 3.2* 3.2*   CBC:  Lab 08/05/11 0818 08/02/11 1947  WBC 6.6 6.0  NEUTROABS 3.1 --  HGB 10.5* 10.7*  HCT 32.8* 33.7*  MCV 80.2 --  PLT 156 199   CBG:  Lab 08/05/11 0240 07/31/11 0829  GLUCAP 100* 87    Significant Diagnostic Studies:  No results found.   Disposition and Follow-up: Discharge Orders    Future Appointments: Provider: Department: Dept Phone: Center:   08/23/2011 11:20 AM Evern Bio, NP [facility] [phone] [facility]       Signed:  Thad Ranger M.D. [facility] 08/05/2011, 1:25 PM

## 2011-08-05 NOTE — ED Notes (Addendum)
Here for routine HD. HD on T, TH, S. Last HD was Tuesday, access in R upper arm, denies physical sx but, mentions "some swelling to R hand and R upper arm".

## 2011-08-05 NOTE — ED Notes (Signed)
hospitalist at bedside for pt evaluation and orders for hemodialysis.

## 2011-08-05 NOTE — ED Notes (Signed)
Patient resting in room with eyes closed.  No distress noted.  Pt waiting for dialysis.

## 2011-08-05 NOTE — ED Notes (Signed)
Dialysis called with report pt will not received dialysis until 6 o'clock or later per [facility]. Plan of care is updated with verbal understanding. Pt watching tv INAD, will continue to monitor pt.

## 2011-08-05 NOTE — H&P (Signed)
PCP:   [name], MD, MD    Chief Complaint:   Needs dialysis  HPI: Alexandra [name] is a 66 y.o. female   has a past medical history of Chronic kidney disease; Diabetes mellitus; Hyperlipidemia; Hypertension; Renal disorder; Dialysis care; and Gout due to renal impairment.   Presented with  Needing to be Dialyzed. Also reports 2 day history of Right arm swelling, no pain no fever. She states that she sleped on her arm and since then noted the swelling. Also noted that she did not realize that her metoprolol was increased few weeks ago and have not been taking higher dose up until yesterday.  Yesterday she did take 50 mg of metoprolol but her new dose should be 100mg  po qhs. Will need the prescription for this prior to discharge.  Review of Systems:    Pertinent positives include: right arm swelling  Constitutional:  No weight loss, night sweats, Fevers, chills, fatigue, weight loss  HEENT:  No headaches, Difficulty swallowing,Tooth/dental problems,Sore throat,  No sneezing, itching, ear ache, nasal congestion, post nasal drip,  Cardio-vascular:  No chest pain, Orthopnea, PND, anasarca, dizziness, palpitations.no Bilateral lower extremity swelling  GI:  No heartburn, indigestion, abdominal pain, nausea, vomiting, diarrhea, change in bowel habits, loss of appetite, melena, blood in stool, hematemesis Resp:  no shortness of breath at rest. No dyspnea on exertion, No excess mucus, no productive cough, No non-productive cough, No coughing up of blood.No change in color of mucus.No wheezing. Skin:  no rash or lesions. No jaundice GU:  no dysuria, change in color of urine, no urgency or frequency. No straining to urinate.  No flank pain.  Musculoskeletal:  No joint pain or no joint swelling. No decreased range of motion. No back pain.  Psych:  No change in mood or affect. No depression or anxiety. No memory loss.  Neuro: no localizing neurological complaints, no  tingling, no weakness, no double vision, no gait abnormality, no slurred speech, no confusion  Otherwise ROS are negative except for above, 10 systems were reviewed  Past Medical History: Past Medical History  Diagnosis Date  . Chronic kidney disease     esrd  . Diabetes mellitus   . Hyperlipidemia   . Hypertension   . Renal disorder   . Dialysis care     tues, thurs, sat  . Gout due to renal impairment    Past Surgical History  Procedure Date  . Carotid endarterectomy 2002    left  . Btl   . Rectal fissurectomy   . Av fistula placement 10/30/2010    right brachiocephalic   . Fistulogram      Medications: Prior to Admission medications   Medication Sig Start Date End Date Taking? Authorizing Provider  allopurinol (ZYLOPRIM) 100 MG tablet Take 100 mg by mouth daily.     Yes Historical Provider, MD  amLODipine (NORVASC) 5 MG tablet Take 10 mg by mouth daily. 07/13/11  Yes [name], MD  calcium acetate (PHOSLO) 667 MG capsule Take 667 mg by mouth 4 (four) times daily.    Yes Historical Provider, MD  cloNIDine (CATAPRES) 0.1 MG tablet Take 0.1 mg by mouth 2 (two) times daily. 07/18/11 07/17/12 Yes [name], MD  Coenzyme Q10 150 MG CAPS Take 300 mg by mouth daily.    Yes Historical Provider, MD  colchicine 0.6 MG tablet Take 0.6 mg by mouth daily as needed. For gout.   Yes Historical Provider, MD  docusate sodium (COLACE) 100  MG capsule Take 200 mg by mouth at bedtime.     Yes Historical Provider, MD  hydrALAZINE (APRESOLINE) 25 MG tablet Take 50 mg by mouth 4 (four) times daily. 07/18/11  Yes [name], MD  l-methylfolate-B6-B12 (METANX) 3-35-2 MG TABS Take 1 tablet by mouth 2 (two) times daily.    Yes Historical Provider, MD  losartan (COZAAR) 50 MG tablet Take 50 mg by mouth at bedtime.    Yes Historical Provider, MD  metoprolol succinate (TOPROL-XL) 50 MG 24 hr tablet Take 100 mg by mouth at bedtime. 07/18/11  Yes Antonieta Pert, MD  multivitamin (RENA-VIT) TABS  tablet Take 1 tablet by mouth daily.     Yes Historical Provider, MD  repaglinide (PRANDIN) 0.5 MG tablet Take 0.5 mg by mouth 3 (three) times daily before meals.     Yes Historical Provider, MD    Allergies:   Allergies  Allergen Reactions  . Codeine Other (See Comments)    Passes out  . Epinephrine Other (See Comments)    Tachycardia, diaphoresis, syncope  . Percocet (Oxycodone-Acetaminophen) Other (See Comments)    Passes  out    Social History:    reports that she has been smoking Cigarettes.  She has a 50 pack-year smoking history. She has never used smokeless tobacco. She reports that she does not drink alcohol or use illicit drugs.   Family History: family history includes COPD in her mother; Heart disease in her father; Kidney disease in her mother; and Lymphoma in her son.    Physical Exam: Patient Vitals for the past 24 hrs:  BP Temp Temp src Pulse Resp SpO2  08/05/11 0017 182/58 mmHg 98.4 F (36.9 C) Oral 78  16  97 %    1. General:  in No Acute distress 2. Psychological: Alert and  Oriented 3. Head/ENT:   Moist  Mucous Membranes                          Head Non traumatic, neck supple                          Normal  Dentition 4. SKIN: normal Skin turgor,  Skin clean Dry and intact no rash 5. Heart: Regular rate and rhythm no Murmur, Rub or gallop 6. Lungs: Clear to auscultation bilaterally, no wheezes or crackles   7. Abdomen: Soft, non-tender, Non distended 8. Lower extremities: no clubbing, cyanosis, trace edema edema, Right arm swelling with pitting edema 9. Neurologically Grossly intact, moving all 4 extremities equally 10. MSK: Normal range of motion  body mass index is unknown because there is no height or weight on file.   Labs on Admission:   [facility] 08/02/11 1947  NA 137  K 4.3  CL 96  CO2 27  GLUCOSE 175*  BUN 55*  CREATININE 7.98*  CALCIUM 9.4  MG --  PHOS 2.8    Basename 08/02/11 1947  AST --  ALT --  ALKPHOS --  BILITOT --    PROT --  ALBUMIN 3.2*   No results found for this basename: LIPASE:2,AMYLASE:2 in the last 72 hours  Basename 08/02/11 1947  WBC 6.0  NEUTROABS 3.0  HGB 10.7*  HCT 33.7*  MCV 82.8  PLT 199   No results found for this basename: CKTOTAL:3,CKMB:3,CKMBINDEX:3,TROPONINI:3 in the last 72 hours No results found for this basename: TSH,T4TOTAL,FREET3,T3FREE,THYROIDAB in the last 72 hours No results found for this  basename: VITAMINB12:2,FOLATE:2,FERRITIN:2,TIBC:2,IRON:2,RETICCTPCT:2 in the last 72 hours Lab Results  Component Value Date   HGBA1C 5.4 08/02/2011    The CrCl is unknown because both a height and weight (above a minimum accepted value) are required for this calculation. ABG    Component Value Date/Time   TCO2 29 06/10/2010 0739     No results found for this basename: DDIMER     Cultures:    Component Value Date/Time   SDES URINE, CLEAN CATCH 08/20/2009 2114   SPECREQUEST NONE 08/20/2009 2114   CULT NO GROWTH 08/20/2009 2114   REPTSTATUS 08/22/2009 FINAL 08/20/2009 2114       Radiological Exams on Admission: No results found.  Assessment/Plan  66 yo Female here for her HD, but also with new finding of right arm swelling with possible DVT  Present on Admission:  .ESRD (end stage renal disease) on dialysis - will admit, nephrology to write HD orders .Diabetes mellitus - SSI, and continue home medication .Arm swelling - Possibility of DVT, patient did not wish to be on lovenox but was agreeable to heparin which is easier to dose given her ESRD. Will order heparin per pharmacy and upper ext doppler. If no evidence of DVT heparin can be d/c tomorrow and she could be discharged. Marland KitchenHTN (hypertension), malignant - will write for her medications but will not adjust since she was not taken them properly. Will need prescription for metoprolol prior to discharge.   Prophylaxis: heparin gtt  CODE STATUS: full code  I have spent a total of 55 min on this  admission  Graysen Woodyard 08/05/2011, 1:23 AM

## 2011-08-05 NOTE — ED Notes (Addendum)
Patient changed into a hospital gown at this time. Breakfast ordered at this time.

## 2011-08-06 ENCOUNTER — Emergency Department (HOSPITAL_COMMUNITY): Payer: Medicare HMO

## 2011-08-06 ENCOUNTER — Encounter (HOSPITAL_COMMUNITY): Payer: Self-pay | Admitting: *Deleted

## 2011-08-06 ENCOUNTER — Emergency Department (HOSPITAL_COMMUNITY)
Admission: EM | Admit: 2011-08-06 | Discharge: 2011-08-09 | Disposition: A | Discharge status: Home / Self Care | Payer: Medicare HMO | Attending: Internal Medicine | Admitting: Internal Medicine

## 2011-08-06 DIAGNOSIS — N186 End stage renal disease: Secondary | ICD-10-CM | POA: Insufficient documentation

## 2011-08-06 DIAGNOSIS — Z992 Dependence on renal dialysis: Secondary | ICD-10-CM | POA: Insufficient documentation

## 2011-08-06 DIAGNOSIS — E119 Type 2 diabetes mellitus without complications: Secondary | ICD-10-CM | POA: Insufficient documentation

## 2011-08-06 DIAGNOSIS — E785 Hyperlipidemia, unspecified: Secondary | ICD-10-CM | POA: Insufficient documentation

## 2011-08-06 DIAGNOSIS — Z862 Personal history of diseases of the blood and blood-forming organs and certain disorders involving the immune mechanism: Secondary | ICD-10-CM | POA: Insufficient documentation

## 2011-08-06 DIAGNOSIS — Z79899 Other long term (current) drug therapy: Secondary | ICD-10-CM | POA: Insufficient documentation

## 2011-08-06 DIAGNOSIS — I120 Hypertensive chronic kidney disease with stage 5 chronic kidney disease or end stage renal disease: Secondary | ICD-10-CM | POA: Insufficient documentation

## 2011-08-06 DIAGNOSIS — F17200 Nicotine dependence, unspecified, uncomplicated: Secondary | ICD-10-CM | POA: Insufficient documentation

## 2011-08-06 DIAGNOSIS — Z8639 Personal history of other endocrine, nutritional and metabolic disease: Secondary | ICD-10-CM | POA: Insufficient documentation

## 2011-08-06 LAB — CBC
Hemoglobin: 11.5 g/dL — ABNORMAL LOW (ref 12.0–15.0)
MCH: 25.4 pg — ABNORMAL LOW (ref 26.0–34.0)
MCV: 80.1 fL (ref 78.0–100.0)
RBC: 4.52 MIL/uL (ref 3.87–5.11)
WBC: 8.7 10*3/uL (ref 4.0–10.5)

## 2011-08-06 LAB — RENAL FUNCTION PANEL
CO2: 28 mEq/L (ref 19–32)
Calcium: 9.5 mg/dL (ref 8.4–10.5)
Chloride: 96 mEq/L (ref 96–112)
GFR calc Af Amer: 8 mL/min — ABNORMAL LOW (ref >90)
GFR calc non Af Amer: 7 mL/min — ABNORMAL LOW (ref >90)
Glucose, Bld: 187 mg/dL — ABNORMAL HIGH (ref 70–99)
Sodium: 136 mEq/L (ref 135–145)

## 2011-08-06 MED ORDER — PARICALCITOL 5 MCG/ML IV SOLN
INTRAVENOUS | Status: AC
  Administered 2011-08-06: 1 ug via INTRAVENOUS
  Filled 2011-08-06: qty 1

## 2011-08-06 MED ORDER — PARICALCITOL 5 MCG/ML IV SOLN
1.0000 ug | Freq: Once | INTRAVENOUS | Status: AC
  Administered 2011-08-06: 1 ug via INTRAVENOUS
  Filled 2011-08-06: qty 0.2

## 2011-08-06 MED ORDER — HEPARIN SODIUM (PORCINE) 1000 UNIT/ML DIALYSIS
1200.0000 [IU] | INTRAMUSCULAR | Status: DC | PRN
  Filled 2011-08-06: qty 2

## 2011-08-06 NOTE — ED Notes (Signed)
Ordered Renal diet for patient.

## 2011-08-06 NOTE — ED Notes (Signed)
Called dialysis and advised the RN, Sheralyn Boatman that patient is here for dialysis

## 2011-08-06 NOTE — Consult Note (Signed)
  Pt in ER chest  Slt. Rales   Dialysis orders  Written.

## 2011-08-06 NOTE — ED Notes (Signed)
Patient is here for dialysis.   

## 2011-08-06 NOTE — ED Notes (Signed)
Dialysis contacted; to contact after 2200 when there may be an availability.

## 2011-08-09 ENCOUNTER — Observation Stay (HOSPITAL_COMMUNITY): Payer: Medicare HMO

## 2011-08-09 ENCOUNTER — Observation Stay (HOSPITAL_COMMUNITY)
Admission: EM | Admit: 2011-08-09 | Discharge: 2011-08-10 | Disposition: A | Discharge status: Home / Self Care | Payer: Medicare HMO | Attending: Internal Medicine | Admitting: Internal Medicine

## 2011-08-09 ENCOUNTER — Encounter (HOSPITAL_COMMUNITY): Payer: Self-pay | Admitting: *Deleted

## 2011-08-09 DIAGNOSIS — I10 Essential (primary) hypertension: Secondary | ICD-10-CM

## 2011-08-09 DIAGNOSIS — E119 Type 2 diabetes mellitus without complications: Secondary | ICD-10-CM | POA: Insufficient documentation

## 2011-08-09 DIAGNOSIS — N186 End stage renal disease: Secondary | ICD-10-CM

## 2011-08-09 DIAGNOSIS — E1165 Type 2 diabetes mellitus with hyperglycemia: Secondary | ICD-10-CM

## 2011-08-09 DIAGNOSIS — Z992 Dependence on renal dialysis: Principal | ICD-10-CM | POA: Insufficient documentation

## 2011-08-09 DIAGNOSIS — E785 Hyperlipidemia, unspecified: Secondary | ICD-10-CM | POA: Diagnosis present

## 2011-08-09 DIAGNOSIS — I120 Hypertensive chronic kidney disease with stage 5 chronic kidney disease or end stage renal disease: Secondary | ICD-10-CM | POA: Insufficient documentation

## 2011-08-09 LAB — GLUCOSE, CAPILLARY: Glucose-Capillary: 216 mg/dL — ABNORMAL HIGH (ref 70–99)

## 2011-08-09 LAB — DIFFERENTIAL
Basophils Relative: 1 % (ref 0–1)
Eosinophils Absolute: 0.2 10*3/uL (ref 0.0–0.7)
Lymphs Abs: 2.5 10*3/uL (ref 0.7–4.0)
Neutro Abs: 4.4 10*3/uL (ref 1.7–7.7)
Neutrophils Relative %: 56 % (ref 43–77)

## 2011-08-09 LAB — CBC
Hemoglobin: 10.8 g/dL — ABNORMAL LOW (ref 12.0–15.0)
MCH: 25.1 pg — ABNORMAL LOW (ref 26.0–34.0)
Platelets: 198 10*3/uL (ref 150–400)
RBC: 4.31 MIL/uL (ref 3.87–5.11)

## 2011-08-09 MED ORDER — REPAGLINIDE 0.5 MG PO TABS
0.5000 mg | ORAL_TABLET | Freq: Three times a day (TID) | ORAL | Status: DC
  Administered 2011-08-09: 0.5 mg via ORAL
  Filled 2011-08-09 (×3): qty 1

## 2011-08-09 MED ORDER — HEPARIN SODIUM (PORCINE) 1000 UNIT/ML DIALYSIS
20.0000 [IU]/kg | INTRAMUSCULAR | Status: DC | PRN
  Filled 2011-08-09: qty 2

## 2011-08-09 MED ORDER — PARICALCITOL 5 MCG/ML IV SOLN
1.0000 ug | Freq: Once | INTRAVENOUS | Status: AC
  Administered 2011-08-09: 1 ug via INTRAVENOUS
  Filled 2011-08-09: qty 0.2

## 2011-08-09 MED ORDER — PARICALCITOL 5 MCG/ML IV SOLN
INTRAVENOUS | Status: AC
  Administered 2011-08-09: 1 ug via INTRAVENOUS
  Filled 2011-08-09: qty 1

## 2011-08-09 NOTE — ED Provider Notes (Signed)
History     CSN: [id number]  Arrival date & time 08/09/11  2130   First MD Initiated Contact with Patient 08/09/11 1857      Chief Complaint  Patient presents with  . needs dialysis     (Consider location/radiation/quality/duration/timing/severity/associated sxs/prior treatment) HPI Pt with history of ESRD on HD who comes to the ED for her routine dialysis. Reports she has been doing well since her previous dialysis 2 days ago. She denies any CP, SOB, weight gain, fever or swelling.   Past Medical History  Diagnosis Date  . Chronic kidney disease     esrd  . Diabetes mellitus   . Hyperlipidemia   . Hypertension   . Renal disorder   . Dialysis care     tues, thurs, sat  . 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 Carotid endarterectomy 2002    left  . Btl   . Rectal fissurectomy   . Av fistula placement 10/30/2010    right brachiocephalic   . Fistulogram     Family History  Problem Relation Age of Onset  . COPD Mother   . Kidney disease Mother   . Heart disease Father   . Lymphoma Son     History  Substance Use Topics  . Smoking status: Current Everyday Smoker -- 1.0 packs/day for 50 years    Types: Cigarettes  . Smokeless tobacco: Never Used  . Alcohol Use: No    OB History    Grav Para Term Preterm Abortions TAB SAB Ect Mult Living                  Review of Systems All other systems reviewed and are negative except as noted in HPI.   Allergies  Codeine; Epinephrine; and Percocet  Home Medications   Current Outpatient Rx  Name Route Sig Dispense Refill  . ALLOPURINOL 100 MG PO TABS Oral Take 100 mg by mouth daily.      Marland Kitchen AMLODIPINE BESYLATE 5 MG PO TABS Oral Take 10 mg by mouth daily.    Marland Kitchen CALCIUM ACETATE 667 MG PO CAPS Oral Take 667 mg by mouth 4 (four) times daily.     Marland Kitchen CLONIDINE HCL 0.1 MG PO TABS Oral Take 0.1 mg by mouth 2 (two) times daily.    Marland Kitchen COENZYME Q10 150 MG PO CAPS Oral Take 300 mg by mouth  daily.     . COLCHICINE 0.6 MG PO TABS Oral Take 0.6 mg by mouth daily as needed. For gout.    Marland Kitchen DOCUSATE SODIUM 100 MG PO CAPS Oral Take 200 mg by mouth at bedtime.      Marland Kitchen HYDRALAZINE HCL 25 MG PO TABS Oral Take 50 mg by mouth 4 (four) times daily.    . L-METHYLFOLATE-B6-B12 3-35-2 MG PO TABS Oral Take 1 tablet by mouth 2 (two) times daily.     Marland Kitchen LOSARTAN POTASSIUM 50 MG PO TABS Oral Take 50 mg by mouth at bedtime.     Marland Kitchen METOPROLOL SUCCINATE ER 50 MG PO TB24 Oral Take 100 mg by mouth at bedtime.    Marland Kitchen RENA-VITE PO TABS Oral Take 1 tablet by mouth daily.      Marland Kitchen REPAGLINIDE 0.5 MG PO TABS Oral Take 0.5 mg by mouth 3 (three) times daily before meals.        BP 197/41  Pulse 73  Temp(Src) 98.1 F (36.7 C) (Oral)  Resp 20  SpO2  95%  Physical Exam  Nursing note and vitals reviewed. Constitutional: She is oriented to person, place, and time. She appears well-developed and well-nourished.  HENT:  Head: Normocephalic and atraumatic.  Eyes: EOM are normal. Pupils are equal, round, and reactive to light.  Neck: Normal range of motion. Neck supple.  Cardiovascular: Normal rate, normal heart sounds and intact distal pulses.   Pulmonary/Chest: Effort normal and breath sounds normal.  Abdominal: Bowel sounds are normal. She exhibits no distension.  Musculoskeletal: Normal range of motion. She exhibits no edema and no tenderness.  Neurological: She is alert and oriented to person, place, and time. She has normal strength. No cranial nerve deficit or sensory deficit.  Skin: Skin is warm and dry. No rash noted.  Psychiatric: She has a normal mood and affect.    ED Course  Procedures (including critical care time)  Labs Reviewed - No data to display No results found.   1. ESRD on hemodialysis       MDM  Discussed with [facility]. Will also inform Dr. Bascom Levels that the patient is here for her regularly scheduled dialysis.       Rene Sizelove B. Bernette Mayers, MD 08/09/11 1925

## 2011-08-09 NOTE — ED Notes (Signed)
DR Shelda Pal called to get an order for Prandin 0.5mg  per request of MS Offutt

## 2011-08-09 NOTE — ED Notes (Signed)
The pt is here for dialysis 

## 2011-08-09 NOTE — ED Notes (Signed)
Dr. Bascom Levels paged to [phone]

## 2011-08-09 NOTE — H&P (Signed)
Alexandra [name] is an 66 y.o. female.   Nephrologist - Dr.Frazier. Chief Complaint: Has come for regular dialysis. HPI: 66 year-old female with known history of ESRD on hemodialysis, diabetes mellitus2, hypertension, gout has come for her regular dialysis. Denies any chest pain, shortness of breath, abdominal pain, nausea vomiting. Nephrologist Dr. Bascom Levels will be arranging for her dialysis.  Past Medical History  Diagnosis Date  . Chronic kidney disease     esrd  . Diabetes mellitus   . Hyperlipidemia   . Hypertension   . Renal disorder   . Dialysis care     tues, thurs, sat  . 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 Carotid endarterectomy 2002    left  . Btl   . Rectal fissurectomy   . Av fistula placement 10/30/2010    right brachiocephalic   . Fistulogram     Family History  Problem Relation Age of Onset  . COPD Mother   . Kidney disease Mother   . Heart disease Father   . Lymphoma Son    Social History:  reports that she has been smoking Cigarettes.  She has a 50 pack-year smoking history. She has never used smokeless tobacco. She reports that she does not drink alcohol or use illicit drugs.  Allergies:  Allergies  Allergen Reactions  . Codeine Other (See Comments)    Passes out  . Epinephrine Other (See Comments)    Tachycardia, diaphoresis, syncope  . Percocet (Oxycodone-Acetaminophen) Other (See Comments)    Passes  out     (Not in a hospital admission)  No results found for this or any previous visit (from the past 48 hour(s)). No results found.  Review of Systems  Constitutional: Negative.   HENT: Negative.   Eyes: Negative.   Respiratory: Negative.   Cardiovascular: Negative.   Gastrointestinal: Negative.   Genitourinary: Negative.   Musculoskeletal: Negative.   Skin: Negative.   Neurological: Negative.   Endo/Heme/Allergies: Negative.   Psychiatric/Behavioral: Negative.     Blood pressure 197/41,  pulse 73, temperature 98.1 F (36.7 C), temperature source Oral, resp. rate 20, SpO2 95.00%. Physical Exam  Constitutional: She is oriented to person, place, and time. She appears well-developed and well-nourished. No distress.  HENT:  Head: Normocephalic and atraumatic.  Right Ear: External ear normal.  Left Ear: External ear normal.  Mouth/Throat: No oropharyngeal exudate.  Eyes: Conjunctivae are normal. Pupils are equal, round, and reactive to light. Right eye exhibits no discharge. Left eye exhibits no discharge. No scleral icterus.  Neck: Normal range of motion. Neck supple.  Cardiovascular: Normal rate, regular rhythm and normal heart sounds.   Respiratory: Effort normal. No respiratory distress. She has no wheezes. She has no rales.  GI: Soft. Bowel sounds are normal. She exhibits no distension. There is no tenderness. There is no rebound.  Musculoskeletal: Normal range of motion. She exhibits no edema and no tenderness.  Neurological: She is alert and oriented to person, place, and time.       Moves all extremities.  Skin: Skin is warm and dry. She is not diaphoretic.  Psychiatric: Her behavior is normal.     Assessment/Plan #1. ESRD on hemodialysis - dialysis per nephrologist. Patient is not in acute distress. #2. Hypertension - blood pressure is mildly elevated recheck after dialysis. Continue present medications. #3. Diabetes mellitus2 - continue present medications. Check CBG a.c. and at bedtime.  CODE STATUS - full code.  Midge Minium  N. 08/09/2011, 8:08 PM

## 2011-08-09 NOTE — ED Notes (Signed)
Updated MS Offutt on plan for dialysis at 2245

## 2011-08-09 NOTE — Consult Note (Signed)
  Pt. In the ER states that she feels better. Dialysis orders written.

## 2011-08-09 NOTE — ED Notes (Signed)
Dialysis called  For Pt at 2245

## 2011-08-10 LAB — RENAL FUNCTION PANEL
Albumin: 3.3 g/dL — ABNORMAL LOW (ref 3.5–5.2)
Chloride: 98 mEq/L (ref 96–112)
GFR calc Af Amer: 5 mL/min — ABNORMAL LOW (ref >90)
Phosphorus: 1.9 mg/dL — ABNORMAL LOW (ref 2.3–4.6)
Potassium: 4.1 mEq/L (ref 3.5–5.1)
Sodium: 138 mEq/L (ref 135–145)

## 2011-08-11 ENCOUNTER — Observation Stay (HOSPITAL_COMMUNITY)
Admission: EM | Admit: 2011-08-11 | Discharge: 2011-08-12 | Discharge status: Against Medical Advice | Payer: Medicare HMO | Attending: Internal Medicine | Admitting: Internal Medicine

## 2011-08-11 DIAGNOSIS — Z992 Dependence on renal dialysis: Secondary | ICD-10-CM | POA: Insufficient documentation

## 2011-08-11 DIAGNOSIS — E119 Type 2 diabetes mellitus without complications: Secondary | ICD-10-CM | POA: Insufficient documentation

## 2011-08-11 DIAGNOSIS — E1122 Type 2 diabetes mellitus with diabetic chronic kidney disease: Secondary | ICD-10-CM | POA: Diagnosis present

## 2011-08-11 DIAGNOSIS — I120 Hypertensive chronic kidney disease with stage 5 chronic kidney disease or end stage renal disease: Secondary | ICD-10-CM | POA: Insufficient documentation

## 2011-08-11 DIAGNOSIS — N186 End stage renal disease: Secondary | ICD-10-CM | POA: Insufficient documentation

## 2011-08-11 DIAGNOSIS — I10 Essential (primary) hypertension: Secondary | ICD-10-CM | POA: Diagnosis present

## 2011-08-11 DIAGNOSIS — E785 Hyperlipidemia, unspecified: Secondary | ICD-10-CM | POA: Insufficient documentation

## 2011-08-11 NOTE — ED Notes (Signed)
Pt here for regular dialysis.  Denies any other problems.  Dialysis floor notified.

## 2011-08-12 ENCOUNTER — Observation Stay (HOSPITAL_COMMUNITY): Payer: Medicare HMO

## 2011-08-12 ENCOUNTER — Encounter (HOSPITAL_COMMUNITY): Payer: Self-pay | Admitting: Internal Medicine

## 2011-08-12 DIAGNOSIS — N186 End stage renal disease: Secondary | ICD-10-CM

## 2011-08-12 DIAGNOSIS — E1165 Type 2 diabetes mellitus with hyperglycemia: Secondary | ICD-10-CM

## 2011-08-12 DIAGNOSIS — I10 Essential (primary) hypertension: Secondary | ICD-10-CM

## 2011-08-12 LAB — RENAL FUNCTION PANEL
CO2: 26 mEq/L (ref 19–32)
Calcium: 9.5 mg/dL (ref 8.4–10.5)
Creatinine, Ser: 8.29 mg/dL — ABNORMAL HIGH (ref 0.50–1.10)
GFR calc Af Amer: 5 mL/min — ABNORMAL LOW (ref >90)
Glucose, Bld: 116 mg/dL — ABNORMAL HIGH (ref 70–99)
Phosphorus: 3 mg/dL (ref 2.3–4.6)
Sodium: 138 mEq/L (ref 135–145)

## 2011-08-12 LAB — CBC
HCT: 33.3 % — ABNORMAL LOW (ref 36.0–46.0)
MCH: 25.1 pg — ABNORMAL LOW (ref 26.0–34.0)
MCV: 79.7 fL (ref 78.0–100.0)
Platelets: 176 10*3/uL (ref 150–400)
RDW: 19.1 % — ABNORMAL HIGH (ref 11.5–15.5)

## 2011-08-12 LAB — DIFFERENTIAL
Eosinophils Absolute: 0.2 10*3/uL (ref 0.0–0.7)
Eosinophils Relative: 3 % (ref 0–5)
Lymphs Abs: 2 10*3/uL (ref 0.7–4.0)
Monocytes Absolute: 0.7 10*3/uL (ref 0.1–1.0)

## 2011-08-12 MED ORDER — DARBEPOETIN ALFA-POLYSORBATE 100 MCG/0.5ML IJ SOLN
100.0000 ug | Freq: Once | INTRAMUSCULAR | Status: DC
  Filled 2011-08-12: qty 0.5

## 2011-08-12 MED ORDER — L-METHYLFOLATE-B6-B12 3-35-2 MG PO TABS
1.0000 | ORAL_TABLET | Freq: Two times a day (BID) | ORAL | Status: DC
  Filled 2011-08-12 (×2): qty 1

## 2011-08-12 MED ORDER — ALLOPURINOL 100 MG PO TABS
100.0000 mg | ORAL_TABLET | Freq: Every day | ORAL | Status: DC
  Filled 2011-08-12: qty 1

## 2011-08-12 MED ORDER — COLCHICINE 0.6 MG PO TABS
0.6000 mg | ORAL_TABLET | Freq: Every day | ORAL | Status: DC | PRN
  Filled 2011-08-12: qty 1

## 2011-08-12 MED ORDER — HYDRALAZINE HCL 50 MG PO TABS
50.0000 mg | ORAL_TABLET | Freq: Four times a day (QID) | ORAL | Status: DC
  Filled 2011-08-12 (×4): qty 1

## 2011-08-12 MED ORDER — CLONIDINE HCL 0.1 MG PO TABS
0.1000 mg | ORAL_TABLET | Freq: Two times a day (BID) | ORAL | Status: DC
  Administered 2011-08-12: 0.1 mg via ORAL
  Filled 2011-08-12 (×3): qty 1

## 2011-08-12 MED ORDER — CALCIUM ACETATE 667 MG PO CAPS
667.0000 mg | ORAL_CAPSULE | Freq: Four times a day (QID) | ORAL | Status: DC
  Filled 2011-08-12 (×3): qty 1

## 2011-08-12 MED ORDER — PARICALCITOL 5 MCG/ML IV SOLN
2.0000 ug | Freq: Once | INTRAVENOUS | Status: DC
Start: 2011-08-12 — End: 2011-08-12
  Filled 2011-08-12: qty 0.4

## 2011-08-12 MED ORDER — RENA-VITE PO TABS
1.0000 | ORAL_TABLET | Freq: Every day | ORAL | Status: DC
  Filled 2011-08-12: qty 1

## 2011-08-12 MED ORDER — PARICALCITOL 5 MCG/ML IV SOLN
INTRAVENOUS | Status: AC
  Filled 2011-08-12: qty 1

## 2011-08-12 MED ORDER — DOCUSATE SODIUM 100 MG PO CAPS
200.0000 mg | ORAL_CAPSULE | Freq: Every day | ORAL | Status: DC
Start: 2011-08-12 — End: 2011-08-12

## 2011-08-12 MED ORDER — METOPROLOL SUCCINATE ER 100 MG PO TB24
100.0000 mg | ORAL_TABLET | Freq: Every day | ORAL | Status: DC
  Filled 2011-08-12: qty 1

## 2011-08-12 MED ORDER — REPAGLINIDE 0.5 MG PO TABS
0.5000 mg | ORAL_TABLET | Freq: Three times a day (TID) | ORAL | Status: DC
  Administered 2011-08-12: 0.5 mg via ORAL
  Filled 2011-08-12 (×4): qty 1

## 2011-08-12 MED ORDER — AMLODIPINE BESYLATE 10 MG PO TABS
10.0000 mg | ORAL_TABLET | Freq: Every day | ORAL | Status: DC
  Filled 2011-08-12 (×2): qty 1

## 2011-08-12 MED ORDER — DARBEPOETIN ALFA-POLYSORBATE 100 MCG/0.5ML IJ SOLN
INTRAMUSCULAR | Status: AC
  Filled 2011-08-12: qty 0.5

## 2011-08-12 MED ORDER — ONDANSETRON HCL 4 MG PO TABS: 4.0000 mg | ORAL_TABLET | Freq: Four times a day (QID) | ORAL | Status: DC | PRN

## 2011-08-12 MED ORDER — LOSARTAN POTASSIUM 50 MG PO TABS
50.0000 mg | ORAL_TABLET | Freq: Every day | ORAL | Status: DC
  Filled 2011-08-12: qty 1

## 2011-08-12 MED ORDER — ONDANSETRON HCL 4 MG/2ML IJ SOLN: 4.0000 mg | Freq: Four times a day (QID) | INTRAMUSCULAR | Status: DC | PRN

## 2011-08-12 NOTE — ED Notes (Signed)
Called Dialysis to tell them pt is ready. Spoke with Genevie Cheshire and she said Marylene Land would be taking this pt and she would have [facility]lene Land call me back.

## 2011-08-12 NOTE — Discharge Summary (Signed)
HOSPITAL DISCHARGE SUMMARY  Alexandra [name]  MRN: [id number]  DOB:Sep 10, 1945  Date of Admission: 08/11/2011 Date of Discharge: 08/12/2011         LOS: 1 day   Attending Physician:  Clydia Llano A  Patient's PCP:  [name], MD, MD  Consults: Jeri Cos  Discharge Diagnoses: Present on Admission:  .ESRD (end stage renal disease) on dialysis .Diabetes mellitus .HTN (hypertension), benign   Brief Admission History: 66 year-old female with known history of ESRD on hemodialysis, diabetes mellitus2, hypertension, gout has come for routine dialysis. Denies any chest pain, shortness of breath. Dr. Bascom Levels has been notified by the ER physician and will be arranging for dialysis.  Hospital Course: Present on Admission:  .ESRD (end stage renal disease) on dialysis .Diabetes mellitus .HTN (hypertension), benign  Patient left the hospital AGAINST MEDICAL ADVICE after she is done from her hemodialysis   Day of Discharge BP 207/75  Pulse 79  Temp(Src) 96.8 F (36 C) (Oral)  Resp 16  Wt 57.4 kg (126 lb 8.7 oz)  SpO2 94% Physical Exam: Not examined at the time of discharge    Results for orders placed during the hospital encounter of 08/11/11 (from the past 24 hour(s))  GLUCOSE, CAPILLARY     Status: Normal   Collection Time   08/12/11  5:32 AM      Component Value Range   Glucose-Capillary 77  70 - 99 (mg/dL)  CBC     Status: Abnormal   Collection Time   08/12/11 10:39 AM      Component Value Range   WBC 5.3  4.0 - 10.5 (K/uL)   RBC 4.18  3.87 - 5.11 (MIL/uL)   Hemoglobin 10.5 (*) 12.0 - 15.0 (g/dL)   HCT 40.9 (*) 81.1 - 46.0 (%)   MCV 79.7  78.0 - 100.0 (fL)   MCH 25.1 (*) 26.0 - 34.0 (pg)   MCHC 31.5  30.0 - 36.0 (g/dL)   RDW 91.4 (*) 78.2 - 15.5 (%)   Platelets 176  150 - 400 (K/uL)  DIFFERENTIAL     Status: Abnormal   Collection Time   08/12/11 10:39 AM      Component Value Range   Neutrophils Relative 44  43 - 77 (%)   Neutro Abs 2.3  1.7 - 7.7  (K/uL)   Lymphocytes Relative 38  12 - 46 (%)   Lymphs Abs 2.0  0.7 - 4.0 (K/uL)   Monocytes Relative 14 (*) 3 - 12 (%)   Monocytes Absolute 0.7  0.1 - 1.0 (K/uL)   Eosinophils Relative 3  0 - 5 (%)   Eosinophils Absolute 0.2  0.0 - 0.7 (K/uL)   Basophils Relative 1  0 - 1 (%)   Basophils Absolute 0.0  0.0 - 0.1 (K/uL)  RENAL FUNCTION PANEL     Status: Abnormal   Collection Time   08/12/11 10:39 AM      Component Value Range   Sodium 138  135 - 145 (mEq/L)   Potassium 4.2  3.5 - 5.1 (mEq/L)   Chloride 97  96 - 112 (mEq/L)   CO2 26  19 - 32 (mEq/L)   Glucose, Bld 116 (*) 70 - 99 (mg/dL)   BUN 57 (*) 6 - 23 (mg/dL)   Creatinine, Ser 9.56 (*) 0.50 - 1.10 (mg/dL)   Calcium 9.5  8.4 - 21.3 (mg/dL)   Phosphorus 3.0  2.3 - 4.6 (mg/dL)   Albumin 3.2 (*) 3.5 - 5.2 (g/dL)   GFR calc non  Af Amer 4 (*) >90 (mL/min)   GFR calc Af Amer 5 (*) >90 (mL/min)       Follow-up Appts: Discharge Orders    Future Appointments: Provider: Department: Dept Phone: Center:   08/23/2011 11:20 AM Evern Bio, NP [facility] [phone] [facility]        Signed:  Clydia Llano A 08/12/2011, 2:18 PM

## 2011-08-12 NOTE — Progress Notes (Signed)
DAILY PROGRESS NOTE                              GENERAL INTERNAL MEDICINE [facility]  SUBJECTIVE: Patient was seen while she getting her dialysis Denies any complaints denies any shortness of breath or fever  OBJECTIVE: BP 207/75  Pulse 79  Temp(Src) 96.8 F (36 C) (Oral)  Resp 16  Wt 57.4 kg (126 lb 8.7 oz)  SpO2 94%  Intake/Output Summary (Last 24 hours) at 08/12/11 1412 Last data filed at 08/12/11 1310  Gross per 24 hour  Intake      0 ml  Output   2094 ml  Net  -2094 ml                      Weight change:  Physical Exam: General: Alert and awake oriented x3 not in any acute distress. HEENT: anicteric sclera, pupils equal reactive to light and accommodation CVS: S1-S2 heard, no murmur rubs or gallops Chest: clear to auscultation bilaterally, no wheezing rales or rhonchi Abdomen:  normal bowel sounds, soft, nontender, nondistended, no organomegaly Neuro: Cranial nerves II-XII intact, no focal neurological deficits Extremities: no cyanosis, no clubbing or edema noted bilaterally   Lab Results:  Basename 08/12/11 1039 08/09/11 2255  NA 138 138  K 4.2 4.1  CL 97 98  CO2 26 26  GLUCOSE 116* 217*  BUN 57* 64*  CREATININE 8.29* 8.97*  CALCIUM 9.5 9.9  MG -- --  PHOS 3.0 1.9*    Basename 08/12/11 1039 08/09/11 2255  AST -- --  ALT -- --  ALKPHOS -- --  BILITOT -- --  PROT -- --  ALBUMIN 3.2* 3.3*   No results found for this basename: LIPASE:2,AMYLASE:2 in the last 72 hours  Basename 08/12/11 1039 08/09/11 2255  WBC 5.3 7.9  NEUTROABS 2.3 4.4  HGB 10.5* 10.8*  HCT 33.3* 34.2*  MCV 79.7 79.4  PLT 176 198   No results found for this basename: CKTOTAL:3,CKMB:3,CKMBINDEX:3,TROPONINI:3 in the last 72 hours No components found with this basename: POCBNP:3 No results found for this basename: DDIMER:2 in the last 72 hours No results found for this basename: HGBA1C:2 in the last 72 hours No results found for this basename:  CHOL:2,HDL:2,LDLCALC:2,TRIG:2,CHOLHDL:2,LDLDIRECT:2 in the last 72 hours No results found for this basename: TSH,T4TOTAL,FREET3,T3FREE,THYROIDAB in the last 72 hours No results found for this basename: VITAMINB12:2,FOLATE:2,FERRITIN:2,TIBC:2,IRON:2,RETICCTPCT:2 in the last 72 hours  Micro Results: No results found for this or any previous visit (from the past 240 hour(s)).  Studies/Results: No results found. Medications: Scheduled Meds:   . allopurinol  100 mg Oral Daily  . amLODipine  10 mg Oral Daily  . calcium acetate  667 mg Oral QID  . cloNIDine  0.1 mg Oral BID  . darbepoetin (ARANESP) injection - DIALYSIS  100 mcg Intravenous Once  . docusate sodium  200 mg Oral QHS  . hydrALAZINE  50 mg Oral QID  . l-methylfolate-B6-B12  1 tablet Oral BID  . losartan  50 mg Oral QHS  . metoprolol succinate  100 mg Oral QHS  . multivitamin  1 tablet Oral QHS  . paricalcitol  2 mcg Intravenous Once in dialysis  . repaglinide  0.5 mg Oral TID AC   Continuous Infusions:  PRN Meds:.colchicine, ondansetron (ZOFRAN) IV, ondansetron  ASSESSMENT & PLAN: Active Problems:  ESRD (end stage renal disease) on dialysis  HTN (hypertension), benign  Diabetes mellitus  End-stage renal disease -Hemodialysis per Dr. Bascom Levels. -Patient is getting her hemodialysis in the hospital.   HTN -Controlled, continue preadmission medications.  Diabetes mellitus type 2 -Hemoglobin A1c was checked on 08/02/2011 which was 5.4%. Controlled -Continue preadmission home medication, patient is on Prandin 0.5 mg   LOS: 1 day   Marice Angelino A 08/12/2011, 2:12 PM

## 2011-08-12 NOTE — H&P (Signed)
Alexandra [name] is an 66 y.o. female.   Nephrologist - Dr.Frazier. Chief Complaint: Has come for routine dialysis. HPI: 66 year-old female with known history of ESRD on hemodialysis, diabetes mellitus2, hypertension, gout has come for routine dialysis. Denies any chest pain, shortness of breath. Dr. Bascom Levels has been notified by the ER physician and will be arranging for dialysis.  Past Medical History  Diagnosis Date  . Chronic kidney disease     esrd  . Diabetes mellitus   . Hyperlipidemia   . Hypertension   . Renal disorder   . Dialysis care     tues, thurs, sat  . 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 Carotid endarterectomy 2002    left  . Btl   . Rectal fissurectomy   . Av fistula placement 10/30/2010    right brachiocephalic   . Fistulogram     Family History  Problem Relation Age of Onset  . COPD Mother   . Kidney disease Mother   . Heart disease Father   . Lymphoma Son    Social History:  reports that she has been smoking Cigarettes.  She has a 50 pack-year smoking history. She has never used smokeless tobacco. She reports that she does not drink alcohol or use illicit drugs.  Allergies:  Allergies  Allergen Reactions  . Codeine Other (See Comments)    Passes out  . Epinephrine Other (See Comments)    Tachycardia, diaphoresis, syncope  . Percocet (Oxycodone-Acetaminophen) Other (See Comments)    Passes  out     (Not in a hospital admission)  No results found for this or any previous visit (from the past 48 hour(s)). No results found.  Review of Systems  Constitutional: Negative.   HENT: Negative.   Eyes: Negative.   Respiratory: Negative.   Cardiovascular: Negative.   Gastrointestinal: Negative.   Genitourinary: Negative.   Musculoskeletal: Negative.   Skin: Negative.   Neurological: Negative.   Endo/Heme/Allergies: Negative.   Psychiatric/Behavioral: Negative.     Blood pressure 194/74, pulse 79,  temperature 97.4 F (36.3 C), temperature source Oral, resp. rate 16, SpO2 96.00%. Physical Exam  Constitutional: She is oriented to person, place, and time. She appears well-developed and well-nourished. No distress.  HENT:  Head: Normocephalic and atraumatic.  Right Ear: External ear normal.  Left Ear: External ear normal.  Nose: Nose normal.  Mouth/Throat: Oropharynx is clear and moist. No oropharyngeal exudate.  Eyes: Conjunctivae are normal. Pupils are equal, round, and reactive to light. Right eye exhibits no discharge. Left eye exhibits no discharge. No scleral icterus.  Neck: Normal range of motion. Neck supple.  Cardiovascular: Normal rate and regular rhythm.   Respiratory: Effort normal and breath sounds normal. No respiratory distress. She has no wheezes. She has no rales.  GI: Soft. Bowel sounds are normal. She exhibits no distension. There is no tenderness. There is no rebound.  Musculoskeletal: Normal range of motion. She exhibits no edema and no tenderness.  Neurological: She is alert and oriented to person, place, and time.       Moves all extremities.  Skin: Skin is warm and dry. No rash noted. She is not diaphoretic. No erythema.  Psychiatric: Her behavior is normal.     Assessment/Plan #1. ESRD on hemodialysis - dialysis per nephrologist. #2. Hypertension - continue home medications. Follow vital signs after dialysis. #3. Diabetes mellitus2 - continue home medications. Check CBGs a.c. and at bedtime.  CODE STATUS - full code.  Bea Duren N. 08/12/2011, 2:17 AM

## 2011-08-12 NOTE — ED Notes (Signed)
Received pt. From triage, pt. Alert and oriented, gait steady, NAD noted

## 2011-08-12 NOTE — ED Provider Notes (Signed)
History     CSN: [id number]  Arrival date & time 08/11/11  2353   First MD Initiated Contact with Patient 08/12/11 [phone]      Chief Complaint  Patient presents with  . Vascular Access Problem    (Consider location/radiation/quality/duration/timing/severity/associated sxs/prior treatment) The history is provided by the patient.   here for routine dialysis. Last dialysis 2 days ago. No chest pain, shortness of breath or complaints. No leg swelling or edema otherwise. Patient of Dr. Janey Greaser and normally does not get lab work done prior to her dialysis. No pain or radiation.  Past Medical History  Diagnosis Date  . Chronic kidney disease     esrd  . Diabetes mellitus   . Hyperlipidemia   . Hypertension   . Renal disorder   . Dialysis care     tues, thurs, sat  . 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 Carotid endarterectomy 2002    left  . Btl   . Rectal fissurectomy   . Av fistula placement 10/30/2010    right brachiocephalic   . Fistulogram     Family History  Problem Relation Age of Onset  . COPD Mother   . Kidney disease Mother   . Heart disease Father   . Lymphoma Son     History  Substance Use Topics  . Smoking status: Current Everyday Smoker -- 1.0 packs/day for 50 years    Types: Cigarettes  . Smokeless tobacco: Never Used  . Alcohol Use: No    OB History    Grav Para Term Preterm Abortions TAB SAB Ect Mult Living                  Review of Systems  Constitutional: Negative for fever and chills.  HENT: Negative for neck pain and neck stiffness.   Eyes: Negative for pain.  Respiratory: Negative for shortness of breath.   Cardiovascular: Negative for chest pain.  Gastrointestinal: Negative for abdominal pain.  Genitourinary: Negative for dysuria.  Musculoskeletal: Negative for back pain.  Skin: Negative for rash.  Neurological: Negative for headaches.  All other systems reviewed and are  negative.    Allergies  Codeine; Epinephrine; and Percocet  Home Medications   Current Outpatient Rx  Name Route Sig Dispense Refill  . ALLOPURINOL 100 MG PO TABS Oral Take 100 mg by mouth daily.      Marland Kitchen AMLODIPINE BESYLATE 5 MG PO TABS Oral Take 10 mg by mouth daily.    Marland Kitchen CALCIUM ACETATE 667 MG PO CAPS Oral Take 667 mg by mouth 4 (four) times daily.     Marland Kitchen CLONIDINE HCL 0.1 MG PO TABS Oral Take 0.1 mg by mouth 2 (two) times daily.    Marland Kitchen COENZYME Q10 150 MG PO CAPS Oral Take 300 mg by mouth daily.     . COLCHICINE 0.6 MG PO TABS Oral Take 0.6 mg by mouth daily as needed. For gout.    Marland Kitchen DOCUSATE SODIUM 100 MG PO CAPS Oral Take 200 mg by mouth at bedtime.      Marland Kitchen HYDRALAZINE HCL 25 MG PO TABS Oral Take 50 mg by mouth 4 (four) times daily.    . L-METHYLFOLATE-B6-B12 3-35-2 MG PO TABS Oral Take 1 tablet by mouth 2 (two) times daily.     Marland Kitchen LOSARTAN POTASSIUM 50 MG PO TABS Oral Take 50 mg by mouth at bedtime.     Marland Kitchen METOPROLOL SUCCINATE ER 50 MG  PO TB24 Oral Take 100 mg by mouth at bedtime.    Marland Kitchen RENA-VITE PO TABS Oral Take 1 tablet by mouth daily.      Marland Kitchen REPAGLINIDE 0.5 MG PO TABS Oral Take 0.5 mg by mouth 3 (three) times daily before meals.        BP 189/63  Pulse 83  Temp(Src) 98.1 F (36.7 C) (Oral)  Resp 18  SpO2 98%  Physical Exam  Constitutional: She is oriented to person, place, and time. She appears well-developed and well-nourished.  HENT:  Head: Normocephalic and atraumatic.  Eyes: Conjunctivae and EOM are normal. Pupils are equal, round, and reactive to light.  Neck: Trachea normal. Neck supple. No thyromegaly present.  Cardiovascular: Normal rate, regular rhythm, S1 normal, S2 normal and normal pulses.     No systolic murmur is present   No diastolic murmur is present  Pulses:      Radial pulses are 2+ on the right side, and 2+ on the left side.  Pulmonary/Chest: Effort normal and breath sounds normal. She has no wheezes. She has no rhonchi. She has no rales. She  exhibits no tenderness.  Abdominal: Soft. Normal appearance and bowel sounds are normal. There is no tenderness. There is no CVA tenderness and negative Murphy's sign.  Musculoskeletal:       BLE:s Calves nontender, no cords or erythema, negative Homans sign  Neurological: She is alert and oriented to person, place, and time. She has normal strength. No cranial nerve deficit or sensory deficit. GCS eye subscore is 4. GCS verbal subscore is 5. GCS motor subscore is 6.  Skin: Skin is warm and dry. No rash noted. She is not diaphoretic.  Psychiatric: Her speech is normal.       Cooperative and appropriate    ED Course  Procedures (including critical care time)  1:18 AM d/w Dr Bascom Levels, recs no labs and will write dialysis orders for am, plan [facility] admit OBS  1:30 AM discussed with Dr.Kakrakandy, [facility], will admit MDM   Here for routine dialysis. Discussed with nephrologist and hospitalist as above. Medical admission for dialysis in the morning        Sunnie Nielsen, MD 08/12/11 [phone]

## 2011-08-12 NOTE — ED Notes (Signed)
Spoke with Alexandra [name] in Dialysis - they are ready for pt.

## 2011-08-13 DIAGNOSIS — E119 Type 2 diabetes mellitus without complications: Secondary | ICD-10-CM | POA: Insufficient documentation

## 2011-08-13 DIAGNOSIS — E785 Hyperlipidemia, unspecified: Secondary | ICD-10-CM | POA: Insufficient documentation

## 2011-08-13 DIAGNOSIS — Z992 Dependence on renal dialysis: Principal | ICD-10-CM | POA: Insufficient documentation

## 2011-08-13 DIAGNOSIS — N186 End stage renal disease: Secondary | ICD-10-CM | POA: Insufficient documentation

## 2011-08-13 DIAGNOSIS — I120 Hypertensive chronic kidney disease with stage 5 chronic kidney disease or end stage renal disease: Secondary | ICD-10-CM | POA: Insufficient documentation

## 2011-08-14 ENCOUNTER — Observation Stay (HOSPITAL_COMMUNITY): Payer: Medicare HMO

## 2011-08-14 ENCOUNTER — Observation Stay (HOSPITAL_COMMUNITY)
Admission: EM | Admit: 2011-08-14 | Discharge: 2011-08-14 | Discharge status: Against Medical Advice | Payer: Medicare HMO | Attending: Internal Medicine | Admitting: Internal Medicine

## 2011-08-14 ENCOUNTER — Encounter (HOSPITAL_COMMUNITY): Payer: Self-pay | Admitting: Emergency Medicine

## 2011-08-14 ENCOUNTER — Other Ambulatory Visit (HOSPITAL_COMMUNITY): Payer: Self-pay | Admitting: Internal Medicine

## 2011-08-14 DIAGNOSIS — N186 End stage renal disease: Secondary | ICD-10-CM | POA: Diagnosis present

## 2011-08-14 DIAGNOSIS — E118 Type 2 diabetes mellitus with unspecified complications: Secondary | ICD-10-CM

## 2011-08-14 DIAGNOSIS — Z8739 Personal history of other diseases of the musculoskeletal system and connective tissue: Secondary | ICD-10-CM

## 2011-08-14 DIAGNOSIS — E1165 Type 2 diabetes mellitus with hyperglycemia: Secondary | ICD-10-CM

## 2011-08-14 DIAGNOSIS — I10 Essential (primary) hypertension: Secondary | ICD-10-CM | POA: Diagnosis present

## 2011-08-14 DIAGNOSIS — N189 Chronic kidney disease, unspecified: Secondary | ICD-10-CM | POA: Diagnosis present

## 2011-08-14 DIAGNOSIS — N2581 Secondary hyperparathyroidism of renal origin: Secondary | ICD-10-CM | POA: Diagnosis present

## 2011-08-14 DIAGNOSIS — E1122 Type 2 diabetes mellitus with diabetic chronic kidney disease: Secondary | ICD-10-CM | POA: Diagnosis present

## 2011-08-14 DIAGNOSIS — Z992 Dependence on renal dialysis: Secondary | ICD-10-CM

## 2011-08-14 DIAGNOSIS — E785 Hyperlipidemia, unspecified: Secondary | ICD-10-CM | POA: Diagnosis present

## 2011-08-14 DIAGNOSIS — D631 Anemia in chronic kidney disease: Secondary | ICD-10-CM | POA: Diagnosis present

## 2011-08-14 LAB — BASIC METABOLIC PANEL
BUN: 44 mg/dL — ABNORMAL HIGH (ref 6–23)
Calcium: 9.4 mg/dL (ref 8.4–10.5)
Creatinine, Ser: 6.46 mg/dL — ABNORMAL HIGH (ref 0.50–1.10)
GFR calc Af Amer: 7 mL/min — ABNORMAL LOW (ref >90)
GFR calc non Af Amer: 6 mL/min — ABNORMAL LOW (ref >90)

## 2011-08-14 LAB — CBC
MCHC: 30.7 g/dL (ref 30.0–36.0)
Platelets: 154 10*3/uL (ref 150–400)
RDW: 19 % — ABNORMAL HIGH (ref 11.5–15.5)
WBC: 6.2 10*3/uL (ref 4.0–10.5)

## 2011-08-14 MED ORDER — SODIUM CHLORIDE 0.9 % IJ SOLN: 3.0000 mL | Freq: Two times a day (BID) | INTRAMUSCULAR | Status: DC

## 2011-08-14 MED ORDER — INSULIN ASPART 100 UNIT/ML ~~LOC~~ SOLN: 0.0000 [IU] | Freq: Every day | SUBCUTANEOUS | Status: DC

## 2011-08-14 MED ORDER — PARICALCITOL 5 MCG/ML IV SOLN
INTRAVENOUS | Status: AC
  Administered 2011-08-14: 1 ug via INTRAVENOUS
  Filled 2011-08-14: qty 1

## 2011-08-14 MED ORDER — SODIUM CHLORIDE 0.9 % IJ SOLN: 3.0000 mL | INTRAMUSCULAR | Status: DC | PRN

## 2011-08-14 MED ORDER — ONDANSETRON HCL 4 MG/2ML IJ SOLN: 4.0000 mg | Freq: Four times a day (QID) | INTRAMUSCULAR | Status: DC | PRN

## 2011-08-14 MED ORDER — ENOXAPARIN SODIUM 40 MG/0.4ML ~~LOC~~ SOLN
40.0000 mg | SUBCUTANEOUS | Status: DC
  Filled 2011-08-14: qty 0.4

## 2011-08-14 MED ORDER — DARBEPOETIN ALFA-POLYSORBATE 100 MCG/0.5ML IJ SOLN
100.0000 ug | Freq: Once | INTRAMUSCULAR | Status: DC
  Filled 2011-08-14: qty 0.5

## 2011-08-14 MED ORDER — ACETAMINOPHEN 650 MG RE SUPP: 650.0000 mg | Freq: Four times a day (QID) | RECTAL | Status: DC | PRN

## 2011-08-14 MED ORDER — SODIUM CHLORIDE 0.9 % IV SOLN: 250.0000 mL | INTRAVENOUS | Status: DC | PRN

## 2011-08-14 MED ORDER — ACETAMINOPHEN 325 MG PO TABS: 650.0000 mg | ORAL_TABLET | Freq: Four times a day (QID) | ORAL | Status: DC | PRN

## 2011-08-14 MED ORDER — INSULIN ASPART 100 UNIT/ML ~~LOC~~ SOLN: 0.0000 [IU] | Freq: Three times a day (TID) | SUBCUTANEOUS | Status: DC

## 2011-08-14 MED ORDER — ONDANSETRON HCL 4 MG PO TABS: 4.0000 mg | ORAL_TABLET | Freq: Four times a day (QID) | ORAL | Status: DC | PRN

## 2011-08-14 MED ORDER — PARICALCITOL 5 MCG/ML IV SOLN
1.0000 ug | Freq: Once | INTRAVENOUS | Status: AC
  Administered 2011-08-14: 1 ug via INTRAVENOUS
  Filled 2011-08-14: qty 0.2

## 2011-08-14 MED ORDER — ENOXAPARIN SODIUM 30 MG/0.3ML ~~LOC~~ SOLN
30.0000 mg | SUBCUTANEOUS | Status: DC
  Filled 2011-08-14: qty 0.3

## 2011-08-14 MED ORDER — HYDROMORPHONE HCL PF 1 MG/ML IJ SOLN: 0.5000 mg | INTRAMUSCULAR | Status: DC | PRN

## 2011-08-14 MED ORDER — ENOXAPARIN SODIUM 40 MG/0.4ML ~~LOC~~ SOLN: 40.0000 mg | SUBCUTANEOUS | Status: DC

## 2011-08-14 NOTE — ED Notes (Signed)
Pt provided with beverage per request. 

## 2011-08-14 NOTE — ED Notes (Signed)
Report given to [facility] for Dialysis.

## 2011-08-14 NOTE — ED Notes (Signed)
Spoke to dialysis [facility] with reports pt will not be getting dialysis until after 3 a.m, pt is informed of plan of care with verbal understanding. Pt INAD, ambulatory in room and will continue to monitor pt.

## 2011-08-14 NOTE — ED Notes (Signed)
Transported by Onalee Hua EMT

## 2011-08-14 NOTE — ED Notes (Signed)
Received report from [facility]. Their is no respiratory or cardiac distress. Pt is sleeping at this time. Will continue to monitor.

## 2011-08-14 NOTE — ED Provider Notes (Signed)
History     CSN: [id number]  Arrival date & time 08/13/11  2358   First MD Initiated Contact with Patient 08/14/11 0039      No chief complaint on file.   (Consider location/radiation/quality/duration/timing/severity/associated sxs/prior treatment) The history is provided by the patient and medical records.   patient presents for her routine hemodialysis.  She's been kicked out of all the prior facilities in town and presents the emergency department for routine dialysis.  She last dialyzed Thursday afternoon.  She is without chest pain shortness of breath.  She has no other complaints.  Past Medical History  Diagnosis Date  . Chronic kidney disease     esrd  . Diabetes mellitus   . Hyperlipidemia   . Hypertension   . Renal disorder   . Dialysis care     tues, thurs, sat  . 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 Carotid endarterectomy 2002    left  . Btl   . Rectal fissurectomy   . Av fistula placement 10/30/2010    right brachiocephalic   . Fistulogram     Family History  Problem Relation Age of Onset  . COPD Mother   . Kidney disease Mother   . Heart disease Father   . Lymphoma Son     History  Substance Use Topics  . Smoking status: Current Everyday Smoker -- 1.0 packs/day for 50 years    Types: Cigarettes  . Smokeless tobacco: Never Used  . Alcohol Use: No    OB History    Grav Para Term Preterm Abortions TAB SAB Ect Mult Living                  Review of Systems  All other systems reviewed and are negative.    Allergies  Codeine; Epinephrine; and Percocet  Home Medications   Current Outpatient Rx  Name Route Sig Dispense Refill  . ALLOPURINOL 100 MG PO TABS Oral Take 100 mg by mouth daily.      Marland Kitchen AMLODIPINE BESYLATE 5 MG PO TABS Oral Take 10 mg by mouth daily.    Marland Kitchen CALCIUM ACETATE 667 MG PO CAPS Oral Take 667 mg by mouth 4 (four) times daily.     Marland Kitchen CLONIDINE HCL 0.1 MG PO TABS Oral Take 0.1 mg by mouth  2 (two) times daily.    Marland Kitchen COENZYME Q10 150 MG PO CAPS Oral Take 300 mg by mouth daily.     . COLCHICINE 0.6 MG PO TABS Oral Take 0.6 mg by mouth daily as needed. For gout.    Marland Kitchen DOCUSATE SODIUM 100 MG PO CAPS Oral Take 200 mg by mouth at bedtime.      Marland Kitchen HYDRALAZINE HCL 25 MG PO TABS Oral Take 50 mg by mouth 4 (four) times daily.    . L-METHYLFOLATE-B6-B12 3-35-2 MG PO TABS Oral Take 1 tablet by mouth 2 (two) times daily.     Marland Kitchen LOSARTAN POTASSIUM 50 MG PO TABS Oral Take 50 mg by mouth at bedtime.     Marland Kitchen METOPROLOL SUCCINATE ER 50 MG PO TB24 Oral Take 100 mg by mouth at bedtime.    Marland Kitchen RENA-VITE PO TABS Oral Take 1 tablet by mouth daily.      Marland Kitchen REPAGLINIDE 0.5 MG PO TABS Oral Take 0.5 mg by mouth 3 (three) times daily before meals.        BP 207/55  Pulse 73  Temp(Src) 97.8 F (  36.6 C) (Oral)  Resp 18  SpO2 95%  Physical Exam  Nursing note and vitals reviewed. Constitutional: She is oriented to person, place, and time. She appears well-developed and well-nourished. No distress.  HENT:  Head: Normocephalic and atraumatic.  Eyes: EOM are normal.  Neck: Normal range of motion.  Cardiovascular: Normal rate, regular rhythm and normal heart sounds.   Pulmonary/Chest: Effort normal and breath sounds normal.  Abdominal: Soft. She exhibits no distension. There is no tenderness.  Musculoskeletal: Normal range of motion.  Neurological: She is alert and oriented to person, place, and time.  Skin: Skin is warm and dry.  Psychiatric: She has a normal mood and affect. Judgment normal.    ED Course  Procedures (including critical care time)  Labs Reviewed - No data to display No results found.   No diagnosis found.    MDM  Presents for routine dialysis. No complaints. Will inform [facility] and hospitalist        Lyanne Co, MD 08/14/11 [phone]

## 2011-08-14 NOTE — H&P (Signed)
DATE OF ADMISSION:  08/14/2011  PCP:   [name], MD, MD   Chief Complaint: Here for Dialysis  HPI: Alexandra [name] is an 66 y.o. female with ESRD on Hemodialysis on Mondays, Wednesdays and Fridays who presents for her routine Dialysis treatment.   She is without complaints.    Past Medical History  Diagnosis Date  . Chronic kidney disease     esrd  . Diabetes mellitus   . Hyperlipidemia   . Hypertension   . Renal disorder   . Dialysis care     tues, thurs, sat  . 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 Carotid endarterectomy 2002    left  . Btl   . Rectal fissurectomy   . Av fistula placement 10/30/2010    right brachiocephalic   . Fistulogram     Medications:  HOME MEDS: Prior to Admission medications   Medication Sig Start Date End Date Taking? Authorizing Provider  allopurinol (ZYLOPRIM) 100 MG tablet Take 100 mg by mouth daily.     Yes Historical Provider, MD  amLODipine (NORVASC) 5 MG tablet Take 10 mg by mouth daily. 07/13/11  Yes [name], MD  calcium acetate (PHOSLO) 667 MG capsule Take 667 mg by mouth 4 (four) times daily.    Yes Historical Provider, MD  cloNIDine (CATAPRES) 0.1 MG tablet Take 0.1 mg by mouth 2 (two) times daily. 07/18/11 07/17/12 Yes [name], MD  Coenzyme Q10 150 MG CAPS Take 300 mg by mouth daily.    Yes Historical Provider, MD  colchicine 0.6 MG tablet Take 0.6 mg by mouth daily as needed. For gout.   Yes Historical Provider, MD  docusate sodium (COLACE) 100 MG capsule Take 200 mg by mouth at bedtime.     Yes Historical Provider, MD  hydrALAZINE (APRESOLINE) 25 MG tablet Take 50 mg by mouth 4 (four) times daily. 07/18/11  Yes [name], MD  l-methylfolate-B6-B12 (METANX) 3-35-2 MG TABS Take 1 tablet by mouth 2 (two) times daily.    Yes Historical Provider, MD  losartan (COZAAR) 50 MG tablet Take 50 mg by mouth at bedtime.    Yes Historical Provider, MD  metoprolol succinate  (TOPROL-XL) 50 MG 24 hr tablet Take 100 mg by mouth at bedtime. 07/18/11  Yes Antonieta Pert, MD  multivitamin (RENA-VIT) TABS tablet Take 1 tablet by mouth daily.     Yes Historical Provider, MD  repaglinide (PRANDIN) 0.5 MG tablet Take 0.5 mg by mouth 3 (three) times daily before meals.     Yes Historical Provider, MD    Allergies:  Allergies  Allergen Reactions  . Codeine Other (See Comments)    Passes out  . Epinephrine Other (See Comments)    Tachycardia, diaphoresis, syncope  . Percocet (Oxycodone-Acetaminophen) Other (See Comments)    Passes  out    Social History:   reports that she has been smoking Cigarettes.  She has a 50 pack-year smoking history. She has never used smokeless tobacco. She reports that she does not drink alcohol or use illicit drugs.  Family History: Family History  Problem Relation Age of Onset  . COPD Mother   . Kidney disease Mother   . Heart disease Father   . Lymphoma Son     Review of Systems:  The patient denies anorexia, fever, weight loss, vision loss, decreased hearing, hoarseness, chest pain, syncope, dyspnea on exertion, peripheral edema, balance deficits, hemoptysis, abdominal  pain, melena, hematochezia, severe indigestion/heartburn, hematuria, incontinence, genital sores, muscle weakness, suspicious skin lesions, transient blindness, difficulty walking, depression, unusual weight change, abnormal bleeding, enlarged lymph nodes, angioedema, and breast masses.   Physical Exam:  GEN:  Pleasant 66 year old xamined  and in no acute distress; cooperative with exam Filed Vitals:   08/14/11 0016  BP: 207/55  Pulse: 73  Temp: 97.8 F (36.6 C)  TempSrc: Oral  Resp: 18  SpO2: 95%   Blood pressure 207/55, pulse 73, temperature 97.8 F (36.6 C), temperature source Oral, resp. rate 18, SpO2 95.00%. PSYCH: She is alert and oriented x4; does not appear anxious does not appear depressed; affect is normal HEENT: Normocephalic and Atraumatic,  Mucous membranes pink; PERRLA; EOM intact; Fundi:  Benign;  No scleral icterus, Nares: Patent, Oropharynx: Clear, sparse,  Fair Dentition, Neck:  FROM, no cervical lymphadenopathy nor thyromegaly or carotid bruit; no JVD; Breasts:: Not examined CHEST WALL: No tenderness CHEST: Normal respiration, clear to auscultation bilaterally HEART: Regular rate and rhythm; no murmurs rubs or gallops BACK: No kyphosis or scoliosis; no CVA tenderness ABDOMEN: Positive Bowel Sounds, Scaphoid, soft non-tender; no masses, no organomegaly. Rectal Exam: Not done EXTREMITIES: No bone or joint deformity; age-appropriate arthropathy of the hands and knees; no cyanosis, clubbing or edema; no ulcerations. Genitalia: not examined PULSES: 2+ and symmetric SKIN: Normal hydration no rash or ulceration CNS: Cranial nerves 2-12 grossly intact no focal neurologic deficit   Labs & Imaging Results for orders placed during the hospital encounter of 08/11/11 (from the past 48 hour(s))  GLUCOSE, CAPILLARY     Status: Normal   Collection Time   08/12/11  5:32 AM      Component Value Range Comment   Glucose-Capillary 77  70 - 99 (mg/dL)   CBC     Status: Abnormal   Collection Time   08/12/11 10:39 AM      Component Value Range Comment   WBC 5.3  4.0 - 10.5 (K/uL)    RBC 4.18  3.87 - 5.11 (MIL/uL)    Hemoglobin 10.5 (*) 12.0 - 15.0 (g/dL)    HCT 16.1 (*) 09.6 - 46.0 (%)    MCV 79.7  78.0 - 100.0 (fL)    MCH 25.1 (*) 26.0 - 34.0 (pg)    MCHC 31.5  30.0 - 36.0 (g/dL)    RDW 04.5 (*) 40.9 - 15.5 (%)    Platelets 176  150 - 400 (K/uL)   DIFFERENTIAL     Status: Abnormal   Collection Time   08/12/11 10:39 AM      Component Value Range Comment   Neutrophils Relative 44  43 - 77 (%)    Neutro Abs 2.3  1.7 - 7.7 (K/uL)    Lymphocytes Relative 38  12 - 46 (%)    Lymphs Abs 2.0  0.7 - 4.0 (K/uL)    Monocytes Relative 14 (*) 3 - 12 (%)    Monocytes Absolute 0.7  0.1 - 1.0 (K/uL)    Eosinophils Relative 3  0 - 5 (%)     Eosinophils Absolute 0.2  0.0 - 0.7 (K/uL)    Basophils Relative 1  0 - 1 (%)    Basophils Absolute 0.0  0.0 - 0.1 (K/uL)   RENAL FUNCTION PANEL     Status: Abnormal   Collection Time   08/12/11 10:39 AM      Component Value Range Comment   Sodium 138  135 - 145 (mEq/L)    Potassium 4.2  3.5 - 5.1 (mEq/L)    Chloride 97  96 - 112 (mEq/L)    CO2 26  19 - 32 (mEq/L)    Glucose, Bld 116 (*) 70 - 99 (mg/dL)    BUN 57 (*) 6 - 23 (mg/dL)    Creatinine, Ser 9.60 (*) 0.50 - 1.10 (mg/dL)    Calcium 9.5  8.4 - 10.5 (mg/dL)    Phosphorus 3.0  2.3 - 4.6 (mg/dL)    Albumin 3.2 (*) 3.5 - 5.2 (g/dL)    GFR calc non Af Amer 4 (*) >90 (mL/min)    GFR calc Af Amer 5 (*) >90 (mL/min)    No results found.    Assessment: Present on Admission:  .Hyperparathyroidism, secondary renal .ESRD (end stage renal disease) on dialysis .Hyperlipidemia .Diabetes mellitus .HTN (hypertension), malignant .Anemia of renal disease    Plan:      Admit for 23 hour Observation.   Dialysis Team and Renal Notified by EDP. Medication Reviewed and Reconciled SSI coverage PRN DVT Prophylaxis.   Other plans as per orders.    CODE STATUS:      FULL CODE      Litzi Binning C 08/14/2011, 3:15 AM

## 2011-08-14 NOTE — ED Notes (Signed)
PT. IS HERE FOR HER ROUTINE HEMODIALYSIS . NO OTHER COMPLAINTS . RESPIRATIONS UNLABORED . NO PAIN .

## 2011-08-14 NOTE — Progress Notes (Signed)
Pt had" to be out of hospital as soon as hemo completed"  And would not wait for a DR and always refuses to sign out AMA

## 2011-08-16 ENCOUNTER — Encounter (HOSPITAL_COMMUNITY): Payer: Self-pay | Admitting: *Deleted

## 2011-08-16 ENCOUNTER — Observation Stay (HOSPITAL_COMMUNITY)
Admission: EM | Admit: 2011-08-16 | Discharge: 2011-08-17 | Disposition: A | Discharge status: Home / Self Care | Payer: Medicare HMO | Attending: Emergency Medicine | Admitting: Emergency Medicine

## 2011-08-16 ENCOUNTER — Emergency Department (HOSPITAL_COMMUNITY): Payer: Medicare HMO

## 2011-08-16 DIAGNOSIS — N186 End stage renal disease: Secondary | ICD-10-CM

## 2011-08-16 DIAGNOSIS — E1165 Type 2 diabetes mellitus with hyperglycemia: Secondary | ICD-10-CM

## 2011-08-16 DIAGNOSIS — E1122 Type 2 diabetes mellitus with diabetic chronic kidney disease: Secondary | ICD-10-CM | POA: Diagnosis present

## 2011-08-16 DIAGNOSIS — E119 Type 2 diabetes mellitus without complications: Secondary | ICD-10-CM | POA: Insufficient documentation

## 2011-08-16 DIAGNOSIS — I10 Essential (primary) hypertension: Secondary | ICD-10-CM | POA: Diagnosis present

## 2011-08-16 DIAGNOSIS — E785 Hyperlipidemia, unspecified: Secondary | ICD-10-CM | POA: Insufficient documentation

## 2011-08-16 DIAGNOSIS — I120 Hypertensive chronic kidney disease with stage 5 chronic kidney disease or end stage renal disease: Secondary | ICD-10-CM | POA: Insufficient documentation

## 2011-08-16 DIAGNOSIS — Z992 Dependence on renal dialysis: Principal | ICD-10-CM | POA: Insufficient documentation

## 2011-08-16 DIAGNOSIS — Z8739 Personal history of other diseases of the musculoskeletal system and connective tissue: Secondary | ICD-10-CM

## 2011-08-16 LAB — CBC
MCH: 25 pg — ABNORMAL LOW (ref 26.0–34.0)
MCHC: 32.1 g/dL (ref 30.0–36.0)
MCV: 77.9 fL — ABNORMAL LOW (ref 78.0–100.0)
Platelets: 204 10*3/uL (ref 150–400)
RDW: 19.2 % — ABNORMAL HIGH (ref 11.5–15.5)
WBC: 7.3 10*3/uL (ref 4.0–10.5)

## 2011-08-16 LAB — RENAL FUNCTION PANEL
Calcium: 9.9 mg/dL (ref 8.4–10.5)
Creatinine, Ser: 7.61 mg/dL — ABNORMAL HIGH (ref 0.50–1.10)
GFR calc Af Amer: 6 mL/min — ABNORMAL LOW (ref >90)
GFR calc non Af Amer: 5 mL/min — ABNORMAL LOW (ref >90)
Phosphorus: 2.4 mg/dL (ref 2.3–4.6)
Sodium: 140 mEq/L (ref 135–145)

## 2011-08-16 LAB — DIFFERENTIAL
Basophils Absolute: 0 10*3/uL (ref 0.0–0.1)
Basophils Relative: 0 % (ref 0–1)
Eosinophils Absolute: 0.2 10*3/uL (ref 0.0–0.7)
Eosinophils Relative: 2 % (ref 0–5)

## 2011-08-16 MED ORDER — SODIUM CHLORIDE 0.9 % IJ SOLN: 3.0000 mL | Freq: Two times a day (BID) | INTRAMUSCULAR | Status: DC

## 2011-08-16 MED ORDER — ONDANSETRON HCL 8 MG PO TABS: 4.0000 mg | ORAL_TABLET | Freq: Four times a day (QID) | ORAL | Status: DC | PRN

## 2011-08-16 MED ORDER — HEPARIN SODIUM (PORCINE) 1000 UNIT/ML DIALYSIS
20.0000 [IU]/kg | INTRAMUSCULAR | Status: DC | PRN
  Administered 2011-08-16: 1200 [IU] via INTRAVENOUS_CENTRAL
  Filled 2011-08-16: qty 2

## 2011-08-16 MED ORDER — PARICALCITOL 5 MCG/ML IV SOLN
INTRAVENOUS | Status: AC
  Filled 2011-08-16: qty 1

## 2011-08-16 MED ORDER — ALLOPURINOL 100 MG PO TABS: 100.0000 mg | ORAL_TABLET | Freq: Every day | ORAL | Status: DC

## 2011-08-16 MED ORDER — AMLODIPINE BESYLATE 10 MG PO TABS: 10.0000 mg | ORAL_TABLET | Freq: Every day | ORAL | Status: DC

## 2011-08-16 MED ORDER — HYDRALAZINE HCL 50 MG PO TABS: 50.0000 mg | ORAL_TABLET | Freq: Four times a day (QID) | ORAL | Status: DC

## 2011-08-16 MED ORDER — ACETAMINOPHEN 325 MG PO TABS: 650.0000 mg | ORAL_TABLET | Freq: Four times a day (QID) | ORAL | Status: DC | PRN

## 2011-08-16 MED ORDER — CLONIDINE HCL 0.1 MG PO TABS: 0.1000 mg | ORAL_TABLET | Freq: Two times a day (BID) | ORAL | Status: DC

## 2011-08-16 MED ORDER — COLCHICINE 0.6 MG PO TABS: 0.6000 mg | ORAL_TABLET | Freq: Every day | ORAL | Status: DC | PRN

## 2011-08-16 MED ORDER — PARICALCITOL 5 MCG/ML IV SOLN
1.0000 ug | Freq: Once | INTRAVENOUS | Status: AC
  Administered 2011-08-16: 1 ug via INTRAVENOUS
  Filled 2011-08-16: qty 0.2

## 2011-08-16 MED ORDER — L-METHYLFOLATE-B6-B12 3-35-2 MG PO TABS: 1.0000 | ORAL_TABLET | Freq: Two times a day (BID) | ORAL | Status: DC

## 2011-08-16 MED ORDER — REPAGLINIDE 0.5 MG PO TABS: 0.5000 mg | ORAL_TABLET | Freq: Three times a day (TID) | ORAL | Status: DC

## 2011-08-16 MED ORDER — RENA-VITE PO TABS: 1.0000 | ORAL_TABLET | Freq: Every day | ORAL | Status: DC

## 2011-08-16 MED ORDER — ACETAMINOPHEN 650 MG RE SUPP: 650.0000 mg | Freq: Four times a day (QID) | RECTAL | Status: DC | PRN

## 2011-08-16 MED ORDER — DOCUSATE SODIUM 100 MG PO CAPS: 200.0000 mg | ORAL_CAPSULE | Freq: Every day | ORAL | Status: DC

## 2011-08-16 MED ORDER — CALCIUM ACETATE 667 MG PO CAPS: 667.0000 mg | ORAL_CAPSULE | Freq: Four times a day (QID) | ORAL | Status: DC

## 2011-08-16 MED ORDER — METOPROLOL SUCCINATE ER 100 MG PO TB24: 100.0000 mg | ORAL_TABLET | Freq: Every day | ORAL | Status: DC

## 2011-08-16 MED ORDER — ONDANSETRON HCL 4 MG/2ML IJ SOLN: 4.0000 mg | Freq: Four times a day (QID) | INTRAMUSCULAR | Status: DC | PRN

## 2011-08-16 MED ORDER — LOSARTAN POTASSIUM 50 MG PO TABS: 50.0000 mg | ORAL_TABLET | Freq: Every day | ORAL | Status: DC

## 2011-08-16 NOTE — H&P (Signed)
Alexandra [name] is an 66 y.o. female.   Nephrologist - Dr.Frazier. Chief Complaint: Has come for routine dialysis. HPI: 66 year-old female with known history of ESRD on hemodialysis, hypertension, diabetes mellitus2 has come for routine dialysis. Patient denies any chest pain, shortness of breath, nausea vomiting or diarrhea.  Past Medical History  Diagnosis Date  . Chronic kidney disease     esrd  . Diabetes mellitus   . Hyperlipidemia   . Hypertension   . Renal disorder   . Dialysis care     tues, thurs, sat  . 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 Carotid endarterectomy 2002    left  . Btl   . Rectal fissurectomy   . Av fistula placement 10/30/2010    right brachiocephalic   . Fistulogram     Family History  Problem Relation Age of Onset  . COPD Mother   . Kidney disease Mother   . Heart disease Father   . Lymphoma Son    Social History:  reports that she has been smoking Cigarettes.  She has a 50 pack-year smoking history. She has never used smokeless tobacco. She reports that she does not drink alcohol or use illicit drugs.  Allergies:  Allergies  Allergen Reactions  . Codeine Other (See Comments)    Passes out  . Epinephrine Other (See Comments)    Tachycardia, diaphoresis, syncope  . Percocet (Oxycodone-Acetaminophen) Other (See Comments)    Passes  out     (Not in a hospital admission)  Results for orders placed during the hospital encounter of 08/16/11 (from the past 48 hour(s))  CBC     Status: Abnormal   Collection Time   08/16/11  9:56 PM      Component Value Range Comment   WBC 7.3  4.0 - 10.5 (K/uL)    RBC 4.52  3.87 - 5.11 (MIL/uL)    Hemoglobin 11.3 (*) 12.0 - 15.0 (g/dL)    HCT 16.1 (*) 09.6 - 46.0 (%)    MCV 77.9 (*) 78.0 - 100.0 (fL)    MCH 25.0 (*) 26.0 - 34.0 (pg)    MCHC 32.1  30.0 - 36.0 (g/dL)    RDW 04.5 (*) 40.9 - 15.5 (%)    Platelets 204  150 - 400 (K/uL)   DIFFERENTIAL     Status:  Normal   Collection Time   08/16/11  9:56 PM      Component Value Range Comment   Neutrophils Relative 55  43 - 77 (%)    Neutro Abs 4.0  1.7 - 7.7 (K/uL)    Lymphocytes Relative 31  12 - 46 (%)    Lymphs Abs 2.3  0.7 - 4.0 (K/uL)    Monocytes Relative 12  3 - 12 (%)    Monocytes Absolute 0.9  0.1 - 1.0 (K/uL)    Eosinophils Relative 2  0 - 5 (%)    Eosinophils Absolute 0.2  0.0 - 0.7 (K/uL)    Basophils Relative 0  0 - 1 (%)    Basophils Absolute 0.0  0.0 - 0.1 (K/uL)   RENAL FUNCTION PANEL     Status: Abnormal   Collection Time   08/16/11  9:56 PM      Component Value Range Comment   Sodium 140  135 - 145 (mEq/L)    Potassium 5.0  3.5 - 5.1 (mEq/L)    Chloride 99  96 - 112 (mEq/L)  CO2 27  19 - 32 (mEq/L)    Glucose, Bld 182 (*) 70 - 99 (mg/dL)    BUN 51 (*) 6 - 23 (mg/dL)    Creatinine, Ser 1.61 (*) 0.50 - 1.10 (mg/dL)    Calcium 9.9  8.4 - 10.5 (mg/dL)    Phosphorus 2.4  2.3 - 4.6 (mg/dL)    Albumin 3.6  3.5 - 5.2 (g/dL)    GFR calc non Af Amer 5 (*) >90 (mL/min)    GFR calc Af Amer 6 (*) >90 (mL/min)    No results found.  Review of Systems  Constitutional: Negative.   HENT: Negative.   Eyes: Negative.   Respiratory: Negative.   Cardiovascular: Negative.   Gastrointestinal: Negative.   Genitourinary: Negative.   Musculoskeletal: Negative.   Skin: Negative.   Neurological: Negative.   Endo/Heme/Allergies: Negative.   Psychiatric/Behavioral: Negative.     Blood pressure 212/92, pulse 79, temperature 98.1 F (36.7 C), temperature source Oral, resp. rate 20, weight 59.35 kg (130 lb 13.5 oz), SpO2 94.00%. Physical Exam  Constitutional: She is oriented to person, place, and time. She appears well-developed and well-nourished. No distress.  HENT:  Head: Normocephalic and atraumatic.  Right Ear: External ear normal.  Left Ear: External ear normal.  Nose: Nose normal.  Mouth/Throat: Oropharynx is clear and moist. No oropharyngeal exudate.  Eyes: Conjunctivae are  normal. Pupils are equal, round, and reactive to light. Right eye exhibits no discharge. Left eye exhibits no discharge. No scleral icterus.  Neck: Normal range of motion. Neck supple.  Cardiovascular: Normal rate and regular rhythm.   Respiratory: Effort normal and breath sounds normal. No respiratory distress. She has no wheezes. She has no rales.  GI: Soft. Bowel sounds are normal. She exhibits no distension. There is no tenderness. There is no rebound.  Musculoskeletal: Normal range of motion. She exhibits no edema and no tenderness.  Neurological: She is alert and oriented to person, place, and time.       Moves all extremities.  Skin: Skin is warm and dry. She is not diaphoretic.  Psychiatric: Her behavior is normal.     Assessment/Plan #1. ESRD on hemodialysis - dialysis per nephrologist. #2. Hypertension, diabetes mellitus2, history of gout - continue home medications.  CODE STATUS - full code.  Eduard Clos 08/16/2011, 10:39 PM

## 2011-08-16 NOTE — ED Notes (Addendum)
ED CN spoke with [facility] HD RN. "HD has HD orders and is ready to dialyze pt". Spoke with EDP and EDP is "OK with pt going to HD, nothing needing to be done in ED from their standoint", "EDP has paged hospitalists, but have not spoken with them". CN sent pt to HD for hospitalists to see pt in HD. Pt up to HD in w/c with RN. CN spoke with pt re: current plan, pt agreeable with plan, pt frustrated and angry.

## 2011-08-16 NOTE — ED Notes (Signed)
NO PERIPHERAL IV AT ARRIVAL.

## 2011-08-16 NOTE — ED Notes (Signed)
PT. IS HERE FOR HER ROUTINE HEMODIALYSIS , DENIES PAIN , RESPIRATIONS UNLABORED , AMBULATORY.

## 2011-08-16 NOTE — ED Notes (Signed)
PT. UPSET WITH NURSE , STATES SHE HAS WAITED A LONG TIME " YOU HAVE WASTED MY TIME AND I NEED MY DIALYSIS RIGHT NOW" ,  NURSE EXPLAINED DELAY AND PROCESS , CHARGE NURSE SPOKE WITH PT.

## 2011-08-16 NOTE — ED Notes (Signed)
DR. Ola Spurr AT BEDSIDE.
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
## 2011-08-16 NOTE — ED Provider Notes (Signed)
History    the patient is a 66 year old female who presents with a request for hemodialysis. Onset today. Duration is constant. Patient denies any complaints or associated symptoms.  CSN: [id number]  Arrival date & time 08/16/11  1941   None     Chief Complaint  Patient presents with  . Vascular Access Problem    (Consider location/radiation/quality/duration/timing/severity/associated sxs/prior treatment) The history is provided by the patient.    Past Medical History  Diagnosis Date  . Chronic kidney disease     esrd  . Diabetes mellitus   . Hyperlipidemia   . Hypertension   . Renal disorder   . Dialysis care     tues, thurs, sat  . 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 Carotid endarterectomy 2002    left  . Btl   . Rectal fissurectomy   . Av fistula placement 10/30/2010    right brachiocephalic   . Fistulogram     Family History  Problem Relation Age of Onset  . COPD Mother   . Kidney disease Mother   . Heart disease Father   . Lymphoma Son     History  Substance Use Topics  . Smoking status: Current Everyday Smoker -- 1.0 packs/day for 50 years    Types: Cigarettes  . Smokeless tobacco: Never Used  . Alcohol Use: No    OB History    Grav Para Term Preterm Abortions TAB SAB Ect Mult Living                  Review of Systems  Constitutional: Negative for fever and fatigue.  HENT: Negative for congestion, drooling and neck pain.   Eyes: Negative for pain.  Respiratory: Negative for cough and shortness of breath.   Cardiovascular: Negative for chest pain.  Gastrointestinal: Negative for nausea, vomiting, abdominal pain and diarrhea.  Genitourinary: Negative for dysuria and hematuria.  Musculoskeletal: Negative for back pain and gait problem.  Skin: Negative for color change.  Neurological: Negative for dizziness and headaches.  Hematological: Negative for adenopathy.  Psychiatric/Behavioral: Negative for  behavioral problems.  All other systems reviewed and are negative.    Allergies  Codeine; Epinephrine; and Percocet  Home Medications   Current Outpatient Rx  Name Route Sig Dispense Refill  . ALLOPURINOL 100 MG PO TABS Oral Take 100 mg by mouth daily.      Marland Kitchen AMLODIPINE BESYLATE 5 MG PO TABS Oral Take 10 mg by mouth daily.    Marland Kitchen CALCIUM ACETATE 667 MG PO CAPS Oral Take 667 mg by mouth 4 (four) times daily.     Marland Kitchen CLONIDINE HCL 0.1 MG PO TABS Oral Take 0.1 mg by mouth 2 (two) times daily.    Marland Kitchen COENZYME Q10 150 MG PO CAPS Oral Take 300 mg by mouth daily.     . COLCHICINE 0.6 MG PO TABS Oral Take 0.6 mg by mouth daily as needed. For gout.    Marland Kitchen DOCUSATE SODIUM 100 MG PO CAPS Oral Take 200 mg by mouth at bedtime.      Marland Kitchen HYDRALAZINE HCL 25 MG PO TABS Oral Take 50 mg by mouth 4 (four) times daily.    . L-METHYLFOLATE-B6-B12 3-35-2 MG PO TABS Oral Take 1 tablet by mouth 2 (two) times daily.     Marland Kitchen LOSARTAN POTASSIUM 50 MG PO TABS Oral Take 50 mg by mouth at bedtime.     Marland Kitchen METOPROLOL SUCCINATE ER 50 MG PO  TB24 Oral Take 100 mg by mouth at bedtime.    Marland Kitchen RENA-VITE PO TABS Oral Take 1 tablet by mouth daily.      Marland Kitchen REPAGLINIDE 0.5 MG PO TABS Oral Take 0.5 mg by mouth 3 (three) times daily before meals.        BP 159/76  Pulse 89  Temp 97.4 F (36.3 C)  Resp 20  SpO2 94%  Physical Exam  Nursing note and vitals reviewed. Constitutional: She is oriented to person, place, and time. She appears well-developed and well-nourished.  HENT:  Head: Normocephalic.  Mouth/Throat: No oropharyngeal exudate.  Eyes: Conjunctivae and EOM are normal. Pupils are equal, round, and reactive to light.  Neck: Normal range of motion. Neck supple.  Cardiovascular: Normal rate, regular rhythm, normal heart sounds and intact distal pulses.  Exam reveals no gallop and no friction rub.   No murmur heard. Pulmonary/Chest: Effort normal and breath sounds normal. No respiratory distress. She has no wheezes.  Abdominal:  Soft. Bowel sounds are normal. There is no tenderness. There is no rebound and no guarding.  Musculoskeletal: Normal range of motion. She exhibits no edema and no tenderness.  Neurological: She is alert and oriented to person, place, and time.  Skin: Skin is warm and dry.  Psychiatric: She has a normal mood and affect. Her behavior is normal.    ED Course  Procedures (including critical care time)  Labs Reviewed - No data to display No results found.   No diagnosis found.    MDM  8:53 PM 66 y.o. female w hx of DM, CKD on HD pw request for dialysis. Pt has no complaints at this time. She is AFVSS here, appears well on exam. Hospitalist consulted, pt will go to dialysis per Dr. Cindra Presume orders.   Clinical Impression 1. Dialysis patient          Purvis Sheffield, MD 08/16/11 [phone]

## 2011-08-16 NOTE — ED Notes (Signed)
Patient her for her dialysis treatment

## 2011-08-17 NOTE — ED Provider Notes (Signed)
I have seen and examined this patient with the resident.  I agree with the resident's note, assessment and plan except as indicated.    Patient presented for her normal dialysis.  Patient refused other labs and denied any other complaints today.  Nat Christen, MD 08/17/11 [phone]

## 2011-08-19 ENCOUNTER — Encounter (HOSPITAL_COMMUNITY): Payer: Self-pay | Admitting: *Deleted

## 2011-08-19 ENCOUNTER — Observation Stay (HOSPITAL_COMMUNITY)
Admission: EM | Admit: 2011-08-19 | Discharge: 2011-08-19 | Disposition: A | Discharge status: Home / Self Care | Payer: Medicare HMO | Attending: Emergency Medicine | Admitting: Emergency Medicine

## 2011-08-19 DIAGNOSIS — E119 Type 2 diabetes mellitus without complications: Secondary | ICD-10-CM | POA: Insufficient documentation

## 2011-08-19 DIAGNOSIS — E1165 Type 2 diabetes mellitus with hyperglycemia: Secondary | ICD-10-CM

## 2011-08-19 DIAGNOSIS — I120 Hypertensive chronic kidney disease with stage 5 chronic kidney disease or end stage renal disease: Secondary | ICD-10-CM | POA: Insufficient documentation

## 2011-08-19 DIAGNOSIS — I10 Essential (primary) hypertension: Secondary | ICD-10-CM

## 2011-08-19 DIAGNOSIS — N186 End stage renal disease: Secondary | ICD-10-CM | POA: Insufficient documentation

## 2011-08-19 DIAGNOSIS — Z992 Dependence on renal dialysis: Secondary | ICD-10-CM | POA: Diagnosis present

## 2011-08-19 DIAGNOSIS — E1122 Type 2 diabetes mellitus with diabetic chronic kidney disease: Secondary | ICD-10-CM | POA: Diagnosis present

## 2011-08-19 DIAGNOSIS — E785 Hyperlipidemia, unspecified: Secondary | ICD-10-CM | POA: Insufficient documentation

## 2011-08-19 LAB — RENAL FUNCTION PANEL
Albumin: 3.4 g/dL — ABNORMAL LOW (ref 3.5–5.2)
BUN: 62 mg/dL — ABNORMAL HIGH (ref 6–23)
CO2: 27 meq/L (ref 19–32)
Calcium: 9.6 mg/dL (ref 8.4–10.5)
Chloride: 97 meq/L (ref 96–112)
Creatinine, Ser: 8.18 mg/dL — ABNORMAL HIGH (ref 0.50–1.10)
GFR calc Af Amer: 5 mL/min — ABNORMAL LOW
GFR calc non Af Amer: 4 mL/min — ABNORMAL LOW
Glucose, Bld: 102 mg/dL — ABNORMAL HIGH (ref 70–99)
Phosphorus: 3.1 mg/dL (ref 2.3–4.6)
Potassium: 5 meq/L (ref 3.5–5.1)
Sodium: 137 meq/L (ref 135–145)

## 2011-08-19 LAB — DIFFERENTIAL
Eosinophils Absolute: 0.2 10*3/uL (ref 0.0–0.7)
Eosinophils Relative: 2 % (ref 0–5)
Lymphocytes Relative: 35 % (ref 12–46)
Lymphs Abs: 2.7 10*3/uL (ref 0.7–4.0)
Monocytes Absolute: 0.9 10*3/uL (ref 0.1–1.0)

## 2011-08-19 LAB — CBC
HCT: 33.5 % — ABNORMAL LOW (ref 36.0–46.0)
MCH: 24.9 pg — ABNORMAL LOW (ref 26.0–34.0)
MCV: 77.9 fL — ABNORMAL LOW (ref 78.0–100.0)
Platelets: 164 10*3/uL (ref 150–400)
RBC: 4.3 MIL/uL (ref 3.87–5.11)
WBC: 7.9 10*3/uL (ref 4.0–10.5)

## 2011-08-19 MED ORDER — PARICALCITOL 5 MCG/ML IV SOLN
INTRAVENOUS | Status: AC
  Administered 2011-08-19: 2 ug via INTRAVENOUS
  Filled 2011-08-19: qty 1

## 2011-08-19 MED ORDER — DARBEPOETIN ALFA-POLYSORBATE 100 MCG/0.5ML IJ SOLN
INTRAMUSCULAR | Status: AC
  Administered 2011-08-19: 100 ug via INTRAVENOUS
  Filled 2011-08-19: qty 0.5

## 2011-08-19 MED ORDER — DARBEPOETIN ALFA-POLYSORBATE 100 MCG/0.5ML IJ SOLN
100.0000 ug | Freq: Once | INTRAMUSCULAR | Status: AC
  Administered 2011-08-19: 100 ug via INTRAVENOUS

## 2011-08-19 MED ORDER — PARICALCITOL 5 MCG/ML IV SOLN
2.0000 ug | Freq: Once | INTRAVENOUS | Status: AC
  Administered 2011-08-19: 2 ug via INTRAVENOUS

## 2011-08-19 NOTE — H&P (Signed)
Alexandra [name] is an 66 y.o. female.   Nephrologist - Dr.Frazier.  Chief Complaint: Has come for dialysis. HPI: 66 year-old female with known history of ESRD on hemodialysis has come for her regular dialysis. Denies any chest pain shortness of breath.  Past Medical History  Diagnosis Date  . Chronic kidney disease     esrd  . Diabetes mellitus   . Hyperlipidemia   . Hypertension   . Renal disorder   . Dialysis care     tues, thurs, sat  . 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 Carotid endarterectomy 2002    left  . Btl   . Rectal fissurectomy   . Av fistula placement 10/30/2010    right brachiocephalic   . Fistulogram     Family History  Problem Relation Age of Onset  . COPD Mother   . Kidney disease Mother   . Heart disease Father   . Lymphoma Son    Social History:  reports that she has been smoking Cigarettes.  She has a 50 pack-year smoking history. She has never used smokeless tobacco. She reports that she does not drink alcohol or use illicit drugs.  Allergies:  Allergies  Allergen Reactions  . Codeine Other (See Comments)    Passes out  . Epinephrine Other (See Comments)    Tachycardia, diaphoresis, syncope  . Percocet (Oxycodone-Acetaminophen) Other (See Comments)    Passes  out     (Not in a hospital admission)  No results found for this or any previous visit (from the past 48 hour(s)). No results found.  Review of Systems  Constitutional: Negative.   HENT: Negative.   Eyes: Negative.   Respiratory: Negative.   Cardiovascular: Negative.   Gastrointestinal: Negative.   Genitourinary: Negative.   Musculoskeletal: Negative.   Skin: Negative.   Neurological: Negative.   Endo/Heme/Allergies: Negative.   Psychiatric/Behavioral: Negative.     Blood pressure 205/64, pulse 77, temperature 98.3 F (36.8 C), temperature source Oral, resp. rate 18, SpO2 98.00%. Physical Exam  Constitutional: She is oriented  to person, place, and time. She appears well-developed and well-nourished. No distress.  HENT:  Head: Normocephalic and atraumatic.  Right Ear: External ear normal.  Left Ear: External ear normal.  Nose: Nose normal.  Mouth/Throat: Oropharynx is clear and moist. No oropharyngeal exudate.  Eyes: Conjunctivae are normal. Pupils are equal, round, and reactive to light. Right eye exhibits no discharge. Left eye exhibits no discharge. No scleral icterus.  Neck: Normal range of motion. Neck supple.  Cardiovascular: Normal rate and regular rhythm.   Respiratory: Effort normal and breath sounds normal. No respiratory distress. She has no wheezes. She has no rales.  GI: Soft. Bowel sounds are normal. She exhibits no distension. There is no tenderness. There is no rebound.  Musculoskeletal: Normal range of motion. She exhibits no edema and no tenderness.  Neurological: She is alert and oriented to person, place, and time.       Moves all extremities.  Skin: Skin is warm and dry. She is not diaphoretic.  Psychiatric: Her behavior is normal.     Assessment/Plan #1. ESRD on hemodialysis  - dialysis per nephrologist.  #2. Diabetes mellitus2 - continue home medications. #3. Hypertension - continue home medications.  CODE STATUS - full code.  Alanea Woolridge N. 08/19/2011, 1:08 AM

## 2011-08-19 NOTE — ED Notes (Addendum)
Here for routine HD, no complaints, EDNP and hospitalists contacted. Pending arrival of admitting MD for admission orders. Pt alert, NAD, calm, interactive, skin W&D, resps e/u, speaking in clear complete sentences.

## 2011-08-19 NOTE — ED Provider Notes (Signed)
Medical screening examination/treatment/procedure(s) were performed by non-physician practitioner and as supervising physician I was immediately available for consultation/collaboration.  Olivia Mackie, MD 08/19/11 [phone]

## 2011-08-19 NOTE — ED Notes (Signed)
Pt here for routine dialysis w/o any complaints at present.

## 2011-08-19 NOTE — ED Notes (Signed)
Pt to HD via w/c with EMT, no changes. HD RN notified.

## 2011-08-19 NOTE — ED Notes (Signed)
ED CN spoke with Alexandra [name], HD RN. Pt to be seen by [facility] hospitalists in the ED and have orders placed for admission prior to going to leaving ED to go to HD.

## 2011-08-19 NOTE — ED Provider Notes (Signed)
History     CSN: [id number]  Arrival date & time 08/19/11  0005   First MD Initiated Contact with Patient 08/19/11 0042      No chief complaint on file.   (Consider location/radiation/quality/duration/timing/severity/associated sxs/prior treatment) HPI Comments: Patient is here for her dialysis, she was returned to report.  Late at night, so that she can be dialyzed before the regular dialysis.  People are arrive  The history is provided by the patient.    Past Medical History  Diagnosis Date  . Chronic kidney disease     esrd  . Diabetes mellitus   . Hyperlipidemia   . Hypertension   . Renal disorder   . Dialysis care     tues, thurs, sat  . 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 Carotid endarterectomy 2002    left  . Btl   . Rectal fissurectomy   . Av fistula placement 10/30/2010    right brachiocephalic   . Fistulogram     Family History  Problem Relation Age of Onset  . COPD Mother   . Kidney disease Mother   . Heart disease Father   . Lymphoma Son     History  Substance Use Topics  . Smoking status: Current Everyday Smoker -- 1.0 packs/day for 50 years    Types: Cigarettes  . Smokeless tobacco: Never Used  . Alcohol Use: No    OB History    Grav Para Term Preterm Abortions TAB SAB Ect Mult Living                  Review of Systems  Constitutional: Negative for fever.  Gastrointestinal: Negative for nausea.  Neurological: Negative for dizziness and headaches.    Allergies  Codeine; Epinephrine; and Percocet  Home Medications   Current Outpatient Rx  Name Route Sig Dispense Refill  . ALLOPURINOL 100 MG PO TABS Oral Take 100 mg by mouth daily.      Marland Kitchen AMLODIPINE BESYLATE 5 MG PO TABS Oral Take 10 mg by mouth daily.    Marland Kitchen CALCIUM ACETATE 667 MG PO CAPS Oral Take 667 mg by mouth 4 (four) times daily.     Marland Kitchen CLONIDINE HCL 0.1 MG PO TABS Oral Take 0.1 mg by mouth 2 (two) times daily.    Marland Kitchen COENZYME Q10 150 MG  PO CAPS Oral Take 300 mg by mouth daily.     . COLCHICINE 0.6 MG PO TABS Oral Take 0.6 mg by mouth daily as needed. For gout.    Marland Kitchen DOCUSATE SODIUM 100 MG PO CAPS Oral Take 200 mg by mouth at bedtime.      Marland Kitchen HYDRALAZINE HCL 25 MG PO TABS Oral Take 50 mg by mouth 4 (four) times daily.    . L-METHYLFOLATE-B6-B12 3-35-2 MG PO TABS Oral Take 1 tablet by mouth 2 (two) times daily.     Marland Kitchen LOSARTAN POTASSIUM 50 MG PO TABS Oral Take 50 mg by mouth at bedtime.     Marland Kitchen METOPROLOL SUCCINATE ER 50 MG PO TB24 Oral Take 100 mg by mouth at bedtime.    Marland Kitchen RENA-VITE PO TABS Oral Take 1 tablet by mouth daily.      Marland Kitchen REPAGLINIDE 0.5 MG PO TABS Oral Take 0.5 mg by mouth 3 (three) times daily before meals.        BP 205/64  Pulse 77  Temp(Src) 98.3 F (36.8 C) (Oral)  Resp 18  SpO2 98%  Physical Exam  Constitutional: She appears well-developed and well-nourished.  HENT:  Head: Normocephalic.  Cardiovascular: Normal rate.        Hypertension  Pulmonary/Chest: Effort normal.    ED Course  Procedures (including critical care time)  Labs Reviewed - No data to display No results found.   No diagnosis found.    MDM   5 hospice called for admission.  Orders, dialysis center is ready for the patient        Arman Filter, NP 08/19/11 0045

## 2011-08-20 ENCOUNTER — Encounter: Payer: Self-pay | Admitting: Neurosurgery

## 2011-08-20 ENCOUNTER — Observation Stay (HOSPITAL_COMMUNITY)
Admission: EM | Admit: 2011-08-20 | Discharge: 2011-08-21 | Disposition: A | Discharge status: Home / Self Care | Payer: Medicare HMO | Attending: Internal Medicine | Admitting: Internal Medicine

## 2011-08-20 ENCOUNTER — Encounter (HOSPITAL_COMMUNITY): Payer: Self-pay | Admitting: *Deleted

## 2011-08-20 ENCOUNTER — Other Ambulatory Visit: Payer: Self-pay | Admitting: Internal Medicine

## 2011-08-20 DIAGNOSIS — I10 Essential (primary) hypertension: Secondary | ICD-10-CM | POA: Diagnosis present

## 2011-08-20 DIAGNOSIS — I120 Hypertensive chronic kidney disease with stage 5 chronic kidney disease or end stage renal disease: Secondary | ICD-10-CM | POA: Insufficient documentation

## 2011-08-20 DIAGNOSIS — E119 Type 2 diabetes mellitus without complications: Secondary | ICD-10-CM

## 2011-08-20 DIAGNOSIS — E1122 Type 2 diabetes mellitus with diabetic chronic kidney disease: Secondary | ICD-10-CM | POA: Diagnosis present

## 2011-08-20 DIAGNOSIS — N186 End stage renal disease: Secondary | ICD-10-CM

## 2011-08-20 DIAGNOSIS — E785 Hyperlipidemia, unspecified: Secondary | ICD-10-CM | POA: Diagnosis present

## 2011-08-20 DIAGNOSIS — Z992 Dependence on renal dialysis: Principal | ICD-10-CM | POA: Insufficient documentation

## 2011-08-20 MED ORDER — HEPARIN SODIUM (PORCINE) 1000 UNIT/ML DIALYSIS
1100.0000 [IU] | INTRAMUSCULAR | Status: DC | PRN
  Filled 2011-08-20: qty 2

## 2011-08-20 NOTE — ED Notes (Signed)
Called dialysis and updated on patient, dialysis is ready for patient.  Spoke with [name], RN

## 2011-08-20 NOTE — H&P (Signed)
Alexandra [name] is an 66 y.o. female.   Chief Complaint: Need for hemodialysis HPI: Patient is a 66 year old female with known history of  end-stage renal disease on hemodialysis 3 times a week who is here for hemodialysis today. She has had vascular access problems in the past. The patient has not had any outpatient dialysis. She has been coming on a regular basis. Dr. Bascom Levels has already seen the patient and ordered some dialysis. She has other medical problems including hypertension diabetes and hyperlipidemia of which have been stable  Past Medical History  Diagnosis Date  . Chronic kidney disease     esrd  . Diabetes mellitus   . Hyperlipidemia   . Hypertension   . Renal disorder   . Dialysis care     tues, thurs, sat  . 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 Carotid endarterectomy 2002    left  . Btl   . Rectal fissurectomy   . Av fistula placement 10/30/2010    right brachiocephalic   . Fistulogram     Family History  Problem Relation Age of Onset  . COPD Mother   . Kidney disease Mother   . Heart disease Father   . Lymphoma Son    Social History:  reports that she has been smoking Cigarettes.  She has a 50 pack-year smoking history. She has never used smokeless tobacco. She reports that she does not drink alcohol or use illicit drugs.  Allergies:  Allergies  Allergen Reactions  . Codeine Other (See Comments)    Passes out  . Epinephrine Other (See Comments)    Tachycardia, diaphoresis, syncope  . Percocet (Oxycodone-Acetaminophen) Other (See Comments)    Passes  out     (Not in a hospital admission)  Results for orders placed during the hospital encounter of 08/19/11 (from the past 48 hour(s))  CBC     Status: Abnormal   Collection Time   08/19/11  1:50 AM      Component Value Range Comment   WBC 7.9  4.0 - 10.5 (K/uL)    RBC 4.30  3.87 - 5.11 (MIL/uL)    Hemoglobin 10.7 (*) 12.0 - 15.0 (g/dL)    HCT 16.1 (*)  09.6 - 46.0 (%)    MCV 77.9 (*) 78.0 - 100.0 (fL)    MCH 24.9 (*) 26.0 - 34.0 (pg)    MCHC 31.9  30.0 - 36.0 (g/dL)    RDW 04.5 (*) 40.9 - 15.5 (%)    Platelets 164  150 - 400 (K/uL)   DIFFERENTIAL     Status: Normal   Collection Time   08/19/11  1:50 AM      Component Value Range Comment   Neutrophils Relative 51  43 - 77 (%)    Neutro Abs 4.0  1.7 - 7.7 (K/uL)    Lymphocytes Relative 35  12 - 46 (%)    Lymphs Abs 2.7  0.7 - 4.0 (K/uL)    Monocytes Relative 12  3 - 12 (%)    Monocytes Absolute 0.9  0.1 - 1.0 (K/uL)    Eosinophils Relative 2  0 - 5 (%)    Eosinophils Absolute 0.2  0.0 - 0.7 (K/uL)    Basophils Relative 0  0 - 1 (%)    Basophils Absolute 0.0  0.0 - 0.1 (K/uL)   RENAL FUNCTION PANEL     Status: Abnormal   Collection Time   08/19/11  1:50 AM      Component Value Range Comment   Sodium 137  135 - 145 (mEq/L)    Potassium 5.0  3.5 - 5.1 (mEq/L)    Chloride 97  96 - 112 (mEq/L)    CO2 27  19 - 32 (mEq/L)    Glucose, Bld 102 (*) 70 - 99 (mg/dL)    BUN 62 (*) 6 - 23 (mg/dL)    Creatinine, Ser 1.47 (*) 0.50 - 1.10 (mg/dL)    Calcium 9.6  8.4 - 10.5 (mg/dL)    Phosphorus 3.1  2.3 - 4.6 (mg/dL)    Albumin 3.4 (*) 3.5 - 5.2 (g/dL)    GFR calc non Af Amer 4 (*) >90 (mL/min)    GFR calc Af Amer 5 (*) >90 (mL/min)    No results found.  Review of Systems  Constitutional: Negative.   HENT: Negative.   Eyes: Negative.   Respiratory: Negative.   Cardiovascular: Negative.   Gastrointestinal: Negative.   Genitourinary: Negative.   Musculoskeletal: Negative.   Skin: Negative.   Neurological: Negative.   Endo/Heme/Allergies: Negative.   Psychiatric/Behavioral: Negative.     Blood pressure 198/66, pulse 76, temperature 98.1 F (36.7 C), temperature source Oral, resp. rate 16, SpO2 100.00%. Physical Exam  Constitutional: She is oriented to person, place, and time. She appears well-developed and well-nourished.  HENT:  Head: Normocephalic and atraumatic.  Right Ear:  External ear normal.  Left Ear: External ear normal.  Nose: Nose normal.  Mouth/Throat: Oropharynx is clear and moist.  Eyes: Conjunctivae and EOM are normal. Pupils are equal, round, and reactive to light.  Neck: Normal range of motion. Neck supple.  Cardiovascular: Normal rate, regular rhythm, normal heart sounds and intact distal pulses.   Respiratory: Effort normal and breath sounds normal.  GI: Soft. Bowel sounds are normal.  Musculoskeletal: Normal range of motion.  Neurological: She is alert and oriented to person, place, and time. She has normal reflexes.  Skin: Skin is warm and dry.  Psychiatric: She has a normal mood and affect. Her behavior is normal. Judgment and thought content normal.     Assessment/Plan 66 year old female with known history of end-stage renal disease here for her hemodialysis. Plan #1 end-stage renal disease: Patient will be admitted for observation she will go straight to her hemodialysis and once it is done, she will be discharged home. #2 diabetes type 2: Continue home medications #3 hypertension: No change in medications. #4 hyperlipidemia: Again no change in medications at this point.  Alexandra [name],LAWAL 08/20/2011, 9:55 PM

## 2011-08-20 NOTE — Consult Note (Signed)
  Pt. In ER pt. Has lost wt and her BP is up. Dialysis orders are written.

## 2011-08-20 NOTE — ED Notes (Signed)
Patient is here for dialysis.

## 2011-08-20 NOTE — ED Provider Notes (Signed)
History     CSN: [id number]  Arrival date & time 08/20/11  7829   First MD Initiated Contact with Patient 08/20/11 2059      Chief Complaint  Patient presents with  . Vascular Access Problem    HPI  History provided by the patient. Patient is a 66 year old female with history of hypertension, hyperlipidemia, with end-stage renal disease on hemodialysis presenting with requests for dialysis today. Patient comes to the emergency room to receive dialysis. Patient denies any other complaints. She denies any shortness of breath or increased swelling. She denies any fevers. Patient states she feels well and is just requesting dialysis today.    Past Medical History  Diagnosis Date  . Chronic kidney disease     esrd  . Diabetes mellitus   . Hyperlipidemia   . Hypertension   . Renal disorder   . Dialysis care     tues, thurs, sat  . 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 Carotid endarterectomy 2002    left  . Btl   . Rectal fissurectomy   . Av fistula placement 10/30/2010    right brachiocephalic   . Fistulogram     Family History  Problem Relation Age of Onset  . COPD Mother   . Kidney disease Mother   . Heart disease Father   . Lymphoma Son     History  Substance Use Topics  . Smoking status: Current Everyday Smoker -- 1.0 packs/day for 50 years    Types: Cigarettes  . Smokeless tobacco: Never Used  . Alcohol Use: No    OB History    Grav Para Term Preterm Abortions TAB SAB Ect Mult Living                  Review of Systems  Constitutional: Negative for fever.  Respiratory: Negative for shortness of breath.   Cardiovascular: Negative for chest pain.  Gastrointestinal: Negative for nausea, vomiting and abdominal pain.    Allergies  Codeine; Epinephrine; and Percocet  Home Medications   Current Outpatient Rx  Name Route Sig Dispense Refill  . ALLOPURINOL 100 MG PO TABS Oral Take 100 mg by mouth daily.      Marland Kitchen  AMLODIPINE BESYLATE 5 MG PO TABS Oral Take 10 mg by mouth daily.    Marland Kitchen CALCIUM ACETATE 667 MG PO CAPS Oral Take 667 mg by mouth 4 (four) times daily.     Marland Kitchen CLONIDINE HCL 0.1 MG PO TABS Oral Take 0.1 mg by mouth 2 (two) times daily.    Marland Kitchen COENZYME Q10 150 MG PO CAPS Oral Take 300 mg by mouth daily.     Marland Kitchen DOCUSATE SODIUM 100 MG PO CAPS Oral Take 200 mg by mouth at bedtime.      Marland Kitchen HYDRALAZINE HCL 25 MG PO TABS Oral Take 50 mg by mouth 4 (four) times daily.    . L-METHYLFOLATE-B6-B12 3-35-2 MG PO TABS Oral Take 1 tablet by mouth 2 (two) times daily.     Marland Kitchen LOSARTAN POTASSIUM 50 MG PO TABS Oral Take 50 mg by mouth at bedtime.     Marland Kitchen METOPROLOL SUCCINATE ER 50 MG PO TB24 Oral Take 100 mg by mouth at bedtime. Does not take on dialysis days Monday Wednesday and Friday    . RENA-VITE PO TABS Oral Take 1 tablet by mouth daily.      Marland Kitchen REPAGLINIDE 0.5 MG PO TABS Oral Take 0.5 mg by  mouth 3 (three) times daily before meals.      . COLCHICINE 0.6 MG PO TABS Oral Take 0.6 mg by mouth daily as needed. For gout.      BP 198/66  Pulse 76  Temp(Src) 98.1 F (36.7 C) (Oral)  Resp 16  SpO2 100%  Physical Exam  Nursing note and vitals reviewed. Constitutional: She appears well-developed and well-nourished. No distress.  HENT:  Head: Normocephalic.  Cardiovascular: Normal rate.   Pulmonary/Chest: Effort normal.  Neurological: She is alert.  Psychiatric: She has a normal mood and affect. Her behavior is normal.    ED Course  Procedures       1. End stage renal disease       MDM  Patient seen and evaluated. Patient no acute distress. Patient with no new complaints.   Patient seen by Dr. Leretha Dykes with HD orders written.  Spoke with [facility] hospitalist.       Angus Seller, PA 08/20/11 2249

## 2011-08-21 ENCOUNTER — Observation Stay (HOSPITAL_COMMUNITY): Payer: Medicare HMO

## 2011-08-21 LAB — CBC
HCT: 34.4 % — ABNORMAL LOW (ref 36.0–46.0)
MCHC: 32 g/dL (ref 30.0–36.0)
Platelets: 192 10*3/uL (ref 150–400)
RDW: 19.1 % — ABNORMAL HIGH (ref 11.5–15.5)
WBC: 7.5 10*3/uL (ref 4.0–10.5)

## 2011-08-21 LAB — DIFFERENTIAL
Basophils Absolute: 0.1 10*3/uL (ref 0.0–0.1)
Basophils Relative: 1 % (ref 0–1)
Lymphocytes Relative: 40 % (ref 12–46)
Monocytes Absolute: 0.9 10*3/uL (ref 0.1–1.0)
Neutro Abs: 3.3 10*3/uL (ref 1.7–7.7)
Neutrophils Relative %: 44 % (ref 43–77)

## 2011-08-21 LAB — RENAL FUNCTION PANEL
Albumin: 3.5 g/dL (ref 3.5–5.2)
CO2: 29 mEq/L (ref 19–32)
Chloride: 95 mEq/L — ABNORMAL LOW (ref 96–112)
Creatinine, Ser: 6.64 mg/dL — ABNORMAL HIGH (ref 0.50–1.10)
GFR calc Af Amer: 7 mL/min — ABNORMAL LOW (ref >90)
GFR calc non Af Amer: 6 mL/min — ABNORMAL LOW (ref >90)
Potassium: 4.2 mEq/L (ref 3.5–5.1)
Sodium: 138 mEq/L (ref 135–145)

## 2011-08-21 MED ORDER — PARICALCITOL 5 MCG/ML IV SOLN
INTRAVENOUS | Status: AC
  Administered 2011-08-21: 2 ug via INTRAVENOUS
  Filled 2011-08-21: qty 1

## 2011-08-21 MED ORDER — PARICALCITOL 5 MCG/ML IV SOLN
2.0000 ug | Freq: Once | INTRAVENOUS | Status: AC
  Administered 2011-08-21: 2 ug via INTRAVENOUS
  Filled 2011-08-21: qty 0.4

## 2011-08-21 NOTE — ED Provider Notes (Signed)
Medical screening examination/treatment/procedure(s) were performed by non-physician practitioner and as supervising physician I was immediately available for consultation/collaboration.   Carleene Cooper III, MD 08/21/11 [phone]

## 2011-08-23 ENCOUNTER — Ambulatory Visit (INDEPENDENT_AMBULATORY_CARE_PROVIDER_SITE_OTHER): Payer: Medicare HMO | Admitting: Surgery

## 2011-08-23 ENCOUNTER — Encounter: Payer: Self-pay | Admitting: Neurosurgery

## 2011-08-23 ENCOUNTER — Ambulatory Visit: Payer: Medicare HMO | Admitting: Neurosurgery

## 2011-08-23 VITALS — BP 195/72 | HR 68 | Resp 14 | Ht 68.0 in | Wt 131.2 lb

## 2011-08-23 DIAGNOSIS — T82898A Other specified complication of vascular prosthetic devices, implants and grafts, initial encounter: Secondary | ICD-10-CM

## 2011-08-23 DIAGNOSIS — N186 End stage renal disease: Secondary | ICD-10-CM | POA: Insufficient documentation

## 2011-08-23 NOTE — Progress Notes (Signed)
[facility]   Patient name: Alexandra [name] MRN: [id number] DOB: 05/14/1945 Sex: female     Chief Complaint  Patient presents with  . ESRD    Right arm AVG swollen and sore    HISTORY OF PRESENT ILLNESS: The patient comes in today for followup of her right arm fistula. She was originally scheduled to have a fistulogram and December however this was canceled for social reasons. She had been discharged from her dialysis center and was trying to get this resolved and therefore the fistulogram was canceled. She is currently dialyzing in the emergency department. She has a history of having undergone angioplasty of the left upper arm fistula. Her pain was noted to be very reactive and with spasm quickly. She complains of swelling in the right arm which is been going on for approximately 2 weeks. She uses a right-sided catheter for dialysis  Past Medical History  Diagnosis Date  . Chronic kidney disease     esrd  . Diabetes mellitus   . Hyperlipidemia   . Hypertension   . Renal disorder   . Dialysis care     tues, thurs, sat  . 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 Carotid endarterectomy 2002    left  . Btl   . Rectal fissurectomy   . Av fistula placement 10/30/2010    right brachiocephalic   . Fistulogram     History   Social History  . Marital Status: Single    Spouse Name: N/A    Number of Children: N/A  . Years of Education: N/A   Occupational History  . Not on file.   Social History Main Topics  . Smoking status: Current Everyday Smoker -- 1.0 packs/day for 50 years    Types: Cigarettes  . Smokeless tobacco: Never Used  . Alcohol Use: No  . Drug Use: No  . Sexually Active: Not on file   Other Topics Concern  . Not on file   Social History Narrative  . No narrative on file    Family History  Problem Relation Age of Onset  . COPD Mother   . Kidney disease Mother   . Heart disease  Father   . Lymphoma Son     Allergies as of 08/23/2011 - Review Complete 08/23/2011  Allergen Reaction Noted  . Codeine Other (See Comments) 10/01/2010  . Epinephrine Other (See Comments) 12/14/2010  . Percocet (oxycodone-acetaminophen) Other (See Comments) 12/14/2010    Current Outpatient Prescriptions on File Prior to Visit  Medication Sig Dispense Refill  . allopurinol (ZYLOPRIM) 100 MG tablet Take 100 mg by mouth daily.        . amLODipine (NORVASC) 5 MG tablet Take 10 mg by mouth daily.      . calcium acetate (PHOSLO) 667 MG capsule Take 667 mg by mouth 4 (four) times daily.       . cloNIDine (CATAPRES) 0.1 MG tablet Take 0.2 mg by mouth at bedtime.       . Coenzyme Q10 150 MG CAPS Take 300 mg by mouth daily.       . colchicine 0.6 MG tablet Take 0.6 mg by mouth daily as needed. For gout.      . docusate sodium (COLACE) 100 MG capsule Take 200 mg by mouth at bedtime.        . hydrALAZINE (APRESOLINE) 25 MG tablet Take 50 mg by mouth 3 (three) times   daily.       . l-methylfolate-B6-B12 (METANX) 3-35-2 MG TABS Take 1 tablet by mouth 2 (two) times daily.       . losartan (COZAAR) 50 MG tablet Take 100 mg by mouth at bedtime.       . metoprolol succinate (TOPROL-XL) 50 MG 24 hr tablet Take 50 mg by mouth at bedtime. Does not take on dialysis days Monday Wednesday and Friday      . multivitamin (RENA-VIT) TABS tablet Take 1 tablet by mouth daily.        . repaglinide (PRANDIN) 0.5 MG tablet Take 0.5 mg by mouth 3 (three) times daily before meals.           REVIEW OF SYSTEMS: Please see history of present illness. Otherwise no changes.  PHYSICAL EXAMINATION:   Vital signs are BP 195/72  Pulse 68  Resp 14  Ht 5' 8" (1.727 m)  Wt 131 lb 3.2 oz (59.512 kg)  BMI 19.95 kg/m2  SpO2 98% General: The patient appears their stated age. HEENT:  No gross abnormalities Pulmonary:  Non labored breathing Musculoskeletal: There are no major deformities. Neurologic: No focal weakness or  paresthesias are detected, Skin: There are no ulcer or rashes noted. Psychiatric: The patient has normal affect. Cardiovascular: There is a thrill within the right upper arm fistula however this is not fully matured. She does have mild edema in the right arm   Diagnostic Studies None performed today  Assessment: End-stage renal disease Plan: The patient will be scheduled for a right arm fistulogram. As we performed on Tuesday, June 25. I'll access the fistula near the antecubital crease and intervene as needed. I discussed with the patient that if her fistula is unable to be improved that the next option would be to place a graft. She has a very sensitive vein which is prone to spasm this will need to be factored and during her intervention  V. Wells Srihan Brutus IV, M.D. [facility] Office: [phone] Pager:  [phone]   

## 2011-08-23 NOTE — Progress Notes (Signed)
Utilization Review Completed.Araina Butrick T6/12/2011   

## 2011-08-24 ENCOUNTER — Observation Stay (HOSPITAL_COMMUNITY): Payer: Medicare HMO

## 2011-08-24 ENCOUNTER — Observation Stay (HOSPITAL_COMMUNITY)
Admission: EM | Admit: 2011-08-24 | Discharge: 2011-08-24 | Disposition: A | Discharge status: Home / Self Care | Payer: Medicare HMO | Attending: Internal Medicine | Admitting: Internal Medicine

## 2011-08-24 ENCOUNTER — Encounter (HOSPITAL_COMMUNITY): Payer: Self-pay | Admitting: *Deleted

## 2011-08-24 DIAGNOSIS — N186 End stage renal disease: Secondary | ICD-10-CM | POA: Insufficient documentation

## 2011-08-24 DIAGNOSIS — IMO0001 Reserved for inherently not codable concepts without codable children: Secondary | ICD-10-CM

## 2011-08-24 DIAGNOSIS — E119 Type 2 diabetes mellitus without complications: Secondary | ICD-10-CM | POA: Insufficient documentation

## 2011-08-24 DIAGNOSIS — I10 Essential (primary) hypertension: Secondary | ICD-10-CM | POA: Diagnosis present

## 2011-08-24 DIAGNOSIS — E1165 Type 2 diabetes mellitus with hyperglycemia: Secondary | ICD-10-CM

## 2011-08-24 DIAGNOSIS — I120 Hypertensive chronic kidney disease with stage 5 chronic kidney disease or end stage renal disease: Secondary | ICD-10-CM | POA: Insufficient documentation

## 2011-08-24 DIAGNOSIS — Z992 Dependence on renal dialysis: Principal | ICD-10-CM | POA: Insufficient documentation

## 2011-08-24 LAB — CBC
Hemoglobin: 10.1 g/dL — ABNORMAL LOW (ref 12.0–15.0)
MCH: 24.4 pg — ABNORMAL LOW (ref 26.0–34.0)
MCHC: 32.3 g/dL (ref 30.0–36.0)
Platelets: 157 10*3/uL (ref 150–400)
RBC: 4.14 MIL/uL (ref 3.87–5.11)

## 2011-08-24 LAB — DIFFERENTIAL
Basophils Absolute: 0 10*3/uL (ref 0.0–0.1)
Basophils Relative: 1 % (ref 0–1)
Eosinophils Absolute: 0.2 10*3/uL (ref 0.0–0.7)
Monocytes Relative: 11 % (ref 3–12)
Neutro Abs: 2.7 10*3/uL (ref 1.7–7.7)
Neutrophils Relative %: 41 % — ABNORMAL LOW (ref 43–77)

## 2011-08-24 LAB — RENAL FUNCTION PANEL
Albumin: 3.3 g/dL — ABNORMAL LOW (ref 3.5–5.2)
Chloride: 94 mEq/L — ABNORMAL LOW (ref 96–112)
GFR calc Af Amer: 4 mL/min — ABNORMAL LOW (ref >90)
GFR calc non Af Amer: 4 mL/min — ABNORMAL LOW (ref >90)
Phosphorus: 4.2 mg/dL (ref 2.3–4.6)
Potassium: 4 mEq/L (ref 3.5–5.1)
Sodium: 136 mEq/L (ref 135–145)

## 2011-08-24 MED ORDER — SODIUM CHLORIDE 0.9 % IV SOLN: 100.0000 mL | INTRAVENOUS | Status: DC | PRN

## 2011-08-24 MED ORDER — HEPARIN SODIUM (PORCINE) 1000 UNIT/ML DIALYSIS
1200.0000 [IU] | INTRAMUSCULAR | Status: DC | PRN
  Filled 2011-08-24: qty 2

## 2011-08-24 MED ORDER — PARICALCITOL 5 MCG/ML IV SOLN
INTRAVENOUS | Status: AC
  Filled 2011-08-24: qty 1

## 2011-08-24 MED ORDER — PARICALCITOL 5 MCG/ML IV SOLN
1.0000 ug | Freq: Once | INTRAVENOUS | Status: AC
  Administered 2011-08-24: 1 ug via INTRAVENOUS
  Filled 2011-08-24: qty 0.2

## 2011-08-24 MED ORDER — HEPARIN SODIUM (PORCINE) 1000 UNIT/ML DIALYSIS
1000.0000 [IU] | INTRAMUSCULAR | Status: DC | PRN
  Filled 2011-08-24: qty 1

## 2011-08-24 MED ORDER — ALTEPLASE 2 MG IJ SOLR
2.0000 mg | Freq: Once | INTRAMUSCULAR | Status: DC | PRN
  Filled 2011-08-24: qty 2

## 2011-08-24 MED ORDER — NEPRO/CARBSTEADY PO LIQD
237.0000 mL | ORAL | Status: DC | PRN
  Filled 2011-08-24: qty 237

## 2011-08-24 NOTE — ED Notes (Signed)
The pt is here for dialysis 

## 2011-08-24 NOTE — ED Notes (Signed)
EDP at [facility] 

## 2011-08-24 NOTE — ED Notes (Signed)
Pt alert, NAD, calm, interactive, skin W&D, resps e/u, speaking in clear complete sentences, sitting in chair, "here for routine T,TH,W dialysis (coming in the night before), denies acute sx. CN has spoken with HD RN, "they are ready for her and have HD orders from Dr. Bascom Levels for her". Placed in room, EDP aware.

## 2011-08-24 NOTE — ED Provider Notes (Signed)
History     CSN: [id number]  Arrival date & time 08/24/11  0005   First MD Initiated Contact with Patient 08/24/11 0243      Chief Complaint  Patient presents with  . needs dialysis     (Consider location/radiation/quality/duration/timing/severity/associated sxs/prior treatment) HPI 66 yo female presents to the ER for dialysis.  She is without shortness of breath, chest pain, or other acute complaints.  Pt reports she recently had her clonidine increased from 0.1 to 0.2 mg, and has been feeling drunk with this increase.  Pt has f/u scheduled regarding this increase.     Past Medical History  Diagnosis Date  . Chronic kidney disease     esrd  . Diabetes mellitus   . Hyperlipidemia   . Hypertension   . Renal disorder   . Dialysis care     tues, thurs, sat  . 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 Carotid endarterectomy 2002    left  . Btl   . Rectal fissurectomy   . Av fistula placement 10/30/2010    right brachiocephalic   . Fistulogram     Family History  Problem Relation Age of Onset  . COPD Mother   . Kidney disease Mother   . Heart disease Father   . Lymphoma Son     History  Substance Use Topics  . Smoking status: Current Everyday Smoker -- 1.0 packs/day for 50 years    Types: Cigarettes  . Smokeless tobacco: Never Used  . Alcohol Use: No    OB History    Grav Para Term Preterm Abortions TAB SAB Ect Mult Living                  Review of Systems  All other systems reviewed and are negative.    Allergies  Codeine; Epinephrine; and Percocet  Home Medications   Current Outpatient Rx  Name Route Sig Dispense Refill  . ALLOPURINOL 100 MG PO TABS Oral Take 100 mg by mouth daily.      Marland Kitchen AMLODIPINE BESYLATE 5 MG PO TABS Oral Take 10 mg by mouth daily.    Marland Kitchen CALCIUM ACETATE 667 MG PO CAPS Oral Take 667 mg by mouth 4 (four) times daily.     Marland Kitchen CLONIDINE HCL 0.2 MG PO TABS Oral Take 0.2 mg by mouth at bedtime.      Marland Kitchen COENZYME Q10 150 MG PO CAPS Oral Take 300 mg by mouth daily.     . COLCHICINE 0.6 MG PO TABS Oral Take 0.6 mg by mouth daily as needed. For gout.    Marland Kitchen DOCUSATE SODIUM 100 MG PO CAPS Oral Take 200 mg by mouth at bedtime.      Marland Kitchen HYDRALAZINE HCL 25 MG PO TABS Oral Take 50 mg by mouth 3 (three) times daily.     . L-METHYLFOLATE-B6-B12 3-35-2 MG PO TABS Oral Take 1 tablet by mouth 2 (two) times daily.     Marland Kitchen LOSARTAN POTASSIUM 50 MG PO TABS Oral Take 100 mg by mouth at bedtime.     Marland Kitchen METOPROLOL SUCCINATE ER 50 MG PO TB24 Oral Take 50 mg by mouth at bedtime. Does not take on dialysis days Monday Wednesday and Friday    . RENA-VITE PO TABS Oral Take 1 tablet by mouth daily.      Marland Kitchen REPAGLINIDE 0.5 MG PO TABS Oral Take 0.5 mg by mouth 3 (three) times daily before meals.  BP 197/73  Pulse 77  Temp(Src) 98 F (36.7 C) (Oral)  Resp 16  Wt 128 lb 1.4 oz (58.1 kg)  SpO2 91%  Physical Exam  Nursing note and vitals reviewed. Constitutional: She appears well-developed and well-nourished. No distress.  HENT:  Head: Normocephalic and atraumatic.  Eyes: Conjunctivae and EOM are normal. Pupils are equal, round, and reactive to light.  Neck: Normal range of motion. Neck supple. No JVD present. No tracheal deviation present. No thyromegaly present.  Cardiovascular: Normal rate, regular rhythm, normal heart sounds and intact distal pulses.  Exam reveals no gallop and no friction rub.   No murmur heard. Pulmonary/Chest: Effort normal and breath sounds normal. No stridor. No respiratory distress. She has no wheezes. She has no rales. She exhibits no tenderness.  Abdominal: Soft. Bowel sounds are normal. She exhibits no distension and no mass. There is no tenderness. There is no rebound and no guarding.  Lymphadenopathy:    She has no cervical adenopathy.  Skin: Skin is warm and dry. No rash noted. She is not diaphoretic. No erythema. No pallor.  Psychiatric: She has a normal mood and affect. Her  behavior is normal. Thought content normal.    ED Course  Procedures (including critical care time)  Labs Reviewed  CBC - Abnormal; Notable for the following:    Hemoglobin 10.1 (*)    HCT 31.3 (*)    MCV 75.6 (*)    MCH 24.4 (*)    RDW 19.3 (*)    All other components within normal limits  DIFFERENTIAL - Abnormal; Notable for the following:    Neutrophils Relative 41 (*)    All other components within normal limits  RENAL FUNCTION PANEL - Abnormal; Notable for the following:    Chloride 94 (*)    Glucose, Bld 148 (*)    BUN 82 (*)    Creatinine, Ser 9.53 (*)    Albumin 3.3 (*)    GFR calc non Af Amer 4 (*)    GFR calc Af Amer 4 (*)    All other components within normal limits   No results found.   1. End stage renal disease       MDM  Pt with ESRD who does not have community access to dialysis, here for her routine dialysis.        Olivia Mackie, MD 08/24/11 0900

## 2011-08-24 NOTE — Progress Notes (Signed)
Hemodialysis-See flowsheet Treatment ended early per pt request. BP drop at end of treatment precipitating factor. Pt discharged through ED via wheelchair. Dr. Bascom Levels aware.

## 2011-08-24 NOTE — ED Notes (Addendum)
Pt updated, no changes, alert, NAD, calm, interactive, ambulatory in h/w, up to HD in w/c with EMT. Report called to HD RN [name].

## 2011-08-24 NOTE — ED Notes (Addendum)
Pt sitting in chair with warm blankets, feet propped up, reading newspaper & watching TV, updated on wait.

## 2011-08-24 NOTE — H&P (Signed)
Alexandra [name] is an 66 y.o. female.   Nephrologist - Dr.Frazier. Chief Complaint: Has come for regular dialysis. HPI: 66 year old female with known h/o ESRD on HD has come for regular dialysis. Denies any chest pain or shortness of breath.States she is scheduled for fistulogram this month end.  Past Medical History  Diagnosis Date  . Chronic kidney disease     esrd  . Diabetes mellitus   . Hyperlipidemia   . Hypertension   . Renal disorder   . Dialysis care     tues, thurs, sat  . 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 Carotid endarterectomy 2002    left  . Btl   . Rectal fissurectomy   . Av fistula placement 10/30/2010    right brachiocephalic   . Fistulogram     Family History  Problem Relation Age of Onset  . COPD Mother   . Kidney disease Mother   . Heart disease Father   . Lymphoma Son    Social History:  reports that she has been smoking Cigarettes.  She has a 50 pack-year smoking history. She has never used smokeless tobacco. She reports that she does not drink alcohol or use illicit drugs.  Allergies:  Allergies  Allergen Reactions  . Codeine Other (See Comments)    Passes out  . Epinephrine Other (See Comments)    Tachycardia, diaphoresis, syncope  . Percocet (Oxycodone-Acetaminophen) Other (See Comments)    Passes  out     (Not in a hospital admission)  No results found for this or any previous visit (from the past 48 hour(s)). No results found.  Review of Systems  Constitutional: Negative.   HENT: Negative.   Eyes: Negative.   Respiratory: Negative.   Cardiovascular: Negative.   Gastrointestinal: Negative.   Genitourinary: Negative.   Musculoskeletal: Negative.   Skin: Negative.   Neurological: Negative.   Endo/Heme/Allergies: Negative.   Psychiatric/Behavioral: Negative.     Blood pressure 188/80, pulse 74, temperature 97.3 F (36.3 C), temperature source Oral, resp. rate 18, SpO2  95.00%. Physical Exam  Constitutional: She is oriented to person, place, and time. She appears well-developed and well-nourished. No distress.  HENT:  Head: Normocephalic and atraumatic.  Right Ear: External ear normal.  Left Ear: External ear normal.  Mouth/Throat: Oropharynx is clear and moist. No oropharyngeal exudate.  Eyes: Conjunctivae are normal. Pupils are equal, round, and reactive to light. Right eye exhibits no discharge. Left eye exhibits no discharge. No scleral icterus.  Neck: Normal range of motion.  Cardiovascular: Normal rate and regular rhythm.   Respiratory: Effort normal. No respiratory distress. She has no wheezes. She has no rales.  GI: Soft. Bowel sounds are normal. She exhibits no distension. There is no tenderness. There is no rebound.  Musculoskeletal: Normal range of motion. She exhibits no tenderness.  Neurological: She is alert and oriented to person, place, and time.       Moves all extremities.  Skin: Skin is warm and dry. She is not diaphoretic.  Psychiatric: Her behavior is normal.     Assessment/Plan #1.ESRD on HD - dialysis per nephrologist. #2.Htn - continue home medications. #3.Diabetes mellitus type 2 - Continue home medications.  Code Status - Full code.  Roxan Yamamoto N. 08/24/2011, 3:26 AM

## 2011-08-25 ENCOUNTER — Observation Stay (HOSPITAL_COMMUNITY)
Admission: EM | Admit: 2011-08-25 | Discharge: 2011-08-26 | Disposition: A | Discharge status: Home / Self Care | Payer: Medicare HMO | Attending: Internal Medicine | Admitting: Internal Medicine

## 2011-08-25 ENCOUNTER — Encounter (HOSPITAL_COMMUNITY): Payer: Self-pay | Admitting: Emergency Medicine

## 2011-08-25 DIAGNOSIS — N186 End stage renal disease: Secondary | ICD-10-CM

## 2011-08-25 DIAGNOSIS — I10 Essential (primary) hypertension: Secondary | ICD-10-CM | POA: Diagnosis present

## 2011-08-25 DIAGNOSIS — E119 Type 2 diabetes mellitus without complications: Secondary | ICD-10-CM | POA: Insufficient documentation

## 2011-08-25 DIAGNOSIS — E785 Hyperlipidemia, unspecified: Secondary | ICD-10-CM | POA: Insufficient documentation

## 2011-08-25 DIAGNOSIS — E1165 Type 2 diabetes mellitus with hyperglycemia: Secondary | ICD-10-CM

## 2011-08-25 DIAGNOSIS — I120 Hypertensive chronic kidney disease with stage 5 chronic kidney disease or end stage renal disease: Secondary | ICD-10-CM | POA: Insufficient documentation

## 2011-08-25 DIAGNOSIS — Z992 Dependence on renal dialysis: Principal | ICD-10-CM | POA: Insufficient documentation

## 2011-08-25 LAB — GLUCOSE, CAPILLARY
Glucose-Capillary: 117 mg/dL — ABNORMAL HIGH (ref 70–99)
Glucose-Capillary: 165 mg/dL — ABNORMAL HIGH (ref 70–99)

## 2011-08-25 MED ORDER — HEPARIN SODIUM (PORCINE) 1000 UNIT/ML DIALYSIS
20.0000 [IU]/kg | INTRAMUSCULAR | Status: DC | PRN
  Filled 2011-08-25: qty 2

## 2011-08-25 NOTE — ED Notes (Signed)
Dialysis told that pt is here; diet tray ordered

## 2011-08-25 NOTE — Progress Notes (Signed)
Hemodialysis orders being written at present.

## 2011-08-25 NOTE — ED Provider Notes (Signed)
History     CSN: [id number]  Arrival date & time 08/25/11  6295   First MD Initiated Contact with Patient 08/25/11 1951      Chief Complaint  Patient presents with  . Vascular Access Problem    (Consider location/radiation/quality/duration/timing/severity/associated sxs/prior treatment) HPI  66 year old female who has history of renal failure currently on dialysis presents to ED for her regular dialysis care. She is well known to our establishment. She denies any acute symptoms.    Past Medical History  Diagnosis Date  . Chronic kidney disease     esrd  . Diabetes mellitus   . Hyperlipidemia   . Hypertension   . Renal disorder   . Dialysis care     tues, thurs, sat  . 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 Carotid endarterectomy 2002    left  . Btl   . Rectal fissurectomy   . Av fistula placement 10/30/2010    right brachiocephalic   . Fistulogram     Family History  Problem Relation Age of Onset  . COPD Mother   . Kidney disease Mother   . Heart disease Father   . Lymphoma Son     History  Substance Use Topics  . Smoking status: Current Everyday Smoker -- 1.0 packs/day for 50 years    Types: Cigarettes  . Smokeless tobacco: Never Used  . Alcohol Use: No    OB History    Grav Para Term Preterm Abortions TAB SAB Ect Mult Living                  Review of Systems  All other systems reviewed and are negative.    Allergies  Codeine; Epinephrine; and Percocet  Home Medications   Current Outpatient Rx  Name Route Sig Dispense Refill  . ALLOPURINOL 100 MG PO TABS Oral Take 100 mg by mouth daily.      Marland Kitchen AMLODIPINE BESYLATE 5 MG PO TABS Oral Take 10 mg by mouth daily.    Marland Kitchen CALCIUM ACETATE 667 MG PO CAPS Oral Take 667 mg by mouth 4 (four) times daily.     Marland Kitchen CLONIDINE HCL 0.2 MG PO TABS Oral Take 0.2 mg by mouth at bedtime.    Marland Kitchen COENZYME Q10 150 MG PO CAPS Oral Take 300 mg by mouth daily.     . COLCHICINE 0.6 MG  PO TABS Oral Take 0.6 mg by mouth daily as needed. For gout.    Marland Kitchen DOCUSATE SODIUM 100 MG PO CAPS Oral Take 200 mg by mouth at bedtime.      Marland Kitchen HYDRALAZINE HCL 25 MG PO TABS Oral Take 50 mg by mouth 3 (three) times daily.     . L-METHYLFOLATE-B6-B12 3-35-2 MG PO TABS Oral Take 1 tablet by mouth 2 (two) times daily.     Marland Kitchen LOSARTAN POTASSIUM 50 MG PO TABS Oral Take 100 mg by mouth at bedtime.     Marland Kitchen METOPROLOL SUCCINATE ER 50 MG PO TB24 Oral Take 50 mg by mouth at bedtime. Does not take on dialysis days Monday Wednesday and Friday    . RENA-VITE PO TABS Oral Take 1 tablet by mouth daily.      Marland Kitchen REPAGLINIDE 0.5 MG PO TABS Oral Take 0.5 mg by mouth 3 (three) times daily before meals.        BP 191/74  Pulse 78  Temp 98.4 F (36.9 C) (Oral)  Resp 16  SpO2  99%  Physical Exam  Nursing note and vitals reviewed. Constitutional: She is oriented to person, place, and time. She appears well-developed and well-nourished. No distress.  HENT:  Head: Atraumatic.  Eyes: Conjunctivae are normal.  Neck: Normal range of motion. Neck supple.  Cardiovascular: Normal rate and regular rhythm.   Pulmonary/Chest: Effort normal and breath sounds normal. No respiratory distress. She exhibits no tenderness.  Abdominal: Soft. There is no tenderness.  Musculoskeletal: Normal range of motion. She exhibits edema. She exhibits no tenderness.       Trace pitting edema noted to lower extremities bilaterally  Neurological: She is alert and oriented to person, place, and time.  Skin: Skin is warm. No rash noted.    ED Course  Procedures (including critical care time)  Labs Reviewed  GLUCOSE, CAPILLARY - Abnormal; Notable for the following:    Glucose-Capillary 117 (*)     All other components within normal limits   No results found.   No diagnosis found.    MDM  Patient is here for her regular dialysis. No EMC.     8:17 PM I have consult with Dr. Anne Hahn M.D., from [facility] hospitalist who agrees to  admit the patient. Patient is to be admitted to a regular floor, team 6.  Pt aware of plan.     Fayrene Helper, PA-C 08/25/11 2019

## 2011-08-25 NOTE — ED Notes (Signed)
CBG was taken after pt had ate.  Nurse notified.

## 2011-08-25 NOTE — ED Notes (Addendum)
Pt here for routine eval, denies acute sx. Pt alert, NAD, calm, interactive, skin W&D, resps e/u, speaking in clear complete sentences, sitting in chair, cbg done, evening meal tray ordered. EDPA notified of pt. This RN spoke with Kandee Keen, HD RN. "they have HD orders for this pt and are ready for her if she has admission orders, call HD prior to sending pt up". Waiting for EDP & [facility] eval.

## 2011-08-25 NOTE — Consult Note (Signed)
Pt. In some extra fluid. Dialysis orders written.

## 2011-08-25 NOTE — ED Notes (Signed)
Secretary called to request diet per pt request

## 2011-08-25 NOTE — H&P (Signed)
Alexandra [name] is an 66 y.o. female.   Nephrologist - Dr.Frazier. Chief Complaint: Has come for regular dialysis. HPI: 66 year-old female with history of ESRD on hemodialysis has come for her regular dialysis. Denies any chest pain or shortness of breath. Patient has no specific complaints.  Past Medical History  Diagnosis Date  . Chronic kidney disease     esrd  . Diabetes mellitus   . Hyperlipidemia   . Hypertension   . Renal disorder   . Dialysis care     tues, thurs, sat  . 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 Carotid endarterectomy 2002    left  . Btl   . Rectal fissurectomy   . Av fistula placement 10/30/2010    right brachiocephalic   . Fistulogram     Family History  Problem Relation Age of Onset  . COPD Mother   . Kidney disease Mother   . Heart disease Father   . Lymphoma Son    Social History:  reports that she has been smoking Cigarettes.  She has a 50 pack-year smoking history. She has never used smokeless tobacco. She reports that she does not drink alcohol or use illicit drugs.  Allergies:  Allergies  Allergen Reactions  . Codeine Other (See Comments)    Passes out  . Epinephrine Other (See Comments)    Tachycardia, diaphoresis, syncope  . Percocet (Oxycodone-Acetaminophen) Other (See Comments)    Passes  out     (Not in a hospital admission)  Results for orders placed during the hospital encounter of 08/25/11 (from the past 48 hour(s))  GLUCOSE, CAPILLARY     Status: Abnormal   Collection Time   08/25/11  7:32 PM      Component Value Range Comment   Glucose-Capillary 117 (*) 70 - 99 mg/dL   GLUCOSE, CAPILLARY     Status: Abnormal   Collection Time   08/25/11  9:16 PM      Component Value Range Comment   Glucose-Capillary 165 (*) 70 - 99 mg/dL    No results found.  Review of Systems  Constitutional: Negative.   HENT: Negative.   Eyes: Negative.   Respiratory: Negative.   Cardiovascular:  Negative.   Gastrointestinal: Negative.   Genitourinary: Negative.   Musculoskeletal: Negative.   Skin: Negative.   Neurological: Negative.   Endo/Heme/Allergies: Negative.   Psychiatric/Behavioral: Negative.     Blood pressure 190/80, pulse 76, temperature 97.4 F (36.3 C), temperature source Oral, resp. rate 16, height 5\' 8"  (1.727 m), weight 58.1 kg (128 lb 1.4 oz), SpO2 99.00%. Physical Exam  Constitutional: She is oriented to person, place, and time. She appears well-developed and well-nourished. No distress.  HENT:  Head: Normocephalic and atraumatic.  Right Ear: External ear normal.  Left Ear: External ear normal.  Nose: Nose normal.  Mouth/Throat: Oropharynx is clear and moist. No oropharyngeal exudate.  Eyes: Conjunctivae are normal. Pupils are equal, round, and reactive to light. Right eye exhibits no discharge. Left eye exhibits no discharge. No scleral icterus.  Neck: Normal range of motion. Neck supple.  Cardiovascular: Normal rate and regular rhythm.   Respiratory: Effort normal and breath sounds normal. No respiratory distress. She has no wheezes. She has no rales.  GI: Soft. Bowel sounds are normal. She exhibits no distension. There is no tenderness. There is no rebound.  Musculoskeletal: Normal range of motion. She exhibits no edema and no tenderness.  Neurological: She  is alert and oriented to person, place, and time.  Skin: Skin is warm and dry. She is not diaphoretic.     Assessment/Plan #1. ESRD on hemodialysis - dialysis per nephrologist. #2. Hypertension - mildly increased. Recheck blood pressure after dialysis. Continue home medications. #3. Diabetes mellitus type 2 - continue home medications. Check CBGs.  Lucia Mccreadie N. 08/25/2011, 10:00 PM

## 2011-08-25 NOTE — ED Notes (Signed)
Pt here for dialysis; pt denies any new complaint

## 2011-08-26 ENCOUNTER — Other Ambulatory Visit: Payer: Self-pay

## 2011-08-26 ENCOUNTER — Encounter (HOSPITAL_COMMUNITY): Payer: Self-pay | Admitting: Pharmacy Technician

## 2011-08-26 LAB — CBC
HCT: 32.7 % — ABNORMAL LOW (ref 36.0–46.0)
Hemoglobin: 10.3 g/dL — ABNORMAL LOW (ref 12.0–15.0)
MCHC: 31.5 g/dL (ref 30.0–36.0)
RDW: 19.8 % — ABNORMAL HIGH (ref 11.5–15.5)
WBC: 7.5 10*3/uL (ref 4.0–10.5)

## 2011-08-26 LAB — RENAL FUNCTION PANEL
Albumin: 3.4 g/dL — ABNORMAL LOW (ref 3.5–5.2)
CO2: 27 mEq/L (ref 19–32)
Chloride: 97 mEq/L (ref 96–112)
GFR calc Af Amer: 6 mL/min — ABNORMAL LOW (ref >90)
GFR calc non Af Amer: 5 mL/min — ABNORMAL LOW (ref >90)
Potassium: 3.8 mEq/L (ref 3.5–5.1)

## 2011-08-26 LAB — DIFFERENTIAL
Basophils Absolute: 0 10*3/uL (ref 0.0–0.1)
Basophils Relative: 0 % (ref 0–1)
Lymphocytes Relative: 33 % (ref 12–46)
Neutro Abs: 3.7 10*3/uL (ref 1.7–7.7)
Neutrophils Relative %: 50 % (ref 43–77)

## 2011-08-26 LAB — HEPATITIS B SURFACE ANTIGEN: Hepatitis B Surface Ag: NEGATIVE

## 2011-08-26 MED ORDER — PARICALCITOL 5 MCG/ML IV SOLN
2.0000 ug | Freq: Once | INTRAVENOUS | Status: AC
  Administered 2011-08-26: 2 ug via INTRAVENOUS

## 2011-08-26 MED ORDER — DARBEPOETIN ALFA-POLYSORBATE 100 MCG/0.5ML IJ SOLN
100.0000 ug | Freq: Once | INTRAMUSCULAR | Status: AC
  Administered 2011-08-26: 100 ug via INTRAVENOUS

## 2011-08-26 MED ORDER — PARICALCITOL 5 MCG/ML IV SOLN
INTRAVENOUS | Status: AC
  Administered 2011-08-26: 2 ug via INTRAVENOUS
  Filled 2011-08-26: qty 1

## 2011-08-26 MED ORDER — DARBEPOETIN ALFA-POLYSORBATE 100 MCG/0.5ML IJ SOLN
INTRAMUSCULAR | Status: AC
  Administered 2011-08-26: 100 ug via INTRAVENOUS
  Filled 2011-08-26: qty 0.5

## 2011-08-27 ENCOUNTER — Observation Stay (HOSPITAL_COMMUNITY): Payer: Medicare HMO

## 2011-08-27 ENCOUNTER — Encounter (HOSPITAL_COMMUNITY): Payer: Self-pay | Admitting: *Deleted

## 2011-08-27 ENCOUNTER — Observation Stay (HOSPITAL_COMMUNITY)
Admission: EM | Admit: 2011-08-27 | Discharge: 2011-08-28 | Disposition: A | Discharge status: Home / Self Care | Payer: Medicare HMO | Attending: Internal Medicine | Admitting: Internal Medicine

## 2011-08-27 DIAGNOSIS — E1122 Type 2 diabetes mellitus with diabetic chronic kidney disease: Secondary | ICD-10-CM | POA: Diagnosis present

## 2011-08-27 DIAGNOSIS — N186 End stage renal disease: Secondary | ICD-10-CM

## 2011-08-27 DIAGNOSIS — E1165 Type 2 diabetes mellitus with hyperglycemia: Secondary | ICD-10-CM

## 2011-08-27 DIAGNOSIS — Z992 Dependence on renal dialysis: Principal | ICD-10-CM | POA: Insufficient documentation

## 2011-08-27 DIAGNOSIS — I120 Hypertensive chronic kidney disease with stage 5 chronic kidney disease or end stage renal disease: Secondary | ICD-10-CM | POA: Insufficient documentation

## 2011-08-27 DIAGNOSIS — E119 Type 2 diabetes mellitus without complications: Secondary | ICD-10-CM | POA: Insufficient documentation

## 2011-08-27 DIAGNOSIS — N19 Unspecified kidney failure: Secondary | ICD-10-CM | POA: Diagnosis present

## 2011-08-27 DIAGNOSIS — D638 Anemia in other chronic diseases classified elsewhere: Secondary | ICD-10-CM | POA: Diagnosis present

## 2011-08-27 LAB — DIFFERENTIAL
Basophils Absolute: 0 10*3/uL (ref 0.0–0.1)
Basophils Relative: 0 % (ref 0–1)
Eosinophils Absolute: 0.2 10*3/uL (ref 0.0–0.7)
Lymphs Abs: 2.6 10*3/uL (ref 0.7–4.0)
Neutrophils Relative %: 52 % (ref 43–77)

## 2011-08-27 LAB — CBC
MCH: 23.9 pg — ABNORMAL LOW (ref 26.0–34.0)
MCHC: 31.8 g/dL (ref 30.0–36.0)
Platelets: 201 10*3/uL (ref 150–400)
RBC: 4.57 MIL/uL (ref 3.87–5.11)
RDW: 19.2 % — ABNORMAL HIGH (ref 11.5–15.5)

## 2011-08-27 MED ORDER — HEPARIN SODIUM (PORCINE) 1000 UNIT/ML DIALYSIS
20.0000 [IU]/kg | INTRAMUSCULAR | Status: DC | PRN
  Filled 2011-08-27: qty 2

## 2011-08-27 NOTE — ED Provider Notes (Signed)
Medical screening examination/treatment/procedure(s) were performed by non-physician practitioner and as supervising physician I was immediately available for consultation/collaboration.    Nelia Shi, MD 08/27/11 [phone]

## 2011-08-27 NOTE — ED Notes (Signed)
Pt here for routine HD, denies acute sx, Dr. Bascom Levels at side now speaking with pt. HD RN Kandee Keen, EDPA  & CN notified. Pt alert, NAD, calm, interactive, resps e/u, speaking in clear complete sentences, reading newspaper, sitting in w/c. Call HD prior to bring pt up. Pt needs admission orders prior to going to HD.

## 2011-08-27 NOTE — ED Notes (Signed)
HD RN Kandee Keen "ready for pt", admitting orders received, no changes with pt, preparing to transport pt to HD in w/c.

## 2011-08-27 NOTE — ED Provider Notes (Signed)
History     CSN: [id number]  Arrival date & time 08/27/11  2104   First MD Initiated Contact with Patient 08/27/11 2118      No chief complaint on file.   (Consider location/radiation/quality/duration/timing/severity/associated sxs/prior treatment) HPI Comments: Patient presents to ED for her regular dialysis.  She is a patient with chronic renal failure, well known to the ED.  States she is feeling absolutely fine, denies any problems, specifically denies SOB.  Has been seen by Dr Jeri Cos who has put in orders for her dialysis and they are ready for her upstairs.  Per new agreement with [facility] hospitalists, pt to be admitted through them without blood work being drawn prior to their orders.    The history is provided by the patient.    Past Medical History  Diagnosis Date  . Chronic kidney disease     esrd  . Diabetes mellitus   . Hyperlipidemia   . Hypertension   . Renal disorder   . Dialysis care     tues, thurs, sat  . 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 Carotid endarterectomy 2002    left  . Btl   . Rectal fissurectomy   . Av fistula placement 10/30/2010    right brachiocephalic   . Fistulogram     Family History  Problem Relation Age of Onset  . COPD Mother   . Kidney disease Mother   . Heart disease Father   . Lymphoma Son     History  Substance Use Topics  . Smoking status: Current Everyday Smoker -- 1.0 packs/day for 50 years    Types: Cigarettes  . Smokeless tobacco: Never Used  . Alcohol Use: No    OB History    Grav Para Term Preterm Abortions TAB SAB Ect Mult Living                  Review of Systems  Respiratory: Negative for shortness of breath.   Cardiovascular: Negative for chest pain.    Allergies  Codeine; Epinephrine; and Percocet  Home Medications   Current Outpatient Rx  Name Route Sig Dispense Refill  . ALLOPURINOL 100 MG PO TABS Oral Take 100 mg by mouth daily.      Marland Kitchen  AMLODIPINE BESYLATE 5 MG PO TABS Oral Take 10 mg by mouth daily.    Marland Kitchen CALCIUM ACETATE 667 MG PO CAPS Oral Take 667 mg by mouth 4 (four) times daily.     Marland Kitchen CLONIDINE HCL 0.2 MG PO TABS Oral Take 0.2 mg by mouth at bedtime.    Marland Kitchen COENZYME Q10 150 MG PO CAPS Oral Take 300 mg by mouth daily.     . COLCHICINE 0.6 MG PO TABS Oral Take 0.6 mg by mouth daily as needed. For gout.    Marland Kitchen DOCUSATE SODIUM 100 MG PO CAPS Oral Take 200 mg by mouth at bedtime.      Marland Kitchen HYDRALAZINE HCL 25 MG PO TABS Oral Take 50 mg by mouth 3 (three) times daily.     . L-METHYLFOLATE-B6-B12 3-35-2 MG PO TABS Oral Take 1 tablet by mouth 2 (two) times daily.     Marland Kitchen LOSARTAN POTASSIUM 50 MG PO TABS Oral Take 100 mg by mouth at bedtime.     Marland Kitchen METOPROLOL SUCCINATE ER 50 MG PO TB24 Oral Take 50 mg by mouth at bedtime. Does not take on dialysis days Monday Wednesday and Friday    .  RENA-VITE PO TABS Oral Take 1 tablet by mouth daily.      Marland Kitchen REPAGLINIDE 0.5 MG PO TABS Oral Take 0.5 mg by mouth 3 (three) times daily before meals.        BP 221/80  Pulse 88  Temp 97.6 F (36.4 C) (Oral)  Resp 18  SpO2 98%  Physical Exam  Nursing note and vitals reviewed. Constitutional: She appears well-developed and well-nourished. No distress.  HENT:  Head: Normocephalic and atraumatic.  Neck: Neck supple.  Pulmonary/Chest: Effort normal.  Neurological: She is alert.  Skin: She is not diaphoretic.    ED Course  Procedures (including critical care time)  Labs Reviewed - No data to display No results found.  9:48 PM Dr Jeraldine Loots is aware of patient.    1. End stage renal disease on dialysis       MDM  9:54 PM I spoke with Dr Conley Rolls, [facility] hospitalist, who will admit the patient.         Dillard Cannon [place], [place] 08/27/11 2154

## 2011-08-27 NOTE — Consult Note (Signed)
Pt. In ER .still some ankle edema.dialysis orders written.

## 2011-08-27 NOTE — H&P (Signed)
PCP:   Alexandra Floro, MD   Chief Complaint: Patient is here for dialysis. She offers no complaint   HPI: Alexandra [name] is an 66 y.o. female with history of end-stage renal disease on dialysis, well known to the hospitalist service, and she had been admitted to the hospital for her dialysis. She has no chest pain shortness of breath nausea vomiting or any other symptomology.  Rewiew of Systems:  The patient denies anorexia, fever, weight loss,, vision loss, decreased hearing, hoarseness, chest pain, syncope, dyspnea on exertion, peripheral edema, balance deficits, hemoptysis, abdominal pain, melena, hematochezia, severe indigestion/heartburn, hematuria, incontinence, genital sores, muscle weakness, suspicious skin lesions, transient blindness, difficulty walking, depression, unusual weight change, abnormal bleeding, enlarged lymph nodes, angioedema, and breast masses.    Past Medical History  Diagnosis Date  . Chronic kidney disease     esrd  . Diabetes mellitus   . Hyperlipidemia   . Hypertension   . Renal disorder   . Dialysis care     tues, thurs, sat  . 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 Carotid endarterectomy 2002    left  . Btl   . Rectal fissurectomy   . Av fistula placement 10/30/2010    right brachiocephalic   . Fistulogram     Medications:  HOME MEDS: Prior to Admission medications   Medication Sig Start Date End Date Taking? Authorizing Provider  allopurinol (ZYLOPRIM) 100 MG tablet Take 100 mg by mouth daily.     Yes Historical Provider, MD  amLODipine (NORVASC) 5 MG tablet Take 10 mg by mouth daily. 07/13/11  Yes Alexandra Norman, MD  calcium acetate (PHOSLO) 667 MG capsule Take 667 mg by mouth 4 (four) times daily.    Yes Historical Provider, MD  cloNIDine (CATAPRES) 0.2 MG tablet Take 0.2 mg by mouth at bedtime.   Yes Historical Provider, MD  Coenzyme Q10 150 MG CAPS Take 300 mg by mouth daily.    Yes Historical  Provider, MD  colchicine 0.6 MG tablet Take 0.6 mg by mouth daily as needed. For gout.   Yes Historical Provider, MD  docusate sodium (COLACE) 100 MG capsule Take 200 mg by mouth at bedtime.     Yes Historical Provider, MD  hydrALAZINE (APRESOLINE) 25 MG tablet Take 50 mg by mouth 3 (three) times daily.  07/18/11  Yes Alexandra Forrestine Him, MD  l-methylfolate-B6-B12 (METANX) 3-35-2 MG TABS Take 1 tablet by mouth 2 (two) times daily.    Yes Historical Provider, MD  losartan (COZAAR) 50 MG tablet Take 100 mg by mouth at bedtime.    Yes Historical Provider, MD  metoprolol succinate (TOPROL-XL) 50 MG 24 hr tablet Take 50 mg by mouth at bedtime. Does not take on dialysis days Monday Wednesday and Friday 07/18/11  Yes Alexandra Forrestine Him, MD  multivitamin (RENA-VIT) TABS tablet Take 1 tablet by mouth daily.     Yes Historical Provider, MD  repaglinide (PRANDIN) 0.5 MG tablet Take 0.5 mg by mouth 3 (three) times daily before meals.     Yes Historical Provider, MD     Allergies:  Allergies  Allergen Reactions  . Codeine Other (See Comments)    Passes out  . Epinephrine Other (See Comments)    Tachycardia, diaphoresis, syncope  . Percocet (Oxycodone-Acetaminophen) Other (See Comments)    Passes  out    Social History:   reports that she has been smoking Cigarettes.  She has a  50 pack-year smoking history. She has never used smokeless tobacco. She reports that she does not drink alcohol or use illicit drugs.  Family History: Family History  Problem Relation Age of Onset  . COPD Mother   . Kidney disease Mother   . Heart disease Father   . Lymphoma Son      Physical Exam: Filed Vitals:   08/27/11 2113  BP: 221/80  Pulse: 88  Temp: 97.6 F (36.4 C)  TempSrc: Oral  Resp: 18  SpO2: 98%   Blood pressure 221/80, pulse 88, temperature 97.6 F (36.4 C), temperature source Oral, resp. rate 18, SpO2 98.00%.  GEN:  Pleasant person lying in the stretcher in no acute distress; cooperative with  exam PSYCH:  alert and oriented x4; does not appear anxious or depressed; affect is appropriate. HEENT: Mucous membranes pink and anicteric; PERRLA; EOM intact; no cervical lymphadenopathy nor thyromegaly or carotid bruit; no JVD; Breasts:: Not examined CHEST WALL: No tenderness CHEST: Normal respiration, clear to auscultation bilaterally HEART: Regular rate and rhythm; no murmurs rubs or gallops BACK: No kyphosis or scoliosis; no CVA tenderness ABDOMEN: Obese, soft non-tender; no masses, no organomegaly, normal abdominal bowel sounds; no pannus; no intertriginous candida. Rectal Exam: Not done EXTREMITIES: No bone or joint deformity; age-appropriate arthropathy of the hands and knees; no edema; no ulcerations. Genitalia: not examined PULSES: 2+ and symmetric SKIN: Normal hydration no rash or ulceration CNS: Cranial nerves 2-12 grossly intact no focal lateralizing neurologic deficit   Labs & Imaging No results found for this or any previous visit (from the past 48 hour(s)). No results found.    Assessment Present on Admission:  .Renal failure .Diabetes mellitus .Anemia of chronic disease   PLAN: Alexandra [name] is aware of her admission, and has written dialysis orders. This is a routine admission for [name]. She has no complaints. I will continue her home medications. Dialysis as per her nephrologist.   Other plans as per orders.    Alexandra [name] 08/27/2011, 10:22 PM

## 2011-08-27 NOTE — ED Notes (Signed)
Admitting MD at BS.  

## 2011-08-28 LAB — RENAL FUNCTION PANEL
Albumin: 3.5 g/dL (ref 3.5–5.2)
GFR calc Af Amer: 5 mL/min — ABNORMAL LOW (ref >90)
GFR calc non Af Amer: 5 mL/min — ABNORMAL LOW (ref >90)
Phosphorus: 3.3 mg/dL (ref 2.3–4.6)
Potassium: 4.1 mEq/L (ref 3.5–5.1)
Sodium: 142 mEq/L (ref 135–145)

## 2011-08-28 MED ORDER — PARICALCITOL 5 MCG/ML IV SOLN: 2.0000 ug | Freq: Once | INTRAVENOUS | Status: DC

## 2011-08-28 MED ORDER — CLONIDINE HCL 0.2 MG PO TABS
0.2000 mg | ORAL_TABLET | Freq: Once | ORAL | Status: AC
  Administered 2011-08-28: 0.2 mg via ORAL
  Filled 2011-08-28: qty 1

## 2011-08-28 MED ORDER — METOPROLOL TARTRATE 100 MG PO TABS
100.0000 mg | ORAL_TABLET | Freq: Once | ORAL | Status: AC
  Administered 2011-08-28: 100 mg via ORAL
  Filled 2011-08-28: qty 1

## 2011-08-28 NOTE — ED Provider Notes (Signed)
Medical screening examination/treatment/procedure(s) were performed by non-physician practitioner and as supervising physician I was immediately available for consultation/collaboration.   Gerhard Munch, MD 08/28/11 0000

## 2011-08-30 ENCOUNTER — Encounter (HOSPITAL_COMMUNITY): Payer: Self-pay | Admitting: Emergency Medicine

## 2011-08-30 ENCOUNTER — Observation Stay (HOSPITAL_COMMUNITY)
Admission: EM | Admit: 2011-08-30 | Discharge: 2011-08-31 | Disposition: A | Discharge status: Home / Self Care | Payer: Medicare HMO | Attending: Emergency Medicine | Admitting: Emergency Medicine

## 2011-08-30 DIAGNOSIS — I10 Essential (primary) hypertension: Secondary | ICD-10-CM | POA: Diagnosis present

## 2011-08-30 DIAGNOSIS — E1122 Type 2 diabetes mellitus with diabetic chronic kidney disease: Secondary | ICD-10-CM | POA: Diagnosis present

## 2011-08-30 DIAGNOSIS — N186 End stage renal disease: Secondary | ICD-10-CM

## 2011-08-30 DIAGNOSIS — I120 Hypertensive chronic kidney disease with stage 5 chronic kidney disease or end stage renal disease: Secondary | ICD-10-CM | POA: Insufficient documentation

## 2011-08-30 DIAGNOSIS — E119 Type 2 diabetes mellitus without complications: Secondary | ICD-10-CM | POA: Insufficient documentation

## 2011-08-30 DIAGNOSIS — N189 Chronic kidney disease, unspecified: Secondary | ICD-10-CM | POA: Diagnosis present

## 2011-08-30 DIAGNOSIS — Z992 Dependence on renal dialysis: Secondary | ICD-10-CM | POA: Insufficient documentation

## 2011-08-30 DIAGNOSIS — D638 Anemia in other chronic diseases classified elsewhere: Secondary | ICD-10-CM | POA: Insufficient documentation

## 2011-08-30 MED ORDER — SODIUM CHLORIDE 0.9 % IV SOLN: 100.0000 mL | INTRAVENOUS | Status: DC | PRN

## 2011-08-30 MED ORDER — NEPRO/CARBSTEADY PO LIQD
237.0000 mL | ORAL | Status: DC | PRN
  Filled 2011-08-30: qty 237

## 2011-08-30 MED ORDER — HEPARIN SODIUM (PORCINE) 1000 UNIT/ML DIALYSIS
1000.0000 [IU] | INTRAMUSCULAR | Status: DC | PRN
  Filled 2011-08-30: qty 1

## 2011-08-30 MED ORDER — HEPARIN SODIUM (PORCINE) 1000 UNIT/ML DIALYSIS
1200.0000 [IU] | INTRAMUSCULAR | Status: DC | PRN
  Filled 2011-08-30: qty 2

## 2011-08-30 MED ORDER — ALTEPLASE 2 MG IJ SOLR
2.0000 mg | Freq: Once | INTRAMUSCULAR | Status: AC | PRN
  Filled 2011-08-30: qty 2

## 2011-08-30 NOTE — ED Notes (Signed)
PT. IS HERE FOR HER ROUTINE HEMODIALYSIS , PT. STATES HER BLOOD PRESSURE IS [facility] BECAUSE SHE FORGOT TO TAKE HER LOSARTAN TODAY.

## 2011-08-31 ENCOUNTER — Encounter (HOSPITAL_COMMUNITY): Payer: Self-pay | Admitting: Internal Medicine

## 2011-08-31 ENCOUNTER — Observation Stay (HOSPITAL_COMMUNITY): Payer: Medicare HMO

## 2011-08-31 DIAGNOSIS — N186 End stage renal disease: Secondary | ICD-10-CM

## 2011-08-31 DIAGNOSIS — R112 Nausea with vomiting, unspecified: Secondary | ICD-10-CM

## 2011-08-31 DIAGNOSIS — E119 Type 2 diabetes mellitus without complications: Secondary | ICD-10-CM

## 2011-08-31 DIAGNOSIS — I10 Essential (primary) hypertension: Secondary | ICD-10-CM

## 2011-08-31 LAB — RENAL FUNCTION PANEL
Albumin: 3.3 g/dL — ABNORMAL LOW (ref 3.5–5.2)
BUN: 76 mg/dL — ABNORMAL HIGH (ref 6–23)
Chloride: 98 mEq/L (ref 96–112)
GFR calc Af Amer: 4 mL/min — ABNORMAL LOW (ref >90)
Glucose, Bld: 180 mg/dL — ABNORMAL HIGH (ref 70–99)
Phosphorus: 4 mg/dL (ref 2.3–4.6)
Potassium: 4.2 mEq/L (ref 3.5–5.1)
Sodium: 138 mEq/L (ref 135–145)

## 2011-08-31 LAB — CBC
HCT: 31.1 % — ABNORMAL LOW (ref 36.0–46.0)
Hemoglobin: 9.9 g/dL — ABNORMAL LOW (ref 12.0–15.0)
MCH: 24.1 pg — ABNORMAL LOW (ref 26.0–34.0)
MCHC: 31.8 g/dL (ref 30.0–36.0)
MCV: 75.9 fL — ABNORMAL LOW (ref 78.0–100.0)
Platelets: 187 K/uL (ref 150–400)
RBC: 4.1 MIL/uL (ref 3.87–5.11)
RDW: 19.7 % — ABNORMAL HIGH (ref 11.5–15.5)
WBC: 6.8 K/uL (ref 4.0–10.5)

## 2011-08-31 LAB — DIFFERENTIAL
Basophils Absolute: 0 K/uL (ref 0.0–0.1)
Basophils Relative: 1 % (ref 0–1)
Eosinophils Absolute: 0.2 K/uL (ref 0.0–0.7)
Eosinophils Relative: 3 % (ref 0–5)
Lymphocytes Relative: 36 % (ref 12–46)
Lymphs Abs: 2.4 K/uL (ref 0.7–4.0)
Monocytes Absolute: 0.6 K/uL (ref 0.1–1.0)
Monocytes Relative: 9 % (ref 3–12)
Neutro Abs: 3.5 K/uL (ref 1.7–7.7)
Neutrophils Relative %: 51 % (ref 43–77)

## 2011-08-31 LAB — GLUCOSE, CAPILLARY: Glucose-Capillary: 104 mg/dL — ABNORMAL HIGH (ref 70–99)

## 2011-08-31 MED ORDER — PARICALCITOL 5 MCG/ML IV SOLN
1.0000 ug | Freq: Once | INTRAVENOUS | Status: AC
  Administered 2011-08-31: 1 ug via INTRAVENOUS
  Filled 2011-08-31: qty 0.2

## 2011-08-31 MED ORDER — PARICALCITOL 5 MCG/ML IV SOLN
INTRAVENOUS | Status: AC
  Administered 2011-08-31: 1 ug via INTRAVENOUS
  Filled 2011-08-31: qty 1

## 2011-08-31 NOTE — ED Notes (Signed)
Patient here for regular dialysis.  Patient is CAOx3, NAD, waiting for dialysis.  Dialysis RN Delorise Jackson notified of patient's arrival to ED.  Awaiting ED eval and [facility] hospitalist admission orders.

## 2011-08-31 NOTE — ED Notes (Signed)
Patient taken by wheelchair to dialysis.

## 2011-08-31 NOTE — ED Provider Notes (Signed)
History     CSN: [id number]  Arrival date & time 08/30/11  2253   First MD Initiated Contact with Patient 08/31/11 0039      No chief complaint on file.   (Consider location/radiation/quality/duration/timing/severity/associated sxs/prior treatment) HPI Comments: Pt with ESRD - states that she is here for routine dialysis, she has no complaints including no chest pain shortness of breath fevers, or nausea.  The history is provided by the patient and medical records.    Past Medical History  Diagnosis Date  . Chronic kidney disease     esrd  . Diabetes mellitus   . Hyperlipidemia   . Hypertension   . Renal disorder   . Dialysis care     tues, thurs, sat  . 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 Carotid endarterectomy 2002    left  . Btl   . Rectal fissurectomy   . Av fistula placement 10/30/2010    right brachiocephalic   . Fistulogram     Family History  Problem Relation Age of Onset  . COPD Mother   . Kidney disease Mother   . Heart disease Father   . Lymphoma Son     History  Substance Use Topics  . Smoking status: Current Everyday Smoker -- 1.0 packs/day for 50 years    Types: Cigarettes  . Smokeless tobacco: Never Used  . Alcohol Use: No    OB History    Grav Para Term Preterm Abortions TAB SAB Ect Mult Living                  Review of Systems  All other systems reviewed and are negative.    Allergies  Codeine; Epinephrine; and Percocet  Home Medications   Current Outpatient Rx  Name Route Sig Dispense Refill  . ALLOPURINOL 100 MG PO TABS Oral Take 100 mg by mouth daily.      Marland Kitchen AMLODIPINE BESYLATE 5 MG PO TABS Oral Take 10 mg by mouth daily.    Marland Kitchen CALCIUM ACETATE 667 MG PO CAPS Oral Take 667 mg by mouth 4 (four) times daily.     Marland Kitchen CLONIDINE HCL 0.2 MG PO TABS Oral Take 0.2 mg by mouth at bedtime.    Marland Kitchen COENZYME Q10 150 MG PO CAPS Oral Take 300 mg by mouth daily.     Marland Kitchen DOCUSATE SODIUM 100 MG PO CAPS  Oral Take 200 mg by mouth at bedtime.      Marland Kitchen HYDRALAZINE HCL 25 MG PO TABS Oral Take 50 mg by mouth 3 (three) times daily.     . L-METHYLFOLATE-B6-B12 3-35-2 MG PO TABS Oral Take 1 tablet by mouth 2 (two) times daily.     Marland Kitchen LOSARTAN POTASSIUM 50 MG PO TABS Oral Take 100 mg by mouth at bedtime.     Marland Kitchen METOPROLOL SUCCINATE ER 50 MG PO TB24 Oral Take 50 mg by mouth at bedtime. Does not take on dialysis days Monday Wednesday and Friday    . RENA-VITE PO TABS Oral Take 1 tablet by mouth daily.      Marland Kitchen REPAGLINIDE 0.5 MG PO TABS Oral Take 0.5 mg by mouth 3 (three) times daily before meals.      . COLCHICINE 0.6 MG PO TABS Oral Take 0.6 mg by mouth daily as needed. For gout.      BP 199/72  Pulse 76  Temp 97.3 F (36.3 C) (Oral)  Resp 22  Wt 127  lb 10.3 oz (57.9 kg)  SpO2 92%  Physical Exam  Nursing note and vitals reviewed. Constitutional: She appears well-developed and well-nourished. No distress.  HENT:  Head: Normocephalic and atraumatic.  Mouth/Throat: Oropharynx is clear and moist. No oropharyngeal exudate.  Eyes: Conjunctivae and EOM are normal. Pupils are equal, round, and reactive to light. Right eye exhibits no discharge. Left eye exhibits no discharge. No scleral icterus.  Neck: Normal range of motion. Neck supple. No JVD present. No thyromegaly present.  Cardiovascular: Normal rate, regular rhythm, normal heart sounds and intact distal pulses.  Exam reveals no gallop and no friction rub.   No murmur heard. Pulmonary/Chest: Effort normal and breath sounds normal. No respiratory distress. She has no wheezes. She has no rales.  Abdominal: Soft. Bowel sounds are normal. She exhibits no distension and no mass. There is no tenderness.  Musculoskeletal: Normal range of motion. She exhibits no edema and no tenderness.  Lymphadenopathy:    She has no cervical adenopathy.  Neurological: She is alert. Coordination normal.  Skin: Skin is warm and dry. No rash noted. No erythema.    Psychiatric: She has a normal mood and affect. Her behavior is normal.    ED Course  Procedures (including critical care time)  Labs Reviewed  CBC - Abnormal; Notable for the following:    Hemoglobin 9.9 (*)     HCT 31.1 (*)     MCV 75.9 (*)     MCH 24.1 (*)     RDW 19.7 (*)     All other components within normal limits  RENAL FUNCTION PANEL - Abnormal; Notable for the following:    Glucose, Bld 180 (*)     BUN 76 (*)     Creatinine, Ser 11.18 (*)     Albumin 3.3 (*)     GFR calc non Af Amer 3 (*)     GFR calc Af Amer 4 (*)     All other components within normal limits  DIFFERENTIAL  LAB REPORT - SCANNED   No results found.   1. ESRD (end stage renal disease)       MDM  Patient is well-appearing, vital signs at this time show that she is 3 significantly hypertensive at 226/66, states that she forgot to take her blood pressure medication this evening. Otherwise she is not tachycardic, has no focal neurologic deficits, no chest pain or shortness of breath and came to to dialysis at this time.  D/w hospitalist - will admit      Vida Roller, MD 08/31/11 [phone]

## 2011-08-31 NOTE — H&P (Signed)
Alexandra [name] is an 66 y.o. female.   Chief Complaint: Shortness of breath HPI: 66 year-old female with known end-stage renal disease who has been coming in almost 2 times a week for her hemodialysis. She is here today with elevated blood pressure requiring hemodialysis due to shortness of breath. Denied chest pain. No nausea vomiting or diarrhea. She has been taking medications apparently. Patient is still waiting to have outpatient dialysis accepted in the center.  Past Medical History  Diagnosis Date  . Chronic kidney disease     esrd  . Diabetes mellitus   . Hyperlipidemia   . Hypertension   . Renal disorder   . Dialysis care     tues, thurs, sat  . 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 Carotid endarterectomy 2002    left  . Btl   . Rectal fissurectomy   . Av fistula placement 10/30/2010    right brachiocephalic   . Fistulogram     Family History  Problem Relation Age of Onset  . COPD Mother   . Kidney disease Mother   . Heart disease Father   . Lymphoma Son    Social History:  reports that she has been smoking Cigarettes.  She has a 50 pack-year smoking history. She has never used smokeless tobacco. She reports that she does not drink alcohol or use illicit drugs.  Allergies:  Allergies  Allergen Reactions  . Codeine Other (See Comments)    Passes out  . Epinephrine Other (See Comments)    Tachycardia, diaphoresis, syncope  . Percocet (Oxycodone-Acetaminophen) Other (See Comments)    Passes  out     (Not in a hospital admission)  Results for orders placed during the hospital encounter of 08/30/11 (from the past 48 hour(s))  CBC     Status: Abnormal   Collection Time   08/31/11  1:40 AM      Component Value Range Comment   WBC 6.8  4.0 - 10.5 K/uL    RBC 4.10  3.87 - 5.11 MIL/uL    Hemoglobin 9.9 (*) 12.0 - 15.0 g/dL    HCT 16.1 (*) 09.6 - 46.0 %    MCV 75.9 (*) 78.0 - 100.0 fL    MCH 24.1 (*) 26.0 - 34.0 pg      MCHC 31.8  30.0 - 36.0 g/dL    RDW 04.5 (*) 40.9 - 15.5 %    Platelets 187  150 - 400 K/uL   DIFFERENTIAL     Status: Normal   Collection Time   08/31/11  1:40 AM      Component Value Range Comment   Neutrophils Relative 51  43 - 77 %    Neutro Abs 3.5  1.7 - 7.7 K/uL    Lymphocytes Relative 36  12 - 46 %    Lymphs Abs 2.4  0.7 - 4.0 K/uL    Monocytes Relative 9  3 - 12 %    Monocytes Absolute 0.6  0.1 - 1.0 K/uL    Eosinophils Relative 3  0 - 5 %    Eosinophils Absolute 0.2  0.0 - 0.7 K/uL    Basophils Relative 1  0 - 1 %    Basophils Absolute 0.0  0.0 - 0.1 K/uL   RENAL FUNCTION PANEL     Status: Abnormal   Collection Time   08/31/11  1:40 AM      Component Value Range Comment  Sodium 138  135 - 145 mEq/L    Potassium 4.2  3.5 - 5.1 mEq/L    Chloride 98  96 - 112 mEq/L    CO2 23  19 - 32 mEq/L    Glucose, Bld 180 (*) 70 - 99 mg/dL    BUN 76 (*) 6 - 23 mg/dL    Creatinine, Ser 82.95 (*) 0.50 - 1.10 mg/dL    Calcium 9.3  8.4 - 62.1 mg/dL    Phosphorus 4.0  2.3 - 4.6 mg/dL    Albumin 3.3 (*) 3.5 - 5.2 g/dL    GFR calc non Af Amer 3 (*) >90 mL/min    GFR calc Af Amer 4 (*) >90 mL/min    No results found.  Review of Systems  Respiratory: Positive for shortness of breath.   All other systems reviewed and are negative.    Blood pressure 177/69, pulse 72, temperature 97.6 F (36.4 C), temperature source Oral, resp. rate 20, weight 59.2 kg (130 lb 8.2 oz), SpO2 92.00%. Physical Exam  Constitutional: She is oriented to person, place, and time. She appears well-developed and well-nourished.  HENT:  Head: Normocephalic and atraumatic.  Right Ear: External ear normal.  Left Ear: External ear normal.  Nose: Nose normal.  Mouth/Throat: Oropharynx is clear and moist.  Eyes: Conjunctivae and EOM are normal. Pupils are equal, round, and reactive to light.  Neck: Normal range of motion. Neck supple.  Cardiovascular: Normal rate, regular rhythm, normal heart sounds and intact  distal pulses.   Respiratory: Effort normal and breath sounds normal.  GI: Soft. Bowel sounds are normal.  Musculoskeletal: Normal range of motion.  Neurological: She is alert and oriented to person, place, and time. She has normal reflexes.  Skin: Skin is warm and dry.  Psychiatric: She has a normal mood and affect. Her behavior is normal. Judgment and thought content normal.     Assessment/Plan 66 year old female on hemodialysis here with shortness of breath and chest pain probably mild fluid overload Plan #1: Essentially no disease: Patient will be dialyzed tonight or early morning. Was dialyzed is more than likely going to leave. #2 Diabetes: Continue sliding scale insulin #3 hypertension: Continue his medications #4 anemia of chronic disease: Watch H&H closely. Continue per nephrology  [facility] 08/31/2011, 5:43 AM

## 2011-08-31 NOTE — ED Notes (Signed)
Dialysis RN notified of patient ready to come to dialysis.

## 2011-09-01 ENCOUNTER — Encounter (HOSPITAL_COMMUNITY): Payer: Self-pay | Admitting: Emergency Medicine

## 2011-09-01 ENCOUNTER — Other Ambulatory Visit (HOSPITAL_COMMUNITY): Payer: Self-pay | Admitting: Hematology

## 2011-09-01 DIAGNOSIS — E119 Type 2 diabetes mellitus without complications: Secondary | ICD-10-CM | POA: Insufficient documentation

## 2011-09-01 DIAGNOSIS — E785 Hyperlipidemia, unspecified: Secondary | ICD-10-CM | POA: Insufficient documentation

## 2011-09-01 DIAGNOSIS — I120 Hypertensive chronic kidney disease with stage 5 chronic kidney disease or end stage renal disease: Secondary | ICD-10-CM | POA: Insufficient documentation

## 2011-09-01 DIAGNOSIS — Z992 Dependence on renal dialysis: Principal | ICD-10-CM | POA: Insufficient documentation

## 2011-09-01 DIAGNOSIS — N186 End stage renal disease: Secondary | ICD-10-CM | POA: Insufficient documentation

## 2011-09-01 NOTE — ED Notes (Signed)
PT. IS HERE FOR HER ROUTINE HEMODIALYSIS , NO OTHER COMPLAINTS , RESPIRATIONS UNLABORED , NO PAIN .

## 2011-09-02 ENCOUNTER — Encounter (HOSPITAL_COMMUNITY): Payer: Self-pay | Admitting: Internal Medicine

## 2011-09-02 ENCOUNTER — Observation Stay (HOSPITAL_COMMUNITY)
Admission: EM | Admit: 2011-09-02 | Discharge: 2011-09-02 | Disposition: A | Discharge status: Hospice | Payer: Medicare HMO | Attending: Emergency Medicine | Admitting: Emergency Medicine

## 2011-09-02 ENCOUNTER — Observation Stay (HOSPITAL_COMMUNITY): Payer: Medicare HMO

## 2011-09-02 DIAGNOSIS — N186 End stage renal disease: Secondary | ICD-10-CM

## 2011-09-02 DIAGNOSIS — E119 Type 2 diabetes mellitus without complications: Secondary | ICD-10-CM

## 2011-09-02 DIAGNOSIS — E785 Hyperlipidemia, unspecified: Secondary | ICD-10-CM | POA: Diagnosis present

## 2011-09-02 DIAGNOSIS — E1122 Type 2 diabetes mellitus with diabetic chronic kidney disease: Secondary | ICD-10-CM | POA: Diagnosis present

## 2011-09-02 DIAGNOSIS — I10 Essential (primary) hypertension: Secondary | ICD-10-CM

## 2011-09-02 LAB — DIFFERENTIAL
Eosinophils Relative: 4 % (ref 0–5)
Lymphocytes Relative: 42 % (ref 12–46)
Lymphs Abs: 2.8 10*3/uL (ref 0.7–4.0)
Monocytes Absolute: 0.8 10*3/uL (ref 0.1–1.0)
Monocytes Relative: 13 % — ABNORMAL HIGH (ref 3–12)

## 2011-09-02 LAB — GLUCOSE, CAPILLARY: Glucose-Capillary: 102 mg/dL — ABNORMAL HIGH (ref 70–99)

## 2011-09-02 LAB — RENAL FUNCTION PANEL
BUN: 52 mg/dL — ABNORMAL HIGH (ref 6–23)
CO2: 27 mEq/L (ref 19–32)
Calcium: 9.4 mg/dL (ref 8.4–10.5)
Creatinine, Ser: 7.97 mg/dL — ABNORMAL HIGH (ref 0.50–1.10)
GFR calc Af Amer: 5 mL/min — ABNORMAL LOW (ref >90)
Glucose, Bld: 82 mg/dL (ref 70–99)
Sodium: 137 mEq/L (ref 135–145)

## 2011-09-02 LAB — CBC
HCT: 31.6 % — ABNORMAL LOW (ref 36.0–46.0)
Hemoglobin: 10.1 g/dL — ABNORMAL LOW (ref 12.0–15.0)
MCV: 74.4 fL — ABNORMAL LOW (ref 78.0–100.0)
RBC: 4.25 MIL/uL (ref 3.87–5.11)
WBC: 6.7 10*3/uL (ref 4.0–10.5)

## 2011-09-02 MED ORDER — PARICALCITOL 5 MCG/ML IV SOLN
2.0000 ug | Freq: Once | INTRAVENOUS | Status: AC
  Administered 2011-09-02: 2 ug via INTRAVENOUS

## 2011-09-02 MED ORDER — PARICALCITOL 5 MCG/ML IV SOLN
INTRAVENOUS | Status: AC
  Administered 2011-09-02: 2 ug via INTRAVENOUS
  Filled 2011-09-02: qty 1

## 2011-09-02 NOTE — ED Provider Notes (Signed)
History     CSN: [id number]  Arrival date & time 09/01/11  2135   First MD Initiated Contact with Patient 09/02/11 0111      Chief Complaint  Patient presents with  . Vascular Access Problem    (Consider location/radiation/quality/duration/timing/severity/associated sxs/prior treatment) HPI Comments: Patient presents for dialysis, states she has no other medical complaints at this time  The history is provided by the patient.    Past Medical History  Diagnosis Date  . Chronic kidney disease     esrd  . Diabetes mellitus   . Hyperlipidemia   . Hypertension   . Renal disorder   . Dialysis care     tues, thurs, sat  . 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 Carotid endarterectomy 2002    left  . Btl   . Rectal fissurectomy   . Av fistula placement 10/30/2010    right brachiocephalic   . Fistulogram     Family History  Problem Relation Age of Onset  . COPD Mother   . Kidney disease Mother   . Heart disease Father   . Lymphoma Son     History  Substance Use Topics  . Smoking status: Current Everyday Smoker -- 1.0 packs/day for 50 years    Types: Cigarettes  . Smokeless tobacco: Never Used  . Alcohol Use: No    OB History    Grav Para Term Preterm Abortions TAB SAB Ect Mult Living                  Review of Systems  All other systems reviewed and are negative.    Allergies  Codeine; Epinephrine; and Percocet  Home Medications   Current Outpatient Rx  Name Route Sig Dispense Refill  . ALLOPURINOL 100 MG PO TABS Oral Take 100 mg by mouth daily.      Marland Kitchen AMLODIPINE BESYLATE 5 MG PO TABS Oral Take 10 mg by mouth daily.    Marland Kitchen CALCIUM ACETATE 667 MG PO CAPS Oral Take 667 mg by mouth 4 (four) times daily.     Marland Kitchen CLONIDINE HCL 0.2 MG PO TABS Oral Take 0.2 mg by mouth at bedtime.    Marland Kitchen COENZYME Q10 150 MG PO CAPS Oral Take 300 mg by mouth daily.     . COLCHICINE 0.6 MG PO TABS Oral Take 0.6 mg by mouth daily as needed.  For gout.    Marland Kitchen DOCUSATE SODIUM 100 MG PO CAPS Oral Take 200 mg by mouth at bedtime.      Marland Kitchen HYDRALAZINE HCL 25 MG PO TABS Oral Take 50 mg by mouth 3 (three) times daily.     . L-METHYLFOLATE-B6-B12 3-35-2 MG PO TABS Oral Take 1 tablet by mouth 2 (two) times daily.     Marland Kitchen LOSARTAN POTASSIUM 50 MG PO TABS Oral Take 100 mg by mouth at bedtime.     Marland Kitchen METOPROLOL SUCCINATE ER 50 MG PO TB24 Oral Take 50 mg by mouth at bedtime. Does not take on dialysis days Monday Wednesday and Friday    . RENA-VITE PO TABS Oral Take 1 tablet by mouth daily.      Marland Kitchen REPAGLINIDE 0.5 MG PO TABS Oral Take 0.5 mg by mouth 3 (three) times daily before meals.        BP 191/75  Pulse 79  Temp 97.2 F (36.2 C) (Oral)  Resp 20  Wt 125 lb 14.1 oz (57.1 kg)  SpO2 98%  Physical Exam  Constitutional: She appears well-developed and well-nourished. No distress.  Cardiovascular: Normal rate and regular rhythm.   No murmur heard. Pulmonary/Chest: Effort normal and breath sounds normal. She has no rales.  Neurological:       Normal mentation, normal speech  Skin: Skin is warm and dry. No rash noted. No erythema.    ED Course  Procedures (including critical care time)  Labs Reviewed  GLUCOSE, CAPILLARY - Abnormal; Notable for the following:    Glucose-Capillary 102 (*)     All other components within normal limits  CBC - Abnormal; Notable for the following:    Hemoglobin 10.1 (*)     HCT 31.6 (*)     MCV 74.4 (*)     MCH 23.8 (*)     RDW 19.3 (*)     All other components within normal limits  DIFFERENTIAL - Abnormal; Notable for the following:    Neutrophils Relative 41 (*)     Monocytes Relative 13 (*)     All other components within normal limits  RENAL FUNCTION PANEL - Abnormal; Notable for the following:    Chloride 95 (*)     BUN 52 (*)     Creatinine, Ser 7.97 (*)     GFR calc non Af Amer 5 (*)     GFR calc Af Amer 5 (*)     All other components within normal limits   No results found.   No  diagnosis found.    MDM  Patient has hypertension, better than it usually is, no acute distress, pending dialysis. [facility] to admit.        Vida Roller, MD 09/02/11 [phone]

## 2011-09-02 NOTE — ED Notes (Addendum)
CBG checked by EMT R Jama [id number]

## 2011-09-02 NOTE — ED Notes (Signed)
Patient moved to dialysis.

## 2011-09-02 NOTE — ED Notes (Signed)
Patient here for routine dialysis.  Patient is CAOx3, NAD, waiting for admission to dialysis unit.  Dr Ola Spurr has written orders.  EDP eval and [facility] eval needed.  MD aware.

## 2011-09-02 NOTE — H&P (Signed)
Alexandra [name] is an 66 y.o. female.   Chief Complaint: Needing dialysis HPI: 66 year old female with end-stage renal disease here for hemodialysis. She has been receiving that is at least 3 times a week he had. Patient has already been seen by her nephrologist Dr. Jeri Cos. She is still working with the hospital to have an outpatient facility for hemodialysis arranged. Has mild shortness of breath today but no chest pain no edema. Her blood sugar has been fine. She also has hyperlipidemia. Patient has no other complaints at this point.  Past Medical History  Diagnosis Date  . Chronic kidney disease     esrd  . Diabetes mellitus   . Hyperlipidemia   . Hypertension   . Renal disorder   . Dialysis care     tues, thurs, sat  . 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 Carotid endarterectomy 2002    left  . Btl   . Rectal fissurectomy   . Av fistula placement 10/30/2010    right brachiocephalic   . Fistulogram     Family History  Problem Relation Age of Onset  . COPD Mother   . Kidney disease Mother   . Heart disease Father   . Lymphoma Son    Social History:  reports that she has been smoking Cigarettes.  She has a 50 pack-year smoking history. She has never used smokeless tobacco. She reports that she does not drink alcohol or use illicit drugs.  Allergies:  Allergies  Allergen Reactions  . Codeine Other (See Comments)    Passes out  . Epinephrine Other (See Comments)    Tachycardia, diaphoresis, syncope  . Percocet (Oxycodone-Acetaminophen) Other (See Comments)    Passes  out     (Not in a hospital admission)  Results for orders placed during the hospital encounter of 09/02/11 (from the past 48 hour(s))  GLUCOSE, CAPILLARY     Status: Abnormal   Collection Time   09/02/11 12:59 AM      Component Value Range Comment   Glucose-Capillary 102 (*) 70 - 99 mg/dL   CBC     Status: Abnormal   Collection Time   09/02/11  2:25  AM      Component Value Range Comment   WBC 6.7  4.0 - 10.5 K/uL    RBC 4.25  3.87 - 5.11 MIL/uL    Hemoglobin 10.1 (*) 12.0 - 15.0 g/dL    HCT 16.1 (*) 09.6 - 46.0 %    MCV 74.4 (*) 78.0 - 100.0 fL    MCH 23.8 (*) 26.0 - 34.0 pg    MCHC 32.0  30.0 - 36.0 g/dL    RDW 04.5 (*) 40.9 - 15.5 %    Platelets 169  150 - 400 K/uL   DIFFERENTIAL     Status: Abnormal   Collection Time   09/02/11  2:25 AM      Component Value Range Comment   Neutrophils Relative 41 (*) 43 - 77 %    Neutro Abs 2.7  1.7 - 7.7 K/uL    Lymphocytes Relative 42  12 - 46 %    Lymphs Abs 2.8  0.7 - 4.0 K/uL    Monocytes Relative 13 (*) 3 - 12 %    Monocytes Absolute 0.8  0.1 - 1.0 K/uL    Eosinophils Relative 4  0 - 5 %    Eosinophils Absolute 0.2  0.0 - 0.7 K/uL  Basophils Relative 1  0 - 1 %    Basophils Absolute 0.1  0.0 - 0.1 K/uL   RENAL FUNCTION PANEL     Status: Abnormal   Collection Time   09/02/11  2:25 AM      Component Value Range Comment   Sodium 137  135 - 145 mEq/L    Potassium 3.7  3.5 - 5.1 mEq/L    Chloride 95 (*) 96 - 112 mEq/L    CO2 27  19 - 32 mEq/L    Glucose, Bld 82  70 - 99 mg/dL    BUN 52 (*) 6 - 23 mg/dL    Creatinine, Ser 4.09 (*) 0.50 - 1.10 mg/dL    Calcium 9.4  8.4 - 81.1 mg/dL    Phosphorus 4.5  2.3 - 4.6 mg/dL    Albumin 3.5  3.5 - 5.2 g/dL    GFR calc non Af Amer 5 (*) >90 mL/min    GFR calc Af Amer 5 (*) >90 mL/min    No results found.  Review of Systems  Respiratory: Positive for shortness of breath.   All other systems reviewed and are negative.    Blood pressure 196/64, pulse 83, temperature 97.9 F (36.6 C), temperature source Oral, resp. rate 16, weight 58.6 kg (129 lb 3 oz), SpO2 98.00%. Physical Exam  Constitutional: She is oriented to person, place, and time. She appears well-developed and well-nourished.  HENT:  Head: Normocephalic and atraumatic.  Right Ear: External ear normal.  Left Ear: External ear normal.  Nose: Nose normal.  Mouth/Throat:  Oropharynx is clear and moist.  Eyes: Conjunctivae and EOM are normal. Pupils are equal, round, and reactive to light.  Neck: Normal range of motion. Neck supple.  Cardiovascular: Normal rate, regular rhythm, normal heart sounds and intact distal pulses.   Respiratory: Effort normal. No respiratory distress. She has wheezes. She has no rales. She exhibits no tenderness.  GI: Soft. Bowel sounds are normal.  Musculoskeletal: Normal range of motion. She exhibits no edema and no tenderness.  Neurological: She is alert and oriented to person, place, and time.  Skin: Skin is warm and dry.  Psychiatric: She has a normal mood and affect. Her behavior is normal. Thought content normal.     Assessment/Plan Mrs. Alexandra [name] is here for her regular hemodialysis. She has some mild shortness of breath or otherwise no significant evidence of major fluid overload. Plan #1 end-stage renal disease: Patient will be admitted for high hemodialysis. She'll be on observation until then and then discharged home.  #2 hypertension: Her blood pressure is elevated however this should be addressed with her hemodialysis. #3 diabetes: We'll continue his home medications and sliding scale insulin while in the hospital. #4 hyperlipidemia: Again continue his statins while in the hospital.  [facility] 09/02/2011, 5:39 AM

## 2011-09-03 ENCOUNTER — Observation Stay (HOSPITAL_COMMUNITY)
Admission: EM | Admit: 2011-09-03 | Discharge: 2011-09-04 | Disposition: A | Discharge status: Home / Self Care | Payer: Medicare HMO | Attending: Internal Medicine | Admitting: Internal Medicine

## 2011-09-03 ENCOUNTER — Encounter (HOSPITAL_COMMUNITY): Payer: Self-pay | Admitting: *Deleted

## 2011-09-03 DIAGNOSIS — E785 Hyperlipidemia, unspecified: Secondary | ICD-10-CM | POA: Insufficient documentation

## 2011-09-03 DIAGNOSIS — E1122 Type 2 diabetes mellitus with diabetic chronic kidney disease: Secondary | ICD-10-CM | POA: Diagnosis present

## 2011-09-03 DIAGNOSIS — E119 Type 2 diabetes mellitus without complications: Secondary | ICD-10-CM | POA: Insufficient documentation

## 2011-09-03 DIAGNOSIS — I120 Hypertensive chronic kidney disease with stage 5 chronic kidney disease or end stage renal disease: Secondary | ICD-10-CM | POA: Insufficient documentation

## 2011-09-03 DIAGNOSIS — Z992 Dependence on renal dialysis: Principal | ICD-10-CM | POA: Insufficient documentation

## 2011-09-03 DIAGNOSIS — N186 End stage renal disease: Secondary | ICD-10-CM

## 2011-09-03 NOTE — ED Notes (Signed)
Report given to receiving RN for exam room C28, pt moved to room, EDP and pt notified.

## 2011-09-03 NOTE — ED Notes (Signed)
Dr. Petra Kuba returned page for Dr. Bascom Levels.  Dr. Bascom Levels re-called again/contacted by another number.  Dr. Bascom Levels "will call HD orders into HD by phone, have [facility] admit".

## 2011-09-03 NOTE — ED Notes (Addendum)
Here for routine HD, denies acute sx, alert, NAD, calm, interactive, skin W&D, resps e/u, speaking in clear complete sentences, ambulatory, intermitantly sleeping in chair, easily arousable. Dr. Bascom Levels paged to triage RN (he has already called about pt). HD RN [facility] notified.

## 2011-09-04 ENCOUNTER — Observation Stay (HOSPITAL_COMMUNITY): Payer: Medicare HMO

## 2011-09-04 ENCOUNTER — Encounter (HOSPITAL_COMMUNITY): Payer: Self-pay | Admitting: Internal Medicine

## 2011-09-04 DIAGNOSIS — N186 End stage renal disease: Secondary | ICD-10-CM

## 2011-09-04 DIAGNOSIS — E119 Type 2 diabetes mellitus without complications: Secondary | ICD-10-CM

## 2011-09-04 DIAGNOSIS — I10 Essential (primary) hypertension: Secondary | ICD-10-CM

## 2011-09-04 LAB — RENAL FUNCTION PANEL
Albumin: 3.3 g/dL — ABNORMAL LOW (ref 3.5–5.2)
GFR calc Af Amer: 7 mL/min — ABNORMAL LOW (ref >90)
GFR calc non Af Amer: 6 mL/min — ABNORMAL LOW (ref >90)
Phosphorus: 3.3 mg/dL (ref 2.3–4.6)
Potassium: 4.2 mEq/L (ref 3.5–5.1)
Sodium: 137 mEq/L (ref 135–145)

## 2011-09-04 LAB — CBC
MCH: 23.5 pg — ABNORMAL LOW (ref 26.0–34.0)
MCHC: 31.5 g/dL (ref 30.0–36.0)
Platelets: 177 10*3/uL (ref 150–400)

## 2011-09-04 LAB — DIFFERENTIAL
Basophils Absolute: 0 10*3/uL (ref 0.0–0.1)
Basophils Relative: 1 % (ref 0–1)
Eosinophils Absolute: 0.2 10*3/uL (ref 0.0–0.7)
Neutrophils Relative %: 38 % — ABNORMAL LOW (ref 43–77)

## 2011-09-04 MED ORDER — SODIUM CHLORIDE 0.9 % IJ SOLN: 3.0000 mL | Freq: Two times a day (BID) | INTRAMUSCULAR | Status: DC

## 2011-09-04 MED ORDER — RENA-VITE PO TABS
1.0000 | ORAL_TABLET | Freq: Every day | ORAL | Status: DC
  Filled 2011-09-04: qty 1

## 2011-09-04 MED ORDER — AMLODIPINE BESYLATE 10 MG PO TABS
10.0000 mg | ORAL_TABLET | Freq: Every day | ORAL | Status: DC
  Filled 2011-09-04: qty 1

## 2011-09-04 MED ORDER — ALLOPURINOL 100 MG PO TABS
100.0000 mg | ORAL_TABLET | Freq: Every day | ORAL | Status: DC
  Filled 2011-09-04: qty 1

## 2011-09-04 MED ORDER — COLCHICINE 0.6 MG PO TABS
0.6000 mg | ORAL_TABLET | Freq: Two times a day (BID) | ORAL | Status: DC
  Filled 2011-09-04 (×2): qty 1

## 2011-09-04 MED ORDER — ONDANSETRON HCL 4 MG PO TABS: 4.0000 mg | ORAL_TABLET | Freq: Four times a day (QID) | ORAL | Status: DC | PRN

## 2011-09-04 MED ORDER — ENOXAPARIN SODIUM 30 MG/0.3ML ~~LOC~~ SOLN
30.0000 mg | SUBCUTANEOUS | Status: DC
  Filled 2011-09-04 (×2): qty 0.3

## 2011-09-04 MED ORDER — DARBEPOETIN ALFA-POLYSORBATE 100 MCG/0.5ML IJ SOLN
INTRAMUSCULAR | Status: AC
  Administered 2011-09-04: 100 ug via INTRAVENOUS
  Filled 2011-09-04: qty 0.5

## 2011-09-04 MED ORDER — SODIUM CHLORIDE 0.9 % IJ SOLN: 3.0000 mL | INTRAMUSCULAR | Status: DC | PRN

## 2011-09-04 MED ORDER — METOPROLOL SUCCINATE ER 50 MG PO TB24
50.0000 mg | ORAL_TABLET | Freq: Two times a day (BID) | ORAL | Status: DC
  Filled 2011-09-04 (×2): qty 1

## 2011-09-04 MED ORDER — DOCUSATE SODIUM 100 MG PO CAPS: 200.0000 mg | ORAL_CAPSULE | Freq: Every day | ORAL | Status: DC

## 2011-09-04 MED ORDER — CALCIUM ACETATE 667 MG PO CAPS
667.0000 mg | ORAL_CAPSULE | Freq: Four times a day (QID) | ORAL | Status: DC
  Filled 2011-09-04 (×4): qty 1

## 2011-09-04 MED ORDER — LOSARTAN POTASSIUM 50 MG PO TABS
100.0000 mg | ORAL_TABLET | Freq: Every day | ORAL | Status: DC
  Filled 2011-09-04: qty 2

## 2011-09-04 MED ORDER — ONDANSETRON HCL 4 MG/2ML IJ SOLN: 4.0000 mg | Freq: Four times a day (QID) | INTRAMUSCULAR | Status: DC | PRN

## 2011-09-04 MED ORDER — PARICALCITOL 5 MCG/ML IV SOLN
INTRAVENOUS | Status: AC
  Administered 2011-09-04: 1 ug via INTRAVENOUS
  Filled 2011-09-04: qty 1

## 2011-09-04 MED ORDER — DARBEPOETIN ALFA-POLYSORBATE 100 MCG/0.5ML IJ SOLN
100.0000 ug | Freq: Once | INTRAMUSCULAR | Status: AC
  Administered 2011-09-04: 100 ug via INTRAVENOUS

## 2011-09-04 MED ORDER — HYDRALAZINE HCL 50 MG PO TABS
50.0000 mg | ORAL_TABLET | Freq: Three times a day (TID) | ORAL | Status: DC
  Filled 2011-09-04 (×3): qty 1

## 2011-09-04 MED ORDER — SODIUM CHLORIDE 0.9 % IV SOLN: 250.0000 mL | INTRAVENOUS | Status: DC | PRN

## 2011-09-04 MED ORDER — PARICALCITOL 5 MCG/ML IV SOLN
1.0000 ug | Freq: Once | INTRAVENOUS | Status: AC
  Administered 2011-09-04: 1 ug via INTRAVENOUS

## 2011-09-04 MED ORDER — CLONIDINE HCL 0.2 MG PO TABS
0.2000 mg | ORAL_TABLET | Freq: Every day | ORAL | Status: DC
  Filled 2011-09-04: qty 1

## 2011-09-04 MED ORDER — REPAGLINIDE 0.5 MG PO TABS
0.5000 mg | ORAL_TABLET | Freq: Three times a day (TID) | ORAL | Status: DC
  Filled 2011-09-04 (×4): qty 1

## 2011-09-04 NOTE — Progress Notes (Signed)
Pt. Makes complaint that she had to call out twice to get a response from staff. Pt. Requested to turn uf on post cramp relief. RN and Dialysis Tech within 15 feet of feet with wall barrier, visible through glass at bay  7. Call bell request provided to patient per request.

## 2011-09-04 NOTE — ED Notes (Signed)
No answer in HD.

## 2011-09-04 NOTE — ED Notes (Signed)
Dialysis RN expressed concerns about patient admission orders, consulted with MD Mikeal Hawthorne that patient was to proceed to dialysis

## 2011-09-04 NOTE — ED Notes (Signed)
No answer in HD when called.

## 2011-09-04 NOTE — ED Notes (Signed)
Dialysis states they are ready for patient, pt will be transported via wheelchair.

## 2011-09-04 NOTE — ED Notes (Signed)
Attempted to call dialysis x1, will call again

## 2011-09-04 NOTE — ED Provider Notes (Signed)
History     CSN: [id number]  Arrival date & time 09/03/11  2152   First MD Initiated Contact with Patient 09/04/11 0014      Chief Complaint  Patient presents with  . Vascular Access Problem    (Consider location/radiation/quality/duration/timing/severity/associated sxs/prior treatment) HPI Comments: Patient presents for her routine dialysis. Orders were placed by Dr. Leretha Dykes. She has no complaints. Denies chest pain, shortness of breath, abdominal pain. Requested no additional orders be placed  The history is provided by the patient. No language interpreter was used.    Past Medical History  Diagnosis Date  . Chronic kidney disease     esrd  . Diabetes mellitus   . Hyperlipidemia   . Hypertension   . Renal disorder   . Dialysis care     tues, thurs, sat  . 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 Carotid endarterectomy 2002    left  . Btl   . Rectal fissurectomy   . Av fistula placement 10/30/2010    right brachiocephalic   . Fistulogram     Family History  Problem Relation Age of Onset  . COPD Mother   . Kidney disease Mother   . Heart disease Father   . Lymphoma Son     History  Substance Use Topics  . Smoking status: Current Everyday Smoker -- 1.0 packs/day for 50 years    Types: Cigarettes  . Smokeless tobacco: Never Used  . Alcohol Use: No    OB History    Grav Para Term Preterm Abortions TAB SAB Ect Mult Living                  Review of Systems  Constitutional: Negative for fever, activity change, appetite change and fatigue.  HENT: Negative for congestion, sore throat, rhinorrhea, neck pain and neck stiffness.   Respiratory: Negative for cough and shortness of breath.   Cardiovascular: Negative for chest pain and palpitations.  Gastrointestinal: Negative for nausea, vomiting and abdominal pain.  Genitourinary: Negative for dysuria, urgency, frequency and flank pain.  Musculoskeletal: Negative for  myalgias, back pain and arthralgias.  Neurological: Negative for dizziness, weakness, light-headedness, numbness and headaches.  All other systems reviewed and are negative.    Allergies  Codeine; Epinephrine; and Percocet  Home Medications   Current Outpatient Rx  Name Route Sig Dispense Refill  . ALLOPURINOL 100 MG PO TABS Oral Take 100 mg by mouth daily.      Marland Kitchen AMLODIPINE BESYLATE 5 MG PO TABS Oral Take 10 mg by mouth daily.    Marland Kitchen CALCIUM ACETATE 667 MG PO CAPS Oral Take 667 mg by mouth 4 (four) times daily.     Marland Kitchen CLONIDINE HCL 0.2 MG PO TABS Oral Take 0.2 mg by mouth at bedtime.    Marland Kitchen COENZYME Q10 150 MG PO CAPS Oral Take 300 mg by mouth daily.     . COLCHICINE 0.6 MG PO TABS Oral Take 0.6 mg by mouth daily as needed. For gout.    Marland Kitchen DOCUSATE SODIUM 100 MG PO CAPS Oral Take 200 mg by mouth at bedtime.      Marland Kitchen HYDRALAZINE HCL 25 MG PO TABS Oral Take 50 mg by mouth 3 (three) times daily.     . L-METHYLFOLATE-B6-B12 3-35-2 MG PO TABS Oral Take 1 tablet by mouth 2 (two) times daily.     Marland Kitchen LOSARTAN POTASSIUM 50 MG PO TABS Oral Take 100 mg by mouth  at bedtime.     Marland Kitchen METOPROLOL SUCCINATE ER 50 MG PO TB24 Oral Take 50 mg by mouth 2 (two) times daily. Does not take on dialysis days Monday Wednesday and Friday    . RENA-VITE PO TABS Oral Take 1 tablet by mouth daily.      Marland Kitchen REPAGLINIDE 0.5 MG PO TABS Oral Take 0.5 mg by mouth 3 (three) times daily before meals.        BP 202/71  Pulse 72  Temp 98 F (36.7 C) (Oral)  SpO2 99%  Physical Exam  Nursing note and vitals reviewed. Constitutional: She is oriented to person, place, and time. She appears well-developed and well-nourished. No distress.  HENT:  Head: Normocephalic and atraumatic.  Mouth/Throat: Oropharynx is clear and moist. No oropharyngeal exudate.  Eyes: Conjunctivae and EOM are normal. Pupils are equal, round, and reactive to light.  Neck: Normal range of motion. Neck supple.  Cardiovascular: Normal rate, regular rhythm,  normal heart sounds and intact distal pulses.  Exam reveals no gallop and no friction rub.   No murmur heard. Pulmonary/Chest: Effort normal and breath sounds normal. No respiratory distress. She exhibits no tenderness.  Abdominal: Soft. Bowel sounds are normal. There is no tenderness. There is no rebound and no guarding.  Musculoskeletal: Normal range of motion. She exhibits no edema and no tenderness.  Neurological: She is alert and oriented to person, place, and time. No cranial nerve deficit.  Skin: Skin is warm and dry. No rash noted.    ED Course  Procedures (including critical care time)  Labs Reviewed - No data to display No results found.   1. End stage renal disease   2. Hemodialysis patient       MDM  End stage renal disease on hemodialysis. The patient presents for her routine dialysis. She is well-known to this facility. Orders were placed by Dr. Leretha Dykes. Discussed with the [facility] hospitalist who will admit the patient        Dayton Bailiff, MD 09/04/11 [phone]

## 2011-09-04 NOTE — H&P (Signed)
Alexandra [name] is an 66 y.o. female.   Chief Complaint: Need dialysis HPI: 66 year-old female with end stage renal disease back again with hemodialysis request. No fever no shortness of breath no cough. She had a hemodialysis 2 days ago. She feels like she is having a little bit of fluid buildup. No nausea or vomiting no diarrhea. Her nephrologist as already written dialysis orders. She is. A diabetic and hypertensive. She is taking her medications as prescribed and she feels good. Alexandra [name] is still unable to get outpatient hemodialysis.     Past Medical History  Diagnosis Date  . Chronic kidney disease     esrd  . Diabetes mellitus   . Hyperlipidemia   . Hypertension   . Renal disorder   . Dialysis care     tues, thurs, sat  . 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 Carotid endarterectomy 2002    left  . Btl   . Rectal fissurectomy   . Av fistula placement 10/30/2010    right brachiocephalic   . Fistulogram     Family History  Problem Relation Age of Onset  . COPD Mother   . Kidney disease Mother   . Heart disease Father   . Lymphoma Son    Social History:  reports that she has been smoking Cigarettes.  She has a 50 pack-year smoking history. She has never used smokeless tobacco. She reports that she does not drink alcohol or use illicit drugs.  Allergies:  Allergies  Allergen Reactions  . Codeine Other (See Comments)    Passes out  . Epinephrine Other (See Comments)    Tachycardia, diaphoresis, syncope  . Percocet (Oxycodone-Acetaminophen) Other (See Comments)    Passes  out    Medications Prior to Admission  Medication Sig Dispense Refill  . allopurinol (ZYLOPRIM) 100 MG tablet Take 100 mg by mouth daily.        Marland Kitchen amLODipine (NORVASC) 5 MG tablet Take 10 mg by mouth daily.      . calcium acetate (PHOSLO) 667 MG capsule Take 667 mg by mouth 4 (four) times daily.       . cloNIDine (CATAPRES) 0.2 MG tablet Take 0.2 mg  by mouth at bedtime.      . Coenzyme Q10 150 MG CAPS Take 300 mg by mouth daily.       . colchicine 0.6 MG tablet Take 0.6 mg by mouth daily as needed. For gout.      . docusate sodium (COLACE) 100 MG capsule Take 200 mg by mouth at bedtime.        . hydrALAZINE (APRESOLINE) 25 MG tablet Take 50 mg by mouth 3 (three) times daily.       Marland Kitchen l-methylfolate-B6-B12 (METANX) 3-35-2 MG TABS Take 1 tablet by mouth 2 (two) times daily.       Marland Kitchen losartan (COZAAR) 50 MG tablet Take 100 mg by mouth at bedtime.       . metoprolol succinate (TOPROL-XL) 50 MG 24 hr tablet Take 50 mg by mouth 2 (two) times daily. Does not take on dialysis days Monday Wednesday and Friday      . multivitamin (RENA-VIT) TABS tablet Take 1 tablet by mouth daily.        . repaglinide (PRANDIN) 0.5 MG tablet Take 0.5 mg by mouth 3 (three) times daily before meals.          No results found for this  or any previous visit (from the past 48 hour(s)). No results found.  Review of Systems  Constitutional: Negative.   HENT: Negative.   Eyes: Negative.   Respiratory: Negative.   Cardiovascular: Negative.   Gastrointestinal: Negative.   Genitourinary: Negative.   Musculoskeletal: Negative.   Skin: Negative.   Neurological: Negative.   Endo/Heme/Allergies: Negative.   Psychiatric/Behavioral: Negative.     Blood pressure 206/66, pulse 76, temperature 97 F (36.1 C), temperature source Oral, resp. rate 20, weight 60.3 kg (132 lb 15 oz), SpO2 99.00%. Physical Exam  Constitutional: She is oriented to person, place, and time. She appears well-developed and well-nourished.  HENT:  Head: Normocephalic and atraumatic.  Right Ear: External ear normal.  Left Ear: External ear normal.  Nose: Nose normal.  Mouth/Throat: Oropharynx is clear and moist.  Eyes: Conjunctivae and EOM are normal. Pupils are equal, round, and reactive to light.  Neck: Normal range of motion. Neck supple.  Cardiovascular: Normal rate, regular rhythm, normal  heart sounds and intact distal pulses.   Respiratory: Effort normal and breath sounds normal.  GI: Soft. Bowel sounds are normal.  Musculoskeletal: Normal range of motion.  Neurological: She is alert and oriented to person, place, and time. She has normal reflexes.  Skin: Skin is warm and dry.  Psychiatric: Her behavior is normal. Judgment normal. Her affect is angry. Her speech is tangential. Thought content is delusional.     Assessment/Plan  66 year old female here for hemodialysis. Patient unable to get outpatient hemodialysis. No new issues here. Plan #1 end-stage renal disease: Patient will be admitted for hemodialysis. She'll be on observation and to be discharged after hemodialysis. #2 diabetes: Continue home medications.  #3 hypertension: Continue home medications.  Alexandra [name],LAWAL 09/04/2011, 3:19 AM

## 2011-09-07 ENCOUNTER — Ambulatory Visit (HOSPITAL_COMMUNITY)
Admission: RE | Admit: 2011-09-07 | Discharge: 2011-09-07 | Disposition: A | Discharge status: Home / Self Care | Payer: Medicare HMO | Source: Ambulatory Visit | Attending: Emergency Medicine | Admitting: Emergency Medicine

## 2011-09-07 ENCOUNTER — Encounter (HOSPITAL_COMMUNITY)
Admission: RE | Disposition: A | Discharge status: Home / Self Care | Payer: Self-pay | Source: Ambulatory Visit | Attending: Emergency Medicine

## 2011-09-07 ENCOUNTER — Ambulatory Visit (HOSPITAL_COMMUNITY): Payer: Medicare HMO

## 2011-09-07 ENCOUNTER — Encounter (HOSPITAL_COMMUNITY): Payer: Self-pay | Admitting: Emergency Medicine

## 2011-09-07 DIAGNOSIS — N186 End stage renal disease: Secondary | ICD-10-CM | POA: Insufficient documentation

## 2011-09-07 DIAGNOSIS — Y832 Surgical operation with anastomosis, bypass or graft as the cause of abnormal reaction of the patient, or of later complication, without mention of misadventure at the time of the procedure: Secondary | ICD-10-CM | POA: Insufficient documentation

## 2011-09-07 DIAGNOSIS — R0602 Shortness of breath: Secondary | ICD-10-CM | POA: Insufficient documentation

## 2011-09-07 DIAGNOSIS — Z5309 Procedure and treatment not carried out because of other contraindication: Secondary | ICD-10-CM | POA: Insufficient documentation

## 2011-09-07 DIAGNOSIS — E8779 Other fluid overload: Secondary | ICD-10-CM | POA: Insufficient documentation

## 2011-09-07 DIAGNOSIS — R0600 Dyspnea, unspecified: Secondary | ICD-10-CM

## 2011-09-07 DIAGNOSIS — T82898A Other specified complication of vascular prosthetic devices, implants and grafts, initial encounter: Secondary | ICD-10-CM | POA: Insufficient documentation

## 2011-09-07 LAB — POCT I-STAT, CHEM 8
Calcium, Ion: 1.2 mmol/L (ref 1.12–1.32)
HCT: 39 % (ref 36.0–46.0)
Hemoglobin: 13.3 g/dL (ref 12.0–15.0)
TCO2: 24 mmol/L (ref 0–100)

## 2011-09-07 LAB — GLUCOSE, CAPILLARY: Glucose-Capillary: 100 mg/dL — ABNORMAL HIGH (ref 70–99)

## 2011-09-07 LAB — RENAL FUNCTION PANEL
Albumin: 3.5 g/dL (ref 3.5–5.2)
BUN: 98 mg/dL — ABNORMAL HIGH (ref 6–23)
Calcium: 9.8 mg/dL (ref 8.4–10.5)
GFR calc Af Amer: 4 mL/min — ABNORMAL LOW (ref >90)
Phosphorus: 3.8 mg/dL (ref 2.3–4.6)
Potassium: 5.1 mEq/L (ref 3.5–5.1)
Sodium: 136 mEq/L (ref 135–145)

## 2011-09-07 LAB — CBC
MCH: 23.9 pg — ABNORMAL LOW (ref 26.0–34.0)
MCHC: 32.1 g/dL (ref 30.0–36.0)
RDW: 19.5 % — ABNORMAL HIGH (ref 11.5–15.5)

## 2011-09-07 SURGERY — ASSESSMENT, SHUNT FUNCTION, WITH CONTRAST RADIOGRAPHIC STUDY
Anesthesia: LOCAL

## 2011-09-07 MED ORDER — LIDOCAINE-PRILOCAINE 2.5-2.5 % EX CREA
TOPICAL_CREAM | CUTANEOUS | Status: DC
  Filled 2011-09-07: qty 5

## 2011-09-07 MED ORDER — SODIUM CHLORIDE 0.9 % IJ SOLN: 3.0000 mL | INTRAMUSCULAR | Status: DC | PRN

## 2011-09-07 MED ORDER — HEPARIN SODIUM (PORCINE) 1000 UNIT/ML DIALYSIS
20.0000 [IU]/kg | INTRAMUSCULAR | Status: DC | PRN
  Administered 2011-09-07: 1200 [IU] via INTRAVENOUS_CENTRAL
  Filled 2011-09-07: qty 2

## 2011-09-07 MED ORDER — LIDOCAINE-PRILOCAINE 2.5-2.5 % EX CREA
TOPICAL_CREAM | CUTANEOUS | Status: AC
  Administered 2011-09-07: 11:00:00
  Filled 2011-09-07: qty 5

## 2011-09-07 MED ORDER — PARICALCITOL 5 MCG/ML IV SOLN
INTRAVENOUS | Status: AC
  Administered 2011-09-07: 1 ug via INTRAVENOUS
  Filled 2011-09-07: qty 1

## 2011-09-07 MED ORDER — PARICALCITOL 5 MCG/ML IV SOLN
1.0000 ug | Freq: Once | INTRAVENOUS | Status: AC
  Administered 2011-09-07: 1 ug via INTRAVENOUS

## 2011-09-07 NOTE — Progress Notes (Signed)
Pt more short of breath, crackles noted in bilateral lung bases, pt remains on O2 via [place]. Pt wanting to cancel PV case and go to ED for evaluation and possibly dialysis. Report called to Efraim Kaufmann, RN charge nurse in ED. Pt transported via stretcher without difficulty.

## 2011-09-07 NOTE — Interval H&P Note (Signed)
History and Physical Interval Note:  09/07/2011 7:23 AM  Alexandra [name]  has presented today for surgery, with the diagnosis of instage renal, avf complication  The various methods of treatment have been discussed with the patient and family. After consideration of risks, benefits and other options for treatment, the patient has consented to  Procedure(s) (LRB): SHUNTOGRAM (N/A) as a surgical intervention .  The patient's history has been reviewed, patient examined, no change in status, stable for surgery.  I have reviewed the patients' chart and labs.  Questions were answered to the patient's satisfaction.     Deandre Brannan IV, V. WELLS

## 2011-09-07 NOTE — ED Notes (Signed)
Pt states that she started having SOB this morning, Pt states she was at Short stay to have an procedure when she started to have SOB. Pt denies, fever, chest pain , palpations. PT states she does not use oxygen on a daily basis. Pt states her SOB is worst when lying back.pt lower bilateral legs are 1+ pitting., pt is currently on 4 liters of 02. Pt states she has not had dialysis today.

## 2011-09-07 NOTE — H&P (View-Only) (Signed)
[facility]   Patient name: Alexandra [name] MRN: [id number] DOB: May 07, 1945 Sex: female     Chief Complaint  Patient presents with  . ESRD    Right arm AVG swollen and sore    HISTORY OF PRESENT ILLNESS: The patient comes in today for followup of her right arm fistula. She was originally scheduled to have a fistulogram and December however this was canceled for social reasons. She had been discharged from her dialysis center and was trying to get this resolved and therefore the fistulogram was canceled. She is currently dialyzing in the emergency department. She has a history of having undergone angioplasty of the left upper arm fistula. Her pain was noted to be very reactive and with spasm quickly. She complains of swelling in the right arm which is been going on for approximately 2 weeks. She uses a right-sided catheter for dialysis  Past Medical History  Diagnosis Date  . Chronic kidney disease     esrd  . Diabetes mellitus   . Hyperlipidemia   . Hypertension   . Renal disorder   . Dialysis care     tues, thurs, sat  . 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 Carotid endarterectomy 2002    left  . Btl   . Rectal fissurectomy   . Av fistula placement 10/30/2010    right brachiocephalic   . Fistulogram     History   Social History  . Marital Status: Single    Spouse Name: N/A    Number of Children: N/A  . Years of Education: N/A   Occupational History  . Not on file.   Social History Main Topics  . Smoking status: Current Everyday Smoker -- 1.0 packs/day for 50 years    Types: Cigarettes  . Smokeless tobacco: Never Used  . Alcohol Use: No  . Drug Use: No  . Sexually Active: Not on file   Other Topics Concern  . Not on file   Social History Narrative  . No narrative on file    Family History  Problem Relation Age of Onset  . COPD Mother   . Kidney disease Mother   . Heart disease  Father   . Lymphoma Son     Allergies as of 08/23/2011 - Review Complete 08/23/2011  Allergen Reaction Noted  . Codeine Other (See Comments) 10/01/2010  . Epinephrine Other (See Comments) 12/14/2010  . Percocet (oxycodone-acetaminophen) Other (See Comments) 12/14/2010    Current Outpatient Prescriptions on File Prior to Visit  Medication Sig Dispense Refill  . allopurinol (ZYLOPRIM) 100 MG tablet Take 100 mg by mouth daily.        Marland Kitchen amLODipine (NORVASC) 5 MG tablet Take 10 mg by mouth daily.      . calcium acetate (PHOSLO) 667 MG capsule Take 667 mg by mouth 4 (four) times daily.       . cloNIDine (CATAPRES) 0.1 MG tablet Take 0.2 mg by mouth at bedtime.       . Coenzyme Q10 150 MG CAPS Take 300 mg by mouth daily.       . colchicine 0.6 MG tablet Take 0.6 mg by mouth daily as needed. For gout.      . docusate sodium (COLACE) 100 MG capsule Take 200 mg by mouth at bedtime.        . hydrALAZINE (APRESOLINE) 25 MG tablet Take 50 mg by mouth 3 (three) times  daily.       . l-methylfolate-B6-B12 (METANX) 3-35-2 MG TABS Take 1 tablet by mouth 2 (two) times daily.       Marland Kitchen losartan (COZAAR) 50 MG tablet Take 100 mg by mouth at bedtime.       . metoprolol succinate (TOPROL-XL) 50 MG 24 hr tablet Take 50 mg by mouth at bedtime. Does not take on dialysis days Monday Wednesday and Friday      . multivitamin (RENA-VIT) TABS tablet Take 1 tablet by mouth daily.        . repaglinide (PRANDIN) 0.5 MG tablet Take 0.5 mg by mouth 3 (three) times daily before meals.           REVIEW OF SYSTEMS: Please see history of present illness. Otherwise no changes.  PHYSICAL EXAMINATION:   Vital signs are BP 195/72  Pulse 68  Resp 14  Ht 5\' 8"  (1.727 m)  Wt 131 lb 3.2 oz (59.512 kg)  BMI 19.95 kg/m2  SpO2 98% General: The patient appears their stated age. HEENT:  No gross abnormalities Pulmonary:  Non labored breathing Musculoskeletal: There are no major deformities. Neurologic: No focal weakness or  paresthesias are detected, Skin: There are no ulcer or rashes noted. Psychiatric: The patient has normal affect. Cardiovascular: There is a thrill within the right upper arm fistula however this is not fully matured. She does have mild edema in the right arm   Diagnostic Studies None performed today  Assessment: End-stage renal disease Plan: The patient will be scheduled for a right arm fistulogram. As we performed on Tuesday, June 25. I'll access the fistula near the antecubital crease and intervene as needed. I discussed with the patient that if her fistula is unable to be improved that the next option would be to place a graft. She has a very sensitive vein which is prone to spasm this will need to be factored and during her intervention  V. Charlena Cross, M.D. [facility] [place] Office: [phone] Pager:  [phone]

## 2011-09-07 NOTE — ED Notes (Signed)
Pt. Wanted to sit in chair and get out of the bed.  Transferred to wheelchair out of the bed from Short stay.  Pt. On 4 L of oxygen via [place]

## 2011-09-07 NOTE — ED Notes (Signed)
Pt refused to have ECG or be on the monitor

## 2011-09-07 NOTE — Consult Note (Signed)
Pt. SOB fluid overload dialysis orders written.

## 2011-09-07 NOTE — ED Notes (Signed)
Pt daughters call to check on her mother/ She left her number [phone]

## 2011-09-07 NOTE — ED Notes (Signed)
Pt. Transferred from Short stay suppose to have a PV study, unable due to SOB

## 2011-09-07 NOTE — Procedures (Signed)
Pt was received from ED at 1450 after cancelled intervention on AVF.  Hemodialysis was initiated and patient asked to sleep through procedure, initially.  At 1645 she stated she had not had anything to eat all day.  Glucose registered 100 at this time.  Pt was given graham crackers and warm tea and a renal dinner tray was ordered.  The dinner tray consisted of a [place] sandwich, a fruit cup, and a sugar cookie.  The sandwich was warmed in the microwave at the patient's request.  She consumed half of the [place] sandwich, all of the cookie, and less than half of the fruit cup.  Pt was provided with another cup of warm tea.  At 1815 RN received a phone call from patient's daughter who expressed concern that her mother had not eaten all day.  RN explained that meal trays were not allowed in this procedural area, and reviewed what pt had consumed while on dialysis.

## 2011-09-08 ENCOUNTER — Encounter (HOSPITAL_COMMUNITY): Payer: Self-pay | Admitting: *Deleted

## 2011-09-08 ENCOUNTER — Observation Stay (HOSPITAL_COMMUNITY)
Admission: EM | Admit: 2011-09-08 | Discharge: 2011-09-09 | Discharge status: Against Medical Advice | Payer: Medicare HMO | Attending: Internal Medicine | Admitting: Internal Medicine

## 2011-09-08 ENCOUNTER — Observation Stay (HOSPITAL_COMMUNITY): Payer: Medicare HMO

## 2011-09-08 DIAGNOSIS — I10 Essential (primary) hypertension: Secondary | ICD-10-CM

## 2011-09-08 DIAGNOSIS — E119 Type 2 diabetes mellitus without complications: Secondary | ICD-10-CM | POA: Insufficient documentation

## 2011-09-08 DIAGNOSIS — D638 Anemia in other chronic diseases classified elsewhere: Secondary | ICD-10-CM | POA: Diagnosis present

## 2011-09-08 DIAGNOSIS — E1122 Type 2 diabetes mellitus with diabetic chronic kidney disease: Secondary | ICD-10-CM | POA: Diagnosis present

## 2011-09-08 DIAGNOSIS — Z992 Dependence on renal dialysis: Secondary | ICD-10-CM | POA: Insufficient documentation

## 2011-09-08 DIAGNOSIS — I120 Hypertensive chronic kidney disease with stage 5 chronic kidney disease or end stage renal disease: Secondary | ICD-10-CM | POA: Insufficient documentation

## 2011-09-08 DIAGNOSIS — N186 End stage renal disease: Secondary | ICD-10-CM | POA: Diagnosis present

## 2011-09-08 DIAGNOSIS — E1165 Type 2 diabetes mellitus with hyperglycemia: Secondary | ICD-10-CM

## 2011-09-08 LAB — CBC WITH DIFFERENTIAL/PLATELET
Basophils Absolute: 0 10*3/uL (ref 0.0–0.1)
Eosinophils Absolute: 0.1 10*3/uL (ref 0.0–0.7)
Eosinophils Relative: 2 % (ref 0–5)
MCH: 24 pg — ABNORMAL LOW (ref 26.0–34.0)
MCV: 74.5 fL — ABNORMAL LOW (ref 78.0–100.0)
Neutrophils Relative %: 52 % (ref 43–77)
Platelets: 194 10*3/uL (ref 150–400)
RBC: 4.2 MIL/uL (ref 3.87–5.11)
RDW: 20 % — ABNORMAL HIGH (ref 11.5–15.5)
WBC: 6.8 10*3/uL (ref 4.0–10.5)

## 2011-09-08 LAB — GLUCOSE, CAPILLARY: Glucose-Capillary: 171 mg/dL — ABNORMAL HIGH (ref 70–99)

## 2011-09-08 MED ORDER — HEPARIN SODIUM (PORCINE) 1000 UNIT/ML DIALYSIS
20.0000 [IU]/kg | INTRAMUSCULAR | Status: DC | PRN
  Filled 2011-09-08: qty 2

## 2011-09-08 NOTE — H&P (Signed)
Alexandra [name] is an 66 y.o. female.   Nephrologist - Dr.Frazier. Chief Complaint: Has come for regular dialysis. HPI: 66 year-old female with known history of ESRD on hemodialysis has come for her regular dialysis. Patient denies any chest pain shortness of breath. Patient is not in any acute distress and has no specific complaints at this time.  Past Medical History  Diagnosis Date  . Chronic kidney disease     esrd  . Diabetes mellitus   . Hyperlipidemia   . Hypertension   . Renal disorder   . Dialysis care     tues, thurs, sat  . 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 Carotid endarterectomy 2002    left  . Btl   . Rectal fissurectomy   . Av fistula placement 10/30/2010    right brachiocephalic   . Fistulogram     Family History  Problem Relation Age of Onset  . COPD Mother   . Kidney disease Mother   . Heart disease Father   . Lymphoma Son    Social History:  reports that she has been smoking Cigarettes.  She has a 50 pack-year smoking history. She has never used smokeless tobacco. She reports that she does not drink alcohol or use illicit drugs.  Allergies:  Allergies  Allergen Reactions  . Codeine Other (See Comments)    Passes out  . Epinephrine Other (See Comments)    Tachycardia, diaphoresis, syncope  . Percocet (Oxycodone-Acetaminophen) Other (See Comments)    Passes  out     (Not in a hospital admission)  Results for orders placed during the hospital encounter of 09/07/11 (from the past 48 hour(s))  POCT I-STAT, CHEM 8     Status: Abnormal   Collection Time   09/07/11  8:12 AM      Component Value Range Comment   Sodium 136  135 - 145 mEq/L    Potassium 5.3 (*) 3.5 - 5.1 mEq/L    Chloride 102  96 - 112 mEq/L    BUN 87 (*) 6 - 23 mg/dL    Creatinine, Ser 4.78 (*) 0.50 - 1.10 mg/dL    Glucose, Bld 295 (*) 70 - 99 mg/dL    Calcium, Ion 6.21  1.12 - 1.32 mmol/L    TCO2 24  0 - 100 mmol/L    Hemoglobin 13.3   12.0 - 15.0 g/dL    HCT 30.8  65.7 - 84.6 %   GLUCOSE, CAPILLARY     Status: Abnormal   Collection Time   09/07/11  4:48 PM      Component Value Range Comment   Glucose-Capillary 100 (*) 70 - 99 mg/dL   CBC     Status: Abnormal   Collection Time   09/07/11  5:12 PM      Component Value Range Comment   WBC 9.9  4.0 - 10.5 K/uL    RBC 4.36  3.87 - 5.11 MIL/uL    Hemoglobin 10.4 (*) 12.0 - 15.0 g/dL DELTA CHECK NOTED   HCT 32.4 (*) 36.0 - 46.0 %    MCV 74.3 (*) 78.0 - 100.0 fL    MCH 23.9 (*) 26.0 - 34.0 pg    MCHC 32.1  30.0 - 36.0 g/dL    RDW 96.2 (*) 95.2 - 15.5 %    Platelets 173  150 - 400 K/uL   RENAL FUNCTION PANEL     Status: Abnormal   Collection  Time   09/07/11  5:12 PM      Component Value Range Comment   Sodium 136  135 - 145 mEq/L    Potassium 5.1  3.5 - 5.1 mEq/L    Chloride 94 (*) 96 - 112 mEq/L DELTA CHECK NOTED   CO2 24  19 - 32 mEq/L    Glucose, Bld 104 (*) 70 - 99 mg/dL    BUN 98 (*) 6 - 23 mg/dL    Creatinine, Ser 16.10 (*) 0.50 - 1.10 mg/dL    Calcium 9.8  8.4 - 96.0 mg/dL    Phosphorus 3.8  2.3 - 4.6 mg/dL    Albumin 3.5  3.5 - 5.2 g/dL    GFR calc non Af Amer 3 (*) >90 mL/min    GFR calc Af Amer 4 (*) >90 mL/min    No results found.  Review of Systems  Constitutional: Negative.   HENT: Negative.   Eyes: Negative.   Respiratory: Negative.   Cardiovascular: Negative.   Gastrointestinal: Negative.   Genitourinary: Negative.   Musculoskeletal: Negative.   Skin: Negative.   Neurological: Negative.   Endo/Heme/Allergies: Negative.   Psychiatric/Behavioral: Negative.     Blood pressure 189/63, pulse 82, temperature 98.2 F (36.8 C), temperature source Oral, resp. rate 16, SpO2 96.00%. Physical Exam  Constitutional: She is oriented to person, place, and time. She appears well-developed and well-nourished. No distress.  HENT:  Head: Normocephalic and atraumatic.  Right Ear: External ear normal.  Left Ear: External ear normal.  Nose: Nose normal.    Mouth/Throat: Oropharynx is clear and moist. No oropharyngeal exudate.  Eyes: Conjunctivae are normal. Pupils are equal, round, and reactive to light. Right eye exhibits no discharge. Left eye exhibits no discharge. No scleral icterus.  Neck: Normal range of motion. Neck supple.  Cardiovascular: Normal rate and regular rhythm.   Respiratory: Effort normal and breath sounds normal. No respiratory distress. She has no wheezes. She has no rales.  GI: Soft. Bowel sounds are normal. She exhibits no distension. There is no tenderness. There is no rebound.  Musculoskeletal: Normal range of motion. She exhibits no edema and no tenderness.  Neurological: She is alert and oriented to person, place, and time.       Moves all extremities.  Skin: Skin is warm and dry. She is not diaphoretic.  Psychiatric: Her behavior is normal.     Assessment/Plan #1. ESRD on hemodialysis - dialysis per nephrologist. #2. Hypertension - blood pressure is mildly elevated, recheck after dialysis. Continue present medication. #3. Diabetes mellitus type 2 - continue present medications. CBG checks a.c. and at bedtime with sliding scale coverage.  CODE STATUS - full code.  Eduard Clos. 09/08/2011, 10:03 PM

## 2011-09-08 NOTE — ED Provider Notes (Signed)
History     CSN: [id number]  Arrival date & time 09/08/11  2111   First MD Initiated Contact with Patient 09/08/11 2120      Chief Complaint  Patient presents with  . Routine Dialysis     (Consider location/radiation/quality/duration/timing/severity/associated sxs/prior treatment) The history is provided by the patient.   66 year old female on chronic hemodialysis and comes to the hospital for routine hemodialysis. She denies any chest pain, dyspnea, swelling.   Past Medical History  Diagnosis Date  . Chronic kidney disease     esrd  . Diabetes mellitus   . Hyperlipidemia   . Hypertension   . Renal disorder   . Dialysis care     tues, thurs, sat  . 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 Carotid endarterectomy 2002    left  . Btl   . Rectal fissurectomy   . Av fistula placement 10/30/2010    right brachiocephalic   . Fistulogram     Family History  Problem Relation Age of Onset  . COPD Mother   . Kidney disease Mother   . Heart disease Father   . Lymphoma Son     History  Substance Use Topics  . Smoking status: Current Everyday Smoker -- 1.0 packs/day for 50 years    Types: Cigarettes  . Smokeless tobacco: Never Used  . Alcohol Use: No    OB History    Grav Para Term Preterm Abortions TAB SAB Ect Mult Living                  Review of Systems  All other systems reviewed and are negative.    Allergies  Codeine; Epinephrine; and Percocet  Home Medications   Current Outpatient Rx  Name Route Sig Dispense Refill  . ALLOPURINOL 100 MG PO TABS Oral Take 100 mg by mouth daily.      Marland Kitchen AMLODIPINE BESYLATE 5 MG PO TABS Oral Take 10 mg by mouth daily.    Marland Kitchen CALCIUM ACETATE 667 MG PO CAPS Oral Take 667 mg by mouth 4 (four) times daily.     Marland Kitchen CLONIDINE HCL 0.2 MG PO TABS Oral Take 0.2 mg by mouth at bedtime.    Marland Kitchen COENZYME Q10 150 MG PO CAPS Oral Take 300 mg by mouth daily.     . COLCHICINE 0.6 MG PO TABS Oral Take 0.6  mg by mouth daily as needed. For gout.    Marland Kitchen DOCUSATE SODIUM 100 MG PO CAPS Oral Take 200 mg by mouth at bedtime.      Marland Kitchen HYDRALAZINE HCL 25 MG PO TABS Oral Take 50 mg by mouth 3 (three) times daily.     . L-METHYLFOLATE-B6-B12 3-35-2 MG PO TABS Oral Take 1 tablet by mouth 2 (two) times daily.     Marland Kitchen LOSARTAN POTASSIUM 50 MG PO TABS Oral Take 100 mg by mouth at bedtime.     Marland Kitchen METOPROLOL SUCCINATE ER 50 MG PO TB24 Oral Take 50 mg by mouth 2 (two) times daily. Does not take on dialysis days Monday Wednesday and Friday    . RENA-VITE PO TABS Oral Take 1 tablet by mouth daily.      Marland Kitchen REPAGLINIDE 0.5 MG PO TABS Oral Take 0.5 mg by mouth 3 (three) times daily before meals.        BP 189/63  Pulse 82  Temp 98.2 F (36.8 C) (Oral)  Resp 16  SpO2 96%  Physical  Exam  Nursing note and vitals reviewed.  66 year old female resting comfortably in no acute distress her vital signs are significant for hypertension with blood pressure 100/63. Oxygen saturation is 96% which is normal. Head is normocephalic and atraumatic. PERRLA, EOMI neck is nontender and supple without adenopathy or JVD. Lungs are clear to rales, wheezes, rhonchi. Heart is regular rate and rhythm without murmur. Abdomen soft, flat, nontender extremities no cyanosis or edema. Skin is warm and dry without rash. Neurologic: Parameters are intact, speech is normal. There are no gross motor deficits.  ED Course  Procedures (including critical care time)  Results for orders placed during the hospital encounter of 09/08/11  GLUCOSE, CAPILLARY      Component Value Range   Glucose-Capillary 171 (*) 70 - 99 mg/dL   Comment 1 Documented in Chart     Comment 2 Notify RN    CBC WITH DIFFERENTIAL      Component Value Range   WBC 6.8  4.0 - 10.5 K/uL   RBC 4.20  3.87 - 5.11 MIL/uL   Hemoglobin 10.1 (*) 12.0 - 15.0 g/dL   HCT 16.1 (*) 09.6 - 04.5 %   MCV 74.5 (*) 78.0 - 100.0 fL   MCH 24.0 (*) 26.0 - 34.0 pg   MCHC 32.3  30.0 - 36.0 g/dL   RDW  40.9 (*) 81.1 - 15.5 %   Platelets 194  150 - 400 K/uL   Neutrophils Relative 52  43 - 77 %   Neutro Abs 3.5  1.7 - 7.7 K/uL   Lymphocytes Relative 31  12 - 46 %   Lymphs Abs 2.1  0.7 - 4.0 K/uL   Monocytes Relative 15 (*) 3 - 12 %   Monocytes Absolute 1.0  0.1 - 1.0 K/uL   Eosinophils Relative 2  0 - 5 %   Eosinophils Absolute 0.1  0.0 - 0.7 K/uL   Basophils Relative 0  0 - 1 %   Basophils Absolute 0.0  0.0 - 0.1 K/uL  RENAL FUNCTION PANEL      Component Value Range   Sodium 139  135 - 145 mEq/L   Potassium 4.0  3.5 - 5.1 mEq/L   Chloride 97  96 - 112 mEq/L   CO2 27  19 - 32 mEq/L   Glucose, Bld 141 (*) 70 - 99 mg/dL   BUN 44 (*) 6 - 23 mg/dL   Creatinine, Ser 9.14 (*) 0.50 - 1.10 mg/dL   Calcium 9.7  8.4 - 78.2 mg/dL   Phosphorus 2.8  2.3 - 4.6 mg/dL   Albumin 3.4 (*) 3.5 - 5.2 g/dL   GFR calc non Af Amer 6 (*) >90 mL/min   GFR calc Af Amer 7 (*) >90 mL/min     1. ESRD on hemodialysis       MDM  End-stage renal disease on hemodialysis here for dialysis. Old records are reviewed and she comes to the ED 3 times a week for routine hemodialysis.  Case is discussed with Dr. Toniann Fail who agrees to admit the patient and he will arrange consultation with nephrology for dialysis.      Dione Booze, MD 09/10/11 [phone]

## 2011-09-08 NOTE — ED Notes (Signed)
No SOB/CP, pt ambulatory alert and oriented

## 2011-09-08 NOTE — Consult Note (Signed)
Pt. In ER dialysis orders written . 

## 2011-09-08 NOTE — ED Notes (Signed)
Pt here for routine dialysis, states was here yesterday for respiratory distress.  Dr. Marya Landry has not been notified yet.

## 2011-09-09 LAB — RENAL FUNCTION PANEL
Albumin: 3.4 g/dL — ABNORMAL LOW (ref 3.5–5.2)
Calcium: 9.7 mg/dL (ref 8.4–10.5)
GFR calc Af Amer: 7 mL/min — ABNORMAL LOW (ref >90)
GFR calc non Af Amer: 6 mL/min — ABNORMAL LOW (ref >90)
Phosphorus: 2.8 mg/dL (ref 2.3–4.6)
Potassium: 4 mEq/L (ref 3.5–5.1)
Sodium: 139 mEq/L (ref 135–145)

## 2011-09-09 MED ORDER — DARBEPOETIN ALFA-POLYSORBATE 100 MCG/0.5ML IJ SOLN
INTRAMUSCULAR | Status: AC
  Administered 2011-09-09: 100 ug via INTRAVENOUS
  Filled 2011-09-09: qty 0.5

## 2011-09-09 MED ORDER — DARBEPOETIN ALFA-POLYSORBATE 100 MCG/0.5ML IJ SOLN
100.0000 ug | Freq: Once | INTRAMUSCULAR | Status: AC
  Administered 2011-09-09: 100 ug via INTRAVENOUS
  Filled 2011-09-09: qty 0.5

## 2011-09-09 MED ORDER — HYDROCORTISONE 0.5 % EX OINT
TOPICAL_OINTMENT | Freq: Two times a day (BID) | CUTANEOUS | Status: DC
  Administered 2011-09-09: 04:00:00 via TOPICAL
  Filled 2011-09-09: qty 28.35

## 2011-09-09 MED ORDER — PARICALCITOL 5 MCG/ML IV SOLN
INTRAVENOUS | Status: AC
  Administered 2011-09-09: 1 ug
  Filled 2011-09-09: qty 1

## 2011-09-09 NOTE — Progress Notes (Signed)
Right subclavian catheter insertion site no s/s of infection. Mild skin irritation underneath prior transparent adhesive catheter dressing, patient requested an ointment. Dr. Bascom Levels called, cortizone ointment ordered and applied to catheter insertion site and area of mild skin irritation. Transparent dressing applied after catheter insertion site cleaned with betadine. Pt refused more transparent dressing to further secure 2x2 gauze, requested paper tape. Paper tape applied per patient request. Patient c/o nursing spends too much time charting in computer system and not enough time providing patient care. RN charting while patient requested her cell phone. RN informed patient once documentation is complete RN will retrieve cell phone. Cell phone retrieved once charting complete, patient continues to complain citing [place] [employer] article.

## 2011-09-09 NOTE — Progress Notes (Signed)
Patient taken to ER  To be transported via privately acquired vehicle to her destination. Patient is independent with functional abilities, alert and oriented x 4.

## 2011-09-10 ENCOUNTER — Encounter (HOSPITAL_COMMUNITY): Payer: Self-pay | Admitting: Emergency Medicine

## 2011-09-10 ENCOUNTER — Observation Stay (HOSPITAL_COMMUNITY)
Admission: EM | Admit: 2011-09-10 | Discharge: 2011-09-11 | Disposition: A | Discharge status: Home / Self Care | Payer: Medicare HMO | Attending: Family Medicine | Admitting: Family Medicine

## 2011-09-10 DIAGNOSIS — Z992 Dependence on renal dialysis: Secondary | ICD-10-CM | POA: Insufficient documentation

## 2011-09-10 DIAGNOSIS — I120 Hypertensive chronic kidney disease with stage 5 chronic kidney disease or end stage renal disease: Secondary | ICD-10-CM | POA: Insufficient documentation

## 2011-09-10 DIAGNOSIS — E785 Hyperlipidemia, unspecified: Secondary | ICD-10-CM | POA: Insufficient documentation

## 2011-09-10 DIAGNOSIS — N186 End stage renal disease: Secondary | ICD-10-CM | POA: Diagnosis present

## 2011-09-10 DIAGNOSIS — E119 Type 2 diabetes mellitus without complications: Secondary | ICD-10-CM | POA: Insufficient documentation

## 2011-09-10 DIAGNOSIS — N189 Chronic kidney disease, unspecified: Secondary | ICD-10-CM

## 2011-09-10 NOTE — ED Notes (Signed)
Spoke with Dr. Bascom Levels, he is on the way to see her in ED, he will contact HD. Pt in chair reading paper, alert, pleasant, NAD, calm, interactive, skin W&D, resps e/u, speaking in clear complete sentences.

## 2011-09-10 NOTE — ED Provider Notes (Signed)
History     CSN: [id number]  Arrival date & time 09/10/11  2158   First MD Initiated Contact with Patient 09/10/11 2328     11:52 PM HPI Pt reports she here for her routine dialysis. Denies any symptoms. Dr. Bascom Levels has already been called  The history is provided by the patient.    Past Medical History  Diagnosis Date  . Chronic kidney disease     esrd  . Diabetes mellitus   . Hyperlipidemia   . Hypertension   . Renal disorder   . Dialysis care     tues, thurs, sat  . 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 Carotid endarterectomy 2002    left  . Btl   . Rectal fissurectomy   . Av fistula placement 10/30/2010    right brachiocephalic   . Fistulogram     Family History  Problem Relation Age of Onset  . COPD Mother   . Kidney disease Mother   . Heart disease Father   . Lymphoma Son     History  Substance Use Topics  . Smoking status: Current Everyday Smoker -- 1.0 packs/day for 50 years    Types: Cigarettes  . Smokeless tobacco: Never Used  . Alcohol Use: No    OB History    Grav Para Term Preterm Abortions TAB SAB Ect Mult Living                  Review of Systems  Respiratory: Negative for shortness of breath and wheezing.   Cardiovascular: Negative for chest pain.  Genitourinary: Negative for hematuria.  Musculoskeletal: Negative for back pain.  All other systems reviewed and are negative.    Allergies  Codeine; Epinephrine; and Percocet  Home Medications   Current Outpatient Rx  Name Route Sig Dispense Refill  . ALLOPURINOL 100 MG PO TABS Oral Take 100 mg by mouth daily.      Marland Kitchen AMLODIPINE BESYLATE 5 MG PO TABS Oral Take 10 mg by mouth daily.    Marland Kitchen CALCIUM ACETATE 667 MG PO CAPS Oral Take 667 mg by mouth 4 (four) times daily.     Marland Kitchen CLONIDINE HCL 0.2 MG PO TABS Oral Take 0.2 mg by mouth at bedtime.    Marland Kitchen COENZYME Q10 150 MG PO CAPS Oral Take 300 mg by mouth daily.     . COLCHICINE 0.6 MG PO TABS Oral Take  0.6 mg by mouth daily as needed. For gout.    Marland Kitchen DOCUSATE SODIUM 100 MG PO CAPS Oral Take 200 mg by mouth at bedtime.      Marland Kitchen HYDRALAZINE HCL 25 MG PO TABS Oral Take 50 mg by mouth 3 (three) times daily.     . L-METHYLFOLATE-B6-B12 3-35-2 MG PO TABS Oral Take 1 tablet by mouth 2 (two) times daily.     Marland Kitchen LOSARTAN POTASSIUM 50 MG PO TABS Oral Take 100 mg by mouth at bedtime.     Marland Kitchen METOPROLOL SUCCINATE ER 50 MG PO TB24 Oral Take 50 mg by mouth 2 (two) times daily. Does not take on dialysis days Monday Wednesday and Friday    . RENA-VITE PO TABS Oral Take 1 tablet by mouth daily.      Marland Kitchen REPAGLINIDE 0.5 MG PO TABS Oral Take 0.5 mg by mouth 3 (three) times daily before meals.        BP 164/58  Pulse 71  Temp 98.3 F (36.8 C) (Oral)  Resp 14  SpO2 98%  Physical Exam  Vitals reviewed. Constitutional: She is oriented to person, place, and time. Vital signs are normal. She appears well-developed and well-nourished.  HENT:  Head: Normocephalic and atraumatic.  Eyes: Conjunctivae are normal. Pupils are equal, round, and reactive to light.  Neck: Normal range of motion. Neck supple.  Cardiovascular: Normal rate, regular rhythm and normal heart sounds.  Exam reveals no friction rub.   No murmur heard. Pulmonary/Chest: Effort normal and breath sounds normal. She has no wheezes. She has no rhonchi. She has no rales. She exhibits no tenderness.  Musculoskeletal: Normal range of motion.  Neurological: She is alert and oriented to person, place, and time.  Skin: Skin is warm and dry. No rash noted. No erythema. No pallor.    ED Course  Procedures   MDM   I have spoken with [facility] hospitalist. This will Admit the patient. Dr. Bascom Levels has written orders for her.      Thomasene Lot, PA-C 09/11/11 0021

## 2011-09-10 NOTE — ED Notes (Signed)
PT. IS HERE FOR HER ROUTINE HEMODIALYSIS , NO OTHER COMPLAINTS OR CONCERNS.

## 2011-09-11 ENCOUNTER — Observation Stay (HOSPITAL_COMMUNITY): Payer: Medicare HMO

## 2011-09-11 DIAGNOSIS — N186 End stage renal disease: Secondary | ICD-10-CM

## 2011-09-11 LAB — RENAL FUNCTION PANEL
CO2: 27 mEq/L (ref 19–32)
Chloride: 95 mEq/L — ABNORMAL LOW (ref 96–112)
Creatinine, Ser: 7.25 mg/dL — ABNORMAL HIGH (ref 0.50–1.10)
GFR calc non Af Amer: 5 mL/min — ABNORMAL LOW (ref >90)

## 2011-09-11 LAB — CBC WITH DIFFERENTIAL/PLATELET
Basophils Absolute: 0 10*3/uL (ref 0.0–0.1)
HCT: 31.1 % — ABNORMAL LOW (ref 36.0–46.0)
Hemoglobin: 10 g/dL — ABNORMAL LOW (ref 12.0–15.0)
Lymphocytes Relative: 39 % (ref 12–46)
Monocytes Absolute: 0.9 10*3/uL (ref 0.1–1.0)
Monocytes Relative: 15 % — ABNORMAL HIGH (ref 3–12)
Neutro Abs: 2.6 10*3/uL (ref 1.7–7.7)
RBC: 4.16 MIL/uL (ref 3.87–5.11)
RDW: 20.1 % — ABNORMAL HIGH (ref 11.5–15.5)
WBC: 6.1 10*3/uL (ref 4.0–10.5)

## 2011-09-11 LAB — GLUCOSE, CAPILLARY: Glucose-Capillary: 190 mg/dL — ABNORMAL HIGH (ref 70–99)

## 2011-09-11 MED ORDER — HEPARIN SODIUM (PORCINE) 1000 UNIT/ML DIALYSIS
20.0000 [IU]/kg | INTRAMUSCULAR | Status: DC | PRN
  Filled 2011-09-11: qty 2

## 2011-09-11 MED ORDER — PARICALCITOL 5 MCG/ML IV SOLN
INTRAVENOUS | Status: AC
  Administered 2011-09-11: 1 ug
  Filled 2011-09-11: qty 1

## 2011-09-11 NOTE — ED Provider Notes (Signed)
Medical screening examination/treatment/procedure(s) were conducted as a shared visit with non-physician practitioner(s) and myself.  I personally evaluated the patient during the encounter  Loren Racer, MD 09/11/11 [phone]

## 2011-09-11 NOTE — H&P (Signed)
PCP:   [name], MD   Chief Complaint:  Needs dialysis  HPI: 66 yo female here for routine dialysis.  Pt very biligerent and upset with me asking her questions, says "this is none of your business" and " you are  Not my doctor".  Refusing to ask questions.  Appears from reviewing chart she gets her routine dialysis through the hospital 3 times a week as for unclear reason outpt dialysis has not been able to be set up.  Nephrologist has already seen her and written orders for dialysis.  Per nursing staff in ED, pt routinely yells at staff members.  Review of Systems:  Unable to obtain due to pt refusing to answer questions however she does not appear to be in any respiratory distress  Past Medical History: Past Medical History  Diagnosis Date  . Chronic kidney disease     esrd  . Diabetes mellitus   . Hyperlipidemia   . Hypertension   . Renal disorder   . Dialysis care     tues, thurs, sat  . Gout due to renal impairment    Past Surgical History  Procedure Date  . Carotid endarterectomy 2002    left  . Btl   . Rectal fissurectomy   . Av fistula placement 10/30/2010    right brachiocephalic   . Fistulogram     Medications: Prior to Admission medications   Medication Sig Start Date End Date Taking? Authorizing Provider  allopurinol (ZYLOPRIM) 100 MG tablet Take 100 mg by mouth daily.     Yes Historical Provider, MD  amLODipine (NORVASC) 5 MG tablet Take 10 mg by mouth daily. 07/13/11  Yes [name], MD  calcium acetate (PHOSLO) 667 MG capsule Take 667 mg by mouth 4 (four) times daily.    Yes Historical Provider, MD  cloNIDine (CATAPRES) 0.2 MG tablet Take 0.2 mg by mouth at bedtime.   Yes Historical Provider, MD  Coenzyme Q10 150 MG CAPS Take 300 mg by mouth daily.    Yes Historical Provider, MD  colchicine 0.6 MG tablet Take 0.6 mg by mouth daily as needed. For gout.   Yes Historical Provider, MD  docusate sodium (COLACE) 100 MG capsule Take 200 mg by mouth at  bedtime.     Yes Historical Provider, MD  hydrALAZINE (APRESOLINE) 25 MG tablet Take 50 mg by mouth 3 (three) times daily.  07/18/11  Yes [name], MD  l-methylfolate-B6-B12 (METANX) 3-35-2 MG TABS Take 1 tablet by mouth 2 (two) times daily.    Yes Historical Provider, MD  losartan (COZAAR) 50 MG tablet Take 100 mg by mouth at bedtime.    Yes Historical Provider, MD  metoprolol succinate (TOPROL-XL) 50 MG 24 hr tablet Take 50 mg by mouth 2 (two) times daily. Does not take on dialysis days Monday Wednesday and Friday 07/18/11  Yes [name], MD  multivitamin (RENA-VIT) TABS tablet Take 1 tablet by mouth daily.     Yes Historical Provider, MD  repaglinide (PRANDIN) 0.5 MG tablet Take 0.5 mg by mouth 3 (three) times daily before meals.     Yes Historical Provider, MD    Allergies:   Allergies  Allergen Reactions  . Codeine Other (See Comments)    Passes out  . Epinephrine Other (See Comments)    Tachycardia, diaphoresis, syncope  . Percocet (Oxycodone-Acetaminophen) Other (See Comments)    Passes  out    Social History:  reports that she has been smoking Cigarettes.  She  has a 50 pack-year smoking history. She has never used smokeless tobacco. She reports that she does not drink alcohol or use illicit drugs.  Family History: Family History  Problem Relation Age of Onset  . COPD Mother   . Kidney disease Mother   . Heart disease Father   . Lymphoma Son     Physical Exam: Filed Vitals:   09/10/11 2211 09/10/11 2300  BP: 164/58   Pulse: 71   Temp: 98.3 F (36.8 C)   TempSrc: Oral   Resp: 14   Weight:  58.6 kg (129 lb 3 oz)  SpO2: 98%    General appearance: alert and no distress Lungs: clear to auscultation bilaterally Heart: regular rate and rhythm, S1, S2 normal, no murmur, click, rub or gallop Abdomen: soft, non-tender; bowel sounds normal; no masses,  no organomegaly Extremities: edema 1+ pitting ble Skin: Skin color, texture, turgor normal. No rashes or  lesions Neurologic: Grossly normal    Labs on Admission:   [facility] 09/08/11 2342  NA 139  K 4.0  CL 97  CO2 27  GLUCOSE 141*  BUN 44*  CREATININE 6.47*  CALCIUM 9.7  MG --  PHOS 2.8    Basename 09/08/11 2342  AST --  ALT --  ALKPHOS --  BILITOT --  PROT --  ALBUMIN 3.4*    Basename 09/08/11 2342  WBC 6.8  NEUTROABS 3.5  HGB 10.1*  HCT 31.3*  MCV 74.5*  PLT 194   Radiological Exams on Admission: No results found.  Assessment/Plan Present on Admission:  66 yo female here for routine dialysis .ESRD (end stage renal disease) on dialysis  Dialysis per nephro.  Obs.     Morena Mckissack [phone] 09/11/2011, 12:46 AM

## 2011-09-11 NOTE — Consult Note (Signed)
  Pt. In ER she needs dialysis.

## 2011-09-12 NOTE — ED Provider Notes (Signed)
History    66yF with mild dyspnea. ESRD on dialysis.  Scheduled for fistulogram today when began feeling some dyspnea. Wanted to be dialyzed. Due today. No pain. Edema not significantly worse.  CSN: [id number]  Arrival date & time 09/07/11  1042   First MD Initiated Contact with Patient 09/07/11 1333      Chief Complaint  Patient presents with  . Shortness of Breath    (Consider location/radiation/quality/duration/timing/severity/associated sxs/prior treatment) HPI  Past Medical History  Diagnosis Date  . Chronic kidney disease     esrd  . Diabetes mellitus   . Hyperlipidemia   . Hypertension   . Renal disorder   . Dialysis care     tues, thurs, sat  . 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 Carotid endarterectomy 2002    left  . Btl   . Rectal fissurectomy   . Av fistula placement 10/30/2010    right brachiocephalic   . Fistulogram     Family History  Problem Relation Age of Onset  . COPD Mother   . Kidney disease Mother   . Heart disease Father   . Lymphoma Son     History  Substance Use Topics  . Smoking status: Current Everyday Smoker -- 1.0 packs/day for 50 years    Types: Cigarettes  . Smokeless tobacco: Never Used  . Alcohol Use: No    OB History    Grav Para Term Preterm Abortions TAB SAB Ect Mult Living                  Review of Systems  Review of symptoms negative unless otherwise noted in HPI.   Allergies  Codeine; Epinephrine; and Percocet  Home Medications   Current Outpatient Rx  Name Route Sig Dispense Refill  . ALLOPURINOL 100 MG PO TABS Oral Take 100 mg by mouth daily.      Marland Kitchen AMLODIPINE BESYLATE 5 MG PO TABS Oral Take 10 mg by mouth daily.    Marland Kitchen CALCIUM ACETATE 667 MG PO CAPS Oral Take 667 mg by mouth 4 (four) times daily.     Marland Kitchen CLONIDINE HCL 0.2 MG PO TABS Oral Take 0.2 mg by mouth at bedtime.    Marland Kitchen COENZYME Q10 150 MG PO CAPS Oral Take 300 mg by mouth daily.     . COLCHICINE 0.6 MG PO  TABS Oral Take 0.6 mg by mouth daily as needed. For gout.    Marland Kitchen DOCUSATE SODIUM 100 MG PO CAPS Oral Take 200 mg by mouth at bedtime.      Marland Kitchen HYDRALAZINE HCL 25 MG PO TABS Oral Take 50 mg by mouth 3 (three) times daily.     . L-METHYLFOLATE-B6-B12 3-35-2 MG PO TABS Oral Take 1 tablet by mouth 2 (two) times daily.     Marland Kitchen LOSARTAN POTASSIUM 50 MG PO TABS Oral Take 100 mg by mouth at bedtime.     Marland Kitchen METOPROLOL SUCCINATE ER 50 MG PO TB24 Oral Take 50 mg by mouth 2 (two) times daily. Does not take on dialysis days Monday Wednesday and Friday    . RENA-VITE PO TABS Oral Take 1 tablet by mouth daily.      Marland Kitchen REPAGLINIDE 0.5 MG PO TABS Oral Take 0.5 mg by mouth 3 (three) times daily before meals.        BP 200/72  Pulse 72  Temp 98.4 F (36.9 C) (Oral)  Resp 20  Ht 5\' 8"  (  1.727 m)  Wt 126 lb 12.2 oz (57.5 kg)  BMI 19.27 kg/m2  SpO2 95%  Physical Exam  Nursing note and vitals reviewed. Constitutional: She is oriented to person, place, and time. She appears well-developed and well-nourished. No distress.  HENT:  Head: Normocephalic and atraumatic.  Eyes: Conjunctivae are normal. Right eye exhibits no discharge. Left eye exhibits no discharge.  Neck: Neck supple.  Cardiovascular: Normal rate, regular rhythm and normal heart sounds.  Exam reveals no gallop and no friction rub.   No murmur heard. Pulmonary/Chest: Effort normal and breath sounds normal. No respiratory distress.  Abdominal: Soft. She exhibits no distension. There is no tenderness.  Musculoskeletal: She exhibits edema. She exhibits no tenderness.       1+ pitting LE edema  Neurological: She is alert and oriented to person, place, and time.  Skin: Skin is warm and dry.  Psychiatric: She has a normal mood and affect. Her behavior is normal. Thought content normal.    ED Course  Procedures (including critical care time)  Labs Reviewed  POCT I-STAT, CHEM 8 - Abnormal; Notable for the following:    Potassium 5.3 (*)     BUN 87 (*)      Creatinine, Ser 9.50 (*)     Glucose, Bld 104 (*)     All other components within normal limits  GLUCOSE, CAPILLARY - Abnormal; Notable for the following:    Glucose-Capillary 100 (*)     All other components within normal limits  CBC - Abnormal; Notable for the following:    Hemoglobin 10.4 (*)  DELTA CHECK NOTED   HCT 32.4 (*)     MCV 74.3 (*)     MCH 23.9 (*)     RDW 19.5 (*)     All other components within normal limits  RENAL FUNCTION PANEL - Abnormal; Notable for the following:    Chloride 94 (*)  DELTA CHECK NOTED   Glucose, Bld 104 (*)     BUN 98 (*)     Creatinine, Ser 10.40 (*)     GFR calc non Af Amer 3 (*)     GFR calc Af Amer 4 (*)     All other components within normal limits  LAB REPORT - SCANNED   No results found.   1. Dyspnea   2. ESRD (end stage renal disease)       MDM  66yF presenting for dialysis. Known to Dr. Bascom Levels. He was contacted for orders.        Raeford Razor, MD 09/12/11 [phone]

## 2011-09-14 ENCOUNTER — Encounter (HOSPITAL_COMMUNITY): Payer: Self-pay | Admitting: *Deleted

## 2011-09-14 ENCOUNTER — Observation Stay (HOSPITAL_COMMUNITY): Payer: Medicare HMO

## 2011-09-14 ENCOUNTER — Observation Stay (HOSPITAL_COMMUNITY)
Admission: EM | Admit: 2011-09-14 | Discharge: 2011-09-14 | Disposition: A | Discharge status: Home / Self Care | Payer: Medicare HMO | Attending: Family Medicine | Admitting: Family Medicine

## 2011-09-14 DIAGNOSIS — E785 Hyperlipidemia, unspecified: Secondary | ICD-10-CM | POA: Insufficient documentation

## 2011-09-14 DIAGNOSIS — D638 Anemia in other chronic diseases classified elsewhere: Secondary | ICD-10-CM | POA: Diagnosis present

## 2011-09-14 DIAGNOSIS — E119 Type 2 diabetes mellitus without complications: Secondary | ICD-10-CM

## 2011-09-14 DIAGNOSIS — I10 Essential (primary) hypertension: Secondary | ICD-10-CM

## 2011-09-14 DIAGNOSIS — E1122 Type 2 diabetes mellitus with diabetic chronic kidney disease: Secondary | ICD-10-CM | POA: Diagnosis present

## 2011-09-14 DIAGNOSIS — N186 End stage renal disease: Secondary | ICD-10-CM

## 2011-09-14 DIAGNOSIS — I120 Hypertensive chronic kidney disease with stage 5 chronic kidney disease or end stage renal disease: Secondary | ICD-10-CM | POA: Insufficient documentation

## 2011-09-14 DIAGNOSIS — Z992 Dependence on renal dialysis: Principal | ICD-10-CM

## 2011-09-14 DIAGNOSIS — N19 Unspecified kidney failure: Secondary | ICD-10-CM

## 2011-09-14 LAB — GLUCOSE, CAPILLARY: Glucose-Capillary: 112 mg/dL — ABNORMAL HIGH (ref 70–99)

## 2011-09-14 LAB — CBC WITH DIFFERENTIAL/PLATELET
Basophils Absolute: 0 10*3/uL (ref 0.0–0.1)
Eosinophils Absolute: 0.2 10*3/uL (ref 0.0–0.7)
Eosinophils Relative: 3 % (ref 0–5)
Lymphocytes Relative: 40 % (ref 12–46)
Lymphs Abs: 3 10*3/uL (ref 0.7–4.0)
MCV: 73.1 fL — ABNORMAL LOW (ref 78.0–100.0)
Neutrophils Relative %: 45 % (ref 43–77)
Platelets: 193 10*3/uL (ref 150–400)
RBC: 4.38 MIL/uL (ref 3.87–5.11)
RDW: 20.2 % — ABNORMAL HIGH (ref 11.5–15.5)
WBC: 7.4 10*3/uL (ref 4.0–10.5)

## 2011-09-14 LAB — RENAL FUNCTION PANEL
CO2: 25 mEq/L (ref 19–32)
Calcium: 10 mg/dL (ref 8.4–10.5)
GFR calc Af Amer: 4 mL/min — ABNORMAL LOW (ref >90)
GFR calc non Af Amer: 4 mL/min — ABNORMAL LOW (ref >90)
Phosphorus: 3 mg/dL (ref 2.3–4.6)
Potassium: 4.3 mEq/L (ref 3.5–5.1)
Sodium: 133 mEq/L — ABNORMAL LOW (ref 135–145)

## 2011-09-14 MED ORDER — DOCUSATE SODIUM 100 MG PO CAPS
200.0000 mg | ORAL_CAPSULE | Freq: Once | ORAL | Status: AC
  Administered 2011-09-14: 200 mg via ORAL
  Filled 2011-09-14: qty 2

## 2011-09-14 MED ORDER — HEPARIN SODIUM (PORCINE) 1000 UNIT/ML DIALYSIS
20.0000 [IU]/kg | INTRAMUSCULAR | Status: DC | PRN
  Administered 2011-09-14: 1200 [IU] via INTRAVENOUS_CENTRAL

## 2011-09-14 MED ORDER — PARICALCITOL 5 MCG/ML IV SOLN
1.0000 ug | Freq: Once | INTRAVENOUS | Status: AC
  Administered 2011-09-14: 1 ug via INTRAVENOUS

## 2011-09-14 MED ORDER — CLONIDINE HCL 0.2 MG PO TABS
0.2000 mg | ORAL_TABLET | Freq: Once | ORAL | Status: AC
  Administered 2011-09-14: 0.2 mg via ORAL
  Filled 2011-09-14: qty 1

## 2011-09-14 MED ORDER — HYDRALAZINE HCL 20 MG/ML IJ SOLN
10.0000 mg | INTRAMUSCULAR | Status: DC | PRN
  Filled 2011-09-14: qty 0.5

## 2011-09-14 MED ORDER — PARICALCITOL 5 MCG/ML IV SOLN
INTRAVENOUS | Status: AC
  Administered 2011-09-14: 1 ug via INTRAVENOUS
  Filled 2011-09-14: qty 1

## 2011-09-14 NOTE — H&P (Signed)
Alexandra [name] is an 66 y.o. female.   Nephrologist - Dr.Frazier. Chief Complaint: Has come for regular dialysis. HPI: 66 year-old female with known history of ESRD on hemodialysis has come for her regular dialysis. Denies any chest pain shortness of breath. Has no specific complaints.  Past Medical History  Diagnosis Date  . Chronic kidney disease     esrd  . Diabetes mellitus   . Hyperlipidemia   . Hypertension   . Renal disorder   . Dialysis care     tues, thurs, sat  . 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 Carotid endarterectomy 2002    left  . Btl   . Rectal fissurectomy   . Av fistula placement 10/30/2010    right brachiocephalic   . Fistulogram     Family History  Problem Relation Age of Onset  . COPD Mother   . Kidney disease Mother   . Heart disease Father   . Lymphoma Son    Social History:  reports that she has been smoking Cigarettes.  She has a 50 pack-year smoking history. She has never used smokeless tobacco. She reports that she does not drink alcohol or use illicit drugs.  Allergies:  Allergies  Allergen Reactions  . Codeine Other (See Comments)    Passes out  . Epinephrine Other (See Comments)    Tachycardia, diaphoresis, syncope  . Percocet (Oxycodone-Acetaminophen) Other (See Comments)    Passes  out     (Not in a hospital admission)  Results for orders placed during the hospital encounter of 09/14/11 (from the past 48 hour(s))  GLUCOSE, CAPILLARY     Status: Abnormal   Collection Time   09/14/11  2:39 AM      Component Value Range Comment   Glucose-Capillary 112 (*) 70 - 99 mg/dL    No results found.  Review of Systems  Constitutional: Negative.   HENT: Negative.   Eyes: Negative.   Respiratory: Negative.   Cardiovascular: Negative.   Gastrointestinal: Negative.   Genitourinary: Negative.   Musculoskeletal: Negative.   Skin: Negative.   Neurological: Negative.   Endo/Heme/Allergies:  Negative.   Psychiatric/Behavioral: Negative.     Blood pressure 215/80, pulse 97, temperature 97.5 F (36.4 C), temperature source Oral, resp. rate 19, SpO2 96.00%. Physical Exam  Constitutional: She is oriented to person, place, and time. She appears well-developed and well-nourished. No distress.  HENT:  Head: Normocephalic and atraumatic.  Right Ear: External ear normal.  Left Ear: External ear normal.  Mouth/Throat: No oropharyngeal exudate.  Eyes: Conjunctivae are normal. Pupils are equal, round, and reactive to light. Right eye exhibits no discharge. Left eye exhibits no discharge. No scleral icterus.  Neck: Normal range of motion. Neck supple.  Cardiovascular: Normal rate and regular rhythm.   Respiratory: Effort normal. No respiratory distress. She has no wheezes. She has no rales.  GI: Soft. Bowel sounds are normal.  Musculoskeletal: Normal range of motion. She exhibits no edema and no tenderness.  Neurological: She is alert and oriented to person, place, and time.       Moves all extremities.  Skin: Skin is warm and dry. She is not diaphoretic.     Assessment/Plan #1. ESRD on hemodialysis - dialysis per nephrologist. #2. Uncontrolled hypertension - continue home medications. Keep patient on IV hydralazine when necessary for systolic blood pressure 160. I think blood pressure will improve with dialysis. #3. Diabetes mellitus2 - continue home medications  and sliding scale coverage.  CODE STATUS - full code.  Yadiel Aubry N. 09/14/2011, 2:44 AM

## 2011-09-14 NOTE — ED Notes (Signed)
Patient asleep at this time.   Per report from nightshift, patient refuses to be admitted, refuses IV line, refused medication.   Patient to go to dialysis soon.

## 2011-09-14 NOTE — Discharge Summary (Signed)
Physician Discharge Summary  Trenita Hulme Offutt ZOX:[id number] DOB: Oct 23, 1945 DOA: 09/14/2011  PCP: [name], MD  Admit date: 09/14/2011 Discharge date: 09/14/2011  Patient left AGAINST MEDICAL ADVICE  Discharge Diagnoses:  1. End-stage renal disease 2. Poorly controlled hypertension 3. Diabetes mellitus type 2  History of present illness:  66 year-old female with known history of ESRD on hemodialysis has come for her regular dialysis.  Hospital Course:  Ms. Norva Riffle was admitted for hemodialysis. She subsequently left AGAINST MEDICAL ADVICE prior to my having seen her. I was not notified that had she left.  Labs: Basic Metabolic Panel:  Lab 09/14/11 8119 09/11/11 0253 09/08/11 2342 09/07/11 1712  NA 133* 137 139 136  K 4.3 3.6 4.0 5.1  CL 91* 95* 97 94*  CO2 25 27 27 24   GLUCOSE 83 167* 141* 104*  BUN 72* 53* 44* 98*  CREATININE 9.34* 7.25* 6.47* 10.40*  CALCIUM 10.0 9.6 9.7 9.8  MG -- -- -- --  PHOS 3.0 3.1 2.8 3.8   Liver Function Tests:  Lab 09/14/11 1104 09/11/11 0253 09/08/11 2342 09/07/11 1712  AST -- -- -- --  ALT -- -- -- --  ALKPHOS -- -- -- --  BILITOT -- -- -- --  PROT -- -- -- --  ALBUMIN 3.5 3.2* 3.4* 3.5   CBC:  Lab 09/14/11 1103 09/11/11 0253 09/08/11 2342 09/07/11 1712  WBC 7.4 6.1 6.8 9.9  NEUTROABS 3.3 2.6 3.5 --  HGB 10.4* 10.0* 10.1* 10.4*  HCT 32.0* 31.1* 31.3* 32.4*  MCV 73.1* 74.8* 74.5* 74.3*  PLT 193 194 194 173   CBG:  Lab 09/14/11 0239 09/11/11 0159 09/08/11 2226 09/07/11 1648  GLUCAP 112* 190* 171* 100*    Active Problems:  Anemia of chronic disease  ESRD (end stage renal disease) on dialysis  HTN (hypertension), benign  Diabetes mellitus   Time coordinating discharge: 5 minutes  Signed:  Brendia Sacks, MD [facility] 09/14/2011, 3:49 PM

## 2011-09-14 NOTE — ED Notes (Signed)
MD at bedside. 

## 2011-09-14 NOTE — ED Notes (Signed)
CSW was notified by Pt's [facility] Manager Anibal Henderson [phone]) that he has been unable to reach the Pt at the numbers given to him for follow up. CSW met with Pt while in dialysis to give her Tim's number and verfiy the correct numbers for contacting Pt.  CSW left msg  on Anibal Henderson 's voicemail with Pt's numbers of [phone] and [phone] cell.    Frederico Hamman, LCSW  [phone]

## 2011-09-14 NOTE — ED Provider Notes (Signed)
History     CSN: [id number]  Arrival date & time 09/14/11  0028   First MD Initiated Contact with Patient 09/14/11 [phone]      Chief Complaint  Patient presents with  . needs dialysis     (Consider location/radiation/quality/duration/timing/severity/associated sxs/prior treatment) HPI Comments: Pt is here for her diaylsis which occurs on Tu, Th, Sat.  Last session was on Sat.  She has no CP, SOB, nausea.  She is supposed to have a fistulogram on right arm as it has become swollen. No pain, weakness.    The history is provided by the patient and medical records.    Past Medical History  Diagnosis Date  . Chronic kidney disease     esrd  . Diabetes mellitus   . Hyperlipidemia   . Hypertension   . Renal disorder   . Dialysis care     tues, thurs, sat  . 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 Carotid endarterectomy 2002    left  . Btl   . Rectal fissurectomy   . Av fistula placement 10/30/2010    right brachiocephalic   . Fistulogram     Family History  Problem Relation Age of Onset  . COPD Mother   . Kidney disease Mother   . Heart disease Father   . Lymphoma Son     History  Substance Use Topics  . Smoking status: Current Everyday Smoker -- 1.0 packs/day for 50 years    Types: Cigarettes  . Smokeless tobacco: Never Used  . Alcohol Use: No    OB History    Grav Para Term Preterm Abortions TAB SAB Ect Mult Living                  Review of Systems  Constitutional: Negative for fever and chills.  Eyes: Negative for visual disturbance.  Respiratory: Negative for cough and shortness of breath.   Cardiovascular: Negative for chest pain.  Neurological: Negative for weakness, numbness and headaches.    Allergies  Codeine; Epinephrine; and Percocet  Home Medications   Current Outpatient Rx  Name Route Sig Dispense Refill  . ALLOPURINOL 100 MG PO TABS Oral Take 100 mg by mouth daily.      Marland Kitchen AMLODIPINE BESYLATE 5 MG  PO TABS Oral Take 10 mg by mouth daily.    Marland Kitchen CALCIUM ACETATE 667 MG PO CAPS Oral Take 667 mg by mouth 4 (four) times daily.     Marland Kitchen CLONIDINE HCL 0.2 MG PO TABS Oral Take 0.2 mg by mouth at bedtime.    Marland Kitchen COENZYME Q10 150 MG PO CAPS Oral Take 300 mg by mouth daily.     . COLCHICINE 0.6 MG PO TABS Oral Take 0.6 mg by mouth daily as needed. For gout.    Marland Kitchen DOCUSATE SODIUM 100 MG PO CAPS Oral Take 200 mg by mouth at bedtime.      Marland Kitchen HYDRALAZINE HCL 25 MG PO TABS Oral Take 50 mg by mouth 3 (three) times daily.     . L-METHYLFOLATE-B6-B12 3-35-2 MG PO TABS Oral Take 1 tablet by mouth 2 (two) times daily.     Marland Kitchen LOSARTAN POTASSIUM 50 MG PO TABS Oral Take 100 mg by mouth at bedtime.     Marland Kitchen METOPROLOL SUCCINATE ER 50 MG PO TB24 Oral Take 50 mg by mouth 2 (two) times daily. Does not take on dialysis days Monday Wednesday and Friday    .  RENA-VITE PO TABS Oral Take 1 tablet by mouth daily.      Marland Kitchen REPAGLINIDE 0.5 MG PO TABS Oral Take 0.5 mg by mouth 3 (three) times daily before meals.        BP 209/62  Pulse 89  Temp 97.9 F (36.6 C) (Oral)  Resp 19  SpO2 95%  Physical Exam  Nursing note and vitals reviewed. Constitutional: She is oriented to person, place, and time. She appears well-developed and well-nourished.  HENT:  Head: Normocephalic and atraumatic.  Eyes: Pupils are equal, round, and reactive to light.  Pulmonary/Chest: Effort normal. No respiratory distress.  Neurological: She is alert and oriented to person, place, and time.  Skin: Skin is dry.    ED Course  Procedures (including critical care time)  Labs Reviewed - No data to display No results found.   1. Renal failure   2. Hypertension     RA sat is 95% and normal by my interpretation.    MDM  Pt is here for her regularly scheduled Tuesday dialysis session.  No complaints acutely. Pt is HTN, but no evidence of end organ failure.  No HA, blurred vision, stroke symptoms, CP, SOB.     I spoke to [facility] hospitalist who agrees  for admission.         Gavin Pound. Oletta Lamas, MD 09/14/11 1610

## 2011-09-14 NOTE — ED Notes (Signed)
The pt is here for dialysis.  This is rourine

## 2011-09-14 NOTE — ED Notes (Signed)
Notified dr. Lars Masson of pt current blood pressure. Orders to give hydralazine IVP, pt refuses to take hydralazine iv, she says she has already taken 4 hydralazine today, her blood pressure is always high, and dr. Bascom Levels is the only one to make those changes.  She also states that no RN is going to start an IV, only the IV team starts her IV's.  Spoke with dr. Lars Masson of the same, reports to administer pt normal Clonidine dosage that the patient takes at home if she has not taken her medication tonight. Pt reports that she takes Clonidine 0.2mg  and she has not taken tonight.

## 2011-09-14 NOTE — Consult Note (Signed)
Pt. In ER .she refuses breakfast. Dialysis orders written.

## 2011-09-15 ENCOUNTER — Observation Stay (HOSPITAL_COMMUNITY)
Admission: EM | Admit: 2011-09-15 | Discharge: 2011-09-16 | Disposition: A | Discharge status: Home / Self Care | Payer: Medicare HMO | Attending: Internal Medicine | Admitting: Internal Medicine

## 2011-09-15 ENCOUNTER — Encounter (HOSPITAL_COMMUNITY): Payer: Self-pay | Admitting: Emergency Medicine

## 2011-09-15 DIAGNOSIS — N186 End stage renal disease: Secondary | ICD-10-CM | POA: Insufficient documentation

## 2011-09-15 DIAGNOSIS — E119 Type 2 diabetes mellitus without complications: Secondary | ICD-10-CM | POA: Insufficient documentation

## 2011-09-15 DIAGNOSIS — I120 Hypertensive chronic kidney disease with stage 5 chronic kidney disease or end stage renal disease: Secondary | ICD-10-CM | POA: Insufficient documentation

## 2011-09-15 DIAGNOSIS — Z992 Dependence on renal dialysis: Secondary | ICD-10-CM | POA: Insufficient documentation

## 2011-09-15 DIAGNOSIS — I10 Essential (primary) hypertension: Secondary | ICD-10-CM

## 2011-09-15 DIAGNOSIS — E1122 Type 2 diabetes mellitus with diabetic chronic kidney disease: Secondary | ICD-10-CM | POA: Diagnosis present

## 2011-09-15 NOTE — ED Notes (Signed)
[place] [facility] [place] [phone] 

## 2011-09-15 NOTE — ED Provider Notes (Signed)
History     CSN: [id number]  Arrival date & time 09/15/11  2250   First MD Initiated Contact with Patient 09/15/11 2334      Chief complaint is needs dialysis  (Consider location/radiation/quality/duration/timing/severity/associated sxs/prior treatment) HPI History provided by patient. Here for routine dialysis. Normally gets Tuesday Thursday Saturday dialysis. Last dialysis was on Tuesday. Patient has no complaints. She denies any chest pain or shortness of breath. No swelling or weakness. No nausea vomiting. No itching. No problems with her dialysis access. No pain or radiation. Moderate in severity.  Past Medical History  Diagnosis Date  . Chronic kidney disease     esrd  . Diabetes mellitus   . Hyperlipidemia   . Hypertension   . Renal disorder   . Dialysis care     tues, thurs, sat  . 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 Carotid endarterectomy 2002    left  . Btl   . Rectal fissurectomy   . Av fistula placement 10/30/2010    right brachiocephalic   . Fistulogram     Family History  Problem Relation Age of Onset  . COPD Mother   . Kidney disease Mother   . Heart disease Father   . Lymphoma Son     History  Substance Use Topics  . Smoking status: Current Everyday Smoker -- 1.0 packs/day for 50 years    Types: Cigarettes  . Smokeless tobacco: Never Used  . Alcohol Use: No    OB History    Grav Para Term Preterm Abortions TAB SAB Ect Mult Living                  Review of Systems  Constitutional: Negative for fever and chills.  HENT: Negative for neck pain and neck stiffness.   Eyes: Negative for pain.  Respiratory: Negative for shortness of breath.   Cardiovascular: Negative for chest pain.  Gastrointestinal: Negative for abdominal pain.  Genitourinary: Negative for dysuria.  Musculoskeletal: Negative for back pain.  Skin: Negative for rash.  Neurological: Negative for headaches.  All other systems reviewed  and are negative.    Allergies  Codeine; Epinephrine; and Percocet  Home Medications   Current Outpatient Rx  Name Route Sig Dispense Refill  . ALLOPURINOL 100 MG PO TABS Oral Take 100 mg by mouth daily.      Marland Kitchen AMLODIPINE BESYLATE 5 MG PO TABS Oral Take 10 mg by mouth daily.    Marland Kitchen CALCIUM ACETATE 667 MG PO CAPS Oral Take 667 mg by mouth 4 (four) times daily.     Marland Kitchen CLONIDINE HCL 0.2 MG PO TABS Oral Take 0.2 mg by mouth at bedtime.    Marland Kitchen COENZYME Q10 150 MG PO CAPS Oral Take 300 mg by mouth daily.     . COLCHICINE 0.6 MG PO TABS Oral Take 0.6 mg by mouth daily as needed. For gout.    Marland Kitchen DOCUSATE SODIUM 100 MG PO CAPS Oral Take 200 mg by mouth at bedtime.      Marland Kitchen HYDRALAZINE HCL 25 MG PO TABS Oral Take 50 mg by mouth 3 (three) times daily.     . L-METHYLFOLATE-B6-B12 3-35-2 MG PO TABS Oral Take 1 tablet by mouth 2 (two) times daily.     Marland Kitchen LOSARTAN POTASSIUM 50 MG PO TABS Oral Take 100 mg by mouth at bedtime.     Marland Kitchen METOPROLOL SUCCINATE ER 50 MG PO TB24 Oral Take 50 mg  by mouth 2 (two) times daily. Does not take on dialysis days Monday Wednesday and Friday    . RENA-VITE PO TABS Oral Take 1 tablet by mouth daily.      Marland Kitchen REPAGLINIDE 0.5 MG PO TABS Oral Take 0.5 mg by mouth 3 (three) times daily before meals.        BP 196/63  Pulse 73  Temp 97.6 F (36.4 C) (Oral)  Resp 18  SpO2 99%  Physical Exam  Constitutional: She is oriented to person, place, and time. She appears well-developed and well-nourished.  HENT:  Head: Normocephalic and atraumatic.  Eyes: Conjunctivae and EOM are normal. Pupils are equal, round, and reactive to light.  Neck: Trachea normal. Neck supple. No thyromegaly present.  Cardiovascular: Normal rate, regular rhythm, S1 normal, S2 normal and normal pulses.     No systolic murmur is present   No diastolic murmur is present  Pulses:      Radial pulses are 2+ on the right side, and 2+ on the left side.  Pulmonary/Chest: Effort normal and breath sounds normal. She  has no wheezes. She has no rhonchi. She has no rales. She exhibits no tenderness.  Abdominal: Soft. Normal appearance and bowel sounds are normal. There is no tenderness. There is no CVA tenderness and negative Murphy's sign.  Musculoskeletal:       BLE:s Calves nontender, no cords or erythema, negative Homans sign  Neurological: She is alert and oriented to person, place, and time. She has normal strength. No cranial nerve deficit or sensory deficit. GCS eye subscore is 4. GCS verbal subscore is 5. GCS motor subscore is 6.  Skin: Skin is warm and dry. No rash noted. She is not diaphoretic.  Psychiatric: Her speech is normal.       Cooperative and appropriate    ED Course  Procedures (including critical care time)  Patient gets routine dialysis by coming to the emergency department. She typically does not get labs drawn as indicated. No indication for EKG or chest x-ray at this time.  Medicine consult for admission.  Called to the dialysis center and they do have availability at this time 11:46 PM  11:55 PM d/w Dr Kara Pacer - will admit for dialysis tonight. MDM   Here for routine dialysis. Plan admit MED for same.        Sunnie Nielsen, MD 09/15/11 [phone]

## 2011-09-15 NOTE — ED Notes (Signed)
PT. IS HERE FOR HER ROUTINE HEMODIALYSIS , NO OTHER CONCERNS OR COMPLAINTS .

## 2011-09-16 ENCOUNTER — Inpatient Hospital Stay (HOSPITAL_COMMUNITY): Payer: Medicare HMO

## 2011-09-16 DIAGNOSIS — I10 Essential (primary) hypertension: Secondary | ICD-10-CM

## 2011-09-16 DIAGNOSIS — E119 Type 2 diabetes mellitus without complications: Secondary | ICD-10-CM

## 2011-09-16 DIAGNOSIS — N186 End stage renal disease: Secondary | ICD-10-CM

## 2011-09-16 LAB — RENAL FUNCTION PANEL
BUN: 40 mg/dL — ABNORMAL HIGH (ref 6–23)
CO2: 27 mEq/L (ref 19–32)
Calcium: 9.8 mg/dL (ref 8.4–10.5)
GFR calc Af Amer: 7 mL/min — ABNORMAL LOW (ref >90)
Glucose, Bld: 123 mg/dL — ABNORMAL HIGH (ref 70–99)
Phosphorus: 3.6 mg/dL (ref 2.3–4.6)
Sodium: 137 mEq/L (ref 135–145)

## 2011-09-16 LAB — CBC WITH DIFFERENTIAL/PLATELET
Eosinophils Absolute: 0.2 10*3/uL (ref 0.0–0.7)
Eosinophils Relative: 3 % (ref 0–5)
HCT: 31.9 % — ABNORMAL LOW (ref 36.0–46.0)
Hemoglobin: 10.2 g/dL — ABNORMAL LOW (ref 12.0–15.0)
Lymphocytes Relative: 41 % (ref 12–46)
Lymphs Abs: 2.4 10*3/uL (ref 0.7–4.0)
MCH: 23.4 pg — ABNORMAL LOW (ref 26.0–34.0)
MCV: 73.3 fL — ABNORMAL LOW (ref 78.0–100.0)
Monocytes Relative: 16 % — ABNORMAL HIGH (ref 3–12)
Platelets: 164 10*3/uL (ref 150–400)
RBC: 4.35 MIL/uL (ref 3.87–5.11)
WBC: 5.8 10*3/uL (ref 4.0–10.5)

## 2011-09-16 MED ORDER — ALLOPURINOL 100 MG PO TABS: 100.0000 mg | ORAL_TABLET | Freq: Every day | ORAL | Status: DC

## 2011-09-16 MED ORDER — ONDANSETRON HCL 4 MG/2ML IJ SOLN: 4.0000 mg | Freq: Four times a day (QID) | INTRAMUSCULAR | Status: DC | PRN

## 2011-09-16 MED ORDER — DOCUSATE SODIUM 100 MG PO CAPS: 200.0000 mg | ORAL_CAPSULE | Freq: Every day | ORAL | Status: DC

## 2011-09-16 MED ORDER — CLONIDINE HCL 0.2 MG PO TABS
0.2000 mg | ORAL_TABLET | ORAL | Status: DC | PRN
  Filled 2011-09-16: qty 1

## 2011-09-16 MED ORDER — DOCUSATE SODIUM 100 MG PO CAPS: 100.0000 mg | ORAL_CAPSULE | Freq: Two times a day (BID) | ORAL | Status: DC

## 2011-09-16 MED ORDER — PARICALCITOL 5 MCG/ML IV SOLN
INTRAVENOUS | Status: AC
  Administered 2011-09-16: 1 ug via INTRAVENOUS
  Filled 2011-09-16: qty 1

## 2011-09-16 MED ORDER — ACETAMINOPHEN 325 MG PO TABS: 650.0000 mg | ORAL_TABLET | Freq: Four times a day (QID) | ORAL | Status: DC | PRN

## 2011-09-16 MED ORDER — CALCIUM ACETATE 667 MG PO CAPS: 667.0000 mg | ORAL_CAPSULE | Freq: Four times a day (QID) | ORAL | Status: DC

## 2011-09-16 MED ORDER — CLONIDINE HCL 0.2 MG PO TABS
0.2000 mg | ORAL_TABLET | Freq: Every day | ORAL | Status: DC
  Filled 2011-09-16: qty 1

## 2011-09-16 MED ORDER — METOPROLOL SUCCINATE ER 50 MG PO TB24: 50.0000 mg | ORAL_TABLET | Freq: Two times a day (BID) | ORAL | Status: DC

## 2011-09-16 MED ORDER — LOSARTAN POTASSIUM 50 MG PO TABS: 100.0000 mg | ORAL_TABLET | Freq: Every day | ORAL | Status: DC

## 2011-09-16 MED ORDER — DARBEPOETIN ALFA-POLYSORBATE 100 MCG/0.5ML IJ SOLN
100.0000 ug | Freq: Once | INTRAMUSCULAR | Status: AC
  Administered 2011-09-16: 100 ug via INTRAVENOUS
  Filled 2011-09-16: qty 0.5

## 2011-09-16 MED ORDER — ALBUTEROL SULFATE (5 MG/ML) 0.5% IN NEBU: 2.5000 mg | INHALATION_SOLUTION | RESPIRATORY_TRACT | Status: DC | PRN

## 2011-09-16 MED ORDER — AMLODIPINE BESYLATE 10 MG PO TABS: 10.0000 mg | ORAL_TABLET | Freq: Every day | ORAL | Status: DC

## 2011-09-16 MED ORDER — REPAGLINIDE 0.5 MG PO TABS: 0.5000 mg | ORAL_TABLET | Freq: Three times a day (TID) | ORAL | Status: DC

## 2011-09-16 MED ORDER — ONDANSETRON HCL 4 MG PO TABS: 4.0000 mg | ORAL_TABLET | Freq: Four times a day (QID) | ORAL | Status: DC | PRN

## 2011-09-16 MED ORDER — ACETAMINOPHEN 650 MG RE SUPP: 650.0000 mg | Freq: Four times a day (QID) | RECTAL | Status: DC | PRN

## 2011-09-16 MED ORDER — L-METHYLFOLATE-B6-B12 3-35-2 MG PO TABS: 1.0000 | ORAL_TABLET | Freq: Two times a day (BID) | ORAL | Status: DC

## 2011-09-16 MED ORDER — PARICALCITOL 5 MCG/ML IV SOLN
1.0000 ug | Freq: Once | INTRAVENOUS | Status: AC
  Administered 2011-09-16: 1 ug via INTRAVENOUS
  Filled 2011-09-16: qty 0.2

## 2011-09-16 MED ORDER — HYDRALAZINE HCL 50 MG PO TABS: 50.0000 mg | ORAL_TABLET | Freq: Three times a day (TID) | ORAL | Status: DC

## 2011-09-16 MED ORDER — DARBEPOETIN ALFA-POLYSORBATE 100 MCG/0.5ML IJ SOLN
INTRAMUSCULAR | Status: AC
  Administered 2011-09-16: 100 ug via INTRAVENOUS
  Filled 2011-09-16: qty 0.5

## 2011-09-16 MED ORDER — HYDROCODONE-ACETAMINOPHEN 5-325 MG PO TABS: 1.0000 | ORAL_TABLET | ORAL | Status: DC | PRN

## 2011-09-16 MED ORDER — GUAIFENESIN-DM 100-10 MG/5ML PO SYRP
5.0000 mL | ORAL_SOLUTION | ORAL | Status: DC | PRN
  Filled 2011-09-16: qty 5

## 2011-09-16 NOTE — H&P (Signed)
PCP:  [name], MD  Nephrologist: Jeri Cos  Chief Complaint:   Needs Dialyses  HPI: Alexandra [name] is a 66 y.o. female   has a past medical history of Chronic kidney disease; Diabetes mellitus; Hyperlipidemia; Hypertension; Renal disorder; Dialysis care; and Gout due to renal impairment.   Presented with  Patient is in need of her HD, She gets dialyses on Tuesday, Thursday and Saturday. Denies any complains no SOB no chest pain no fever.   Review of Systems:    Pertinent positives include: none  Constitutional:  No weight loss, night sweats, Fevers, chills, fatigue, weight loss  HEENT:  No headaches, Difficulty swallowing,Tooth/dental problems,Sore throat,  No sneezing, itching, ear ache, nasal congestion, post nasal drip,  Cardio-vascular:  No chest pain, Orthopnea, PND, anasarca, dizziness, palpitations.no Bilateral lower extremity swelling  GI:  No heartburn, indigestion, abdominal pain, nausea, vomiting, diarrhea, change in bowel habits, loss of appetite, melena, blood in stool, hematemesis Resp:  no shortness of breath at rest. No dyspnea on exertion, No excess mucus, no productive cough, No non-productive cough, No coughing up of blood.No change in color of mucus.No wheezing. Skin:  no rash or lesions. No jaundice GU:  no dysuria, change in color of urine, no urgency or frequency. No straining to urinate.  No flank pain.  Musculoskeletal:  No joint pain or no joint swelling. No decreased range of motion. No back pain.  Psych:  No change in mood or affect. No depression or anxiety. No memory loss.  Neuro: no localizing neurological complaints, no tingling, no weakness, no double vision, no gait abnormality, no slurred speech, no confusion  Otherwise ROS are negative except for above, 10 systems were reviewed  Past Medical History: Past Medical History  Diagnosis Date  . Chronic kidney disease     esrd  . Diabetes mellitus   . Hyperlipidemia    . Hypertension   . Renal disorder   . Dialysis care     tues, thurs, sat  . Gout due to renal impairment    Past Surgical History  Procedure Date  . Carotid endarterectomy 2002    left  . Btl   . Rectal fissurectomy   . Av fistula placement 10/30/2010    right brachiocephalic   . Fistulogram      Medications: Prior to Admission medications   Medication Sig Start Date End Date Taking? Authorizing Provider  allopurinol (ZYLOPRIM) 100 MG tablet Take 100 mg by mouth daily.     Yes Historical Provider, MD  amLODipine (NORVASC) 5 MG tablet Take 10 mg by mouth daily. 07/13/11  Yes [name], MD  calcium acetate (PHOSLO) 667 MG capsule Take 667 mg by mouth 4 (four) times daily.    Yes Historical Provider, MD  cloNIDine (CATAPRES) 0.2 MG tablet Take 0.2 mg by mouth at bedtime.   Yes Historical Provider, MD  Coenzyme Q10 150 MG CAPS Take 300 mg by mouth daily.    Yes Historical Provider, MD  colchicine 0.6 MG tablet Take 0.6 mg by mouth daily as needed. For gout.   Yes Historical Provider, MD  docusate sodium (COLACE) 100 MG capsule Take 200 mg by mouth at bedtime.     Yes Historical Provider, MD  hydrALAZINE (APRESOLINE) 25 MG tablet Take 50 mg by mouth 3 (three) times daily.  07/18/11  Yes [name], MD  l-methylfolate-B6-B12 (METANX) 3-35-2 MG TABS Take 1 tablet by mouth 2 (two) times daily.    Yes Historical Provider, MD  losartan (COZAAR) 50 MG tablet Take 100 mg by mouth at bedtime.    Yes Historical Provider, MD  metoprolol succinate (TOPROL-XL) 50 MG 24 hr tablet Take 50 mg by mouth 2 (two) times daily. Does not take on dialysis days Monday Wednesday and Friday 07/18/11  Yes [name], MD  multivitamin (RENA-VIT) TABS tablet Take 1 tablet by mouth daily.     Yes Historical Provider, MD  repaglinide (PRANDIN) 0.5 MG tablet Take 0.5 mg by mouth 3 (three) times daily before meals.     Yes Historical Provider, MD    Allergies:   Allergies  Allergen Reactions  . Codeine  Other (See Comments)    Passes out  . Epinephrine Other (See Comments)    Tachycardia, diaphoresis, syncope  . Percocet (Oxycodone-Acetaminophen) Other (See Comments)    Passes  out    Social History:  Ambulatory  independently  Lives at  home   reports that she has been smoking Cigarettes.  She has a 50 pack-year smoking history. She has never used smokeless tobacco. She reports that she does not drink alcohol or use illicit drugs.   Family History: family history includes COPD in her mother; Heart disease in her father; Kidney disease in her mother; and Lymphoma in her son.    Physical Exam: Patient Vitals for the past 24 hrs:  BP Temp Temp src Pulse Resp SpO2  09/15/11 2317 196/63 mmHg 97.6 F (36.4 C) Oral 73  18  99 %    1. General:  in No Acute distress 2. Psychological: Alert and Oriented 3. Head/ENT:   Moist Mucous Membranes                          Head Non traumatic, neck supple                          Normal  Dentition 4. SKIN: normal Skin turgor,  Skin clean Dry and intact no rash 5. Heart: Regular rate and rhythm no Murmur, Rub or gallop 6. Lungs: Clear to auscultation bilaterally, no wheezes or crackles   7. Abdomen: Soft, non-tender, Non distended 8. Lower extremities: no clubbing, cyanosis, or edema 9. Neurologically Grossly intact, moving all 4 extremities equally 10. MSK: Normal range of motion  body mass index is unknown because there is no height or weight on file.   Labs on Admission:   [facility] 09/14/11 1104  NA 133*  K 4.3  CL 91*  CO2 25  GLUCOSE 83  BUN 72*  CREATININE 9.34*  CALCIUM 10.0  MG --  PHOS 3.0    Basename 09/14/11 1104  AST --  ALT --  ALKPHOS --  BILITOT --  PROT --  ALBUMIN 3.5   No results found for this basename: LIPASE:2,AMYLASE:2 in the last 72 hours  Basename 09/14/11 1103  WBC 7.4  NEUTROABS 3.3  HGB 10.4*  HCT 32.0*  MCV 73.1*  PLT 193   No results found for this basename:  CKTOTAL:3,CKMB:3,CKMBINDEX:3,TROPONINI:3 in the last 72 hours No results found for this basename: TSH,T4TOTAL,FREET3,T3FREE,THYROIDAB in the last 72 hours No results found for this basename: VITAMINB12:2,FOLATE:2,FERRITIN:2,TIBC:2,IRON:2,RETICCTPCT:2 in the last 72 hours Lab Results  Component Value Date   HGBA1C 5.4 08/02/2011    The CrCl is unknown because both a height and weight (above a minimum accepted value) are required for this calculation. ABG    Component Value Date/Time   TCO2 24  09/07/2011 0812     No results found for this basename: DDIMER     Other results:  I have pearsonaly reviewed this: ECG REPORT  Rate: 93  Rhythm: NSR ST&T Change: non ischemic   Cultures:    Component Value Date/Time   SDES URINE, CLEAN CATCH 08/20/2009 2114   SPECREQUEST NONE 08/20/2009 2114   CULT NO GROWTH 08/20/2009 2114   REPTSTATUS 08/22/2009 FINAL 08/20/2009 2114       Radiological Exams on Admission: No results found.  Assessment/Plan  66 yo With History of ESRD and Diabetes here to get her HD.   Present on Admission:   .End stage renal disease - admitted for HD, no complaints .HTN (hypertension), benign - severe, continue home medication, it usually comes down after HD.  She have not had her evening dose of clonidine. IF needed will write for PRN Hydralazine .Diabetes mellitus - Continue home medications, SSI while in house  Prophylaxis: SCD   CODE STATUS: FULL CODE  Other plan as per orders.  I have spent a total of 40 min on this admission  Alexandra [name] 09/16/2011, 12:29 AM

## 2011-09-16 NOTE — Progress Notes (Signed)
Call from Emergency Department inform RN of patient arrival to the ED. Hemodialysis RN advised ED nurse that the hospitalist must be notified and Admission orders must be entered into [facility] prior to this patient being accepted to the Hemodialysis unit per Management. Call from Julien Girt RN stating this patient has admission orders entered into EPIC. Hemodialysis RN checked Active and Signed and Held orders, no admission orders, informed Julien Girt RN from ED of the same. She insists the orders are in the [facility] system. Hemodialysis RN advised that if orders are visible in the EP[facility] system she is viewing this patient can be transported to Hemodislysis unit. Hemodialysis RN cannot visualize Admission orders via EPIC.

## 2011-09-16 NOTE — Progress Notes (Signed)
Patient left the building before I could get to her  Alexandra [name]

## 2011-09-17 ENCOUNTER — Observation Stay (HOSPITAL_COMMUNITY)
Admission: EM | Admit: 2011-09-17 | Discharge: 2011-09-18 | Disposition: A | Discharge status: Home / Self Care | Payer: Medicare HMO | Attending: Internal Medicine | Admitting: Internal Medicine

## 2011-09-17 ENCOUNTER — Encounter (HOSPITAL_COMMUNITY): Payer: Self-pay | Admitting: Emergency Medicine

## 2011-09-17 ENCOUNTER — Other Ambulatory Visit (HOSPITAL_COMMUNITY): Payer: Self-pay | Admitting: Nephrology

## 2011-09-17 DIAGNOSIS — E1122 Type 2 diabetes mellitus with diabetic chronic kidney disease: Secondary | ICD-10-CM | POA: Diagnosis present

## 2011-09-17 DIAGNOSIS — N186 End stage renal disease: Secondary | ICD-10-CM

## 2011-09-17 DIAGNOSIS — E119 Type 2 diabetes mellitus without complications: Secondary | ICD-10-CM | POA: Insufficient documentation

## 2011-09-17 DIAGNOSIS — E785 Hyperlipidemia, unspecified: Secondary | ICD-10-CM | POA: Insufficient documentation

## 2011-09-17 DIAGNOSIS — Z992 Dependence on renal dialysis: Secondary | ICD-10-CM | POA: Insufficient documentation

## 2011-09-17 DIAGNOSIS — Z8739 Personal history of other diseases of the musculoskeletal system and connective tissue: Secondary | ICD-10-CM

## 2011-09-17 DIAGNOSIS — I120 Hypertensive chronic kidney disease with stage 5 chronic kidney disease or end stage renal disease: Secondary | ICD-10-CM | POA: Insufficient documentation

## 2011-09-17 DIAGNOSIS — I10 Essential (primary) hypertension: Secondary | ICD-10-CM | POA: Diagnosis present

## 2011-09-17 LAB — GLUCOSE, CAPILLARY: Glucose-Capillary: 168 mg/dL — ABNORMAL HIGH (ref 70–99)

## 2011-09-17 NOTE — ED Notes (Signed)
Pt here for dialysis.  States they called her at 9:30pm and said they were ready for her.

## 2011-09-17 NOTE — ED Notes (Signed)
Pt arrived stating that she needs to know her blood sugar.

## 2011-09-17 NOTE — Discharge Summary (Signed)
Physician Discharge Summary  Alexandra Alexandra [name] NWG:[id number] DOB: January 07, 1946 DOA: 09/15/2011  PCP: [name], MD  Admit date: 09/15/2011 Discharge date: 09/16/2011   Discharge Diagnoses:  Active Problems:  HTN (hypertension), benign  Diabetes mellitus  End stage renal disease  History of present illness:  66 year old Alexandra [name] who does not have ability to secure outpatient dialysis presented to the emergency room with the chief complaint of i need dialysis  Hospital Course:  Alexandra [name] was placed on observation and sent to the hemodialysis unit for dialysis she promptly left the building after dialysis and I never had a chance to see her  Procedures:  Hemodialysis  Consultations:  Nephrology  Discharge Exam: Filed Vitals:   09/16/11 0540  BP: 182/60  Pulse: 71  Temp: 97.6 F (36.4 C)  Resp: 20   Filed Vitals:   09/16/11 0430 09/16/11 0500 09/16/11 0530 09/16/11 0540  BP: 163/56 206/63 147/75 182/60  Pulse: 58 65 72 71  Temp:    97.6 F (36.4 C)  TempSrc:    Oral  Resp: 16 20 16 20   Weight:    56 kg (123 lb 7.3 oz)  SpO2:    94%   Discharge Instructions   Medication List  As of 09/17/2011  3:53 PM   TAKE these medications         allopurinol 100 MG tablet   Commonly known as: ZYLOPRIM   Take 100 mg by mouth daily.      amLODipine 5 MG tablet   Commonly known as: NORVASC   Take 10 mg by mouth daily.      calcium acetate 667 MG capsule   Commonly known as: PHOSLO   Take 667 mg by mouth 4 (four) times daily.      cloNIDine 0.2 MG tablet   Commonly known as: CATAPRES   Take 0.2 mg by mouth at bedtime.      Coenzyme Q10 150 MG Caps   Take 300 mg by mouth daily.      colchicine 0.6 MG tablet   Take 0.6 mg by mouth daily as needed. For gout.      docusate sodium 100 MG capsule   Commonly known as: COLACE   Take 200 mg by mouth at bedtime.      hydrALAZINE 25 MG tablet   Commonly known as: APRESOLINE   Take 50 mg by mouth 3 (three) times daily.        l-methylfolate-B6-B12 3-35-2 MG Tabs   Commonly known as: METANX   Take 1 tablet by mouth 2 (two) times daily.      losartan 50 MG tablet   Commonly known as: COZAAR   Take 100 mg by mouth at bedtime.      metoprolol succinate 50 MG 24 hr tablet   Commonly known as: TOPROL-XL   Take 50 mg by mouth 2 (two) times daily. Does not take on dialysis days Monday Wednesday and Friday      multivitamin Tabs tablet   Take 1 tablet by mouth daily.      repaglinide 0.5 MG tablet   Commonly known as: PRANDIN   Take 0.5 mg by mouth 3 (three) times daily before meals.              The results of significant diagnostics from this hospitalization (including imaging, microbiology, ancillary and laboratory) are listed below for reference.    Significant Diagnostic Studies: No results found.  Microbiology: No results found for this or any previous visit (from  the past 240 hour(s)).   Labs: Basic Metabolic Panel:  Lab 09/16/11 1610 09/14/11 1104 09/11/11 0253  NA 137 133* 137  K 4.2 4.3 3.6  CL 94* 91* 95*  CO2 27 25 27   GLUCOSE 123* 83 167*  BUN 40* 72* 53*  CREATININE 6.44* 9.34* 7.25*  CALCIUM 9.8 10.0 9.6  MG -- -- --  PHOS 3.6 3.0 3.1   Liver Function Tests:  Lab 09/16/11 0222 09/14/11 1104 09/11/11 0253  AST -- -- --  ALT -- -- --  ALKPHOS -- -- --  BILITOT -- -- --  PROT -- -- --  ALBUMIN 3.3* 3.5 3.2*   No results found for this basename: LIPASE:5,AMYLASE:5 in the last 168 hours No results found for this basename: AMMONIA:5 in the last 168 hours CBC:  Lab 09/16/11 0222 09/14/11 1103 09/11/11 0253  WBC 5.8 7.4 6.1  NEUTROABS 2.2 3.3 2.6  HGB 10.2* 10.4* 10.0*  HCT 31.9* 32.0* 31.1*  MCV 73.3* 73.1* 74.8*  PLT 164 193 194   Cardiac Enzymes: No results found for this basename: CKTOTAL:5,CKMB:5,CKMBINDEX:5,TROPONINI:5 in the last 168 hours BNP: BNP (last 3 results) No results found for this basename: PROBNP:3 in the last 8760 hours CBG:  Lab  09/14/11 0239 09/11/11 0159  GLUCAP 112* 190*     SignedLonia Blood, MD  [facility] 09/17/2011, 3:53 PM

## 2011-09-18 ENCOUNTER — Other Ambulatory Visit (HOSPITAL_BASED_OUTPATIENT_CLINIC_OR_DEPARTMENT_OTHER): Payer: Self-pay | Admitting: Internal Medicine

## 2011-09-18 ENCOUNTER — Observation Stay (HOSPITAL_COMMUNITY): Payer: Medicare HMO

## 2011-09-18 DIAGNOSIS — I10 Essential (primary) hypertension: Secondary | ICD-10-CM

## 2011-09-18 DIAGNOSIS — N186 End stage renal disease: Secondary | ICD-10-CM

## 2011-09-18 DIAGNOSIS — M109 Gout, unspecified: Secondary | ICD-10-CM

## 2011-09-18 DIAGNOSIS — Z992 Dependence on renal dialysis: Secondary | ICD-10-CM

## 2011-09-18 LAB — CBC WITH DIFFERENTIAL/PLATELET
Eosinophils Relative: 3 % (ref 0–5)
HCT: 30.7 % — ABNORMAL LOW (ref 36.0–46.0)
Hemoglobin: 9.7 g/dL — ABNORMAL LOW (ref 12.0–15.0)
Lymphocytes Relative: 37 % (ref 12–46)
Lymphs Abs: 2 10*3/uL (ref 0.7–4.0)
MCV: 74.3 fL — ABNORMAL LOW (ref 78.0–100.0)
Monocytes Absolute: 0.7 10*3/uL (ref 0.1–1.0)
Neutro Abs: 2.6 10*3/uL (ref 1.7–7.7)
RBC: 4.13 MIL/uL (ref 3.87–5.11)
WBC: 5.4 10*3/uL (ref 4.0–10.5)

## 2011-09-18 MED ORDER — METOPROLOL SUCCINATE ER 50 MG PO TB24
50.0000 mg | ORAL_TABLET | Freq: Two times a day (BID) | ORAL | Status: DC
  Filled 2011-09-18 (×2): qty 1

## 2011-09-18 MED ORDER — HYDRALAZINE HCL 50 MG PO TABS
50.0000 mg | ORAL_TABLET | Freq: Three times a day (TID) | ORAL | Status: DC
  Filled 2011-09-18 (×3): qty 1

## 2011-09-18 MED ORDER — RENA-VITE PO TABS
1.0000 | ORAL_TABLET | Freq: Every day | ORAL | Status: DC
Start: 2011-09-18 — End: 2011-09-18
  Filled 2011-09-18: qty 1

## 2011-09-18 MED ORDER — CLONIDINE HCL 0.2 MG PO TABS
0.2000 mg | ORAL_TABLET | Freq: Every day | ORAL | Status: DC
  Filled 2011-09-18: qty 1

## 2011-09-18 MED ORDER — L-METHYLFOLATE-B6-B12 3-35-2 MG PO TABS
1.0000 | ORAL_TABLET | Freq: Two times a day (BID) | ORAL | Status: DC
Start: 2011-09-18 — End: 2011-09-18
  Filled 2011-09-18 (×2): qty 1

## 2011-09-18 MED ORDER — REPAGLINIDE 0.5 MG PO TABS
0.5000 mg | ORAL_TABLET | Freq: Three times a day (TID) | ORAL | Status: DC
  Filled 2011-09-18 (×4): qty 1

## 2011-09-18 MED ORDER — PARICALCITOL 5 MCG/ML IV SOLN
INTRAVENOUS | Status: AC
  Filled 2011-09-18: qty 1

## 2011-09-18 MED ORDER — PARICALCITOL 5 MCG/ML IV SOLN
1.0000 ug | Freq: Once | INTRAVENOUS | Status: DC
  Filled 2011-09-18: qty 0.2

## 2011-09-18 MED ORDER — COENZYME Q10 150 MG PO CAPS: 300.0000 mg | ORAL_CAPSULE | Freq: Every day | ORAL | Status: DC

## 2011-09-18 MED ORDER — ALLOPURINOL 100 MG PO TABS
100.0000 mg | ORAL_TABLET | Freq: Every day | ORAL | Status: DC
  Filled 2011-09-18: qty 1

## 2011-09-18 MED ORDER — COLCHICINE 0.6 MG PO TABS: 0.6000 mg | ORAL_TABLET | Freq: Every day | ORAL | Status: DC | PRN

## 2011-09-18 MED ORDER — CALCIUM ACETATE 667 MG PO CAPS
667.0000 mg | ORAL_CAPSULE | Freq: Three times a day (TID) | ORAL | Status: DC
  Filled 2011-09-18 (×5): qty 1

## 2011-09-18 MED ORDER — LOSARTAN POTASSIUM 50 MG PO TABS
100.0000 mg | ORAL_TABLET | Freq: Every day | ORAL | Status: DC
  Filled 2011-09-18: qty 2

## 2011-09-18 MED ORDER — DOCUSATE SODIUM 100 MG PO CAPS: 200.0000 mg | ORAL_CAPSULE | Freq: Every day | ORAL | Status: DC

## 2011-09-18 MED ORDER — AMLODIPINE BESYLATE 10 MG PO TABS
10.0000 mg | ORAL_TABLET | Freq: Every day | ORAL | Status: DC
  Filled 2011-09-18: qty 1

## 2011-09-18 NOTE — ED Notes (Signed)
Pt resting quietly, watching Tv. Requesting dialysis, states that they are ready for her. NAD, no other requests or complaints at this time.

## 2011-09-18 NOTE — H&P (Signed)
[facility]                                                                                    Patient Demographics  Alexandra [name], is a 66 y.o. female  CSN: [id number]  MRN: [id number]  DOB - June 11, 1945  Admit Date - 09/17/2011  Outpatient Primary MD for the patient is [name], MD   With History of -  Past Medical History  Diagnosis Date  . Chronic kidney disease     esrd  . Diabetes mellitus   . Hyperlipidemia   . Hypertension   . Renal disorder   . Dialysis care     tues, thurs, sat  . Gout due to renal impairment       Past Surgical History  Procedure Date  . Carotid endarterectomy 2002    left  . Btl   . Rectal fissurectomy   . Av fistula placement 10/30/2010    right brachiocephalic   . Fistulogram     in for   Chief Complaint  Patient presents with  . Vascular Access Problem    needs dialysis     HPI  Alexandra [name]  is a 66 y.o. female, with past medical history of chronic kidney disease on hemodialysis Tuesday Thursday Saturday, history of diabetes mellitus hyperlipidemia hypertension and gout, patient has no outpatient hemodialysis setup so comes to ED for hemodialysis care, patient denies any complaints of chest pain shortness of breath worsening of leg edema fever chills    Review of Systems    In addition to the HPI above,  No Fever-chills, No Headache, No changes with Vision or hearing, No problems swallowing food or Liquids, No Chest pain, Cough or Shortness of Breath, No Abdominal pain, No Nausea or Vommitting, Bowel movements are regular, No Blood in stool or Urine, No dysuria, No new skin rashes or bruises, No new joints pains-aches,  No new weakness, tingling, numbness in any extremity, No recent weight gain or loss, No polyuria, polydypsia or polyphagia, No significant Mental Stressors.  A full 10 point Review of Systems was done, except as stated above, all other Review of Systems were  negative.   Social History History  Substance Use Topics  . Smoking status: Current Everyday Smoker -- 1.0 packs/day for 50 years    Types: Cigarettes  . Smokeless tobacco: Never Used  . Alcohol Use: No     Family History Family History  Problem Relation Age of Onset  . COPD Mother   . Kidney disease Mother   . Heart disease Father   . Lymphoma Son      Prior to Admission medications   Medication Sig Start Date End Date Taking? Authorizing Provider  allopurinol (ZYLOPRIM) 100 MG tablet Take 100 mg by mouth daily.     Yes Historical Provider, MD  amLODipine (NORVASC) 5 MG tablet Take 10 mg by mouth daily. 07/13/11  Yes [name], MD  calcium acetate (PHOSLO) 667 MG capsule Take 667 mg by mouth 4 (four) times daily.    Yes Historical Provider, MD  cloNIDine (CATAPRES) 0.2 MG tablet Take 0.2 mg by mouth at bedtime.  Yes Historical Provider, MD  Coenzyme Q10 150 MG CAPS Take 300 mg by mouth daily.    Yes Historical Provider, MD  colchicine 0.6 MG tablet Take 0.6 mg by mouth daily as needed. For gout.   Yes Historical Provider, MD  docusate sodium (COLACE) 100 MG capsule Take 200 mg by mouth at bedtime.     Yes Historical Provider, MD  hydrALAZINE (APRESOLINE) 25 MG tablet Take 50 mg by mouth 3 (three) times daily.  07/18/11  Yes [name], MD  l-methylfolate-B6-B12 (METANX) 3-35-2 MG TABS Take 1 tablet by mouth 2 (two) times daily.    Yes Historical Provider, MD  losartan (COZAAR) 50 MG tablet Take 100 mg by mouth at bedtime.    Yes Historical Provider, MD  metoprolol succinate (TOPROL-XL) 50 MG 24 hr tablet Take 50 mg by mouth 2 (two) times daily. Does not take on dialysis days Monday Wednesday and Friday 07/18/11  Yes [name], MD  multivitamin (RENA-VIT) TABS tablet Take 1 tablet by mouth daily.     Yes Historical Provider, MD  repaglinide (PRANDIN) 0.5 MG tablet Take 0.5 mg by mouth 3 (three) times daily before meals.     Yes Historical Provider, MD    Allergies    Allergen Reactions  . Codeine Other (See Comments)    Passes out  . Epinephrine Other (See Comments)    Tachycardia, diaphoresis, syncope  . Percocet (Oxycodone-Acetaminophen) Other (See Comments)    Passes  out    Physical Exam  Vitals  Blood pressure 191/70, pulse 79, temperature 97.8 F (36.6 C), temperature source Oral, resp. rate 18, SpO2 98.00%.   1. General well-nourished lying in bed in NAD,   2. Normal affect and insight, Not Suicidal or Homicidal, Awake Alert, Oriented *3.  3. No F.N deficits, ALL C.Nerves Intact, Strength 5/5 all 4 extremities, Sensation intact all 4 extremities, Plantars down going.  4. Ears and Eyes appear Normal, Conjunctivae clear, PERRLA. Moist Oral Mucosa.  5. Supple Neck, No JVD, No cervical lymphadenopathy appriciated, No Carotid Bruits.  6. Symmetrical Chest wall movement, Good air movement bilaterally, CTAB. Right chest permacath  7. RRR, No Gallops, Rubs or Murmurs, No Parasternal Heave.  8. Positive Bowel Sounds, Abdomen Soft, Non tender, No organomegaly appriciated,       No rebound -guarding or rigidity.  9.  No Cyanosis, Normal Skin Turgor, No Skin Rash or Bruise. + Edema bilaterally  10. Good muscle tone,  joints appear normal , no effusions, Normal ROM.  11. No Palpable Lymph Nodes in Neck or Axillae   Data Review  CBC  Lab 09/16/11 0222 09/14/11 1103 09/11/11 0253  WBC 5.8 7.4 6.1  HGB 10.2* 10.4* 10.0*  HCT 31.9* 32.0* 31.1*  PLT 164 193 194  MCV 73.3* 73.1* 74.8*  MCH 23.4* 23.7* 24.0*  MCHC 32.0 32.5 32.2  RDW 20.0* 20.2* 20.1*  LYMPHSABS 2.4 3.0 2.4  MONOABS 1.0 0.9 0.9  EOSABS 0.2 0.2 0.2  BASOSABS 0.0 0.0 0.0  BANDABS -- -- --   ------------------------------------------------------------------------------------------------------------------  Chemistries   Lab 09/16/11 0222 09/14/11 1104 09/11/11 0253  NA 137 133* 137  K 4.2 4.3 3.6  CL 94* 91* 95*  CO2 27 25 27   GLUCOSE 123* 83 167*  BUN 40*  72* 53*  CREATININE 6.44* 9.34* 7.25*  CALCIUM 9.8 10.0 9.6  MG -- -- --  AST -- -- --  ALT -- -- --  ALKPHOS -- -- --  BILITOT -- -- --   ------------------------------------------------------------------------------------------------------------------  CrCl is unknown because both a height and weight (above a minimum accepted value) are required for this calculation. ------------------------------------------------------------------------------------------------------------------ No results found for this basename: TSH,T4TOTAL,FREET3,T3FREE,THYROIDAB in the last 72 hours   Coagulation profile No results found for this basename: INR:5,PROTIME:5 in the last 168 hours ------------------------------------------------------------------------------------------------------------------- No results found for this basename: DDIMER:2 in the last 72 hours -------------------------------------------------------------------------------------------------------------------  Cardiac Enzymes No results found for this basename: CK:3,CKMB:3,TROPONINI:3,MYOGLOBIN:3 in the last 168 hours ------------------------------------------------------------------------------------------------------------------ No components found with this basename: POCBNP:3   ---------------------------------------------------------------------------------------------------------------  Urinalysis    Component Value Date/Time   COLORURINE YELLOW 08/20/2009 2114   APPEARANCEUR HAZY* 08/20/2009 2114   LABSPEC >1.030* 08/20/2009 2114   PHURINE 6.0 08/20/2009 2114   GLUCOSEU NEGATIVE 08/20/2009 2114   HGBUR SMALL* 08/20/2009 2114   BILIRUBINUR NEGATIVE 08/20/2009 2114   KETONESUR NEGATIVE 08/20/2009 2114   PROTEINUR >300* 08/20/2009 2114   UROBILINOGEN 0.2 08/20/2009 2114   NITRITE NEGATIVE 08/20/2009 2114   LEUKOCYTESUR NEGATIVE 08/20/2009 2114     ---------------------------------------------------------------------------------------------------------------- Assessment & Plan  Active Problems:  ESRD (end stage renal disease) on dialysis  H/O: gout  Diabetes mellitus  HTN (hypertension), malignant    1. end-stage renal disease on hemodialysis. We'll admit patient for hemodialysis, labs were done has no complaints  2 gout. continue with allopurinol and when necessary 3. Hypertension. Uncontrolled continue with her medication , most likely will correct with hemodialysis if not we'll add when necessary hydralazine  4.Diabetes mellitus. Will have her on Accu-Chek controlled will start insulin sliding scale   DVT Prophylaxis Heparin   AM Labs Ordered, also please review Full Orders  Admission, patients condition and plan of care including tests being ordered have been discussed with the patient  who indicate understanding and agree with the plan and Code Status.    Condition GUARDED  ELGERGAWY, DAWOOD M.D on 09/18/2011 at 2:45 AM  Between 7am to 7pm - Pager - [phone]  After 7pm go to [email or web address] - password [email or web address]  And look for the night coverage person covering me after hours  [facility] Office  [phone]

## 2011-09-18 NOTE — ED Provider Notes (Signed)
History     CSN: [id number]  Arrival date & time 09/17/11  2333   First MD Initiated Contact with Patient 09/18/11 0104      Chief Complaint  Patient presents with  . Vascular Access Problem    needs dialysis    (Consider location/radiation/quality/duration/timing/severity/associated sxs/prior treatment) HPI Comments: Patient presents for routine dialysis, no other complaints  The history is provided by the patient and medical records.    Past Medical History  Diagnosis Date  . Chronic kidney disease     esrd  . Diabetes mellitus   . Hyperlipidemia   . Hypertension   . Renal disorder   . Dialysis care     tues, thurs, sat  . 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 Carotid endarterectomy 2002    left  . Btl   . Rectal fissurectomy   . Av fistula placement 10/30/2010    right brachiocephalic   . Fistulogram     Family History  Problem Relation Age of Onset  . COPD Mother   . Kidney disease Mother   . Heart disease Father   . Lymphoma Son     History  Substance Use Topics  . Smoking status: Current Everyday Smoker -- 1.0 packs/day for 50 years    Types: Cigarettes  . Smokeless tobacco: Never Used  . Alcohol Use: No    OB History    Grav Para Term Preterm Abortions TAB SAB Ect Mult Living                  Review of Systems  All other systems reviewed and are negative.    Allergies  Codeine; Epinephrine; and Percocet  Home Medications   Current Outpatient Rx  Name Route Sig Dispense Refill  . ALLOPURINOL 100 MG PO TABS Oral Take 100 mg by mouth daily.      Marland Kitchen AMLODIPINE BESYLATE 5 MG PO TABS Oral Take 10 mg by mouth daily.    Marland Kitchen CALCIUM ACETATE 667 MG PO CAPS Oral Take 667 mg by mouth 4 (four) times daily.     Marland Kitchen CLONIDINE HCL 0.2 MG PO TABS Oral Take 0.2 mg by mouth at bedtime.    Marland Kitchen COENZYME Q10 150 MG PO CAPS Oral Take 300 mg by mouth daily.     . COLCHICINE 0.6 MG PO TABS Oral Take 0.6 mg by mouth daily as  needed. For gout.    Marland Kitchen DOCUSATE SODIUM 100 MG PO CAPS Oral Take 200 mg by mouth at bedtime.      Marland Kitchen HYDRALAZINE HCL 25 MG PO TABS Oral Take 50 mg by mouth 3 (three) times daily.     . L-METHYLFOLATE-B6-B12 3-35-2 MG PO TABS Oral Take 1 tablet by mouth 2 (two) times daily.     Marland Kitchen LOSARTAN POTASSIUM 50 MG PO TABS Oral Take 100 mg by mouth at bedtime.     Marland Kitchen METOPROLOL SUCCINATE ER 50 MG PO TB24 Oral Take 50 mg by mouth 2 (two) times daily. Does not take on dialysis days Monday Wednesday and Friday    . RENA-VITE PO TABS Oral Take 1 tablet by mouth daily.      Marland Kitchen REPAGLINIDE 0.5 MG PO TABS Oral Take 0.5 mg by mouth 3 (three) times daily before meals.        BP 191/70  Pulse 79  Temp 97.8 F (36.6 C) (Oral)  Resp 18  SpO2 98%  Physical Exam  Nursing note and vitals reviewed. Constitutional: She appears well-developed and well-nourished. No distress.  HENT:  Head: Normocephalic and atraumatic.  Eyes: Conjunctivae are normal. No scleral icterus.  Neck: No JVD present.  Cardiovascular: Normal rate, regular rhythm and normal heart sounds.  Exam reveals no gallop and no friction rub.   No murmur heard. Pulmonary/Chest: Effort normal and breath sounds normal. No respiratory distress. She has no wheezes. She has no rales.  Musculoskeletal: Normal range of motion.  Neurological: She is alert. Coordination normal.  Skin: Skin is warm and dry. No rash noted. No erythema.  Psychiatric: She has a normal mood and affect. Her behavior is normal.    ED Course  Procedures (including critical care time)  Labs Reviewed  GLUCOSE, CAPILLARY - Abnormal; Notable for the following:    Glucose-Capillary 168 (*)     All other components within normal limits   No results found.   1. End stage renal disease       MDM  Patient is eating crackers with peanut butter, has no distress, no complaints, we paged hospitalist for admission, pending dialysis this evening.  D/w hospitalist - will  admit       Vida Roller, MD 09/18/11 [phone]

## 2011-09-18 NOTE — ED Notes (Signed)
Patient upset at disruption of plan of care that has been put into place.  Patient states she will speak with [facility] because she has not been "Fast Tracked" in almost a month.  She states she should not have to wait until morning to finish her dialysis when she gets here at 2300.

## 2011-09-18 NOTE — ED Notes (Signed)
Dr. Miller in to see patient at this time.

## 2011-09-18 NOTE — Progress Notes (Signed)
Pt. Expressed frustration with the wait time in the ED. Patient states she should not have to wait until Admission orders are entered in system to be treated in the hemodialysis meeting per there meeting she had with Hemo,Ed management, and the Hospitalist. Pt. Informed by Hemo RN per [facility] management Admission orders must be written prior to her being accepted to the Hemodialysis unit. Patient expressed anger insisting she does not have to wait, stating the Hemo management does not know what was discussed, patient states at the time of the meeting the [facility] manager had no leadership courses and is misinformed. Hemo RN informed patient of Hemo managents communication with nursing staff is Admission orders must be written prior to accepting her in this unit for hemodialysis treatment. Hemo informed this patient of the conflicting views of the patients version and managements version of meeting outcome.

## 2011-09-18 NOTE — ED Notes (Signed)
Patient continues to be angry on process of being admitted to HD.  Patient states that nurses are making decisions for the MD's.  Patient states that the process is different than what was spoken of in a meeting with the Chief [facility] and with Gareth Morgan, RN, Director of the ED.  Patient transported to HD via wheelchair.

## 2011-09-18 NOTE — ED Notes (Signed)
Patient moved to C28.  Patient resting quietly, watching TV.  Patient is waiting for dialysis and states that they are ready for her there.

## 2011-09-18 NOTE — ED Notes (Signed)
Dr Fonnie Jarvis notified of problems with admission of patient.

## 2011-09-18 NOTE — Progress Notes (Addendum)
Call from ED nurse [facility] informing me this patient has been seen by the hospitalist and admission orders are in the epic system. Epic system checked, no admission orders entered. Hemodialsis RN informed ED RN Elliot Gurney this patient cannot be accepted to our unit without admission orders in the epic system. ED RN acknowledged the orders she thought were entered were actually admission orders from 09/16/11. ED RN states she will call back once the hospitalist enters current admission orders.

## 2011-09-20 ENCOUNTER — Observation Stay (HOSPITAL_COMMUNITY)
Admission: EM | Admit: 2011-09-20 | Discharge: 2011-09-21 | Disposition: A | Discharge status: Home / Self Care | Payer: Medicare HMO | Attending: Internal Medicine | Admitting: Internal Medicine

## 2011-09-20 ENCOUNTER — Encounter (HOSPITAL_COMMUNITY): Payer: Self-pay | Admitting: *Deleted

## 2011-09-20 DIAGNOSIS — I120 Hypertensive chronic kidney disease with stage 5 chronic kidney disease or end stage renal disease: Secondary | ICD-10-CM | POA: Insufficient documentation

## 2011-09-20 DIAGNOSIS — E785 Hyperlipidemia, unspecified: Secondary | ICD-10-CM | POA: Insufficient documentation

## 2011-09-20 DIAGNOSIS — N186 End stage renal disease: Secondary | ICD-10-CM

## 2011-09-20 DIAGNOSIS — E119 Type 2 diabetes mellitus without complications: Secondary | ICD-10-CM | POA: Insufficient documentation

## 2011-09-20 DIAGNOSIS — Z992 Dependence on renal dialysis: Principal | ICD-10-CM | POA: Insufficient documentation

## 2011-09-20 NOTE — Progress Notes (Signed)
Utilization review completed.  

## 2011-09-20 NOTE — ED Provider Notes (Signed)
History     CSN: [id number]  Arrival date & time 09/20/11  2229   First MD Initiated Contact with Patient 09/20/11 2352      Chief Complaint  Patient presents with  . needs dialysis     (Consider location/radiation/quality/duration/timing/severity/associated sxs/prior treatment) HPI Comments: 26 Female with history of end-stage renal disease who presents for her "routine dialysis". She denies any complaints at this time  The history is provided by the patient.    Past Medical History  Diagnosis Date  . Chronic kidney disease     esrd  . Diabetes mellitus   . Hyperlipidemia   . Hypertension   . Renal disorder   . Dialysis care     tues, thurs, sat  . 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 Carotid endarterectomy 2002    left  . Btl   . Rectal fissurectomy   . Av fistula placement 10/30/2010    right brachiocephalic   . Fistulogram     Family History  Problem Relation Age of Onset  . COPD Mother   . Kidney disease Mother   . Heart disease Father   . Lymphoma Son     History  Substance Use Topics  . Smoking status: Current Everyday Smoker -- 1.0 packs/day for 50 years    Types: Cigarettes  . Smokeless tobacco: Never Used  . Alcohol Use: No    OB History    Grav Para Term Preterm Abortions TAB SAB Ect Mult Living                  Review of Systems  All other systems reviewed and are negative.    Allergies  Codeine; Epinephrine; and Percocet  Home Medications   Current Outpatient Rx  Name Route Sig Dispense Refill  . ALLOPURINOL 100 MG PO TABS Oral Take 100 mg by mouth daily.      Marland Kitchen AMLODIPINE BESYLATE 5 MG PO TABS Oral Take 10 mg by mouth daily.    Marland Kitchen CALCIUM ACETATE 667 MG PO CAPS Oral Take 667 mg by mouth 4 (four) times daily.     Marland Kitchen CLONIDINE HCL 0.2 MG PO TABS Oral Take 0.2 mg by mouth at bedtime.    Marland Kitchen COENZYME Q10 150 MG PO CAPS Oral Take 300 mg by mouth daily.     Marland Kitchen DOCUSATE SODIUM 100 MG PO CAPS Oral  Take 200 mg by mouth at bedtime.      Marland Kitchen HYDRALAZINE HCL 25 MG PO TABS Oral Take 50 mg by mouth 3 (three) times daily.     . L-METHYLFOLATE-B6-B12 3-35-2 MG PO TABS Oral Take 1 tablet by mouth 2 (two) times daily.     Marland Kitchen LOSARTAN POTASSIUM 50 MG PO TABS Oral Take 100 mg by mouth at bedtime.     Marland Kitchen METOPROLOL SUCCINATE ER 50 MG PO TB24 Oral Take 50 mg by mouth daily. Does not take on dialysis days Tues, Thurs, and Sat    . RENA-VITE PO TABS Oral Take 1 tablet by mouth daily.      Marland Kitchen REPAGLINIDE 0.5 MG PO TABS Oral Take 0.5 mg by mouth 3 (three) times daily before meals.        BP 206/57  Pulse 78  Temp 98.2 F (36.8 C)  Resp 20  SpO2 100%  Physical Exam  Nursing note and vitals reviewed. Constitutional: She appears well-developed and well-nourished. No distress.  HENT:  Head: Normocephalic and  atraumatic.  Eyes: Conjunctivae are normal. No scleral icterus.  Cardiovascular: Normal rate, regular rhythm, normal heart sounds and intact distal pulses.  Exam reveals no gallop and no friction rub.   No murmur heard. Pulmonary/Chest: Effort normal and breath sounds normal. No respiratory distress. She has no wheezes. She has no rales.  Neurological: She is alert.  Psychiatric: She has a normal mood and affect. Her behavior is normal.    ED Course  Procedures (including critical care time)  Labs Reviewed - No data to display No results found.   1. ESRD (end stage renal disease)       MDM  Patient is well appearing though she does have hypertension, she states that usually they resolve with dialysis, page hospitalist for admission for dialysis.  Discussed with hospitalist Dr. Selena Batten who will come to admit.      Vida Roller, MD 09/21/11 [phone]

## 2011-09-20 NOTE — ED Notes (Signed)
Here for dialysis 

## 2011-09-21 ENCOUNTER — Encounter (HOSPITAL_COMMUNITY): Payer: Self-pay | Admitting: Internal Medicine

## 2011-09-21 ENCOUNTER — Observation Stay (HOSPITAL_COMMUNITY): Payer: Medicare HMO

## 2011-09-21 LAB — CBC WITH DIFFERENTIAL/PLATELET
Basophils Absolute: 0 10*3/uL (ref 0.0–0.1)
Eosinophils Relative: 2 % (ref 0–5)
Lymphocytes Relative: 37 % (ref 12–46)
Lymphs Abs: 2.6 10*3/uL (ref 0.7–4.0)
MCV: 73 fL — ABNORMAL LOW (ref 78.0–100.0)
Monocytes Relative: 14 % — ABNORMAL HIGH (ref 3–12)
Platelets: 188 10*3/uL (ref 150–400)
RBC: 4.22 MIL/uL (ref 3.87–5.11)
RDW: 19.8 % — ABNORMAL HIGH (ref 11.5–15.5)
WBC: 7.1 10*3/uL (ref 4.0–10.5)

## 2011-09-21 LAB — RENAL FUNCTION PANEL
Albumin: 3.5 g/dL (ref 3.5–5.2)
CO2: 27 mEq/L (ref 19–32)
Chloride: 95 mEq/L — ABNORMAL LOW (ref 96–112)
GFR calc Af Amer: 5 mL/min — ABNORMAL LOW (ref >90)
GFR calc non Af Amer: 4 mL/min — ABNORMAL LOW (ref >90)
Potassium: 4.2 mEq/L (ref 3.5–5.1)
Sodium: 136 mEq/L (ref 135–145)

## 2011-09-21 MED ORDER — HEPARIN SODIUM (PORCINE) 1000 UNIT/ML DIALYSIS
1000.0000 [IU] | INTRAMUSCULAR | Status: DC | PRN
  Filled 2011-09-21: qty 1

## 2011-09-21 MED ORDER — HEPARIN SODIUM (PORCINE) 1000 UNIT/ML DIALYSIS
1150.0000 [IU] | INTRAMUSCULAR | Status: DC | PRN
  Administered 2011-09-21: 1200 [IU] via INTRAVENOUS_CENTRAL
  Filled 2011-09-21: qty 2

## 2011-09-21 MED ORDER — SODIUM CHLORIDE 0.9 % IJ SOLN: 3.0000 mL | Freq: Two times a day (BID) | INTRAMUSCULAR | Status: DC

## 2011-09-21 MED ORDER — NEPRO/CARBSTEADY PO LIQD
237.0000 mL | ORAL | Status: DC | PRN
  Filled 2011-09-21: qty 237

## 2011-09-21 MED ORDER — HYDRALAZINE HCL 20 MG/ML IJ SOLN
10.0000 mg | Freq: Four times a day (QID) | INTRAMUSCULAR | Status: DC | PRN
  Administered 2011-09-21: 10 mg via INTRAVENOUS
  Filled 2011-09-21: qty 1
  Filled 2011-09-21: qty 0.5

## 2011-09-21 MED ORDER — PARICALCITOL 5 MCG/ML IV SOLN
INTRAVENOUS | Status: AC
  Administered 2011-09-21: 1 ug via INTRAVENOUS
  Filled 2011-09-21: qty 1

## 2011-09-21 MED ORDER — SODIUM CHLORIDE 0.9 % IV SOLN: 250.0000 mL | INTRAVENOUS | Status: DC | PRN

## 2011-09-21 MED ORDER — PARICALCITOL 5 MCG/ML IV SOLN
1.0000 ug | Freq: Once | INTRAVENOUS | Status: AC
  Administered 2011-09-21: 1 ug via INTRAVENOUS
  Filled 2011-09-21: qty 0.2

## 2011-09-21 MED ORDER — SODIUM CHLORIDE 0.9 % IV SOLN: 100.0000 mL | INTRAVENOUS | Status: DC | PRN

## 2011-09-21 MED ORDER — SODIUM CHLORIDE 0.9 % IJ SOLN: 3.0000 mL | INTRAMUSCULAR | Status: DC | PRN

## 2011-09-21 MED ORDER — HYDRALAZINE HCL 20 MG/ML IJ SOLN
10.0000 mg | Freq: Once | INTRAMUSCULAR | Status: AC
  Administered 2011-09-21: 10 mg via INTRAVENOUS
  Filled 2011-09-21: qty 0.5

## 2011-09-21 MED ORDER — ALTEPLASE 2 MG IJ SOLR
2.0000 mg | Freq: Once | INTRAMUSCULAR | Status: DC | PRN
  Filled 2011-09-21: qty 2

## 2011-09-21 NOTE — ED Notes (Signed)
Report called to dialysis Tore RN ok to send pt

## 2011-09-21 NOTE — Discharge Summary (Signed)
Short discharge note:  Ms. [name] is a 66 yo AAF who presents to [facility] for dialysis. This is a 3 times a week session as apparently, she has been dismissed from outpt HD facilities and now must come to ED and have HD here at [facility]. Her HD has gone well tonight. Her BP has been up, and she has received IV Hydralazine prn. Her home dose of Catapres is due this a.m. and she will take her own med. She has had no difficulties during HD session tonight. No c/o chest pain or SOB with the hypertension.   Her regular nephro care is provided by Dr. Bascom Levels who gave orders for the HD tonight.   She feels well and is ready for d/c home.   Instructions:  Resume home meds. F/up with Dr. Bascom Levels and keep regular HD schedule. Call Dr. Bascom Levels for any concerning symptoms.   Maren Reamer, NP [facility]

## 2011-09-21 NOTE — H&P (Addendum)
Alexandra [name] is an 66 y.o. female.    Pcp: Duane Lope Nephrologist:  Jeri Cos  Chief Complaint:  I need dialysis HPI: 66 yo female with Dm2, ESRD on HD TTS needs dialysis.  Denies cp, palp, sob, n/v, ab dpain, diarrhea, brbpr, black stool,  Dysuria, hematuria.   Past Medical History  Diagnosis Date  . Chronic kidney disease     esrd  . Diabetes mellitus   . Hyperlipidemia   . Hypertension   . Renal disorder   . Dialysis care     tues, thurs, sat  . 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 Carotid endarterectomy 2002    left  . Btl   . Rectal fissurectomy   . Av fistula placement 10/30/2010    right brachiocephalic   . Fistulogram     Family History  Problem Relation Age of Onset  . COPD Mother   . Kidney disease Mother   . Heart disease Father   . Lymphoma Son    Social History:  reports that she has been smoking Cigarettes.  She has a 50 pack-year smoking history. She has never used smokeless tobacco. She reports that she does not drink alcohol or use illicit drugs.  Allergies:  Allergies  Allergen Reactions  . Codeine Other (See Comments)    Passes out  . Epinephrine Other (See Comments)    Tachycardia, diaphoresis, syncope  . Percocet (Oxycodone-Acetaminophen) Other (See Comments)    Passes  out     (Not in a hospital admission)  No results found for this or any previous visit (from the past 48 hour(s)). No results found.  Review of Systems  Constitutional: Negative for fever, chills, weight loss, malaise/fatigue and diaphoresis.  HENT: Negative for hearing loss, ear pain, nosebleeds, congestion, sore throat, neck pain, tinnitus and ear discharge.   Eyes: Negative for blurred vision, double vision, photophobia, pain, discharge and redness.  Respiratory: Negative for cough, hemoptysis, sputum production, shortness of breath, wheezing and stridor.   Cardiovascular: Negative for chest pain, palpitations,  orthopnea, claudication, leg swelling and PND.  Gastrointestinal: Negative for heartburn, nausea, vomiting, abdominal pain, diarrhea, constipation, blood in stool and melena.  Genitourinary: Negative for dysuria, urgency, frequency, hematuria and flank pain.  Musculoskeletal: Negative for myalgias, back pain, joint pain and falls.  Skin: Negative for itching and rash.  Neurological: Negative for dizziness, tingling, tremors, sensory change, speech change, focal weakness, seizures, weakness and headaches.  Endo/Heme/Allergies: Negative for environmental allergies. Does not bruise/bleed easily.  Psychiatric/Behavioral: Negative for depression, suicidal ideas, hallucinations and substance abuse. The patient is not nervous/anxious.     Blood pressure 206/57, pulse 78, temperature 98.2 F (36.8 C), resp. rate 20, SpO2 100.00%. Physical Exam  Constitutional: She is oriented to person, place, and time. She appears well-developed and well-nourished.  HENT:  Head: Normocephalic and atraumatic.  Mouth/Throat: No oropharyngeal exudate.  Eyes: Conjunctivae are normal. Pupils are equal, round, and reactive to light. Right eye exhibits no discharge. Left eye exhibits no discharge. No scleral icterus.  Neck: Normal range of motion. Neck supple. No JVD present. No tracheal deviation present. No thyromegaly present.  Cardiovascular: Normal rate and regular rhythm.  Exam reveals no gallop and no friction rub.   No murmur heard. Respiratory: Effort normal and breath sounds normal. No stridor. No respiratory distress. She has no wheezes. She has no rales. She exhibits no tenderness.  GI: Soft. Bowel sounds are normal. She  exhibits no distension and no mass. There is no tenderness. There is no rebound and no guarding.  Musculoskeletal: Normal range of motion. She exhibits no edema and no tenderness.  Lymphadenopathy:    She has no cervical adenopathy.  Neurological: She is alert and oriented to person, place,  and time. She has normal reflexes. She displays normal reflexes. No cranial nerve deficit. She exhibits normal muscle tone. Coordination normal.  Skin: Skin is warm and dry. No rash noted. No erythema. No pallor.  Psychiatric: She has a normal mood and affect. Her behavior is normal. Judgment and thought content normal.      Assessment/Plan  #1. ESRD on hemodialysis - dialysis per nephrologist.  #2. Uncontrolled hypertension - continue home medications. Keep patient on IV hydralazine when necessary for systolic blood pressure 160. I think blood pressure will improve with dialysis.  #3. Diabetes mellitus2 - continue home medications and sliding scale coverage.  CODE STATUS - full code.   Pearson Grippe 09/21/2011, 12:31 AM   Dr Bascom Levels aware of pt and will be by, I have consulted him

## 2011-09-22 ENCOUNTER — Observation Stay (HOSPITAL_COMMUNITY)
Admission: EM | Admit: 2011-09-22 | Discharge: 2011-09-23 | Disposition: A | Discharge status: Home / Self Care | Payer: Medicare HMO | Attending: Internal Medicine | Admitting: Internal Medicine

## 2011-09-22 ENCOUNTER — Encounter (HOSPITAL_COMMUNITY): Payer: Self-pay | Admitting: Emergency Medicine

## 2011-09-22 DIAGNOSIS — Z992 Dependence on renal dialysis: Principal | ICD-10-CM | POA: Insufficient documentation

## 2011-09-22 DIAGNOSIS — E785 Hyperlipidemia, unspecified: Secondary | ICD-10-CM | POA: Insufficient documentation

## 2011-09-22 DIAGNOSIS — I10 Essential (primary) hypertension: Secondary | ICD-10-CM | POA: Diagnosis present

## 2011-09-22 DIAGNOSIS — E119 Type 2 diabetes mellitus without complications: Secondary | ICD-10-CM | POA: Insufficient documentation

## 2011-09-22 DIAGNOSIS — N186 End stage renal disease: Secondary | ICD-10-CM

## 2011-09-22 DIAGNOSIS — I120 Hypertensive chronic kidney disease with stage 5 chronic kidney disease or end stage renal disease: Secondary | ICD-10-CM | POA: Insufficient documentation

## 2011-09-22 MED ORDER — HEPARIN SODIUM (PORCINE) 1000 UNIT/ML DIALYSIS
1100.0000 [IU] | INTRAMUSCULAR | Status: DC | PRN
  Filled 2011-09-22: qty 2

## 2011-09-22 NOTE — ED Notes (Signed)
REPORT GIVEN TO COREY RN AT DIALYSIS , TRANSPORTED TO DIALYSIS BY NT IN STABLE CONDITION .

## 2011-09-22 NOTE — ED Notes (Signed)
DR. Preston Fleeting AT BEDSIDE SPEAKING WITH PT.

## 2011-09-22 NOTE — Consult Note (Signed)
  Pt. In ER pt. Stable. Dialysis orders written,

## 2011-09-22 NOTE — ED Notes (Signed)
Here for routine dialysis and no issues reported.

## 2011-09-22 NOTE — H&P (Signed)
[facility] History and Physical  Alexandra [name] WUJ:[id number] DOB: 02-26-1946 DOA: 09/22/2011  Referring physician: Dr Preston Fleeting PCP: [name], MD   Chief Complaint: here for hemodialysis  HPI:  66 year-old female with known history of ESRD on hemodialysis has come for her regular dialysis. Denies any chest pain shortness of breath. Has no specific complaints.   Review of Systems:  Constitutional: Denies fever, chills, diaphoresis, appetite change and fatigue.  HEENT: Denies photophobia, eye pain, redness, hearing loss, ear pain, congestion, sore throat, rhinorrhea, sneezing, mouth sores, trouble swallowing, neck pain, neck stiffness and tinnitus.   Respiratory: Denies SOB, DOE, cough, chest tightness,  and wheezing.   Cardiovascular: Denies chest pain, palpitations and leg swelling.  Gastrointestinal: Denies nausea, vomiting, abdominal pain, diarrhea, constipation, blood in stool and abdominal distention.  Genitourinary: Denies dysuria, urgency, frequency, hematuria, flank pain and difficulty urinating.  Musculoskeletal: Denies myalgias, back pain, joint swelling, arthralgias and gait problem.  Skin: Denies pallor, rash and wound.  Neurological: Denies dizziness, seizures, syncope, weakness, light-headedness, numbness and headaches.  Hematological: Denies adenopathy. Easy bruising, personal or family bleeding history  Psychiatric/Behavioral: Denies suicidal ideation, mood changes, confusion, nervousness, sleep disturbance and agitation   Past Medical History  Diagnosis Date  . Chronic kidney disease     esrd  . Diabetes mellitus   . Hyperlipidemia   . Hypertension   . Renal disorder   . Dialysis care     tues, thurs, sat  . Gout due to renal impairment    Past Surgical History  Procedure Date  . Carotid endarterectomy 2002    left  . Btl   . Rectal fissurectomy   . Av fistula placement 10/30/2010    right brachiocephalic   . Fistulogram    Social  History:  reports that she has been smoking Cigarettes.  She has a 50 pack-year smoking history. She has never used smokeless tobacco. She reports that she does not drink alcohol or use illicit drugs.  Allergies  Allergen Reactions  . Codeine Other (See Comments)    Passes out  . Epinephrine Other (See Comments)    Tachycardia, diaphoresis, syncope  . Percocet (Oxycodone-Acetaminophen) Other (See Comments)    Passes  out    Family History  Problem Relation Age of Onset  . COPD Mother   . Kidney disease Mother   . Heart disease Father   . Lymphoma Son     Prior to Admission medications   Medication Sig Start Date End Date Taking? Authorizing Provider  allopurinol (ZYLOPRIM) 100 MG tablet Take 100 mg by mouth daily.     Yes Historical Provider, MD  amLODipine (NORVASC) 5 MG tablet Take 10 mg by mouth daily. 07/13/11  Yes [name], MD  calcium acetate (PHOSLO) 667 MG capsule Take 667 mg by mouth 4 (four) times daily.    Yes Historical Provider, MD  cloNIDine (CATAPRES) 0.2 MG tablet Take 0.2 mg by mouth at bedtime.   Yes Historical Provider, MD  Coenzyme Q10 150 MG CAPS Take 300 mg by mouth daily. Hold while in hospital   Yes Historical Provider, MD  docusate sodium (COLACE) 100 MG capsule Take 200 mg by mouth at bedtime.     Yes Historical Provider, MD  hydrALAZINE (APRESOLINE) 25 MG tablet Take 50 mg by mouth 3 (three) times daily.  07/18/11  Yes [name], MD  l-methylfolate-B6-B12 (METANX) 3-35-2 MG TABS Take 1 tablet by mouth 2 (two) times daily.    Yes Historical  Provider, MD  losartan (COZAAR) 50 MG tablet Take 100 mg by mouth at bedtime.    Yes Historical Provider, MD  metoprolol succinate (TOPROL-XL) 50 MG 24 hr tablet Take 50 mg by mouth daily. Does not take on dialysis days Ulanda Edison, and Sat 07/18/11  Yes [name], MD  multivitamin (RENA-VIT) TABS tablet Take 1 tablet by mouth daily.     Yes Historical Provider, MD  repaglinide (PRANDIN) 0.5 MG tablet Take 0.5  mg by mouth 3 (three) times daily before meals.     Yes Historical Provider, MD    Physical Exam:  Filed Vitals:   09/22/11 2112  BP: 207/57  Pulse: 87  Temp: 98.8 F (37.1 C)  TempSrc: Oral  Resp: 20    Constitutional: Vital signs reviewed.  Patient is a well-developed and well-nourished in no acute distress and cooperative with exam. Alert and oriented x3.  Head: Normocephalic and atraumatic Ear: TM normal bilaterally Mouth: no erythema or exudates, MMM Eyes: PERRL, EOMI, conjunctivae normal, No scleral icterus.  Neck: Supple, Trachea midline normal ROM, No JVD, mass, thyromegaly, or carotid bruit present.  Cardiovascular: RRR, S1 normal, S2 normal, no MRG, pulses symmetric and intact bilaterally Pulmonary/Chest: CTAB, no wheezes, rales, or rhonchi Abdominal: Soft. Non-tender, non-distended, bowel sounds are normal, no masses, organomegaly, or guarding present.  GU: no CVA tenderness Musculoskeletal: No joint deformities, erythema, or stiffness, ROM full and no nontender Ext: no edema and no cyanosis, pulses palpable bilaterally (DP and PT) Hematology: no cervical, inginal, or axillary adenopathy.  Neurological: A&O x3, Strenght is normal and symmetric bilaterally, cranial nerve II-XII are grossly intact, no focal motor deficit, sensory intact to light touch bilaterally.  Skin: Warm, dry and intact. No rash, cyanosis, or clubbing.  Psychiatric: Normal mood and affect. speech and behavior is normal. Judgment and thought content normal. Cognition and memory are normal.   Labs on Admission:  Basic Metabolic Panel:  Lab 09/21/11 2725 09/16/11 0222  NA 136 137  K 4.2 4.2  CL 95* 94*  CO2 27 27  GLUCOSE 164* 123*  BUN 71* 40*  CREATININE 8.63* 6.44*  CALCIUM 10.1 9.8  MG -- --  PHOS 2.8 3.6   Liver Function Tests:  Lab 09/21/11 0106 09/16/11 0222  AST -- --  ALT -- --  ALKPHOS -- --  BILITOT -- --  PROT -- --  ALBUMIN 3.5 3.3*   No results found for this basename:  LIPASE:5,AMYLASE:5 in the last 168 hours No results found for this basename: AMMONIA:5 in the last 168 hours CBC:  Lab 09/21/11 0106 09/18/11 0630 09/16/11 0222  WBC 7.1 5.4 5.8  NEUTROABS 3.4 2.6 2.2  HGB 9.9* 9.7* 10.2*  HCT 30.8* 30.7* 31.9*  MCV 73.0* 74.3* 73.3*  PLT 188 149* 164   Cardiac Enzymes: No results found for this basename: CKTOTAL:5,CKMB:5,CKMBINDEX:5,TROPONINI:5 in the last 168 hours BNP: No components found with this basename: POCBNP:5 CBG:  Lab 09/17/11 2346  GLUCAP 168*    Radiological Exams on Admission: No results found.    Assessment/Plan #1. ESRD on hemodialysis - Admit to observation  Dr Bascom Levels informed. dialysis per nephrologist.   #2. Uncontrolled hypertension  - noted for elevated BP.   continue home medications.  IV hydralazine prn for SBP>=160 mmhg.   3. Diabetes mellitus Type 2   - continue home medications and sliding scale coverage.  Code Status: full  Disposition Plan: home after HD  Eddie North [facility] Pager336 [phone]  If 7PM-7AM, please  contact night-coverage [email or web address] Password [facility] 09/22/2011, 10:56 PM   Total time 50 minutes

## 2011-09-22 NOTE — ED Notes (Signed)
DR. Marya Landry AT BEDSIDE EVALUATING PT.

## 2011-09-22 NOTE — ED Provider Notes (Signed)
History     CSN: [id number]  Arrival date & time 09/22/11  2102   First MD Initiated Contact with Patient 09/22/11 2120      Chief Complaint  Patient presents with  . Vascular Access Problem    (Consider location/radiation/quality/duration/timing/severity/associated sxs/prior treatment) The history is provided by the patient.   66 year old female with end-stage renal disease on hemodialysis presents for routine dialysis. She denies any complaints patient denies chest pain, heaviness, tightness, pressure and denies dyspnea, nausea, vomiting. She is not complaining of any pain anywhere.  Past Medical History  Diagnosis Date  . Chronic kidney disease     esrd  . Diabetes mellitus   . Hyperlipidemia   . Hypertension   . Renal disorder   . Dialysis care     tues, thurs, sat  . 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 Carotid endarterectomy 2002    left  . Btl   . Rectal fissurectomy   . Av fistula placement 10/30/2010    right brachiocephalic   . Fistulogram     Family History  Problem Relation Age of Onset  . COPD Mother   . Kidney disease Mother   . Heart disease Father   . Lymphoma Son     History  Substance Use Topics  . Smoking status: Current Everyday Smoker -- 1.0 packs/day for 50 years    Types: Cigarettes  . Smokeless tobacco: Never Used  . Alcohol Use: No    OB History    Grav Para Term Preterm Abortions TAB SAB Ect Mult Living                  Review of Systems  All other systems reviewed and are negative.    Allergies  Codeine; Epinephrine; and Percocet  Home Medications   Current Outpatient Rx  Name Route Sig Dispense Refill  . ALLOPURINOL 100 MG PO TABS Oral Take 100 mg by mouth daily.      Marland Kitchen AMLODIPINE BESYLATE 5 MG PO TABS Oral Take 10 mg by mouth daily.    Marland Kitchen CALCIUM ACETATE 667 MG PO CAPS Oral Take 667 mg by mouth 4 (four) times daily.     Marland Kitchen CLONIDINE HCL 0.2 MG PO TABS Oral Take 0.2 mg by mouth  at bedtime.    Marland Kitchen COENZYME Q10 150 MG PO CAPS Oral Take 300 mg by mouth daily. Hold while in hospital    . DOCUSATE SODIUM 100 MG PO CAPS Oral Take 200 mg by mouth at bedtime.      Marland Kitchen HYDRALAZINE HCL 25 MG PO TABS Oral Take 50 mg by mouth 3 (three) times daily.     . L-METHYLFOLATE-B6-B12 3-35-2 MG PO TABS Oral Take 1 tablet by mouth 2 (two) times daily.     Marland Kitchen LOSARTAN POTASSIUM 50 MG PO TABS Oral Take 100 mg by mouth at bedtime.     Marland Kitchen METOPROLOL SUCCINATE ER 50 MG PO TB24 Oral Take 50 mg by mouth daily. Does not take on dialysis days Tues, Thurs, and Sat    . RENA-VITE PO TABS Oral Take 1 tablet by mouth daily.      Marland Kitchen REPAGLINIDE 0.5 MG PO TABS Oral Take 0.5 mg by mouth 3 (three) times daily before meals.        BP 207/57  Pulse 87  Temp 98.8 F (37.1 C) (Oral)  Resp 20  Physical Exam  Nursing note and vitals reviewed.  66 year old female who is resting comfortably in no acute distress. Vital signs are significant for hypertension with blood pressure 207/57. Head is no stock and atraumatic. PERRLA, EOMI. Neck is nontender and supple. Lungs are clear without rales, wheezes, rhonchi. Heart has regular rate rhythm without murmur. Chest has hemodialysis access catheters in the right subclavian region. Abdomen is soft, flat, nontender. Extremities have full range of motion, no cyanosis or edema. Skin is warm and dry without rash. Neurologic: Mental status is normal, cranial nerves are intact, there are no gross motor deficits.  ED Course  Procedures (including critical care time)    1. ESRD on hemodialysis   2. Hypertension       MDM  End-stage renal disease on hemodialysis. She will be admitted for dialysis. Old charts have been reviewed and she has multiple visits for the same complaint.  Case is discussed with Dr. Gonzella Lex of [facility] who agrees to admit the patient.  Dione Booze, MD 09/22/11 2218

## 2011-09-22 NOTE — ED Notes (Signed)
PT. IS HERE FOR HER ROUTINE HEMODIALYSIS , PROCESS EXPLAINED , PT. WAITING FOR ADMITTING MD .

## 2011-09-22 NOTE — ED Notes (Signed)
HOT CUP OF WATER GIVEN TO PT. PER REQUEST FOR HER TEA.

## 2011-09-23 ENCOUNTER — Observation Stay (HOSPITAL_COMMUNITY): Payer: Medicare HMO

## 2011-09-23 LAB — CBC WITH DIFFERENTIAL/PLATELET
Eosinophils Relative: 2 % (ref 0–5)
HCT: 31.2 % — ABNORMAL LOW (ref 36.0–46.0)
Hemoglobin: 9.9 g/dL — ABNORMAL LOW (ref 12.0–15.0)
Lymphocytes Relative: 34 % (ref 12–46)
Lymphs Abs: 2.4 10*3/uL (ref 0.7–4.0)
MCV: 73.2 fL — ABNORMAL LOW (ref 78.0–100.0)
Monocytes Absolute: 0.9 10*3/uL (ref 0.1–1.0)
Monocytes Relative: 12 % (ref 3–12)
Neutro Abs: 3.6 10*3/uL (ref 1.7–7.7)
RBC: 4.26 MIL/uL (ref 3.87–5.11)
RDW: 20 % — ABNORMAL HIGH (ref 11.5–15.5)
WBC: 7.1 10*3/uL (ref 4.0–10.5)

## 2011-09-23 LAB — COMPREHENSIVE METABOLIC PANEL
AST: 35 U/L (ref 0–37)
Albumin: 3.6 g/dL (ref 3.5–5.2)
Alkaline Phosphatase: 129 U/L — ABNORMAL HIGH (ref 39–117)
BUN: 54 mg/dL — ABNORMAL HIGH (ref 6–23)
Potassium: 4 mEq/L (ref 3.5–5.1)
Total Bilirubin: 0.2 mg/dL — ABNORMAL LOW (ref 0.3–1.2)
Total Protein: 7.5 g/dL (ref 6.0–8.3)

## 2011-09-23 LAB — LIPID PANEL
LDL Cholesterol: 70 mg/dL (ref 0–99)
Total CHOL/HDL Ratio: 2.2 RATIO
VLDL: 16 mg/dL (ref 0–40)

## 2011-09-23 LAB — GLUCOSE, CAPILLARY: Glucose-Capillary: 90 mg/dL (ref 70–99)

## 2011-09-23 LAB — HEPATITIS B SURFACE ANTIGEN: Hepatitis B Surface Ag: NEGATIVE

## 2011-09-23 MED ORDER — DARBEPOETIN ALFA-POLYSORBATE 100 MCG/0.5ML IJ SOLN
100.0000 ug | Freq: Once | INTRAMUSCULAR | Status: AC
  Administered 2011-09-23: 100 ug via INTRAVENOUS
  Filled 2011-09-23: qty 0.5

## 2011-09-23 MED ORDER — PARICALCITOL 5 MCG/ML IV SOLN
INTRAVENOUS | Status: AC
  Administered 2011-09-23: 1 ug via INTRAVENOUS
  Filled 2011-09-23: qty 1

## 2011-09-23 MED ORDER — PARICALCITOL 5 MCG/ML IV SOLN
1.0000 ug | Freq: Once | INTRAVENOUS | Status: AC
  Administered 2011-09-23: 1 ug via INTRAVENOUS
  Filled 2011-09-23: qty 0.2

## 2011-09-23 MED ORDER — DARBEPOETIN ALFA-POLYSORBATE 100 MCG/0.5ML IJ SOLN
INTRAMUSCULAR | Status: AC
  Administered 2011-09-23: 100 ug via INTRAVENOUS
  Filled 2011-09-23: qty 0.5

## 2011-09-23 MED ORDER — HYDRALAZINE HCL 20 MG/ML IJ SOLN
10.0000 mg | Freq: Four times a day (QID) | INTRAMUSCULAR | Status: DC | PRN
  Administered 2011-09-23: 10 mg via INTRAVENOUS
  Filled 2011-09-23: qty 1

## 2011-09-23 NOTE — Progress Notes (Signed)
Call to Dr. Bascom Levels per patient request to receive Aranesp tonight. Dr. Bascom Levels ordered Aranesp  I.V. to be administered once.

## 2011-09-23 NOTE — Progress Notes (Signed)
Blood Glucose=90, pt. Is eating a chocolate candy bar.

## 2011-09-23 NOTE — Progress Notes (Signed)
Pt. Requests to know her blood sugar, per lab draw @ 0023 glucose=180. Pt. States she will take her Prandin and self administers medication. Requests to have her blood glucose checked.

## 2011-09-24 ENCOUNTER — Encounter (HOSPITAL_COMMUNITY): Payer: Self-pay | Admitting: Emergency Medicine

## 2011-09-24 DIAGNOSIS — E785 Hyperlipidemia, unspecified: Secondary | ICD-10-CM | POA: Insufficient documentation

## 2011-09-24 DIAGNOSIS — Z992 Dependence on renal dialysis: Principal | ICD-10-CM | POA: Insufficient documentation

## 2011-09-24 DIAGNOSIS — I120 Hypertensive chronic kidney disease with stage 5 chronic kidney disease or end stage renal disease: Secondary | ICD-10-CM | POA: Insufficient documentation

## 2011-09-24 DIAGNOSIS — E119 Type 2 diabetes mellitus without complications: Secondary | ICD-10-CM | POA: Insufficient documentation

## 2011-09-24 DIAGNOSIS — N186 End stage renal disease: Secondary | ICD-10-CM | POA: Insufficient documentation

## 2011-09-24 NOTE — ED Notes (Signed)
Here for dialysis; denies SOB; pt a&ox4, pt in NAD

## 2011-09-25 ENCOUNTER — Observation Stay (HOSPITAL_COMMUNITY): Payer: Medicare HMO

## 2011-09-25 ENCOUNTER — Observation Stay (HOSPITAL_COMMUNITY)
Admission: EM | Admit: 2011-09-25 | Discharge: 2011-09-25 | Disposition: A | Discharge status: Home / Self Care | Payer: Medicare HMO | Attending: Family Medicine | Admitting: Family Medicine

## 2011-09-25 DIAGNOSIS — N186 End stage renal disease: Secondary | ICD-10-CM

## 2011-09-25 LAB — RENAL FUNCTION PANEL
Albumin: 3.5 g/dL (ref 3.5–5.2)
BUN: 63 mg/dL — ABNORMAL HIGH (ref 6–23)
Calcium: 10.2 mg/dL (ref 8.4–10.5)
Chloride: 99 mEq/L (ref 96–112)
Creatinine, Ser: 6.93 mg/dL — ABNORMAL HIGH (ref 0.50–1.10)
GFR calc non Af Amer: 5 mL/min — ABNORMAL LOW (ref >90)

## 2011-09-25 LAB — CBC
HCT: 31 % — ABNORMAL LOW (ref 36.0–46.0)
MCH: 23 pg — ABNORMAL LOW (ref 26.0–34.0)
MCV: 72.8 fL — ABNORMAL LOW (ref 78.0–100.0)
Platelets: 164 10*3/uL (ref 150–400)
RDW: 19.8 % — ABNORMAL HIGH (ref 11.5–15.5)

## 2011-09-25 MED ORDER — HEPARIN SODIUM (PORCINE) 1000 UNIT/ML DIALYSIS
1000.0000 [IU] | INTRAMUSCULAR | Status: DC | PRN
  Filled 2011-09-25: qty 1

## 2011-09-25 MED ORDER — PARICALCITOL 5 MCG/ML IV SOLN
1.0000 ug | Freq: Once | INTRAVENOUS | Status: AC
  Administered 2011-09-25: 1 ug via INTRAVENOUS
  Filled 2011-09-25: qty 0.2

## 2011-09-25 MED ORDER — SODIUM CHLORIDE 0.9 % IV SOLN: 100.0000 mL | INTRAVENOUS | Status: DC | PRN

## 2011-09-25 MED ORDER — ALTEPLASE 2 MG IJ SOLR
2.0000 mg | Freq: Once | INTRAMUSCULAR | Status: DC | PRN
  Filled 2011-09-25: qty 2

## 2011-09-25 MED ORDER — PARICALCITOL 5 MCG/ML IV SOLN
INTRAVENOUS | Status: AC
  Administered 2011-09-25: 1 ug via INTRAVENOUS
  Filled 2011-09-25: qty 1

## 2011-09-25 MED ORDER — NEPRO/CARBSTEADY PO LIQD
237.0000 mL | ORAL | Status: DC | PRN
  Filled 2011-09-25: qty 237

## 2011-09-25 MED ORDER — HEPARIN SODIUM (PORCINE) 1000 UNIT/ML DIALYSIS
20.0000 [IU]/kg | INTRAMUSCULAR | Status: DC | PRN
  Filled 2011-09-25: qty 2

## 2011-09-25 NOTE — ED Notes (Signed)
Pt given waster for tea and graham crackers. Spoke with Hemodialysis and they stated they are waiting on hemodialysis orders from Dr. Marya Landry. Admitting MD made aware

## 2011-09-25 NOTE — H&P (Signed)
PCP:   [name], MD   Chief Complaint:  Dialysis  HPI: This is a 66 year old female with end stage renal disease, diabetes mellitus, hypertension who presents to the emergency department for routine hemodialysis due to end-stage renal disease. She has no complaints today.  Review of Systems:  The patient denies anorexia, fever, weight loss,, vision loss, decreased hearing, hoarseness, chest pain, syncope, dyspnea on exertion, balance deficits, hemoptysis, abdominal pain, melena, hematochezia, severe indigestion/heartburn, hematuria, incontinence, genital sores, muscle weakness, suspicious skin lesions, transient blindness, difficulty walking, depression, unusual weight change, abnormal bleeding, enlarged lymph nodes, angioedema, and breast masses.  Past Medical History: Past Medical History  Diagnosis Date  . Chronic kidney disease     esrd  . Diabetes mellitus   . Hyperlipidemia   . Hypertension   . Renal disorder   . Dialysis care     tues, thurs, sat  . Gout due to renal impairment    Past Surgical History  Procedure Date  . Carotid endarterectomy 2002    left  . Btl   . Rectal fissurectomy   . Av fistula placement 10/30/2010    right brachiocephalic   . Fistulogram     Medications: Prior to Admission medications   Medication Sig Start Date End Date Taking? Authorizing Provider  allopurinol (ZYLOPRIM) 100 MG tablet Take 100 mg by mouth daily.     Yes Historical Provider, MD  amLODipine (NORVASC) 5 MG tablet Take 10 mg by mouth daily. 07/13/11  Yes [name], MD  calcium acetate (PHOSLO) 667 MG capsule Take 667 mg by mouth 4 (four) times daily.    Yes Historical Provider, MD  cloNIDine (CATAPRES - DOSED IN MG/24 HR) 0.1 mg/24hr patch Place 1 patch onto the skin once a week. Changes on friday   Yes Historical Provider, MD  Coenzyme Q10 150 MG CAPS Take 300 mg by mouth daily. Hold while in hospital   Yes Historical Provider, MD  docusate sodium (COLACE) 100 MG  capsule Take 200 mg by mouth at bedtime.     Yes Historical Provider, MD  hydrALAZINE (APRESOLINE) 25 MG tablet Take 50 mg by mouth 3 (three) times daily.  07/18/11  Yes [name], MD  l-methylfolate-B6-B12 (METANX) 3-35-2 MG TABS Take 1 tablet by mouth 2 (two) times daily.    Yes Historical Provider, MD  losartan (COZAAR) 50 MG tablet Take 100 mg by mouth at bedtime.    Yes Historical Provider, MD  metoprolol succinate (TOPROL-XL) 50 MG 24 hr tablet Take 50 mg by mouth daily. Does not take on dialysis days Ulanda Edison, and Sat 07/18/11  Yes [name], MD  multivitamin (RENA-VIT) TABS tablet Take 1 tablet by mouth daily.     Yes Historical Provider, MD  repaglinide (PRANDIN) 0.5 MG tablet Take 0.5 mg by mouth 3 (three) times daily before meals.     Yes Historical Provider, MD  cloNIDine (CATAPRES) 0.2 MG tablet Take 0.2 mg by mouth at bedtime.    Historical Provider, MD    Allergies:   Allergies  Allergen Reactions  . Codeine Other (See Comments)    Passes out  . Epinephrine Other (See Comments)    Tachycardia, diaphoresis, syncope  . Percocet (Oxycodone-Acetaminophen) Other (See Comments)    Passes  out    Social History:  reports that she has been smoking Cigarettes.  She has a 50 pack-year smoking history. She has never used smokeless tobacco. She reports that she does not drink alcohol or  use illicit drugs.  Family History: Family History  Problem Relation Age of Onset  . COPD Mother   . Kidney disease Mother   . Heart disease Father   . Lymphoma Son     Physical Exam: Filed Vitals:   09/24/11 2312  BP: 218/58  Pulse: 80  Temp: 97.7 F (36.5 C)  TempSrc: Oral  Resp: 20  SpO2: 96%   General appearance: alert, cooperative and no distress Head: Normocephalic, without obvious abnormality, atraumatic Eyes: conjunctivae/corneas clear. PERRL, EOM's intact. Fundi benign. Resp: clear to auscultation bilaterally Cardio: regular rate and rhythm, S1, S2 normal, no  murmur, click, rub or gallop GI: soft, non-tender; bowel sounds normal; no masses,  no organomegaly Extremities: edema 1+, Homans sign is negative, no sign of DVT and no edema, redness or tenderness in the calves or thighs   Labs on Admission:   [facility] 09/23/11 0023  NA 138  K 4.0  CL 96  CO2 27  GLUCOSE 180*  BUN 54*  CREATININE 7.06*  CALCIUM 10.0  MG --  PHOS --    Basename 09/23/11 0023  AST 35  ALT 24  ALKPHOS 129*  BILITOT 0.2*  PROT 7.5  ALBUMIN 3.6   No results found for this basename: LIPASE:2,AMYLASE:2 in the last 72 hours  Basename 09/23/11 0023  WBC 7.1  NEUTROABS 3.6  HGB 9.9*  HCT 31.2*  MCV 73.2*  PLT 209   No results found for this basename: CKTOTAL:3,CKMB:3,CKMBINDEX:3,TROPONINI:3 in the last 72 hours No results found for this basename: TSH,T4TOTAL,FREET3,T3FREE,THYROIDAB in the last 72 hours No results found for this basename: VITAMINB12:2,FOLATE:2,FERRITIN:2,TIBC:2,IRON:2,RETICCTPCT:2 in the last 72 hours  Radiological Exams on Admission: No results found.  Assessment/Plan #1 endstage renal disease: Admit to dialysis. Dialysis and labs per renal.  #2 hypertension Continue antihypertensives. Hydralazine when necessary systolic blood pressure greater than 160  #3 diabetes mellitus Routine CBG.  Alexandra [name] JEHIEL 09/25/2011, 2:03 AM

## 2011-09-25 NOTE — ED Provider Notes (Signed)
History     CSN: [id number]  Arrival date & time 09/24/11  2308   First MD Initiated Contact with Patient 09/25/11 0100      Chief Complaint  Patient presents with  . Vascular Access Problem    (Consider location/radiation/quality/duration/timing/severity/associated sxs/prior treatment) HPI Comments: 66 y/o female with hx of ESRD on dialysis - presents for dialysis.  No c/o - no f/c/n/v.  Started catapress patch today with her PMD  The history is provided by the patient and medical records.    Past Medical History  Diagnosis Date  . Chronic kidney disease     esrd  . Diabetes mellitus   . Hyperlipidemia   . Hypertension   . Renal disorder   . Dialysis care     tues, thurs, sat  . 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 Carotid endarterectomy 2002    left  . Btl   . Rectal fissurectomy   . Av fistula placement 10/30/2010    right brachiocephalic   . Fistulogram     Family History  Problem Relation Age of Onset  . COPD Mother   . Kidney disease Mother   . Heart disease Father   . Lymphoma Son     History  Substance Use Topics  . Smoking status: Current Everyday Smoker -- 1.0 packs/day for 50 years    Types: Cigarettes  . Smokeless tobacco: Never Used  . Alcohol Use: No    OB History    Grav Para Term Preterm Abortions TAB SAB Ect Mult Living                  Review of Systems  All other systems reviewed and are negative.    Allergies  Codeine; Epinephrine; and Percocet  Home Medications   Current Outpatient Rx  Name Route Sig Dispense Refill  . ALLOPURINOL 100 MG PO TABS Oral Take 100 mg by mouth daily.      Marland Kitchen AMLODIPINE BESYLATE 5 MG PO TABS Oral Take 10 mg by mouth daily.    Marland Kitchen CALCIUM ACETATE 667 MG PO CAPS Oral Take 667 mg by mouth 4 (four) times daily.     Marland Kitchen CLONIDINE HCL 0.1 MG/24HR TD PTWK Transdermal Place 1 patch onto the skin once a week. Changes on friday    . COENZYME Q10 150 MG PO CAPS Oral  Take 300 mg by mouth daily. Hold while in hospital    . DOCUSATE SODIUM 100 MG PO CAPS Oral Take 200 mg by mouth at bedtime.      Marland Kitchen HYDRALAZINE HCL 25 MG PO TABS Oral Take 50 mg by mouth 3 (three) times daily.     . L-METHYLFOLATE-B6-B12 3-35-2 MG PO TABS Oral Take 1 tablet by mouth 2 (two) times daily.     Marland Kitchen LOSARTAN POTASSIUM 50 MG PO TABS Oral Take 100 mg by mouth at bedtime.     Marland Kitchen METOPROLOL SUCCINATE ER 50 MG PO TB24 Oral Take 50 mg by mouth daily. Does not take on dialysis days Tues, Thurs, and Sat    . RENA-VITE PO TABS Oral Take 1 tablet by mouth daily.      Marland Kitchen REPAGLINIDE 0.5 MG PO TABS Oral Take 0.5 mg by mouth 3 (three) times daily before meals.      Marland Kitchen CLONIDINE HCL 0.2 MG PO TABS Oral Take 0.2 mg by mouth at bedtime.      BP 218/58  Pulse 80  Temp 97.7 F (36.5 C) (Oral)  Resp 20  SpO2 96%  Physical Exam  Nursing note and vitals reviewed. Constitutional: She appears well-developed and well-nourished. No distress.  HENT:  Head: Normocephalic and atraumatic.  Mouth/Throat: Oropharynx is clear and moist. No oropharyngeal exudate.  Eyes: Conjunctivae and EOM are normal. Pupils are equal, round, and reactive to light. Right eye exhibits no discharge. Left eye exhibits no discharge. No scleral icterus.  Neck: Normal range of motion. Neck supple. No JVD present. No thyromegaly present.  Cardiovascular: Normal rate, regular rhythm, normal heart sounds and intact distal pulses.  Exam reveals no gallop and no friction rub.   No murmur heard. Pulmonary/Chest: Effort normal and breath sounds normal. No respiratory distress. She has no wheezes. She has no rales.  Abdominal: Soft. Bowel sounds are normal. She exhibits no distension and no mass. There is no tenderness.  Musculoskeletal: Normal range of motion. She exhibits no edema and no tenderness.  Lymphadenopathy:    She has no cervical adenopathy.  Neurological: She is alert. Coordination normal.  Skin: Skin is warm and dry. No  rash noted. No erythema.  Psychiatric: She has a normal mood and affect. Her behavior is normal.    ED Course  Procedures (including critical care time)  Labs Reviewed - No data to display No results found.   1. ESRD (end stage renal disease)       MDM  Vital signs with elevated blood pressure, this is persistent for this lady, she has no complaints symptomatically and has no physical exam findings of concern. She has normal gait, balance, speech and no limb ataxia. Pursue admission to the hospital for dialysis which usually fixes her blood pressure.   Discussed with the [facility] hospitalist who will admit the patient for further treatment and dialysis.     Vida Roller, MD 09/25/11 [phone]

## 2011-09-25 NOTE — ED Notes (Signed)
Charge RN spoke with Dr Hyacinth Meeker- he will see pt to clear for dialysis, and okay'ed pt to move to POD C for holding; notified dialysis that pt has arrived to ED

## 2011-09-25 NOTE — ED Notes (Signed)
Pt stated that she is here for her dialysis. Pt is not in any respiratory or cardiac distress. She is fully alert and oriented. No pain at this time. Will continue to monitor.

## 2011-09-25 NOTE — ED Notes (Signed)
Sheralyn Boatman RN in Hemodialysis stated that they are ready for the patient. No questions or concerns by the nurse at this time. Transported by Onalee Hua EMT.

## 2011-09-27 ENCOUNTER — Emergency Department (HOSPITAL_COMMUNITY): Payer: Medicare HMO

## 2011-09-27 ENCOUNTER — Emergency Department (HOSPITAL_COMMUNITY)
Admission: EM | Admit: 2011-09-27 | Discharge: 2011-09-28 | Disposition: A | Discharge status: Home / Self Care | Payer: Medicare HMO | Attending: Emergency Medicine | Admitting: Emergency Medicine

## 2011-09-27 ENCOUNTER — Encounter (HOSPITAL_COMMUNITY): Payer: Self-pay | Admitting: *Deleted

## 2011-09-27 DIAGNOSIS — Z79899 Other long term (current) drug therapy: Secondary | ICD-10-CM | POA: Insufficient documentation

## 2011-09-27 DIAGNOSIS — I120 Hypertensive chronic kidney disease with stage 5 chronic kidney disease or end stage renal disease: Secondary | ICD-10-CM | POA: Insufficient documentation

## 2011-09-27 DIAGNOSIS — Z992 Dependence on renal dialysis: Secondary | ICD-10-CM | POA: Insufficient documentation

## 2011-09-27 DIAGNOSIS — I160 Hypertensive urgency: Secondary | ICD-10-CM

## 2011-09-27 DIAGNOSIS — F17200 Nicotine dependence, unspecified, uncomplicated: Secondary | ICD-10-CM | POA: Insufficient documentation

## 2011-09-27 DIAGNOSIS — E119 Type 2 diabetes mellitus without complications: Secondary | ICD-10-CM | POA: Insufficient documentation

## 2011-09-27 DIAGNOSIS — I10 Essential (primary) hypertension: Secondary | ICD-10-CM | POA: Diagnosis present

## 2011-09-27 DIAGNOSIS — E785 Hyperlipidemia, unspecified: Secondary | ICD-10-CM | POA: Insufficient documentation

## 2011-09-27 DIAGNOSIS — M109 Gout, unspecified: Secondary | ICD-10-CM | POA: Insufficient documentation

## 2011-09-27 DIAGNOSIS — N186 End stage renal disease: Secondary | ICD-10-CM | POA: Insufficient documentation

## 2011-09-27 LAB — CBC WITH DIFFERENTIAL/PLATELET
Basophils Relative: 1 % (ref 0–1)
Eosinophils Absolute: 0.2 10*3/uL (ref 0.0–0.7)
Eosinophils Relative: 3 % (ref 0–5)
MCH: 22.9 pg — ABNORMAL LOW (ref 26.0–34.0)
MCHC: 31.8 g/dL (ref 30.0–36.0)
Monocytes Relative: 11 % (ref 3–12)
Neutrophils Relative %: 40 % — ABNORMAL LOW (ref 43–77)
Platelets: 165 10*3/uL (ref 150–400)

## 2011-09-27 MED ORDER — PARICALCITOL 5 MCG/ML IV SOLN
1.0000 ug | Freq: Once | INTRAVENOUS | Status: AC
  Administered 2011-09-27: 1 ug via INTRAVENOUS
  Filled 2011-09-27: qty 0.2

## 2011-09-27 MED ORDER — HEPARIN SODIUM (PORCINE) 1000 UNIT/ML DIALYSIS
20.0000 [IU]/kg | INTRAMUSCULAR | Status: DC | PRN
  Administered 2011-09-27: 1100 [IU] via INTRAVENOUS_CENTRAL
  Filled 2011-09-27: qty 2

## 2011-09-27 NOTE — Progress Notes (Signed)
Retro ur review 

## 2011-09-27 NOTE — ED Notes (Addendum)
Patient is here for her dialysis

## 2011-09-27 NOTE — Consult Note (Signed)
Pt. In ER dialysis orders written . 

## 2011-09-27 NOTE — ED Provider Notes (Signed)
History     CSN: [id number]  Arrival date & time 09/27/11  2032   First MD Initiated Contact with Patient 09/27/11 2133      Chief Complaint  Patient presents with  . Vascular Access Problem    (Consider location/radiation/quality/duration/timing/severity/associated sxs/prior treatment) HPI Comments: Patient with hx of ESRD HD, t/tr/sat comes in with cc of missed dialysis. Pt has no chest pain, sob, n/v/f/c/palpitations/cramping. Pt's last session of dialysis was on Saturday.  The history is provided by the patient and medical records.    Past Medical History  Diagnosis Date  . Chronic kidney disease     esrd  . Diabetes mellitus   . Hyperlipidemia   . Hypertension   . Renal disorder   . Dialysis care     tues, thurs, sat  . 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 Carotid endarterectomy 2002    left  . Btl   . Rectal fissurectomy   . Av fistula placement 10/30/2010    right brachiocephalic   . Fistulogram     Family History  Problem Relation Age of Onset  . COPD Mother   . Kidney disease Mother   . Heart disease Father   . Lymphoma Son     History  Substance Use Topics  . Smoking status: Current Everyday Smoker -- 1.0 packs/day for 50 years    Types: Cigarettes  . Smokeless tobacco: Never Used  . Alcohol Use: No    OB History    Grav Para Term Preterm Abortions TAB SAB Ect Mult Living                  Review of Systems  Constitutional: Negative for activity change.  Respiratory: Negative for cough, chest tightness and shortness of breath.   Cardiovascular: Negative for chest pain, palpitations and leg swelling.  Gastrointestinal: Negative for abdominal pain and abdominal distention.  Neurological: Negative for dizziness, light-headedness and headaches.    Allergies  Codeine; Epinephrine; and Percocet  Home Medications   Current Outpatient Rx  Name Route Sig Dispense Refill  . ALLOPURINOL 100 MG PO TABS  Oral Take 100 mg by mouth daily.      Marland Kitchen AMLODIPINE BESYLATE 5 MG PO TABS Oral Take 10 mg by mouth daily.    Marland Kitchen CALCIUM ACETATE 667 MG PO CAPS Oral Take 667 mg by mouth 4 (four) times daily.     Marland Kitchen CLONIDINE HCL 0.1 MG/24HR TD PTWK Transdermal Place 1 patch onto the skin once a week. Changes on friday    . COENZYME Q10 150 MG PO CAPS Oral Take 300 mg by mouth daily. Hold while in hospital    . DOCUSATE SODIUM 100 MG PO CAPS Oral Take 200 mg by mouth at bedtime.      Marland Kitchen HYDRALAZINE HCL 25 MG PO TABS Oral Take 50 mg by mouth 3 (three) times daily.     . L-METHYLFOLATE-B6-B12 3-35-2 MG PO TABS Oral Take 1 tablet by mouth 2 (two) times daily.     Marland Kitchen LOSARTAN POTASSIUM 50 MG PO TABS Oral Take 100 mg by mouth at bedtime.     Marland Kitchen METOPROLOL SUCCINATE ER 50 MG PO TB24 Oral Take 50 mg by mouth daily. Does not take on dialysis days Tues, Thurs, and Sat    . METOPROLOL SUCCINATE ER 50 MG PO TB24 Oral Take 100 mg by mouth 2 (two) times daily. Takes after dialysis (T, Th, Sat)    .  RENA-VITE PO TABS Oral Take 1 tablet by mouth daily.      Marland Kitchen REPAGLINIDE 0.5 MG PO TABS Oral Take 0.5 mg by mouth 3 (three) times daily before meals.        BP 236/80  Pulse 84  Temp 97.8 F (36.6 C) (Oral)  Resp 16  Ht 5' 8.11" (1.73 m)  Wt 125 lb 7.1 oz (56.9 kg)  BMI 19.01 kg/m2  SpO2 98%  Physical Exam  Constitutional: She is oriented to person, place, and time. She appears well-developed and well-nourished.  HENT:  Head: Normocephalic and atraumatic.  Eyes: EOM are normal.  Neck: Normal range of motion. Neck supple.  Cardiovascular: Normal rate.   Murmur heard. Pulmonary/Chest: Effort normal and breath sounds normal. No respiratory distress.  Abdominal: Soft. Bowel sounds are normal. She exhibits no distension. There is no tenderness. There is no rebound and no guarding.  Neurological: She is alert and oriented to person, place, and time. No cranial nerve deficit.    ED Course  Procedures (including critical care  time)  Labs Reviewed - No data to display No results found.   No diagnosis found.    MDM  Pt with ESRD on HC comes in with cc of missed dialysis. Pt';s last session was on Saturday. Based on hx and exam, no fluid overload evidence. Pt noted to be hypertensive. States that she did take her night meds, but we will likely have to give some oral meds. Will call admitting service to coordinate dialysis.        Derwood Kaplan, MD 09/28/11 [phone]

## 2011-09-27 NOTE — ED Notes (Signed)
Dr Neale Burly met pt in waiting room before triage.

## 2011-09-28 DIAGNOSIS — Z992 Dependence on renal dialysis: Secondary | ICD-10-CM

## 2011-09-28 DIAGNOSIS — N186 End stage renal disease: Secondary | ICD-10-CM

## 2011-09-28 LAB — RENAL FUNCTION PANEL
Albumin: 3.4 g/dL — ABNORMAL LOW (ref 3.5–5.2)
BUN: 70 mg/dL — ABNORMAL HIGH (ref 6–23)
Calcium: 9.8 mg/dL (ref 8.4–10.5)
Phosphorus: 3.1 mg/dL (ref 2.3–4.6)
Potassium: 3.8 mEq/L (ref 3.5–5.1)

## 2011-09-28 MED ORDER — PARICALCITOL 5 MCG/ML IV SOLN
INTRAVENOUS | Status: AC
  Filled 2011-09-28: qty 1

## 2011-09-28 MED ORDER — HYDRALAZINE HCL 20 MG/ML IJ SOLN
10.0000 mg | INTRAMUSCULAR | Status: DC | PRN
  Administered 2011-09-28: 10 mg via INTRAVENOUS
  Filled 2011-09-28: qty 1

## 2011-09-28 NOTE — Discharge Summary (Signed)
  Patient here for her Hemodialysis. Hemodialysis completed within hours of arrival. She wants to go home. Patient is discharged to follow up with Dr frazier and her primary care physician-Dr ross as needed.

## 2011-09-28 NOTE — H&P (Signed)
Alexandra [name] is an 66 y.o. female.   Chief Complaint: Needs HD RUE:[id number] here for hemodialysis. Has had elevated blood pressure all day. No headaches, no NV. No Diarrhea. No other complaints. Her HD orders have been written already. Had HD only yesterday.  Past Medical History  Diagnosis Date  . Chronic kidney disease     esrd  . Diabetes mellitus   . Hyperlipidemia   . Hypertension   . Renal disorder   . Dialysis care     tues, thurs, sat  . 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 Carotid endarterectomy 2002    left  . Btl   . Rectal fissurectomy   . Av fistula placement 10/30/2010    right brachiocephalic   . Fistulogram     Family History  Problem Relation Age of Onset  . COPD Mother   . Kidney disease Mother   . Heart disease Father   . Lymphoma Son    Social History:  reports that she has been smoking Cigarettes.  She has a 50 pack-year smoking history. She has never used smokeless tobacco. She reports that she does not drink alcohol or use illicit drugs.  Allergies:  Allergies  Allergen Reactions  . Codeine Other (See Comments)    Passes out  . Epinephrine Other (See Comments)    Tachycardia, diaphoresis, syncope  . Percocet (Oxycodone-Acetaminophen) Other (See Comments)    Passes  out     (Not in a hospital admission)  Results for orders placed during the hospital encounter of 09/27/11 (from the past 48 hour(s))  CBC WITH DIFFERENTIAL     Status: Abnormal   Collection Time   09/27/11 11:34 PM      Component Value Range Comment   WBC 7.5  4.0 - 10.5 K/uL    RBC 4.15  3.87 - 5.11 MIL/uL    Hemoglobin 9.5 (*) 12.0 - 15.0 g/dL    HCT 19.1 (*) 47.8 - 46.0 %    MCV 72.0 (*) 78.0 - 100.0 fL    MCH 22.9 (*) 26.0 - 34.0 pg    MCHC 31.8  30.0 - 36.0 g/dL    RDW 29.5 (*) 62.1 - 15.5 %    Platelets 165  150 - 400 K/uL    Neutrophils Relative 40 (*) 43 - 77 %    Neutro Abs 3.0  1.7 - 7.7 K/uL    Lymphocytes  Relative 45  12 - 46 %    Lymphs Abs 3.4  0.7 - 4.0 K/uL    Monocytes Relative 11  3 - 12 %    Monocytes Absolute 0.9  0.1 - 1.0 K/uL    Eosinophils Relative 3  0 - 5 %    Eosinophils Absolute 0.2  0.0 - 0.7 K/uL    Basophils Relative 1  0 - 1 %    Basophils Absolute 0.0  0.0 - 0.1 K/uL   RENAL FUNCTION PANEL     Status: Abnormal   Collection Time   09/27/11 11:34 PM      Component Value Range Comment   Sodium 137  135 - 145 mEq/L    Potassium 3.8  3.5 - 5.1 mEq/L    Chloride 95 (*) 96 - 112 mEq/L    CO2 28  19 - 32 mEq/L    Glucose, Bld 109 (*) 70 - 99 mg/dL    BUN 70 (*) 6 - 23 mg/dL  Creatinine, Ser 8.37 (*) 0.50 - 1.10 mg/dL    Calcium 9.8  8.4 - 16.1 mg/dL    Phosphorus 3.1  2.3 - 4.6 mg/dL    Albumin 3.4 (*) 3.5 - 5.2 g/dL    GFR calc non Af Amer 4 (*) >90 mL/min    GFR calc Af Amer 5 (*) >90 mL/min    No results found.  Review of Systems  All other systems reviewed and are negative.    Blood pressure 134/49, pulse 83, temperature 97.1 F (36.2 C), temperature source Oral, resp. rate 20, height 5' 8.11" (1.73 m), weight 57.9 kg (127 lb 10.3 oz), SpO2 92.00%. Physical Exam  Constitutional: She is oriented to person, place, and time. She appears well-developed and well-nourished.  HENT:  Head: Normocephalic and atraumatic.  Right Ear: External ear normal.  Left Ear: External ear normal.  Nose: Nose normal.  Mouth/Throat: Oropharynx is clear and moist.  Eyes: Conjunctivae and EOM are normal. Pupils are equal, round, and reactive to light.  Neck: Normal range of motion. Neck supple.  Cardiovascular: Normal rate, regular rhythm, normal heart sounds and intact distal pulses.   Respiratory: Effort normal and breath sounds normal.  GI: Soft. Bowel sounds are normal.  Musculoskeletal: Normal range of motion.  Neurological: She is alert and oriented to person, place, and time. She has normal reflexes.  Skin: Skin is warm and dry.  Psychiatric: She has a normal mood and  affect. Her behavior is normal. Judgment and thought content normal.     Assessment/Plan 66 yo woman here for her HD.  1. ESRD: HD per Dr Bascom Levels 2. HTN: Home medications and HD will likely correct it. 3. DM2: Controlled 4. Hyperlipidemia: Continue statin  GARBA,LAWAL 09/28/2011, 3:52 AM

## 2011-09-29 ENCOUNTER — Observation Stay (HOSPITAL_COMMUNITY)
Admission: EM | Admit: 2011-09-29 | Discharge: 2011-09-30 | Disposition: A | Discharge status: Home / Self Care | Payer: Medicare HMO | Attending: Emergency Medicine | Admitting: Emergency Medicine

## 2011-09-29 ENCOUNTER — Encounter (HOSPITAL_COMMUNITY): Payer: Self-pay | Admitting: *Deleted

## 2011-09-29 DIAGNOSIS — I10 Essential (primary) hypertension: Secondary | ICD-10-CM

## 2011-09-29 DIAGNOSIS — E119 Type 2 diabetes mellitus without complications: Secondary | ICD-10-CM | POA: Insufficient documentation

## 2011-09-29 DIAGNOSIS — Z992 Dependence on renal dialysis: Principal | ICD-10-CM | POA: Insufficient documentation

## 2011-09-29 DIAGNOSIS — I120 Hypertensive chronic kidney disease with stage 5 chronic kidney disease or end stage renal disease: Secondary | ICD-10-CM | POA: Insufficient documentation

## 2011-09-29 DIAGNOSIS — N186 End stage renal disease: Secondary | ICD-10-CM | POA: Insufficient documentation

## 2011-09-29 MED ORDER — HEPARIN SODIUM (PORCINE) 1000 UNIT/ML DIALYSIS
20.0000 [IU]/kg | INTRAMUSCULAR | Status: DC | PRN
  Filled 2011-09-29: qty 2

## 2011-09-29 NOTE — ED Provider Notes (Signed)
History     CSN: [id number]  Arrival date & time 09/29/11  2202   First MD Initiated Contact with Patient 09/29/11 2339      Chief Complaint  Patient presents with  . Vascular Access Problem    (Consider location/radiation/quality/duration/timing/severity/associated sxs/prior treatment) HPI History provided by patient. History of end-stage renal disease presenting her for dialysis. Last dialysis 2 days ago. Chest pain. No shortness of breath. Moderate severity. No pain or radiation. No fevers or chills or recent illness. No bleeding problems. Here for scheduled dialysis. Past Medical History  Diagnosis Date  . Chronic kidney disease     esrd  . Diabetes mellitus   . Hyperlipidemia   . Hypertension   . Renal disorder   . Dialysis care     tues, thurs, sat  . 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 Carotid endarterectomy 2002    left  . Btl   . Rectal fissurectomy   . Av fistula placement 10/30/2010    right brachiocephalic   . Fistulogram     Family History  Problem Relation Age of Onset  . COPD Mother   . Kidney disease Mother   . Heart disease Father   . Lymphoma Son     History  Substance Use Topics  . Smoking status: Current Everyday Smoker -- 1.0 packs/day for 50 years    Types: Cigarettes  . Smokeless tobacco: Never Used  . Alcohol Use: No    OB History    Grav Para Term Preterm Abortions TAB SAB Ect Mult Living                  Review of Systems  Constitutional: Negative for fever and chills.  HENT: Negative for neck pain and neck stiffness.   Eyes: Negative for pain.  Respiratory: Negative for shortness of breath.   Cardiovascular: Negative for chest pain.  Gastrointestinal: Negative for abdominal pain.  Genitourinary: Negative for dysuria.  Musculoskeletal: Negative for back pain.  Skin: Negative for rash.  Neurological: Negative for headaches.  All other systems reviewed and are  negative.    Allergies  Codeine; Epinephrine; and Percocet  Home Medications   Current Outpatient Rx  Name Route Sig Dispense Refill  . ALLOPURINOL 100 MG PO TABS Oral Take 100 mg by mouth daily.      Marland Kitchen AMLODIPINE BESYLATE 5 MG PO TABS Oral Take 10 mg by mouth daily.    Marland Kitchen CALCIUM ACETATE 667 MG PO CAPS Oral Take 667 mg by mouth 4 (four) times daily.     Marland Kitchen CLONIDINE HCL 0.1 MG/24HR TD PTWK Transdermal Place 1 patch onto the skin once a week. Changes on friday    . COENZYME Q10 150 MG PO CAPS Oral Take 300 mg by mouth daily. Hold while in hospital    . DOCUSATE SODIUM 100 MG PO CAPS Oral Take 200 mg by mouth at bedtime.      Marland Kitchen HYDRALAZINE HCL 25 MG PO TABS Oral Take 50 mg by mouth 3 (three) times daily.     . L-METHYLFOLATE-B6-B12 3-35-2 MG PO TABS Oral Take 1 tablet by mouth 2 (two) times daily.     Marland Kitchen LOSARTAN POTASSIUM 50 MG PO TABS Oral Take 100 mg by mouth at bedtime.     Marland Kitchen METOPROLOL SUCCINATE ER 50 MG PO TB24 Oral Take 100 mg by mouth 2 (two) times daily. Takes after dialysis (T, Th, Sat)    .  RENA-VITE PO TABS Oral Take 1 tablet by mouth daily.      Marland Kitchen REPAGLINIDE 0.5 MG PO TABS Oral Take 0.5 mg by mouth 3 (three) times daily before meals.        BP 199/69  Temp 97.9 F (36.6 C) (Oral)  Resp 16  Wt 125 lb (56.7 kg)  SpO2 98%  Physical Exam  Constitutional: She is oriented to person, place, and time. She appears well-developed and well-nourished.  HENT:  Head: Normocephalic and atraumatic.  Eyes: Pupils are equal, round, and reactive to light.  Neck: Trachea normal. Neck supple.  Cardiovascular: S1 normal and S2 normal.   Pulses:      Radial pulses are 2+ on the right side, and 2+ on the left side.       No clubbing cyanosis or edema  Pulmonary/Chest: Effort normal. No stridor. She has no wheezes. She has no rhonchi.  Musculoskeletal: Normal range of motion.  Neurological: She is alert and oriented to person, place, and time. She has normal strength. No cranial nerve  deficit or sensory deficit. GCS eye subscore is 4. GCS verbal subscore is 5. GCS motor subscore is 6.  Skin: Skin is warm and dry. No rash noted. She is not diaphoretic.  Psychiatric: Her speech is normal.       Cooperative and appropriate    ED Course  Procedures (including critical care time)  Per protocol, no labs or ECG on this PT.  11:51 PM d/w DR Jaci Standard, plan admit for dialysis   MDM   Here for dialysis.  Old records reviewed. Nursing notes reviewed. Vital signs reviewed. Medicine consult for admission as above.       Sunnie Nielsen, MD 10/01/11 [phone]

## 2011-09-29 NOTE — ED Notes (Signed)
Patient is here for her scheduled dialysis.

## 2011-09-29 NOTE — Progress Notes (Signed)
Call from ER nurse informing Hemo RN of patient being in the ER, asked if Hemo is ready for this patient. Hemo RN informed ER RN of the policy is the Hospitalist has to admit this patient prior to transport to the Hemodialysis unit.

## 2011-09-29 NOTE — Consult Note (Signed)
Pt. In ER . Dialysis orders written.

## 2011-09-29 NOTE — ED Notes (Signed)
Pt given recliner, warm blanket, and hot water per request.

## 2011-09-30 ENCOUNTER — Observation Stay (HOSPITAL_COMMUNITY): Payer: Medicare HMO

## 2011-09-30 ENCOUNTER — Encounter (HOSPITAL_COMMUNITY): Payer: Self-pay | Admitting: Internal Medicine

## 2011-09-30 DIAGNOSIS — N186 End stage renal disease: Secondary | ICD-10-CM

## 2011-09-30 DIAGNOSIS — I10 Essential (primary) hypertension: Secondary | ICD-10-CM

## 2011-09-30 DIAGNOSIS — E119 Type 2 diabetes mellitus without complications: Secondary | ICD-10-CM

## 2011-09-30 LAB — CBC WITH DIFFERENTIAL/PLATELET
Basophils Relative: 1 % (ref 0–1)
HCT: 30.5 % — ABNORMAL LOW (ref 36.0–46.0)
Hemoglobin: 9.6 g/dL — ABNORMAL LOW (ref 12.0–15.0)
Lymphs Abs: 2.9 10*3/uL (ref 0.7–4.0)
MCH: 22.7 pg — ABNORMAL LOW (ref 26.0–34.0)
MCHC: 31.5 g/dL (ref 30.0–36.0)
Monocytes Absolute: 1 10*3/uL (ref 0.1–1.0)
Monocytes Relative: 13 % — ABNORMAL HIGH (ref 3–12)
Neutro Abs: 3.5 10*3/uL (ref 1.7–7.7)
Neutrophils Relative %: 46 % (ref 43–77)
RBC: 4.22 MIL/uL (ref 3.87–5.11)

## 2011-09-30 LAB — RENAL FUNCTION PANEL
CO2: 28 mEq/L (ref 19–32)
Calcium: 9.9 mg/dL (ref 8.4–10.5)
Chloride: 97 mEq/L (ref 96–112)
Creatinine, Ser: 6.89 mg/dL — ABNORMAL HIGH (ref 0.50–1.10)
GFR calc Af Amer: 6 mL/min — ABNORMAL LOW (ref >90)
Glucose, Bld: 98 mg/dL (ref 70–99)

## 2011-09-30 MED ORDER — PARICALCITOL 5 MCG/ML IV SOLN
INTRAVENOUS | Status: AC
  Administered 2011-09-30: 1 ug via INTRAVENOUS
  Filled 2011-09-30: qty 1

## 2011-09-30 MED ORDER — HYDRALAZINE HCL 20 MG/ML IJ SOLN: 10.0000 mg | INTRAMUSCULAR | Status: DC | PRN

## 2011-09-30 MED ORDER — DARBEPOETIN ALFA-POLYSORBATE 100 MCG/0.5ML IJ SOLN
100.0000 ug | Freq: Once | INTRAMUSCULAR | Status: AC
  Administered 2011-09-30: 100 ug via INTRAVENOUS

## 2011-09-30 MED ORDER — DARBEPOETIN ALFA-POLYSORBATE 100 MCG/0.5ML IJ SOLN
INTRAMUSCULAR | Status: AC
  Administered 2011-09-30: 100 ug via INTRAVENOUS
  Filled 2011-09-30: qty 0.5

## 2011-09-30 MED ORDER — PARICALCITOL 5 MCG/ML IV SOLN
1.0000 ug | Freq: Once | INTRAVENOUS | Status: AC
  Administered 2011-09-30: 1 ug via INTRAVENOUS

## 2011-09-30 NOTE — Progress Notes (Signed)
Observation review is complete. 

## 2011-09-30 NOTE — Discharge Summary (Signed)
Comment:  Patient was admitted for Hemodialysis in early AM 09/30/11. Hemodialysis completed within hours of arrival and she left for home. Not seen by this MD. Patient presumably, will continue to follow up with Dr Bascom Levels and her primary care physician Dr Tenny Craw as needed.  C. Karris Deangelo. MD FACP.

## 2011-09-30 NOTE — H&P (Signed)
Alexandra [name] is an 66 y.o. female.   Nephrologist - Dr.Frazier. Chief Complaint: Has come for regular dialysis. HPI: 66 year-old female with known history of ESRD on hemodialysis has come for regular dialysis. Patient denies any chest pain or shortness of breath.  Past Medical History  Diagnosis Date  . Chronic kidney disease     esrd  . Diabetes mellitus   . Hyperlipidemia   . Hypertension   . Renal disorder   . Dialysis care     tues, thurs, sat  . 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 Carotid endarterectomy 2002    left  . Btl   . Rectal fissurectomy   . Av fistula placement 10/30/2010    right brachiocephalic   . Fistulogram     Family History  Problem Relation Age of Onset  . COPD Mother   . Kidney disease Mother   . Heart disease Father   . Lymphoma Son    Social History:  reports that she has been smoking Cigarettes.  She has a 50 pack-year smoking history. She has never used smokeless tobacco. She reports that she does not drink alcohol or use illicit drugs.  Allergies:  Allergies  Allergen Reactions  . Codeine Other (See Comments)    Passes out  . Epinephrine Other (See Comments)    Tachycardia, diaphoresis, syncope  . Percocet (Oxycodone-Acetaminophen) Other (See Comments)    Passes  out     (Not in a hospital admission)  No results found for this or any previous visit (from the past 48 hour(s)). No results found.  Review of Systems  Constitutional: Negative.   HENT: Negative.   Eyes: Negative.   Respiratory: Negative.   Cardiovascular: Negative.   Gastrointestinal: Negative.   Genitourinary: Negative.   Musculoskeletal: Negative.   Skin: Negative.   Neurological: Negative.   Endo/Heme/Allergies: Negative.   Psychiatric/Behavioral: Negative.     Blood pressure 202/76, pulse 72, temperature 97 F (36.1 C), temperature source Oral, resp. rate 16, weight 56.7 kg (125 lb), SpO2 98.00%. Physical  Exam  Constitutional: She is oriented to person, place, and time. She appears well-developed and well-nourished. No distress.  HENT:  Head: Normocephalic and atraumatic.  Right Ear: External ear normal.  Left Ear: External ear normal.  Nose: Nose normal.  Mouth/Throat: Oropharynx is clear and moist. No oropharyngeal exudate.  Eyes: Conjunctivae are normal. Pupils are equal, round, and reactive to light. Right eye exhibits no discharge. Left eye exhibits no discharge. No scleral icterus.  Neck: Normal range of motion. Neck supple.  Cardiovascular: Normal rate and regular rhythm.   Respiratory: Effort normal and breath sounds normal. No respiratory distress. She has no wheezes. She has no rales.  GI: Soft. Bowel sounds are normal. She exhibits no distension. There is no tenderness. There is no rebound.  Musculoskeletal: Normal range of motion. She exhibits no edema and no tenderness.  Neurological: She is alert and oriented to person, place, and time.       Moves all extremities.  Skin: Skin is warm and dry. She is not diaphoretic.  Psychiatric: Her behavior is normal.     Assessment/Plan #1. ESRD on hemodialysis - dialysis per nephrologist. #2. Diabetes mellitus 2 - continue home medications. #3. Uncontrolled hypertension - continue home medications. Place patient on when necessary IV hydralazine for systolic blood pressure more than 160. Follow blood pressures after dialysis.  CODE STATUS - full code.  Midge Minium  N. 09/30/2011, 1:34 AM

## 2011-09-30 NOTE — ED Notes (Signed)
Pt transferred to dialysis.

## 2011-10-01 ENCOUNTER — Encounter (HOSPITAL_COMMUNITY): Payer: Self-pay | Admitting: *Deleted

## 2011-10-01 ENCOUNTER — Inpatient Hospital Stay (HOSPITAL_COMMUNITY): Payer: Medicare HMO

## 2011-10-01 ENCOUNTER — Other Ambulatory Visit: Payer: Self-pay | Admitting: Nephrology

## 2011-10-01 DIAGNOSIS — D638 Anemia in other chronic diseases classified elsewhere: Secondary | ICD-10-CM | POA: Diagnosis present

## 2011-10-01 DIAGNOSIS — N186 End stage renal disease: Secondary | ICD-10-CM | POA: Diagnosis present

## 2011-10-01 DIAGNOSIS — I120 Hypertensive chronic kidney disease with stage 5 chronic kidney disease or end stage renal disease: Principal | ICD-10-CM | POA: Diagnosis present

## 2011-10-01 MED ORDER — HEPARIN SODIUM (PORCINE) 1000 UNIT/ML DIALYSIS
20.0000 [IU]/kg | INTRAMUSCULAR | Status: DC | PRN
  Administered 2011-10-02: 1100 [IU] via INTRAVENOUS_CENTRAL
  Filled 2011-10-01: qty 2

## 2011-10-01 NOTE — Progress Notes (Unsigned)
Patient ID: Alexandra [name], female   DOB: [birth year], 66 y.o.   MRN: [id number] Pt. In the ER she is alert and stable.

## 2011-10-01 NOTE — ED Notes (Signed)
cbg 211 

## 2011-10-01 NOTE — Consult Note (Signed)
  Pt. In the ER the patient. In need of dialysis see orders.

## 2011-10-01 NOTE — ED Notes (Signed)
Pt here for dialysis routine

## 2011-10-02 ENCOUNTER — Inpatient Hospital Stay (HOSPITAL_COMMUNITY): Payer: Medicare HMO

## 2011-10-02 ENCOUNTER — Inpatient Hospital Stay (HOSPITAL_COMMUNITY)
Admission: EM | Admit: 2011-10-02 | Discharge: 2011-10-02 | DRG: 682 | Disposition: A | Discharge status: Home / Self Care | Payer: Medicare HMO | Attending: Internal Medicine | Admitting: Internal Medicine

## 2011-10-02 ENCOUNTER — Encounter (HOSPITAL_COMMUNITY): Payer: Self-pay | Admitting: Internal Medicine

## 2011-10-02 DIAGNOSIS — D638 Anemia in other chronic diseases classified elsewhere: Secondary | ICD-10-CM | POA: Diagnosis present

## 2011-10-02 DIAGNOSIS — E119 Type 2 diabetes mellitus without complications: Secondary | ICD-10-CM

## 2011-10-02 DIAGNOSIS — I10 Essential (primary) hypertension: Secondary | ICD-10-CM

## 2011-10-02 DIAGNOSIS — E1122 Type 2 diabetes mellitus with diabetic chronic kidney disease: Secondary | ICD-10-CM | POA: Diagnosis present

## 2011-10-02 DIAGNOSIS — N186 End stage renal disease: Secondary | ICD-10-CM | POA: Diagnosis present

## 2011-10-02 LAB — RENAL FUNCTION PANEL
Calcium: 9.9 mg/dL (ref 8.4–10.5)
Creatinine, Ser: 7.34 mg/dL — ABNORMAL HIGH (ref 0.50–1.10)
GFR calc Af Amer: 6 mL/min — ABNORMAL LOW (ref >90)
GFR calc non Af Amer: 5 mL/min — ABNORMAL LOW (ref >90)
Glucose, Bld: 121 mg/dL — ABNORMAL HIGH (ref 70–99)
Phosphorus: 2.3 mg/dL (ref 2.3–4.6)
Sodium: 139 mEq/L (ref 135–145)

## 2011-10-02 LAB — CBC WITH DIFFERENTIAL/PLATELET
Basophils Absolute: 0 10*3/uL (ref 0.0–0.1)
Basophils Relative: 0 % (ref 0–1)
Eosinophils Absolute: 0.3 10*3/uL (ref 0.0–0.7)
Eosinophils Relative: 3 % (ref 0–5)
HCT: 31.3 % — ABNORMAL LOW (ref 36.0–46.0)
Hemoglobin: 9.8 g/dL — ABNORMAL LOW (ref 12.0–15.0)
MCV: 71.8 fL — ABNORMAL LOW (ref 78.0–100.0)
Monocytes Absolute: 1 10*3/uL (ref 0.1–1.0)
Monocytes Relative: 11 % (ref 3–12)
Platelets: 156 10*3/uL (ref 150–400)
RBC: 4.36 MIL/uL (ref 3.87–5.11)
WBC: 9.1 10*3/uL (ref 4.0–10.5)

## 2011-10-02 MED ORDER — PARICALCITOL 5 MCG/ML IV SOLN
INTRAVENOUS | Status: AC
  Administered 2011-10-02: 1 ug
  Filled 2011-10-02: qty 1

## 2011-10-02 MED ORDER — PARICALCITOL 5 MCG/ML IV SOLN
1.0000 ug | Freq: Once | INTRAVENOUS | Status: AC
  Filled 2011-10-02: qty 0.2

## 2011-10-02 NOTE — Progress Notes (Signed)
Hemodialysis orders received at 1050, awaiting admission orders for pt to be dialyzed.

## 2011-10-02 NOTE — ED Provider Notes (Signed)
History     CSN: [id number]  Arrival date & time 10/01/11  2138   None     Chief Complaint  Patient presents with  . Vascular Access Problem    (Consider location/radiation/quality/duration/timing/severity/associated sxs/prior treatment) HPI Comments: Patient here for dialysis.  She has no complaints at this time.  No shortness of breath, chest pain, nausea, vomiting, dizziness.  Dr. Leretha Dykes has been to see this patient and she is awaiting hospital admission for dialysis  The history is provided by the patient.    Past Medical History  Diagnosis Date  . Chronic kidney disease     esrd  . Diabetes mellitus   . Hyperlipidemia   . Hypertension   . Renal disorder   . Dialysis care     tues, thurs, sat  . 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 Carotid endarterectomy 2002    left  . Btl   . Rectal fissurectomy   . Av fistula placement 10/30/2010    right brachiocephalic   . Fistulogram     Family History  Problem Relation Age of Onset  . COPD Mother   . Kidney disease Mother   . Heart disease Father   . Lymphoma Son     History  Substance Use Topics  . Smoking status: Current Everyday Smoker -- 1.0 packs/day for 50 years    Types: Cigarettes  . Smokeless tobacco: Never Used  . Alcohol Use: No    OB History    Grav Para Term Preterm Abortions TAB SAB Ect Mult Living                  Review of Systems  Unable to perform ROS: Psychiatric disorder    Allergies  Codeine; Epinephrine; and Percocet  Home Medications   Current Outpatient Rx  Name Route Sig Dispense Refill  . ALLOPURINOL 100 MG PO TABS Oral Take 100 mg by mouth daily.      Marland Kitchen AMLODIPINE BESYLATE 5 MG PO TABS Oral Take 10 mg by mouth daily.    Marland Kitchen CALCIUM ACETATE 667 MG PO CAPS Oral Take 667 mg by mouth 4 (four) times daily.     Marland Kitchen CLONIDINE HCL 0.1 MG/24HR TD PTWK Transdermal Place 1 patch onto the skin once a week. Changes on friday    . COENZYME Q10 150  MG PO CAPS Oral Take 300 mg by mouth daily. Hold while in hospital    . DOCUSATE SODIUM 100 MG PO CAPS Oral Take 200 mg by mouth at bedtime.      Marland Kitchen HYDRALAZINE HCL 25 MG PO TABS Oral Take 50 mg by mouth 3 (three) times daily.     . L-METHYLFOLATE-B6-B12 3-35-2 MG PO TABS Oral Take 1 tablet by mouth 2 (two) times daily.     Marland Kitchen LOSARTAN POTASSIUM 50 MG PO TABS Oral Take 100 mg by mouth at bedtime.     Marland Kitchen METOPROLOL SUCCINATE ER 50 MG PO TB24 Oral Take 100 mg by mouth 2 (two) times daily. Takes after dialysis (T, Th, Sat)    . RENA-VITE PO TABS Oral Take 1 tablet by mouth daily.      Marland Kitchen REPAGLINIDE 0.5 MG PO TABS Oral Take 0.5 mg by mouth 3 (three) times daily before meals.       BP 205/71  Pulse 73  Temp 97.9 F (36.6 C) (Oral)  Resp 16  Wt 124 lb 12.5 oz (56.6 kg)  SpO2  100%  Physical Exam  Nursing note and vitals reviewed.   ED Course  Procedures (including critical care time)  Labs Reviewed  GLUCOSE, CAPILLARY - Abnormal; Notable for the following:    Glucose-Capillary 211 (*)     All other components within normal limits  GLUCOSE, CAPILLARY - Abnormal; Notable for the following:    Glucose-Capillary 216 (*)     All other components within normal limits  GLUCOSE, CAPILLARY - Abnormal; Notable for the following:    Glucose-Capillary 155 (*)     All other components within normal limits   No results found.   1. End stage renal disease       MDM   Patient has no complaints at this time.  She is just awaiting a dialysis slot.  She refuses to sit in a bed.  She refuses exam.  She refuses lab work.  She is upset, because she's had to wait to be seen        Arman Filter, NP 10/02/11 0407  Arman Filter, NP 10/02/11 0407  Arman Filter, NP 10/02/11 [phone]

## 2011-10-02 NOTE — ED Notes (Signed)
Pt transferred to dialysis at this time.

## 2011-10-02 NOTE — ED Notes (Signed)
Charge RN talking with pt at this time.

## 2011-10-02 NOTE — ED Notes (Signed)
admitting MD at bedside

## 2011-10-02 NOTE — ED Provider Notes (Signed)
Medical screening examination/treatment/procedure(s) were performed by non-physician practitioner and as supervising physician I was immediately available for consultation/collaboration.   Jaicion Laurie, MD 10/02/11 0600 

## 2011-10-02 NOTE — ED Notes (Signed)
Pt complains of no medical issues.

## 2011-10-02 NOTE — H&P (Signed)
PCP:  [name], MD  Renal : Jeri Cos  Chief Complaint:   Needs HD  HPI: Alexandra [name] is a 66 y.o. female   has a past medical history of Chronic kidney disease; Diabetes mellitus; Hyperlipidemia; Hypertension; Renal disorder; Dialysis care; and Gout due to renal impairment.   Presented with  Patient is here for her regularly scheduled HD. Tuesday, Thurday, Saturday. She denies any complaints. States that her blood pressure has been doing better since she started on catapress  Review of Systems:    Pertinent positives include: none  Constitutional:  No weight loss, night sweats, Fevers, chills, fatigue, weight loss  HEENT:  No headaches, Difficulty swallowing,Tooth/dental problems,Sore throat,  No sneezing, itching, ear ache, nasal congestion, post nasal drip,  Cardio-vascular:  No chest pain, Orthopnea, PND, anasarca, dizziness, palpitations.no Bilateral lower extremity swelling  GI:  No heartburn, indigestion, abdominal pain, nausea, vomiting, diarrhea, change in bowel habits, loss of appetite, melena, blood in stool, hematemesis Resp:  no shortness of breath at rest. No dyspnea on exertion, No excess mucus, no productive cough, No non-productive cough, No coughing up of blood.No change in color of mucus.No wheezing. Skin:  no rash or lesions. No jaundice GU:  no dysuria, change in color of urine, no urgency or frequency. No straining to urinate.  No flank pain.  Musculoskeletal:  No joint pain or no joint swelling. No decreased range of motion. No back pain.  Psych:  No change in mood or affect. No depression or anxiety. No memory loss.  Neuro: no localizing neurological complaints, no tingling, no weakness, no double vision, no gait abnormality, no slurred speech, no confusion  Otherwise ROS are negative except for above, 10 systems were reviewed  Past Medical History: Past Medical History  Diagnosis Date  . Chronic kidney disease     esrd    . Diabetes mellitus   . Hyperlipidemia   . Hypertension   . Renal disorder   . Dialysis care     tues, thurs, sat  . Gout due to renal impairment    Past Surgical History  Procedure Date  . Carotid endarterectomy 2002    left  . Btl   . Rectal fissurectomy   . Av fistula placement 10/30/2010    right brachiocephalic   . Fistulogram      Medications: Prior to Admission medications   Medication Sig Start Date End Date Taking? Authorizing Provider  allopurinol (ZYLOPRIM) 100 MG tablet Take 100 mg by mouth daily.     Yes Historical Provider, MD  amLODipine (NORVASC) 5 MG tablet Take 10 mg by mouth daily. 07/13/11  Yes [name], MD  calcium acetate (PHOSLO) 667 MG capsule Take 667 mg by mouth 4 (four) times daily.    Yes Historical Provider, MD  cloNIDine (CATAPRES - DOSED IN MG/24 HR) 0.1 mg/24hr patch Place 1 patch onto the skin once a week. Changes on friday   Yes Historical Provider, MD  Coenzyme Q10 150 MG CAPS Take 300 mg by mouth daily. Hold while in hospital   Yes Historical Provider, MD  docusate sodium (COLACE) 100 MG capsule Take 200 mg by mouth at bedtime.     Yes Historical Provider, MD  hydrALAZINE (APRESOLINE) 25 MG tablet Take 50 mg by mouth 3 (three) times daily.  07/18/11  Yes [name], MD  l-methylfolate-B6-B12 (METANX) 3-35-2 MG TABS Take 1 tablet by mouth 2 (two) times daily.    Yes Historical Provider, MD  losartan (COZAAR)  50 MG tablet Take 100 mg by mouth at bedtime.    Yes Historical Provider, MD  metoprolol succinate (TOPROL-XL) 50 MG 24 hr tablet Take 100 mg by mouth 2 (two) times daily. Takes after dialysis (T, Th, Sat)   Yes Historical Provider, MD  multivitamin (RENA-VIT) TABS tablet Take 1 tablet by mouth daily.     Yes Historical Provider, MD  repaglinide (PRANDIN) 0.5 MG tablet Take 0.5 mg by mouth 3 (three) times daily before meals.    Yes Historical Provider, MD    Allergies:   Allergies  Allergen Reactions  . Codeine Other (See  Comments)    Passes out  . Epinephrine Other (See Comments)    Tachycardia, diaphoresis, syncope  . Percocet (Oxycodone-Acetaminophen) Other (See Comments)    Passes  out    Social History:  Ambulatory  independently  Lives at  home   reports that she has been smoking Cigarettes.  She has a 50 pack-year smoking history. She has never used smokeless tobacco. She reports that she does not drink alcohol or use illicit drugs.   Family History: family history includes COPD in her mother; Heart disease in her father; Kidney disease in her mother; and Lymphoma in her son.    Physical Exam: Patient Vitals for the past 24 hrs:  BP Temp Temp src Pulse Resp SpO2 Weight  10/02/11 0354 205/71 mmHg 97.9 F (36.6 C) Oral 73  16  100 % -  10/01/11 2214 - - - - - - 56.6 kg (124 lb 12.5 oz)  10/01/11 2151 192/63 mmHg 98.7 F (37.1 C) - 80  16  99 % -    1. General:  in No Acute distress 2. Psychological: Alert and Oriented 3. Head/ENT:   Moist Mucous Membranes                          Head Non traumatic, neck supple                          Normal  Dentition 4. SKIN: normal  Skin turgor,  Skin clean Dry and intact no rash 5. Heart: Regular rate and rhythm no Murmur, Rub or gallop 6. Lungs: Clear to auscultation bilaterally, no wheezes or crackles   7. Abdomen: Soft, non-tender, Non distended 8. Lower extremities: no clubbing, cyanosis, mild edema 9. Neurologically Grossly intact, moving all 4 extremities equally 10. MSK: Normal range of motion  body mass index is unknown because there is no height on file.   Labs on Admission:   [facility] 09/30/11 0121  NA 140  K 4.2  CL 97  CO2 28  GLUCOSE 98  BUN 58*  CREATININE 6.89*  CALCIUM 9.9  MG --  PHOS 2.6    Basename 09/30/11 0121  AST --  ALT --  ALKPHOS --  BILITOT --  PROT --  ALBUMIN 3.5   No results found for this basename: LIPASE:2,AMYLASE:2 in the last 72 hours  Basename 09/30/11 0122  WBC 7.7  NEUTROABS 3.5   HGB 9.6*  HCT 30.5*  MCV 72.3*  PLT 158   No results found for this basename: CKTOTAL:3,CKMB:3,CKMBINDEX:3,TROPONINI:3 in the last 72 hours No results found for this basename: TSH,T4TOTAL,FREET3,T3FREE,THYROIDAB in the last 72 hours No results found for this basename: VITAMINB12:2,FOLATE:2,FERRITIN:2,TIBC:2,IRON:2,RETICCTPCT:2 in the last 72 hours Lab Results  Component Value Date   HGBA1C 5.4 08/02/2011    The CrCl is unknown because both  a height and weight (above a minimum accepted value) are required for this calculation. ABG    Component Value Date/Time   TCO2 24 09/07/2011 0812     No results found for this basename: DDIMER      Cultures:    Component Value Date/Time   SDES URINE, CLEAN CATCH 08/20/2009 2114   SPECREQUEST NONE 08/20/2009 2114   CULT NO GROWTH 08/20/2009 2114   REPTSTATUS 08/22/2009 FINAL 08/20/2009 2114       Radiological Exams on Admission: No results found.  Chart has been reviewed  Assessment/Plan  66 yo W hx of ESRD here to get her HD  Present on Admission:  .End stage renal disease - here needing HD, will admit, Dr. Bascom Levels to write HD orders .Anemia of chronic disease  - stable .Diabetes mellitus - ssi  .HTN (hypertension), benign - this usually comes down after HD if needed with give PRN hydralazine   Prophylaxis: SCD   CODE STATUS: FULL CODE  Other plan as per orders.  I have spent a total of 35 min on this admission  Millan Legan 10/02/2011, 4:10 AM

## 2011-10-02 NOTE — ED Notes (Signed)
Wynona Canes, RN called to give report. RN will send her up when pt has admitting orders.

## 2011-10-04 ENCOUNTER — Observation Stay (HOSPITAL_COMMUNITY)
Admission: EM | Admit: 2011-10-04 | Discharge: 2011-10-05 | Disposition: A | Discharge status: Home / Self Care | Payer: Medicare HMO | Attending: Internal Medicine | Admitting: Internal Medicine

## 2011-10-04 ENCOUNTER — Observation Stay (HOSPITAL_COMMUNITY): Payer: Medicare HMO

## 2011-10-04 ENCOUNTER — Encounter: Payer: Self-pay | Admitting: Nephrology

## 2011-10-04 ENCOUNTER — Encounter (HOSPITAL_COMMUNITY): Payer: Self-pay | Admitting: Emergency Medicine

## 2011-10-04 DIAGNOSIS — E119 Type 2 diabetes mellitus without complications: Secondary | ICD-10-CM

## 2011-10-04 DIAGNOSIS — D638 Anemia in other chronic diseases classified elsewhere: Secondary | ICD-10-CM

## 2011-10-04 DIAGNOSIS — D631 Anemia in chronic kidney disease: Secondary | ICD-10-CM | POA: Diagnosis present

## 2011-10-04 DIAGNOSIS — E785 Hyperlipidemia, unspecified: Secondary | ICD-10-CM | POA: Diagnosis present

## 2011-10-04 DIAGNOSIS — N186 End stage renal disease: Secondary | ICD-10-CM

## 2011-10-04 DIAGNOSIS — I120 Hypertensive chronic kidney disease with stage 5 chronic kidney disease or end stage renal disease: Secondary | ICD-10-CM | POA: Insufficient documentation

## 2011-10-04 DIAGNOSIS — Z992 Dependence on renal dialysis: Principal | ICD-10-CM

## 2011-10-04 DIAGNOSIS — I10 Essential (primary) hypertension: Secondary | ICD-10-CM | POA: Diagnosis present

## 2011-10-04 DIAGNOSIS — E1122 Type 2 diabetes mellitus with diabetic chronic kidney disease: Secondary | ICD-10-CM | POA: Diagnosis present

## 2011-10-04 DIAGNOSIS — N19 Unspecified kidney failure: Secondary | ICD-10-CM | POA: Diagnosis present

## 2011-10-04 LAB — CBC WITH DIFFERENTIAL/PLATELET
Basophils Absolute: 0 10*3/uL (ref 0.0–0.1)
Eosinophils Absolute: 0.2 10*3/uL (ref 0.0–0.7)
Eosinophils Relative: 3 % (ref 0–5)
HCT: 29.3 % — ABNORMAL LOW (ref 36.0–46.0)
Lymphocytes Relative: 30 % (ref 12–46)
MCH: 23.4 pg — ABNORMAL LOW (ref 26.0–34.0)
MCHC: 32.1 g/dL (ref 30.0–36.0)
MCV: 73.1 fL — ABNORMAL LOW (ref 78.0–100.0)
Monocytes Absolute: 0.9 10*3/uL (ref 0.1–1.0)
Platelets: 173 10*3/uL (ref 150–400)
RDW: 21.6 % — ABNORMAL HIGH (ref 11.5–15.5)
WBC: 7.3 10*3/uL (ref 4.0–10.5)

## 2011-10-04 LAB — COMPREHENSIVE METABOLIC PANEL
ALT: 26 U/L (ref 0–35)
AST: 28 U/L (ref 0–37)
CO2: 27 mEq/L (ref 19–32)
Calcium: 9.8 mg/dL (ref 8.4–10.5)
Creatinine, Ser: 8.6 mg/dL — ABNORMAL HIGH (ref 0.50–1.10)
GFR calc Af Amer: 5 mL/min — ABNORMAL LOW (ref >90)
GFR calc non Af Amer: 4 mL/min — ABNORMAL LOW (ref >90)
Sodium: 142 mEq/L (ref 135–145)
Total Protein: 7.3 g/dL (ref 6.0–8.3)

## 2011-10-04 MED ORDER — HEPARIN SODIUM (PORCINE) 1000 UNIT/ML DIALYSIS
1100.0000 [IU] | INTRAMUSCULAR | Status: DC | PRN
  Administered 2011-10-04: 1100 [IU] via INTRAVENOUS_CENTRAL
  Filled 2011-10-04: qty 2

## 2011-10-04 MED ORDER — PARICALCITOL 5 MCG/ML IV SOLN
INTRAVENOUS | Status: AC
  Administered 2011-10-04: 1 ug via INTRAVENOUS
  Filled 2011-10-04: qty 1

## 2011-10-04 MED ORDER — PARICALCITOL 5 MCG/ML IV SOLN
1.0000 ug | Freq: Once | INTRAVENOUS | Status: AC
  Administered 2011-10-04: 1 ug via INTRAVENOUS
  Filled 2011-10-04: qty 0.2

## 2011-10-04 NOTE — Consult Note (Signed)
Pt. In the ER .she is eating dialysis orders written. Marland Kitchen

## 2011-10-04 NOTE — H&P (Signed)
[facility] History and Physical  Shanvi Moyd ZOX:[id number] DOB: 1945-08-19    PCP:   [name], MD   Chief Complaint: Here for routine dialysis   HPI: Alexandra [name] is an 66 y.o. female with past medical history of Chronic kidney disease; Diabetes mellitus; Hyperlipidemia; Hypertension; Renal disorder; Dialysis care; and Gout due to renal impairment, presents here for routine dialysis. Dialysis order is written by Dr. Leretha Dykes her nephrologist. She offered no other complaints today.   Rewiew of Systems:   Constitutional: Negative for malaise, fever and chills. No significant weight loss or weight gain Eyes: Negative for eye pain, redness and discharge, diplopia, visual changes, or flashes of light. ENMT: Negative for ear pain, hoarseness, nasal congestion, sinus pressure and sore throat. No headaches; tinnitus, drooling, or problem swallowing. Cardiovascular: Negative for chest pain, palpitations, diaphoresis, dyspnea and peripheral edema. ; No orthopnea, PND Respiratory: Negative for cough, hemoptysis, wheezing and stridor. No pleuritic chestpain. Gastrointestinal: Negative for nausea, vomiting, diarrhea, constipation, abdominal pain, melena, blood in stool, hematemesis, jaundice and rectal bleeding.    Genitourinary: Negative for frequency, dysuria, incontinence,flank pain and hematuria; Musculoskeletal: Negative for back pain and neck pain. Negative for swelling and trauma.;  Skin: . Negative for pruritus, rash, abrasions, bruising and skin lesion.; ulcerations Neuro: Negative for headache, lightheadedness and neck stiffness. Negative for weakness, altered level of consciousness , altered mental status, extremity weakness, burning feet, involuntary movement, seizure and syncope.  Psych: negative for anxiety, depression, insomnia, tearfulness, panic attacks, hallucinations, paranoia, suicidal or homicidal ideation     Past Medical History  Diagnosis Date   . Chronic kidney disease     esrd  . Diabetes mellitus   . Hyperlipidemia   . Hypertension   . Renal disorder   . Dialysis care     tues, thurs, sat  . 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 Carotid endarterectomy 2002    left  . Btl   . Rectal fissurectomy   . Av fistula placement 10/30/2010    right brachiocephalic   . Fistulogram     Medications:  HOME MEDS: Prior to Admission medications   Medication Sig Start Date End Date Taking? Authorizing Provider  allopurinol (ZYLOPRIM) 100 MG tablet Take 100 mg by mouth daily.     Yes Historical Provider, MD  amLODipine (NORVASC) 5 MG tablet Take 10 mg by mouth daily. 07/13/11  Yes [name], MD  calcium acetate (PHOSLO) 667 MG capsule Take 667 mg by mouth 4 (four) times daily.    Yes Historical Provider, MD  cloNIDine (CATAPRES - DOSED IN MG/24 HR) 0.1 mg/24hr patch Place 1 patch onto the skin once a week. Changes on friday   Yes Historical Provider, MD  Coenzyme Q10 150 MG CAPS Take 300 mg by mouth daily. Hold while in hospital   Yes Historical Provider, MD  docusate sodium (COLACE) 100 MG capsule Take 200 mg by mouth at bedtime.     Yes Historical Provider, MD  hydrALAZINE (APRESOLINE) 25 MG tablet Take 50 mg by mouth 3 (three) times daily.  07/18/11  Yes [name], MD  l-methylfolate-B6-B12 (METANX) 3-35-2 MG TABS Take 1 tablet by mouth 2 (two) times daily.    Yes Historical Provider, MD  losartan (COZAAR) 50 MG tablet Take 100 mg by mouth at bedtime.    Yes Historical Provider, MD  metoprolol succinate (TOPROL-XL) 50 MG 24 hr tablet Take 100 mg by  mouth 2 (two) times daily. Takes after dialysis (T, Th, Sat)   Yes Historical Provider, MD  multivitamin (RENA-VIT) TABS tablet Take 1 tablet by mouth daily.     Yes Historical Provider, MD  repaglinide (PRANDIN) 0.5 MG tablet Take 0.5 mg by mouth 3 (three) times daily before meals.    Yes Historical Provider, MD     Allergies:  Allergies    Allergen Reactions  . Codeine Other (See Comments)    Passes out  . Epinephrine Other (See Comments)    Tachycardia, diaphoresis, syncope  . Percocet (Oxycodone-Acetaminophen) Other (See Comments)    Passes  out    Social History:   reports that she has been smoking Cigarettes.  She has a 50 pack-year smoking history. She has never used smokeless tobacco. She reports that she does not drink alcohol or use illicit drugs.  Family History: Family History  Problem Relation Age of Onset  . COPD Mother   . Kidney disease Mother   . Heart disease Father   . Lymphoma Son      Physical Exam: Filed Vitals:   10/04/11 1848  BP: 202/88  Pulse: 79  Temp: 97.9 F (36.6 C)  TempSrc: Oral  Resp: 18  SpO2: 97%   Blood pressure 202/88, pulse 79, temperature 97.9 F (36.6 C), temperature source Oral, resp. rate 18, SpO2 97.00%.  1. General: in No Acute distress  2. Psychological: Alert and Oriented  3. Head/ENT: Moist Mucous Membranes  Head Non traumatic, neck supple  4. SKIN: normal Skin turgor, Skin clean Dry and intact no rash  5. Heart: Regular rate and rhythm no Murmur, Rub or gallop  6. Lungs: Clear to auscultation bilaterally, no wheezes or crackles  7. Abdomen: Soft, non-tender, Non distended  8. Lower extremities: no clubbing, cyanosis, mild edema  9. Neurologically Grossly intact, moving all 4 extremities equally  10. MSK: Normal range of motion     Labs on Admission:  Basic Metabolic Panel:  Lab 10/02/11 9604 09/30/11 0121 09/27/11 2334  NA 139 140 137  K 4.2 4.2 3.8  CL 97 97 95*  CO2 26 28 28   GLUCOSE 121* 98 109*  BUN 64* 58* 70*  CREATININE 7.34* 6.89* 8.37*  CALCIUM 9.9 9.9 9.8  MG -- -- --  PHOS 2.3 2.6 3.1   Liver Function Tests:  Lab 10/02/11 0653 09/30/11 0121 09/27/11 2334  AST -- -- --  ALT -- -- --  ALKPHOS -- -- --  BILITOT -- -- --  PROT -- -- --  ALBUMIN 3.7 3.5 3.4*   No results found for this basename: LIPASE:5,AMYLASE:5 in the  last 168 hours No results found for this basename: AMMONIA:5 in the last 168 hours CBC:  Lab 10/02/11 0517 09/30/11 0122 09/27/11 2334  WBC 9.1 7.7 7.5  NEUTROABS 4.8 3.5 3.0  HGB 9.8* 9.6* 9.5*  HCT 31.3* 30.5* 29.9*  MCV 71.8* 72.3* 72.0*  PLT 156 158 165   Cardiac Enzymes: No results found for this basename: CKTOTAL:5,CKMB:5,CKMBINDEX:5,TROPONINI:5 in the last 168 hours  CBG:  Lab 10/02/11 0351 10/02/11 0127 10/01/11 2341  GLUCAP 155* 216* 211*     Radiological Exams on Admission: No results found.     Assessment/Plan Present on Admission:  .Renal failure .HTN (hypertension), malignant .Anemia of renal disease .Diabetes mellitus .Hyperlipidemia   PLAN: She is admitted under observation status [facility]. She will proceed with renal dialysis as per Dr. Leretha Dykes. She is stable otherwise and has no complaints.  I have resumed all her medications.  Other plans as per orders.  Code Status: Golda Acre, MD. [facility] Pager [phone] 7pm to 7am.  10/04/2011, 10:09 PM

## 2011-10-04 NOTE — ED Notes (Signed)
Transported to dialysis unit via wheelchair per tech

## 2011-10-04 NOTE — ED Notes (Signed)
Pt given two warm blankets and hot water for her tea

## 2011-10-04 NOTE — ED Notes (Signed)
Pt given chair to prop feet up for comfort.  Still refuses to lay in bed.

## 2011-10-04 NOTE — ED Notes (Signed)
Ordered renal diet for pt.  

## 2011-10-04 NOTE — ED Notes (Signed)
Spoke with dialysis RN - states unable to take pt at this time; will be approx. 1 hr - will call me when ready

## 2011-10-04 NOTE — ED Provider Notes (Signed)
History     CSN: [id number]  Arrival date & time 10/04/11  4098   First MD Initiated Contact with Patient 10/04/11 1904      Chief Complaint  Patient presents with  . Routine Dialysis     (Consider location/radiation/quality/duration/timing/severity/associated sxs/prior treatment) The history is provided by the patient.   patient is a chronic dialysis patient that comes here for dialysis. She's been kicked out of all of her dialysis centers. She's not dialyze to the ER and admitted to the hospital. She has no complaints.  Past Medical History  Diagnosis Date  . Chronic kidney disease     esrd  . Diabetes mellitus   . Hyperlipidemia   . Hypertension   . Renal disorder   . Dialysis care     tues, thurs, sat  . 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 Carotid endarterectomy 2002    left  . Btl   . Rectal fissurectomy   . Av fistula placement 10/30/2010    right brachiocephalic   . Fistulogram     Family History  Problem Relation Age of Onset  . COPD Mother   . Kidney disease Mother   . Heart disease Father   . Lymphoma Son     History  Substance Use Topics  . Smoking status: Current Everyday Smoker -- 1.0 packs/day for 50 years    Types: Cigarettes  . Smokeless tobacco: Never Used  . Alcohol Use: No    OB History    Grav Para Term Preterm Abortions TAB SAB Ect Mult Living                  Review of Systems  Constitutional: Negative for fever and diaphoresis.  Respiratory: Negative for cough and chest tightness.   Cardiovascular: Negative for chest pain.  Neurological: Negative for weakness and numbness.    Allergies  Codeine; Epinephrine; and Percocet  Home Medications   Current Outpatient Rx  Name Route Sig Dispense Refill  . ALLOPURINOL 100 MG PO TABS Oral Take 100 mg by mouth daily.      Marland Kitchen AMLODIPINE BESYLATE 5 MG PO TABS Oral Take 10 mg by mouth daily.    Marland Kitchen CALCIUM ACETATE 667 MG PO CAPS Oral Take 667 mg  by mouth 4 (four) times daily.     Marland Kitchen CLONIDINE HCL 0.1 MG/24HR TD PTWK Transdermal Place 1 patch onto the skin once a week. Changes on friday    . COENZYME Q10 150 MG PO CAPS Oral Take 300 mg by mouth daily. Hold while in hospital    . DOCUSATE SODIUM 100 MG PO CAPS Oral Take 200 mg by mouth at bedtime.      Marland Kitchen HYDRALAZINE HCL 25 MG PO TABS Oral Take 50 mg by mouth 3 (three) times daily.     . L-METHYLFOLATE-B6-B12 3-35-2 MG PO TABS Oral Take 1 tablet by mouth 2 (two) times daily.     Marland Kitchen LOSARTAN POTASSIUM 50 MG PO TABS Oral Take 100 mg by mouth at bedtime.     Marland Kitchen METOPROLOL SUCCINATE ER 50 MG PO TB24 Oral Take 100 mg by mouth 2 (two) times daily. Takes after dialysis (T, Th, Sat)    . RENA-VITE PO TABS Oral Take 1 tablet by mouth daily.      Marland Kitchen REPAGLINIDE 0.5 MG PO TABS Oral Take 0.5 mg by mouth 3 (three) times daily before meals.       BP  202/88  Pulse 79  Temp 97.9 F (36.6 C) (Oral)  Resp 18  SpO2 97%  Physical Exam  Constitutional: She appears well-developed and well-nourished.  Cardiovascular: Normal rate and regular rhythm.   Pulmonary/Chest: Effort normal and breath sounds normal.    ED Course  Procedures (including critical care time)   Labs Reviewed  CBC WITH DIFFERENTIAL  COMPREHENSIVE METABOLIC PANEL  CBC WITH DIFFERENTIAL  RENAL FUNCTION PANEL   No results found.   1. End stage renal disease   2. Anemia of chronic disease   3. Diabetes mellitus       MDM  Patient is in the ER for her 3 time weekly dialysis. No complaints. She'll be admitted to medicine.        Juliet Rude. Rubin Payor, MD 10/04/11 2243

## 2011-10-04 NOTE — ED Notes (Signed)
Pt reports here for routine dialysis; denies SOB, CP; had last dialysis on Saturday

## 2011-10-04 NOTE — ED Notes (Signed)
Pt tray delivered.  Pt seems comfortable and content

## 2011-10-04 NOTE — ED Notes (Signed)
Spoke with Consulting civil engineer, pt would be most appropriate for CDU or POD C, states they have provider that would clear her to wait for Dialysis; pt updated; spoke with Drinda Butts RN in CDU

## 2011-10-04 NOTE — Progress Notes (Signed)
tx stopped, disconnected pt from lines, daialyzer filling up w/ air, all attempts to remvoe air from dialyzer were unsuccessful, ran 2 bags of saline throught it, still passing air, not safe to use, set up new system, blood was lost and unable to be given back to pt d/t air in lines, could not find where air was coming from, all connections were tight and secure but dialyzer kept circulation air, pt aware of blood loss

## 2011-10-04 NOTE — ED Notes (Signed)
Pt given more warm blankets to sit on for comfort

## 2011-10-04 NOTE — ED Notes (Signed)
Pt states she is uncomfortable in wheel chair but refused to lay in bed.  Recliner requested but is unavailable at this time

## 2011-10-04 NOTE — ED Notes (Signed)
Pt refuses to have labs drawn in ED- states lab work always drawn upstairs in dialysis- hemodialysis notified that pt has gone to ED- pt needs TRH and admit orders prior to coming upstairs

## 2011-10-05 LAB — GLUCOSE, CAPILLARY: Glucose-Capillary: 132 mg/dL — ABNORMAL HIGH (ref 70–99)

## 2011-10-05 NOTE — Progress Notes (Signed)
Pt awake, vss, now agreeable to leaving unit after taking an hour long nap, pt left unit w/ tech in w/c to d/c home

## 2011-10-05 NOTE — Progress Notes (Signed)
Pt bp dropped to 73, tx stopped and pt rinsed back, after rinse back sbp back up to 161 but pt did not want to leave yet, monitored pt for another 15 min, bp now back over 200 which is her baseline, still did not want to leave.  Will cont to monitor

## 2011-10-06 ENCOUNTER — Observation Stay (HOSPITAL_COMMUNITY)
Admission: EM | Admit: 2011-10-06 | Discharge: 2011-10-07 | Disposition: A | Discharge status: Home / Self Care | Payer: Medicare HMO | Attending: Emergency Medicine | Admitting: Emergency Medicine

## 2011-10-06 ENCOUNTER — Encounter (HOSPITAL_COMMUNITY): Payer: Self-pay | Admitting: *Deleted

## 2011-10-06 ENCOUNTER — Emergency Department (HOSPITAL_COMMUNITY): Payer: Medicare HMO

## 2011-10-06 DIAGNOSIS — I10 Essential (primary) hypertension: Secondary | ICD-10-CM

## 2011-10-06 DIAGNOSIS — E785 Hyperlipidemia, unspecified: Secondary | ICD-10-CM | POA: Diagnosis present

## 2011-10-06 DIAGNOSIS — Z992 Dependence on renal dialysis: Secondary | ICD-10-CM | POA: Insufficient documentation

## 2011-10-06 DIAGNOSIS — I120 Hypertensive chronic kidney disease with stage 5 chronic kidney disease or end stage renal disease: Secondary | ICD-10-CM | POA: Insufficient documentation

## 2011-10-06 DIAGNOSIS — N186 End stage renal disease: Secondary | ICD-10-CM

## 2011-10-06 DIAGNOSIS — E119 Type 2 diabetes mellitus without complications: Secondary | ICD-10-CM

## 2011-10-06 DIAGNOSIS — D638 Anemia in other chronic diseases classified elsewhere: Secondary | ICD-10-CM

## 2011-10-06 LAB — GLUCOSE, CAPILLARY: Glucose-Capillary: 161 mg/dL — ABNORMAL HIGH (ref 70–99)

## 2011-10-06 MED ORDER — PARICALCITOL 5 MCG/ML IV SOLN
INTRAVENOUS | Status: AC
  Filled 2011-10-06: qty 1

## 2011-10-06 MED ORDER — DARBEPOETIN ALFA-POLYSORBATE 100 MCG/0.5ML IJ SOLN
INTRAMUSCULAR | Status: DC
Start: 2011-10-06 — End: 2011-10-07
  Filled 2011-10-06: qty 0.5

## 2011-10-06 MED ORDER — HEPARIN SODIUM (PORCINE) 1000 UNIT/ML DIALYSIS
1100.0000 [IU] | INTRAMUSCULAR | Status: DC | PRN
  Administered 2011-10-06: 1100 [IU] via INTRAVENOUS_CENTRAL
  Filled 2011-10-06: qty 2

## 2011-10-06 MED ORDER — DARBEPOETIN ALFA-POLYSORBATE 100 MCG/0.5ML IJ SOLN
100.0000 ug | Freq: Once | INTRAMUSCULAR | Status: AC
  Administered 2011-10-06: 100 ug via INTRAVENOUS
  Filled 2011-10-06: qty 0.5

## 2011-10-06 MED ORDER — PARICALCITOL 5 MCG/ML IV SOLN
1.0000 ug | Freq: Once | INTRAVENOUS | Status: AC
  Administered 2011-10-06: 1 ug via INTRAVENOUS
  Filled 2011-10-06: qty 0.2

## 2011-10-06 NOTE — ED Notes (Signed)
PT waiting for admission for dialysis.

## 2011-10-06 NOTE — ED Notes (Signed)
Pt states nothing is changed. Blood pressure elevated.  Pt states that is normal for her.

## 2011-10-06 NOTE — ED Notes (Signed)
Dr Audrie Lia responded to page.  Charge RN Clydie Braun aware and is working on a room.

## 2011-10-06 NOTE — H&P (Signed)
Alexandra [name] is an 66 y.o. female.   Chief Complaint: Needs dialysis HPI:  66 year-old female with end-stage renal disease who is here for her hemodialysis. Patient has been in and out of the hospital almost every 2 days for hemodialysis. She has no significant complaint at this time except for mild shortness of breath. She has her hemodialysis orders already. Denied any cough. No fever no nausea vomiting or diarrhea. She has been taking all her medications as prescribed. She was just seen 2 days ago and hemodialyze. The only significant finding today is her blood pressure is very much elevated. Patient says she took her medicines without any change. She has a very active in the day a working. Patient prefers to come in the room tonight rather than during the day.   .  Past Medical History  Diagnosis Date  . Chronic kidney disease     esrd  . Diabetes mellitus   . Hyperlipidemia   . Hypertension   . Renal disorder   . Dialysis care     tues, thurs, sat  . 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 Carotid endarterectomy 2002    left  . Btl   . Rectal fissurectomy   . Av fistula placement 10/30/2010    right brachiocephalic   . Fistulogram     Family History  Problem Relation Age of Onset  . COPD Mother   . Kidney disease Mother   . Heart disease Father   . Lymphoma Son    Social History:  reports that she has been smoking Cigarettes.  She has a 50 pack-year smoking history. She has never used smokeless tobacco. She reports that she does not drink alcohol or use illicit drugs.  Allergies:  Allergies  Allergen Reactions  . Codeine Other (See Comments)    Passes out  . Epinephrine Other (See Comments)    Tachycardia, diaphoresis, syncope  . Percocet (Oxycodone-Acetaminophen) Other (See Comments)    Passes  out     (Not in a hospital admission)  Results for orders placed during the hospital encounter of 10/06/11 (from the past  48 hour(s))  GLUCOSE, CAPILLARY     Status: Abnormal   Collection Time   10/06/11  9:17 PM      Component Value Range Comment   Glucose-Capillary 161 (*) 70 - 99 mg/dL    No results found.  Review of Systems  Constitutional: Negative.   HENT: Negative.   Eyes: Negative.   Respiratory: Negative.   Gastrointestinal: Negative.   Genitourinary: Negative.   Musculoskeletal: Negative.   Skin: Negative.   Neurological: Negative.   Endo/Heme/Allergies: Negative.   Psychiatric/Behavioral: Negative.     Blood pressure 204/48, pulse 80, temperature 98.2 F (36.8 C), temperature source Oral, resp. rate 18, SpO2 99.00%. Physical Exam  Constitutional: She appears well-developed and well-nourished.  HENT:  Head: Normocephalic and atraumatic.  Right Ear: External ear normal.  Left Ear: External ear normal.  Nose: Nose normal.  Mouth/Throat: Oropharynx is clear and moist.  Eyes: Conjunctivae and EOM are normal. Pupils are equal, round, and reactive to light.  Neck: Normal range of motion. Neck supple.  Cardiovascular: Normal rate, regular rhythm, normal heart sounds and intact distal pulses.   Respiratory: No respiratory distress. She has no wheezes. She has rales. She exhibits no tenderness.  GI: Soft. Bowel sounds are normal.  Musculoskeletal: Normal range of motion.  Neurological: She is alert. She  has normal reflexes.  Skin: Skin is warm and dry.  Psychiatric: Her speech is normal. Judgment and thought content normal. Her affect is angry. She is agitated. Cognition and memory are normal.     Assessment/Plan 66 year old female here for hemodialysis. Also hypertensive urgency or malignancy.  Plan #1 end-stage renal disease: Patient will be admitted for observation on hemodialysis. Once again I discussed with patient that she should probably change the timing of her coming in is in the middle of the night. I have informed patient that she should probably think about coming during the  day when we have more staff available. We also do not have to pull in staff to come in and give her hemodialysis at night.  #2 anemia of chronic disease: This is stable continue current pathology.  #3 malignant hypertension: Most likely secondary to need for hemodialysis as well as her antihypertensives.  #4 diabetes: Continue his home medications and sliding scale insulin.  Burnard Enis,LAWAL 10/06/2011, 10:43 PM

## 2011-10-06 NOTE — ED Provider Notes (Signed)
History     CSN: [id number]  Arrival date & time 10/06/11  2019   First MD Initiated Contact with Patient 10/06/11 2051      Chief Complaint  Patient presents with  . Vascular Access Problem    regular dialysis     The history is provided by the patient.   the patient presents for her routine dialysis.  She dialyzes Monday Wednesday Friday and her last dialysis was 2 days ago.  She has no complaints at this time.  Past Medical History  Diagnosis Date  . Chronic kidney disease     esrd  . Diabetes mellitus   . Hyperlipidemia   . Hypertension   . Renal disorder   . Dialysis care     tues, thurs, sat  . 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 Carotid endarterectomy 2002    left  . Btl   . Rectal fissurectomy   . Av fistula placement 10/30/2010    right brachiocephalic   . Fistulogram     Family History  Problem Relation Age of Onset  . COPD Mother   . Kidney disease Mother   . Heart disease Father   . Lymphoma Son     History  Substance Use Topics  . Smoking status: Current Everyday Smoker -- 1.0 packs/day for 50 years    Types: Cigarettes  . Smokeless tobacco: Never Used  . Alcohol Use: No    OB History    Grav Para Term Preterm Abortions TAB SAB Ect Mult Living                  Review of Systems  All other systems reviewed and are negative.    Allergies  Codeine; Epinephrine; and Percocet  Home Medications   Current Outpatient Rx  Name Route Sig Dispense Refill  . ALLOPURINOL 100 MG PO TABS Oral Take 100 mg by mouth daily.      Marland Kitchen AMLODIPINE BESYLATE 5 MG PO TABS Oral Take 10 mg by mouth daily.    Marland Kitchen CALCIUM ACETATE 667 MG PO CAPS Oral Take 667 mg by mouth 4 (four) times daily.     Marland Kitchen CLONIDINE HCL 0.1 MG/24HR TD PTWK Transdermal Place 1 patch onto the skin once a week. Changes on friday    . COENZYME Q10 150 MG PO CAPS Oral Take 300 mg by mouth daily. Hold while in hospital    . DOCUSATE SODIUM 100 MG PO  CAPS Oral Take 200 mg by mouth at bedtime.      Marland Kitchen HYDRALAZINE HCL 25 MG PO TABS Oral Take 50 mg by mouth 3 (three) times daily.     . L-METHYLFOLATE-B6-B12 3-35-2 MG PO TABS Oral Take 1 tablet by mouth 2 (two) times daily.     Marland Kitchen LOSARTAN POTASSIUM 50 MG PO TABS Oral Take 100 mg by mouth at bedtime.     Marland Kitchen METOPROLOL SUCCINATE ER 50 MG PO TB24 Oral Take 100 mg by mouth 2 (two) times daily. Takes after dialysis (T, Th, Sat)    . RENA-VITE PO TABS Oral Take 1 tablet by mouth daily.      Marland Kitchen REPAGLINIDE 0.5 MG PO TABS Oral Take 0.5 mg by mouth 3 (three) times daily before meals.       BP 204/48  Pulse 80  Temp 98.2 F (36.8 C) (Oral)  Resp 18  SpO2 99%  Physical Exam  Nursing note and vitals reviewed.  Constitutional: She is oriented to person, place, and time. She appears well-developed and well-nourished. No distress.  HENT:  Head: Normocephalic and atraumatic.  Eyes: EOM are normal.  Neck: Normal range of motion.  Cardiovascular: Normal rate and regular rhythm.   Pulmonary/Chest: Effort normal.  Abdominal: Soft.  Musculoskeletal: Normal range of motion.  Neurological: She is alert and oriented to person, place, and time.  Skin: Skin is warm and dry.  Psychiatric: She has a normal mood and affect. Judgment normal.    ED Course  Procedures (including critical care time)  Labs Reviewed - No data to display No results found.   No diagnosis found.    MDM  Dr. Bascom Levels at the bedside.  The patient will be admitted for her routine dialysis        Lyanne Co, MD 10/06/11 2124

## 2011-10-06 NOTE — Consult Note (Signed)
  Pt. In ER. Feels well .dialysis orders written.

## 2011-10-07 ENCOUNTER — Observation Stay (HOSPITAL_COMMUNITY): Admit: 2011-10-07 | Discharge: 2011-10-07 | Disposition: A | Discharge status: Home / Self Care | Payer: Medicare HMO

## 2011-10-07 LAB — RENAL FUNCTION PANEL
Albumin: 3.3 g/dL — ABNORMAL LOW (ref 3.5–5.2)
BUN: 71 mg/dL — ABNORMAL HIGH (ref 6–23)
Calcium: 9.4 mg/dL (ref 8.4–10.5)
Phosphorus: 2.9 mg/dL (ref 2.3–4.6)
Potassium: 4.2 mEq/L (ref 3.5–5.1)

## 2011-10-07 LAB — CBC WITH DIFFERENTIAL/PLATELET
Basophils Absolute: 0.1 10*3/uL (ref 0.0–0.1)
Lymphocytes Relative: 34 % (ref 12–46)
Lymphs Abs: 2.5 10*3/uL (ref 0.7–4.0)
MCV: 73.5 fL — ABNORMAL LOW (ref 78.0–100.0)
Monocytes Relative: 14 % — ABNORMAL HIGH (ref 3–12)
Platelets: 155 10*3/uL (ref 150–400)
RDW: 22.5 % — ABNORMAL HIGH (ref 11.5–15.5)
WBC: 7.3 10*3/uL (ref 4.0–10.5)

## 2011-10-07 MED ORDER — HYDROCORTISONE 1 % EX CREA
TOPICAL_CREAM | Freq: Once | CUTANEOUS | Status: DC
  Filled 2011-10-07: qty 28

## 2011-10-07 MED ORDER — METOPROLOL TARTRATE 25 MG PO TABS
100.0000 mg | ORAL_TABLET | Freq: Two times a day (BID) | ORAL | Status: DC
  Filled 2011-10-07: qty 4

## 2011-10-07 NOTE — Progress Notes (Signed)
Pt hemoglobin 8.6, aranesp 100 mcg administered qwk on wednesdays. Pt claimed that aranesp caused ger dry cough, sob, and arm edema. Left a written note on the nurses station for Dr. Bascom Levels to inform him of possible allergy to aranesp and pt. Desired to switch to epogen instead.

## 2011-10-08 ENCOUNTER — Emergency Department (HOSPITAL_COMMUNITY): Admit: 2011-10-08 | Discharge: 2011-10-08 | Disposition: A | Discharge status: Home / Self Care | Payer: Medicare HMO

## 2011-10-08 ENCOUNTER — Encounter (HOSPITAL_COMMUNITY): Payer: Self-pay | Admitting: Emergency Medicine

## 2011-10-08 ENCOUNTER — Observation Stay (HOSPITAL_BASED_OUTPATIENT_CLINIC_OR_DEPARTMENT_OTHER)
Admission: EM | Admit: 2011-10-08 | Discharge: 2011-10-09 | Disposition: A | Discharge status: Home / Self Care | Payer: Medicare HMO | Source: Home / Self Care | Attending: Emergency Medicine | Admitting: Emergency Medicine

## 2011-10-08 DIAGNOSIS — F17200 Nicotine dependence, unspecified, uncomplicated: Secondary | ICD-10-CM | POA: Diagnosis present

## 2011-10-08 DIAGNOSIS — Z992 Dependence on renal dialysis: Secondary | ICD-10-CM | POA: Insufficient documentation

## 2011-10-08 DIAGNOSIS — N186 End stage renal disease: Secondary | ICD-10-CM | POA: Diagnosis present

## 2011-10-08 DIAGNOSIS — D72829 Elevated white blood cell count, unspecified: Secondary | ICD-10-CM | POA: Diagnosis present

## 2011-10-08 DIAGNOSIS — I214 Non-ST elevation (NSTEMI) myocardial infarction: Secondary | ICD-10-CM | POA: Diagnosis not present

## 2011-10-08 DIAGNOSIS — Z91199 Patient's noncompliance with other medical treatment and regimen due to unspecified reason: Secondary | ICD-10-CM

## 2011-10-08 DIAGNOSIS — K5900 Constipation, unspecified: Secondary | ICD-10-CM | POA: Diagnosis present

## 2011-10-08 DIAGNOSIS — N189 Chronic kidney disease, unspecified: Secondary | ICD-10-CM | POA: Diagnosis present

## 2011-10-08 DIAGNOSIS — D638 Anemia in other chronic diseases classified elsewhere: Secondary | ICD-10-CM

## 2011-10-08 DIAGNOSIS — I5032 Chronic diastolic (congestive) heart failure: Secondary | ICD-10-CM | POA: Diagnosis present

## 2011-10-08 DIAGNOSIS — E1122 Type 2 diabetes mellitus with diabetic chronic kidney disease: Secondary | ICD-10-CM | POA: Diagnosis present

## 2011-10-08 DIAGNOSIS — J9600 Acute respiratory failure, unspecified whether with hypoxia or hypercapnia: Principal | ICD-10-CM | POA: Diagnosis present

## 2011-10-08 DIAGNOSIS — I120 Hypertensive chronic kidney disease with stage 5 chronic kidney disease or end stage renal disease: Secondary | ICD-10-CM

## 2011-10-08 DIAGNOSIS — D649 Anemia, unspecified: Secondary | ICD-10-CM | POA: Diagnosis present

## 2011-10-08 DIAGNOSIS — E46 Unspecified protein-calorie malnutrition: Secondary | ICD-10-CM | POA: Diagnosis present

## 2011-10-08 DIAGNOSIS — E119 Type 2 diabetes mellitus without complications: Secondary | ICD-10-CM | POA: Diagnosis present

## 2011-10-08 DIAGNOSIS — Z9119 Patient's noncompliance with other medical treatment and regimen: Secondary | ICD-10-CM

## 2011-10-08 DIAGNOSIS — E785 Hyperlipidemia, unspecified: Secondary | ICD-10-CM | POA: Insufficient documentation

## 2011-10-08 DIAGNOSIS — D631 Anemia in chronic kidney disease: Secondary | ICD-10-CM | POA: Diagnosis present

## 2011-10-08 DIAGNOSIS — G9340 Encephalopathy, unspecified: Secondary | ICD-10-CM | POA: Diagnosis present

## 2011-10-08 DIAGNOSIS — E7800 Pure hypercholesterolemia, unspecified: Secondary | ICD-10-CM | POA: Diagnosis present

## 2011-10-08 DIAGNOSIS — I509 Heart failure, unspecified: Secondary | ICD-10-CM | POA: Diagnosis present

## 2011-10-08 MED ORDER — PARICALCITOL 5 MCG/ML IV SOLN
INTRAVENOUS | Status: AC
  Filled 2011-10-08: qty 1

## 2011-10-08 MED ORDER — PARICALCITOL 5 MCG/ML IV SOLN
1.0000 ug | Freq: Once | INTRAVENOUS | Status: AC
  Administered 2011-10-08: 1 ug via INTRAVENOUS

## 2011-10-08 MED ORDER — HEPARIN SODIUM (PORCINE) 1000 UNIT/ML DIALYSIS: 20.0000 [IU]/kg | INTRAMUSCULAR | Status: DC | PRN

## 2011-10-08 NOTE — Consult Note (Signed)
Pt. Is in ER.  Dialysis orders written.

## 2011-10-08 NOTE — ED Notes (Signed)
Pt here for dialysis. Has no other complaints

## 2011-10-08 NOTE — ED Notes (Signed)
Pt here for dialysis.  Dr. Bascom Levels at triage to assess pt.

## 2011-10-09 ENCOUNTER — Observation Stay (HOSPITAL_COMMUNITY): Admit: 2011-10-09 | Discharge: 2011-10-09 | Disposition: A | Discharge status: Home / Self Care | Payer: Medicare HMO

## 2011-10-09 DIAGNOSIS — N186 End stage renal disease: Secondary | ICD-10-CM

## 2011-10-09 DIAGNOSIS — D638 Anemia in other chronic diseases classified elsewhere: Secondary | ICD-10-CM

## 2011-10-09 DIAGNOSIS — Z992 Dependence on renal dialysis: Secondary | ICD-10-CM

## 2011-10-09 DIAGNOSIS — E119 Type 2 diabetes mellitus without complications: Secondary | ICD-10-CM

## 2011-10-09 LAB — CBC WITH DIFFERENTIAL/PLATELET
Basophils Absolute: 0 10*3/uL (ref 0.0–0.1)
Eosinophils Relative: 3 % (ref 0–5)
HCT: 27.1 % — ABNORMAL LOW (ref 36.0–46.0)
Lymphocytes Relative: 35 % (ref 12–46)
MCV: 73.8 fL — ABNORMAL LOW (ref 78.0–100.0)
Monocytes Absolute: 0.8 10*3/uL (ref 0.1–1.0)
RDW: 22.8 % — ABNORMAL HIGH (ref 11.5–15.5)
WBC: 5.9 10*3/uL (ref 4.0–10.5)

## 2011-10-09 LAB — RENAL FUNCTION PANEL
BUN: 53 mg/dL — ABNORMAL HIGH (ref 6–23)
CO2: 28 mEq/L (ref 19–32)
Chloride: 96 mEq/L (ref 96–112)
Glucose, Bld: 208 mg/dL — ABNORMAL HIGH (ref 70–99)
Phosphorus: 3.4 mg/dL (ref 2.3–4.6)
Potassium: 3.8 mEq/L (ref 3.5–5.1)

## 2011-10-09 MED ORDER — DARBEPOETIN ALFA-POLYSORBATE 25 MCG/0.42ML IJ SOLN
INTRAMUSCULAR | Status: AC
  Filled 2011-10-09: qty 0.42

## 2011-10-09 NOTE — H&P (Signed)
[facility] History and Physical  Alexandra [name] WJX:[id number] DOB: [birth year]    PCP:   [name], MD   Chief Complaint: Here for routine dialysis.  HPI: Alexandra [name] is an 66 y.o. female with ESRD, AODM, HTN, DM, gout, presents here for her routine dialysis.  SHe has no complaint today.  Denied CP, SOB, swelling, fever, chills, n/v, black or bloody stool.  She already has dialysis orders and Dr Bascom Levels has seen her in the ER.  Rewiew of Systems:  Constitutional: Negative for malaise, fever and chills. No significant weight loss or weight gain Eyes: Negative for eye pain, redness and discharge, diplopia, visual changes, or flashes of light. ENMT: Negative for ear pain, hoarseness, nasal congestion, sinus pressure and sore throat. No headaches; tinnitus, drooling, or problem swallowing. Cardiovascular: Negative for chest pain, palpitations, diaphoresis, dyspnea and peripheral edema. ; No orthopnea, PND Respiratory: Negative for cough, hemoptysis, wheezing and stridor. No pleuritic chestpain. Gastrointestinal: Negative for nausea, vomiting, diarrhea, constipation, abdominal pain, melena, blood in stool, hematemesis, jaundice and rectal bleeding.    Genitourinary: Negative for frequency, dysuria, incontinence,flank pain and hematuria; Musculoskeletal: Negative for back pain and neck pain. Negative for swelling and trauma.;  Skin: . Negative for pruritus, rash, abrasions, bruising and skin lesion.; ulcerations Neuro: Negative for headache, lightheadedness and neck stiffness. Negative for weakness, altered level of consciousness , altered mental status, extremity weakness, burning feet, involuntary movement, seizure and syncope.  Psych: negative for anxiety, depression, insomnia, tearfulness, panic attacks, hallucinations, paranoia, suicidal or homicidal ideation    Past Medical History  Diagnosis Date  . Chronic kidney disease     esrd  . Diabetes mellitus     . Hyperlipidemia   . Hypertension   . Renal disorder   . Dialysis care     tues, thurs, sat  . 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 Carotid endarterectomy 2002    left  . Btl   . Rectal fissurectomy   . Av fistula placement 10/30/2010    right brachiocephalic   . Fistulogram     Medications:  HOME MEDS: Prior to Admission medications   Medication Sig Start Date End Date Taking? Authorizing Provider  allopurinol (ZYLOPRIM) 100 MG tablet Take 100 mg by mouth daily.     Yes Historical Provider, MD  amLODipine (NORVASC) 5 MG tablet Take 10 mg by mouth daily. 07/13/11  Yes [name], MD  calcium acetate (PHOSLO) 667 MG capsule Take 667 mg by mouth 4 (four) times daily.    Yes Historical Provider, MD  cloNIDine (CATAPRES - DOSED IN MG/24 HR) 0.1 mg/24hr patch Place 1 patch onto the skin once a week. Changes on friday   Yes Historical Provider, MD  Coenzyme Q10 150 MG CAPS Take 300 mg by mouth daily. Hold while in hospital   Yes Historical Provider, MD  docusate sodium (COLACE) 100 MG capsule Take 200 mg by mouth at bedtime.     Yes Historical Provider, MD  hydrALAZINE (APRESOLINE) 25 MG tablet Take 50 mg by mouth 3 (three) times daily.  07/18/11  Yes [name], MD  l-methylfolate-B6-B12 (METANX) 3-35-2 MG TABS Take 1 tablet by mouth 2 (two) times daily.    Yes Historical Provider, MD  losartan (COZAAR) 50 MG tablet Take 100 mg by mouth at bedtime.    Yes Historical Provider, MD  metoprolol succinate (TOPROL-XL) 50 MG 24 hr tablet Take 100 mg by  mouth 2 (two) times daily. Takes after dialysis (T, Th, Sat)   Yes Historical Provider, MD  multivitamin (RENA-VIT) TABS tablet Take 1 tablet by mouth daily.     Yes Historical Provider, MD  repaglinide (PRANDIN) 0.5 MG tablet Take 0.5 mg by mouth 3 (three) times daily before meals.    Yes Historical Provider, MD     Allergies:  Allergies  Allergen Reactions  . Codeine Other (See Comments)     Passes out  . Epinephrine Other (See Comments)    Tachycardia, diaphoresis, syncope  . Percocet (Oxycodone-Acetaminophen) Other (See Comments)    Passes  out    Social History:   reports that she has been smoking Cigarettes.  She has a 50 pack-year smoking history. She has never used smokeless tobacco. She reports that she does not drink alcohol or use illicit drugs.  Family History: Family History  Problem Relation Age of Onset  . COPD Mother   . Kidney disease Mother   . Heart disease Father   . Lymphoma Son      Physical Exam: Filed Vitals:   10/08/11 2156  BP: 204/67  Pulse: 83  Temp: 97.6 F (36.4 C)  TempSrc: Oral  Resp: 16  Weight: 58.514 kg (129 lb)  SpO2: 96%   Blood pressure 204/67, pulse 83, temperature 97.6 F (36.4 C), temperature source Oral, resp. rate 16, weight 58.514 kg (129 lb), SpO2 96.00%.  GEN:  Pleasant patient lying in the stretcher in no acute distress; cooperative with exam. PSYCH:  alert and oriented x4; does not appear anxious or depressed; affect is appropriate. HEENT: Mucous membranes pink and anicteric; PERRLA; EOM intact; no cervical lymphadenopathy nor thyromegaly or carotid bruit; no JVD; There were no stridor. Neck is very supple. Breasts:: Not examined CHEST WALL: No tenderness CHEST: Normal respiration, clear to auscultation bilaterally.  HEART: Regular rate and rhythm.  There are no murmur, rub, or gallops.   BACK: No kyphosis or scoliosis; no CVA tenderness ABDOMEN: soft and non-tender; no masses, no organomegaly, normal abdominal bowel sounds; no pannus; no intertriginous candida. There is no rebound and no distention. Rectal Exam: Not done EXTREMITIES: No bone or joint deformity; age-appropriate arthropathy of the hands and knees; no edema; no ulcerations.  There is no calf tenderness. Genitalia: not examined PULSES: 2+ and symmetric SKIN: Normal hydration no rash or ulceration CNS: Cranial nerves 2-12 grossly intact no focal  lateralizing neurologic deficit.  Speech is fluent; uvula elevated with phonation, facial symmetry and tongue midline. DTR are normal bilaterally, cerebella exam is intact, barbinski is negative and strengths are equaled bilaterally.  No sensory loss.   Labs on Admission:  Basic Metabolic Panel:  Lab 10/08/11 1610 10/06/11 2359 10/04/11 1855 10/02/11 0653  NA 138 142 142 139  K 3.8 4.2 4.2 4.2  CL 96 100 98 97  CO2 28 27 27 26   GLUCOSE 208* 131* 165* 121*  BUN 53* 71* 80* 64*  CREATININE 7.11* 7.69* 8.60* 7.34*  CALCIUM 9.1 9.4 9.8 9.9  MG -- -- -- --  PHOS 3.4 2.9 2.6 2.3   Liver Function Tests:  Lab 10/08/11 2221 10/06/11 2359 10/04/11 1855 10/02/11 0653  AST -- -- 28 --  ALT -- -- 26 --  ALKPHOS -- -- 126* --  BILITOT -- -- 0.3 --  PROT -- -- 7.3 --  ALBUMIN 3.3* 3.3* 3.4* 3.7   No results found for this basename: LIPASE:5,AMYLASE:5 in the last 168 hours No results found for this basename:  AMMONIA:5 in the last 168 hours CBC:  Lab 10/08/11 2221 10/06/11 2359 10/04/11 1855 10/02/11 0517  WBC 5.9 7.3 7.3 9.1  NEUTROABS 2.9 3.5 4.0 4.8  HGB 8.7* 8.6* 9.4* 9.8*  HCT 27.1* 27.4* 29.3* 31.3*  MCV 73.8* 73.5* 73.1* 71.8*  PLT 161 155 173 156   Cardiac Enzymes: No results found for this basename: CKTOTAL:5,CKMB:5,CKMBINDEX:5,TROPONINI:5 in the last 168 hours  CBG:  Lab 10/06/11 2117 10/05/11 0133 10/04/11 2136 10/02/11 0351 10/02/11 0127  GLUCAP 161* 132* 153* 155* 216*     Radiological Exams on Admission: No results found.   Assessment/Plan Present on Admission:  .ESRD (end stage renal disease) on dialysis .Diabetes mellitus .Anemia of renal disease   PLAN:  Proceed with routine dialysis as per Dr Bascom Levels.  Will continue her on her home meds.  She will be discharge after dialysis as planned if she does well.  Other plans as per orders.  Code Status: Golda Acre, MD. [facility] Pager [phone] 7pm to 7am.  10/09/2011, 1:18 AM

## 2011-10-09 NOTE — Progress Notes (Signed)
PT REQUESTED MEDICAL RECORDS AND SAID THAT SHERRY COLE, INTERM MANAGER IS AWARE OF SUCH PETITION. STATES THAT SHE ALREADY SIGNED THE REQUEST FORM AND RELEASE INFORMATION FORM. LEFT A WRITTEN NOTE ON SHERRY'S DOOR TO CONTACT PT FOR FURTHER INFORMATION.

## 2011-10-11 ENCOUNTER — Inpatient Hospital Stay (HOSPITAL_COMMUNITY): Payer: Medicare HMO

## 2011-10-11 ENCOUNTER — Inpatient Hospital Stay (HOSPITAL_COMMUNITY)
Admission: EM | Admit: 2011-10-11 | Discharge: 2011-10-21 | DRG: 208 | Disposition: A | Discharge status: Home / Self Care | Payer: Medicare HMO | Attending: Internal Medicine | Admitting: Internal Medicine

## 2011-10-11 ENCOUNTER — Emergency Department (HOSPITAL_COMMUNITY): Payer: Medicare HMO

## 2011-10-11 ENCOUNTER — Encounter (HOSPITAL_COMMUNITY): Payer: Self-pay | Admitting: Emergency Medicine

## 2011-10-11 ENCOUNTER — Other Ambulatory Visit: Payer: Self-pay

## 2011-10-11 DIAGNOSIS — T82898A Other specified complication of vascular prosthetic devices, implants and grafts, initial encounter: Secondary | ICD-10-CM

## 2011-10-11 DIAGNOSIS — J9601 Acute respiratory failure with hypoxia: Secondary | ICD-10-CM | POA: Diagnosis present

## 2011-10-11 DIAGNOSIS — E785 Hyperlipidemia, unspecified: Secondary | ICD-10-CM

## 2011-10-11 DIAGNOSIS — I5033 Acute on chronic diastolic (congestive) heart failure: Secondary | ICD-10-CM

## 2011-10-11 DIAGNOSIS — I5032 Chronic diastolic (congestive) heart failure: Secondary | ICD-10-CM

## 2011-10-11 DIAGNOSIS — M7989 Other specified soft tissue disorders: Secondary | ICD-10-CM

## 2011-10-11 DIAGNOSIS — R9431 Abnormal electrocardiogram [ECG] [EKG]: Secondary | ICD-10-CM

## 2011-10-11 DIAGNOSIS — D638 Anemia in other chronic diseases classified elsewhere: Secondary | ICD-10-CM

## 2011-10-11 DIAGNOSIS — J810 Acute pulmonary edema: Secondary | ICD-10-CM

## 2011-10-11 DIAGNOSIS — N189 Chronic kidney disease, unspecified: Secondary | ICD-10-CM

## 2011-10-11 DIAGNOSIS — I10 Essential (primary) hypertension: Secondary | ICD-10-CM

## 2011-10-11 DIAGNOSIS — N186 End stage renal disease: Secondary | ICD-10-CM

## 2011-10-11 DIAGNOSIS — N19 Unspecified kidney failure: Secondary | ICD-10-CM

## 2011-10-11 DIAGNOSIS — E1122 Type 2 diabetes mellitus with diabetic chronic kidney disease: Secondary | ICD-10-CM | POA: Diagnosis present

## 2011-10-11 DIAGNOSIS — J189 Pneumonia, unspecified organism: Secondary | ICD-10-CM

## 2011-10-11 DIAGNOSIS — Z992 Dependence on renal dialysis: Secondary | ICD-10-CM

## 2011-10-11 DIAGNOSIS — N2581 Secondary hyperparathyroidism of renal origin: Secondary | ICD-10-CM

## 2011-10-11 DIAGNOSIS — I120 Hypertensive chronic kidney disease with stage 5 chronic kidney disease or end stage renal disease: Secondary | ICD-10-CM

## 2011-10-11 DIAGNOSIS — Z8739 Personal history of other diseases of the musculoskeletal system and connective tissue: Secondary | ICD-10-CM

## 2011-10-11 DIAGNOSIS — J9600 Acute respiratory failure, unspecified whether with hypoxia or hypercapnia: Secondary | ICD-10-CM

## 2011-10-11 DIAGNOSIS — D72829 Elevated white blood cell count, unspecified: Secondary | ICD-10-CM

## 2011-10-11 LAB — CBC WITH DIFFERENTIAL/PLATELET
Basophils Absolute: 0 10*3/uL (ref 0.0–0.1)
Basophils Absolute: 0 10*3/uL (ref 0.0–0.1)
Basophils Relative: 0 % (ref 0–1)
Eosinophils Absolute: 0 10*3/uL (ref 0.0–0.7)
Eosinophils Absolute: 0.1 10*3/uL (ref 0.0–0.7)
Lymphocytes Relative: 4 % — ABNORMAL LOW (ref 12–46)
MCH: 23.2 pg — ABNORMAL LOW (ref 26.0–34.0)
MCHC: 31 g/dL (ref 30.0–36.0)
MCHC: 31.2 g/dL (ref 30.0–36.0)
Monocytes Absolute: 1.1 10*3/uL — ABNORMAL HIGH (ref 0.1–1.0)
Monocytes Relative: 4 % (ref 3–12)
Neutro Abs: 11.7 10*3/uL — ABNORMAL HIGH (ref 1.7–7.7)
Neutrophils Relative %: 92 % — ABNORMAL HIGH (ref 43–77)
Neutrophils Relative %: 67 % (ref 43–77)
Platelets: 179 10*3/uL (ref 150–400)
Platelets: 170 10*3/uL (ref 150–400)
RDW: 22.9 % — ABNORMAL HIGH (ref 11.5–15.5)
RDW: 22.9 % — ABNORMAL HIGH (ref 11.5–15.5)
WBC: 12.7 10*3/uL — ABNORMAL HIGH (ref 4.0–10.5)

## 2011-10-11 LAB — LACTIC ACID, PLASMA: Lactic Acid, Venous: 1.9 mmol/L (ref 0.5–2.2)

## 2011-10-11 LAB — BASIC METABOLIC PANEL
BUN: 62 mg/dL — ABNORMAL HIGH (ref 6–23)
Chloride: 96 mEq/L (ref 96–112)
GFR calc Af Amer: 6 mL/min — ABNORMAL LOW (ref >90)
GFR calc non Af Amer: 5 mL/min — ABNORMAL LOW (ref >90)
Potassium: 4.9 mEq/L (ref 3.5–5.1)
Sodium: 137 mEq/L (ref 135–145)

## 2011-10-11 LAB — CARDIAC PANEL(CRET KIN+CKTOT+MB+TROPI)
CK, MB: 9.2 ng/mL (ref 0.3–4.0)
Relative Index: 3.2 — ABNORMAL HIGH (ref 0.0–2.5)
Total CK: 263 U/L — ABNORMAL HIGH (ref 7–177)
Total CK: 288 U/L — ABNORMAL HIGH (ref 7–177)
Troponin I: 0.82 ng/mL (ref <0.30)
Troponin I: 1.27 ng/mL (ref <0.30)

## 2011-10-11 LAB — POCT I-STAT, CHEM 8
BUN: 61 mg/dL — ABNORMAL HIGH (ref 6–23)
Calcium, Ion: 1.23 mmol/L (ref 1.13–1.30)
Chloride: 102 mEq/L (ref 96–112)
Creatinine, Ser: 7 mg/dL — ABNORMAL HIGH (ref 0.50–1.10)

## 2011-10-11 LAB — CREATININE, SERUM
GFR calc Af Amer: 15 mL/min — ABNORMAL LOW (ref >90)
GFR calc non Af Amer: 13 mL/min — ABNORMAL LOW (ref >90)

## 2011-10-11 LAB — GLUCOSE, CAPILLARY: Glucose-Capillary: 106 mg/dL — ABNORMAL HIGH (ref 70–99)

## 2011-10-11 MED ORDER — VANCOMYCIN HCL IN DEXTROSE 1-5 GM/200ML-% IV SOLN
1000.0000 mg | Freq: Once | INTRAVENOUS | Status: AC
  Administered 2011-10-11: 1000 mg via INTRAVENOUS
  Filled 2011-10-11: qty 200

## 2011-10-11 MED ORDER — PARICALCITOL 5 MCG/ML IV SOLN
1.0000 ug | INTRAVENOUS | Status: DC
  Administered 2011-10-11: 1 ug via INTRAVENOUS
  Filled 2011-10-11 (×2): qty 0.2

## 2011-10-11 MED ORDER — HEPARIN SODIUM (PORCINE) 5000 UNIT/ML IJ SOLN
5000.0000 [IU] | Freq: Three times a day (TID) | INTRAMUSCULAR | Status: DC
  Filled 2011-10-11 (×2): qty 1

## 2011-10-11 MED ORDER — DOCUSATE SODIUM 100 MG PO CAPS
200.0000 mg | ORAL_CAPSULE | Freq: Every day | ORAL | Status: DC
  Administered 2011-10-11 – 2011-10-20 (×7): 200 mg via ORAL
  Filled 2011-10-11 (×9): qty 2

## 2011-10-11 MED ORDER — EPOETIN ALFA NICU SYRINGE 2000 UNITS/ML: 10000.0000 [IU] | INTRAMUSCULAR | Status: DC

## 2011-10-11 MED ORDER — MOXIFLOXACIN HCL IN NACL 400 MG/250ML IV SOLN
400.0000 mg | Freq: Once | INTRAVENOUS | Status: DC
  Administered 2011-10-11: 400 mg via INTRAVENOUS
  Filled 2011-10-11: qty 250

## 2011-10-11 MED ORDER — SODIUM CHLORIDE 0.9 % IJ SOLN
10.0000 mL | Freq: Two times a day (BID) | INTRAMUSCULAR | Status: DC
  Administered 2011-10-12 – 2011-10-15 (×2): 10 mL

## 2011-10-11 MED ORDER — AMLODIPINE BESYLATE 10 MG PO TABS
10.0000 mg | ORAL_TABLET | Freq: Every day | ORAL | Status: DC
Start: 2011-10-11 — End: 2011-10-21
  Administered 2011-10-12 – 2011-10-21 (×8): 10 mg via ORAL
  Filled 2011-10-11 (×12): qty 1

## 2011-10-11 MED ORDER — IPRATROPIUM BROMIDE 0.02 % IN SOLN
RESPIRATORY_TRACT | Status: AC
  Filled 2011-10-11: qty 2.5

## 2011-10-11 MED ORDER — EPOETIN ALFA 10000 UNIT/ML IJ SOLN
10000.0000 [IU] | INTRAMUSCULAR | Status: DC
  Filled 2011-10-11 (×2): qty 1

## 2011-10-11 MED ORDER — HEPARIN (PORCINE) LOCK FLUSH 10 UNIT/ML IV SOLN
20.0000 [IU] | INTRAVENOUS | Status: DC | PRN
  Filled 2011-10-11: qty 2

## 2011-10-11 MED ORDER — ACETAMINOPHEN 325 MG PO TABS
650.0000 mg | ORAL_TABLET | Freq: Four times a day (QID) | ORAL | Status: DC | PRN
  Administered 2011-10-16: 325 mg via ORAL
  Administered 2011-10-17: 650 mg via ORAL
  Filled 2011-10-11 (×2): qty 2

## 2011-10-11 MED ORDER — L-METHYLFOLATE-B6-B12 3-35-2 MG PO TABS
1.0000 | ORAL_TABLET | Freq: Two times a day (BID) | ORAL | Status: DC
  Administered 2011-10-12 – 2011-10-21 (×15): 1 via ORAL
  Filled 2011-10-11 (×22): qty 1

## 2011-10-11 MED ORDER — LORATADINE 10 MG PO TABS
10.0000 mg | ORAL_TABLET | Freq: Every day | ORAL | Status: DC
  Administered 2011-10-11: 10 mg via ORAL
  Filled 2011-10-11 (×2): qty 1

## 2011-10-11 MED ORDER — PIPERACILLIN-TAZOBACTAM IN DEX 2-0.25 GM/50ML IV SOLN
2.2500 g | Freq: Once | INTRAVENOUS | Status: DC
  Filled 2011-10-11: qty 50

## 2011-10-11 MED ORDER — SODIUM CHLORIDE 0.9 % IJ SOLN
3.0000 mL | Freq: Two times a day (BID) | INTRAMUSCULAR | Status: DC
  Administered 2011-10-12: 23:00:00 via INTRAVENOUS
  Administered 2011-10-15 (×2): 3 mL via INTRAVENOUS

## 2011-10-11 MED ORDER — IPRATROPIUM BROMIDE 0.02 % IN SOLN
0.5000 mg | Freq: Once | RESPIRATORY_TRACT | Status: AC
  Administered 2011-10-11: 0.5 mg via RESPIRATORY_TRACT
  Filled 2011-10-11: qty 2.5

## 2011-10-11 MED ORDER — CALCIUM ACETATE 667 MG PO CAPS
667.0000 mg | ORAL_CAPSULE | Freq: Four times a day (QID) | ORAL | Status: DC
  Administered 2011-10-12 – 2011-10-21 (×28): 667 mg via ORAL
  Filled 2011-10-11 (×45): qty 1

## 2011-10-11 MED ORDER — ALBUTEROL SULFATE (5 MG/ML) 0.5% IN NEBU
2.5000 mg | INHALATION_SOLUTION | Freq: Once | RESPIRATORY_TRACT | Status: AC
  Administered 2011-10-11: 2.5 mg via RESPIRATORY_TRACT
  Filled 2011-10-11: qty 0.5

## 2011-10-11 MED ORDER — HYDRALAZINE HCL 50 MG PO TABS
50.0000 mg | ORAL_TABLET | Freq: Three times a day (TID) | ORAL | Status: DC
  Administered 2011-10-12 – 2011-10-13 (×2): 50 mg via ORAL
  Filled 2011-10-11 (×8): qty 1

## 2011-10-11 MED ORDER — ALBUTEROL SULFATE (5 MG/ML) 0.5% IN NEBU
INHALATION_SOLUTION | RESPIRATORY_TRACT | Status: AC
  Filled 2011-10-11: qty 2

## 2011-10-11 MED ORDER — HEPARIN (PORCINE) LOCK FLUSH 10 UNIT/ML IV SOLN
20.0000 [IU] | Freq: Two times a day (BID) | INTRAVENOUS | Status: DC
  Filled 2011-10-11 (×7): qty 2

## 2011-10-11 MED ORDER — METOPROLOL SUCCINATE ER 100 MG PO TB24
100.0000 mg | ORAL_TABLET | Freq: Two times a day (BID) | ORAL | Status: DC
  Administered 2011-10-11 – 2011-10-21 (×16): 100 mg via ORAL
  Filled 2011-10-11 (×23): qty 1

## 2011-10-11 MED ORDER — ASPIRIN 81 MG PO CHEW: 324.0000 mg | CHEWABLE_TABLET | Freq: Once | ORAL | Status: DC

## 2011-10-11 MED ORDER — LOSARTAN POTASSIUM 50 MG PO TABS
100.0000 mg | ORAL_TABLET | Freq: Every day | ORAL | Status: DC
  Administered 2011-10-12 – 2011-10-20 (×8): 100 mg via ORAL
  Filled 2011-10-11 (×12): qty 2

## 2011-10-11 MED ORDER — CLONIDINE HCL 0.1 MG/24HR TD PTWK
0.1000 mg | MEDICATED_PATCH | TRANSDERMAL | Status: DC
  Filled 2011-10-11: qty 1

## 2011-10-11 MED ORDER — PARICALCITOL 5 MCG/ML IV SOLN
INTRAVENOUS | Status: AC
  Administered 2011-10-11: 1 ug via INTRAVENOUS
  Filled 2011-10-11: qty 1

## 2011-10-11 MED ORDER — RENA-VITE PO TABS
1.0000 | ORAL_TABLET | Freq: Every day | ORAL | Status: DC
  Administered 2011-10-11 – 2011-10-21 (×9): 1 via ORAL
  Filled 2011-10-11 (×12): qty 1

## 2011-10-11 MED ORDER — TRAMADOL HCL 50 MG PO TABS
50.0000 mg | ORAL_TABLET | Freq: Four times a day (QID) | ORAL | Status: DC | PRN
  Filled 2011-10-11: qty 1

## 2011-10-11 MED ORDER — ALLOPURINOL 100 MG PO TABS
100.0000 mg | ORAL_TABLET | Freq: Every day | ORAL | Status: DC
  Filled 2011-10-11 (×2): qty 1

## 2011-10-11 MED ORDER — HEPARIN SODIUM (PORCINE) 1000 UNIT/ML DIALYSIS: 20.0000 [IU]/kg | INTRAMUSCULAR | Status: DC | PRN

## 2011-10-11 MED ORDER — INSULIN ASPART 100 UNIT/ML ~~LOC~~ SOLN: 0.0000 [IU] | Freq: Three times a day (TID) | SUBCUTANEOUS | Status: DC

## 2011-10-11 MED ORDER — INSULIN ASPART 100 UNIT/ML ~~LOC~~ SOLN: 0.0000 [IU] | Freq: Every day | SUBCUTANEOUS | Status: DC

## 2011-10-11 MED ORDER — CYCLOBENZAPRINE HCL 10 MG PO TABS
10.0000 mg | ORAL_TABLET | Freq: Two times a day (BID) | ORAL | Status: DC | PRN
  Administered 2011-10-11: 10 mg via ORAL
  Filled 2011-10-11: qty 1

## 2011-10-11 MED ORDER — SODIUM CHLORIDE 0.9 % IJ SOLN
10.0000 mL | INTRAMUSCULAR | Status: DC | PRN
  Administered 2011-10-11: 10 mL

## 2011-10-11 NOTE — ED Notes (Signed)
Pt refuses blood draw or IV; nephrology to be consulted about accessing hemodialysis cath

## 2011-10-11 NOTE — Consult Note (Signed)
Pt. In ER. PT. With rales and wheezing. Probably CHF. And early Pn. Or bronchitis. Dialysis orders written.

## 2011-10-11 NOTE — ED Provider Notes (Addendum)
History     CSN: [id number]  Arrival date & time 10/11/11  7829   First MD Initiated Contact with Patient 10/11/11 0730      Chief Complaint  Patient presents with  . Shortness of Breath    (Consider location/radiation/quality/duration/timing/severity/associated sxs/prior treatment) Patient is a 66 y.o. female presenting with shortness of breath. The history is provided by the patient.  Shortness of Breath  The current episode started today. The onset was sudden. The problem occurs continuously. The problem has been unchanged. The problem is severe. Nothing relieves the symptoms. The symptoms are aggravated by activity and a supine position. Associated symptoms include cough, shortness of breath and wheezing. Pertinent negatives include no chest pain and no chest pressure. The cough is non-productive. She has had no prior steroid use. Her past medical history is significant for past wheezing. Her past medical history does not include asthma or bronchiolitis. Urine output has been absent. There were no sick contacts. Recent Medical Care: Dialysis on Saturday.    Past Medical History  Diagnosis Date  . Chronic kidney disease     esrd  . Diabetes mellitus   . Hyperlipidemia   . Hypertension   . Renal disorder   . Dialysis care     tues, thurs, sat  . 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 Carotid endarterectomy 2002    left  . Btl   . Rectal fissurectomy   . Av fistula placement 10/30/2010    right brachiocephalic   . Fistulogram     Family History  Problem Relation Age of Onset  . COPD Mother   . Kidney disease Mother   . Heart disease Father   . Lymphoma Son     History  Substance Use Topics  . Smoking status: Current Everyday Smoker -- 1.0 packs/day for 50 years    Types: Cigarettes  . Smokeless tobacco: Never Used  . Alcohol Use: No    OB History    Grav Para Term Preterm Abortions TAB SAB Ect Mult Living                  Review of Systems  Respiratory: Positive for cough, shortness of breath and wheezing.   Cardiovascular: Negative for chest pain.  All other systems reviewed and are negative.    Allergies  Codeine; Epinephrine; and Percocet  Home Medications   Current Outpatient Rx  Name Route Sig Dispense Refill  . ALLOPURINOL 100 MG PO TABS Oral Take 100 mg by mouth daily.      Marland Kitchen AMLODIPINE BESYLATE 5 MG PO TABS Oral Take 10 mg by mouth daily.    Marland Kitchen CALCIUM ACETATE 667 MG PO CAPS Oral Take 667 mg by mouth 4 (four) times daily.     Marland Kitchen CLONIDINE HCL 0.1 MG/24HR TD PTWK Transdermal Place 1 patch onto the skin once a week. Changes on friday    . COENZYME Q10 150 MG PO CAPS Oral Take 300 mg by mouth daily. Hold while in hospital    . DOCUSATE SODIUM 100 MG PO CAPS Oral Take 200 mg by mouth at bedtime.      Marland Kitchen HYDRALAZINE HCL 25 MG PO TABS Oral Take 50 mg by mouth 3 (three) times daily.     . L-METHYLFOLATE-B6-B12 3-35-2 MG PO TABS Oral Take 1 tablet by mouth 2 (two) times daily.     Marland Kitchen LOSARTAN POTASSIUM 50 MG PO TABS Oral Take 100 mg  by mouth at bedtime.     Marland Kitchen METOPROLOL SUCCINATE ER 50 MG PO TB24 Oral Take 100 mg by mouth 2 (two) times daily. Takes after dialysis (T, Th, Sat)    . RENA-VITE PO TABS Oral Take 1 tablet by mouth daily.      Marland Kitchen REPAGLINIDE 0.5 MG PO TABS Oral Take 0.5 mg by mouth 3 (three) times daily before meals.       SpO2 100%  Physical Exam  Nursing note and vitals reviewed. Constitutional: She is oriented to person, place, and time. She appears well-developed and well-nourished. No distress.  HENT:  Head: Normocephalic and atraumatic.  Mouth/Throat: Oropharynx is clear and moist.  Eyes: Conjunctivae and EOM are normal. Pupils are equal, round, and reactive to light.  Neck: Normal range of motion. Neck supple.  Cardiovascular: Normal rate, regular rhythm and intact distal pulses.   No murmur heard. Pulmonary/Chest: Accessory muscle usage present. Tachypnea noted. She has  decreased breath sounds. She has wheezes. She has rales.       Only able to speak in one to 2 word statements at a time  Abdominal: Soft. She exhibits no distension. There is no tenderness. There is no rebound and no guarding.  Musculoskeletal: Normal range of motion. She exhibits no edema and no tenderness.       2-3+ pitting edema in the lower extremities in the right upper extremity  Neurological: She is alert and oriented to person, place, and time.  Skin: Skin is warm and dry. No rash noted. No erythema.  Psychiatric: She has a normal mood and affect. Her behavior is normal.    ED Course  Procedures (including critical care time)  Labs Reviewed  CBC WITH DIFFERENTIAL - Abnormal; Notable for the following:    WBC 12.7 (*)     Hemoglobin 9.4 (*)     HCT 30.3 (*)     MCV 74.8 (*)     MCH 23.2 (*)     RDW 22.9 (*)     All other components within normal limits  GLUCOSE, CAPILLARY - Abnormal; Notable for the following:    Glucose-Capillary 197 (*)     All other components within normal limits  POCT I-STAT, CHEM 8 - Abnormal; Notable for the following:    BUN 61 (*)     Creatinine, Ser 7.00 (*)     Glucose, Bld 197 (*)     Hemoglobin 11.2 (*)     HCT 33.0 (*)     All other components within normal limits  BASIC METABOLIC PANEL  CARDIAC PANEL(CRET KIN+CKTOT+MB+TROPI)  LACTIC ACID, PLASMA   Dg Chest Portable 1 View  10/11/2011  *RADIOLOGY REPORT*  Clinical Data: Shortness of breath  PORTABLE CHEST - 1 VIEW  Comparison: 07/12/2011  Findings: Mild patchy bilateral lower lobe opacities, atelectasis versus pneumonia.  No frank interstitial edema.  No pneumothorax.  The heart is top normal in size.  Stable right IJ dual lumen dialysis catheter.  IMPRESSION: Mild patchy bilateral lower lobe opacities, atelectasis versus pneumonia.  Original Report Authenticated By: Charline Bills, M.D.     Date: 10/11/2011  Rate: 68  Rhythm: normal sinus rhythm  QRS Axis: normal  Intervals:  normal  ST/T Wave abnormalities: nonspecific ST/T changes  Conduction Disutrbances:Left ventricular hypertrophy  Narrative Interpretation:   Old EKG Reviewed: unchanged   1. Flash pulmonary edema   2. HCAP (healthcare-associated pneumonia)     CRITICAL CARE Performed by: Gwyneth Sprout   Total critical care time: 30  Critical care time was exclusive of separately billable procedures and treating other patients.  Critical care was necessary to treat or prevent imminent or life-threatening deterioration.  Critical care was time spent personally by me on the following activities: development of treatment plan with patient and/or surrogate as well as nursing, discussions with consultants, evaluation of patient's response to treatment, examination of patient, obtaining history from patient or surrogate, ordering and performing treatments and interventions, ordering and review of laboratory studies, ordering and review of radiographic studies, pulse oximetry and re-evaluation of patient's condition.   MDM   Patient with a history of dialysis he states that she last dialyzed on Saturday presented today with abrupt shortness of breath that started at 3 AM. Patient is wheezing diffusely on exam with accessory muscle use and tachypnea. She has diffuse crackles and evidence of fluid overload on exam.  Patient denies chest pain, nausea or vomiting.  States that which she recently had duplex done of her right C. which was negative for DVT despite the swelling. She states that she was recently started 3 weeks ago on a medication that had a side effect of edema. She denies any infectious symptoms and states she felt fine until 3 AM.  BiPAP and portable x-ray ordered. Will speak to her nephrologist for port access for blood and also for immediate dialysis.  9:36 AM Chest x-ray with patchy infiltrates which is most likely flash pulmonary edema however given the elevated white count today of 12.7 and  stable renal markers will also add antibiotics for possible pneumonia. Patient has improved on BiPAP and is comfortably breathing now.  Started on avelox.  Also CE were elevated with troponin of .83 without change on EKG however unclear what her markers are regularly and most likely type 2 from strain.  Pt given an aspirin.  But denies any chest pain.      Gwyneth Sprout, MD 10/11/11 1610  Gwyneth Sprout, MD 10/11/11 1004  Gwyneth Sprout, MD 10/11/11 1030

## 2011-10-11 NOTE — H&P (Addendum)
Hospital Admission Note Date: 10/11/2011  Patient name: Alexandra [name] Medical record number: [id number] Date of birth: 1946/01/22 Age: 66 y.o. Gender: female PCP: [name], MD  Attending physician: Altha Harm, MD  Chief Complaint:Acute respiratory failure  History of Present Illness:Alexandra [name] is an 66 y/o female with ESRD on HD who presents with sudden onset SOB during the night (approximately 3:00am). Patient states that prior to this she had no complaints was feeling well prior to going to bed last night. The patient says she's been compliant with her dialysis.  She denies CP, Chills, cough, dizziness, nausea, vomiting, diarrhea.  In the emergency room the patient was resuscitated with BiPAP until she can receive dialysis. She did receive a dose of vancomycin and Zosyn as she has a mildly elevated white blood cell count and difficulty with breathing with chest x-ray that could represent either atelectasis or consolidation. Rested with the patient for further evaluation and treatment. Please note that the patient is dialyzed on Tuesdays, Thursdays and Saturdays.  Scheduled Meds:   . albuterol      . aspirin  324 mg Oral Once  . ipratropium      . piperacillin-tazobactam (ZOSYN)  IV  2.25 g Intravenous Once  . vancomycin  1,000 mg Intravenous Once  . DISCONTD: moxifloxacin  400 mg Intravenous Once   Continuous Infusions:  PRN Meds:. Allergies: Codeine; Epinephrine; and Percocet Past Medical History  Diagnosis Date  . Chronic kidney disease     esrd  . Diabetes mellitus   . Hyperlipidemia   . Hypertension   . Renal disorder   . Dialysis care     tues, thurs, sat  . Gout due to renal impairment    Past Surgical History  Procedure Date  . Carotid endarterectomy 2002    left  . Btl   . Rectal fissurectomy   . Av fistula placement 10/30/2010    right brachiocephalic   . Fistulogram    Family History  Problem Relation Age of Onset  . COPD  Mother   . Kidney disease Mother   . Heart disease Father   . Lymphoma Son    History   Social History  . Marital Status: Single    Spouse Name: N/A    Number of Children: N/A  . Years of Education: N/A   Occupational History  . Not on file.   Social History Main Topics  . Smoking status: Current Everyday Smoker -- 1.0 packs/day for 50 years    Types: Cigarettes  . Smokeless tobacco: Never Used  . Alcohol Use: No  . Drug Use: No  . Sexually Active: Not on file   Other Topics Concern  . Not on file   Social History Narrative  . No narrative on file   Review of Systems: A comprehensive review of systems was negative except as noted in history of present illness Physical Exam: No intake or output data in the 24 hours ending 10/11/11 1140 General: Alert, awake, oriented x3. She's presently on BiPAP but is nontoxic appearing. HEENT: [place]/AT PEERL, EOMI Neck: Trachea midline,  no masses, no thyromegal,y no JVD, no carotid bruit OROPHARYNX:  Moist, No exudate/ erythema/lesions.  Heart: Regular rate and rhythm, without murmurs, rubs, gallops. Lungs: Basilar rails and mild diffuse wheezing. Abdomen: Soft, nontender, nondistended, positive bowel sounds, no masses no hepatosplenomegaly noted..  Neuro: No focal neurological deficits noted cranial nerves II through XII grossly intact. DTRs 2+ bilaterally upper and lower extremities. Strength functional in  bilateral upper and lower extremities. Musculoskeletal: No warm swelling or erythema around joints, no spinal tenderness noted. Psychiatric: Patient alert and oriented x3, good insight and cognition, good recent to remote recall.   Lab results:  Basename 10/11/11 0903 10/11/11 0845 10/08/11 2221  NA 136 137 --  K 4.8 4.9 --  CL 102 96 --  CO2 -- 25 28  GLUCOSE 197* 203* --  BUN 61* 62* --  CREATININE 7.00* 7.65* --  CALCIUM -- 9.8 9.1  MG -- -- --  PHOS -- -- 3.4    Basename 10/08/11 2221  AST --  ALT --  ALKPHOS --    BILITOT --  PROT --  ALBUMIN 3.3*   No results found for this basename: LIPASE:2,AMYLASE:2 in the last 72 hours  Basename 10/11/11 0903 10/11/11 0845 10/08/11 2221  WBC -- 12.7* 5.9  NEUTROABS -- 11.7* 2.9  HGB 11.2* 9.4* --  HCT 33.0* 30.3* --  MCV -- 74.8* 73.8*  PLT -- 179 161    Basename 10/11/11 0845  CKTOTAL 263*  CKMB 9.6*  CKMBINDEX --  TROPONINI 0.82*   No components found with this basename: POCBNP:3 No results found for this basename: DDIMER:2 in the last 72 hours No results found for this basename: HGBA1C:2 in the last 72 hours No results found for this basename: CHOL:2,HDL:2,LDLCALC:2,TRIG:2,CHOLHDL:2,LDLDIRECT:2 in the last 72 hours No results found for this basename: TSH,T4TOTAL,FREET3,T3FREE,THYROIDAB in the last 72 hours No results found for this basename: VITAMINB12:2,FOLATE:2,FERRITIN:2,TIBC:2,IRON:2,RETICCTPCT:2 in the last 72 hours Imaging results:  Dg Chest Portable 1 View  10/11/2011  *RADIOLOGY REPORT*  Clinical Data: Shortness of breath  PORTABLE CHEST - 1 VIEW  Comparison: 07/12/2011  Findings: Mild patchy bilateral lower lobe opacities, atelectasis versus pneumonia.  No frank interstitial edema.  No pneumothorax.  The heart is top normal in size.  Stable right IJ dual lumen dialysis catheter.  IMPRESSION: Mild patchy bilateral lower lobe opacities, atelectasis versus pneumonia.  Original Report Authenticated By: Charline Bills, M.D.   Other results: Patient Active Hospital Problem List: EKG:  nonspecific ST and T waves changes. Acute respiratory failure (10/11/2011)   Assessment: Respiratory failure is likely due to to pulmonary edema. I reviewed the x-rays with radiology and the findings in the context of the clinical picture is more consistent with pulmonary edema with bilateral basilar atelectasis.    Plan: The patient will continue on BiPAP as she goes to dialysis. At this time I will discontinue antibiotics. If the patient still is requiring  oxygen supplementation after dialysis I will pursue a d-dimer on this patient. The patient has a mildly elevated troponins and I will cycle her enzymes although I suspect her troponins are elevated secondary to her renal failure. However given her sudden onset of SOB I will continue to cycle her enzymes to ensure to progression of troponins.  ESRD on hemodialysis (08/02/2011)   Assessment: She dialysis today. Dr. Bascom Levels has been consulted.     Diabetes mellitus with end stage renal disease (08/02/2011)   Assessment: Her usual medications additionally I will and a sliding scale insulin regimen for correction dose.     Leukocytosis (10/11/2011)   Assessment: Certainly be reactive owing to the patient's stated stress as there is no evidence of infection. We will recheck her CBC in the morning.     Time spent in the initial care of this patient approximately 40 minutes  MATTHEWS,MICHELLE A. 10/11/2011, 11:40 AM

## 2011-10-11 NOTE — ED Notes (Signed)
Per EMS: pt from home c/o increased SOB starting at 0300; pt only speaking 1 word phrases upon EMS arrival; pt given 10mg  albuterol and 0.5mg  atrovent; pt seen here 3 days a week for dialysis; pt due tonight for dialysis; pt with wheezing noted at present; pt sts some right sided weakness that is now resolved; right hand swollen at present

## 2011-10-11 NOTE — ED Notes (Signed)
Labs reported to EDP

## 2011-10-11 NOTE — Progress Notes (Signed)
Pt admitted to the unit from HD. Pt is alert and oriented. Pt oriented to room, staff, and call bell. Bed in lowest position. Full assessment to Epic. Call bell with in reach. Told to call for assists. Will continue to monitor. Burley Saver, RN

## 2011-10-11 NOTE — Progress Notes (Addendum)
Hemodialysis-See flowsheet BIPAP removed at 1515, pt states she feels "much better" and denies sob. Sats 97-100% on RA. Will monitor closely.  Epogen held d/t hgb result 11.2  Tx ended 20 minutes early d/t severe cramping unresolved by saline. Pt states relief at 1430. Dr. Bascom Levels aware of issue.   Dr. Ashley Royalty paged post HD for update on condition. DC IV abx and transfer to 6700 instead of stepdown.

## 2011-10-11 NOTE — Progress Notes (Signed)
Pt. Arrived 0730 hrs. on 10 mg Albuterol/0.5 mg Atrovent nebulizer tx., Hx. RD, b/l rales with audible wheeze noted moderate WOB placed on BiPAP 16/8 30% continued neb, tolerating well, RT to monitor.

## 2011-10-11 NOTE — ED Provider Notes (Signed)
History     CSN: [id number]  Arrival date & time 10/08/11  2138   First MD Initiated Contact with Patient 10/08/11 2203      Chief Complaint  Patient presents with  . Vascular Access Problem     HPI Pt here for dialysis. Dr. Bascom Levels at triage to assess pt.  Past Medical History  Diagnosis Date  . Chronic kidney disease     esrd  . Diabetes mellitus   . Hyperlipidemia   . Hypertension   . Renal disorder   . Dialysis care     tues, thurs, sat  . 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 Carotid endarterectomy 2002    left  . Btl   . Rectal fissurectomy   . Av fistula placement 10/30/2010    right brachiocephalic   . Fistulogram     Family History  Problem Relation Age of Onset  . COPD Mother   . Kidney disease Mother   . Heart disease Father   . Lymphoma Son     History  Substance Use Topics  . Smoking status: Current Everyday Smoker -- 1.0 packs/day for 50 years    Types: Cigarettes  . Smokeless tobacco: Never Used  . Alcohol Use: No    OB History    Grav Para Term Preterm Abortions TAB SAB Ect Mult Living                  Review of Systems  All other systems reviewed and are negative.    Allergies  Codeine; Epinephrine; and Percocet  Home Medications   Current Outpatient Rx  Name Route Sig Dispense Refill  . ALLOPURINOL 100 MG PO TABS Oral Take 100 mg by mouth daily.      Marland Kitchen AMLODIPINE BESYLATE 5 MG PO TABS Oral Take 10 mg by mouth daily.    Marland Kitchen CALCIUM ACETATE 667 MG PO CAPS Oral Take 667 mg by mouth 4 (four) times daily.     Marland Kitchen CLONIDINE HCL 0.1 MG/24HR TD PTWK Transdermal Place 1 patch onto the skin once a week. Changes on friday    . COENZYME Q10 150 MG PO CAPS Oral Take 300 mg by mouth daily. Hold while in hospital    . DOCUSATE SODIUM 100 MG PO CAPS Oral Take 200 mg by mouth at bedtime.      Marland Kitchen HYDRALAZINE HCL 25 MG PO TABS Oral Take 50 mg by mouth 3 (three) times daily.     . L-METHYLFOLATE-B6-B12  3-35-2 MG PO TABS Oral Take 1 tablet by mouth 2 (two) times daily.     Marland Kitchen LOSARTAN POTASSIUM 50 MG PO TABS Oral Take 100 mg by mouth at bedtime.     Marland Kitchen METOPROLOL SUCCINATE ER 50 MG PO TB24 Oral Take 100 mg by mouth 2 (two) times daily. Takes after dialysis (T, Th, Sat)    . RENA-VITE PO TABS Oral Take 1 tablet by mouth daily.      Marland Kitchen REPAGLINIDE 0.5 MG PO TABS Oral Take 0.5 mg by mouth 3 (three) times daily before meals.       BP 204/67  Pulse 83  Temp 97.6 F (36.4 C) (Oral)  Resp 16  Wt 129 lb (58.514 kg)  SpO2 96%  Physical Exam  Nursing note and vitals reviewed. Constitutional: She is oriented to person, place, and time. She appears well-developed. No distress.  HENT:  Head: Normocephalic and atraumatic.  Eyes: Pupils are  equal, round, and reactive to light.  Neck: Normal range of motion.  Cardiovascular: Normal rate and intact distal pulses.   Pulmonary/Chest: No respiratory distress.  Abdominal: Normal appearance. She exhibits no distension.  Musculoskeletal: Normal range of motion.  Neurological: She is alert and oriented to person, place, and time. No cranial nerve deficit.  Skin: Skin is warm and dry. No rash noted.  Psychiatric: She has a normal mood and affect. Her behavior is normal.    ED Course  Procedures (including critical care time)  Labs Reviewed  CBC WITH DIFFERENTIAL - Abnormal; Notable for the following:    RBC 3.67 (*)     Hemoglobin 8.7 (*)     HCT 27.1 (*)     MCV 73.8 (*)     MCH 23.7 (*)     RDW 22.8 (*)     Monocytes Relative 13 (*)     All other components within normal limits  RENAL FUNCTION PANEL - Abnormal; Notable for the following:    Glucose, Bld 208 (*)     BUN 53 (*)     Creatinine, Ser 7.11 (*)     Albumin 3.3 (*)     GFR calc non Af Amer 5 (*)     GFR calc Af Amer 6 (*)     All other components within normal limits  LAB REPORT - SCANNED   No results found.   1. ESRD on hemodialysis   2. Anemia of chronic disease   3.  Diabetes mellitus       MDM          Nelia Shi, MD 10/11/11 [phone]

## 2011-10-11 NOTE — ED Notes (Signed)
Pt's CBG was 197 when I checked it.8:14am JG.

## 2011-10-12 ENCOUNTER — Inpatient Hospital Stay (HOSPITAL_COMMUNITY): Payer: Medicare HMO

## 2011-10-12 ENCOUNTER — Encounter (HOSPITAL_COMMUNITY): Payer: Self-pay | Admitting: General Practice

## 2011-10-12 ENCOUNTER — Other Ambulatory Visit: Payer: Self-pay

## 2011-10-12 DIAGNOSIS — J810 Acute pulmonary edema: Secondary | ICD-10-CM

## 2011-10-12 DIAGNOSIS — N186 End stage renal disease: Secondary | ICD-10-CM

## 2011-10-12 DIAGNOSIS — N039 Chronic nephritic syndrome with unspecified morphologic changes: Secondary | ICD-10-CM

## 2011-10-12 DIAGNOSIS — Z992 Dependence on renal dialysis: Secondary | ICD-10-CM

## 2011-10-12 DIAGNOSIS — J189 Pneumonia, unspecified organism: Secondary | ICD-10-CM

## 2011-10-12 DIAGNOSIS — J9600 Acute respiratory failure, unspecified whether with hypoxia or hypercapnia: Principal | ICD-10-CM

## 2011-10-12 DIAGNOSIS — E1129 Type 2 diabetes mellitus with other diabetic kidney complication: Secondary | ICD-10-CM

## 2011-10-12 DIAGNOSIS — R7989 Other specified abnormal findings of blood chemistry: Secondary | ICD-10-CM

## 2011-10-12 DIAGNOSIS — M7989 Other specified soft tissue disorders: Secondary | ICD-10-CM

## 2011-10-12 LAB — CARDIAC PANEL(CRET KIN+CKTOT+MB+TROPI)
Relative Index: 2.2 (ref 0.0–2.5)
Troponin I: 0.68 ng/mL (ref <0.30)

## 2011-10-12 LAB — POCT I-STAT 3, ART BLOOD GAS (G3+)
Patient temperature: 98.6
pH, Arterial: 7.342 — ABNORMAL LOW (ref 7.350–7.450)

## 2011-10-12 LAB — CBC
HCT: 25.6 % — ABNORMAL LOW (ref 36.0–46.0)
Hemoglobin: 8.1 g/dL — ABNORMAL LOW (ref 12.0–15.0)
MCH: 23.6 pg — ABNORMAL LOW (ref 26.0–34.0)
MCHC: 31.6 g/dL (ref 30.0–36.0)

## 2011-10-12 LAB — RENAL FUNCTION PANEL
BUN: 33 mg/dL — ABNORMAL HIGH (ref 6–23)
CO2: 34 mEq/L — ABNORMAL HIGH (ref 19–32)
Glucose, Bld: 85 mg/dL (ref 70–99)
Phosphorus: 3.8 mg/dL (ref 2.3–4.6)
Potassium: 3.8 mEq/L (ref 3.5–5.1)

## 2011-10-12 LAB — GLUCOSE, CAPILLARY
Glucose-Capillary: 137 mg/dL — ABNORMAL HIGH (ref 70–99)
Glucose-Capillary: 48 mg/dL — ABNORMAL LOW (ref 70–99)
Glucose-Capillary: 74 mg/dL (ref 70–99)

## 2011-10-12 LAB — PROCALCITONIN: Procalcitonin: 23.02 ng/mL

## 2011-10-12 LAB — HEPARIN LEVEL (UNFRACTIONATED): Heparin Unfractionated: 0.61 IU/mL (ref 0.30–0.70)

## 2011-10-12 MED ORDER — PANTOPRAZOLE SODIUM 40 MG IV SOLR
40.0000 mg | Freq: Every day | INTRAVENOUS | Status: DC
  Administered 2011-10-12 – 2011-10-13 (×2): 40 mg via INTRAVENOUS
  Filled 2011-10-12 (×5): qty 40

## 2011-10-12 MED ORDER — MIDAZOLAM HCL 2 MG/2ML IJ SOLN
INTRAMUSCULAR | Status: AC
  Administered 2011-10-12: 2 mg
  Filled 2011-10-12: qty 4

## 2011-10-12 MED ORDER — CHLORHEXIDINE GLUCONATE 0.12 % MT SOLN: 15.0000 mL | Freq: Two times a day (BID) | OROMUCOSAL | Status: DC

## 2011-10-12 MED ORDER — CHLORHEXIDINE GLUCONATE 0.12 % MT SOLN
15.0000 mL | Freq: Two times a day (BID) | OROMUCOSAL | Status: DC
  Administered 2011-10-12 – 2011-10-20 (×13): 15 mL via OROMUCOSAL
  Filled 2011-10-12 (×17): qty 15

## 2011-10-12 MED ORDER — NEPRO/CARBSTEADY PO LIQD
1000.0000 mL | ORAL | Status: DC
  Filled 2011-10-12: qty 1000

## 2011-10-12 MED ORDER — BIOTENE DRY MOUTH MT LIQD: 1.0000 "application " | Freq: Four times a day (QID) | OROMUCOSAL | Status: DC

## 2011-10-12 MED ORDER — FENTANYL CITRATE 0.05 MG/ML IJ SOLN
INTRAMUSCULAR | Status: AC
  Administered 2011-10-12: 100 ug
  Filled 2011-10-12: qty 4

## 2011-10-12 MED ORDER — HEPARIN BOLUS VIA INFUSION
3500.0000 [IU] | Freq: Once | INTRAVENOUS | Status: AC
  Administered 2011-10-12: 3500 [IU] via INTRAVENOUS
  Filled 2011-10-12: qty 3500

## 2011-10-12 MED ORDER — HYDRALAZINE HCL 20 MG/ML IJ SOLN
10.0000 mg | Freq: Once | INTRAMUSCULAR | Status: AC
  Administered 2011-10-12: 10 mg via INTRAVENOUS
  Filled 2011-10-12: qty 0.5

## 2011-10-12 MED ORDER — PIPERACILLIN-TAZOBACTAM IN DEX 2-0.25 GM/50ML IV SOLN
2.2500 g | Freq: Three times a day (TID) | INTRAVENOUS | Status: DC
  Administered 2011-10-12 – 2011-10-14 (×5): 2.25 g via INTRAVENOUS
  Filled 2011-10-12 (×8): qty 50

## 2011-10-12 MED ORDER — PROPOFOL 10 MG/ML IV EMUL
5.0000 ug/kg/min | INTRAVENOUS | Status: DC
  Administered 2011-10-12: 70 ug/kg/min via INTRAVENOUS
  Administered 2011-10-12: 25 ug/kg/min via INTRAVENOUS
  Administered 2011-10-12 – 2011-10-13 (×2): 50 ug/kg/min via INTRAVENOUS
  Administered 2011-10-13: 20 ug/kg/min via INTRAVENOUS
  Administered 2011-10-13: 60 ug/kg/min via INTRAVENOUS
  Administered 2011-10-14: 20 ug/kg/min via INTRAVENOUS
  Filled 2011-10-12 (×8): qty 100

## 2011-10-12 MED ORDER — BIOTENE DRY MOUTH MT LIQD
15.0000 mL | Freq: Four times a day (QID) | OROMUCOSAL | Status: DC
  Administered 2011-10-12 – 2011-10-20 (×17): 15 mL via OROMUCOSAL

## 2011-10-12 MED ORDER — IPRATROPIUM-ALBUTEROL 18-103 MCG/ACT IN AERO: 6.0000 | INHALATION_SPRAY | RESPIRATORY_TRACT | Status: DC | PRN

## 2011-10-12 MED ORDER — PROPOFOL 10 MG/ML IV EMUL
INTRAVENOUS | Status: AC
  Filled 2011-10-12: qty 100

## 2011-10-12 MED ORDER — INSULIN ASPART 100 UNIT/ML ~~LOC~~ SOLN
0.0000 [IU] | SUBCUTANEOUS | Status: DC
  Administered 2011-10-12: 2 [IU] via SUBCUTANEOUS

## 2011-10-12 MED ORDER — HEPARIN SODIUM (PORCINE) 1000 UNIT/ML DIALYSIS
20.0000 [IU]/kg | INTRAMUSCULAR | Status: DC | PRN
  Administered 2011-10-12: 4200 [IU] via INTRAVENOUS_CENTRAL
  Filled 2011-10-12: qty 2

## 2011-10-12 MED ORDER — NEPRO/CARBSTEADY PO LIQD
1000.0000 mL | ORAL | Status: DC
  Administered 2011-10-12: 1000 mL
  Filled 2011-10-12 (×2): qty 1000

## 2011-10-12 MED ORDER — PIPERACILLIN-TAZOBACTAM 3.375 G IVPB 30 MIN
3.3750 g | Freq: Three times a day (TID) | INTRAVENOUS | Status: DC
  Administered 2011-10-12: 3.375 g via INTRAVENOUS
  Filled 2011-10-12 (×3): qty 50

## 2011-10-12 MED ORDER — HYDRALAZINE HCL 20 MG/ML IJ SOLN
10.0000 mg | INTRAMUSCULAR | Status: DC | PRN
  Administered 2011-10-14: 10 mg via INTRAVENOUS
  Filled 2011-10-12 (×2): qty 0.5

## 2011-10-12 MED ORDER — ETOMIDATE 2 MG/ML IV SOLN
INTRAVENOUS | Status: AC
  Administered 2011-10-12: 20 mg
  Filled 2011-10-12: qty 10

## 2011-10-12 MED ORDER — SODIUM CHLORIDE 0.9 % IV SOLN
INTRAVENOUS | Status: DC
  Administered 2011-10-12: 04:00:00 via INTRAVENOUS

## 2011-10-12 MED ORDER — VANCOMYCIN HCL IN DEXTROSE 1-5 GM/200ML-% IV SOLN
1000.0000 mg | Freq: Once | INTRAVENOUS | Status: AC
  Administered 2011-10-12: 1000 mg via INTRAVENOUS
  Filled 2011-10-12: qty 200

## 2011-10-12 MED ORDER — ALBUMIN HUMAN 25 % IV SOLN
25.0000 g | Freq: Once | INTRAVENOUS | Status: AC
  Administered 2011-10-12: 25 g via INTRAVENOUS
  Filled 2011-10-12: qty 100

## 2011-10-12 MED ORDER — IPRATROPIUM-ALBUTEROL 18-103 MCG/ACT IN AERO
6.0000 | INHALATION_SPRAY | RESPIRATORY_TRACT | Status: DC
  Administered 2011-10-12 – 2011-10-16 (×20): 6 via RESPIRATORY_TRACT
  Filled 2011-10-12: qty 14.7

## 2011-10-12 MED ORDER — MORPHINE SULFATE 4 MG/ML IJ SOLN: 4.0000 mg | INTRAMUSCULAR | Status: DC | PRN

## 2011-10-12 MED ORDER — HEPARIN (PORCINE) IN NACL 100-0.45 UNIT/ML-% IJ SOLN
850.0000 [IU]/h | INTRAMUSCULAR | Status: DC
  Administered 2011-10-12: 700 [IU]/h via INTRAVENOUS
  Administered 2011-10-13: 850 [IU]/h via INTRAVENOUS
  Filled 2011-10-12 (×4): qty 250

## 2011-10-12 MED ORDER — ALBUMIN HUMAN 25 % IV SOLN
12.5000 g | Freq: Once | INTRAVENOUS | Status: AC
  Administered 2011-10-12: 12.5 g via INTRAVENOUS

## 2011-10-12 NOTE — Consult Note (Signed)
Name: Alexandra [name] MRN: [id number] DOB: 12/04/1945    LOS: 1  Referring Provider:  [facility] Reason for Referral:  Acute respiratory failure  PULMONARY / CRITICAL CARE MEDICINE  Brief patient description:  66 yo ESRD on HD admitted on 7/29 with dyspnea and transferred to ICU due to acute respiratory distress requiring intubation.  In ED:  BiPAP started, given Vancomycin / Zosyn.  Emergent HD upon admission.  Troponin elevated.  Events Since Admission: 7/29  Admitted for SOB, BiPAP started, Vancomycin / Zosyn given, emergent HD 7/30  Transferred to 2300 with respiratory distress, hypertensive, intubated  Current Status: Remains HTN, on ETT  Vital Signs: Temp:  [94.5 F (34.7 C)-97.9 F (36.6 C)] 97.8 F (36.6 C) (07/30 0744) Pulse Rate:  [61-100] 85  (07/30 0700) Resp:  [12-35] 19  (07/30 0700) BP: (138-222)/(52-154) 164/52 mmHg (07/30 0700) SpO2:  [74 %-100 %] 100 % (07/30 0700) FiO2 (%):  [40 %-80 %] 40 % (07/30 0601) Weight:  [58.5 kg (128 lb 15.5 oz)-58.6 kg (129 lb 3 oz)] 58.6 kg (129 lb 3 oz) (07/30 0600)  Physical Examination: General: Synchronous, rass -2 Neuro:  Sedated, rass -2 HEENT:  PERRL 2mm Neck:  JVD Cardiovascular:  RRR, tachycardia resolved  Lungs:  ronchi bilat Abdomen:  Soft, nontender, nondistended, bowel sounds present Musculoskeletal:  Moves all extremities Skin:  Intact  Active Problems:  Hemodialysis patient  Hypertensive CKD, ESRD on dialysis  ESRD on hemodialysis  Diabetes mellitus with end stage renal disease  Acute respiratory failure  Leukocytosis  Troponin level elevated  ASSESSMENT AND PLAN  PULMONARY  Lab 10/12/11 0450  PHART 7.342*  PCO2ART 65.8*  PO2ART 171.0*  HCO3 35.7*  O2SAT 99.0    Ventilator Settings: Vent Mode:  [-] PRVC FiO2 (%):  [40 %-80 %] 40 % Set Rate:  [12 bmp] 12 bmp Vt Set:  [400 mL] 400 mL PEEP:  [5 cmH20] 5 cmH20  CXR:  7/30 >>> Well positioned hardware, bilateral airspace disease ETT:  7/30  >>>  A:  Acute respiratory failure. Possible HCAP.  Possible aspiration.  Possible acute pulmonary edema secondary to congestive heart failure, hypertensive emergency, fluid overload.  P:   Full mechanical support, abg reviewed Consider wean cpap 5 ps 5 x goal 1 hr, not extubation candidate Control Afterload Albuterol / Atrovent Neg balance goal pcxr in am   CARDIOVASCULAR  Lab 10/11/11 2141 10/11/11 0845  TROPONINI 1.27* 0.82*  LATICACIDVEN -- 1.9  PROBNP -- --   ECG:  7/29 >>> NSr, nonspecific ST-T changes Lines:  R tunneled HD cath ??? >>>  A: Suspected acute congestive heart failure.  Rising troponin, demand ischemia vs NSTEMI.  Hypertensive emergency, not clear if primary or secondary to respiratory distress. P:  Trend cardiac enzymes TTE to evaluate EF and regional wall motion abnormalities Heparin gtt for ACS x 48 hrs Continue ASA, Norvasc, Hydralazine, Losartan, Metoprolol Goal SBP, 25% reduction from highest MAP Add home clonidine for rebound (home patch, ileus?) Add prn hydralazine Consider cards evaluation ecg neg St changes  RENAL  Lab 10/11/11 2141 10/11/11 0903 10/11/11 0845 10/08/11 2221 10/06/11 2359  NA -- 136 137 138 142  K -- 4.8 4.9 -- --  CL -- 102 96 96 100  CO2 -- -- 25 28 27   BUN -- 61* 62* 53* 71*  CREATININE 3.48* 7.00* 7.65* 7.11* 7.69*  CALCIUM -- -- 9.8 9.1 9.4  MG -- -- -- -- --  PHOS -- -- -- 3.4 2.9  Intake/Output      07/29 0701 - 07/30 0700 07/30 0701 - 07/31 0700   I.V. (mL/kg) 84.2 (1.4)    NG/GT 30    IV Piggyback 60    Total Intake(mL/kg) 174.2 (3)    Emesis/NG output 600    Other 1707    Total Output 2307    Net -2132.8          Foley:  NA  A:  ESRD on HD.  History of medical noncompliance. P:   HD per Renal Zemplar Trend BMP in am   GASTROINTESTINAL  Lab 10/08/11 2221 10/06/11 2359  AST -- --  ALT -- --  ALKPHOS -- --  BILITOT -- --  PROT -- --  ALBUMIN 3.3* 3.3*   A:  malnurished P:   Start TF    ppi kub assess ileus  HEMATOLOGIC  Lab 10/11/11 2141 10/11/11 0903 10/11/11 0845 10/08/11 2221 10/06/11 2359  HGB 8.3* 11.2* 9.4* 8.7* 8.6*  HCT 26.6* 33.0* 30.3* 27.1* 27.4*  PLT 170 -- 179 161 155  INR -- -- -- -- --  APTT -- -- -- -- --   A:  Anemia.  No overt hemorrhage. P:  Trend CBC Sub q hep Follow up cbc, dilution?  INFECTIOUS  Lab 10/11/11 2141 10/11/11 0845 10/08/11 2221 10/06/11 2359  WBC 7.8 12.7* 5.9 7.3  PROCALCITON -- -- -- --   Cultures: 7/30  Respiratory >>>  Antibiotics: Vancomycin 7/29 >>> Zosyn 7/29 >>>  A:  Suspected HCAP / aspiration pneumonia. P:   Cultures / Antibiotics as above PCT trend to de escalate  ENDOCRINE  Lab 10/12/11 0746 10/12/11 0233 10/12/11 0120 10/12/11 0040 10/11/11 1958  GLUCAP 110* 137* 107* 48* 102*   A:  Hypoglycemia.  Hyperglycemia.  DM. P:   CBG/SSI  NEUROLOGIC  A:  Acute encephalopathy, multifactorial. P:   Propofol gtt for goal RASS 0 to -1 Daily WUA  BEST PRACTICE / DISPOSITION Level of Care:  ICU Primary Service:  PCCM, back to [facility] when out of ICU Consultants:  Renal Code Status:  Full Diet:  TF 7/30 DVT Px:  Not indicated (on Heparin gtt) GI Px:  Protonix Skin Integrity:  Intact  Social / Family:  Update daughter Quincy Simmonds (OB/GYN MD in [place]), will call now  The patient is critically ill with multiple organ systems failure and requires high complexity decision making for assessment and support, frequent evaluation and titration of therapies, application of advanced monitoring technologies and extensive interpretation of multiple databases. Critical Care Time devoted to patient care services described in this note is 45 minutes.  Nelda Bucks., M.D. Pulmonary and Critical Care Medicine [facility] Pager: [phone]  10/12/2011, 8:13 AM

## 2011-10-12 NOTE — Consult Note (Signed)
  Pt.  Had to be intubated  Last night and moved to the ICU .schedule dialysis and cardiology  Consult.

## 2011-10-12 NOTE — Progress Notes (Signed)
Called at 0230 by bedside RN for patient's O2 sats 74%, upon arrival to room patient was 100% sats on NRB. Respiratory therapy at bedside, patient alert, oriented in acute distress. Patient diaphoretic, short of breath, RR 30, audible wheezes. HR 100, SBP 210s, patient did not have PIV access upon arrival. Patient refused PIV access at the beginning of the assessment, as the patient's condition deteriorated the patient was educated about the need for IV access for medications. PIV started without patient refusal. Attending NP at bedside and had respiratory therapy intiaite BiPAP therapy. Patient moved to 2311.

## 2011-10-12 NOTE — Progress Notes (Signed)
Patient ID: Alexandra [name], female   DOB: 16-Dec-1945, 66 y.o.   MRN: [id number]  Late Entry for 10/11/2011 at 6:00pm.  Patient seen post dialysis I now back to baseline. States that she feels back to her normal self. She is now satting 99% on room air and has no complaints. Lungs are clear to auscultation the patient has no increased work of breathing. Based on the patient's return to baseline after dialysis it is likely that her symptoms evolve been secondary to pulmonary edema. Light of this I will transfer the patient to 6700. The patient is wanting to leave AGAINST MEDICAL ADVICE to go home. I however had a long discussion with the patient and explained to her that we are still assessing her medically and would need to observe her through tomorrow. The patient has agreed to stay in the hospital.  Sharanda Shinault A.

## 2011-10-12 NOTE — Procedures (Signed)
Name: Alexandra [name] MRN: [id number] DOB: 01-31-1946   PROCEDURE NOTE  Procedure:  Endotracheal intubation.  Indication:  Acute respiratory failure  Consent:  Consent was implied due to the emergency nature of the procedure.  Anesthesia:  A total of 10 mg of Etomidate was given intravenously.  Procedure summary:  Appropriate equipment was assembled. The patient was identified as Alexandra [name] and safety timeout was performed. The patient was placed supine, with head in sniffing position. After adequate level of anesthesia was achieved, a GS#4 blade was inserted into the oropharynx and the vocal cords were visualized. A 7.5 endotracheal tube was inserted without difficulty and visualized going through the vocal cords. The stylette was removed and cuff inflated. Colorimetric change was noted on the CO2 meter. Breath sounds were heard over both lung fields equally. Post procedure chest xray was ordered.  Complications:  No immediate complications were noted.  Hemodynamic parameters and oxygenation remained stable throughout the procedure.    Orlean Bradford, M.D. Pulmonary and Critical Care Medicine [facility] Cell: [phone] Pager: [phone]  10/12/2011, 4:38 AM

## 2011-10-12 NOTE — Progress Notes (Addendum)
ANTIBIOTIC CONSULT NOTE - FOLLOW UP  Pharmacy Consult for Vancomycin and Zosyn Indication: rule out pneumonia  Allergies  Allergen Reactions  . Codeine Other (See Comments)    Passes out  . Epinephrine Other (See Comments)    Tachycardia, diaphoresis, syncope  . Percocet (Oxycodone-Acetaminophen) Other (See Comments)    Passes  out    Patient Measurements: Height: 5' 8.11" (173 cm) Weight: 126 lb 8.7 oz (57.4 kg) IBW/kg (Calculated) : 64.15   Vital Signs: Temp: 97.6 F (36.4 C) (07/30 1127) Temp src: Oral (07/30 1127) BP: 93/33 mmHg (07/30 1350) Pulse Rate: 71  (07/30 1350) Intake/Output from previous day: 07/29 0701 - 07/30 0700 In: 174.2 [I.V.:84.2; NG/GT:30; IV Piggyback:60] Out: 2307 [Emesis/NG output:600] Intake/Output from this shift: Total I/O In: 272.4 [I.V.:242.4; NG/GT:30] Out: 250 [Emesis/NG output:250]  Labs:  [facility] 10/12/11 1115 10/11/11 2141 10/11/11 0903 10/11/11 0845  WBC 10.6* 7.8 -- 12.7*  HGB 8.1* 8.3* 11.2* --  PLT 172 170 -- 179  LABCREA -- -- -- --  CREATININE 5.17* 3.48* 7.00* --   Estimated Creatinine Clearance: 9.4 ml/min (by C-G formula based on Cr of 5.17).  Basename 10/12/11 1123  VANCOTROUGH 13.6  VANCOPEAK --  VANCORANDOM --  GENTTROUGH --  GENTPEAK --  GENTRANDOM --  TOBRATROUGH --  TOBRAPEAK --  TOBRARND --  AMIKACINPEAK --  AMIKACINTROU --  AMIKACIN --     Microbiology: Recent Results (from the past 720 hour(s))  MRSA PCR SCREENING     Status: Normal   Collection Time   10/12/11  1:00 AM      Component Value Range Status Comment   MRSA by PCR NEGATIVE  NEGATIVE Final   CULTURE, RESPIRATORY     Status: Normal (Preliminary result)   Collection Time   10/12/11  4:36 AM      Component Value Range Status Comment   Specimen Description TRACHEAL ASPIRATE   Final    Special Requests NONE   Final    Gram Stain     Final    Value: NO WBC SEEN     NO SQUAMOUS EPITHELIAL CELLS SEEN     NO ORGANISMS SEEN   Culture  PENDING   Incomplete    Report Status PENDING   Incomplete     Anti-infectives     Start     Dose/Rate Route Frequency Ordered Stop   10/12/11 1500   vancomycin (VANCOCIN) IVPB 1000 mg/200 mL premix        1,000 mg 200 mL/hr over 60 Minutes Intravenous  Once 10/12/11 1352     10/12/11 1400  piperacillin-tazobactam (ZOSYN) IVPB 2.25 g       2.25 g 100 mL/hr over 30 Minutes Intravenous 3 times per day 10/12/11 0944     10/12/11 0600   piperacillin-tazobactam (ZOSYN) IVPB 3.375 g  Status:  Discontinued        3.375 g 100 mL/hr over 30 Minutes Intravenous 3 times per day 10/12/11 0502 10/12/11 0944   10/11/11 1030   vancomycin (VANCOCIN) IVPB 1000 mg/200 mL premix        1,000 mg 200 mL/hr over 60 Minutes Intravenous  Once 10/11/11 1028 10/11/11 1617   10/11/11 1030   piperacillin-tazobactam (ZOSYN) IVPB 2.25 g  Status:  Discontinued        2.25 g 100 mL/hr over 30 Minutes Intravenous  Once 10/11/11 1028 10/11/11 1838   10/11/11 1000   moxifloxacin (AVELOX) IVPB 400 mg  Status:  Discontinued  400 mg 250 mL/hr over 60 Minutes Intravenous  Once 10/11/11 0946 10/11/11 1026          Assessment: 66 yo female with ESRD on HD admitted with acute respiratory failure. Pharmacy consulted to manage vancomycin and Zosyn for r/o pneumonia. Patient received vancomycin 1gm IV x 1 with HD on 7/29. Vanc trough came back low at 13.6 today.   Goal of Therapy:  Pre-HD vancomycin trough of 20-25  Plan:   - Give additional vancomycin 1g IV x 1 today - Schedule maintenance vancomycin 750mg  IV post-dialysis when dialysis is scheduled - Continue Zosyn 2.25gm IV Q8H (30 min infusion).    Vania Rea. Darin Engels.D. Clinical Pharmacist Pager [phone] Phone [phone] 10/12/2011 2:16 PM

## 2011-10-12 NOTE — Progress Notes (Addendum)
  Echocardiogram 2D Echocardiogram has been performed.  Alexandra [name] 10/12/2011, 12:08 PM

## 2011-10-12 NOTE — Progress Notes (Signed)
Transferred to 2300.

## 2011-10-12 NOTE — Progress Notes (Signed)
Pt. In respiratory distress, diaphoretic. O2 sat is 74% on 2L [place]. Placed patient on non-rebreather. Notified Respiratory Therapy, Rapid Response, and Maren Reamer NP.

## 2011-10-12 NOTE — Progress Notes (Signed)
ANTICOAGULATION CONSULT NOTE - Initial Consult  Pharmacy Consult for heparin, vancomycin, Zosyn Indication: chest pain/ACS, r/o pneumonia   Allergies  Allergen Reactions  . Codeine Other (See Comments)    Passes out  . Epinephrine Other (See Comments)    Tachycardia, diaphoresis, syncope  . Percocet (Oxycodone-Acetaminophen) Other (See Comments)    Passes  out    Patient Measurements: Height: 5' 8.11" (173 cm) Weight: 128 lb 15.5 oz (58.5 kg) IBW/kg (Calculated) : 64.15  Heparin Dosing Weight: 58.5 kg   Vital Signs: Temp: 97.7 F (36.5 C) (07/29 2001) Temp src: Oral (07/29 2001) BP: 150/52 mmHg (07/30 0430) Pulse Rate: 76  (07/30 0430)  Labs:  Basename 10/11/11 2141 10/11/11 0903 10/11/11 0845  HGB 8.3* 11.2* --  HCT 26.6* 33.0* 30.3*  PLT 170 -- 179  APTT -- -- --  LABPROT -- -- --  INR -- -- --  HEPARINUNFRC -- -- --  CREATININE 3.48* 7.00* 7.65*  CKTOTAL 288* -- 263*  CKMB 9.2* -- 9.6*  TROPONINI 1.27* -- 0.82*    Estimated Creatinine Clearance: 14.3 ml/min (by C-G formula based on Cr of 3.48).   Medical History: Past Medical History  Diagnosis Date  . Chronic kidney disease     esrd  . Diabetes mellitus   . Hyperlipidemia   . Hypertension   . Renal disorder   . Dialysis care     tues, thurs, sat  . Gout due to renal impairment     Medications:  Scheduled:    . albuterol  2.5 mg Nebulization Once  . albuterol      . albuterol-ipratropium  6 puff Inhalation Q4H  . amLODipine  10 mg Oral Daily  . antiseptic oral rinse  1 application Mouth Rinse QID  . aspirin  324 mg Oral Once  . calcium acetate  667 mg Oral QID  . chlorhexidine  15 mL Mouth/Throat BID  . cloNIDine  0.1 mg Transdermal Weekly  . docusate sodium  200 mg Oral QHS  . epoetin (EPOGEN/PROCRIT) injection  10,000 Units Intravenous Q M,W,F-HD  . etomidate      . fentaNYL      . heparin flush  20 Units Intracatheter Q12H  . hydrALAZINE  10 mg Intravenous Once  . hydrALAZINE  50 mg  Oral Q8H  . insulin aspart  0-15 Units Subcutaneous Q4H  . ipratropium      . ipratropium  0.5 mg Nebulization Once  . l-methylfolate-B6-B12  1 tablet Oral BID  . losartan  100 mg Oral QHS  . metoprolol succinate  100 mg Oral BID  . midazolam      . multivitamin  1 tablet Oral Daily  . pantoprazole (PROTONIX) IV  40 mg Intravenous QHS  . paricalcitol  1 mcg Intravenous 3 times weekly  . sodium chloride  10-40 mL Intracatheter Q12H  . sodium chloride  3 mL Intravenous Q12H  . vancomycin  1,000 mg Intravenous Once  . DISCONTD: allopurinol  100 mg Oral Daily  . DISCONTD: epoetin alfa  10,000 Units Intravenous Q M,W,F-2000  . DISCONTD: heparin  5,000 Units Subcutaneous Q8H  . DISCONTD: insulin aspart  0-15 Units Subcutaneous TID WC  . DISCONTD: insulin aspart  0-5 Units Subcutaneous QHS  . DISCONTD: loratadine  10 mg Oral Daily  . DISCONTD: moxifloxacin  400 mg Intravenous Once  . DISCONTD: piperacillin-tazobactam (ZOSYN)  IV  2.25 g Intravenous Once  . DISCONTD: propofol        Assessment: 66 yo female  with ESRD on HD admitted with acute respiratory failure. Pharmacy consulted to manage IV heparin for r/o ACS. Pharmacy also consulted to manage vancomycin and Zosyn for r/o pneumonia. Patient received vancomycin 1gm IV x 1 with HD on 7/29.   Goal of Therapy:  Heparin level 0.3-0.7 units/ml Monitor platelets by anticoagulation protocol: Yes Pre-HD vancomycin level 15-25 mcg/mL   Plan:  1. Zosyn 2.25gm IV Q8H (30 min infusion).  2. Will not schedule vancomycin maintenance dose for now. Instead, follow-up HD schedule to dose vancomycin.  3. Heparin IV bolus of 3500 units x 1, then IV infusion of 700 units/hr.  4. Heparin level in 8 hours.  5. Daily CBC, heparin level.  Emeline Gins 10/12/2011,4:46 AM

## 2011-10-12 NOTE — Progress Notes (Signed)
Late entry event note for 7/29-7/30 7p-7a shift: This NP was called by RN for pt having resp distress and rapid response at bedside. NP to bedside.   S: Pt is SOB and restless. She admits to having difficulty breathing. No chest pain. RN tells NP pt's BP is high. O: Pt is in significant distress sitting on side of bed. Increased work of breathing. She is moaning and restless. On NRB at present which was placed by RN prior to NP arrival. Pt had refused to have Bipap placed, but now after discussion, agrees to placement. She is pale and diaphoretic. Card: RRR with tachy. Resp: increased effort with crackles. Pt placed back supine by staff members with HOB elevated at max. New IV started Left arm by this NP. Hydralazine 10mg  given IV by RN. Bipap on per RT. Leads and heart monitor placed and pt taken immediately to ICU. Upon arrival there, pt's mental status had deteriorated slightly, but then she was able to say she wanted to be intubated. Also, this NP and Dr. Herma Carson (PCCM) spoke to daughter, Joni Reining, via phone who said her mother and her would want intubation. Pt is a full code, verified by daughter.  A/P: 1. Pt admitted with volume overload and resp difficulty taken emergently to dialysis earlier today. After which, pt refused to be placed on Bipap or be transferred to step down-according to review of pt records. Pt has a hx of medical non compliance. She had apparently improved after dialysis, but deteriorated this pm advancing to acute resp distress and hypertension requiring Bipap placement, IV meds and emergent TF to ICU. After TF, Dr. Herma Carson intubated pt. Please see his notes for further tx.  PCCM will take over pt's care until she is moved out of ICU.  Maren Reamer, NP [facility]

## 2011-10-12 NOTE — Consult Note (Signed)
Reason for Consult: Acute pulmonary edema/elevated troponin I. Referring Physician: Dr. Jeri Cos  Alexandra [name] is an 66 y.o. female.  HPI: Patient is 66 year old female with past medical history significant for hypertension, diabetes mellitus, end-stage renal disease on hemodialysis, hypercholesteremia, gouty arthritis, anemia of chronic disease, peripheral vascular disease status post left carotid endarterectomy, was admitted yesterday because of sudden onset of shortness of breath and was noted to be in hypertensive emergency requiring intubation in the ER. Her patient presently intubated sedated. Cardiologic consultation is called as patient was noted to have progressive mildly elevated troponin I. EKG showed normal sinus rhythm with no significant acute ischemic changes. Patient received hemodialysis yesterday and is about to receive another course of hemodialysis now. No history could be obtained from the patient as patient is intubated and sedated.  Past Medical History  Diagnosis Date  . Chronic kidney disease     esrd  . Diabetes mellitus   . Hyperlipidemia   . Hypertension   . Renal disorder   . Dialysis care     tues, thurs, sat  . 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 Carotid endarterectomy 2002    left  . Btl   . Rectal fissurectomy   . Av fistula placement 10/30/2010    right brachiocephalic   . Fistulogram     Family History  Problem Relation Age of Onset  . COPD Mother   . Kidney disease Mother   . Heart disease Father   . Lymphoma Son     Social History:  reports that she has been smoking Cigarettes.  She has a 50 pack-year smoking history. She has never used smokeless tobacco. She reports that she does not drink alcohol or use illicit drugs.  Allergies:  Allergies  Allergen Reactions  . Codeine Other (See Comments)    Passes out  . Epinephrine Other (See Comments)    Tachycardia, diaphoresis, syncope  .  Percocet (Oxycodone-Acetaminophen) Other (See Comments)    Passes  out    Medications: I have reviewed the patient's current medications.  Results for orders placed during the hospital encounter of 10/11/11 (from the past 48 hour(s))  GLUCOSE, CAPILLARY     Status: Abnormal   Collection Time   10/11/11  8:12 AM      Component Value Range Comment   Glucose-Capillary 197 (*) 70 - 99 mg/dL    Comment 1 Notify RN     CBC WITH DIFFERENTIAL     Status: Abnormal   Collection Time   10/11/11  8:45 AM      Component Value Range Comment   WBC 12.7 (*) 4.0 - 10.5 K/uL    RBC 4.05  3.87 - 5.11 MIL/uL    Hemoglobin 9.4 (*) 12.0 - 15.0 g/dL    HCT 72.5 (*) 36.6 - 46.0 %    MCV 74.8 (*) 78.0 - 100.0 fL    MCH 23.2 (*) 26.0 - 34.0 pg    MCHC 31.0  30.0 - 36.0 g/dL    RDW 44.0 (*) 34.7 - 15.5 %    Platelets 179  150 - 400 K/uL    Neutrophils Relative 92 (*) 43 - 77 %    Lymphocytes Relative 4 (*) 12 - 46 %    Monocytes Relative 4  3 - 12 %    Eosinophils Relative 0  0 - 5 %    Basophils Relative 0  0 - 1 %  Neutro Abs 11.7 (*) 1.7 - 7.7 K/uL    Lymphs Abs 0.5 (*) 0.7 - 4.0 K/uL    Monocytes Absolute 0.5  0.1 - 1.0 K/uL    Eosinophils Absolute 0.0  0.0 - 0.7 K/uL    Basophils Absolute 0.0  0.0 - 0.1 K/uL    RBC Morphology POLYCHROMASIA PRESENT     BASIC METABOLIC PANEL     Status: Abnormal   Collection Time   10/11/11  8:45 AM      Component Value Range Comment   Sodium 137  135 - 145 mEq/L    Potassium 4.9  3.5 - 5.1 mEq/L    Chloride 96  96 - 112 mEq/L    CO2 25  19 - 32 mEq/L    Glucose, Bld 203 (*) 70 - 99 mg/dL    BUN 62 (*) 6 - 23 mg/dL    Creatinine, Ser 1.61 (*) 0.50 - 1.10 mg/dL    Calcium 9.8  8.4 - 09.6 mg/dL    GFR calc non Af Amer 5 (*) >90 mL/min    GFR calc Af Amer 6 (*) >90 mL/min   CARDIAC PANEL(CRET KIN+CKTOT+MB+TROPI)     Status: Abnormal   Collection Time   10/11/11  8:45 AM      Component Value Range Comment   Total CK 263 (*) 7 - 177 U/L    CK, MB 9.6 (*) 0.3  - 4.0 ng/mL    Troponin I 0.82 (*) <0.30 ng/mL    Relative Index 3.7 (*) 0.0 - 2.5   LACTIC ACID, PLASMA     Status: Normal   Collection Time   10/11/11  8:45 AM      Component Value Range Comment   Lactic Acid, Venous 1.9  0.5 - 2.2 mmol/L   POCT I-STAT, CHEM 8     Status: Abnormal   Collection Time   10/11/11  9:03 AM      Component Value Range Comment   Sodium 136  135 - 145 mEq/L    Potassium 4.8  3.5 - 5.1 mEq/L    Chloride 102  96 - 112 mEq/L    BUN 61 (*) 6 - 23 mg/dL    Creatinine, Ser 0.45 (*) 0.50 - 1.10 mg/dL    Glucose, Bld 409 (*) 70 - 99 mg/dL    Calcium, Ion 8.11  9.14 - 1.30 mmol/L    TCO2 24  0 - 100 mmol/L    Hemoglobin 11.2 (*) 12.0 - 15.0 g/dL    HCT 78.2 (*) 95.6 - 46.0 %   GLUCOSE, CAPILLARY     Status: Normal   Collection Time   10/11/11  5:48 PM      Component Value Range Comment   Glucose-Capillary 74  70 - 99 mg/dL   GLUCOSE, CAPILLARY     Status: Abnormal   Collection Time   10/11/11  6:39 PM      Component Value Range Comment   Glucose-Capillary 106 (*) 70 - 99 mg/dL    Comment 1 Documented in Chart      Comment 2 Notify RN     GLUCOSE, CAPILLARY     Status: Abnormal   Collection Time   10/11/11  7:58 PM      Component Value Range Comment   Glucose-Capillary 102 (*) 70 - 99 mg/dL   CREATININE, SERUM     Status: Abnormal   Collection Time   10/11/11  9:41 PM      Component Value  Range Comment   Creatinine, Ser 3.48 (*) 0.50 - 1.10 mg/dL DELTA CHECK NOTED   GFR calc non Af Amer 13 (*) >90 mL/min    GFR calc Af Amer 15 (*) >90 mL/min   CARDIAC PANEL(CRET KIN+CKTOT+MB+TROPI)     Status: Abnormal   Collection Time   10/11/11  9:41 PM      Component Value Range Comment   Total CK 288 (*) 7 - 177 U/L    CK, MB 9.2 (*) 0.3 - 4.0 ng/mL CRITICAL VALUE NOTED.  VALUE IS CONSISTENT WITH PREVIOUSLY REPORTED AND CALLED VALUE.   Troponin I 1.27 (*) <0.30 ng/mL    Relative Index 3.2 (*) 0.0 - 2.5   CBC WITH DIFFERENTIAL     Status: Abnormal   Collection Time    10/11/11  9:41 PM      Component Value Range Comment   WBC 7.8  4.0 - 10.5 K/uL    RBC 3.57 (*) 3.87 - 5.11 MIL/uL    Hemoglobin 8.3 (*) 12.0 - 15.0 g/dL DELTA CHECK NOTED   HCT 26.6 (*) 36.0 - 46.0 %    MCV 74.5 (*) 78.0 - 100.0 fL    MCH 23.2 (*) 26.0 - 34.0 pg    MCHC 31.2  30.0 - 36.0 g/dL    RDW 96.0 (*) 45.4 - 15.5 %    Platelets 170  150 - 400 K/uL    Neutrophils Relative 67  43 - 77 %    Lymphocytes Relative 18  12 - 46 %    Monocytes Relative 14 (*) 3 - 12 %    Eosinophils Relative 1  0 - 5 %    Basophils Relative 0  0 - 1 %    Neutro Abs 5.2  1.7 - 7.7 K/uL    Lymphs Abs 1.4  0.7 - 4.0 K/uL    Monocytes Absolute 1.1 (*) 0.1 - 1.0 K/uL    Eosinophils Absolute 0.1  0.0 - 0.7 K/uL    Basophils Absolute 0.0  0.0 - 0.1 K/uL    RBC Morphology POLYCHROMASIA PRESENT     GLUCOSE, CAPILLARY     Status: Abnormal   Collection Time   10/12/11 12:40 AM      Component Value Range Comment   Glucose-Capillary 48 (*) 70 - 99 mg/dL   MRSA PCR SCREENING     Status: Normal   Collection Time   10/12/11  1:00 AM      Component Value Range Comment   MRSA by PCR NEGATIVE  NEGATIVE   GLUCOSE, CAPILLARY     Status: Abnormal   Collection Time   10/12/11  1:20 AM      Component Value Range Comment   Glucose-Capillary 107 (*) 70 - 99 mg/dL   GLUCOSE, CAPILLARY     Status: Abnormal   Collection Time   10/12/11  2:33 AM      Component Value Range Comment   Glucose-Capillary 137 (*) 70 - 99 mg/dL   CULTURE, RESPIRATORY     Status: Normal (Preliminary result)   Collection Time   10/12/11  4:36 AM      Component Value Range Comment   Specimen Description TRACHEAL ASPIRATE      Special Requests NONE      Gram Stain        Value: NO WBC SEEN     NO SQUAMOUS EPITHELIAL CELLS SEEN     NO ORGANISMS SEEN   Culture PENDING      Report  Status PENDING     POCT I-STAT 3, BLOOD GAS (G3+)     Status: Abnormal   Collection Time   10/12/11  4:50 AM      Component Value Range Comment   pH, Arterial  7.342 (*) 7.350 - 7.450    pCO2 arterial 65.8 (*) 35.0 - 45.0 mmHg    pO2, Arterial 171.0 (*) 80.0 - 100.0 mmHg    Bicarbonate 35.7 (*) 20.0 - 24.0 mEq/L    TCO2 38  0 - 100 mmol/L    O2 Saturation 99.0      Acid-Base Excess 8.0 (*) 0.0 - 2.0 mmol/L    Patient temperature 98.6 F      Collection site RADIAL, ALLEN'S TEST ACCEPTABLE      Drawn by RT      Sample type ARTERIAL      Comment NOTIFIED PHYSICIAN     GLUCOSE, CAPILLARY     Status: Abnormal   Collection Time   10/12/11  7:46 AM      Component Value Range Comment   Glucose-Capillary 110 (*) 70 - 99 mg/dL     Dg Chest 1 View  7/84/6962  *RADIOLOGY REPORT*  Clinical Data: Shortness of breath.  Status post dialysis.  CHEST - 1 VIEW  Comparison: Earlier today.  Findings: The pulmonary vasculature and interstitial markings are less prominent.  The cardiac silhouette remains borderline enlarged.  Stable right jugular double-lumen catheter and thoracic spine degenerative changes.  IMPRESSION: Improving changes of congestive heart failure.  Original Report Authenticated By: Darrol Angel, M.D.   Dg Chest Port 1 View  10/12/2011  *RADIOLOGY REPORT*  Clinical Data: Assess endotracheal tube.  PORTABLE CHEST - 1 VIEW  Comparison: 10/11/2011  Findings: Endotracheal tube tip 4.7 cm proximal to the carina. Bibasilar opacities.  Cardiomediastinal contours unchanged.  No definite pneumothorax.  Small effusions not excluded.  Right IJ catheter tip projects over the mid SVC.  IMPRESSION: Endotracheal tube tip 4.7 cm proximal to the carina.  Bibasilar opacities; atelectasis, aspiration, or infiltrate.  Original Report Authenticated By: Waneta Martins, M.D.   Dg Chest Portable 1 View  10/11/2011  **ADDENDUM** CREATED: 10/11/2011 11:35:10  Addendum for further clarification:  The patient was discussed with Dr Ashley Royalty.  There are mildly increased interstitial markings, much of which is chronic, although mild volume overload is suspected.  The bibasilar  opacities likely reflect atelectasis in the absence of fever or other infectious symptoms.  **END ADDENDUM** SIGNED BY: Charline Bills, M.D.   10/11/2011  *RADIOLOGY REPORT*  Clinical Data: Shortness of breath  PORTABLE CHEST - 1 VIEW  Comparison: 07/12/2011  Findings: Mild patchy bilateral lower lobe opacities, atelectasis versus pneumonia.  No frank interstitial edema.  No pneumothorax.  The heart is top normal in size.  Stable right IJ dual lumen dialysis catheter.  IMPRESSION: Mild patchy bilateral lower lobe opacities, atelectasis versus pneumonia.  Original Report Authenticated By: Charline Bills, M.D.   Dg Abd Portable 1v  10/12/2011  *RADIOLOGY REPORT*  Clinical Data: Rule out ileus.  History of renal failure.  PORTABLE ABDOMEN - 1 VIEW  Comparison: None.  Findings: NG tube tip is in the antrum of the stomach.  Moderate stool in the ascending colon.  No disproportionate dilatation of small bowel.  No obvious free intraperitoneal gas.  IMPRESSION: NG tube tip in antrum of the stomach.  Moderate stool in the ascending colon.  Nonobstructive bowel gas pattern.  Original Report Authenticated By: Donavan Burnet, M.D.  Review of Systems  Unable to perform ROS: intubated   Blood pressure 122/44, pulse 77, temperature 97.8 F (36.6 C), temperature source Oral, resp. rate 12, height 5' 8.11" (1.73 m), weight 58.6 kg (129 lb 3 oz), SpO2 99.00%. Physical Exam  HENT:  Head: Normocephalic and atraumatic.  Eyes:       Pupils equal 2 mm sluggishly reactive  Neck: Normal range of motion. Neck supple. No JVD present. No tracheal deviation present. No thyromegaly present.  Cardiovascular: Normal rate and regular rhythm.  Exam reveals no friction rub.   Murmur: Soft systolic murmur noted andd S3 gallop no pericardial rub. Respiratory:       Decreased breath sound at bases with occasional rhonchi and rales  GI: Soft. Bowel sounds are normal. She exhibits no distension.  Musculoskeletal: She  exhibits no edema and no tenderness.    Assessment/Plan: Probable very small non-Q-wave myocardial infarction secondary to demand ischemia rule out coronary insufficiency Status post hypertensive emergency Resolving acute pulmonary edema secondary to volume overload/rule out aspiration Diabetes mellitus End-stage renal disease on hemodialysis Hypercholesteremia History of gouty arthritis Acute on chronic anemia rule out GI loss Plan  Agree with present management  Add statins once able to take by mouth Check lipid panel in a.m. Check serial enzymes and EKG Check 2-D echo  Agree with aggressive hemodialysis Weaning per CCM Will discuss with patient regarding further cardiac workup depending on her clinical course and EKG and cardiac enzymes Agree with IV heparin for now and IV nitrates if blood pressure tolerates Jessia Kief N 10/12/2011, 10:51 AM

## 2011-10-12 NOTE — Progress Notes (Signed)
INITIAL ADULT NUTRITION ASSESSMENT Date: 10/12/2011   Time: 9:46 AM  INTERVENTION:  Nepro formula at 15 ml/hr  Once Propofol discontinued, will increase Nepro to goal rate of 25 ml/hr with Prostat liquid protein twice daily via tube to provide 1280 total kcals, 79 gm protein, 436 ml of free water  RD to follow for nutrition care plan  Reason for Assessment: Consult  ASSESSMENT: Female 66 y.o.  Dx: acute respiratory failure, encephalopathy, CHF  Hx:  Past Medical History  Diagnosis Date  . Chronic kidney disease     esrd  . Diabetes mellitus   . Hyperlipidemia   . Hypertension   . Renal disorder   . Dialysis care     tues, thurs, sat  . Gout due to renal impairment     Related Meds:     . albuterol  2.5 mg Nebulization Once  . albuterol      . albuterol-ipratropium  6 puff Inhalation Q4H  . amLODipine  10 mg Oral Daily  . antiseptic oral rinse  15 mL Mouth Rinse QID  . aspirin  324 mg Oral Once  . calcium acetate  667 mg Oral QID  . chlorhexidine  15 mL Mouth/Throat BID  . cloNIDine  0.1 mg Transdermal Weekly  . docusate sodium  200 mg Oral QHS  . epoetin (EPOGEN/PROCRIT) injection  10,000 Units Intravenous Q M,W,F-HD  . etomidate      . fentaNYL      . heparin  3,500 Units Intravenous Once  . heparin flush  20 Units Intracatheter Q12H  . hydrALAZINE  10 mg Intravenous Once  . hydrALAZINE  50 mg Oral Q8H  . insulin aspart  0-15 Units Subcutaneous Q4H  . ipratropium      . ipratropium  0.5 mg Nebulization Once  . l-methylfolate-B6-B12  1 tablet Oral BID  . losartan  100 mg Oral QHS  . metoprolol succinate  100 mg Oral BID  . midazolam      . multivitamin  1 tablet Oral Daily  . pantoprazole (PROTONIX) IV  40 mg Intravenous QHS  . paricalcitol  1 mcg Intravenous 3 times weekly  . piperacillin-tazobactam (ZOSYN)  IV  2.25 g Intravenous Q8H  . sodium chloride  10-40 mL Intracatheter Q12H  . sodium chloride  3 mL Intravenous Q12H  . vancomycin  1,000 mg  Intravenous Once  . DISCONTD: allopurinol  100 mg Oral Daily  . DISCONTD: antiseptic oral rinse  1 application Mouth Rinse QID  . DISCONTD: chlorhexidine  15 mL Mouth Rinse BID  . DISCONTD: epoetin alfa  10,000 Units Intravenous Q M,W,F-2000  . DISCONTD: heparin  5,000 Units Subcutaneous Q8H  . DISCONTD: insulin aspart  0-15 Units Subcutaneous TID WC  . DISCONTD: insulin aspart  0-5 Units Subcutaneous QHS  . DISCONTD: loratadine  10 mg Oral Daily  . DISCONTD: moxifloxacin  400 mg Intravenous Once  . DISCONTD: piperacillin-tazobactam (ZOSYN)  IV  2.25 g Intravenous Once  . DISCONTD: piperacillin-tazobactam  3.375 g Intravenous Q8H  . DISCONTD: propofol        Ht: 5' 8.11" (173 cm)  Wt: 58.6 kg (129 lb)  Ideal Wt: 63.6 kg % Ideal Wt: 92%  Usual Wt: 58.1 kg -- post-dialysis 7/29 % Usual Wt: 101%  Body mass index is 19.58 kg/(m^2).  Food/Nutrition Related Hx: no triggers per admission nutrition screen  Labs:  CMP     Component Value Date/Time   NA 136 10/11/2011 0903   K 4.8 10/11/2011 8469  CL 102 10/11/2011 0903   CO2 25 10/11/2011 0845   GLUCOSE 197* 10/11/2011 0903   BUN 61* 10/11/2011 0903   CREATININE 3.48* 10/11/2011 2141   CALCIUM 9.8 10/11/2011 0845   CALCIUM 7.2* 08/22/2009 0440   PROT 7.3 10/04/2011 1855   ALBUMIN 3.3* 10/08/2011 2221   AST 28 10/04/2011 1855   ALT 26 10/04/2011 1855   ALKPHOS 126* 10/04/2011 1855   BILITOT 0.3 10/04/2011 1855   GFRNONAA 13* 10/11/2011 2141   GFRAA 15* 10/11/2011 2141     Intake/Output Summary (Last 24 hours) at 10/12/11 0947 Last data filed at 10/12/11 0700  Gross per 24 hour  Intake  174.2 ml  Output   2307 ml  Net -2132.8 ml    CBG (last 3)   Basename 10/12/11 0746 10/12/11 0233 10/12/11 0120  GLUCAP 110* 137* 107*    Diet Order: NPO  Supplements/Tube Feeding: Nepro formula ordered via Adult Enteral Nutrition Protocol   IVF:    sodium chloride Last Rate: 10 mL/hr at 10/12/11 0700  feeding supplement (NEPRO CARB  STEADY)   heparin Last Rate: 700 Units/hr (10/12/11 0700)  propofol Last Rate: 70 mcg/kg/min (10/12/11 0731)    Estimated Nutritional Needs:   Kcal: 1300-1400 Protein: 75-85 gm Fluid: per MD  RD unable to obtain nutrition hx from patient -- intubated & sedated; admitted on 7/29 with dyspnea and transferred to ICU due to acute respiratory distress requiring intubation; receiving hemodialysis; per RN, having significant output via OGT; current Propofol infusion at 24.6 ml/hr providing 649 fat kcals; Nepro formula to be initiated at 10 ml/hr; no skin breakdown noted at this time  NUTRITION DIAGNOSIS: -Inadequate oral intake (NI-2.1).  Status: Ongoing  RELATED TO: inability to eat  AS EVIDENCE BY: NPO status  MONITORING/EVALUATION(Goals): Goal: EN to meet >/= 90% of estimated nutrition needs Monitor: EN regimen & tolerance, respiratory status, weight, labs, I/O's  EDUCATION NEEDS: -No education needs identified at this time  Dietitian #: [phone]  DOCUMENTATION CODES Per approved criteria  -Not Applicable    Alger Memos 10/12/2011, 9:46 AM

## 2011-10-12 NOTE — Progress Notes (Signed)
Called to pts room due to respiratory distress.  Pt breathing very heavily and had an audible expiratory wheeze.  Dr requested bipap asap.  Pt is refusing most txs and procedures but pt was brought to 2300. bipap p[laced on pt @ 300 at 14/8 and 50% inituially, but turned up to 80% after transfer.

## 2011-10-12 NOTE — Progress Notes (Signed)
ANTICOAGULATION CONSULT NOTE - Follow Up Consult  Pharmacy Consult for Heparin Indication: chest pain/ACS  Allergies  Allergen Reactions  . Codeine Other (See Comments)    Passes out  . Epinephrine Other (See Comments)    Tachycardia, diaphoresis, syncope  . Percocet (Oxycodone-Acetaminophen) Other (See Comments)    Passes  out    Patient Measurements: Height: 5' 8.11" (173 cm) Weight: 123 lb 10.9 oz (56.1 kg) IBW/kg (Calculated) : 64.15  Heparin Dosing Weight: 56.1 Kg  Vital Signs: Temp: 97.6 F (36.4 C) (07/30 1556) Temp src: Oral (07/30 1556) BP: 195/60 mmHg (07/30 1900) Pulse Rate: 87  (07/30 1900)  Labs:  Basename 10/12/11 1829 10/12/11 1115 10/11/11 2141 10/11/11 0903 10/11/11 0845  HGB -- 8.1* 8.3* -- --  HCT -- 25.6* 26.6* 33.0* --  PLT -- 172 170 -- 179  APTT -- -- -- -- --  LABPROT -- -- -- -- --  INR -- -- -- -- --  HEPARINUNFRC 0.61 -- -- -- --  CREATININE -- 5.17* 3.48* 7.00* --  CKTOTAL -- 280* 288* -- 263*  CKMB -- 7.2* 9.2* -- 9.6*  TROPONINI -- 1.27* 1.27* -- 0.82*    Estimated Creatinine Clearance: 9.2 ml/min (by C-G formula based on Cr of 5.17).   Medications:  Prescriptions prior to admission  Medication Sig Dispense Refill  . allopurinol (ZYLOPRIM) 100 MG tablet Take 100 mg by mouth daily.        Marland Kitchen amLODipine (NORVASC) 5 MG tablet Take 10 mg by mouth daily.      . calcium acetate (PHOSLO) 667 MG capsule Take 667 mg by mouth 4 (four) times daily.       . cloNIDine (CATAPRES - DOSED IN MG/24 HR) 0.1 mg/24hr patch Place 1 patch onto the skin once a week. Changes on friday      . Coenzyme Q10 150 MG CAPS Take 300 mg by mouth daily. Hold while in hospital      . docusate sodium (COLACE) 100 MG capsule Take 200 mg by mouth at bedtime.        . hydrALAZINE (APRESOLINE) 25 MG tablet Take 50 mg by mouth 3 (three) times daily.       Marland Kitchen l-methylfolate-B6-B12 (METANX) 3-35-2 MG TABS Take 1 tablet by mouth 2 (two) times daily.       Marland Kitchen losartan (COZAAR)  50 MG tablet Take 100 mg by mouth at bedtime.       . metoprolol succinate (TOPROL-XL) 50 MG 24 hr tablet Take 100 mg by mouth 2 (two) times daily. Takes after dialysis (T, Th, Sat)      . multivitamin (RENA-VIT) TABS tablet Take 1 tablet by mouth daily.        . repaglinide (PRANDIN) 0.5 MG tablet Take 0.5 mg by mouth 3 (three) times daily before meals.         Assessment: [name] continues on heparin for ACS. Heparin level is therapeutic. Pending further cardiac work-up when more stable. Per RN report, no bleeding is evident.  Goal of Therapy:  Heparin level 0.3-0.7 units/ml Monitor platelets by anticoagulation protocol: Yes   Plan:  - Continue heparin infusion at 700 units/hr - Will f/up heparin level at 0500 tomorrow.  Arvo Ealy K. Allena Katz, PharmD, BCPS.  Clinical Pharmacist Pager [phone]. 10/12/2011 7:16 PM

## 2011-10-12 NOTE — Care Management Note (Signed)
Page 1 of 2   10/22/2011     8:52:32 AM   CARE MANAGEMENT NOTE 10/22/2011  Patient:  Alexandra [name], Alexandra [name]   Account Number:  [id number]  Date Initiated:  10/12/2011  Documentation initiated by:  Junius Creamer  Subjective/Objective Assessment:   adm w resp distress, on vent, goes to dialysis 3x/wk     Action/Plan:   lives alone, pcpdr charles ross  pt eval - recs hhpt   Anticipated DC Date:  10/21/2011   Anticipated DC Plan:  HOME W HOME HEALTH SERVICES  In-house referral  Clinical Social Worker      DC Associate Professor  CM consult      [facility] Choice  HOME HEALTH   Choice offered to / List presented to:  C-1 Patient   DME arranged  WALKER - ROLLING      DME agency  [facility]     HH arranged  HH-1 RN  HH-2 PT      HH agency  [facility]   Status of service:  Completed, signed off Medicare Important Message given?   (If response is "NO", the following Medicare IM given date fields will be blank) Date Medicare IM given:   Date Additional Medicare IM given:    Discharge Disposition:  HOME W HOME HEALTH SERVICES  Per UR Regulation:  Reviewed for med. necessity/level of care/duration of stay  If discussed at Long Length of Stay Meetings, dates discussed:    Comments:  10/22/11 8:27 Letha Cape RN, BSN [phone] Retro documentation- [place] went down on 8/8.-  on Wed 8/7 spoke with patient and her god daughter Alexandra [name].   Informed Pam that  patient is being dc home with  [facility] services  , Pam states she was concern about patient being home alone, she wanted patient to be evaluated by inpt rehab because she did want the patient to feel like she was in a nursing home and felt that she would do much better there.  I informed her that I will let the MD know and I also gave her a private duty list and informed her if she is denied then she will need to go home with hh services.  Pam still states that she was concern about her being alone because the patient was  having problems with  sitting to standing position, I asked if she had anyone that could stay with patient , she said no.  I informed her that the agency's on the private duty list have different prices but you have to pay privately,  she wanted me to talk with patient's other daughter Alexandra [name], I spoke with her on the phone she states she wants patient to be evaluated by inpatient rehab.  MD notified, he put order in , inpt rehab denied her, Alexandra [name] notified and then she asked for re eval by inpt rehab, per MD they have stated patient is not a rehab candidate, Alexandra [name] states she did not really want pt to go to snf but she does not want her to go home alone either, so she states we could go ahead and try to see if a snf would take her , this was very late in evening around 16:50.  I informed CSW and  MD, on 8/8 CSW spoke with Alexandra [name] , the onsite liason  who states she does not think patient will be approved, CSW forwarded information to [place] to be processed.  The medical director at [facility]  was notified of this process, who also contacted the medical director of [employer] to see if patient would be approved. When we heard back the patient was denied for snf, we informed patient about the response from [facility], she states she wants to talk to the person we talked to at [facility] which was [name],  CSW gave her Erica's phone number.  I asked patient if we could provide transportation for her , we could get a cab for her she states she is not ready to go home.  I informed her that we have enjoyed her stay here but she is medically better now and that she is for dc today, and that I have to be honest with her if she does not leave that she will need to be escorted out by secureity per the Wellsite geologist. Patient states she is going to call Alexandra [name], she calls Alexandra [name] but Alexandra [name] was not in.  I informed the Medical director that I relayed this message to the patient, and he said he would call Debbie and handle  ther rest.   Patient received her rolling walker in the room from [facility] around 3:30.  She is set up with [facility] for [facility] and PT.  The HD person , Cordelia Pen is in process of trying to get [name] to take patient for HD , she states they are still working on it.  Patient's nephrologist is also trying to see if [facility] MD's would take her at [name], but he has not gotten anywhere with this yet.  Looks like there may be a possibility at [name] per [place] after they review paper work.  10/21/11 15:16 Letha Cape RN, BSN [phone] spoke with patient's Daughter, Alexandra [name] on 8/7  she states she would like to have inpt rehab to re evaluate her, I informed MD , he states they have already evaluated and stated patient is not inpt rehab candidate, I informed the daugher of this information.  She states she did not really want to look at snf right now because she thinis inpt rehab would be better for patient.  I informed her I would let MD and CSW know.   MD has stated to patient as of 8/8 she will be dc to home patient is medically ready for dc.  Patient states that she is not ready to go home until tomorrow.  She had her daughter on speaker phone and she wanted to speak to me, she said how are you going to send a patient home alone when she tells you that she is having problems form sit to stand position.  Informed her that physical therapy has worked with patient and they states she is min guard ast.  Daughter states she would like for Alexandra [name] to send information out to snf.  I informed CSW and MD , patient's information was sent out to SNF and has been denied per [facility].  CSW and I informed patient of this information, she wanted to speak with someone at [facility] so CSW gave patient the onsite liason phone number.  Christoper Allegra has been called to deliver patient her rolling walker to her room they are already here at the hospital to deliver the roling walker.  Patient will also go home with [facility] and pt  with [facility], we will provide transportation if needed, CSW is aware.   CSW also provide meals on wheels information and gave patient scat information and application today.  We are still in  process of looking for diaylsis center for patient, Cordelia Pen in HD states she is working on [employer] in [place] and she is waiting to hear from them.  Dr. Bascom Levels has been trying to get in touch with [facility] with the [facility] MD's but has been unable to.  [facility] has been notified that patient is for dc today.  NCM will continue to follow for dc needs.  10/20/11 14:13 Letha Cape RN, BSN [phone] Dr. Bascom Levels called me and states that he tried to call the number below and it was disconnected, I called the [facility] and they gave me another phone number which I gave to Dr. Bascom Levels [phone].  Dr. Bascom Levels has not spoken to [facility] MD yet about patient.  Dr. Bascom Levels said he will let Alexandra [name] know when he talks to them.  10/19/11 16:51 Letha Cape RN, BSN [phone] patient lives alone, I spoke with patient's God daughter , Alexandra [name], they would like for patient to be able to get some rehab before she goes back home in snf or inpt rehab.  Inpt rehab consulted, they rec home with hhpt , patient is walking 200 feet, she is to high functioning for inpt rehab.  I gave patient's God daughter a  private duty list earlier today when she was here .  I will speak to patient tomorrow about deniel from inpt rehab.   Also patient stated earlier in the day that she  would like to work with [facility] for hhpt and she will like to get  rolling walker from [facility].  Referral given to [facility] , Lupita Leash notified.  Will let patient know tomorrow about inpt rehabs decision.  I also called Dr. Ardelle Balls office and left a phone number for Dr. Loletha Grayer [phone] at [facility] and also a Dr. Delight Stare at [phone] at [facility].   10/15/11 1400 Henrietta Mayo RN MSN CCM Pt states her friend and her daughter will  be coming to see her later today.  Discussed PT eval to determine rehab needs since pt lives alone.  10/14/11 JULIE AMERSON,RN,BSN 1130 PT EXTUBATED THIS AM, BUT NOT TALKING SO FAR.  JUST RETURNING FROM CT.  WILL FOLLOW UP WITH PT AS SHE PROGRESSES.  PT HAS DAUGHTER IN [place], [place]:  DR METCALF [phone].  WILL ATTEMPT TO MAKE CONTACT WITH DAUGHTER ONCE FUNCTIONAL STATUS IS DETERMINED.  WILL CONSULT CSW TO FOLLOW SHOULD SNF BE NEEDED, AS PT LIVES ALONE.  PT WILL NEED PT AND OT CONSULTS.  10/13/11 JULIE AMERSON,RN,BSN 1400 PT REMAINS INTUBATED; ACUTE RESP FAILURE SECONDARY TO POSSIBLE PNA.  PT HAS A DAUGHTER WHO LIVES OUT OF STATE, AND IS A PHYSICIAN.  WILL FOLLOW PROGRESS.  7/30 9:05a debbie dowell rn,bsn [phone]

## 2011-10-12 NOTE — Consult Note (Signed)
Name: Alexandra [name] MRN: [id number] DOB: 10/23/1945    LOS: 1  Referring Provider:  [facility] Reason for Referral:  Acute respiratory failure  PULMONARY / CRITICAL CARE MEDICINE  The patient is encephalopathic and unable to provide history, which was obtained for available medical records.  HPI:  66 yo ESRD on HD admitted on 7/29 with dyspnea and transferred to ICU due to acute respiratory distress requiring intubation.  In ED:  BiPAP started, given Vancomycin / Zosyn.  Emergent HD upon admission.  Troponin elevated.  Past Medical History  Diagnosis Date  . Chronic kidney disease     esrd  . Diabetes mellitus   . Hyperlipidemia   . Hypertension   . Renal disorder   . Dialysis care     tues, thurs, sat  . Gout due to renal impairment    Past Surgical History  Procedure Date  . Carotid endarterectomy 2002    left  . Btl   . Rectal fissurectomy   . Av fistula placement 10/30/2010    right brachiocephalic   . Fistulogram    Prior to Admission medications   Medication Sig Start Date End Date Taking? Authorizing Provider  allopurinol (ZYLOPRIM) 100 MG tablet Take 100 mg by mouth daily.     Yes Historical Provider, MD  amLODipine (NORVASC) 5 MG tablet Take 10 mg by mouth daily. 07/13/11  Yes [name], MD  calcium acetate (PHOSLO) 667 MG capsule Take 667 mg by mouth 4 (four) times daily.    Yes Historical Provider, MD  cloNIDine (CATAPRES - DOSED IN MG/24 HR) 0.1 mg/24hr patch Place 1 patch onto the skin once a week. Changes on friday   Yes Historical Provider, MD  Coenzyme Q10 150 MG CAPS Take 300 mg by mouth daily. Hold while in hospital   Yes Historical Provider, MD  docusate sodium (COLACE) 100 MG capsule Take 200 mg by mouth at bedtime.     Yes Historical Provider, MD  hydrALAZINE (APRESOLINE) 25 MG tablet Take 50 mg by mouth 3 (three) times daily.  07/18/11  Yes [name], MD  l-methylfolate-B6-B12 (METANX) 3-35-2 MG TABS Take 1 tablet by mouth 2 (two) times daily.     Yes Historical Provider, MD  losartan (COZAAR) 50 MG tablet Take 100 mg by mouth at bedtime.    Yes Historical Provider, MD  metoprolol succinate (TOPROL-XL) 50 MG 24 hr tablet Take 100 mg by mouth 2 (two) times daily. Takes after dialysis (T, Th, Sat)   Yes Historical Provider, MD  multivitamin (RENA-VIT) TABS tablet Take 1 tablet by mouth daily.     Yes Historical Provider, MD  repaglinide (PRANDIN) 0.5 MG tablet Take 0.5 mg by mouth 3 (three) times daily before meals.    Yes Historical Provider, MD   Allergies Allergies  Allergen Reactions  . Codeine Other (See Comments)    Passes out  . Epinephrine Other (See Comments)    Tachycardia, diaphoresis, syncope  . Percocet (Oxycodone-Acetaminophen) Other (See Comments)    Passes  out   Family History Family History  Problem Relation Age of Onset  . COPD Mother   . Kidney disease Mother   . Heart disease Father   . Lymphoma Son    Social History  reports that she has been smoking Cigarettes.  She has a 50 pack-year smoking history. She has never used smokeless tobacco. She reports that she does not drink alcohol or use illicit drugs.  Review Of Systems:  Unable to provide  Brief patient description:  66 yo ESRD on HD admitted on 7/29 with dyspnea and transferred to ICU due to acute respiratory distress requiring intubation.  In ED:  BiPAP started, given Vancomycin / Zosyn.  Emergent HD upon admission.  Troponin elevated.  Events Since Admission: 7/29  Admitted for SOB, BiPAP started, Vancomycin / Zosyn given, emergent HD 7/30  Transferred to 2300 with respiratory distress, hypertensive, intubated  Current Status:  Vital Signs: Temp:  [94.1 F (34.5 C)-97.9 F (36.6 C)] 97.7 F (36.5 C) (07/29 2001) Pulse Rate:  [61-84] 84  (07/30 0330) Resp:  [12-35] 35  (07/30 0300) BP: (145-218)/(61-85) 196/65 mmHg (07/30 0330) SpO2:  [92 %-100 %] 94 % (07/30 0300) FiO2 (%):  [50 %-80 %] 80 % (07/30 0315) Weight:  [58.5 kg (128 lb 15.5  oz)] 58.5 kg (128 lb 15.5 oz) (07/29 2001)  Physical Examination: General: Synchronous, no distress Neuro:  Sedated HEENT:  PERRL Neck:  JVD Cardiovascular:  RRR, tachycardia  Lungs:  Bibasilar rales, rhonchi, wheezing Abdomen:  Soft, nontender, nondistended, bowel sounds present Musculoskeletal:  Moves all extremities Skin:  Intact  Active Problems:  Hemodialysis patient  Hypertensive CKD, ESRD on dialysis  ESRD on hemodialysis  Diabetes mellitus with end stage renal disease  Acute respiratory failure  Leukocytosis  Troponin level elevated  ASSESSMENT AND PLAN  PULMONARY No results found for this basename: PHART:5,PCO2:5,PCO2ART:5,PO2ART:5,HCO3:5,O2SAT:5 in the last 168 hours  Ventilator Settings: Vent Mode:  [-]  FiO2 (%):  [50 %-80 %] 80 %  CXR:  7/30 >>> Well positioned hardware, bilateral airspace disease ETT:  7/30 >>>  A:  Acute respiratory failure. Possible HCAP.  Possible aspiration.  Possible acute pulmonary edema secondary to congestive heart failure, hypertensive emergency, fluid overload.  P:   Full mechanical support Albuterol / Atrovent ABG CXR Daily SBT  CARDIOVASCULAR  Lab 10/11/11 2141 10/11/11 0845  TROPONINI 1.27* 0.82*  LATICACIDVEN -- 1.9  PROBNP -- --   ECG:  7/29 >>> NSr, nonspecific ST-T changes Lines:  R tunneled HD cath ??? >>>  A: Suspected acute congestive heart failure.  Rising troponin, demand ischemia vs NSTEMI.  Hypertensive emergency, not clear if primary or secondary to respiratory distress. P:  Trend cardiac enzymes TTE to evaluate EF and regional wall motion abnormalities Heparin gtt for ACS x 48 hrs Continue ASA, Norvasc, Hydralazine, Losartan, Metoprolol Goal SBP < 180  RENAL  Lab 10/11/11 2141 10/11/11 0903 10/11/11 0845 10/08/11 2221 10/06/11 2359  NA -- 136 137 138 142  K -- 4.8 4.9 -- --  CL -- 102 96 96 100  CO2 -- -- 25 28 27   BUN -- 61* 62* 53* 71*  CREATININE 3.48* 7.00* 7.65* 7.11* 7.69*  CALCIUM --  -- 9.8 9.1 9.4  MG -- -- -- -- --  PHOS -- -- -- 3.4 2.9   Intake/Output      07/29 0701 - 07/30 0700   Other 1707   Total Output 1707   Net -1707        Foley:  NA  A:  ESRD on HD.  History of medical noncompliance. P:   HD per Renal Zemplar Trend BMP  GASTROINTESTINAL  Lab 10/08/11 2221 10/06/11 2359  AST -- --  ALT -- --  ALKPHOS -- --  BILITOT -- --  PROT -- --  ALBUMIN 3.3* 3.3*   A:  NPO after intubation P:   Start TF if intubated > 24 h  HEMATOLOGIC  Lab 10/11/11 2141 10/11/11 0903 10/11/11  0845 10/08/11 2221 10/06/11 2359  HGB 8.3* 11.2* 9.4* 8.7* 8.6*  HCT 26.6* 33.0* 30.3* 27.1* 27.4*  PLT 170 -- 179 161 155  INR -- -- -- -- --  APTT -- -- -- -- --   A:  Anemia.  No overt hemorrhage. P:  Trend CBC  INFECTIOUS  Lab 10/11/11 2141 10/11/11 0845 10/08/11 2221 10/06/11 2359  WBC 7.8 12.7* 5.9 7.3  PROCALCITON -- -- -- --   Cultures: 7/30  Respiratory >>>  Antibiotics: Vancomycin 7/29 >>> Zosyn 7/29 >>>  A:  Suspected HCAP / aspiration pneumonia. P:   Cultures / Antibiotics as above PCT  ENDOCRINE  Lab 10/12/11 0233 10/12/11 0120 10/12/11 0040 10/11/11 1958 10/11/11 1839  GLUCAP 137* 107* 48* 102* 106*   A:  Hypoglycemia.  Hyperglycemia.  DM. P:   CBG/SSI  NEUROLOGIC  A:  Acute encephalopathy, multifactorial. P:   Propofol gtt for goal RASS 0 to -1 Daily WUA  BEST PRACTICE / DISPOSITION Level of Care:  ICU Primary Service:  PCCM, back to [facility] when out of ICU Consultants:  Renal Code Status:  Full Diet:  NPO DVT Px:  Not indicated (on Heparin gtt) GI Px:  Protonix Skin Integrity:  Intact  Social / Family:  Update daughter Quincy Simmonds (OB/GYN MD in [place])  The patient is critically ill with multiple organ systems failure and requires high complexity decision making for assessment and support, frequent evaluation and titration of therapies, application of advanced monitoring technologies and extensive interpretation of  multiple databases. Critical Care Time devoted to patient care services described in this note is 45 minutes.  Lonia Farber, M.D. Pulmonary and Critical Care Medicine [facility] Pager: [phone]  10/12/2011, 4:05 AM

## 2011-10-12 NOTE — Plan of Care (Signed)
Problem: ICU Phase Progression Outcomes Goal: O2 sats trending toward baseline Outcome: Progressing Now on 40% FiO2, and weaning on CPAP/PS Goal: Hemodynamically stable Outcome: Progressing Blood pressure ranging 120-201 systolic

## 2011-10-13 ENCOUNTER — Inpatient Hospital Stay (HOSPITAL_COMMUNITY): Payer: Medicare HMO

## 2011-10-13 LAB — GLUCOSE, CAPILLARY
Glucose-Capillary: 147 mg/dL — ABNORMAL HIGH (ref 70–99)
Glucose-Capillary: 97 mg/dL (ref 70–99)
Glucose-Capillary: 74 mg/dL (ref 70–99)
Glucose-Capillary: 85 mg/dL (ref 70–99)
Glucose-Capillary: 76 mg/dL (ref 70–99)

## 2011-10-13 LAB — CBC WITH DIFFERENTIAL/PLATELET
Basophils Absolute: 0.1 10*3/uL (ref 0.0–0.1)
Eosinophils Absolute: 0.2 10*3/uL (ref 0.0–0.7)
HCT: 25.1 % — ABNORMAL LOW (ref 36.0–46.0)
Lymphocytes Relative: 30 % (ref 12–46)
MCHC: 31.1 g/dL (ref 30.0–36.0)
Neutro Abs: 3.6 10*3/uL (ref 1.7–7.7)
Platelets: 133 10*3/uL — ABNORMAL LOW (ref 150–400)
RDW: 23.3 % — ABNORMAL HIGH (ref 11.5–15.5)

## 2011-10-13 LAB — CARDIAC PANEL(CRET KIN+CKTOT+MB+TROPI)
CK, MB: 5.9 ng/mL — ABNORMAL HIGH (ref 0.3–4.0)
Total CK: 306 U/L — ABNORMAL HIGH (ref 7–177)
Troponin I: 0.87 ng/mL (ref <0.30)

## 2011-10-13 LAB — COMPREHENSIVE METABOLIC PANEL
ALT: 32 U/L (ref 0–35)
Alkaline Phosphatase: 98 U/L (ref 39–117)
BUN: 11 mg/dL (ref 6–23)
CO2: 32 mEq/L (ref 19–32)
Chloride: 98 mEq/L (ref 96–112)
GFR calc Af Amer: 21 mL/min — ABNORMAL LOW (ref >90)
GFR calc non Af Amer: 18 mL/min — ABNORMAL LOW (ref >90)
Glucose, Bld: 81 mg/dL (ref 70–99)
Potassium: 4.6 mEq/L (ref 3.5–5.1)
Sodium: 139 mEq/L (ref 135–145)
Total Bilirubin: 0.5 mg/dL (ref 0.3–1.2)
Total Protein: 6.8 g/dL (ref 6.0–8.3)

## 2011-10-13 LAB — LIPID PANEL
Cholesterol: 123 mg/dL (ref 0–200)
LDL Cholesterol: 50 mg/dL (ref 0–99)
VLDL: 13 mg/dL (ref 0–40)

## 2011-10-13 MED ORDER — DOCUSATE SODIUM 50 MG/5ML PO LIQD: 100.0000 mg | Freq: Every day | ORAL | Status: DC

## 2011-10-13 MED ORDER — EPOETIN ALFA 10000 UNIT/ML IJ SOLN
10000.0000 [IU] | INTRAMUSCULAR | Status: DC
  Administered 2011-10-13 – 2011-10-20 (×4): 10000 [IU] via INTRAVENOUS
  Filled 2011-10-13 (×8): qty 1

## 2011-10-13 MED ORDER — VANCOMYCIN HCL 1000 MG IV SOLR
750.0000 mg | INTRAVENOUS | Status: DC
  Administered 2011-10-13: 750 mg via INTRAVENOUS
  Filled 2011-10-13: qty 750

## 2011-10-13 MED ORDER — PARICALCITOL 5 MCG/ML IV SOLN
1.0000 ug | Freq: Once | INTRAVENOUS | Status: AC
  Administered 2011-10-13: 1 ug via INTRAVENOUS
  Filled 2011-10-13: qty 0.2

## 2011-10-13 MED ORDER — PARICALCITOL 5 MCG/ML IV SOLN
1.0000 ug | INTRAVENOUS | Status: DC
  Filled 2011-10-13: qty 0.2

## 2011-10-13 MED ORDER — DEXTROSE 50 % IV SOLN
25.0000 mL | Freq: Once | INTRAVENOUS | Status: AC | PRN
  Administered 2011-10-14: 25 mL via INTRAVENOUS

## 2011-10-13 MED ORDER — ATORVASTATIN CALCIUM 20 MG PO TABS
20.0000 mg | ORAL_TABLET | Freq: Every day | ORAL | Status: DC
  Administered 2011-10-13 – 2011-10-17 (×3): 20 mg
  Filled 2011-10-13 (×9): qty 1

## 2011-10-13 MED ORDER — BISACODYL 10 MG RE SUPP
10.0000 mg | Freq: Once | RECTAL | Status: AC
  Administered 2011-10-13: 10 mg via RECTAL
  Filled 2011-10-13: qty 1

## 2011-10-13 MED ORDER — HYDRALAZINE HCL 25 MG PO TABS
25.0000 mg | ORAL_TABLET | Freq: Three times a day (TID) | ORAL | Status: DC
  Administered 2011-10-13 – 2011-10-16 (×6): 25 mg via ORAL
  Filled 2011-10-13 (×12): qty 1

## 2011-10-13 MED ORDER — DEXTROSE 50 % IV SOLN: 50.0000 mL | Freq: Once | INTRAVENOUS | Status: AC | PRN

## 2011-10-13 MED ORDER — HEPARIN SODIUM (PORCINE) 1000 UNIT/ML DIALYSIS
20.0000 [IU]/kg | INTRAMUSCULAR | Status: DC | PRN
  Filled 2011-10-13: qty 2

## 2011-10-13 MED ORDER — EPOETIN ALFA 10000 UNIT/ML IJ SOLN: 10000.0000 [IU] | INTRAMUSCULAR | Status: DC

## 2011-10-13 MED FILL — Chlorhexidine Gluconate Soln 0.12%: OROMUCOSAL | Qty: 15 | Status: AC

## 2011-10-13 NOTE — Consult Note (Signed)
  Pt. Still intubated. Chest x ray still some pulmonary edema. Today is her regular dialysis day. Will write dialysis orders. Post wt. Was 56.3kg.

## 2011-10-13 NOTE — Progress Notes (Addendum)
ANTICOAGULATION CONSULT NOTE - Follow Up Consult  Pharmacy Consult for Heparin Indication: chest pain/ACS  Allergies  Allergen Reactions  . Codeine Other (See Comments)    Passes out  . Epinephrine Other (See Comments)    Tachycardia, diaphoresis, syncope  . Percocet (Oxycodone-Acetaminophen) Other (See Comments)    Passes  out    Patient Measurements: Height: 5' 8.11" (173 cm) Weight: 128 lb 12 oz (58.4 kg) IBW/kg (Calculated) : 64.15  Heparin Dosing Weight: 56.1 Kg  Vital Signs: Temp: 99.3 F (37.4 C) (07/31 1315) Temp src: Oral (07/31 1315) BP: 141/45 mmHg (07/31 1500) Pulse Rate: 72  (07/31 1500)  Labs:  Basename 10/13/11 1400 10/13/11 1213 10/13/11 0310 10/13/11 0305 10/12/11 1829 10/12/11 1805 10/12/11 1115 10/11/11 2141  HGB -- -- 7.8* -- -- -- 8.1* --  HCT -- -- 25.1* -- -- -- 25.6* 26.6*  PLT -- -- 133* -- -- -- 172 170  APTT -- -- -- -- -- -- -- --  LABPROT -- -- -- -- -- -- -- --  INR -- -- -- -- -- -- -- --  HEPARINUNFRC 0.48 -- 0.25* -- 0.61 -- -- --  CREATININE -- -- 2.56* -- -- -- 5.17* 3.48*  CKTOTAL -- 915* -- 306* -- 258* -- --  CKMB -- 5.9* -- 4.3* -- 5.8* -- --  TROPONINI -- 0.87* -- 0.90* -- 0.68* -- --    Estimated Creatinine Clearance: 19.4 ml/min (by C-G formula based on Cr of 2.56).  Assessment: 66 yo female with NSTEMI , on IV  Heparin per pharmacy protocol.  Heparin level = 0.48,  ~8 hours after IV heparin rate increased to 850 units/hr. Therapeutic heparin level. No bleeding reported.   Goal of Therapy:  Heparin level 0.3-0.7 units/ml Monitor platelets by anticoagulation protocol: Yes   Plan:  Continue IV Heparin infusion at 850 units/hr Check heparin level in the AM and daily.    Noah Delaine, RPh Clinical Pharmacist Pager: [phone] 10/13/2011 3:15 PM

## 2011-10-13 NOTE — Progress Notes (Signed)
Subjective:  Patient remains intubated on propofol. 2-D echo showed normal systolic function with a diastolic dysfunction with no wall motion abnormalities cardiac enzymes are minimally elevated due to a combination of demand ischemia and renal failure doubt significant MI  Objective:  Vital Signs in the last 24 hours: Temp:  [97.6 F (36.4 C)-100 F (37.8 C)] 98.9 F (37.2 C) (07/31 1154) Pulse Rate:  [63-91] 79  (07/31 1136) Resp:  [12-33] 25  (07/31 1136) BP: (77-203)/(33-79) 145/74 mmHg (07/31 1136) SpO2:  [100 %] 100 % (07/31 1136) FiO2 (%):  [40 %] 40 % (07/31 1137) Weight:  [56.1 kg (123 lb 10.9 oz)-56.3 kg (124 lb 1.9 oz)] 56.3 kg (124 lb 1.9 oz) (07/31 0500)  Intake/Output from previous day: 07/30 0701 - 07/31 0700 In: 1602.3 [I.V.:827.3; NG/GT:625; IV Piggyback:150] Out: 1622 [Emesis/NG output:250] Intake/Output from this shift: Total I/O In: 281.3 [I.V.:106.3; NG/GT:175] Out: -   Physical Exam: Neck: no adenopathy, no carotid bruit, no JVD and supple, symmetrical, trachea midline Lungs: Decreased breath sound at bases with occasional rhonchi and rales Heart: regular rate and rhythm, S1, S2 normal and Soft systolic murmur and S3 gallop noted Abdomen: Soft bowel sounds present Extremities: extremities normal, atraumatic, no cyanosis or edema  Lab Results:  Basename 10/13/11 0310 10/12/11 1115  WBC 7.6 10.6*  HGB 7.8* 8.1*  PLT 133* 172    Basename 10/13/11 0310 10/12/11 1115  NA 139 140  K 4.6 3.8  CL 98 96  CO2 32 34*  GLUCOSE 81 85  BUN 11 33*  CREATININE 2.56* 5.17*    Basename 10/13/11 0305 10/12/11 1805  TROPONINI 0.90* 0.68*   Hepatic Function Panel  Basename 10/13/11 0310  PROT 6.8  ALBUMIN 3.6  AST 39*  ALT 32  ALKPHOS 98  BILITOT 0.5  BILIDIR --  IBILI --    Basename 10/13/11 0310  CHOL 123   No results found for this basename: PROTIME in the last 72 hours  Imaging: Imaging results have been reviewed and Dg Chest 1  View  10/11/2011  *RADIOLOGY REPORT*  Clinical Data: Shortness of breath.  Status post dialysis.  CHEST - 1 VIEW  Comparison: Earlier today.  Findings: The pulmonary vasculature and interstitial markings are less prominent.  The cardiac silhouette remains borderline enlarged.  Stable right jugular double-lumen catheter and thoracic spine degenerative changes.  IMPRESSION: Improving changes of congestive heart failure.  Original Report Authenticated By: Darrol Angel, M.D.   Dg Chest Port 1 View  10/13/2011  *RADIOLOGY REPORT*  Clinical Data: End-stage renal disease, edema, ventilatory support  PORTABLE CHEST - 1 VIEW  Comparison: 10/12/2011  Findings: Stable support apparatus.  Mild vascular and interstitial prominence compatible with interstitial edema.  Basilar atelectasis evident.  Small effusions suspected.  No pneumothorax demonstrated.  IMPRESSION: Stable mild edema pattern.  Original Report Authenticated By: Judie Petit. Ruel Favors, M.D.   Dg Chest Port 1 View  10/12/2011  *RADIOLOGY REPORT*  Clinical Data: Assess endotracheal tube.  PORTABLE CHEST - 1 VIEW  Comparison: 10/11/2011  Findings: Endotracheal tube tip 4.7 cm proximal to the carina. Bibasilar opacities.  Cardiomediastinal contours unchanged.  No definite pneumothorax.  Small effusions not excluded.  Right IJ catheter tip projects over the mid SVC.  IMPRESSION: Endotracheal tube tip 4.7 cm proximal to the carina.  Bibasilar opacities; atelectasis, aspiration, or infiltrate.  Original Report Authenticated By: Waneta Martins, M.D.   Dg Abd Portable 1v  10/12/2011  *RADIOLOGY REPORT*  Clinical Data: Rule out ileus.  History of renal failure.  PORTABLE ABDOMEN - 1 VIEW  Comparison: None.  Findings: NG tube tip is in the antrum of the stomach.  Moderate stool in the ascending colon.  No disproportionate dilatation of small bowel.  No obvious free intraperitoneal gas.  IMPRESSION: NG tube tip in antrum of the stomach.  Moderate stool in the  ascending colon.  Nonobstructive bowel gas pattern.  Original Report Authenticated By: Donavan Burnet, M.D.    Cardiac Studies:  Assessment/Plan:  Probable very small non-Q-wave myocardial infarction secondary to demand ischemia rule out coronary insufficiency doubt significant MI in view of normal LV systolic function and wall motion Status post hypertensive emergency  Resolving acute pulmonary edema secondary to volume overload/diastolic dysfunction  Probable aspiration pneumonia Diabetes mellitus  End-stage renal disease on hemodialysis  Hypercholesteremia  History of gouty arthritis  Acute on chronic anemia rule out GI loss  Plan  Continue present management per CCM and renal service We'll schedule for nuclear stress test once extubated and stable evaluate for ischemia  LOS: 2 days    Ziva Nunziata N 10/13/2011, 12:02 PM

## 2011-10-13 NOTE — Progress Notes (Signed)
Name: Alexandra [name] MRN: [id number] DOB: 1945-09-24    LOS: 2  Referring Provider:  [facility] Reason for Referral:  Acute respiratory failure  PULMONARY / CRITICAL CARE MEDICINE  Brief patient description:  66 yo ESRD on HD admitted on 7/29 with dyspnea and transferred to ICU due to acute respiratory distress requiring intubation.  In ED:  BiPAP started, given Vancomycin / Zosyn.  Emergent HD upon admission.  Troponin elevated.  Events Since Admission: 7/29  Admitted for SOB, BiPAP started, Vancomycin / Zosyn given, emergent HD 7/30  Transferred to 2300 with respiratory distress, hypertensive, intubated 7/30 repeat HD tolerated  Current Status: BP more controlled  Vital Signs: Temp:  [97.6 F (36.4 C)-100 F (37.8 C)] 100 F (37.8 C) (07/31 0819) Pulse Rate:  [63-91] 87  (07/31 0800) Resp:  [12-33] 28  (07/31 0800) BP: (77-203)/(33-79) 173/55 mmHg (07/31 0800) SpO2:  [99 %-100 %] 100 % (07/31 0800) FiO2 (%):  [40 %] 40 % (07/31 0800) Weight:  [56.1 kg (123 lb 10.9 oz)-57.4 kg (126 lb 8.7 oz)] 56.3 kg (124 lb 1.9 oz) (07/31 0500)  Physical Examination: General: Synchronous, rass -2 Neuro:  Sedated, rass -2 HEENT:  PERRL 3 mm Neck:  JVD reduced Cardiovascular:  RRR, distant no m Lungs:  ronchi bilat improved Abdomen:  Soft, nontender, nondistended, bowel sounds present Musculoskeletal:  Moves all extremities Skin:  Intact  Active Problems:  Hemodialysis patient  Hypertensive CKD, ESRD on dialysis  ESRD on hemodialysis  Diabetes mellitus with end stage renal disease  Acute respiratory failure  Leukocytosis  Troponin level elevated  ASSESSMENT AND PLAN  PULMONARY  Lab 10/12/11 0450  PHART 7.342*  PCO2ART 65.8*  PO2ART 171.0*  HCO3 35.7*  O2SAT 99.0    Ventilator Settings: Vent Mode:  [-] CPAP;PSV FiO2 (%):  [40 %] 40 % Set Rate:  [12 bmp] 12 bmp Vt Set:  [400 mL] 400 mL PEEP:  [5 cmH20] 5 cmH20 Pressure Support:  [10 cmH20-12 cmH20] 10 cmH20  CXR:   7/31 >>> improved overall int prominance, still residual int changes bibasilr predom, ett wnl ETT:  7/30 >>>  A:  Acute respiratory failure. Possible HCAP.  Possible aspiration.  Possible acute pulmonary edema secondary to congestive heart failure, hypertensive emergency, fluid overload.  P:   Full mechanical support, abg reviewed wean cpap 5 ps 5, required escallation of pS now to 10 , reduce sedation further and observe PS needs, hope to be able to re reduce Control Afterload improved Albuterol / Atrovent Neg balance goal obtained, appreciate renals repeat HD, also helped afterload pcxr in am  WUA  CARDIOVASCULAR  Lab 10/13/11 0305 10/12/11 1805 10/12/11 1115 10/11/11 2141 10/11/11 0845  TROPONINI 0.90* 0.68* 1.27* 1.27* 0.82*  LATICACIDVEN -- -- -- -- 1.9  PROBNP -- -- -- -- --   ECG:  7/29 >>> NSr, nonspecific ST-T changes Lines:  R tunneled HD cath ??? >>>  A: Suspected acute congestive heart failure.  Rising troponin, demand ischemia vs NSTEMI.  Hypertensive emergency P:  Trend cardiac enzymes reviewed TTE to evaluate EF pending read Heparin gtt for ACS x 48 hrs, will re eval needs with cardiologists in am  Continue ASA, Norvasc, Hydralazine, Losartan, Metoprolol Add home clonidine for rebound MAP now 60, may need to reduce HTN meds vs reduce propofol Statin now as OGT in , lft in am   RENAL  Lab 10/13/11 0310 10/12/11 1115 10/11/11 2141 10/11/11 0903 10/11/11 0845 10/08/11 2221 10/06/11 2359  NA 139 140 --  136 137 138 --  K 4.6 3.8 -- -- -- -- --  CL 98 96 -- 102 96 96 --  CO2 32 34* -- -- 25 28 27   BUN 11 33* -- 61* 62* 53* --  CREATININE 2.56* 5.17* 3.48* 7.00* 7.65* -- --  CALCIUM 9.4 9.1 -- -- 9.8 9.1 9.4  MG -- -- -- -- -- -- --  PHOS -- 3.8 -- -- -- 3.4 2.9   Intake/Output      07/30 0701 - 07/31 0700 07/31 0701 - 08/01 0700   I.V. (mL/kg) 827.3 (14.7) 39.3 (0.7)   NG/GT 625 45   IV Piggyback 150    Total Intake(mL/kg) 1602.3 (28.5) 84.3 (1.5)    Emesis/NG output 250    Other 1372    Total Output 1622    Net -19.7 +84.3         Foley:  NA  A:  ESRD on HD.  History of medical noncompliance. P:   HD per Renal bmet in am  excellent neg balance, improved with this   GASTROINTESTINAL  Lab 10/13/11 0310 10/12/11 1115 10/08/11 2221 10/06/11 2359  AST 39* -- -- --  ALT 32 -- -- --  ALKPHOS 98 -- -- --  BILITOT 0.5 -- -- --  PROT 6.8 -- -- --  ALBUMIN 3.6 3.1* 3.3* 3.3*   A:  Malnourished, constipated P:   Start TF, hold if weaning successful  ppi kub reviewed dulc supp colace  HEMATOLOGIC  Lab 10/13/11 0310 10/12/11 1115 10/11/11 2141 10/11/11 0903 10/11/11 0845 10/08/11 2221  HGB 7.8* 8.1* 8.3* 11.2* 9.4* --  HCT 25.1* 25.6* 26.6* 33.0* 30.3* --  PLT 133* 172 170 -- 179 161  INR -- -- -- -- -- --  APTT -- -- -- -- -- --   A:  Anemia.  No overt hemorrhage. P:  Sub q hep Cbc in am   INFECTIOUS  Lab 10/13/11 0310 10/12/11 1115 10/11/11 2141 10/11/11 0845 10/08/11 2221  WBC 7.6 10.6* 7.8 12.7* 5.9  PROCALCITON -- 23.02 -- -- --   Cultures: 7/30  Respiratory >>>  Antibiotics: Vancomycin 7/29 >>>7/31 Zosyn 7/29 >>>  A:  Suspected HCAP / aspiration pneumonia. P:   PCT trend to de escalate, repeat in am  Dc vanc as no mrsa noted Will  Narrow off zosyn in am if able  ENDOCRINE  Lab 10/13/11 0814 10/13/11 0402 10/13/11 0011 10/12/11 2014 10/12/11 1552  GLUCAP 96 74 91 97 97   A:  Hypoglycemia.  Hyperglycemia.  DM. P:   CBG/SSI  NEUROLOGIC  A:  Acute encephalopathy, multifactorial. P:   Propofol gtt for goal RASS 0 to -1 Daily WUA Chair position  BEST PRACTICE / DISPOSITION Level of Care:  ICU Primary Service:  PCCM Consultants:  Renal, cards Code Status:  Full Diet:  TF 7/30 DVT Px:  Not indicated (on Heparin gtt) GI Px:  Protonix Skin Integrity:  Intact  Social / Family:  Update daughter Quincy Simmonds (OB/GYN MD in [place]), attemoted to call 7/30, no voice mail set up on that  number, attemptgin additional numbers today  The patient is critically ill with multiple organ systems failure and requires high complexity decision making for assessment and support, frequent evaluation and titration of therapies, application of advanced monitoring technologies and extensive interpretation of multiple databases. Critical Care Time devoted to patient care services described in this note is 35 minutes.  Nelda Bucks., M.D. Pulmonary and Critical Care Medicine [facility] Pager: [phone])  [phone]  10/13/2011, 9:07 AM

## 2011-10-13 NOTE — Significant Event (Signed)
1200pm-patient's daughter friend Burna Cash [phone]) took all of patient rings home per request of patient's daughter. RN spoken with patient's daughter to verify. Pt daughter cited correct information and gave code word. Neoma Uhrich, Charity fundraiser.

## 2011-10-13 NOTE — Progress Notes (Signed)
Patient has received a brief initial consultation at home.  She did not want to sign consents at that time.  Currently she has a Airline pilot focusing on stabilizing her nutrition as she has been anorexic and progressively frail.  Spoke with Wandra Mannan LCSW at bedside today.  Patient is being considered for outpatient dialysis with [facility].  I have requested authorization for transportation support to these treatments.  Will notify LCSW of results.  Notified [facility] Liaison of pt admission.For any additional questions or new referrals please contact Anibal Henderson BSN RN [facility] Liaison at [phone].

## 2011-10-13 NOTE — Progress Notes (Signed)
ANTICOAGULATION CONSULT NOTE - Follow Up Consult  Pharmacy Consult for Heparin Indication: chest pain/ACS  Allergies  Allergen Reactions  . Codeine Other (See Comments)    Passes out  . Epinephrine Other (See Comments)    Tachycardia, diaphoresis, syncope  . Percocet (Oxycodone-Acetaminophen) Other (See Comments)    Passes  out    Patient Measurements: Height: 5' 8.11" (173 cm) Weight: 123 lb 10.9 oz (56.1 kg) IBW/kg (Calculated) : 64.15  Heparin Dosing Weight: 56.1 Kg  Vital Signs: Temp: 99.4 F (37.4 C) (07/31 0357) Temp src: Oral (07/31 0357) BP: 151/44 mmHg (07/31 0357) Pulse Rate: 81  (07/31 0357)  Labs:  Alvira Philips 10/13/11 0310 10/13/11 0305 10/12/11 1829 10/12/11 1805 10/12/11 1115 10/11/11 2141  HGB 7.8* -- -- -- 8.1* --  HCT 25.1* -- -- -- 25.6* 26.6*  PLT 133* -- -- -- 172 170  APTT -- -- -- -- -- --  LABPROT -- -- -- -- -- --  INR -- -- -- -- -- --  HEPARINUNFRC 0.25* -- 0.61 -- -- --  CREATININE 2.56* -- -- -- 5.17* 3.48*  CKTOTAL -- 306* -- 258* 280* --  CKMB -- 4.3* -- 5.8* 7.2* --  TROPONINI -- 0.90* -- 0.68* 1.27* --    Estimated Creatinine Clearance: 18.6 ml/min (by C-G formula based on Cr of 2.56).  Assessment: 66 yo female with NSTEMI for Heparin  Goal of Therapy:  Heparin level 0.3-0.7 units/ml Monitor platelets by anticoagulation protocol: Yes   Plan:  Increase Heparin 850 units/hr Check heparin level in 8 hours.  Geannie Risen, PharmD, BCPS  10/13/2011 5:13 AM

## 2011-10-14 ENCOUNTER — Inpatient Hospital Stay (HOSPITAL_COMMUNITY): Payer: Medicare HMO

## 2011-10-14 DIAGNOSIS — I10 Essential (primary) hypertension: Secondary | ICD-10-CM

## 2011-10-14 DIAGNOSIS — N189 Chronic kidney disease, unspecified: Secondary | ICD-10-CM

## 2011-10-14 LAB — GLUCOSE, CAPILLARY
Glucose-Capillary: 87 mg/dL (ref 70–99)
Glucose-Capillary: 70 mg/dL (ref 70–99)
Glucose-Capillary: 126 mg/dL — ABNORMAL HIGH (ref 70–99)
Glucose-Capillary: 115 mg/dL — ABNORMAL HIGH (ref 70–99)

## 2011-10-14 LAB — COMPREHENSIVE METABOLIC PANEL
ALT: 39 U/L — ABNORMAL HIGH (ref 0–35)
CO2: 27 mEq/L (ref 19–32)
Calcium: 9.6 mg/dL (ref 8.4–10.5)
Chloride: 93 mEq/L — ABNORMAL LOW (ref 96–112)
Creatinine, Ser: 2.78 mg/dL — ABNORMAL HIGH (ref 0.50–1.10)
GFR calc Af Amer: 19 mL/min — ABNORMAL LOW (ref >90)
GFR calc non Af Amer: 16 mL/min — ABNORMAL LOW (ref >90)
Glucose, Bld: 94 mg/dL (ref 70–99)
Total Bilirubin: 0.4 mg/dL (ref 0.3–1.2)

## 2011-10-14 LAB — CBC WITH DIFFERENTIAL/PLATELET
Basophils Relative: 1 % (ref 0–1)
Eosinophils Absolute: 0.3 10*3/uL (ref 0.0–0.7)
MCH: 22.8 pg — ABNORMAL LOW (ref 26.0–34.0)
MCHC: 30.3 g/dL (ref 30.0–36.0)
Neutrophils Relative %: 50 % (ref 43–77)
Platelets: 145 10*3/uL — ABNORMAL LOW (ref 150–400)
RDW: 22.9 % — ABNORMAL HIGH (ref 11.5–15.5)

## 2011-10-14 LAB — CULTURE, RESPIRATORY W GRAM STAIN: Gram Stain: NONE SEEN

## 2011-10-14 LAB — CARDIAC PANEL(CRET KIN+CKTOT+MB+TROPI): CK, MB: 6 ng/mL — ABNORMAL HIGH (ref 0.3–4.0)

## 2011-10-14 MED ORDER — HEPARIN SODIUM (PORCINE) 5000 UNIT/ML IJ SOLN
5000.0000 [IU] | Freq: Three times a day (TID) | INTRAMUSCULAR | Status: DC
  Administered 2011-10-15 – 2011-10-21 (×15): 5000 [IU] via SUBCUTANEOUS
  Filled 2011-10-14 (×24): qty 1

## 2011-10-14 MED ORDER — DEXTROSE 50 % IV SOLN
INTRAVENOUS | Status: AC
  Administered 2011-10-14: 25 mL via INTRAVENOUS
  Filled 2011-10-14: qty 50

## 2011-10-14 MED ORDER — DEXTROSE 50 % IV SOLN: 25.0000 mL | Freq: Once | INTRAVENOUS | Status: AC | PRN

## 2011-10-14 MED ORDER — HEPARIN SODIUM (PORCINE) 1000 UNIT/ML DIALYSIS
100.0000 [IU]/kg | INTRAMUSCULAR | Status: DC | PRN
  Filled 2011-10-14: qty 6

## 2011-10-14 MED ORDER — DEXTROSE 5 % IV SOLN
1.0000 g | INTRAVENOUS | Status: DC
  Administered 2011-10-14 – 2011-10-15 (×2): 1 g via INTRAVENOUS
  Filled 2011-10-14 (×3): qty 10

## 2011-10-14 MED ORDER — PARICALCITOL 5 MCG/ML IV SOLN
1.0000 ug | INTRAVENOUS | Status: DC
  Administered 2011-10-15 – 2011-10-20 (×3): 1 ug via INTRAVENOUS
  Filled 2011-10-14 (×3): qty 0.2

## 2011-10-14 MED ORDER — HYDRALAZINE HCL 20 MG/ML IJ SOLN
10.0000 mg | Freq: Once | INTRAMUSCULAR | Status: AC
  Administered 2011-10-14: 10 mg via INTRAVENOUS
  Filled 2011-10-14: qty 0.5

## 2011-10-14 MED ORDER — HEPARIN SODIUM (PORCINE) 1000 UNIT/ML DIALYSIS
20.0000 [IU]/kg | INTRAMUSCULAR | Status: DC | PRN
  Filled 2011-10-14: qty 2

## 2011-10-14 NOTE — Progress Notes (Signed)
Nutrition Follow-up  Intervention:  1. Diet consistency per MD/SLP 2. If pt unable to advance to diet, recommend resuming nutrition support. If enteral nutrition warranted, initiate Nepro at 20 ml/hr via enteral feeding tube and advance by 10 ml/hr every 4 hours, to goal of 40 ml/hr. This will provide: 1728 kcal, 78 grams protein, 698 ml free water. 3. RD to continue to follow nutrition care plan  Assessment:   Pt was extubated this morning. RN reports TF and propofol currently off. Noted that RN reports pt is not alert enough yet for swallow evaluation.  Diet Order:  NPO  Meds: Scheduled Meds:   . albuterol-ipratropium  6 puff Inhalation Q4H  . amLODipine  10 mg Oral Daily  . antiseptic oral rinse  15 mL Mouth Rinse QID  . aspirin  324 mg Oral Once  . atorvastatin  20 mg Per Tube q1800  . calcium acetate  667 mg Oral QID  . cefTRIAXone (ROCEPHIN)  IV  1 g Intravenous Q24H  . chlorhexidine  15 mL Mouth/Throat BID  . cloNIDine  0.1 mg Transdermal Weekly  . docusate sodium  200 mg Oral QHS  . epoetin (EPOGEN/PROCRIT) injection  10,000 Units Intravenous Q M,W,F-HD  . heparin flush  20 Units Intracatheter Q12H  . heparin subcutaneous  5,000 Units Subcutaneous Q8H  . hydrALAZINE  10 mg Intravenous Once  . hydrALAZINE  25 mg Oral Q8H  . insulin aspart  0-15 Units Subcutaneous Q4H  . l-methylfolate-B6-B12  1 tablet Oral BID  . losartan  100 mg Oral QHS  . metoprolol succinate  100 mg Oral BID  . multivitamin  1 tablet Oral Daily  . pantoprazole (PROTONIX) IV  40 mg Intravenous QHS  . paricalcitol  1 mcg Intravenous Q T,Th,Sa-HD  . paricalcitol  1 mcg Intravenous Once in dialysis  . sodium chloride  10-40 mL Intracatheter Q12H  . sodium chloride  3 mL Intravenous Q12H  . DISCONTD: piperacillin-tazobactam (ZOSYN)  IV  2.25 g Intravenous Q8H  . DISCONTD: vancomycin  750 mg Intravenous Q M,W,F-HD   Continuous Infusions:   . sodium chloride 10 mL/hr at 10/13/11 2000  . feeding  supplement (NEPRO CARB STEADY) 1,000 mL (10/12/11 1451)  . propofol Stopped (10/14/11 0830)  . DISCONTD: heparin 850 Units/hr (10/13/11 2000)   PRN Meds:.acetaminophen, albuterol-ipratropium, dextrose, dextrose, heparin flush, heparin, heparin, hydrALAZINE, sodium chloride  Labs:  CMP     Component Value Date/Time   NA 135 10/14/2011 0700   K 3.7 10/14/2011 0700   CL 93* 10/14/2011 0700   CO2 27 10/14/2011 0700   GLUCOSE 94 10/14/2011 0700   BUN 12 10/14/2011 0700   CREATININE 2.78* 10/14/2011 0700   CALCIUM 9.6 10/14/2011 0700   CALCIUM 7.2* 08/22/2009 0440   PROT 7.4 10/14/2011 0700   ALBUMIN 3.7 10/14/2011 0700   AST 59* 10/14/2011 0700   ALT 39* 10/14/2011 0700   ALKPHOS 112 10/14/2011 0700   BILITOT 0.4 10/14/2011 0700   GFRNONAA 16* 10/14/2011 0700   GFRAA 19* 10/14/2011 0700   Phosphorus  Date/Time Value Range Status  10/12/2011 11:15 AM 3.8  2.3 - 4.6 mg/dL Final  1/61/0960 45:40 PM 3.4  2.3 - 4.6 mg/dL Final  9/81/1914 78:29 PM 2.9  2.3 - 4.6 mg/dL Final   CBG (last 3)   Basename 10/14/11 0749 10/14/11 0353 10/14/11 0019  GLUCAP 87 95 76     Intake/Output Summary (Last 24 hours) at 10/14/11 1153 Last data filed at 10/14/11 1100  Gross  per 24 hour  Intake 1395.62 ml  Output   3015 ml  Net -1619.38 ml  BM on 8/1  Weight Status:  54.9 kg s/p HD on 7/31 - trending down 58.1 kg s/p HD on 7/29  Body mass index is 18.71 kg/(m^2). WNL  Re-estimated needs:  1680 - 1960 kcal, 70 - 85 grams protien  Nutrition Dx:  Inadequate oral intake r/t inability to eat AEB NPO status. Ongoing.  Goal: Pt to meet >/= 90% of their estimated nutrition needs; not met  Monitor:  Weights, labs, diet advancement vs EN  Jarold Motto MS, RD, LDN Pager: [phone] After-hours pager: [phone]

## 2011-10-14 NOTE — Progress Notes (Signed)
Patient with poor venous access. Phlebotomist able to draw AM heparin level and possibly enough blood for CBC.  Phlebotomist unable to draw enough blood for CMET, stated that she will send another phlebotomist to attempt CMET draw.

## 2011-10-14 NOTE — Consult Note (Signed)
Pt. Extubated,but very drowsy .dialysis orders for tomorrow written. Last post weight was 54.8kg

## 2011-10-14 NOTE — Progress Notes (Signed)
Notified by bedside RN of extreme agitation and c/o SOB.  Pt put on a NRB, for slight improvement in status.  Dr Delton Coombes camera-ed into room to visually assess pt.  Will continue to monitor.

## 2011-10-14 NOTE — Progress Notes (Signed)
Subjective:  Patient awake intubated been weaned off on vent. Denies any chest pain  Objective:  Vital Signs in the last 24 hours: Temp:  [97.6 F (36.4 C)-99.3 F (37.4 C)] 97.7 F (36.5 C) (08/01 0755) Pulse Rate:  [62-91] 87  (08/01 1100) Resp:  [13-26] 19  (08/01 1100) BP: (140-197)/(41-139) 153/62 mmHg (08/01 1100) SpO2:  [99 %-100 %] 99 % (08/01 1100) FiO2 (%):  [40 %] 40 % (08/01 0901) Weight:  [54.8 kg (120 lb 13 oz)-58.4 kg (128 lb 12 oz)] 56 kg (123 lb 7.3 oz) (08/01 0500)  Intake/Output from previous day: 07/31 0701 - 08/01 0700 In: 1516.9 [I.V.:596.9; NG/GT:810; IV Piggyback:110] Out: 3015  Intake/Output from this shift: Total I/O In: 175 [I.V.:65; NG/GT:60; IV Piggyback:50] Out: -   Physical Exam: Neck: no adenopathy, no carotid bruit, no JVD and supple, symmetrical, trachea midline Lungs: Bilateral rhonchi and rales noted Heart: regular rate and rhythm, S1, S2 normal and Soft systolic murmur and S3 gallop noted Abdomen: soft, non-tender; bowel sounds normal; no masses,  no organomegaly Extremities: extremities normal, atraumatic, no cyanosis or edema  Lab Results:  [facility] 10/14/11 0450 10/13/11 0310  WBC 6.9 7.6  HGB 8.8* 7.8*  PLT 145* 133*    Basename 10/14/11 0700 10/13/11 0310  NA 135 139  K 3.7 4.6  CL 93* 98  CO2 27 32  GLUCOSE 94 81  BUN 12 11  CREATININE 2.78* 2.56*    Basename 10/14/11 0001 10/13/11 1213  TROPONINI 0.87* 0.87*   Hepatic Function Panel  Basename 10/14/11 0700  PROT 7.4  ALBUMIN 3.7  AST 59*  ALT 39*  ALKPHOS 112  BILITOT 0.4  BILIDIR --  IBILI --    Basename 10/13/11 0310  CHOL 123   No results found for this basename: PROTIME in the last 72 hours  Imaging: Imaging results have been reviewed and Dg Chest Port 1 View  10/14/2011  *RADIOLOGY REPORT*  Clinical Data: Assess pulmonary edema.  Hemodialysis with pulmonary congestion  PORTABLE CHEST - 1 VIEW  Comparison: 10/13/2011  Findings: Endotracheal tube is  4.5 cm above the level of the carina, stable in position.  Hemodialysis catheter is in place via a right subclavian approach with the tip located overlying the distal superior vena cava at the level of the superior cavoatrial junction.  A nasogastric tube is in place and the tip is below the level of the hemidiaphragms and not visualized on this exam.  Heart and mediastinal contours are stable.  There has been an interval increase in pulmonary vascular congestion, mild central peribronchial cuffing, fissural prominence and interstitial septal lines compatible with interval development of very mild interstitial edema.  Question small left pleural effusion. Scarring changes at the left lung base are seen with no overt congestive failure or focal infiltrates identified.  IMPRESSION: Interval development of mild interstitial edema pattern with new small left pleural effusion.  Original Report Authenticated By: Bertha Stakes, M.D.   Dg Chest Port 1 View  10/13/2011  *RADIOLOGY REPORT*  Clinical Data: Hemodialysis.  Pulmonary congestion.  PORTABLE CHEST - 1 VIEW  Comparison: 10/13/2011, 0708 hours.  Findings: Improved pulmonary vascular congestion and basilar edema. Cardiopericardial silhouette appears within normal limits. Endotracheal tube remains present with the tip about 5 cm from the carina.  Enteric tube and dialysis catheter unchanged.  IMPRESSION: Improved pulmonary aeration compatible with decreasing edema and vascular congestion after hemodialysis.  Stable support apparatus.  Original Report Authenticated By: Andreas Newport, M.D.  Dg Chest Port 1 View  10/13/2011  *RADIOLOGY REPORT*  Clinical Data: End-stage renal disease, edema, ventilatory support  PORTABLE CHEST - 1 VIEW  Comparison: 10/12/2011  Findings: Stable support apparatus.  Mild vascular and interstitial prominence compatible with interstitial edema.  Basilar atelectasis evident.  Small effusions suspected.  No pneumothorax demonstrated.   IMPRESSION: Stable mild edema pattern.  Original Report Authenticated By: Judie Petit. Ruel Favors, M.D.    Cardiac Studies:  Assessment/Plan:  Probable very small non-Q-wave myocardial infarction secondary to demand ischemia rule out coronary insufficiency doubt significant MI in view of normal LV systolic function and wall motion  Status post hypertensive emergency  Resolving acute pulmonary edema secondary to volume overload/diastolic dysfunction  Probable aspiration pneumonia  Diabetes mellitus  End-stage renal disease on hemodialysis  Hypercholesteremia  History of gouty arthritis  Acute on chronic anemia rule out GI loss  Plan Continue present management per CCM and renal service   LOS: 3 days    Fay Swider N 10/14/2011, 11:09 AM

## 2011-10-14 NOTE — Consult Note (Signed)
Pt. Continues to be sedated.

## 2011-10-14 NOTE — Progress Notes (Signed)
Name: Alexandra [name] MRN: [id number] DOB: 1945/09/08    LOS: 3  Referring Provider:  [facility] Reason for Referral:  Acute respiratory failure  PULMONARY / CRITICAL CARE MEDICINE  Brief patient description:  66 yo ESRD on HD admitted on 7/29 with dyspnea and transferred to ICU due to acute respiratory distress requiring intubation.  In ED:  BiPAP started, given Vancomycin / Zosyn.  Emergent HD upon admission.  Troponin elevated.  Events Since Admission: 7/29  Admitted for SOB, BiPAP started, Vancomycin / Zosyn given, emergent HD 7/30  Transferred to 2300 with respiratory distress, hypertensive, intubated 7/30 repeat HD tolerated 7/31- weaned to some degree  Current Status: Slight HTN this am   Vital Signs: Temp:  [97.6 F (36.4 C)-99.3 F (37.4 C)] 97.7 F (36.5 C) (08/01 0755) Pulse Rate:  [62-89] 71  (08/01 0901) Resp:  [13-26] 26  (08/01 0901) BP: (140-197)/(41-139) 164/46 mmHg (08/01 0901) SpO2:  [100 %] 100 % (08/01 0901) FiO2 (%):  [40 %] 40 % (08/01 0901) Weight:  [54.8 kg (120 lb 13 oz)-58.4 kg (128 lb 12 oz)] 56 kg (123 lb 7.3 oz) (08/01 0500)  Physical Examination: General: Synchronous, rass -1 Neuro:  Sedated, rass -1, not following commands, int agitated HEENT:  PERRL 2 mm Neck:  JVD reduced Cardiovascular:  RRR, distant no m Lungs:  coarse Abdomen:  Soft, nontender, nondistended, bowel sounds present Musculoskeletal:  Moves all extremities Skin:  Intact  Active Problems:  Hemodialysis patient  Hypertensive CKD, ESRD on dialysis  ESRD on hemodialysis  Diabetes mellitus with end stage renal disease  Acute respiratory failure  Leukocytosis  Troponin level elevated  ASSESSMENT AND PLAN  PULMONARY  Lab 10/12/11 0450  PHART 7.342*  PCO2ART 65.8*  PO2ART 171.0*  HCO3 35.7*  O2SAT 99.0    Ventilator Settings: Vent Mode:  [-] CPAP;PSV FiO2 (%):  [40 %] 40 % Set Rate:  [12 bmp] 12 bmp Vt Set:  [400 mL] 400 mL PEEP:  [5 cmH20] 5 cmH20 Pressure  Support:  [5 cmH20-10 cmH20] 5 cmH20 Plateau Pressure:  [13 cmH20-14 cmH20] 13 cmH20  CXR:  8/1- improved int prominance ETT:  7/30 >>>  A:  Acute respiratory failure. Possible HCAP.  Possible aspiration.  Possible acute pulmonary edema secondary to congestive heart failure, hypertensive emergency, fluid overload.  P:   Continue to control afterload, BP labile Weaning this am cpap 5 ps 5, goal 1 hr, would accept rr 35, tv 250 pcxr in am  bnp follow up CT head, see neuro  CARDIOVASCULAR  Lab 10/14/11 0001 10/13/11 1213 10/13/11 0305 10/12/11 1805 10/12/11 1115 10/11/11 0845  TROPONINI 0.87* 0.87* 0.90* 0.68* 1.27* --  LATICACIDVEN -- -- -- -- -- 1.9  PROBNP -- -- -- -- -- --   ECG:  7/29 >>> NSr, nonspecific ST-T changes Echo 7/31- 55%, no wall moition Lines:  R tunneled HD cath ??? >>>  A: Suspected acute congestive heart failure.  Rising troponin, demand ischemia vs NSTEMI.  Hypertensive emergency P:  Echo reviewed Asa, beta blocker Afterload reduction Statin Dc hep as echo neg Cards plans stress test in future  RENAL  Lab 10/14/11 0700 10/13/11 0310 10/12/11 1115 10/11/11 2141 10/11/11 0903 10/11/11 0845 10/08/11 2221  NA 135 139 140 -- 136 137 --  K 3.7 4.6 -- -- -- -- --  CL 93* 98 96 -- 102 96 --  CO2 27 32 34* -- -- 25 28  BUN 12 11 33* -- 61* 62* --  CREATININE  2.78* 2.56* 5.17* 3.48* 7.00* -- --  CALCIUM 9.6 9.4 9.1 -- -- 9.8 9.1  MG -- -- -- -- -- -- --  PHOS -- -- 3.8 -- -- -- 3.4   Intake/Output      07/31 0701 - 08/01 0700 08/01 0701 - 08/02 0700   I.V. (mL/kg) 596.9 (10.7) 24.7 (0.4)   NG/GT 810 45   IV Piggyback 110    Total Intake(mL/kg) 1516.9 (27.1) 69.7 (1.2)   Emesis/NG output     Other 3015    Total Output 3015    Net -1498.1 +69.7        Stool Occurrence 1 x     Foley:  NA  A:  ESRD on HD.  History of medical noncompliance. P:   HD per Renal In am planned  GASTROINTESTINAL  Lab 10/14/11 0700 10/13/11 0310 10/12/11 1115  10/08/11 2221  AST 59* 39* -- --  ALT 39* 32 -- --  ALKPHOS 112 98 -- --  BILITOT 0.4 0.5 -- --  PROT 7.4 6.8 -- --  ALBUMIN 3.7 3.6 3.1* 3.3*   A:  Malnourished, constipated P:   Tf tolerated ppi dulc supp colace  HEMATOLOGIC  Lab 10/14/11 0450 10/13/11 0310 10/12/11 1115 10/11/11 2141 10/11/11 0903 10/11/11 0845  HGB 8.8* 7.8* 8.1* 8.3* 11.2* --  HCT 29.0* 25.1* 25.6* 26.6* 33.0* --  PLT 145* 133* 172 170 -- 179  INR -- -- -- -- -- --  APTT -- -- -- -- -- --   A:  Anemia.  No overt hemorrhage. hemoconetrating P:  Sub q hep Cbc in am  Dc IV hep  INFECTIOUS  Lab 10/14/11 0450 10/13/11 0310 10/12/11 1115 10/11/11 2141 10/11/11 0845  WBC 6.9 7.6 10.6* 7.8 12.7*  PROCALCITON -- -- 23.02 -- --   Cultures: 7/30  Respiratory >>>  Antibiotics: Vancomycin 7/29 >>>7/31 Zosyn 7/29 >>>8/1 Ceftriaxone 8/1>>>plan 8/4  A:  Suspected HCAP / aspiration pneumonia. P:   PCT trend to de escalate, repeat in am  Dc vanc Remains culture neg, pct 23, dc zosyn, add ceftriaxone to duration7 days total abx  ENDOCRINE  Lab 10/14/11 0749 10/14/11 0353 10/14/11 0019 10/13/11 2006 10/13/11 1639  GLUCAP 87 95 76 76 85   A:  Hypoglycemia.  Hyperglycemia.  DM. P:   CBG/SSI  NEUROLOGIC  A:  Acute encephalopathy, multifactorial. P:   Propofol gtt for goal RASS 0 to -1 Daily WUA Chair position If not improved, ct head  BEST PRACTICE / DISPOSITION Level of Care:  ICU Primary Service:  PCCM Consultants:  Renal, cards Code Status:  Full Diet:  TF 7/30 DVT Px:  Not indicated (on Heparin gtt) GI Px:  Protonix Skin Integrity:  Intact  Social / Family:  Update daughter Quincy Simmonds (OB/GYN MD in [place]), reached and updated 7/31  The patient is critically ill with multiple organ systems failure and requires high complexity decision making for assessment and support, frequent evaluation and titration of therapies, application of advanced monitoring technologies and extensive  interpretation of multiple databases. Critical Care Time devoted to patient care services described in this note is 35 minutes.  Nelda Bucks., M.D. Pulmonary and Critical Care Medicine [facility] Pager: [phone]  10/14/2011, 9:16 AM

## 2011-10-14 NOTE — Progress Notes (Signed)
Hypoglycemic Event  CBG: 61  Treatment: D50 IV 25 mL  Symptoms: None  Follow-up CBG: Time:1705 CBG Result:127  Possible Reasons for Event: Inadequate meal intake  Comments/MD notified:will cont to monitor    Alexandra [name]  Remember to initiate Hypoglycemia Order Set & complete

## 2011-10-14 NOTE — Procedures (Signed)
Extubation Procedure Note  Patient Details:   Name: Alexandra [name] DOB: 1945-07-02 MRN: [id number]   Airway Documentation:     Evaluation  O2 sats: stable throughout Complications: No apparent complications Patient did tolerate procedure well. Bilateral Breath Sounds: Clear;Diminished Suctioning: Airway Yes pt able to vocalize.   Pt extubated at this time to 3L [place] per MD order and tolerated well. Pt able to breathe around deflated cuff and has strong adequate cough. No complications noted. No stridor noted. VS stable. RT will continue to monitor.    Loyal Jacobson [facility] 10/14/2011, 10:50 AM

## 2011-10-15 ENCOUNTER — Inpatient Hospital Stay (HOSPITAL_COMMUNITY): Payer: Medicare HMO

## 2011-10-15 LAB — CBC WITH DIFFERENTIAL/PLATELET
Basophils Absolute: 0 K/uL (ref 0.0–0.1)
Basophils Relative: 0 % (ref 0–1)
Eosinophils Absolute: 0.2 K/uL (ref 0.0–0.7)
Eosinophils Relative: 2 % (ref 0–5)
HCT: 25.3 % — ABNORMAL LOW (ref 36.0–46.0)
Hemoglobin: 8 g/dL — ABNORMAL LOW (ref 12.0–15.0)
Lymphocytes Relative: 19 % (ref 12–46)
Lymphs Abs: 1.9 K/uL (ref 0.7–4.0)
MCH: 23.6 pg — ABNORMAL LOW (ref 26.0–34.0)
MCHC: 31.6 g/dL (ref 30.0–36.0)
MCV: 74.6 fL — ABNORMAL LOW (ref 78.0–100.0)
Monocytes Absolute: 1.5 K/uL — ABNORMAL HIGH (ref 0.1–1.0)
Monocytes Relative: 15 % — ABNORMAL HIGH (ref 3–12)
Neutro Abs: 6.3 K/uL (ref 1.7–7.7)
Neutrophils Relative %: 64 % (ref 43–77)
Platelets: 167 K/uL (ref 150–400)
RBC: 3.39 MIL/uL — ABNORMAL LOW (ref 3.87–5.11)
RDW: 22.7 % — ABNORMAL HIGH (ref 11.5–15.5)
WBC: 9.9 K/uL (ref 4.0–10.5)

## 2011-10-15 LAB — BASIC METABOLIC PANEL
BUN: 29 mg/dL — ABNORMAL HIGH (ref 6–23)
Chloride: 97 mEq/L (ref 96–112)
Glucose, Bld: 73 mg/dL (ref 70–99)
Potassium: 3.6 mEq/L (ref 3.5–5.1)

## 2011-10-15 LAB — GLUCOSE, CAPILLARY
Glucose-Capillary: 116 mg/dL — ABNORMAL HIGH (ref 70–99)
Glucose-Capillary: 51 mg/dL — ABNORMAL LOW (ref 70–99)
Glucose-Capillary: 75 mg/dL (ref 70–99)

## 2011-10-15 LAB — ALBUMIN: Albumin: 3.3 g/dL — ABNORMAL LOW (ref 3.5–5.2)

## 2011-10-15 LAB — MAGNESIUM: Magnesium: 2.3 mg/dL (ref 1.5–2.5)

## 2011-10-15 LAB — PRO B NATRIURETIC PEPTIDE: Pro B Natriuretic peptide (BNP): 31669 pg/mL — ABNORMAL HIGH (ref 0–125)

## 2011-10-15 MED ORDER — REPAGLINIDE 0.5 MG PO TABS
0.5000 mg | ORAL_TABLET | Freq: Three times a day (TID) | ORAL | Status: DC
  Administered 2011-10-15 – 2011-10-21 (×16): 0.5 mg via ORAL
  Filled 2011-10-15 (×20): qty 1

## 2011-10-15 MED ORDER — WHITE PETROLATUM GEL
Status: AC
  Administered 2011-10-15: 15:00:00
  Filled 2011-10-15: qty 5

## 2011-10-15 MED ORDER — BISACODYL 5 MG PO TBEC
10.0000 mg | DELAYED_RELEASE_TABLET | Freq: Once | ORAL | Status: AC
  Administered 2011-10-15: 10 mg via ORAL
  Filled 2011-10-15: qty 2

## 2011-10-15 MED ORDER — PARICALCITOL 5 MCG/ML IV SOLN
INTRAVENOUS | Status: AC
  Administered 2011-10-15: 1 ug via INTRAVENOUS
  Filled 2011-10-15: qty 1

## 2011-10-15 MED ORDER — DEXTROSE 50 % IV SOLN
25.0000 mL | Freq: Once | INTRAVENOUS | Status: AC | PRN
  Administered 2011-10-15: 25 mL via INTRAVENOUS

## 2011-10-15 MED ORDER — CHLORHEXIDINE GLUCONATE 0.12 % MT SOLN
OROMUCOSAL | Status: AC
  Filled 2011-10-15: qty 15

## 2011-10-15 MED ORDER — DARBEPOETIN ALFA-POLYSORBATE 100 MCG/0.5ML IJ SOLN
INTRAMUSCULAR | Status: AC
  Filled 2011-10-15: qty 0.5

## 2011-10-15 NOTE — Progress Notes (Signed)
Subjective:  Patient successfully extubated yesterday doing well. Denies any chest pain or shortness of breath. Denies any history of exertional chest pain in the past. Clinically improved after hemodialysis  Objective:  Vital Signs in the last 24 hours: Temp:  [97.8 F (36.6 C)-98.8 F (37.1 C)] 97.8 F (36.6 C) (08/02 0809) Pulse Rate:  [76-103] 91  (08/02 1105) Resp:  [12-34] 21  (08/02 1105) BP: (108-224)/(33-176) 149/46 mmHg (08/02 1105) SpO2:  [93 %-100 %] 100 % (08/02 0800) Weight:  [53.1 kg (117 lb 1 oz)-55.8 kg (123 lb 0.3 oz)] 53.1 kg (117 lb 1 oz) (08/02 1115)  Intake/Output from previous day: 08/01 0701 - 08/02 0700 In: 315 [I.V.:205; NG/GT:60; IV Piggyback:50] Out: -  Intake/Output from this shift: Total I/O In: 20 [I.V.:20] Out: 2006 [Other:2006]  Physical Exam: Neck: no adenopathy, no carotid bruit, no JVD and supple, symmetrical, trachea midline Lungs: Decreased breath sound at bases with occasional rales and rhonchi Heart: regular rate and rhythm, S1, S2 normal and Soft systolic murmur and S3 gallop noted Abdomen: soft, non-tender; bowel sounds normal; no masses,  no organomegaly Extremities: extremities normal, atraumatic, no cyanosis or edema  Lab Results:  [facility] 10/15/11 0707 10/14/11 0450  WBC 9.9 6.9  HGB 8.0* 8.8*  PLT 167 145*    Basename 10/15/11 0707 10/14/11 0700  NA 138 135  K 3.6 3.7  CL 97 93*  CO2 26 27  GLUCOSE 73 94  BUN 29* 12  CREATININE 5.33* 2.78*    Basename 10/14/11 0001 10/13/11 1213  TROPONINI 0.87* 0.87*   Hepatic Function Panel  Basename 10/15/11 0707 10/14/11 0700  PROT -- 7.4  ALBUMIN 3.3* --  AST -- 59*  ALT -- 39*  ALKPHOS -- 112  BILITOT -- 0.4  BILIDIR -- --  IBILI -- --    Basename 10/13/11 0310  CHOL 123   No results found for this basename: PROTIME in the last 72 hours  Imaging: Imaging results have been reviewed and Ct Head Wo Contrast  10/14/2011  *RADIOLOGY REPORT*  Clinical Data: Altered  mental status.  Rule out stroke  CT HEAD WITHOUT CONTRAST  Technique:  Contiguous axial images were obtained from the base of the skull through the vertex without contrast.  Comparison: None.  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 Chronic ischemia in the left frontal white matter is present.  No acute infarct.  Negative for hemorrhage or mass.  Image quality degraded by motion.  Multiple images were repeated due to motion.  IMPRESSION: Chronic left frontal infarct.  No acute abnormality.  Original Report Authenticated By: Camelia Phenes, M.D.   Dg Chest Port 1 View  10/15/2011  *RADIOLOGY REPORT*  Clinical Data: Extubated with renal insufficiency.  PORTABLE CHEST - 1 VIEW  Comparison: 1 day prior  Findings: Interval extubation.  Dialysis catheter unchanged, with tip at high right atrium.  A catheter projects over the upper chest and lower neck.  The nasogastric tube is no longer identified along the course of the esophagus.  Normal heart size.  Possible trace right pleural fluid. No pneumothorax.  Persistent mild pulmonary venous congestion and developing interstitial edema. No lobar consolidation.  IMPRESSION:  1.  Slight increase in the pulmonary venous congestion and developing interstitial edema. 2.  Extubation.  Presumed removal of nasogastric tube.  There is a catheter which projects over the upper chest and lower neck.  This is presumably external to the patient.  Original Report Authenticated By: Karn Cassis.  Reche Dixon, M.D.   Dg Chest Port 1 View  10/14/2011  *RADIOLOGY REPORT*  Clinical Data: Assess pulmonary edema.  Hemodialysis with pulmonary congestion  PORTABLE CHEST - 1 VIEW  Comparison: 10/13/2011  Findings: Endotracheal tube is 4.5 cm above the level of the carina, stable in position.  Hemodialysis catheter is in place via a right subclavian approach with the tip located overlying the distal superior vena cava at the level of the superior cavoatrial junction.  A nasogastric tube  is in place and the tip is below the level of the hemidiaphragms and not visualized on this exam.  Heart and mediastinal contours are stable.  There has been an interval increase in pulmonary vascular congestion, mild central peribronchial cuffing, fissural prominence and interstitial septal lines compatible with interval development of very mild interstitial edema.  Question small left pleural effusion. Scarring changes at the left lung base are seen with no overt congestive failure or focal infiltrates identified.  IMPRESSION: Interval development of mild interstitial edema pattern with new small left pleural effusion.  Original Report Authenticated By: Bertha Stakes, M.D.   Dg Chest Port 1 View  10/13/2011  *RADIOLOGY REPORT*  Clinical Data: Hemodialysis.  Pulmonary congestion.  PORTABLE CHEST - 1 VIEW  Comparison: 10/13/2011, 0708 hours.  Findings: Improved pulmonary vascular congestion and basilar edema. Cardiopericardial silhouette appears within normal limits. Endotracheal tube remains present with the tip about 5 cm from the carina.  Enteric tube and dialysis catheter unchanged.  IMPRESSION: Improved pulmonary aeration compatible with decreasing edema and vascular congestion after hemodialysis.  Stable support apparatus.  Original Report Authenticated By: Andreas Newport, M.D.    Cardiac Studies:  Assessment/Plan:  Probable very small non-Q-wave myocardial infarction secondary to demand ischemia rule out coronary insufficiency doubt significant MI in view of normal LV systolic function and wall motion  Status post hypertensive emergency  Resolving acute pulmonary edema secondary to volume overload/diastolic dysfunction  Probable aspiration pneumonia  Diabetes mellitus  End-stage renal disease on hemodialysis  Hypercholesteremia  History of gouty arthritis  Anemia of chronic disease Plan Continue present management Will discuss with patient regarding stress test early next week or as  outpatient if the patient discharged this weekend  LOS: 4 days    Alexandra [name] 10/15/2011, 11:55 AM

## 2011-10-15 NOTE — Progress Notes (Signed)
Hypoglycemic Event  CBG: 52  Treatment: D50 IV 25 mL  Symptoms: None  Follow-up CBG: Time:0500 CBG Result:116  Possible Reasons for Event: Unknown  Comments/MD notified:na    Burna Cash F  Remember to initiate Hypoglycemia Order Set & complete

## 2011-10-15 NOTE — Progress Notes (Signed)
Patient wants to take her own med, PRandin 59. Mg 3 times a day before meals but she does not have an order for this med. I spoke with Dr. Frederico Hamman about this med but she wants this to be address by MD in AM.   I explained to patient that it is against hospital policy for patient to take their own med.  Pt stated that knows that.

## 2011-10-15 NOTE — Progress Notes (Signed)
Patient refused Novolog insulin per sliding scale and prefer to take her own med.  Informed patient that we don't want her taking her own med for the simple fact that nurses also give her her med.  She brought her pill box with medicine on it.  I took it out of her purse and place it on her chart.  Her friend Rosetta came to see her and I encouraged her to take it home with her but she don't want to upset the patient.  I spoke with one of the pharmacy staff regarding the pill box and she recommend to send it down to the pharmacy and just count the meds on each box.   Patient is upset with me at this time.

## 2011-10-15 NOTE — Progress Notes (Signed)
Name: Alexandra [name] MRN: [id number] DOB: Aug 18, 1945    LOS: 4  Referring Provider:  [facility] Reason for Referral:  Acute respiratory failure  PULMONARY / CRITICAL CARE MEDICINE  Brief patient description:  66 yo ESRD on HD admitted on 7/29 with dyspnea and transferred to ICU due to acute respiratory distress requiring intubation.  In ED:  BiPAP started, given Vancomycin / Zosyn.  Emergent HD upon admission.  Troponin elevated.  Events Since Admission: 7/29  Admitted for SOB, BiPAP started, Vancomycin / Zosyn given, emergent HD 7/30  Transferred to 2300 with respiratory distress, hypertensive, intubated 7/30 repeat HD tolerated 7/31- weaned to some degree 8/1- extubated  Current Status: Eating, more awake, no distress  Vital Signs: Temp:  [97.8 F (36.6 C)-98.8 F (37.1 C)] 97.8 F (36.6 C) (08/02 0809) Pulse Rate:  [76-103] 86  (08/02 0900) Resp:  [12-34] 19  (08/02 0900) BP: (127-224)/(40-176) 129/113 mmHg (08/02 0900) SpO2:  [93 %-100 %] 100 % (08/02 0700) Weight:  [55 kg (121 lb 4.1 oz)-55.8 kg (123 lb 0.3 oz)] 55 kg (121 lb 4.1 oz) (08/02 0865)  Physical Examination: General: awake, talkative Neuro:  follows commads, noncompliant HEENT:  PERRL 2 mm Neck:  JVD Cardiovascular:  RRR, distant no m Lungs:  cta anterior Abdomen:  Soft, nontender, nondistended, bowel sounds present Musculoskeletal:  Moves all extremities Skin:  Intact  Active Problems:  Hemodialysis patient  Hypertensive CKD, ESRD on dialysis  ESRD on hemodialysis  Diabetes mellitus with end stage renal disease  Acute respiratory failure  Leukocytosis  Troponin level elevated  ASSESSMENT AND PLAN  PULMONARY  Lab 10/12/11 0450  PHART 7.342*  PCO2ART 65.8*  PO2ART 171.0*  HCO3 35.7*  O2SAT 99.0    Ventilator Settings:    CXR:  8/2- improved edema ETT:  7/30 >>>8/1  A:  Acute respiratory failure. Possible HCAP.  Possible aspiration.  Possible acute pulmonary edema secondary to  congestive heart failure, hypertensive emergency, fluid overload.  P:   Lower O2 to goal [place] noncompliant with O2 pcxr improved For repeat HD today  CARDIOVASCULAR  Lab 10/15/11 0707 10/14/11 0001 10/13/11 1213 10/13/11 0305 10/12/11 1805 10/12/11 1115 10/11/11 0845  TROPONINI -- 0.87* 0.87* 0.90* 0.68* 1.27* --  LATICACIDVEN -- -- -- -- -- -- 1.9  PROBNP 31669.0* -- -- -- -- -- --   ECG:  7/29 >>> NSr, nonspecific ST-T changes Echo 7/31- 55%, no wall moition Lines:  R tunneled HD cath ??? >>>  A: Suspected acute congestive heart failure.  Rising troponin, demand ischemia vs NSTEMI.  Hypertensive emergency P:  Asa, beta blocker Afterload reduction Statin Cards plans stress test in future  RENAL  Lab 10/15/11 0707 10/14/11 0700 10/13/11 0310 10/12/11 1115 10/11/11 2141 10/11/11 0903 10/11/11 0845 10/08/11 2221  NA 138 135 139 140 -- 136 -- --  K 3.6 3.7 -- -- -- -- -- --  CL 97 93* 98 96 -- 102 -- --  CO2 26 27 32 34* -- -- 25 --  BUN 29* 12 11 33* -- 61* -- --  CREATININE 5.33* 2.78* 2.56* 5.17* 3.48* -- -- --  CALCIUM 9.0 9.6 9.4 9.1 -- -- 9.8 --  MG -- -- -- -- -- -- -- --  PHOS -- -- -- 3.8 -- -- -- 3.4   Intake/Output      08/01 0701 - 08/02 0700 08/02 0701 - 08/03 0700   I.V. (mL/kg) 205 (3.7)    NG/GT 60    IV Piggyback 50  Total Intake(mL/kg) 315 (5.7)    Other     Total Output     Net +315          Foley:  NA  A:  ESRD on HD.  History of medical noncompliance. P:   HD per Renal now ongoing  GASTROINTESTINAL  Lab 10/14/11 0700 10/13/11 0310 10/12/11 1115 10/08/11 2221  AST 59* 39* -- --  ALT 39* 32 -- --  ALKPHOS 112 98 -- --  BILITOT 0.4 0.5 -- --  PROT 7.4 6.8 -- --  ALBUMIN 3.7 3.6 3.1* 3.3*   A:  Malnourished, constipated P:   Diet start ppi dulc oral colace  HEMATOLOGIC  Lab 10/15/11 0707 10/14/11 0450 10/13/11 0310 10/12/11 1115 10/11/11 2141  HGB 8.0* 8.8* 7.8* 8.1* 8.3*  HCT 25.3* 29.0* 25.1* 25.6* 26.6*  PLT 167 145* 133*  172 170  INR -- -- -- -- --  APTT -- -- -- -- --   A:  Anemia.  No overt hemorrhage. hemoconetrating P:  Sub q hep (refusing) Cbc in am   INFECTIOUS  Lab 10/15/11 0707 10/14/11 0450 10/13/11 0310 10/12/11 1115 10/11/11 2141  WBC 9.9 6.9 7.6 10.6* 7.8  PROCALCITON -- -- -- 23.02 --   Cultures: 7/30  Respiratory >>>NF  Antibiotics: Vancomycin 7/29 >>>7/31 Zosyn 7/29 >>>8/1 Ceftriaxone 8/1>>>plan 8/4  A:  Suspected HCAP / aspiration pneumonia. P:   ceftriaxone to duration7 days total abx  ENDOCRINE  Lab 10/15/11 0804 10/15/11 0502 10/15/11 0434 10/15/11 0053 10/14/11 2009  GLUCAP 75 116* 51* 73 115*   A:  Hypoglycemia.  Hyperglycemia.  DM. P:   CBG/SSI Start diet  NEUROLOGIC Ct head 8/1 Chronic left frontal infarct. No acute abnormality-  A:  Acute encephalopathy, multifactorial. P:   CT neg She is noncompliant will counsel her PT  BEST PRACTICE / DISPOSITION Level of Care:  ICU to sdu Primary Service:  PCCM-to ttraid 8/3 Consultants:  Renal, cards Code Status:  Full Diet:  TF 7/30 DVT Px:  Sub q hep GI Px:  Protonix Skin Integrity:  Intact  Social / Family:  Update daughter Quincy Simmonds (OB/GYN MD in [place]), reached and updated 7/31   Nelda Bucks., M.D. Pulmonary and Critical Care Medicine [facility] Pager: [phone]  10/15/2011, 9:25 AM

## 2011-10-15 NOTE — Progress Notes (Signed)
Lab tech refusing to draw a.m. Labs.  Stating,  "I know her and they can get her labs when they do H.D.  I will continue to monitor.

## 2011-10-15 NOTE — Progress Notes (Signed)
Pt refusing to let tech stick her finger on her left hand to get cbg.  Will only agree to a finger stick on her restricted arm.  Nurse tech refused to stick any finger on restricted side.  Pt stuck her own finger on right side to get a cbg of 73.  I will continue to monitor.

## 2011-10-15 NOTE — Progress Notes (Signed)
PT Cancellation Note  Treatment cancelled today due to patient receiving procedure or test (HD).  Alexandra [name] 10/15/2011, 11:01 AM

## 2011-10-16 DIAGNOSIS — I10 Essential (primary) hypertension: Secondary | ICD-10-CM

## 2011-10-16 MED ORDER — LEVOFLOXACIN IN D5W 500 MG/100ML IV SOLN: 500.0000 mg | INTRAVENOUS | Status: DC

## 2011-10-16 MED ORDER — CLONIDINE HCL 0.2 MG/24HR TD PTWK
0.2000 mg | MEDICATED_PATCH | TRANSDERMAL | Status: DC
  Administered 2011-10-16: 0.2 mg via TRANSDERMAL
  Filled 2011-10-16: qty 1

## 2011-10-16 MED ORDER — LEVOFLOXACIN IN D5W 750 MG/150ML IV SOLN
750.0000 mg | Freq: Once | INTRAVENOUS | Status: DC
  Filled 2011-10-16: qty 150

## 2011-10-16 MED ORDER — IPRATROPIUM-ALBUTEROL 18-103 MCG/ACT IN AERO
6.0000 | INHALATION_SPRAY | Freq: Four times a day (QID) | RESPIRATORY_TRACT | Status: DC | PRN
  Filled 2011-10-16: qty 14.7

## 2011-10-16 MED ORDER — PANTOPRAZOLE SODIUM 40 MG PO PACK
40.0000 mg | PACK | Freq: Every day | ORAL | Status: DC
  Filled 2011-10-16: qty 20

## 2011-10-16 MED ORDER — ALLOPURINOL 100 MG PO TABS
100.0000 mg | ORAL_TABLET | Freq: Every day | ORAL | Status: DC
  Administered 2011-10-17 – 2011-10-21 (×4): 100 mg via ORAL
  Filled 2011-10-16 (×6): qty 1

## 2011-10-16 MED ORDER — HYDRALAZINE HCL 50 MG PO TABS
50.0000 mg | ORAL_TABLET | Freq: Three times a day (TID) | ORAL | Status: DC
  Administered 2011-10-16 – 2011-10-21 (×13): 50 mg via ORAL
  Filled 2011-10-16 (×17): qty 1

## 2011-10-16 MED ORDER — LEVOFLOXACIN 500 MG PO TABS
500.0000 mg | ORAL_TABLET | ORAL | Status: DC
  Administered 2011-10-18: 500 mg via ORAL
  Filled 2011-10-16: qty 1

## 2011-10-16 MED ORDER — INSULIN ASPART 100 UNIT/ML ~~LOC~~ SOLN
0.0000 [IU] | Freq: Three times a day (TID) | SUBCUTANEOUS | Status: DC
Start: 2011-10-16 — End: 2011-10-21

## 2011-10-16 MED ORDER — LEVOFLOXACIN 750 MG PO TABS
750.0000 mg | ORAL_TABLET | Freq: Once | ORAL | Status: AC
  Administered 2011-10-16: 750 mg via ORAL
  Filled 2011-10-16: qty 1

## 2011-10-16 NOTE — Progress Notes (Signed)
Subjective:  Patient denies any chest pain or shortness of breath. States had the nuclear stress test a few months ago at [facility] for evaluation for renal transplant which was normal. Patient denies any history of exertional chest pain Objective:  Vital Signs in the last 24 hours: Temp:  [97.8 F (36.6 C)-98.5 F (36.9 C)] 98.5 F (36.9 C) (08/03 1200) Pulse Rate:  [88-91] 91  (08/03 0346) Resp:  [17-26] 21  (08/03 0346) BP: (137-173)/(39-71) 162/39 mmHg (08/03 1200) SpO2:  [92 %-100 %] 98 % (08/03 1200) Weight:  [55 kg (121 lb 4.1 oz)] 55 kg (121 lb 4.1 oz) (08/03 0439)  Intake/Output from previous day: 08/02 0701 - 08/03 0700 In: 20 [I.V.:20] Out: 2006  Intake/Output from this shift: Total I/O In: 360 [P.O.:360] Out: 1 [Stool:1]  Physical Exam: Neck: no adenopathy, no carotid bruit, no JVD and supple, symmetrical, trachea midline Lungs: Decreased breath sound at bases with occasional rhonchi and rales air entry improved Heart: regular rate and rhythm, S1, S2 normal and Soft systolic murmur and S3 gallop noted Abdomen: soft, non-tender; bowel sounds normal; no masses,  no organomegaly Extremities: extremities normal, atraumatic, no cyanosis or edema  Lab Results:  [facility] 10/15/11 0707 10/14/11 0450  WBC 9.9 6.9  HGB 8.0* 8.8*  PLT 167 145*    Basename 10/15/11 0707 10/14/11 0700  NA 138 135  K 3.6 3.7  CL 97 93*  CO2 26 27  GLUCOSE 73 94  BUN 29* 12  CREATININE 5.33* 2.78*    Basename 10/14/11 0001  TROPONINI 0.87*   Hepatic Function Panel  Basename 10/15/11 0707 10/14/11 0700  PROT -- 7.4  ALBUMIN 3.3* --  AST -- 59*  ALT -- 39*  ALKPHOS -- 112  BILITOT -- 0.4  BILIDIR -- --  IBILI -- --   No results found for this basename: CHOL in the last 72 hours No results found for this basename: PROTIME in the last 72 hours  Imaging: Imaging results have been reviewed and Dg Chest Port 1 View  10/15/2011  *RADIOLOGY REPORT*  Clinical Data:  Extubated with renal insufficiency.  PORTABLE CHEST - 1 VIEW  Comparison: 1 day prior  Findings: Interval extubation.  Dialysis catheter unchanged, with tip at high right atrium.  A catheter projects over the upper chest and lower neck.  The nasogastric tube is no longer identified along the course of the esophagus.  Normal heart size.  Possible trace right pleural fluid. No pneumothorax.  Persistent mild pulmonary venous congestion and developing interstitial edema. No lobar consolidation.  IMPRESSION:  1.  Slight increase in the pulmonary venous congestion and developing interstitial edema. 2.  Extubation.  Presumed removal of nasogastric tube.  There is a catheter which projects over the upper chest and lower neck.  This is presumably external to the patient.  Original Report Authenticated By: Consuello Bossier, M.D.    Cardiac Studies:  Assessment/Plan:  Probable very small non-Q-wave myocardial infarction secondary to demand ischemia rule out coronary insufficiency doubt significant MI in view of normal LV systolic function and wall motion  Status post hypertensive emergency  Resolving acute pulmonary edema secondary to volume overload/diastolic dysfunction  Probable aspiration pneumonia  Diabetes mellitus  End-stage renal disease on hemodialysis  Hypercholesteremia  History of gouty arthritis  Anemia of chronic disease  Plan  Continue present management No further cardiac workup at this point. Will get the stress test report from [facility].  LOS: 5 days    [facility]  N 10/16/2011, 1:39 PM

## 2011-10-16 NOTE — Progress Notes (Signed)
Pt request Prandin during report. Off going nurse explained to pt about calling MD and response. Pt stated she understood.  1950: Pt request to have IV dressing changed. Started to change dressing pt states "I WANT MY PRANDIN NOW". Explained to pt conversation previously. Pt states "I do not care. You need to call Dr. Bascom Levels. I worked for him and with him and he will give me what I want". Question Pt about Physician. Pt states "Just call him and get my Prandin".  2015: Dr. Bascom Levels paged and returned call. Orders received for Prandin and for CBG and insulin to be change ACHS.  Will continue to monitor this pt.

## 2011-10-16 NOTE — Progress Notes (Signed)
PT Cancellation Note  Treatment cancelled today due to patient's refusal to participate x3 attempts.  Will return tomorrow to attempt PT evaluation.  Vena Austria 10/16/2011, 3:29 PM [phone]

## 2011-10-16 NOTE — Progress Notes (Signed)
ANTIBIOTIC CONSULT NOTE - INITIAL  Pharmacy Consult for levaquin Indication: pneumonia  Allergies  Allergen Reactions  . Codeine Other (See Comments)    Passes out  . Epinephrine Other (See Comments)    Tachycardia, diaphoresis, syncope  . Percocet (Oxycodone-Acetaminophen) Other (See Comments)    Passes  out    Patient Measurements: Height: 5' 8.11" (173 cm) Weight: 121 lb 4.1 oz (55 kg) IBW/kg (Calculated) : 64.15    Vital Signs: Temp: 98.4 F (36.9 C) (08/03 0439) Temp src: Oral (08/03 0439) BP: 153/43 mmHg (08/03 0346) Pulse Rate: 91  (08/03 0346) Intake/Output from previous day: 08/02 0701 - 08/03 0700 In: 20 [I.V.:20] Out: 2006  Intake/Output from this shift:    Labs:  Basename 10/15/11 0707 10/14/11 0700 10/14/11 0450  WBC 9.9 -- 6.9  HGB 8.0* -- 8.8*  PLT 167 -- 145*  LABCREA -- -- --  CREATININE 5.33* 2.78* --   Estimated Creatinine Clearance: 8.8 ml/min (by C-G formula based on Cr of 5.33).   Microbiology:  Medical History: Past Medical History  Diagnosis Date  . Chronic kidney disease     esrd  . Diabetes mellitus   . Hyperlipidemia   . Hypertension   . Renal disorder   . Dialysis care     tues, thurs, sat  . Gout due to renal impairment     Medications:  Scheduled:    . albuterol-ipratropium  6 puff Inhalation Q4H  . amLODipine  10 mg Oral Daily  . antiseptic oral rinse  15 mL Mouth Rinse QID  . aspirin  324 mg Oral Once  . atorvastatin  20 mg Per Tube q1800  . bisacodyl  10 mg Oral Once  . calcium acetate  667 mg Oral QID  . chlorhexidine  15 mL Mouth/Throat BID  . chlorhexidine      . cloNIDine  0.1 mg Transdermal Weekly  . darbepoetin      . docusate sodium  200 mg Oral QHS  . epoetin (EPOGEN/PROCRIT) injection  10,000 Units Intravenous Q M,W,F-HD  . heparin flush  20 Units Intracatheter Q12H  . heparin subcutaneous  5,000 Units Subcutaneous Q8H  . hydrALAZINE  25 mg Oral Q8H  . insulin aspart  0-15 Units Subcutaneous TID  AC & HS  . l-methylfolate-B6-B12  1 tablet Oral BID  . losartan  100 mg Oral QHS  . metoprolol succinate  100 mg Oral BID  . multivitamin  1 tablet Oral Daily  . pantoprazole (PROTONIX) IV  40 mg Intravenous QHS  . paricalcitol  1 mcg Intravenous Q M,W,F-HD  . repaglinide  0.5 mg Oral TID AC  . sodium chloride  10-40 mL Intracatheter Q12H  . sodium chloride  3 mL Intravenous Q12H  . white petrolatum      . DISCONTD: cefTRIAXone (ROCEPHIN)  IV  1 g Intravenous Q24H  . DISCONTD: insulin aspart  0-15 Units Subcutaneous Q4H   Assessment: 66 yo female with PNA and recent extubation. Patient was once vancomycin/zosyn then ceftriaxone now to change to levaquin. Patient noted with ESRD on HD  Zosyn 7/29>>8/1 Vanc 7/29>>7/31 Ceftriaxone : 8/1 > 8/2  7/30 resp- non path flora 7/31 blood x1- NGTD  Plan:  -Levaquin 750mg  IV x1 followed by 500mg  IV q48hr -Will follow progress  Harland German, Pharm D 10/16/2011 8:34 AM

## 2011-10-16 NOTE — Progress Notes (Signed)
2230: Pt refused IV from this nurse without local anesthesia. Reviewed cons without having IV. Pt continues to refuse.IV team called.  0220: IV team in room, pt continues to refused IV access without "local". IV team and this nurse explained cons of not having IV access. Pt responded, "That is very unlikely."  MD called and made aware of situation and pt responses. Ok not to have IV access during shift.

## 2011-10-16 NOTE — Progress Notes (Signed)
[facility] PROGRESS NOTE  Alexandra [name] YNW:[id number] DOB: 08/30/1945 DOA: 10/11/2011 PCP: [name], MD  Assessment/Plan: A: Acute respiratory failure. Possible HCAP. Possible aspiration. Possible acute pulmonary edema secondary to congestive heart failure, hypertensive emergency, fluid overload.  P:  Resolved with dialysis   A: Suspected acute congestive heart failure. Rising troponin due demand ischemia & Hypertensive emergency  P:  Asa, beta blocker  Echo reveals good EF but mild (gr 1) diastolic dysfunction  Increasing Clonidine patch to 0.2 today- pt states her BP is never controlled- hates taking more and more pills. Diastolic v low therefore will need to watch Pt states stress test at [facility] about 5 mo ago was negative.    A: ESRD on HD. History of medical noncompliance.  P:  HD per Renal now ongoing    A: Malnourished, constipated  P:  Tolerating diet  ppi  dulc oral  colace    A: Anemia. No overt hemorrhage. hemoconetrating  P:  Sub q hep (refusing)    A: Suspected HCAP / aspiration pneumonia.  Cultures:  7/30 Respiratory >>>NF  Antibiotics:  Vancomycin 7/29 >>>7/31  Zosyn 7/29 >>>8/1  Ceftriaxone 8/1>>>plan 8/4  P:  Refusing IV today- Have started PO levaquin-    A: Hypoglycemia. Hyperglycemia. DM.  P:  CBG/SSI  Start diet    A: Acute encephalopathy, multifactorial.  P:  CT neg  PT   Code Status: full code Disposition Plan: transfer out of SDU   Brief narrative: 66 yo ESRD on HD admitted on 7/29 with dyspnea and transferred to ICU due to acute respiratory distress in the middle of the night. In ED: BiPAP started, given Vancomycin / Zosyn. Emergent HD upon admission. Troponin elevated. Pt was eventually intubated. Extubated on 8/1- required a sitter.    Consultants:  Cardiology  Nephrology  Procedures: 7/30 Transferred to 2300 with respiratory distress, hypertensive, intubated 8/1-  extubated  HPI/Subjective: Alert and in good mood today. Coughing up white sputum. No chest pain. No problems with appetite.  IV is out and she does not want to be stuck for IV as she has poor access. C/o itching in her bottom- asked for hydrocortisone ointment- advised that it will eventually result in skin breakdown- agrees to A&D ointment. Mentions stress test at [facility] was negative 5 months ago.   Objective: Filed Vitals:   10/16/11 0800 10/16/11 0840 10/16/11 1200 10/16/11 1500  BP: 173/46  162/39   Pulse:      Temp: 98.4 F (36.9 C)  98.5 F (36.9 C)   TempSrc: Oral  Oral   Resp:      Height:    5\' 8"  (1.727 m)  Weight:    55.2 kg (121 lb 11.1 oz)  SpO2: 97% 98% 98%     Intake/Output Summary (Last 24 hours) at 10/16/11 1919 Last data filed at 10/16/11 1451  Gross per 24 hour  Intake    360 ml  Output      4 ml  Net    356 ml    Exam:   General:  No distress, AAO x 3  Cardiovascular: RRR, 2/6 murmur left upper sternal border  Respiratory: decreased breath sounds esp in right lung field.   Abdomen: soft, NT, ND, BS+  Ext:no c/c/e  Data Reviewed: Basic Metabolic Panel:  Lab 10/15/11 6578 10/14/11 0700 10/13/11 0310 10/12/11 1115 10/11/11 2141 10/11/11 0903 10/11/11 0845  NA 138 135 139 140 -- 136 --  K 3.6 3.7 4.6 3.8 -- 4.8 --  CL 97 93* 98 96 -- 102 --  CO2 26 27 32 34* -- -- 25  GLUCOSE 73 94 81 85 -- 197* --  BUN 29* 12 11 33* -- 61* --  CREATININE 5.33* 2.78* 2.56* 5.17* 3.48* -- --  CALCIUM 9.0 9.6 9.4 9.1 -- -- 9.8  MG 2.3 -- -- -- -- -- --  PHOS 5.2* -- -- 3.8 -- -- --   Liver Function Tests:  Lab 10/15/11 0707 10/14/11 0700 10/13/11 0310 10/12/11 1115  AST -- 59* 39* --  ALT -- 39* 32 --  ALKPHOS -- 112 98 --  BILITOT -- 0.4 0.5 --  PROT -- 7.4 6.8 --  ALBUMIN 3.3* 3.7 3.6 3.1*   No results found for this basename: LIPASE:5,AMYLASE:5 in the last 168 hours No results found for this basename: AMMONIA:5 in the last 168 hours CBC:  Lab  10/15/11 0707 10/14/11 0450 10/13/11 0310 10/12/11 1115 10/11/11 2141 10/11/11 0845  WBC 9.9 6.9 7.6 10.6* 7.8 --  NEUTROABS 6.3 3.4 3.6 -- 5.2 11.7*  HGB 8.0* 8.8* 7.8* 8.1* 8.3* --  HCT 25.3* 29.0* 25.1* 25.6* 26.6* --  MCV 74.6* 75.1* 75.4* 74.6* 74.5* --  PLT 167 145* 133* 172 170 --   Cardiac Enzymes:  Lab 10/14/11 0001 10/13/11 1213 10/13/11 0305 10/12/11 1805 10/12/11 1115  CKTOTAL 1026* 915* 306* 258* 280*  CKMB 6.0* 5.9* 4.3* 5.8* 7.2*  CKMBINDEX -- -- -- -- --  TROPONINI 0.87* 0.87* 0.90* 0.68* 1.27*   BNP (last 3 results)  Basename 10/15/11 0707  PROBNP 31669.0*   CBG:  Lab 10/16/11 1630 10/16/11 1234 10/15/11 2037 10/15/11 1155 10/15/11 0804  GLUCAP 111* 100* 137* 155* 75       Studies: Dg Chest 1 View  10/11/2011  *RADIOLOGY REPORT*  Clinical Data: Shortness of breath.  Status post dialysis.  CHEST - 1 VIEW  Comparison: Earlier today.  Findings: The pulmonary vasculature and interstitial markings are less prominent.  The cardiac silhouette remains borderline enlarged.  Stable right jugular double-lumen catheter and thoracic spine degenerative changes.  IMPRESSION: Improving changes of congestive heart failure.  Original Report Authenticated By: Darrol Angel, M.D.   Ct Head Wo Contrast  10/14/2011  *RADIOLOGY REPORT*  Clinical Data: Altered mental status.  Rule out stroke  CT HEAD WITHOUT CONTRAST  Technique:  Contiguous axial images were obtained from the base of the skull through the vertex without contrast.  Comparison: None.  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 Chronic ischemia in the left frontal white matter is present.  No acute infarct.  Negative for hemorrhage or mass.  Image quality degraded by motion.  Multiple images were repeated due to motion.  IMPRESSION: Chronic left frontal infarct.  No acute abnormality.  Original Report Authenticated By: Camelia Phenes, M.D.   Dg Chest Port 1 View  10/15/2011  *RADIOLOGY REPORT*  Clinical  Data: Extubated with renal insufficiency.  PORTABLE CHEST - 1 VIEW  Comparison: 1 day prior  Findings: Interval extubation.  Dialysis catheter unchanged, with tip at high right atrium.  A catheter projects over the upper chest and lower neck.  The nasogastric tube is no longer identified along the course of the esophagus.  Normal heart size.  Possible trace right pleural fluid. No pneumothorax.  Persistent mild pulmonary venous congestion and developing interstitial edema. No lobar consolidation.  IMPRESSION:  1.  Slight increase in the pulmonary venous congestion and developing interstitial edema. 2.  Extubation.  Presumed removal  of nasogastric tube.  There is a catheter which projects over the upper chest and lower neck.  This is presumably external to the patient.  Original Report Authenticated By: Consuello Bossier, M.D.      Scheduled Meds:reviewed ________________________________________________________________________  Time spent: 35 min    [facility]  [facility] Pager [phone] If 8PM-8AM, please contact night-coverage at [email or web address], password [facility] 10/16/2011, 7:19 PM  LOS: 5 days

## 2011-10-17 ENCOUNTER — Inpatient Hospital Stay (HOSPITAL_COMMUNITY): Payer: Medicare HMO

## 2011-10-17 ENCOUNTER — Encounter (HOSPITAL_COMMUNITY): Payer: Self-pay | Admitting: *Deleted

## 2011-10-17 DIAGNOSIS — E119 Type 2 diabetes mellitus without complications: Secondary | ICD-10-CM

## 2011-10-17 LAB — GLUCOSE, CAPILLARY
Glucose-Capillary: 131 mg/dL — ABNORMAL HIGH (ref 70–99)
Glucose-Capillary: 92 mg/dL (ref 70–99)
Glucose-Capillary: 132 mg/dL — ABNORMAL HIGH (ref 70–99)
Glucose-Capillary: 106 mg/dL — ABNORMAL HIGH (ref 70–99)

## 2011-10-17 MED ORDER — CLONIDINE HCL 0.1 MG/24HR TD PTWK: 0.1000 mg | MEDICATED_PATCH | TRANSDERMAL | Status: DC

## 2011-10-17 MED ORDER — VITAMINS A & D EX OINT
TOPICAL_OINTMENT | CUTANEOUS | Status: DC | PRN
  Filled 2011-10-17: qty 5

## 2011-10-17 MED ORDER — TRAMADOL HCL 50 MG PO TABS
50.0000 mg | ORAL_TABLET | Freq: Four times a day (QID) | ORAL | Status: DC | PRN
  Administered 2011-10-18: 50 mg via ORAL
  Filled 2011-10-17 (×2): qty 1

## 2011-10-17 MED ORDER — HEPARIN SODIUM (PORCINE) 1000 UNIT/ML DIALYSIS
20.0000 [IU]/kg | INTRAMUSCULAR | Status: DC | PRN
  Administered 2011-10-18: 1000 [IU] via INTRAVENOUS_CENTRAL
  Filled 2011-10-17: qty 2

## 2011-10-17 NOTE — Consult Note (Signed)
  Pt. Up in a wheel chair . She feels  Improved chest is much clearer. Weight is 56kg. Dialysis orders written for tomorrow.

## 2011-10-17 NOTE — Progress Notes (Signed)
Patient Alexandra [name], 66 year old [place] American female relies on the strength of her [employer] faith.  She struggles with multiple health issues, but notes that "as the body grows weaker, the Spirit is stronger."  Patient thanked Orthoptist for providing pastoral presence and conversation.  I will follow up Monday.

## 2011-10-17 NOTE — Progress Notes (Signed)
[facility] PROGRESS NOTE  Alexandra [name] XBJ:[id number] DOB: [birth year] DOA: 10/11/2011 PCP: [name], MD  Assessment/Plan: A: Acute respiratory failure. Most likely due to acute pulmonary edema secondary to congestive heart failure, hypertensive emergency, fluid overload. In a patient with end-stage renal disease P:  Resolved with dialysis   A: Suspected acute congestive heart failure. Rising troponin due demand ischemia & Hypertensive emergency . Patient had the non-Q-wave myocardial infarction also known as type II MI  P:  Asa, beta blocker  Echo reveals good EF but mild (gr 1) diastolic dysfunction  Increasing Clonidine patch to 0.2 today- pt states her BP is never controlled- hates taking more and more pills. Diastolic v low therefore will need to watch Pt states stress test at [facility] about 5 mo ago was negative.    A: ESRD on HD HD per Renal now ongoing   A: Anemia. No overt hemorrhage. hemoconetrating  P:  Sub q hep (refusing)    A: Suspected HCAP / aspiration pneumonia.  Cultures:  7/30 Respiratory >>>NF  Antibiotics:  Vancomycin 7/29 >>>7/31  Zosyn 7/29 >>>8/1  Ceftriaxone 8/1>>>plan 8/3 PO levaquin- started on August 3   A: Hypoglycemia. Hyperglycemia. DM.  P: Resume Prandin   A: Acute encephalopathy, multifactorial.  P:  CT neg  PT Getting psychiatric evaluation  Code Status: full code Disposition Plan: Home Family communication-[place]calfe,Dr. Joni Reining Daughter [phone]    Brief narrative: 66 yo ESRD on HD admitted on 7/29 with dyspnea and transferred to ICU due to acute respiratory distress in the middle of the night. In ED: BiPAP started, given Vancomycin / Zosyn. Emergent HD upon admission. Troponin elevated. Pt was eventually intubated. Extubated on 8/1  Consultants:  Cardiology - Harwani  Nephrology: Bascom Levels   HPI/Subjective: Includes spirits, denies chest pain denies dyspnea  Objective: Filed  Vitals:   10/16/11 1500 10/16/11 2131 10/17/11 0601 10/17/11 1400  BP:  158/56 169/68 123/63  Pulse:   78 74  Temp:  98.4 F (36.9 C) 98.8 F (37.1 C) 98.7 F (37.1 C)  TempSrc:  Oral Oral Oral  Resp:  18 18 18   Height: 5\' 8"  (1.727 m)     Weight: 55.2 kg (121 lb 11.1 oz)  56 kg (123 lb 7.3 oz)   SpO2:  96% 94% 100%    Intake/Output Summary (Last 24 hours) at 10/17/11 1502 Last data filed at 10/17/11 1300  Gross per 24 hour  Intake   1480 ml  Output      0 ml  Net   1480 ml    Exam:   General:  No distress, AAO x 3  Cardiovascular: RRR, 2/6 murmur left upper sternal border  Respiratory: decreased breath sounds esp in right lung field.   Abdomen: soft, NT, ND, BS+  Ext:no c/c/e  Data Reviewed: Basic Metabolic Panel:  Lab 10/15/11 4401 10/14/11 0700 10/13/11 0310 10/12/11 1115 10/11/11 2141 10/11/11 0903 10/11/11 0845  NA 138 135 139 140 -- 136 --  K 3.6 3.7 4.6 3.8 -- 4.8 --  CL 97 93* 98 96 -- 102 --  CO2 26 27 32 34* -- -- 25  GLUCOSE 73 94 81 85 -- 197* --  BUN 29* 12 11 33* -- 61* --  CREATININE 5.33* 2.78* 2.56* 5.17* 3.48* -- --  CALCIUM 9.0 9.6 9.4 9.1 -- -- 9.8  MG 2.3 -- -- -- -- -- --  PHOS 5.2* -- -- 3.8 -- -- --   Liver Function Tests:  Lab 10/15/11  1610 10/14/11 0700 10/13/11 0310 10/12/11 1115  AST -- 59* 39* --  ALT -- 39* 32 --  ALKPHOS -- 112 98 --  BILITOT -- 0.4 0.5 --  PROT -- 7.4 6.8 --  ALBUMIN 3.3* 3.7 3.6 3.1*   No results found for this basename: LIPASE:5,AMYLASE:5 in the last 168 hours No results found for this basename: AMMONIA:5 in the last 168 hours CBC:  Lab 10/15/11 0707 10/14/11 0450 10/13/11 0310 10/12/11 1115 10/11/11 2141 10/11/11 0845  WBC 9.9 6.9 7.6 10.6* 7.8 --  NEUTROABS 6.3 3.4 3.6 -- 5.2 11.7*  HGB 8.0* 8.8* 7.8* 8.1* 8.3* --  HCT 25.3* 29.0* 25.1* 25.6* 26.6* --  MCV 74.6* 75.1* 75.4* 74.6* 74.5* --  PLT 167 145* 133* 172 170 --   Cardiac Enzymes:  Lab 10/14/11 0001 10/13/11 1213 10/13/11 0305  10/12/11 1805 10/12/11 1115  CKTOTAL 1026* 915* 306* 258* 280*  CKMB 6.0* 5.9* 4.3* 5.8* 7.2*  CKMBINDEX -- -- -- -- --  TROPONINI 0.87* 0.87* 0.90* 0.68* 1.27*   BNP (last 3 results)  Basename 10/15/11 0707  PROBNP 31669.0*   CBG:  Lab 10/17/11 1202 10/17/11 0740 10/16/11 2134 10/16/11 1630 10/16/11 1234  GLUCAP 132* 92 111* 111* 100*       Studies: Dg Chest 1 View  10/11/2011  *RADIOLOGY REPORT*  Clinical Data: Shortness of breath.  Status post dialysis.  CHEST - 1 VIEW  Comparison: Earlier today.  Findings: The pulmonary vasculature and interstitial markings are less prominent.  The cardiac silhouette remains borderline enlarged.  Stable right jugular double-lumen catheter and thoracic spine degenerative changes.  IMPRESSION: Improving changes of congestive heart failure.  Original Report Authenticated By: Darrol Angel, M.D.   Ct Head Wo Contrast  10/14/2011  *RADIOLOGY REPORT*  Clinical Data: Altered mental status.  Rule out stroke  CT HEAD WITHOUT CONTRAST  Technique:  Contiguous axial images were obtained from the base of the skull through the vertex without contrast.  Comparison: None.  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 Chronic ischemia in the left frontal white matter is present.  No acute infarct.  Negative for hemorrhage or mass.  Image quality degraded by motion.  Multiple images were repeated due to motion.  IMPRESSION: Chronic left frontal infarct.  No acute abnormality.  Original Report Authenticated By: Camelia Phenes, M.D.   Dg Chest Port 1 View  10/15/2011  *RADIOLOGY REPORT*  Clinical Data: Extubated with renal insufficiency.  PORTABLE CHEST - 1 VIEW  Comparison: 1 day prior  Findings: Interval extubation.  Dialysis catheter unchanged, with tip at high right atrium.  A catheter projects over the upper chest and lower neck.  The nasogastric tube is no longer identified along the course of the esophagus.  Normal heart size.  Possible trace right  pleural fluid. No pneumothorax.  Persistent mild pulmonary venous congestion and developing interstitial edema. No lobar consolidation.  IMPRESSION:  1.  Slight increase in the pulmonary venous congestion and developing interstitial edema. 2.  Extubation.  Presumed removal of nasogastric tube.  There is a catheter which projects over the upper chest and lower neck.  This is presumably external to the patient.  Original Report Authenticated By: Consuello Bossier, M.D.         . allopurinol  100 mg Oral Daily  . amLODipine  10 mg Oral Daily  . antiseptic oral rinse  15 mL Mouth Rinse QID  . aspirin  324 mg Oral Once  . atorvastatin  20 mg Per Tube q1800  . calcium acetate  667 mg Oral QID  . chlorhexidine  15 mL Mouth/Throat BID  . cloNIDine  0.1 mg Transdermal Weekly  . docusate sodium  200 mg Oral QHS  . epoetin (EPOGEN/PROCRIT) injection  10,000 Units Intravenous Q M,W,F-HD  . heparin flush  20 Units Intracatheter Q12H  . heparin subcutaneous  5,000 Units Subcutaneous Q8H  . hydrALAZINE  50 mg Oral Q8H  . insulin aspart  0-15 Units Subcutaneous TID AC & HS  . l-methylfolate-B6-B12  1 tablet Oral BID  . levofloxacin  500 mg Oral Q48H  . losartan  100 mg Oral QHS  . metoprolol succinate  100 mg Oral BID  . multivitamin  1 tablet Oral Daily  . paricalcitol  1 mcg Intravenous Q M,W,F-HD  . repaglinide  0.5 mg Oral TID AC  . DISCONTD: cloNIDine  0.2 mg Transdermal Weekly    ________________________________________________________________________  Time spent: 35 min    Cosmo Tetreault  [facility] Pager [phone] If 8PM-8AM, please contact night-coverage at [email or web address], password [facility] 10/17/2011, 3:02 PM  LOS: 6 days

## 2011-10-17 NOTE — Progress Notes (Signed)
Physical Therapy Evaluation Patient Details Name: Roselynn Whitacre MRN: [id number] DOB: 1945-05-24 Today's Date: 10/17/2011 Time: 4098-1191 PT Time Calculation (min): 37 min  PT Assessment / Plan / Recommendation Clinical Impression  66 yo female admitted with hypertensive crisis, MI, pulm edema, resp failure presents to PT with decr activity tolerance, generalized weakness, suboptimal posture; Will benefit from acute PT to address defecits, maximize independence and safety with mobility to enable safe dc home; Pt responded quite well to suggested posture therex like chest opening stretching and scapualr retraction    PT Assessment  Patient needs continued PT services    Follow Up Recommendations  Home health PT    Barriers to Discharge Decreased caregiver support Pt must be modified independent to dc home    Equipment Recommendations  Rolling walker with 5" wheels;3 in 1 bedside comode    Recommendations for Other Services OT consult   Frequency Min 3X/week    Precautions / Restrictions Precautions Precautions: Fall Restrictions Weight Bearing Restrictions: No   Pertinent Vitals/Pain R shoulder sore; pt did not rate pain; just back from shoulder x-ray      Mobility  Bed Mobility Bed Mobility: Supine to Sit;Sitting - Scoot to Edge of Bed Supine to Sit: 4: Min guard;With rails Sitting - Scoot to Edge of Bed: 4: Min guard;With rail Details for Bed Mobility Assistance: cues for technique; pt is very independent and doesn't want to be helped Transfers Transfers: Sit to Stand;Stand to Sit Sit to Stand: 4: Min assist;With upper extremity assist;From bed Stand to Sit: 4: Min assist;To chair/3-in-1;With upper extremity assist;With armrests Details for Transfer Assistance: Pulled up on RW despite safety cues to push up from bed; Min assist to steady rW as pt pushed up on it Ambulation/Gait Ambulation/Gait Assistance: 4: Min guard (with and without physical contact) Ambulation  Distance (Feet): 50 Feet Assistive device: Rolling walker Ambulation/Gait Assistance Details: Cues for RW proximity and posture Gait Pattern: Decreased stride length    Exercises     PT Diagnosis: Difficulty walking (suboptimal posture)  PT Problem List: Decreased strength;Decreased activity tolerance;Decreased balance;Decreased mobility;Decreased knowledge of use of DME PT Treatment Interventions: DME instruction;Gait training;Stair training;Functional mobility training;Therapeutic activities;Therapeutic exercise;Balance training;Patient/family education   PT Goals Acute Rehab PT Goals PT Goal Formulation: With patient Time For Goal Achievement: 10/31/11 Potential to Achieve Goals: Good Pt will go Supine/Side to Sit: with modified independence PT Goal: Supine/Side to Sit - Progress: Goal set today Pt will go Sit to Supine/Side: with modified independence PT Goal: Sit to Supine/Side - Progress: Goal set today Pt will go Sit to Stand: with modified independence PT Goal: Sit to Stand - Progress: Goal set today Pt will go Stand to Sit: with modified independence PT Goal: Stand to Sit - Progress: Goal set today Pt will Ambulate: >150 feet;with modified independence;with rolling walker;with least restrictive assistive device PT Goal: Ambulate - Progress: Goal set today Pt will Go Up / Down Stairs: 1-2 stairs;with modified independence;with rolling walker;with least restrictive assistive device PT Goal: Up/Down Stairs - Progress: Goal set today Pt will Perform Home Exercise Program: Independently (therex aimed at posture) PT Goal: Perform Home Exercise Program - Progress: Goal set today  Visit Information  Last PT Received On: 10/17/11 Assistance Needed: +1    Subjective Data  Subjective: Is very concerned about her posture Patient Stated Goal: better posture   Prior Functioning  Home Living Lives With: Alone Available Help at Discharge: Personal care attendant (a few days per  week) Type of  Home: House Home Access: Stairs to enter Secretary/administrator of Steps: 1 Entrance Stairs-Rails: None Home Layout: One level Bathroom Shower/Tub: Teacher, adult education: Grab bars in shower;Grab bars around toilet Prior Function Level of Independence: Needs assistance Needs Assistance: Bathing Driving: No Comments: 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; also studied Engineer, structural: No difficulties Dominant Hand: Right    Cognition  Overall Cognitive Status: Appears within functional limits for tasks assessed/performed Arousal/Alertness: Awake/alert Orientation Level: Appears intact for tasks assessed Behavior During Session: [facility] for tasks performed    Extremity/Trunk Assessment Right Upper Extremity Assessment RUE ROM/Strength/Tone: Within functional levels Left Upper Extremity Assessment LUE ROM/Strength/Tone: Deficits LUE ROM/Strength/Tone Deficits: left shoulder soreness per pt subjective report; Just got back from x-ray Right Lower Extremity Assessment RLE ROM/Strength/Tone: Deficits RLE ROM/Strength/Tone Deficits: Generalized weakness, requiring heavy dependence on UEs pushing up on RW; and 3 attempts befrore successful standing Left Lower Extremity Assessment LLE ROM/Strength/Tone: Deficits LLE ROM/Strength/Tone Deficits: Generalized weakness, requiring heavy dependence on UEs pushing up on RW Trunk Assessment Trunk Assessment: Kyphotic;Other exceptions Trunk Exceptions: head and shoulders forward posture   Balance    End of Session PT - End of Session Equipment Utilized During Treatment: Gait belt Activity Tolerance: Patient tolerated treatment well Patient left: in chair;with call bell/phone within reach (2 blankets out of warmer around pt shoulders and legs) Nurse Communication: Mobility status  GP     Olen Pel Lind, [place] [phone]  10/17/2011, 3:13 PM

## 2011-10-17 NOTE — Progress Notes (Signed)
Subjective: Patient denies any chest pain or shortness of breath up in bed states feels better  Objective:  Vital Signs in the last 24 hours: Temp:  [98.4 F (36.9 C)-98.8 F (37.1 C)] 98.8 F (37.1 C) (08/04 0601) Pulse Rate:  [78] 78  (08/04 0601) Resp:  [18] 18  (08/04 0601) BP: (158-169)/(39-68) 169/68 mmHg (08/04 0601) SpO2:  [94 %-98 %] 94 % (08/04 0601) Weight:  [55.2 kg (121 lb 11.1 oz)-56 kg (123 lb 7.3 oz)] 56 kg (123 lb 7.3 oz) (08/04 0601)  Intake/Output from previous day: 08/03 0701 - 08/04 0700 In: 1000 [P.O.:1000] Out: 4 [Urine:1; Stool:3] Intake/Output from this shift: Total I/O In: 480 [P.O.:480] Out: -   Physical Exam: Neck: no adenopathy, no carotid bruit, no JVD and supple, symmetrical, trachea midline Lungs: Decreased breasts are at bases with occasional rhonchi Heart: regular rate and rhythm, S1, S2 normal and Soft systolic murmur noted Abdomen: soft, non-tender; bowel sounds normal; no masses,  no organomegaly Extremities: extremities normal, atraumatic, no cyanosis or edema  Lab Results:  [facility] 10/15/11 0707  WBC 9.9  HGB 8.0*  PLT 167    Basename 10/15/11 0707  NA 138  K 3.6  CL 97  CO2 26  GLUCOSE 73  BUN 29*  CREATININE 5.33*   No results found for this basename: TROPONINI:2,CK,MB:2 in the last 72 hours Hepatic Function Panel  Basename 10/15/11 0707  PROT --  ALBUMIN 3.3*  AST --  ALT --  ALKPHOS --  BILITOT --  BILIDIR --  IBILI --   No results found for this basename: CHOL in the last 72 hours No results found for this basename: PROTIME in the last 72 hours  Imaging: Imaging results have been reviewed and Dg Shoulder Right  10/17/2011  *RADIOLOGY REPORT*  Clinical Data: Right shoulder pain.  RIGHT SHOULDER - 2+ VIEW  Comparison: Portable chest 10/15/2011  Findings: Right shoulder is located.  Degenerative changes are present in the [facility] joint.  Minimal calcifications are likely associated with a right subclavian artery.  A  right IJ dialysis catheter is stable.  The right hemithorax is otherwise clear. Previously seen pulmonary vascular congestion is improved.  IMPRESSION:  1.  No acute abnormality. 2.  Stable degenerative changes of the right AC joint. 3.  Improved pulmonary vascular congestion. 4.  Right subclavian artery calcifications.  Original Report Authenticated By: Jamesetta Orleans. MATTERN, M.D.    Cardiac Studies:  Assessment/Plan:  Probable very small non-Q-wave myocardial infarction secondary to demand ischemia rule out coronary insufficiency doubt significant MI in view of normal LV systolic function and wall motion  Status post hypertensive emergency  Resolving acute pulmonary edema secondary to volume overload/diastolic dysfunction  Probable aspiration pneumonia  Diabetes mellitus  End-stage renal disease on hemodialysis  Hypercholesteremia  History of gouty arthritis  Anemia of chronic disease  Plan  Continue present management I will sign off please call if needed  LOS: 6 days    Tamon Parkerson N 10/17/2011, 10:32 AM

## 2011-10-18 ENCOUNTER — Inpatient Hospital Stay (HOSPITAL_COMMUNITY): Payer: Medicare HMO

## 2011-10-18 LAB — CBC
HCT: 25.5 % — ABNORMAL LOW (ref 36.0–46.0)
MCH: 23.6 pg — ABNORMAL LOW (ref 26.0–34.0)
MCV: 73.3 fL — ABNORMAL LOW (ref 78.0–100.0)
RDW: 22.8 % — ABNORMAL HIGH (ref 11.5–15.5)
WBC: 7.1 10*3/uL (ref 4.0–10.5)

## 2011-10-18 LAB — RENAL FUNCTION PANEL
Albumin: 3.1 g/dL — ABNORMAL LOW (ref 3.5–5.2)
BUN: 68 mg/dL — ABNORMAL HIGH (ref 6–23)
Calcium: 9.9 mg/dL (ref 8.4–10.5)
Chloride: 92 mEq/L — ABNORMAL LOW (ref 96–112)
Creatinine, Ser: 9.41 mg/dL — ABNORMAL HIGH (ref 0.50–1.10)

## 2011-10-18 LAB — GLUCOSE, CAPILLARY: Glucose-Capillary: 104 mg/dL — ABNORMAL HIGH (ref 70–99)

## 2011-10-18 MED ORDER — PARICALCITOL 5 MCG/ML IV SOLN
INTRAVENOUS | Status: AC
  Administered 2011-10-18: 1 ug via INTRAVENOUS
  Filled 2011-10-18: qty 1

## 2011-10-18 MED ORDER — DICLOFENAC SODIUM 1 % TD GEL
Freq: Four times a day (QID) | TRANSDERMAL | Status: DC
  Administered 2011-10-18 – 2011-10-21 (×10): via TOPICAL
  Filled 2011-10-18: qty 100

## 2011-10-18 NOTE — Progress Notes (Signed)
[facility] PROGRESS NOTE  Alexandra [name] JYN:[id number] DOB: 10-03-1945 DOA: 10/11/2011 PCP: [name], MD  Assessment/Plan: A: Acute respiratory failure. Most likely due to acute pulmonary edema secondary to congestive heart failure, hypertensive emergency, fluid overload. In a patient with end-stage renal disease P:  Resolved with dialysis   A: Suspected acute congestive heart failure. Rising troponin due demand ischemia & Hypertensive emergency . Patient had the non-Q-wave myocardial infarction also known as type II MI  P:  Asa, beta blocker  Echo reveals good EF but mild (gr 1) diastolic dysfunction  Increasing Clonidine patch to 0.2 today- pt states her BP is never controlled- hates taking more and more pills. Diastolic v low therefore will need to watch Pt states stress test at [facility] about 5 mo ago was negative.    A: ESRD on HD HD per Renal now ongoing   A: Anemia. No overt hemorrhage. hemoconetrated P:  Sub q hep (refusing)    A: Suspected HCAP / aspiration pneumonia. - very mild  Cultures:  7/30 Respiratory >>>NF  Antibiotics:  Vancomycin 7/29 >>>7/31  Zosyn 7/29 >>>8/1  Ceftriaxone 8/1>>>plan 8/3 PO levaquin- started on August 3   A: Hypoglycemia. Hyperglycemia. DM.  P: Resume Prandin   A: Acute encephalopathy, multifactorial.  P:  CT neg  PT Getting psychiatric evaluation for capacity - suspect she will pass it easily   Code Status: full code Disposition Plan: Home Family communication-[place],Dr. Joni Reining Daughter [phone]    Brief narrative: 66 yo ESRD on HD admitted on 7/29 with dyspnea and transferred to ICU due to acute respiratory distress in the middle of the night. In ED: BiPAP started, given Vancomycin / Zosyn. Emergent HD upon admission. Troponin elevated. Pt was eventually intubated. Extubated on 8/1  Consultants:  Cardiology - Harwani  Nephrology: Bascom Levels   HPI/Subjective: No new  complaints  Objective: Filed Vitals:   10/17/11 1833 10/17/11 2134 10/18/11 0509 10/18/11 0624  BP: 135/98 122/45 129/50   Pulse: 84 69 69   Temp: 97.6 F (36.4 C) 97.8 F (36.6 C) 98 F (36.7 C)   TempSrc: Oral Oral Oral   Resp: 18 18 18    Height: 5\' 5"  (1.651 m)     Weight: 97.1 kg (214 lb 1.1 oz)   61 kg (134 lb 7.7 oz)  SpO2: 99% 99% 99%     Intake/Output Summary (Last 24 hours) at 10/18/11 0757 Last data filed at 10/18/11 0500  Gross per 24 hour  Intake   1080 ml  Output      0 ml  Net   1080 ml    Exam:   Alert and oriented x3  Chest clear to auscultation bilaterally  Heart regular rate and rhythm  Data Reviewed: Basic Metabolic Panel:  Lab 10/15/11 0102 10/14/11 0700 10/13/11 0310 10/12/11 1115 10/11/11 2141 10/11/11 0903 10/11/11 0845  NA 138 135 139 140 -- 136 --  K 3.6 3.7 4.6 3.8 -- 4.8 --  CL 97 93* 98 96 -- 102 --  CO2 26 27 32 34* -- -- 25  GLUCOSE 73 94 81 85 -- 197* --  BUN 29* 12 11 33* -- 61* --  CREATININE 5.33* 2.78* 2.56* 5.17* 3.48* -- --  CALCIUM 9.0 9.6 9.4 9.1 -- -- 9.8  MG 2.3 -- -- -- -- -- --  PHOS 5.2* -- -- 3.8 -- -- --   Liver Function Tests:  Lab 10/15/11 0707 10/14/11 0700 10/13/11 0310 10/12/11 1115  AST -- 59* 39* --  ALT -- 39* 32 --  ALKPHOS -- 112 98 --  BILITOT -- 0.4 0.5 --  PROT -- 7.4 6.8 --  ALBUMIN 3.3* 3.7 3.6 3.1*   No results found for this basename: LIPASE:5,AMYLASE:5 in the last 168 hours No results found for this basename: AMMONIA:5 in the last 168 hours CBC:  Lab 10/15/11 0707 10/14/11 0450 10/13/11 0310 10/12/11 1115 10/11/11 2141 10/11/11 0845  WBC 9.9 6.9 7.6 10.6* 7.8 --  NEUTROABS 6.3 3.4 3.6 -- 5.2 11.7*  HGB 8.0* 8.8* 7.8* 8.1* 8.3* --  HCT 25.3* 29.0* 25.1* 25.6* 26.6* --  MCV 74.6* 75.1* 75.4* 74.6* 74.5* --  PLT 167 145* 133* 172 170 --   Cardiac Enzymes:  Lab 10/14/11 0001 10/13/11 1213 10/13/11 0305 10/12/11 1805 10/12/11 1115  CKTOTAL 1026* 915* 306* 258* 280*  CKMB 6.0* 5.9*  4.3* 5.8* 7.2*  CKMBINDEX -- -- -- -- --  TROPONINI 0.87* 0.87* 0.90* 0.68* 1.27*   BNP (last 3 results)  Basename 10/15/11 0707  PROBNP 31669.0*   CBG:  Lab 10/18/11 0110 10/17/11 2128 10/17/11 1710 10/17/11 1202 10/17/11 0740  GLUCAP 108* 145* 106* 132* 92       Studies: Dg Chest 1 View  10/11/2011  *RADIOLOGY REPORT*  Clinical Data: Shortness of breath.  Status post dialysis.  CHEST - 1 VIEW  Comparison: Earlier today.  Findings: The pulmonary vasculature and interstitial markings are less prominent.  The cardiac silhouette remains borderline enlarged.  Stable right jugular double-lumen catheter and thoracic spine degenerative changes.  IMPRESSION: Improving changes of congestive heart failure.  Original Report Authenticated By: Darrol Angel, M.D.   Ct Head Wo Contrast  10/14/2011  *RADIOLOGY REPORT*  Clinical Data: Altered mental status.  Rule out stroke  CT HEAD WITHOUT CONTRAST  Technique:  Contiguous axial images were obtained from the base of the skull through the vertex without contrast.  Comparison: None.  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 Chronic ischemia in the left frontal white matter is present.  No acute infarct.  Negative for hemorrhage or mass.  Image quality degraded by motion.  Multiple images were repeated due to motion.  IMPRESSION: Chronic left frontal infarct.  No acute abnormality.  Original Report Authenticated By: Camelia Phenes, M.D.   Dg Chest Port 1 View  10/15/2011  *RADIOLOGY REPORT*  Clinical Data: Extubated with renal insufficiency.  PORTABLE CHEST - 1 VIEW  Comparison: 1 day prior  Findings: Interval extubation.  Dialysis catheter unchanged, with tip at high right atrium.  A catheter projects over the upper chest and lower neck.  The nasogastric tube is no longer identified along the course of the esophagus.  Normal heart size.  Possible trace right pleural fluid. No pneumothorax.  Persistent mild pulmonary venous congestion and  developing interstitial edema. No lobar consolidation.  IMPRESSION:  1.  Slight increase in the pulmonary venous congestion and developing interstitial edema. 2.  Extubation.  Presumed removal of nasogastric tube.  There is a catheter which projects over the upper chest and lower neck.  This is presumably external to the patient.  Original Report Authenticated By: Consuello Bossier, M.D.         . allopurinol  100 mg Oral Daily  . amLODipine  10 mg Oral Daily  . antiseptic oral rinse  15 mL Mouth Rinse QID  . aspirin  324 mg Oral Once  . atorvastatin  20 mg Per Tube q1800  . calcium acetate  667 mg Oral  QID  . chlorhexidine  15 mL Mouth/Throat BID  . cloNIDine  0.1 mg Transdermal Weekly  . docusate sodium  200 mg Oral QHS  . epoetin (EPOGEN/PROCRIT) injection  10,000 Units Intravenous Q M,W,F-HD  . heparin flush  20 Units Intracatheter Q12H  . heparin subcutaneous  5,000 Units Subcutaneous Q8H  . hydrALAZINE  50 mg Oral Q8H  . insulin aspart  0-15 Units Subcutaneous TID AC & HS  . l-methylfolate-B6-B12  1 tablet Oral BID  . levofloxacin  500 mg Oral Q48H  . losartan  100 mg Oral QHS  . metoprolol succinate  100 mg Oral BID  . multivitamin  1 tablet Oral Daily  . paricalcitol  1 mcg Intravenous Q M,W,F-HD  . repaglinide  0.5 mg Oral TID AC  . DISCONTD: cloNIDine  0.2 mg Transdermal Weekly    ________________________________________________________________________    [facility] Pager [phone] If 7PM-7AM, please contact night-coverage at [email or web address], password [facility] 10/18/2011, 7:57 AM  LOS: 7 days

## 2011-10-18 NOTE — Consult Note (Signed)
Clinical Social Work Progress Note PSYCHIATRY SERVICE LINE 10/18/2011  Patient:  Alexandra [name]  Account:  [id number]  Admit Date:  10/11/2011  Clinical Social Worker:  Ashley Jacobs, LCSW  Date/Time:  10/18/2011 02:56 PM  Review of Patient  Overall Medical Condition:   Patient has just returned from dialysis and taken some pain medication in which she reports she is tired and loopie, but agreeable to complete assessment.  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  Patient reports she lost her son over a year ago, but is dealing correctly with the loss and is also compliant with her medical needs.    She reports she had an incident with [facility] in which she has been discharged, but still is under the care of Dr. Bascom Levels.  Dr. Ferol Luz has been updated regarding the consult for capacity. Patient was very blunt and direct, but appropriate/alert and oriented x4 regarding her care. She is able to have linear thought, slow to speak per her report of medications, but aware of why she is in the hospital and alert to time, place, and self.    She has a daughter whom is her support, currently still works and providers for herself.   Participation Level:  Active  Participation Quality  Appropriate   Other Participation Quality:   Patient able to provide remote and recent history of events,  as to why she is in the hospital.   Affect  Blunt   Cognitive  Appropriate  Alert  Oriented   Reaction to Medications/Concerns:   Patient had to take some pain medications because her arm hurt and she reports she is a feeling loopie.   Modes of Intervention  Orientation/Capacity  Exploration   Summary of Progress/Plan at Discharge   Patient plans to dc home under Dr. Wyline Copas care for her dialysis. She reports once she has regained strength she will go back to work and see her patients.    She lives alone at home, but does for herself per her report.  No evidence of SI, HI or psychosis.  Patient very well educated and direct when completing assessment.  Appears to have capacity after this assessment.  Will follow up with other recommendations per Dr. Ferol Luz.    Ashley Jacobs, MSW LCSW [phone]

## 2011-10-18 NOTE — Consult Note (Signed)
Received consult and attempted to meet with patient to complete assessment. Discussed case with MD attending: Dr. Lavera Guise regarding need and patient's history.  Patient currently in dialysis. Should return around lunch, thus will attempt to see patient early this afternoon with follow up.  Will also discuss case with psych MD regarding needs.  Will follow up.  Ashley Jacobs, MSW LCSW [phone]

## 2011-10-18 NOTE — Progress Notes (Signed)
OT Cancellation Note  Treatment cancelled today due to patient receiving procedure or test--psych social worker walked into the room not long after I started with patient and the patient politely asked if she could talk with her first. Will re-attempt eval 10/19/2011.  Evette Georges [phone] 10/18/2011, 4:11 PM

## 2011-10-18 NOTE — Consult Note (Signed)
  Pt. Is on dialysis.no problems today. Weight  Today is 55.8kg bp is 110/70  On no bp meds.

## 2011-10-19 DIAGNOSIS — D638 Anemia in other chronic diseases classified elsewhere: Secondary | ICD-10-CM

## 2011-10-19 DIAGNOSIS — F4320 Adjustment disorder, unspecified: Secondary | ICD-10-CM

## 2011-10-19 LAB — GLUCOSE, CAPILLARY
Glucose-Capillary: 85 mg/dL (ref 70–99)
Glucose-Capillary: 150 mg/dL — ABNORMAL HIGH (ref 70–99)

## 2011-10-19 LAB — CULTURE, BLOOD (SINGLE): Culture: NO GROWTH

## 2011-10-19 MED ORDER — GUAIFENESIN-DM 100-10 MG/5ML PO SYRP
5.0000 mL | ORAL_SOLUTION | ORAL | Status: DC | PRN
  Administered 2011-10-19 (×2): 5 mL via ORAL
  Filled 2011-10-19 (×4): qty 5

## 2011-10-19 MED ORDER — HEPARIN SODIUM (PORCINE) 1000 UNIT/ML DIALYSIS
20.0000 [IU]/kg | INTRAMUSCULAR | Status: DC | PRN
  Filled 2011-10-19: qty 2

## 2011-10-19 NOTE — Evaluation (Signed)
Occupational Therapy Evaluation Patient Details Name: Alexandra [name] MRN: [id number] DOB: August 15, 1945 Today's Date: 10/19/2011 Time:  -     OT Assessment / Plan / Recommendation Clinical Impression  Pt is a 66 yo female who presents with ARF. Skilled OT indicated to maximize independence with BADLs to mod I/supervision level in prep for safe d/c home.    OT Assessment  Patient needs continued OT Services    Follow Up Recommendations  Home health OT    Barriers to Discharge Decreased caregiver support    Equipment Recommendations  None recommended by OT    Recommendations for Other Services    Frequency  Min 2X/week    Precautions / Restrictions Precautions Precautions: Fall   Pertinent Vitals/Pain     ADL  Grooming: Performed;Min guard;Wash/dry hands Where Assessed - Grooming: Supported standing Upper Body Bathing: Simulated;Set up Where Assessed - Upper Body Bathing: Unsupported sitting Lower Body Bathing: Simulated;Minimal assistance Where Assessed - Lower Body Bathing: Supported sit to stand Upper Body Dressing: Simulated;Set up Where Assessed - Upper Body Dressing: Unsupported sitting Lower Body Dressing: Simulated;Minimal assistance Where Assessed - Lower Body Dressing: Supported sit to stand Toilet Transfer: Performed;Minimal assistance Toilet Transfer Method: Sit to Barista: Regular height toilet Toileting - Clothing Manipulation and Hygiene: Performed;Min guard Where Assessed - Engineer, mining and Hygiene: Sit to stand from 3-in-1 or toilet Equipment Used: Rolling walker ADL Comments: Pt refused to allow therapist to put 3:1 over top of toilet. Pt very pleasant, cooperative throughout.    OT Diagnosis: Generalized weakness  OT Problem List: Decreased strength;Decreased activity tolerance;Decreased knowledge of use of DME or AE OT Treatment Interventions: Self-care/ADL training;Therapeutic activities;DME and/or AE  instruction;Patient/family education   OT Goals Acute Rehab OT Goals OT Goal Formulation: With patient Time For Goal Achievement: 11/02/11 Potential to Achieve Goals: Good ADL Goals Pt Will Perform Grooming: with modified independence;Standing at sink ADL Goal: Grooming - Progress: Goal set today Pt Will Perform Lower Body Bathing: with modified independence;Sit to stand from chair;Sit to stand from bed ADL Goal: Lower Body Bathing - Progress: Goal set today Pt Will Perform Lower Body Dressing: with modified independence;Sit to stand from bed;Sit to stand from chair ADL Goal: Lower Body Dressing - Progress: Goal set today Pt Will Transfer to Toilet: with modified independence;Ambulation;Comfort height toilet;Regular height toilet;Grab bars ADL Goal: Toilet Transfer - Progress: Goal set today Pt Will Perform Toileting - Clothing Manipulation: with modified independence;Standing ADL Goal: Toileting - Clothing Manipulation - Progress: Goal set today Pt Will Perform Toileting - Hygiene: with modified independence;Sit to stand from 3-in-1/toilet ADL Goal: Toileting - Hygiene - Progress: Goal set today Pt Will Perform Tub/Shower Transfer: with supervision;Ambulation;Shower transfer;Grab bars ADL Goal: Web designer - Progress: Goal set today  Visit Information  Last OT Received On: 10/19/11 Assistance Needed: +1    Subjective Data  Subjective: 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   Prior Functioning  Vision/Perception  Home Living Lives With: Alone Available Help at Discharge: Personal care attendant;Other (Comment) (once every other week to clean) Type of Home: House Home Access: Stairs to enter [employer] of Steps: 1 Entrance Stairs-Rails: None Home Layout: One level Bathroom Shower/Tub: Tub/shower unit;Walk-in shower Bathroom Toilet: Standard Home Adaptive Equipment: Shower chair without back;Grab bars around  toilet;Grab bars in shower Prior Function Able to Take Stairs?: Yes Driving: Yes Vocation: Part time employment Communication Communication: No difficulties Dominant Hand: Right  Cognition  Overall Cognitive Status: Appears within functional limits for tasks assessed/performed Arousal/Alertness: Awake/alert Orientation Level: Appears intact for tasks assessed Behavior During Session: [facility] for tasks performed    Extremity/Trunk Assessment Right Upper Extremity Assessment RUE ROM/Strength/Tone: Deficits RUE ROM/Strength/Tone Deficits: 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 RUE Coordination: Deficits RUE Coordination Deficits: 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 Left Upper Extremity Assessment LUE ROM/Strength/Tone: WFL for tasks assessed   Mobility Transfers Sit to Stand: 4: Min guard;4: Min assist;From toilet;From chair/3-in-1;With upper extremity assist;With armrests Stand to Sit: 4: Min guard;With upper extremity assist;To chair/3-in-1;With armrests;To toilet Details for Transfer Assistance: Min VCs for posture   Exercise    Balance    End of Session OT - End of Session Activity Tolerance: Patient tolerated treatment well Patient left: in chair;with call bell/phone within reach  GO     Jeriel Vivanco A OTR/L [phone] 10/19/2011, 4:04 PM

## 2011-10-19 NOTE — Consult Note (Signed)
  Chart was reviewed. Pt is ambulating 200 ft at contact guard assistance already. PT is recommending home health, and I would agree.  I will defer a formal consult.   Ivory Broad, MD

## 2011-10-19 NOTE — Progress Notes (Signed)
Patient Alexandra [name], 66 year old [place] American female resident of [place] is in good spirits as faces various health challenges.  Patient expressed appreciation for Chaplain's provision of pastoral prayer, presence, and conversation.  I will follow up as needed.

## 2011-10-19 NOTE — Progress Notes (Signed)
Nutrition Follow-up  Intervention:  None  Assessment:   Pt tolerating diet well, eating 100% meals. Follows a renal diet very well at home. No questions or need for diet education noted at this time.   Diet Order:  Renal 60/70 2-2-2 1200 Supplements: none  Meds: Scheduled Meds:   . allopurinol  100 mg Oral Daily  . amLODipine  10 mg Oral Daily  . antiseptic oral rinse  15 mL Mouth Rinse QID  . aspirin  324 mg Oral Once  . atorvastatin  20 mg Per Tube q1800  . calcium acetate  667 mg Oral QID  . chlorhexidine  15 mL Mouth/Throat BID  . cloNIDine  0.1 mg Transdermal Weekly  . diclofenac sodium   Topical QID  . docusate sodium  200 mg Oral QHS  . epoetin (EPOGEN/PROCRIT) injection  10,000 Units Intravenous Q M,W,F-HD  . heparin flush  20 Units Intracatheter Q12H  . heparin subcutaneous  5,000 Units Subcutaneous Q8H  . hydrALAZINE  50 mg Oral Q8H  . insulin aspart  0-15 Units Subcutaneous TID AC & HS  . l-methylfolate-B6-B12  1 tablet Oral BID  . levofloxacin  500 mg Oral Q48H  . losartan  100 mg Oral QHS  . metoprolol succinate  100 mg Oral BID  . multivitamin  1 tablet Oral Daily  . paricalcitol  1 mcg Intravenous Q M,W,F-HD  . repaglinide  0.5 mg Oral TID AC   Continuous Infusions:  PRN Meds:.acetaminophen, albuterol-ipratropium, heparin flush, heparin, hydrALAZINE, traMADol, vitamin A & D, DISCONTD: heparin, DISCONTD: heparin  Labs:  CMP     Component Value Date/Time   NA 132* 10/18/2011 0957   K 3.6 10/18/2011 0957   CL 92* 10/18/2011 0957   CO2 25 10/18/2011 0957   GLUCOSE 170* 10/18/2011 0957   BUN 68* 10/18/2011 0957   CREATININE 9.41* 10/18/2011 0957   CALCIUM 9.9 10/18/2011 0957   CALCIUM 7.2* 08/22/2009 0440   PROT 7.4 10/14/2011 0700   ALBUMIN 3.1* 10/18/2011 0957   AST 59* 10/14/2011 0700   ALT 39* 10/14/2011 0700   ALKPHOS 112 10/14/2011 0700   BILITOT 0.4 10/14/2011 0700   GFRNONAA 4* 10/18/2011 0957   GFRAA 4* 10/18/2011 0957     Intake/Output Summary (Last 24 hours) at  10/19/11 1213 Last data filed at 10/19/11 0846  Gross per 24 hour  Intake    800 ml  Output    300 ml  Net    500 ml    Weight Status:  122 lbs, stable   Re-estimated needs:  1680-1960 kcal, 70-85 gm protein   Nutrition Dx:  Inadequate oral intake, now resolved   Goal:  Pt to meet >/=90% estimated nutrition needs, met   Monitor:  Labs, PO intake, weight    Clarene Duke RD, LDN Pager [phone] After Hours pager [phone]

## 2011-10-19 NOTE — Consult Note (Signed)
This consultation note is a combination of 10/18/11 and 10/19/11 secondary to On Call duties Pt was seen Monday 10/18/11 in the afternoon and at the time she is eating lunch 10/19/11.   Dr. Tobi Bastos is  [facility], alert and engages in conversation with spontaneous logical thought and speech.  Patient Identification:  Alexandra [name] Date of Evaluation:  10/19/2011 Reason for Consult: Evaluate Capacity  Referring Provider: Dr. Lavera Guise  History of Present Illness:Hx provided by Dr. Lavera Guise.  Pt required outpatient hemodialysis.  She has had a conflict with staff at the clinic.  Pt referred to being 'kicked out'.  Dr. was told that she may not return until she has a mental status evaluation. 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.  She has therapeutic experience in a variety of geographical locations and levels of training.  She has expectations according to her level of training to for staff personnel to conduct themselves in a professional manner.  She does not hesitate to discuss standards of care and/or staff -patient communication.  Her verbal expectations apparently caused perhaps conflict with certain staff members at the Hemodialysis Clinic.    Past Psychiatric History: Pt has RF and requires hemodialysis.  This causes adjustments and acceptance of a change in life style and her routine as a therapist.   Past Medical History:     Past Medical History  Diagnosis Date  . Chronic kidney disease     esrd  . Diabetes mellitus   . Hyperlipidemia   . Hypertension   . Renal disorder   . Dialysis care     tues, thurs, sat  . Gout due to renal impairment        Past Surgical History  Procedure Date  . Carotid endarterectomy 2002    left  . Btl   . Rectal fissurectomy   . Av fistula placement 10/30/2010    right brachiocephalic   . Fistulogram      Allergies:  Allergies  Allergen Reactions  . Codeine Other (See Comments)    Passes out  . Epinephrine Other (See Comments)    Tachycardia, diaphoresis, syncope  . Percocet (Oxycodone-Acetaminophen) Other (See Comments)    Passes  out    Current Medications:  Prior to Admission medications   Medication Sig Start Date End Date Taking? Authorizing Provider  allopurinol (ZYLOPRIM) 100 MG tablet Take 100 mg by mouth daily.     Yes Historical Provider, MD  amLODipine (NORVASC) 5 MG tablet Take 10 mg by mouth daily. 07/13/11  Yes [name], MD  calcium acetate (PHOSLO) 667 MG capsule Take 667 mg by mouth 4 (four) times daily.    Yes Historical Provider, MD  cloNIDine (CATAPRES - DOSED IN MG/24 HR) 0.1 mg/24hr patch Place 1 patch onto the skin once a week. Changes on friday   Yes Historical Provider, MD  Coenzyme Q10 150 MG CAPS Take 300 mg by mouth daily. Hold while in hospital   Yes Historical Provider, MD  docusate sodium (COLACE) 100 MG capsule Take 200 mg by mouth at bedtime.     Yes Historical Provider, MD  hydrALAZINE (APRESOLINE) 25 MG tablet Take 50 mg by mouth 3 (three) times daily.  07/18/11  Yes [name], MD  l-methylfolate-B6-B12 (METANX) 3-35-2 MG TABS Take 1 tablet by mouth 2 (two) times daily.    Yes Historical Provider,  MD  losartan (COZAAR) 50 MG tablet Take 100 mg by mouth at bedtime.    Yes Historical Provider, MD  metoprolol succinate (TOPROL-XL) 50 MG 24 hr tablet Take 100 mg by mouth 2 (two) times daily. Takes after dialysis (T, Th, Sat)   Yes Historical Provider, MD  multivitamin (RENA-VIT) TABS tablet Take 1 tablet by mouth daily.     Yes Historical Provider, MD  repaglinide (PRANDIN) 0.5 MG tablet Take 0.5 mg by mouth 3 (three) times daily before meals.    Yes Historical Provider, MD    Social History:    reports that she has quit smoking. Her smoking use included Cigarettes. She has a 50 pack-year smoking history. She has never used smokeless tobacco. She  reports that she does not drink alcohol or use illicit drugs.   Family History:    Family History  Problem Relation Age of Onset  . COPD Mother   . Kidney disease Mother   . Heart disease Father   . 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  AXIS IV economic problems, occupational problems, other psychosocial or environmental problems, problems related to social environment, problems with access to health care services and problems with primary support group  AXIS V 51-60 moderate symptoms   Assessment/Plan:  Discussed with Dr. Lavera Guise, Psych CSW  Pt is seen twice.  Pt is calm, She has spontaneous Speech that is logical and organized.  She has good eye contact.   Her volume drops because she has recently been extubated and she says she has a sore throat.  She describes her ancestry as [place] and a tribe in [place].  She has family members who are psychologist/ therapists  and her daughter is a Development worker, community.  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  She lives alone and takes care of her own home.   She has good reality testing.  She knows she made statements that challenged the clinics' practice[s] and revealed her expectations of highest professional conduct.  She denies any history of alcohol or drug use. She has no history of suicidal  thoughts or attempt, homicidal thoughts; no psychosis and no bizarre behavior or thought when engaging patient in conversation.  She enjoys talking about her travels and original historical art that she has enjoyed viewing.  She knows she needs hemodialysis and acknowledges that staff may not share the same expectations.  She says there are certain clinics she prefers to avoid because she treats patients in those areas.  She is eager to be discharged and to return to seeing her patients.  RECOMMENDATION:  1. This patient has capacity to manage her medical regimen.  She is very specific about arranging her medications: tallest bottles in back, shortest in front; pills in caddy for each day.  She is organized to keep appts.  2.  She has a level of training that may have influenced to expect all services to have optimal training which may cause certain aides and others to feel defensive.  It is helpful to take this in context of her effort to be excellent in her field and hold others to similar expectations. 3.  There is likely effort on her part to impart information gained from her consulting experiences that may sound critical where her intent was to be helpful and informative.  4.  Suggest allow patient to return to outpatient hemodialysis clinic.  5.  MD signs off unless further evaluation is requested.  Kazuko Clemence J. Ferol Luz, MD Psychiatrist  10/19/2011 6:33 PM

## 2011-10-19 NOTE — Progress Notes (Signed)
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 Codeine Other (See Comments)    Passes out  . Epinephrine Other (See Comments)    Tachycardia, diaphoresis, syncope  . Percocet (Oxycodone-Acetaminophen) Other (See Comments)    Passes  out    Patient Measurements: Height: 5\' 5"  (165.1 cm) Weight: 122 lb 12.7 oz (55.7 kg) IBW/kg (Calculated) : 57   Vital Signs: BP 163/55  Pulse 73  Temp 98.1 F (36.7 C) (Oral)  Resp 17  Ht 5\' 5"  (1.651 m)  Wt 122 lb 12.7 oz (55.7 kg)  BMI 20.43 kg/m2  SpO2 97%  LMP 09/27/2011  Intake/Output from previous day: 08/05 0701 - 08/06 0700 In: 920 [P.O.:920] Out: 300   Labs:  [facility] 10/18/11 0957  WBC 7.1  HGB 8.2*  PLT 221  LABCREA --  CREATININE 9.41*   Pt ESRD on HD MWF Cultures Negative or Non-pathogenic  Anti-infectives Anti-infectives     Start     Dose/Rate Route Frequency Ordered Stop   10/18/11 1000   levofloxacin (LEVAQUIN) IVPB 500 mg  Status:  Discontinued        500 mg 100 mL/hr over 60 Minutes Intravenous Every 48 hours 10/16/11 0837 10/16/11 0904   10/18/11 1000   levofloxacin (LEVAQUIN) tablet 500 mg        500 mg Oral Every 48 hours 10/16/11 0858     10/16/11 0930   levofloxacin (LEVAQUIN) tablet 750 mg        750 mg Oral  Once 10/16/11 0858 10/16/11 1310   10/16/11 0845   levofloxacin (LEVAQUIN) IVPB 750 mg  Status:  Discontinued        750 mg 100 mL/hr over 90 Minutes Intravenous  Once 10/16/11 0837 10/16/11 0904   10/14/11 1100   cefTRIAXone (ROCEPHIN) 1 g in dextrose 5 % 50 mL IVPB  Status:  Discontinued        1 g 100 mL/hr over 30 Minutes Intravenous Every 24 hours 10/14/11  0929 10/16/11 0816   10/13/11 1700   vancomycin (VANCOCIN) 750 mg in sodium chloride 0.9 % 150 mL IVPB  Status:  Discontinued        750 mg 150 mL/hr over 60 Minutes Intravenous Every M-W-F (Hemodialysis) 10/13/11 1413 10/14/11 0927   10/12/11 1500   vancomycin (VANCOCIN) IVPB 1000 mg/200 mL premix        1,000 mg 200 mL/hr over 60 Minutes Intravenous  Once 10/12/11 1352 10/12/11 1601   10/12/11 1400   piperacillin-tazobactam (ZOSYN) IVPB 2.25 g  Status:  Discontinued        2.25 g 100 mL/hr over 30 Minutes Intravenous 3 times per day 10/12/11 0944 10/14/11 0930   10/12/11 0600   piperacillin-tazobactam (ZOSYN) IVPB 3.375 g  Status:  Discontinued        3.375 g 100 mL/hr over 30 Minutes Intravenous 3 times per day 10/12/11 0502 10/12/11 0944   10/11/11 1030   vancomycin (VANCOCIN) IVPB 1000 mg/200 mL premix        1,000 mg 200 mL/hr over 60 Minutes Intravenous  Once 10/11/11 1028 10/11/11 1617   10/11/11 1030   piperacillin-tazobactam (ZOSYN) IVPB 2.25 g  Status:  Discontinued  2.25 g 100 mL/hr over 30 Minutes Intravenous  Once 10/11/11 1028 10/11/11 1838   10/11/11 1000   moxifloxacin (AVELOX) IVPB 400 mg  Status:  Discontinued        400 mg 250 mL/hr over 60 Minutes Intravenous  Once 10/11/11 0946 10/11/11 1026          Assessment:  66 yo female with dialysis dependent ESRD with PNA and recent extubation. Patient previously on vancomycin/zosyn, and ceftriaxone.  Day # 9 of antibiotic therapy, Day # 4 Levaquin.  Afebrile, last WBC 7.1.  All cultures negative or NON-pathogenic.  Plan:   No change in Levaquin doses needed.  MD to determine length of antibiotic therapy.  Consider discontinuing antibiotics.  Clio Gerhart, Elisha Headland, Pharm.D.  10/19/2011 12:53 PM

## 2011-10-19 NOTE — Progress Notes (Signed)
[facility] PROGRESS NOTE  Shaunee Mulkern Offutt ZOX:[id number] DOB: 05-04-1945 DOA: 10/11/2011 PCP: [name], MD  Assessment/Plan: A: Acute respiratory failure. Most likely due to acute pulmonary edema secondary to congestive heart failure, hypertensive emergency, fluid overload, in a patient with end-stage renal disease - does not have an outpatient unit for dialysis. Patient was admitted and intubated on 7/29 and extubated on 8/1. The patient received hemodialysis every day which helped her extubation    Patient had a non-Q-wave myocardial infarction also known as type II MI - she was seen by Dr. Sharyn Lull from cardiology and he did not recommend any further workup. The patient was treated with Asa, beta blocker . Echo reveals good EF but mild (gr 1) diastolic dysfunction. Pt states she had a stress test at [facility] about 5 mo ago which was negative.    A: ESRD on HD HD per Renal now ongoing. Dr. Bascom Levels is attempting to find an outpatient hemodialysis unit for Mrs. Harris. We are pursuing [address], [place], [facility]    A: Suspected HCAP / aspiration pneumonia. - very mild the patient was started on broad-spectrum antibiotics: Vancomycin 7/29 >>>7/31 Zosyn 7/29 >>>8/1 , CTX 8/1 PO levaquin- started on August 3-finished 8/6     DM. Patient was kept on sliding scale insulin while on the ventilator We did Resume Prandin once she was extubated   Patient underwent  psychiatric evaluation for capacity as required by [employer] to be able to secure a spot in an outpatient hemodialysis unit  Code Status: full code Disposition Plan: Home Family communication-[place],Dr. Joni Reining Daughter [phone]    Brief narrative: 66 yo ESRD on HD admitted on 7/29 with dyspnea and transferred to ICU due to acute respiratory distress in the middle of the night. In ED: BiPAP started, given Vancomycin / Zosyn. Emergent HD upon  admission. Troponin elevated. Pt was eventually intubated. Extubated on 8/1  Consultants:  Cardiology - Harwani  Nephrology: Bascom Levels   HPI/Subjective: No new complaints  Objective: Filed Vitals:   10/18/11 2100 10/18/11 2232 10/19/11 0533 10/19/11 1503  BP: 146/57 135/71 163/55 146/54  Pulse: 71  73 68  Temp: 98.2 F (36.8 C)  98.1 F (36.7 C) 97.2 F (36.2 C)  TempSrc: Oral  Oral Oral  Resp:   17 18  Height:      Weight:   55.7 kg (122 lb 12.7 oz)   SpO2: 98%  97% 98%    Intake/Output Summary (Last 24 hours) at 10/19/11 1850 Last data filed at 10/19/11 1843  Gross per 24 hour  Intake    680 ml  Output      0 ml  Net    680 ml    Exam: Alert and oriented x3 Chest clear to auscultation Heart regular rate and rhythm Abdomen soft nontender   Data Reviewed: Basic Metabolic Panel:  Lab 10/18/11 8469 10/15/11 0707 10/14/11 0700 10/13/11 0310  NA 132* 138 135 139  K 3.6 3.6 3.7 4.6  CL 92* 97 93* 98  CO2 25 26 27  32  GLUCOSE 170* 73 94 81  BUN 68* 29* 12 11  CREATININE 9.41* 5.33* 2.78* 2.56*  CALCIUM 9.9 9.0 9.6 9.4  MG -- 2.3 -- --  PHOS 2.7 5.2* -- --   Liver Function Tests:  Lab 10/18/11 0957 10/15/11 0707 10/14/11 0700 10/13/11 0310  AST -- -- 59* 39*  ALT -- -- 39* 32  ALKPHOS -- -- 112 98  BILITOT -- --  0.4 0.5  PROT -- -- 7.4 6.8  ALBUMIN 3.1* 3.3* 3.7 3.6   No results found for this basename: LIPASE:5,AMYLASE:5 in the last 168 hours No results found for this basename: AMMONIA:5 in the last 168 hours CBC:  Lab 10/18/11 0957 10/15/11 0707 10/14/11 0450 10/13/11 0310  WBC 7.1 9.9 6.9 7.6  NEUTROABS -- 6.3 3.4 3.6  HGB 8.2* 8.0* 8.8* 7.8*  HCT 25.5* 25.3* 29.0* 25.1*  MCV 73.3* 74.6* 75.1* 75.4*  PLT 221 167 145* 133*   Cardiac Enzymes:  Lab 10/14/11 0001 10/13/11 1213 10/13/11 0305  CKTOTAL 1026* 915* 306*  CKMB 6.0* 5.9* 4.3*  CKMBINDEX -- -- --  TROPONINI 0.87* 0.87* 0.90*   BNP (last 3 results)  Basename 10/15/11 0707    PROBNP 31669.0*   CBG:  Lab 10/19/11 1719 10/19/11 1200 10/19/11 0631 10/18/11 2124 10/18/11 1742  GLUCAP 80 123* 85 104* 125*       Studies: Dg Chest 1 View  10/11/2011  *RADIOLOGY REPORT*  Clinical Data: Shortness of breath.  Status post dialysis.  CHEST - 1 VIEW  Comparison: Earlier today.  Findings: The pulmonary vasculature and interstitial markings are less prominent.  The cardiac silhouette remains borderline enlarged.  Stable right jugular double-lumen catheter and thoracic spine degenerative changes.  IMPRESSION: Improving changes of congestive heart failure.  Original Report Authenticated By: Darrol Angel, M.D.   Ct Head Wo Contrast  10/14/2011  *RADIOLOGY REPORT*  Clinical Data: Altered mental status.  Rule out stroke  CT HEAD WITHOUT CONTRAST  Technique:  Contiguous axial images were obtained from the base of the skull through the vertex without contrast.  Comparison: None.  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 Chronic ischemia in the left frontal white matter is present.  No acute infarct.  Negative for hemorrhage or mass.  Image quality degraded by motion.  Multiple images were repeated due to motion.  IMPRESSION: Chronic left frontal infarct.  No acute abnormality.  Original Report Authenticated By: Camelia Phenes, M.D.   Dg Chest Port 1 View  10/15/2011  *RADIOLOGY REPORT*  Clinical Data: Extubated with renal insufficiency.  PORTABLE CHEST - 1 VIEW  Comparison: 1 day prior  Findings: Interval extubation.  Dialysis catheter unchanged, with tip at high right atrium.  A catheter projects over the upper chest and lower neck.  The nasogastric tube is no longer identified along the course of the esophagus.  Normal heart size.  Possible trace right pleural fluid. No pneumothorax.  Persistent mild pulmonary venous congestion and developing interstitial edema. No lobar consolidation.  IMPRESSION:  1.  Slight increase in the pulmonary venous congestion and  developing interstitial edema. 2.  Extubation.  Presumed removal of nasogastric tube.  There is a catheter which projects over the upper chest and lower neck.  This is presumably external to the patient.  Original Report Authenticated By: Consuello Bossier, M.D.         . allopurinol  100 mg Oral Daily  . amLODipine  10 mg Oral Daily  . antiseptic oral rinse  15 mL Mouth Rinse QID  . aspirin  324 mg Oral Once  . atorvastatin  20 mg Per Tube q1800  . calcium acetate  667 mg Oral QID  . chlorhexidine  15 mL Mouth/Throat BID  . cloNIDine  0.1 mg Transdermal Weekly  . diclofenac sodium   Topical QID  . docusate sodium  200 mg Oral QHS  . epoetin (EPOGEN/PROCRIT) injection  10,000 Units  Intravenous Q M,W,F-HD  . heparin flush  20 Units Intracatheter Q12H  . heparin subcutaneous  5,000 Units Subcutaneous Q8H  . hydrALAZINE  50 mg Oral Q8H  . insulin aspart  0-15 Units Subcutaneous TID AC & HS  . l-methylfolate-B6-B12  1 tablet Oral BID  . levofloxacin  500 mg Oral Q48H  . losartan  100 mg Oral QHS  . metoprolol succinate  100 mg Oral BID  . multivitamin  1 tablet Oral Daily  . paricalcitol  1 mcg Intravenous Q M,W,F-HD  . repaglinide  0.5 mg Oral TID AC    ________________________________________________________________________    [facility] Pager [phone] If 7PM-7AM, please contact night-coverage at [email or web address], password [facility] 10/19/2011, 6:50 PM  LOS: 8 days

## 2011-10-19 NOTE — Progress Notes (Signed)
Physical Therapy Treatment Patient Details Name: Alexandra [name] MRN: [id number] DOB: 07-Aug-1945 Today's Date: 10/19/2011 Time: 0910-0949 PT Time Calculation (min): 39 min  PT Assessment / Plan / Recommendation Comments on Treatment Session  Making good progress with mobility/ambulation distance; Responds well to cues for upright posture; Pt's posture is more related to upperback weakness and decr muscle endurance    Follow Up Recommendations  Home health PT    Barriers to Discharge        Equipment Recommendations  Rolling walker with 5" wheels;3 in 1 bedside comode    Recommendations for Other Services OT consult  Frequency Min 3X/week   Plan Discharge plan remains appropriate    Precautions / Restrictions Precautions Precautions: Fall   Pertinent Vitals/Pain Some Right shoulder soreness; Did not rate    Mobility  Bed Mobility Bed Mobility: Supine to Sit;Sitting - Scoot to Edge of Bed Supine to Sit: 5: Supervision;HOB elevated (slightly elevated) Sitting - Scoot to Edge of Bed: 5: Supervision Details for Bed Mobility Assistance: Verbal cues for technique; no need for physical assist Transfers Transfers: Sit to Stand;Stand to Sit Sit to Stand: 4: Min guard;From bed;With upper extremity assist Stand to Sit: 4: Min guard;To toilet;With upper extremity assist (held grab bar to left of commode) Details for Transfer Assistance: Cues for safe hand placement; did not need physical assist or assist to steady RW Ambulation/Gait Ambulation/Gait Assistance: 4: Min guard;5: Supervision Ambulation Distance (Feet): 200 Feet (over) Assistive device: Rolling walker Ambulation/Gait Assistance Details: Minguard assistance progressing to supervision; Periodic cues to stand upright, extend upper trunk for more upright posture; discussed posture as a habit and the need to not only strengthen back postural musculature, but also to work on muscle endurance and chest opening stretches Gait  Pattern: Decreased stride length Gait velocity: slowed General Gait Details: Pt very gregarious today and willing to stop and talk with staff and other pts; Performed some small, quick turns with RW without loss of balance    Exercises     PT Diagnosis:    PT Problem List:   PT Treatment Interventions:     PT Goals Acute Rehab PT Goals Time For Goal Achievement: 10/31/11 Potential to Achieve Goals: Good Pt will go Supine/Side to Sit: with modified independence PT Goal: Supine/Side to Sit - Progress: Progressing toward goal Pt will go Sit to Stand: with modified independence PT Goal: Sit to Stand - Progress: Progressing toward goal Pt will go Stand to Sit: with modified independence PT Goal: Stand to Sit - Progress: Progressing toward goal Pt will Ambulate: >150 feet;with modified independence;with rolling walker;with least restrictive assistive device PT Goal: Ambulate - Progress: Progressing toward goal Pt will Perform Home Exercise Program: Independently PT Goal: Perform Home Exercise Program - Progress: Progressing toward goal  Visit Information  Last PT Received On: 10/19/11 Assistance Needed: +1    Subjective Data  Subjective: Wants to walk; is concerned about LE weakness/posture   Cognition  Overall Cognitive Status: Appears within functional limits for tasks assessed/performed Arousal/Alertness: Awake/alert Orientation Level: Appears intact for tasks assessed Behavior During Session: [facility] for tasks performed    Balance     End of Session PT - End of Session Activity Tolerance: Patient tolerated treatment well Patient left: Other (comment);with call bell/phone within reach (in bathroom with pullstring within reach; RN aware) Nurse Communication: Mobility status   GP     Alexandra [name] [facility] [place], [place] [phone]  10/19/2011, 12:35 PM

## 2011-10-20 ENCOUNTER — Inpatient Hospital Stay (HOSPITAL_COMMUNITY): Payer: Medicare HMO

## 2011-10-20 DIAGNOSIS — I5032 Chronic diastolic (congestive) heart failure: Secondary | ICD-10-CM

## 2011-10-20 DIAGNOSIS — I5033 Acute on chronic diastolic (congestive) heart failure: Secondary | ICD-10-CM

## 2011-10-20 DIAGNOSIS — I509 Heart failure, unspecified: Secondary | ICD-10-CM

## 2011-10-20 LAB — GLUCOSE, CAPILLARY
Glucose-Capillary: 151 mg/dL — ABNORMAL HIGH (ref 70–99)
Glucose-Capillary: 148 mg/dL — ABNORMAL HIGH (ref 70–99)

## 2011-10-20 LAB — CBC WITH DIFFERENTIAL/PLATELET
Basophils Relative: 1 % (ref 0–1)
Eosinophils Relative: 4 % (ref 0–5)
HCT: 25.7 % — ABNORMAL LOW (ref 36.0–46.0)
Lymphs Abs: 2.7 10*3/uL (ref 0.7–4.0)
MCH: 23.1 pg — ABNORMAL LOW (ref 26.0–34.0)
MCV: 74.3 fL — ABNORMAL LOW (ref 78.0–100.0)
Monocytes Absolute: 1.2 10*3/uL — ABNORMAL HIGH (ref 0.1–1.0)
Monocytes Relative: 18 % — ABNORMAL HIGH (ref 3–12)
Neutro Abs: 2.4 10*3/uL (ref 1.7–7.7)
Platelets: 187 10*3/uL (ref 150–400)
RBC: 3.46 MIL/uL — ABNORMAL LOW (ref 3.87–5.11)

## 2011-10-20 LAB — RENAL FUNCTION PANEL
Albumin: 3.2 g/dL — ABNORMAL LOW (ref 3.5–5.2)
BUN: 51 mg/dL — ABNORMAL HIGH (ref 6–23)
Chloride: 98 mEq/L (ref 96–112)
Creatinine, Ser: 7.39 mg/dL — ABNORMAL HIGH (ref 0.50–1.10)
Glucose, Bld: 99 mg/dL (ref 70–99)

## 2011-10-20 MED ORDER — ATORVASTATIN CALCIUM 20 MG PO TABS: 20.0000 mg | ORAL_TABLET | Freq: Every day | ORAL | Status: DC

## 2011-10-20 MED ORDER — NYSTATIN-TRIAMCINOLONE 100000-0.1 UNIT/GM-% EX CREA
TOPICAL_CREAM | Freq: Two times a day (BID) | CUTANEOUS | Status: DC
  Administered 2011-10-20 – 2011-10-21 (×4): via TOPICAL
  Filled 2011-10-20: qty 15

## 2011-10-20 MED ORDER — ASPIRIN 81 MG PO CHEW: 324.0000 mg | CHEWABLE_TABLET | Freq: Once | ORAL | Status: DC

## 2011-10-20 MED ORDER — PARICALCITOL 5 MCG/ML IV SOLN: 1.0000 ug | INTRAVENOUS | Status: DC

## 2011-10-20 MED ORDER — PARICALCITOL 5 MCG/ML IV SOLN
INTRAVENOUS | Status: AC
  Administered 2011-10-20: 1 ug via INTRAVENOUS
  Filled 2011-10-20: qty 1

## 2011-10-20 NOTE — Discharge Summary (Signed)
Physician Discharge Summary  Alexandra [name] ZOX:[id number] DOB: October 12, 1945 DOA: 10/11/2011  PCP: [name], MD  Admit date: 10/11/2011 Discharge date: 10/20/2011  Discharge Diagnoses:  Acute respiratory failure. 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  Disposition:  Follow-up Information    Follow up with [name], MD in 2 weeks.   Contact information:   [address] [facility] [place] [address] [phone]          Diet: Continue renal diet Wt Readings from Last 3 Encounters:  10/20/11 55.2 kg (121 lb 11.1 oz)  10/08/11 58.514 kg (129 lb)  10/09/11 57.75 kg (127 lb 5.1 oz)    History of present illness:  66 y/o female with ESRD on HD who presents with sudden onset SOB during the night (approximately 3:00am). Patient states that prior to this she had no complaints was feeling well prior to going to bed last night. The patient says she's been compliant with her dialysis. She denies CP, Chills, cough, dizziness, nausea, vomiting, diarrhea.  In the emergency room the patient was resuscitated with BiPAP until she can receive dialysis. She did receive a dose of vancomycin and Zosyn as she has a mildly elevated white blood cell count and difficulty with breathing with chest x-ray that could represent either atelectasis or consolidation. Rested with the patient for further evaluation and treatment. Please note that the patient is dialyzed on Tuesdays, Thursdays and Saturdays.   Hospital Course:  Active Problems: -Acute respiratory failure.  -Most likely due to acute pulmonary edema  secondary to congestive heart failure, hypertensive emergency, fluid overload, in a patient with end-stage renal disease.she was admitted to the hospital 7/29 intubated 8/1. She had daily dialysis until her shortness of breath improved.   Non-Q-wave myocardial infarction also known as type II MI  -she was seen by Dr. Sharyn Lull from cardiology and he did not recommend any further workup. ASA and beta blocker . 2-D echo was done that showed no wall motion abnormality.  Hypertensive CKD, ESRD on dialysis: -As the patient did not have a dialysis unit. Her nephrologist Dr. Sherrie Mustache attempted to get her a dialysis spot [place] [facility] [place] the [place]. The patient will followup with her dialysis on Monday Wednesday [place]. -She was not limited by a psychiatrist, who recommended the patient DC home and she has capacity to make decisions about her medical treatment. -Continue clonidine, Cozaar and Norvasc. Her blood pressures remained stable and well controlled after dialysis.   ESRD on hemodialysis: -She'll continue her dialysis and outpatient Monday Wednesday and Friday.  Suspected HCAP / aspiration pneumonia.  - very mild the patient was started on broad-spectrum antibiotics: Vancomycin 7/29 >>>7/31 Zosyn 7/29 >>>8/1 , CTX 8/1  -PO levaquin and finish her course of antibiotic in-house.  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 She will go home and her dose regimen to as below.  Leukocytosis: At the beginning of her  admission was thought it was most likely stress emargination induced. She had an infiltrate on chest x-ray his concern for health care associated pneumonia she was treated empirically. Her Zeitels is resolved. Cultures were done which  continued to be negative today  Chronic diastolic CHF (congestive heart failure): Ejection fraction 55-60% with grade 1 diastolic heart failure, mostly secondary to hypertension. Cardiology was consulted and continue to follow the patient as an outpatient and initiate medications as tolerated. -She will continue her beta blocker, ARB.   Discharge Exam: Filed Vitals:   10/20/11 1213  BP: 174/56  Pulse: 78  Temp: 98.7 F (37.1 C)  Resp: 17   Filed Vitals:   10/20/11 1030 10/20/11 1100 10/20/11 1130 10/20/11 1213  BP: 146/55 147/59 165/53 174/56  Pulse: 73 76 81 78  Temp:    98.7 F (37.1 C)  TempSrc:    Oral  Resp: 15  16 17   Height:      Weight:    55.2 kg (121 lb 11.1 oz)  SpO2:    98%   General: Awake alert and oriented times Cardiovascular: Regular rate and rhythm with positive S1 and S2 Respiratory: Good air movement clear to auscultation  Discharge Instructions  Discharge Orders    Future Orders Please Complete By Expires   Diet - low sodium heart healthy      Increase activity slowly        Medication List  As of 10/20/2011  1:14 PM   TAKE these medications         allopurinol 100 MG tablet   Commonly known as: ZYLOPRIM   Take 100 mg by mouth daily.      amLODipine 5 MG tablet   Commonly known as: NORVASC   Take 10 mg by mouth daily.      aspirin 81 MG chewable tablet   Chew 4 tablets (324 mg total) by mouth once.      atorvastatin 20 MG tablet   Commonly known as: LIPITOR   Place 1 tablet (20 mg total) into feeding tube daily at 6 PM.      calcium acetate 667 MG capsule   Commonly known as: PHOSLO   Take 667 mg by mouth 4 (four) times daily.      cloNIDine 0.1 mg/24hr patch   Commonly known as: CATAPRES - Dosed in mg/24 hr   Place 1 patch onto the skin once a week. Changes on friday      Coenzyme Q10 150 MG Caps   Take 300 mg by mouth daily. Hold while in hospital      docusate sodium 100 MG capsule   Commonly known as: COLACE   Take 200 mg  by mouth at bedtime.      hydrALAZINE 25 MG tablet   Commonly known as: APRESOLINE   Take 50 mg by mouth 3 (three) times daily.      l-methylfolate-B6-B12 3-35-2 MG Tabs   Commonly known as: METANX   Take 1 tablet by mouth 2 (two) times daily.      losartan 50 MG tablet   Commonly known as: COZAAR   Take 100 mg by mouth at bedtime.      metoprolol succinate 50 MG 24 hr tablet   Commonly known as: TOPROL-XL   Take 100 mg by mouth 2 (two) times daily. Takes after dialysis (T, Th, Sat)      multivitamin Tabs tablet   Take 1 tablet by mouth daily.      paricalcitol  5 MCG/ML injection   Commonly known as: ZEMPLAR   Inject 0.2 mLs (1 mcg total) into the vein every Monday, Wednesday, and Friday with hemodialysis.      repaglinide 0.5 MG tablet   Commonly known as: PRANDIN   Take 0.5 mg by mouth 3 (three) times daily before meals.              The results of significant diagnostics from this hospitalization (including imaging, microbiology, ancillary and laboratory) are listed below for reference.    Significant Diagnostic Studies: Dg Chest 1 View  10/11/2011  *RADIOLOGY REPORT*  Clinical Data: Shortness of breath.  Status post dialysis.  CHEST - 1 VIEW  Comparison: Earlier today.  Findings: The pulmonary vasculature and interstitial markings are less prominent.  The cardiac silhouette remains borderline enlarged.  Stable right jugular double-lumen catheter and thoracic spine degenerative changes.  IMPRESSION: Improving changes of congestive heart failure.  Original Report Authenticated By: Darrol Angel, M.D.   Dg Shoulder Right  10/17/2011  *RADIOLOGY REPORT*  Clinical Data: Right shoulder pain.  RIGHT SHOULDER - 2+ VIEW  Comparison: Portable chest 10/15/2011  Findings: Right shoulder is located.  Degenerative changes are present in the [facility] joint.  Minimal calcifications are likely associated with a right subclavian artery.  A right IJ dialysis catheter is stable.  The right  hemithorax is otherwise clear. Previously seen pulmonary vascular congestion is improved.  IMPRESSION:  1.  No acute abnormality. 2.  Stable degenerative changes of the right AC joint. 3.  Improved pulmonary vascular congestion. 4.  Right subclavian artery calcifications.  Original Report Authenticated By: Jamesetta Orleans. MATTERN, M.D.   Ct Head Wo Contrast  10/14/2011  *RADIOLOGY REPORT*  Clinical Data: Altered mental status.  Rule out stroke  CT HEAD WITHOUT CONTRAST  Technique:  Contiguous axial images were obtained from the base of the skull through the vertex without contrast.  Comparison: None.  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 Chronic ischemia in the left frontal white matter is present.  No acute infarct.  Negative for hemorrhage or mass.  Image quality degraded by motion.  Multiple images were repeated due to motion.  IMPRESSION: Chronic left frontal infarct.  No acute abnormality.  Original Report Authenticated By: Camelia Phenes, M.D.   Dg Chest Port 1 View  10/15/2011  *RADIOLOGY REPORT*  Clinical Data: Extubated with renal insufficiency.  PORTABLE CHEST - 1 VIEW  Comparison: 1 day prior  Findings: Interval extubation.  Dialysis catheter unchanged, with tip at high right atrium.  A catheter projects over the upper chest and lower neck.  The nasogastric tube is no longer identified along the course of the esophagus.  Normal heart size.  Possible trace right pleural fluid. No pneumothorax.  Persistent mild pulmonary venous congestion and developing interstitial edema. No lobar consolidation.  IMPRESSION:  1.  Slight increase in the pulmonary venous congestion and developing interstitial edema. 2.  Extubation.  Presumed removal of nasogastric tube.  There is a catheter which projects over the upper chest and lower neck.  This is presumably external to the patient.  Original Report Authenticated By: Consuello Bossier, M.D.   Dg Chest Port 1 View  10/14/2011  *RADIOLOGY REPORT*   Clinical Data: Assess pulmonary edema.  Hemodialysis with pulmonary congestion  PORTABLE CHEST - 1 VIEW  Comparison: 10/13/2011  Findings: Endotracheal tube is 4.5 cm above the level of the carina, stable in position.  Hemodialysis catheter is in place via a  right subclavian approach with the tip located overlying the distal superior vena cava at the level of the superior cavoatrial junction.  A nasogastric tube is in place and the tip is below the level of the hemidiaphragms and not visualized on this exam.  Heart and mediastinal contours are stable.  There has been an interval increase in pulmonary vascular congestion, mild central peribronchial cuffing, fissural prominence and interstitial septal lines compatible with interval development of very mild interstitial edema.  Question small left pleural effusion. Scarring changes at the left lung base are seen with no overt congestive failure or focal infiltrates identified.  IMPRESSION: Interval development of mild interstitial edema pattern with new small left pleural effusion.  Original Report Authenticated By: Bertha Stakes, M.D.   Dg Chest Port 1 View  10/13/2011  *RADIOLOGY REPORT*  Clinical Data: Hemodialysis.  Pulmonary congestion.  PORTABLE CHEST - 1 VIEW  Comparison: 10/13/2011, 0708 hours.  Findings: Improved pulmonary vascular congestion and basilar edema. Cardiopericardial silhouette appears within normal limits. Endotracheal tube remains present with the tip about 5 cm from the carina.  Enteric tube and dialysis catheter unchanged.  IMPRESSION: Improved pulmonary aeration compatible with decreasing edema and vascular congestion after hemodialysis.  Stable support apparatus.  Original Report Authenticated By: Andreas Newport, M.D.   Dg Chest Port 1 View  10/13/2011  *RADIOLOGY REPORT*  Clinical Data: End-stage renal disease, edema, ventilatory support  PORTABLE CHEST - 1 VIEW  Comparison: 10/12/2011  Findings: Stable support apparatus.  Mild  vascular and interstitial prominence compatible with interstitial edema.  Basilar atelectasis evident.  Small effusions suspected.  No pneumothorax demonstrated.  IMPRESSION: Stable mild edema pattern.  Original Report Authenticated By: Judie Petit. Ruel Favors, M.D.   Dg Chest Port 1 View  10/12/2011  *RADIOLOGY REPORT*  Clinical Data: Assess endotracheal tube.  PORTABLE CHEST - 1 VIEW  Comparison: 10/11/2011  Findings: Endotracheal tube tip 4.7 cm proximal to the carina. Bibasilar opacities.  Cardiomediastinal contours unchanged.  No definite pneumothorax.  Small effusions not excluded.  Right IJ catheter tip projects over the mid SVC.  IMPRESSION: Endotracheal tube tip 4.7 cm proximal to the carina.  Bibasilar opacities; atelectasis, aspiration, or infiltrate.  Original Report Authenticated By: Waneta Martins, M.D.   Dg Chest Portable 1 View  10/11/2011  **ADDENDUM** CREATED: 10/11/2011 11:35:10  Addendum for further clarification:  The patient was discussed with Dr Ashley Royalty.  There are mildly increased interstitial markings, much of which is chronic, although mild volume overload is suspected.  The bibasilar opacities likely reflect atelectasis in the absence of fever or other infectious symptoms.  **END ADDENDUM** SIGNED BY: Charline Bills, M.D.   10/11/2011  *RADIOLOGY REPORT*  Clinical Data: Shortness of breath  PORTABLE CHEST - 1 VIEW  Comparison: 07/12/2011  Findings: Mild patchy bilateral lower lobe opacities, atelectasis versus pneumonia.  No frank interstitial edema.  No pneumothorax.  The heart is top normal in size.  Stable right IJ dual lumen dialysis catheter.  IMPRESSION: Mild patchy bilateral lower lobe opacities, atelectasis versus pneumonia.  Original Report Authenticated By: Charline Bills, M.D.   Dg Abd Portable 1v  10/12/2011  *RADIOLOGY REPORT*  Clinical Data: Rule out ileus.  History of renal failure.  PORTABLE ABDOMEN - 1 VIEW  Comparison: None.  Findings: NG tube tip is in the  antrum of the stomach.  Moderate stool in the ascending colon.  No disproportionate dilatation of small bowel.  No obvious free intraperitoneal gas.  IMPRESSION: NG tube tip in antrum of  the stomach.  Moderate stool in the ascending colon.  Nonobstructive bowel gas pattern.  Original Report Authenticated By: Donavan Burnet, M.D.    Microbiology: Recent Results (from the past 240 hour(s))  MRSA PCR SCREENING     Status: Normal   Collection Time   10/12/11  1:00 AM      Component Value Range Status Comment   MRSA by PCR NEGATIVE  NEGATIVE Final   CULTURE, RESPIRATORY     Status: Normal   Collection Time   10/12/11  4:36 AM      Component Value Range Status Comment   Specimen Description TRACHEAL ASPIRATE   Final    Special Requests NONE   Final    Gram Stain     Final    Value: NO WBC SEEN     NO SQUAMOUS EPITHELIAL CELLS SEEN     NO ORGANISMS SEEN   Culture Non-Pathogenic Oropharyngeal-type Flora Isolated.   Final    Report Status 10/14/2011 FINAL   Final   CULTURE, BLOOD (SINGLE)     Status: Normal   Collection Time   10/13/11  2:15 PM      Component Value Range Status Comment   Specimen Description BLOOD HEMODIALYSIS CATHETER   Final    Special Requests BOTTLES DRAWN AEROBIC AND ANAEROBIC 10CC   Final    Culture  Setup Time 10/13/2011 20:18   Final    Culture NO GROWTH 5 DAYS   Final    Report Status 10/19/2011 FINAL   Final      Labs: Basic Metabolic Panel:  Lab 10/20/11 1610 10/18/11 0957 10/15/11 0707 10/14/11 0700  NA 138 132* 138 135  K 4.0 3.6 -- --  CL 98 92* 97 93*  CO2 27 25 26 27   GLUCOSE 99 170* 73 94  BUN 51* 68* 29* 12  CREATININE 7.39* 9.41* 5.33* 2.78*  CALCIUM 10.2 9.9 9.0 9.6  MG -- -- 2.3 --  PHOS 2.9 2.7 5.2* --   Liver Function Tests:  Lab 10/20/11 0807 10/18/11 0957 10/15/11 0707 10/14/11 0700  AST -- -- -- 59*  ALT -- -- -- 39*  ALKPHOS -- -- -- 112  BILITOT -- -- -- 0.4  PROT -- -- -- 7.4  ALBUMIN 3.2* 3.1* 3.3* 3.7   No results found for  this basename: LIPASE:5,AMYLASE:5 in the last 168 hours No results found for this basename: AMMONIA:5 in the last 168 hours CBC:  Lab 10/20/11 0808 10/18/11 0957 10/15/11 0707 10/14/11 0450  WBC 6.7 7.1 9.9 6.9  NEUTROABS 2.4 -- 6.3 3.4  HGB 8.0* 8.2* 8.0* 8.8*  HCT 25.7* 25.5* 25.3* 29.0*  MCV 74.3* 73.3* 74.6* 75.1*  PLT 187 221 167 145*   Cardiac Enzymes:  Lab 10/14/11 0001  CKTOTAL 1026*  CKMB 6.0*  CKMBINDEX --  TROPONINI 0.87*   BNP: No components found with this basename: POCBNP:5 CBG:  Lab 10/20/11 1302 10/20/11 0738 10/19/11 2121 10/19/11 1719 10/19/11 1200  GLUCAP 109* 99 150* 80 123*    Time coordinating discharge: 45 minute  Signed:  Marinda Elk  [facility] 10/20/2011, 1:14 PM

## 2011-10-20 NOTE — Discharge Summary (Signed)
Patient was admitted on 10/02/11 for scheduled hemodialysis by me. After completion she has presumably left. Patient was not seen by me at the time of discharge. She is follow up with Dr. Tenny Craw her PCP and Dr. Bascom Levels her nephrologist.

## 2011-10-20 NOTE — Progress Notes (Signed)
Patient Alexandra [name], 66 year old [place] American female expressed appreciation for Chaplain's provision of pastoral presence, prayer, and conversation.  I will follow up as needed.

## 2011-10-20 NOTE — Consult Note (Signed)
  Pt. Seen in dialysis. She feels much better. Attempting to get pt. Into an outpatient unit. Pt. Will also need outpatient PT.  To regain strength .

## 2011-10-20 NOTE — Progress Notes (Signed)
Agree with OT note; Thanks, [place], [place] [phone]

## 2011-10-20 NOTE — Progress Notes (Signed)
Patient refuse bedalrm

## 2011-10-20 NOTE — Progress Notes (Signed)
PT/OT Cancellation Note  Treatment cancelled today due to patient receiving procedure or test: pt in HD. Will check back as schedule allows. Thank you. Glendale Chard, OTR/L Pager: [phone] 10/20/2011    Elyn Krogh 10/20/2011, 9:19 AM

## 2011-10-20 NOTE — Consult Note (Signed)
Reviewed note and discussed case with Psych MD along with attending: Lavera Guise.  No recommendation for Psych CSW to follow up. Patient to dc home, has capacity to make decisions regarding medical treatment and wants to go home as she is eager to resume care for patients.    Patient plan: anticipated to dc home when medically stable. No outpatient recommended.  Will sign off.  Ashley Jacobs, MSW LCSW [phone]

## 2011-10-20 NOTE — Progress Notes (Signed)
Physical Therapy Note   Continuing to follow [name] for mobility, posture, and activity tolerance  Initially we were recommending pt dc home with HHPT to follow at home, however pt and daughter's concerns re: dc home are acknowledged   Continued rehab at SNF level is a viable option from a PT standpoint, to work on high level balance, posture, and activity tolerance, all of these are integral pieces of being able to manage at home   Will update dc plan to include SNF for rehab to maximize independence and safety with mobility, transfers, amb, activity tolerance prior to dc home  Thank you,  Van Clines, [place] [phone]

## 2011-10-20 NOTE — Progress Notes (Signed)
[facility] PROGRESS NOTE  Gyselle Matthew Offutt YNW:[id number] DOB: May 11, 1945 DOA: 10/11/2011 PCP: [name], MD  Assessment/Plan: A: Acute respiratory failure.  -Most likely due to acute pulmonary edema secondary to congestive heart failure, hypertensive emergency, fluid overload, in a patient with end-stage renal disease.-Non-Q-wave myocardial infarction also known as type II MI - she was seen by Dr. Sharyn Lull from cardiology and he did not recommend any further workup. ASA and beta blocker . -Does not have an outpatient unit for dialysis.  -Patient was admitted and intubated on 7/29 and extubated on 8/1.  Chronic diastolic Heart Failure: -Echo reveals good EF but mild (gr 1) diastolic dysfunction. Pt states she had a stress test at [facility] about 5 mo ago which was negative.   ESRD on HD -HD per Renal now ongoing.  -Dr. Bascom Levels is attempting to find an outpatient hemodialysis unit for [name]  -We are pursuing [address], [place], [facility].   Suspected HCAP / aspiration pneumonia.  - very mild the patient was started on broad-spectrum antibiotics: Vancomycin 7/29 >>>7/31 Zosyn 7/29 >>>8/1 , CTX 8/1 -PO levaquin- started on August 3-finished 8/6    DM -Patient was kept on sliding scale insulin while on the ventilator -We did Resume Prandin once she was extubated  ETHICS: Patient underwent  psychiatric evaluation for capacity as required by [employer] to be able to secure a spot in an outpatient hemodialysis unit  Code Status: full code Disposition Plan: Home Family communication-[place],Dr. Joni Reining Daughter [phone]    Brief narrative: 66 yo ESRD on HD admitted on 7/29 with dyspnea and transferred to ICU due to acute respiratory distress in the middle of the night. In ED: BiPAP started, given Vancomycin / Zosyn. Emergent HD upon admission. Troponin elevated. Pt was eventually intubated. Extubated on  8/1  Consultants:  Cardiology - Harwani  Nephrology: Bascom Levels   HPI/Subjective: No new complaints  Objective: Filed Vitals:   10/19/11 2226 10/20/11 0435 10/20/11 0510 10/20/11 0759  BP: 147/55  178/57 161/70  Pulse: 75  73 66  Temp: 98.1 F (36.7 C)  98.2 F (36.8 C) 98.5 F (36.9 C)  TempSrc: Oral  Oral Oral  Resp: 18  20 20   Height:      Weight:  55.7 kg (122 lb 12.7 oz)  55.7 kg (122 lb 12.7 oz)  SpO2: 97%  97% 96%    Intake/Output Summary (Last 24 hours) at 10/20/11 0843 Last data filed at 10/19/11 2100  Gross per 24 hour  Intake    800 ml  Output      0 ml  Net    800 ml    Exam: General: Alert and oriented x3 Chest: clear to auscultation Heart: regular rate and rhythm Abdomen: soft nontender   Data Reviewed: Basic Metabolic Panel:  Lab 10/18/11 1027 10/15/11 0707 10/14/11 0700  NA 132* 138 135  K 3.6 3.6 3.7  CL 92* 97 93*  CO2 25 26 27   GLUCOSE 170* 73 94  BUN 68* 29* 12  CREATININE 9.41* 5.33* 2.78*  CALCIUM 9.9 9.0 9.6  MG -- 2.3 --  PHOS 2.7 5.2* --   Liver Function Tests:  Lab 10/18/11 0957 10/15/11 0707 10/14/11 0700  AST -- -- 59*  ALT -- -- 39*  ALKPHOS -- -- 112  BILITOT -- -- 0.4  PROT -- -- 7.4  ALBUMIN 3.1* 3.3* 3.7   No results found for this basename: LIPASE:5,AMYLASE:5 in the last 168 hours No results found  for this basename: AMMONIA:5 in the last 168 hours CBC:  Lab 10/18/11 0957 10/15/11 0707 10/14/11 0450  WBC 7.1 9.9 6.9  NEUTROABS -- 6.3 3.4  HGB 8.2* 8.0* 8.8*  HCT 25.5* 25.3* 29.0*  MCV 73.3* 74.6* 75.1*  PLT 221 167 145*   Cardiac Enzymes:  Lab 10/14/11 0001 10/13/11 1213  CKTOTAL 1026* 915*  CKMB 6.0* 5.9*  CKMBINDEX -- --  TROPONINI 0.87* 0.87*   BNP (last 3 results)  Basename 10/15/11 0707  PROBNP 31669.0*   CBG:  Lab 10/20/11 0738 10/19/11 2121 10/19/11 1719 10/19/11 1200 10/19/11 0631  GLUCAP 99 150* 80 123* 85   Studies: Dg Chest 1 View  10/11/2011  *RADIOLOGY REPORT*  Clinical  Data: Shortness of breath.  Status post dialysis.  CHEST - 1 VIEW  Comparison: Earlier today.  Findings: The pulmonary vasculature and interstitial markings are less prominent.  The cardiac silhouette remains borderline enlarged.  Stable right jugular double-lumen catheter and thoracic spine degenerative changes.  IMPRESSION: Improving changes of congestive heart failure.  Original Report Authenticated By: Darrol Angel, M.D.   Ct Head Wo Contrast  10/14/2011  *RADIOLOGY REPORT*  Clinical Data: Altered mental status.  Rule out stroke  CT HEAD WITHOUT CONTRAST  Technique:  Contiguous axial images were obtained from the base of the skull through the vertex without contrast.  Comparison: None.  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 Chronic ischemia in the left frontal white matter is present.  No acute infarct.  Negative for hemorrhage or mass.  Image quality degraded by motion.  Multiple images were repeated due to motion.  IMPRESSION: Chronic left frontal infarct.  No acute abnormality.  Original Report Authenticated By: Camelia Phenes, M.D.   Dg Chest Port 1 View  10/15/2011  *RADIOLOGY REPORT*  Clinical Data: Extubated with renal insufficiency.  PORTABLE CHEST - 1 VIEW  Comparison: 1 day prior  Findings: Interval extubation.  Dialysis catheter unchanged, with tip at high right atrium.  A catheter projects over the upper chest and lower neck.  The nasogastric tube is no longer identified along the course of the esophagus.  Normal heart size.  Possible trace right pleural fluid. No pneumothorax.  Persistent mild pulmonary venous congestion and developing interstitial edema. No lobar consolidation.  IMPRESSION:  1.  Slight increase in the pulmonary venous congestion and developing interstitial edema. 2.  Extubation.  Presumed removal of nasogastric tube.  There is a catheter which projects over the upper chest and lower neck.  This is presumably external to the patient.  Original Report  Authenticated By: Consuello Bossier, M.D.      . allopurinol  100 mg Oral Daily  . amLODipine  10 mg Oral Daily  . antiseptic oral rinse  15 mL Mouth Rinse QID  . aspirin  324 mg Oral Once  . atorvastatin  20 mg Per Tube q1800  . calcium acetate  667 mg Oral QID  . chlorhexidine  15 mL Mouth/Throat BID  . cloNIDine  0.1 mg Transdermal Weekly  . diclofenac sodium   Topical QID  . docusate sodium  200 mg Oral QHS  . epoetin (EPOGEN/PROCRIT) injection  10,000 Units Intravenous Q M,W,F-HD  . heparin flush  20 Units Intracatheter Q12H  . heparin subcutaneous  5,000 Units Subcutaneous Q8H  . hydrALAZINE  50 mg Oral Q8H  . insulin aspart  0-15 Units Subcutaneous TID AC & HS  . l-methylfolate-B6-B12  1 tablet Oral BID  . losartan  100 mg Oral QHS  . metoprolol succinate  100 mg Oral BID  . multivitamin  1 tablet Oral Daily  . nystatin-triamcinolone   Topical BID  . paricalcitol  1 mcg Intravenous Q M,W,F-HD  . repaglinide  0.5 mg Oral TID AC  . DISCONTD: levofloxacin  500 mg Oral Q48H    ________________________________________________________________________    Marinda Elk  [facility] Pager [phone] If 7PM-7AM, please contact night-coverage at [email or web address], password [facility] 10/20/2011, 8:43 AM  LOS: 9 days

## 2011-10-21 LAB — GLUCOSE, CAPILLARY
Glucose-Capillary: 69 mg/dL — ABNORMAL LOW (ref 70–99)
Glucose-Capillary: 123 mg/dL — ABNORMAL HIGH (ref 70–99)

## 2011-10-21 MED ORDER — HEPARIN SODIUM (PORCINE) 1000 UNIT/ML DIALYSIS: 20.0000 [IU]/kg | INTRAMUSCULAR | Status: DC | PRN

## 2011-10-21 NOTE — Consult Note (Signed)
  Pt. Stable this AM may be discharged today. Dialysis Friday. See orders.

## 2011-10-21 NOTE — Progress Notes (Signed)
Patient is currently active with [facility] Care Management services.  Receiving RN [facility] Coordinator services only at this time.  Patient signed consents for service at bedside today.  Will receive a transition of care call with in 24-72 hours of discharge to review discharge plan of care.  Focus will continue on securing a outpatient dialysis center for ongoing treatment, consistent medical transportation, and nutrition. For any additional questions or new referrals please contact Anibal Henderson BSN RN [facility] Liaison at [phone].

## 2011-10-21 NOTE — Progress Notes (Signed)
Clinical Social Worker received notification from [facility] hospital liaison, Hortencia Pilar that pt case has been reviewed by [facility] and insurance will not authorize SNF placement as pt does not meet for skilled nursing facility criteria. Per Hortencia Pilar, recommendation is for [facility] RN/PT/OT and pt has been connected with [employer] home bridge program and stated that her phone number can be provided to pt if pt has questions. Clinical Social Worker and RNCM met with pt at bedside to notify. Pt asked for contact information for [employer] in order for pt to call insurance. Clinical Child psychotherapist provided phone number to [facility] hospital liaison, Hortencia Pilar. Clinical Social Worker to continue to follow.  Alexandra [name], MSW, LCSWA  Clinical Social Work [phone]

## 2011-10-21 NOTE — Progress Notes (Addendum)
Clinical Social Worker received notification from [facility] that pt daughter asked for information about transportation for pt to dialysis and meals on wheels be provided to pt. In addition, pt interested in rehab at [facility], but has [facility] and PT recommending HH services. Clinical Child psychotherapist met with pt at bedside and provided information on [employer] transportation along with application and pt stated that she still drives, but accepted resource and Visual merchandiser provided information about meals on wheels to pt. Clinical Social Worker discussed SNF placement with pt and that pt clinicals would be faxed to [facility] to determine if insurance is able to cover rehabilitation at [facility]. Pt agreeable to SNF search in [place] and [facility] list provided to pt.   Clinical Social Worker completed [employer], Engineer, agricultural, and initiated SNF search to [facility].   Clinical Child psychotherapist followed up with pt in regard to bed offers that are available at this time and pt is agreeable to [facility] and Rehab as an option in order to initiate insurance authorization and Visual merchandiser discussed with pt that according to [employer], Hortencia Pilar, authorization can be transferred to another facility if it is received and pt chooses another facility once more bed offers are available. Pt daughter, [name] contacted pt on pt cell phone during this Clinical Social Worker visit and Visual merchandiser discussed with pt daughter about the bed offers and having [employer] authorization process. Pt daughter reported to pt that pt daughter had been attempted to contact [facility], but insurance would not speak to pt daughter because they did not have HCPOA paperwork and pt asked for this Clinical Social Worker to provide [employer]. Clinical Social Worker provided Biochemist, clinical at bedside, but pt appearing tired at this  time and stated that she was going to rest and Clinical Social Worker left Advanced Directives packet at bedside.   Clinical Social Worker contacted [employer] and discussed pt situation and facility agreeable to initiated insurance authorization process. Clinical Social Worker contacted hospital [employer] liaison, Hortencia Pilar, who requested pt clinicals to attach to pt insurance case in hopes that it will move forward review in order to get response from [facility] to rather they will approve SNF placement. Clinical Social Worker updated pt at bedside. Clinical Social Worker awaiting response from [employer] to determine if insurance will cover SNF placement. Clinical Social Worker to continue to follow and facilitate pt discharge needs as appropriate.  Addendum 14:25pm: Clinical Child psychotherapist received notification from Well[employer] geologist to request hospital liaison, Hortencia Pilar send pt clinicals to [employer]. Clinical Social Worker contacted [employer] and notified and per Hortencia Pilar, she did receive pt clinicals and will be sending to [facility]. Clinical Social Worker to continue to follow.  Jacklynn Lewis, MSW, LCSWA  Clinical Social Work [phone]

## 2011-10-21 NOTE — Progress Notes (Signed)
Patient has been up in room several times today with no assistance. Patient observed carrying food tray across room without difficulty. Patient has also refused bed alarm today. Along with refusing bed alarm patient has refused her Lipitor, insulin, and mouthwash each day I have had this patient. Madelin Rear RN, CMSRN

## 2011-10-21 NOTE — Progress Notes (Addendum)
Clinical Social Worker and RNCM met with pt at bedside again to inform her that due [employer]'s decision of denial in regard to SNF placement and pt being medically stable for discharge and currently discharged that pt would need return home today. Clinical Social Worker and RNCM assessed ways to assist pt in returning home at discharge and discussed the services that she will be able to receive at home. Pt stated that pt daughter is contacting Scientist, research (physical sciences) and pt is opposed to being discharged today. RNCM informed pt that due to pt discharge orders, that if pt continues to refuse to vacate [facility] property then pt will be escorted out by security. Pt responded as quote, "that sounds like a threat and I am going to call [name]", pt proceeded to contact [name] Grant's office and speak to secretary who stated she will give message to [name] to contact pt in pt's room. RNCM informed medical director, Dr. Jacky Kindle of pt's response, per [facility] Dr. Jacky Kindle will discuss with [name]. At this time, Clinical Child psychotherapist provided pt with [employer] denial letter and informed pt that RN has pt cab voucher to assist with pt transportation home. Pt RN and unit charge nurse aware. No further social work needs identified at this time. Clinical Social Worker signing off.    Jacklynn Lewis, MSW, LCSWA  Clinical Social Work [phone]

## 2011-10-21 NOTE — Progress Notes (Signed)
Alexandra [name] [id number] Discharge Data: 10/21/2011 6:30 PM Attending Provider: Marinda Elk, MD JYN:WGNF,[id number] [name], MD     [name] to be D/C'd Home per MD order.  Discussed with the patient the After Visit Summary and all questions fully answered.  All belongings returned to patient for patient to take home. Patient is waiting on her ride per patient.   Last Vital Signs:  Blood pressure 173/52, pulse 70, temperature 98.3 F (36.8 C), temperature source Oral, resp. rate 18, height 5\' 5"  (1.651 m), weight 56 kg (123 lb 7.3 oz), last menstrual period 09/27/2011, SpO2 99.00%.  Discharge Medication List Medication List  As of 10/21/2011  6:30 PM   TAKE these medications         allopurinol 100 MG tablet   Commonly known as: ZYLOPRIM   Take 100 mg by mouth daily.      amLODipine 5 MG tablet   Commonly known as: NORVASC   Take 10 mg by mouth daily.      aspirin 81 MG chewable tablet   Chew 4 tablets (324 mg total) by mouth once.      atorvastatin 20 MG tablet   Commonly known as: LIPITOR   Place 1 tablet (20 mg total) into feeding tube daily at 6 PM.      calcium acetate 667 MG capsule   Commonly known as: PHOSLO   Take 667 mg by mouth 4 (four) times daily.      cloNIDine 0.1 mg/24hr patch   Commonly known as: CATAPRES - Dosed in mg/24 hr   Place 1 patch onto the skin once a week. Changes on friday      Coenzyme Q10 150 MG Caps   Take 300 mg by mouth daily. Hold while in hospital      docusate sodium 100 MG capsule   Commonly known as: COLACE   Take 200 mg by mouth at bedtime.      hydrALAZINE 25 MG tablet   Commonly known as: APRESOLINE   Take 50 mg by mouth 3 (three) times daily.      l-methylfolate-B6-B12 3-35-2 MG Tabs   Commonly known as: METANX   Take 1 tablet by mouth 2 (two) times daily.      losartan 50 MG tablet   Commonly known as: COZAAR   Take 100 mg by mouth at bedtime.      metoprolol succinate 50 MG 24 hr tablet   Commonly known as: TOPROL-XL   Take 100 mg by mouth 2 (two) times daily. Takes after dialysis (T, Th, Sat)      multivitamin Tabs tablet   Take 1 tablet by mouth daily.      paricalcitol 5 MCG/ML injection   Commonly known as: ZEMPLAR   Inject 0.2 mLs (1 mcg total) into the vein every Monday, Wednesday, and Friday with hemodialysis.      repaglinide 0.5 MG tablet   Commonly known as: PRANDIN   Take 0.5 mg by mouth 3 (three) times daily before meals.            Susann Givens, RN, [facility] 10/21/2011 6:30 PM

## 2011-10-21 NOTE — Progress Notes (Signed)
Occupational Therapy Treatment Patient Details Name: Alexandra [name] MRN: [id number] DOB: 05/04/1945 Today's Date: 10/21/2011 Time: 4098-1191 OT Time Calculation (min): 25 min  OT Assessment / Plan / Recommendation Comments on Treatment Session Pt is unsafe to return home without near 24hr assist. Note that her insurance will not cover SNF or CIR. Strongly encouraged pt to have family and/or friends to check on her as often as possible. Also, would recommend [facility] for as many hours as possible.    Follow Up Recommendations  Home health OT;Supervision/Assistance - 24 hour    Barriers to Discharge       Equipment Recommendations  Rolling walker with 5" wheels    Recommendations for Other Services    Frequency     Plan Discharge plan remains appropriate    Precautions / Restrictions Precautions Precautions: Fall Restrictions Weight Bearing Restrictions: No   Pertinent Vitals/Pain Pt reports chronic shoulder pain but did not rate. Repositioned pt    ADL  Grooming: Performed;Supervision/safety Where Assessed - Grooming: Unsupported standing Lower Body Bathing: Simulated;Min guard Where Assessed - Lower Body Bathing: Supported sit to stand Lower Body Dressing: Simulated;Min guard Where Assessed - Lower Body Dressing: Unsupported sitting Toilet Transfer: Performed;Min guard Statistician Method: Sit to Barista: Materials engineer and Hygiene: Performed;Min guard Where Assessed - Engineer, mining and Hygiene: Sit to stand from 3-in-1 or toilet Equipment Used: Rolling walker;Gait belt Transfers/Ambulation Related to ADLs: Pt refused to use RW for ambulation; Min guard A/supervision with ambulation throughout room ADL Comments: Pt unaware of stool on gown; pt had BM x 2 on 3n1 (which she originally refused to use) and allowed therapist to assist her in changing her gown.     OT Diagnosis:    OT Problem  List:   OT Treatment Interventions:     OT Goals ADL Goals ADL Goal: Grooming - Progress: Progressing toward goals ADL Goal: Lower Body Bathing - Progress: Progressing toward goals ADL Goal: Lower Body Dressing - Progress: Progressing toward goals ADL Goal: Toilet Transfer - Progress: Progressing toward goals ADL Goal: Toileting - Clothing Manipulation - Progress: Progressing toward goals ADL Goal: Toileting - Hygiene - Progress: Progressing toward goals  Visit Information  Last OT Received On: 10/21/11 Assistance Needed: +1    Subjective Data      Prior Functioning       Cognition  Overall Cognitive Status: Impaired Area of Impairment: Safety/judgement Arousal/Alertness: Awake/alert Orientation Level: Appears intact for tasks assessed Behavior During Session: [facility] for tasks performed Safety/Judgement: Other (comment) Safety/Judgement - Other Comments: Pt insists she will be safe to return home; however, she was unaware of stool on her gown and initially was refusing therapy stating she was just too tired.     Mobility Bed Mobility Bed Mobility: Supine to Sit;Sitting - Scoot to Edge of Bed;Sit to Supine Supine to Sit: 6: Modified independent (Device/Increase time);HOB elevated Sitting - Scoot to Edge of Bed: 6: Modified independent (Device/Increase time) Sit to Supine: 6: Modified independent (Device/Increase time);HOB elevated Details for Bed Mobility Assistance: pt moves slowly and with incr effort, yet uses safe technique Transfers Sit to Stand: 5: Supervision;From bed;From chair/3-in-1 Stand to Sit: 5: Supervision;With armrests;To chair/3-in-1;To bed Details for Transfer Assistance: Pt slow to stand, but did so without any LOB   Exercises Low Level/ICU Exercises Stabilized Bridging: AROM;Both;5 reps;Supine (knees apart, no use of UEs, 10 second hold)  Balance Balance Balance Assessed: Yes Static Standing Balance Static Standing - Balance  Support: No upper extremity  supported Static Standing - Level of Assistance: 5: Stand by assistance Static Standing - Comment/# of Minutes: Pt refused use of RW; no imbalance observed; stood x 30 seconds  End of Session OT - End of Session Equipment Utilized During Treatment:  (pt refused gait belt and RW) Activity Tolerance:  (treatment limited by pt agenda) Patient left: in bed;with call bell/phone within reach Nurse Communication:  (RN notified of pt's request to fix curtains)  GO     Oaklynn Stierwalt 10/21/2011, 2:02 PM

## 2011-10-21 NOTE — Progress Notes (Signed)
Physical Therapy Treatment Patient Details Name: Alexandra [name] MRN: [id number] DOB: Mar 09, 1946 Today's Date: 10/21/2011 Time: 0936-1000 PT Time Calculation (min): 24 min  PT Assessment / Plan / Recommendation Comments on Treatment Session  Pt easily gets off task and wants to discuss all the things she can and can't do vs demonstrate or practice them. Pt given a set amount of time that PT could work with her (20 minutes) and given updates as to how much time she had remaining and pt continued to refuse to get up and demonstrate her mobility needs/abilities. MD in near end of session to inform pt that her insurance refused to pay for inpatient rehab or SNF. Pt stood one time from the bed (supervision), pt then distracted by this news and refused further PT at this time.     Follow Up Recommendations  Home health PT;Supervision - Intermittent    Barriers to Discharge        Equipment Recommendations  Rolling walker with 5" wheels    Recommendations for Other Services    Frequency Min 3X/week   Plan Discharge plan needs to be updated;Frequency remains appropriate    Precautions / Restrictions Precautions Precautions: Fall Restrictions Weight Bearing Restrictions: No   Pertinent Vitals/Pain No pain indicated.    Mobility  Bed Mobility Bed Mobility: Supine to Sit;Sitting - Scoot to Edge of Bed;Sit to Supine Supine to Sit: 6: Modified independent (Device/Increase time);HOB elevated Sitting - Scoot to Edge of Bed: 6: Modified independent (Device/Increase time) Sit to Supine: 6: Modified independent (Device/Increase time);HOB elevated Details for Bed Mobility Assistance: pt moves slowly and with incr effort, yet uses safe technique Transfers Transfers: Sit to Stand Sit to Stand: 5: Supervision;With upper extremity assist;From bed Details for Transfer Assistance: Pt reported this was her biggest concern, yet only performed once after teaching her supine exercises. Easily gets off  task with story-telling. Ambulation/Gait Ambulation/Gait Assistance: Not tested (comment) (Pt given multiple cues that her time with PT was running out, and she continued to discuss issues vs perform them)    Exercises Low Level/ICU Exercises Stabilized Bridging: AROM;Both;5 reps;Supine (knees apart, no use of UEs, 10 second hold)   PT Diagnosis:    PT Problem List:   PT Treatment Interventions:     PT Goals Acute Rehab PT Goals Pt will go Supine/Side to Sit: with modified independence PT Goal: Supine/Side to Sit - Progress: Met Pt will go Sit to Supine/Side: with modified independence PT Goal: Sit to Supine/Side - Progress: Met Pt will go Sit to Stand: with modified independence PT Goal: Sit to Stand - Progress: Progressing toward goal Pt will Perform Home Exercise Program: Independently PT Goal: Perform Home Exercise Program - Progress: Progressing toward goal  Visit Information  Last PT Received On: 10/21/11 Assistance Needed: +1    Subjective Data  Subjective: "How can you send me out of here when I can't stand up from a chair?" Patient Stated Goal: be able to go sit to stand easier   Cognition  Overall Cognitive Status: Impaired Area of Impairment: Safety/judgement Arousal/Alertness: Awake/alert Orientation Level: Appears intact for tasks assessed Behavior During Session: [facility] for tasks performed Safety/Judgement: Other (comment) Safety/Judgement - Other Comments: Pt reporting she cannot get up from a chair by herself and does not feel safe to go home alone; then later during session states she can get up just fine and no longer needs the walker to help her "I can do it on my own."    Balance  Balance Balance Assessed: Yes Static Standing Balance Static Standing - Balance Support: No upper extremity supported Static Standing - Level of Assistance: 5: Stand by assistance Static Standing - Comment/# of Minutes: Pt refused use of RW; no imbalance observed; stood x 30  seconds  End of Session PT - End of Session Activity Tolerance: Patient tolerated treatment well Patient left: Other (comment) (standing with CM and SW in room)   GP     Sukhdeep Wieting 10/21/2011, 10:20 AM Pager [phone]

## 2011-10-21 NOTE — Progress Notes (Signed)
Patient's ride home came to pick her up. Patient wheeled downstairs in wheelchair to be d/c by nurse tech, Maisie Fus with belongings. Will continue to monitor. Nelda Marseille, RN

## 2011-10-21 NOTE — Progress Notes (Signed)
Patient Alexandra [name], 66 year old [place] American female is opposed to being discharged today.  She says she cannot stand on her own.  But patient seems to being preparing herself emotionally for "what feels like the inevitable."  Patient expressed appreciation for Chaplain's provision of pastoral presence and conversation.  No follow up needed.

## 2011-10-21 NOTE — Progress Notes (Addendum)
[facility] PROGRESS NOTE  Alexandra [name] WUJ:[id number] DOB: 1945-03-31 DOA: 10/11/2011 PCP: [name], MD  Assessment/Plan: A: Acute respiratory failure.  -Most likely due to acute pulmonary edema secondary to congestive heart failure, hypertensive emergency, fluid overload, in a patient with end-stage renal disease.-Non-Q-wave myocardial infarction also known as type II MI - she was seen by Dr. Sharyn Lull from cardiology and he did not recommend any further workup. ASA and beta blocker . -Patient was admitted and intubated on 7/29 and extubated on 8/1.  Chronic diastolic Heart Failure: -Echo reveals good EF but mild (gr 1) diastolic dysfunction.   ESRD on HD -HD per Renal now ongoing.  -home today, outpatient HD as previously plan before coming to the hospital.  Suspected HCAP / aspiration pneumonia.  -antibiotics: Vancomycin 7/29 >>>7/31 Zosyn 7/29 >>>8/1 , CTX 8/1 -PO levaquin- started on August 3-finished 8/6. -she mild temperature in the morning of 10/20/2011, she was tolerating her diet, and she had no further complaint, she had no leukocytosis and no diarrhea. She was monitored for 24 hours with no further fevers.   DM: -We did Resume Prandin once she was extubated  ETHICS: Patient underwent  psychiatric evaluation for capacity as required by [employer] rejected her. Will need to come to the hospital on as needed bases.  Code Status: full code Disposition Plan: Home Family communication-[place],Dr. Joni Reining Daughter [phone]    Brief narrative: 66 yo ESRD on HD admitted on 7/29 with dyspnea and transferred to ICU due to acute respiratory distress in the middle of the night. In ED: BiPAP started, given Vancomycin / Zosyn. Emergent HD upon admission. Troponin elevated. Pt was eventually intubated. Extubated on 8/1  Consultants:  Cardiology - Harwani  Nephrology: Bascom Levels   HPI/Subjective: No new  complaints  Objective: Filed Vitals:   10/20/11 1414 10/20/11 2143 10/21/11 0534 10/21/11 0949  BP: 149/59 152/68 163/71 159/69  Pulse: 78 73 70 70  Temp: 101.3 F (38.5 C) 98.5 F (36.9 C) 98.4 F (36.9 C)   TempSrc: Oral Oral Oral   Resp: 16 18 18    Height:      Weight:   56 kg (123 lb 7.3 oz)   SpO2: 98% 97% 99%     Intake/Output Summary (Last 24 hours) at 10/21/11 0958 Last data filed at 10/21/11 0900  Gross per 24 hour  Intake    200 ml  Output    650 ml  Net   -450 ml    Exam: General: Alert and oriented x3 Chest: clear to auscultation Heart: regular rate and rhythm Abdomen: soft nontender   Data Reviewed: Basic Metabolic Panel:  Lab 10/20/11 8413 10/18/11 0957 10/15/11 0707  NA 138 132* 138  K 4.0 3.6 3.6  CL 98 92* 97  CO2 27 25 26   GLUCOSE 99 170* 73  BUN 51* 68* 29*  CREATININE 7.39* 9.41* 5.33*  CALCIUM 10.2 9.9 9.0  MG -- -- 2.3  PHOS 2.9 2.7 5.2*   Liver Function Tests:  Lab 10/20/11 0807 10/18/11 0957 10/15/11 0707  AST -- -- --  ALT -- -- --  ALKPHOS -- -- --  BILITOT -- -- --  PROT -- -- --  ALBUMIN 3.2* 3.1* 3.3*   No results found for this basename: LIPASE:5,AMYLASE:5 in the last 168 hours No results found for this basename: AMMONIA:5 in the last 168 hours CBC:  Lab 10/20/11 0808 10/18/11 0957 10/15/11 0707  WBC 6.7 7.1 9.9  NEUTROABS 2.4 -- 6.3  HGB  8.0* 8.2* 8.0*  HCT 25.7* 25.5* 25.3*  MCV 74.3* 73.3* 74.6*  PLT 187 221 167   Cardiac Enzymes: No results found for this basename: CKTOTAL:5,CKMB:5,CKMBINDEX:5,TROPONINI:5 in the last 168 hours BNP (last 3 results)  Basename 10/15/11 0707  PROBNP 31669.0*   CBG:  Lab 10/21/11 0745 10/20/11 2120 10/20/11 1650 10/20/11 1302 10/20/11 0738  GLUCAP 69* 148* 151* 109* 99   Studies: Dg Chest 1 View  10/11/2011  *RADIOLOGY REPORT*  Clinical Data: Shortness of breath.  Status post dialysis.  CHEST - 1 VIEW  Comparison: Earlier today.  Findings: The pulmonary vasculature and  interstitial markings are less prominent.  The cardiac silhouette remains borderline enlarged.  Stable right jugular double-lumen catheter and thoracic spine degenerative changes.  IMPRESSION: Improving changes of congestive heart failure.  Original Report Authenticated By: Darrol Angel, M.D.   Ct Head Wo Contrast  10/14/2011  *RADIOLOGY REPORT*  Clinical Data: Altered mental status.  Rule out stroke  CT HEAD WITHOUT CONTRAST  Technique:  Contiguous axial images were obtained from the base of the skull through the vertex without contrast.  Comparison: None.  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 Chronic ischemia in the left frontal white matter is present.  No acute infarct.  Negative for hemorrhage or mass.  Image quality degraded by motion.  Multiple images were repeated due to motion.  IMPRESSION: Chronic left frontal infarct.  No acute abnormality.  Original Report Authenticated By: Camelia Phenes, M.D.   Dg Chest Port 1 View  10/15/2011  *RADIOLOGY REPORT*  Clinical Data: Extubated with renal insufficiency.  PORTABLE CHEST - 1 VIEW  Comparison: 1 day prior  Findings: Interval extubation.  Dialysis catheter unchanged, with tip at high right atrium.  A catheter projects over the upper chest and lower neck.  The nasogastric tube is no longer identified along the course of the esophagus.  Normal heart size.  Possible trace right pleural fluid. No pneumothorax.  Persistent mild pulmonary venous congestion and developing interstitial edema. No lobar consolidation.  IMPRESSION:  1.  Slight increase in the pulmonary venous congestion and developing interstitial edema. 2.  Extubation.  Presumed removal of nasogastric tube.  There is a catheter which projects over the upper chest and lower neck.  This is presumably external to the patient.  Original Report Authenticated By: Consuello Bossier, M.D.      . allopurinol  100 mg Oral Daily  . amLODipine  10 mg Oral Daily  . antiseptic oral  rinse  15 mL Mouth Rinse QID  . aspirin  324 mg Oral Once  . atorvastatin  20 mg Per Tube q1800  . calcium acetate  667 mg Oral QID  . chlorhexidine  15 mL Mouth/Throat BID  . cloNIDine  0.1 mg Transdermal Weekly  . diclofenac sodium   Topical QID  . docusate sodium  200 mg Oral QHS  . epoetin (EPOGEN/PROCRIT) injection  10,000 Units Intravenous Q M,W,F-HD  . heparin flush  20 Units Intracatheter Q12H  . heparin subcutaneous  5,000 Units Subcutaneous Q8H  . hydrALAZINE  50 mg Oral Q8H  . insulin aspart  0-15 Units Subcutaneous TID AC & HS  . l-methylfolate-B6-B12  1 tablet Oral BID  . losartan  100 mg Oral QHS  . metoprolol succinate  100 mg Oral BID  . multivitamin  1 tablet Oral Daily  . nystatin-triamcinolone   Topical BID  . paricalcitol  1 mcg Intravenous Q M,W,F-HD  . repaglinide  0.5  mg Oral TID AC    ________________________________________________________________________    Marinda Elk  [facility] Pager [phone] If 7PM-7AM, please contact night-coverage at [email or web address], password [email or web address] 10/21/2011, 9:58 AM  LOS: 10 days

## 2011-10-23 ENCOUNTER — Encounter (HOSPITAL_COMMUNITY): Payer: Self-pay | Admitting: *Deleted

## 2011-10-23 ENCOUNTER — Observation Stay (HOSPITAL_COMMUNITY): Payer: Medicare HMO

## 2011-10-23 ENCOUNTER — Other Ambulatory Visit (HOSPITAL_COMMUNITY): Payer: Self-pay | Admitting: Hematology

## 2011-10-23 ENCOUNTER — Observation Stay (HOSPITAL_COMMUNITY)
Admission: EM | Admit: 2011-10-23 | Discharge: 2011-10-23 | Disposition: A | Discharge status: Home / Self Care | Payer: Medicare HMO | Attending: Internal Medicine | Admitting: Internal Medicine

## 2011-10-23 DIAGNOSIS — I5033 Acute on chronic diastolic (congestive) heart failure: Secondary | ICD-10-CM | POA: Diagnosis present

## 2011-10-23 DIAGNOSIS — E119 Type 2 diabetes mellitus without complications: Secondary | ICD-10-CM | POA: Insufficient documentation

## 2011-10-23 DIAGNOSIS — I120 Hypertensive chronic kidney disease with stage 5 chronic kidney disease or end stage renal disease: Secondary | ICD-10-CM | POA: Insufficient documentation

## 2011-10-23 DIAGNOSIS — Z992 Dependence on renal dialysis: Secondary | ICD-10-CM | POA: Insufficient documentation

## 2011-10-23 DIAGNOSIS — I10 Essential (primary) hypertension: Secondary | ICD-10-CM | POA: Diagnosis present

## 2011-10-23 DIAGNOSIS — E785 Hyperlipidemia, unspecified: Secondary | ICD-10-CM | POA: Insufficient documentation

## 2011-10-23 DIAGNOSIS — N186 End stage renal disease: Secondary | ICD-10-CM | POA: Insufficient documentation

## 2011-10-23 LAB — RENAL FUNCTION PANEL
Albumin: 3.4 g/dL — ABNORMAL LOW (ref 3.5–5.2)
Chloride: 96 mEq/L (ref 96–112)
Creatinine, Ser: 8.62 mg/dL — ABNORMAL HIGH (ref 0.50–1.10)
GFR calc non Af Amer: 4 mL/min — ABNORMAL LOW (ref >90)
Phosphorus: 4.3 mg/dL (ref 2.3–4.6)
Potassium: 4 mEq/L (ref 3.5–5.1)

## 2011-10-23 LAB — CBC WITH DIFFERENTIAL/PLATELET
Basophils Absolute: 0.1 10*3/uL (ref 0.0–0.1)
Eosinophils Relative: 4 % (ref 0–5)
Monocytes Relative: 13 % — ABNORMAL HIGH (ref 3–12)
Neutrophils Relative %: 42 % — ABNORMAL LOW (ref 43–77)
Platelets: 250 10*3/uL (ref 150–400)
RBC: 3.65 MIL/uL — ABNORMAL LOW (ref 3.87–5.11)
RDW: 24.3 % — ABNORMAL HIGH (ref 11.5–15.5)
WBC: 6.6 10*3/uL (ref 4.0–10.5)

## 2011-10-23 MED ORDER — METOPROLOL SUCCINATE ER 100 MG PO TB24
100.0000 mg | ORAL_TABLET | Freq: Two times a day (BID) | ORAL | Status: DC
  Filled 2011-10-23 (×2): qty 1

## 2011-10-23 MED ORDER — EPOETIN ALFA 10000 UNIT/ML IJ SOLN
10000.0000 [IU] | Freq: Once | INTRAMUSCULAR | Status: AC
  Administered 2011-10-23: 10000 [IU] via INTRAVENOUS
  Filled 2011-10-23: qty 1

## 2011-10-23 MED ORDER — PARICALCITOL 5 MCG/ML IV SOLN
1.0000 ug | INTRAVENOUS | Status: DC
  Administered 2011-10-23: 1 ug via INTRAVENOUS

## 2011-10-23 MED ORDER — PARICALCITOL 5 MCG/ML IV SOLN
INTRAVENOUS | Status: AC
  Administered 2011-10-23: 1 ug via INTRAVENOUS
  Filled 2011-10-23: qty 1

## 2011-10-23 NOTE — ED Notes (Signed)
Pt here for routine dialysis, states unable to get in until midnight. Pt in no respiratory distress, pt alert and oriented.

## 2011-10-23 NOTE — ED Notes (Signed)
Patient waiting for dialysis.

## 2011-10-23 NOTE — ED Provider Notes (Signed)
History     CSN: [id number]  Arrival date & time 10/23/11  0002   First MD Initiated Contact with Patient 10/23/11 [phone]      Chief Complaint  Patient presents with  . needs dialysis     (Consider location/radiation/quality/duration/timing/severity/associated sxs/prior treatment) The history is provided by the patient.   patient is in the ER for her regular dialysis. She has end-stage renal disease and she cannot be dialyzed at dialysis centers due to social issues. She was recently intubated for fluid overload. Patient states that she was last dialyzed on Monday. Patient states she does not feel too overloaded right now. No edema. Mild dyspnea.  Past Medical History  Diagnosis Date  . Chronic kidney disease     esrd  . Diabetes mellitus   . Hyperlipidemia   . Hypertension   . Renal disorder   . Dialysis care     tues, thurs, sat  . 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 Carotid endarterectomy 2002    left  . Btl   . Rectal fissurectomy   . Av fistula placement 10/30/2010    right brachiocephalic   . Fistulogram     Family History  Problem Relation Age of Onset  . COPD Mother   . Kidney disease Mother   . Heart disease Father   . Lymphoma Son     History  Substance Use Topics  . Smoking status: Former Smoker -- 1.0 packs/day for 50 years    Types: Cigarettes  . Smokeless tobacco: Never Used  . Alcohol Use: No    OB History    Grav Para Term Preterm Abortions TAB SAB Ect Mult Living                  Review of Systems  Respiratory: Negative for shortness of breath.   Cardiovascular: Negative for chest pain and leg swelling.  Neurological: Negative for headaches.    Allergies  Codeine; Epinephrine; and Percocet  Home Medications   Current Outpatient Rx  Name Route Sig Dispense Refill  . ALLOPURINOL 100 MG PO TABS Oral Take 100 mg by mouth daily.      Marland Kitchen AMLODIPINE BESYLATE 5 MG PO TABS Oral Take 10 mg by mouth  daily.    . ASPIRIN 81 MG PO CHEW Oral Chew 4 tablets (324 mg total) by mouth once.    . ATORVASTATIN CALCIUM 20 MG PO TABS Per Tube Place 1 tablet (20 mg total) into feeding tube daily at 6 PM. 30 tablet 0  . CALCIUM ACETATE 667 MG PO CAPS Oral Take 667 mg by mouth 4 (four) times daily.     Marland Kitchen CLONIDINE HCL 0.1 MG/24HR TD PTWK Transdermal Place 1 patch onto the skin once a week. Changes on friday    . COENZYME Q10 150 MG PO CAPS Oral Take 300 mg by mouth daily. Hold while in hospital    . DOCUSATE SODIUM 100 MG PO CAPS Oral Take 200 mg by mouth at bedtime.      Marland Kitchen HYDRALAZINE HCL 25 MG PO TABS Oral Take 50 mg by mouth 3 (three) times daily.     . L-METHYLFOLATE-B6-B12 3-35-2 MG PO TABS Oral Take 1 tablet by mouth 2 (two) times daily.     Marland Kitchen LOSARTAN POTASSIUM 50 MG PO TABS Oral Take 100 mg by mouth at bedtime.     Marland Kitchen METOPROLOL SUCCINATE ER 50 MG PO TB24 Oral Take 100  mg by mouth 2 (two) times daily. Takes after dialysis (T, Th, Sat)    . RENA-VITE PO TABS Oral Take 1 tablet by mouth daily.      Marland Kitchen PARICALCITOL 5 MCG/ML IV SOLN Intravenous Inject 0.2 mLs (1 mcg total) into the vein every Monday, Wednesday, and Friday with hemodialysis. 1 mL   . REPAGLINIDE 0.5 MG PO TABS Oral Take 0.5 mg by mouth 3 (three) times daily before meals.       BP 187/62  Pulse 76  Temp 98 F (36.7 C) (Oral)  Resp 16  SpO2 99%  LMP 09/27/2011  Physical Exam  Constitutional: She is oriented to person, place, and time. She appears well-developed.  HENT:  Head: Normocephalic.  Eyes: Pupils are equal, round, and reactive to light.  Cardiovascular: Normal rate and regular rhythm.   Pulmonary/Chest: No respiratory distress.       Rhonchi to bilateral bases.  Abdominal: There is no tenderness.  Neurological: She is alert and oriented to person, place, and time.    ED Course  Procedures (including critical care time)  Labs Reviewed - No data to display No results found.   1. End stage renal failure on  dialysis       MDM  Patient presents for her regular dialysis. She does not appear to be severely fluid overloaded. She'll be admitted to [facility].        Juliet Rude. Rubin Payor, MD 10/23/11 1610

## 2011-10-23 NOTE — Progress Notes (Signed)
HEMODIALYSIS-SEE FLOWSHEET  Pt complaints of nausea/dizziness during last 20 minutes of treatment. UF off, given NS bolus and placed in [place]. Pt continuing to complain of not feeling well. BP 140s/50s. Request to be taken off machine early. Pt rinsed back and Dr. Bascom Levels notified. Total uf only 169cc d/t frequent saline bolus for symptomatic BP drops. Post rinseback BP 160/60.  Dr. Bascom Levels spoke directly to pt over phone before discharge. I discussed the importance of rechecking her BP at home if possible and discussing her BP meds with her primary MD d/t instability in HD. Pt seems to be dry. Pt states she feels "better" post HD and does not want to stay in hospital for observation. Pt Discharged through short stay to her ride at 0745 via wheelchair.

## 2011-10-23 NOTE — H&P (Signed)
[facility] History and Physical  Alexandra [name] [place] RUE:[id number] DOB: 04-07-1945    PCP:   [name], MD   Chief Complaint: Here for routine dialysis.  HPI: Alexandra [name] is an 66 y.o. female Alexandra [name] is an 66 y.o. female with past medical history of Chronic kidney disease; Diabetes mellitus; Hyperlipidemia; Hypertension; Renal disorder; Dialysis care; and Gout due to renal impairment, presents here for routine dialysis. Dialysis order is written by Dr. Leretha Dykes her nephrologist. She offered no other complaints today. Was recently in ICU for fluid overload, but is much better now.  Rewiew of Systems:  Constitutional: Negative for malaise, fever and chills. No significant weight loss or weight gain  Eyes: Negative for eye pain, redness and discharge, diplopia, visual changes, or flashes of light.  ENMT: Negative for ear pain, hoarseness, nasal congestion, sinus pressure and sore throat. No headaches; tinnitus, drooling, or problem swallowing.  Cardiovascular: Negative for chest pain, palpitations, diaphoresis, dyspnea and peripheral edema. ; No orthopnea, PND  Respiratory: Negative for cough, hemoptysis, wheezing and stridor. No pleuritic chestpain.  Gastrointestinal: Negative for nausea, vomiting, diarrhea, constipation, abdominal pain, melena, blood in stool, hematemesis, jaundice and rectal bleeding.  Genitourinary: Negative for frequency, dysuria, incontinence,flank pain and hematuria;  Musculoskeletal: Negative for back pain and neck pain. Negative for swelling and trauma.;  Skin: . Negative for pruritus, rash, abrasions, bruising and skin lesion.; ulcerations  Neuro: Negative for headache, lightheadedness and neck stiffness. Negative for weakness, altered level of consciousness , altered mental status, extremity weakness, burning feet, involuntary movement, seizure and syncope.  Psych: negative for anxiety, depression, insomnia, tearfulness, panic  attacks, hallucinations, paranoia, suicidal or homicidal ideation    Past Medical History  Diagnosis Date  . Chronic kidney disease     esrd  . Diabetes mellitus   . Hyperlipidemia   . Hypertension   . Renal disorder   . Dialysis care     tues, thurs, sat  . 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 Carotid endarterectomy 2002    left  . Btl   . Rectal fissurectomy   . Av fistula placement 10/30/2010    right brachiocephalic   . Fistulogram     Medications:  HOME MEDS: Prior to Admission medications   Medication Sig Start Date End Date Taking? Authorizing Provider  allopurinol (ZYLOPRIM) 100 MG tablet Take 100 mg by mouth daily.     Yes Historical Provider, MD  amLODipine (NORVASC) 5 MG tablet Take 10 mg by mouth daily. 07/13/11  Yes [name], MD  aspirin 81 MG chewable tablet Chew 4 tablets (324 mg total) by mouth once. 10/20/11 10/19/12 Yes Marinda Elk, MD  atorvastatin (LIPITOR) 20 MG tablet Place 1 tablet (20 mg total) into feeding tube daily at 6 PM. 10/20/11 10/19/12 Yes Marinda Elk, MD  calcium acetate (PHOSLO) 667 MG capsule Take 667 mg by mouth 4 (four) times daily.    Yes Historical Provider, MD  cloNIDine (CATAPRES - DOSED IN MG/24 HR) 0.1 mg/24hr patch Place 1 patch onto the skin once a week. Changes on friday   Yes Historical Provider, MD  Coenzyme Q10 150 MG CAPS Take 300 mg by mouth daily. Hold while in hospital   Yes Historical Provider, MD  docusate sodium (COLACE) 100 MG capsule Take 200 mg by mouth at bedtime.     Yes Historical Provider, MD  hydrALAZINE (APRESOLINE) 25 MG tablet Take 50 mg  by mouth 3 (three) times daily.  07/18/11  Yes [name], MD  l-methylfolate-B6-B12 (METANX) 3-35-2 MG TABS Take 1 tablet by mouth 2 (two) times daily.    Yes Historical Provider, MD  losartan (COZAAR) 50 MG tablet Take 100 mg by mouth at bedtime.    Yes Historical Provider, MD  metoprolol succinate (TOPROL-XL) 50 MG 24 hr  tablet Take 100 mg by mouth 2 (two) times daily. Takes after dialysis (T, Th, Sat)   Yes Historical Provider, MD  multivitamin (RENA-VIT) TABS tablet Take 1 tablet by mouth daily.     Yes Historical Provider, MD  paricalcitol (ZEMPLAR) 5 MCG/ML injection Inject 0.2 mLs (1 mcg total) into the vein every Monday, Wednesday, and Friday with hemodialysis. 10/20/11  Yes Marinda Elk, MD  repaglinide (PRANDIN) 0.5 MG tablet Take 0.5 mg by mouth 3 (three) times daily before meals.    Yes Historical Provider, MD     Allergies:  Allergies  Allergen Reactions  . Codeine Other (See Comments)    Passes out  . Epinephrine Other (See Comments)    Tachycardia, diaphoresis, syncope  . Percocet (Oxycodone-Acetaminophen) Other (See Comments)    Passes  out    Social History:   reports that she has quit smoking. Her smoking use included Cigarettes. She has a 50 pack-year smoking history. She has never used smokeless tobacco. She reports that she does not drink alcohol or use illicit drugs.  Family History: Family History  Problem Relation Age of Onset  . COPD Mother   . Kidney disease Mother   . Heart disease Father   . Lymphoma Son      Physical Exam: Filed Vitals:   10/23/11 0007 10/23/11 0102  BP: 159/64 187/62  Pulse: 77 76  Temp: 97.4 F (36.3 C) 98 F (36.7 C)  TempSrc: Oral Oral  Resp: 16 16  SpO2: 100% 99%   Blood pressure 187/62, pulse 76, temperature 98 F (36.7 C), temperature source Oral, resp. rate 16, last menstrual period 09/27/2011, SpO2 99.00%.   1. General: in No Acute distress  2. Psychological: Alert and Oriented  3. Head/ENT: Moist Mucous Membranes  Head Non traumatic, neck supple  4. SKIN: normal Skin turgor, Skin clean Dry and intact no rash  5. Heart: Regular rate and rhythm no Murmur, Rub or gallop  6. Lungs: Clear to auscultation bilaterally, no wheezes or crackles  7. Abdomen: Soft, non-tender, Non distended  8. Lower extremities: no clubbing,  cyanosis, mild edema  9. Neurologically Grossly intact, moving all 4 extremities equally  10. MSK: Normal range of motion  Labs on Admission:  Basic Metabolic Panel:  Lab 10/20/11 1610 10/18/11 0957  NA 138 132*  K 4.0 3.6  CL 98 92*  CO2 27 25  GLUCOSE 99 170*  BUN 51* 68*  CREATININE 7.39* 9.41*  CALCIUM 10.2 9.9  MG -- --  PHOS 2.9 2.7   Liver Function Tests:  Lab 10/20/11 0807 10/18/11 0957  AST -- --  ALT -- --  ALKPHOS -- --  BILITOT -- --  PROT -- --  ALBUMIN 3.2* 3.1*   No results found for this basename: LIPASE:5,AMYLASE:5 in the last 168 hours No results found for this basename: AMMONIA:5 in the last 168 hours CBC:  Lab 10/20/11 0808 10/18/11 0957  WBC 6.7 7.1  NEUTROABS 2.4 --  HGB 8.0* 8.2*  HCT 25.7* 25.5*  MCV 74.3* 73.3*  PLT 187 221   Cardiac Enzymes: No results found for this  basename: CKTOTAL:5,CKMB:5,CKMBINDEX:5,TROPONINI:5 in the last 168 hours  CBG:  Lab 10/21/11 1704 10/21/11 1215 10/21/11 0745 10/20/11 2120 10/20/11 1650  GLUCAP 123* 96 69* 148* 151*     Radiological Exams on Admission: No results found.   Assessment/Plan Present on Admission:  .ESRD (end stage renal disease) on dialysis .Diabetes mellitus .HTN (hypertension), benign .Chronic diastolic CHF (congestive heart failure) .Hyperlipidemia   PLAN:  Admit for ESRD needing routine dialysis.  Orders will be written by Dr Bascom Levels.  I have continue ALL her meds.  She is stable, FULL CODE, and be admitted to OBS under [facility].  Other plans as per orders.  Code Status: FULL Unk Lightning, MD. [facility] Pager [phone] 7pm to 7am.  10/23/2011, 3:11 AM

## 2011-10-25 ENCOUNTER — Encounter (HOSPITAL_COMMUNITY): Payer: Self-pay | Admitting: Adult Health

## 2011-10-25 ENCOUNTER — Observation Stay (HOSPITAL_BASED_OUTPATIENT_CLINIC_OR_DEPARTMENT_OTHER)
Admission: EM | Admit: 2011-10-25 | Discharge: 2011-10-26 | Disposition: A | Discharge status: Home / Self Care | Payer: Medicare HMO | Source: Home / Self Care | Attending: Emergency Medicine | Admitting: Emergency Medicine

## 2011-10-25 ENCOUNTER — Observation Stay (HOSPITAL_COMMUNITY): Payer: Medicare HMO

## 2011-10-25 DIAGNOSIS — Z992 Dependence on renal dialysis: Secondary | ICD-10-CM

## 2011-10-25 DIAGNOSIS — R0609 Other forms of dyspnea: Secondary | ICD-10-CM | POA: Insufficient documentation

## 2011-10-25 DIAGNOSIS — D638 Anemia in other chronic diseases classified elsewhere: Secondary | ICD-10-CM

## 2011-10-25 DIAGNOSIS — M109 Gout, unspecified: Secondary | ICD-10-CM | POA: Diagnosis present

## 2011-10-25 DIAGNOSIS — Z79899 Other long term (current) drug therapy: Secondary | ICD-10-CM

## 2011-10-25 DIAGNOSIS — I120 Hypertensive chronic kidney disease with stage 5 chronic kidney disease or end stage renal disease: Principal | ICD-10-CM | POA: Diagnosis present

## 2011-10-25 DIAGNOSIS — N186 End stage renal disease: Secondary | ICD-10-CM | POA: Diagnosis present

## 2011-10-25 DIAGNOSIS — Z87891 Personal history of nicotine dependence: Secondary | ICD-10-CM

## 2011-10-25 DIAGNOSIS — E785 Hyperlipidemia, unspecified: Secondary | ICD-10-CM | POA: Diagnosis present

## 2011-10-25 DIAGNOSIS — Z7982 Long term (current) use of aspirin: Secondary | ICD-10-CM

## 2011-10-25 DIAGNOSIS — R609 Edema, unspecified: Secondary | ICD-10-CM | POA: Insufficient documentation

## 2011-10-25 DIAGNOSIS — E119 Type 2 diabetes mellitus without complications: Secondary | ICD-10-CM | POA: Insufficient documentation

## 2011-10-25 DIAGNOSIS — R0989 Other specified symptoms and signs involving the circulatory and respiratory systems: Secondary | ICD-10-CM | POA: Insufficient documentation

## 2011-10-25 DIAGNOSIS — D649 Anemia, unspecified: Secondary | ICD-10-CM | POA: Diagnosis present

## 2011-10-25 DIAGNOSIS — I10 Essential (primary) hypertension: Secondary | ICD-10-CM

## 2011-10-25 LAB — RENAL FUNCTION PANEL
CO2: 29 mEq/L (ref 19–32)
Calcium: 9.6 mg/dL (ref 8.4–10.5)
Creatinine, Ser: 8.72 mg/dL — ABNORMAL HIGH (ref 0.50–1.10)
Glucose, Bld: 96 mg/dL (ref 70–99)
Phosphorus: 3.1 mg/dL (ref 2.3–4.6)

## 2011-10-25 LAB — CBC
Hemoglobin: 7.9 g/dL — ABNORMAL LOW (ref 12.0–15.0)
MCH: 23.2 pg — ABNORMAL LOW (ref 26.0–34.0)
MCHC: 31.1 g/dL (ref 30.0–36.0)
MCV: 74.5 fL — ABNORMAL LOW (ref 78.0–100.0)
RBC: 3.41 MIL/uL — ABNORMAL LOW (ref 3.87–5.11)

## 2011-10-25 MED ORDER — AMLODIPINE BESYLATE 10 MG PO TABS
10.0000 mg | ORAL_TABLET | Freq: Every day | ORAL | Status: AC
  Administered 2011-10-25: 10 mg via ORAL
  Filled 2011-10-25 (×2): qty 1

## 2011-10-25 MED ORDER — PARICALCITOL 5 MCG/ML IV SOLN
1.0000 ug | Freq: Once | INTRAVENOUS | Status: AC
  Administered 2011-10-26: 1 ug via INTRAVENOUS

## 2011-10-25 MED ORDER — HEPARIN SODIUM (PORCINE) 1000 UNIT/ML DIALYSIS
20.0000 [IU]/kg | INTRAMUSCULAR | Status: DC | PRN
  Administered 2011-10-25: 1100 [IU] via INTRAVENOUS_CENTRAL

## 2011-10-25 MED ORDER — EPOETIN ALFA 10000 UNIT/ML IJ SOLN: 10000.0000 [IU] | INTRAMUSCULAR | Status: DC

## 2011-10-25 MED ORDER — EPOETIN ALFA 10000 UNIT/ML IJ SOLN
10000.0000 [IU] | INTRAMUSCULAR | Status: DC
  Administered 2011-10-26: 10000 [IU] via INTRAVENOUS
  Filled 2011-10-25: qty 1

## 2011-10-25 NOTE — ED Notes (Signed)
Preparing to go to HD, admission orders received. No changes. Pt sleeping in recliner. Easily arousable and updated.

## 2011-10-25 NOTE — Progress Notes (Addendum)
Pt arrived to HD without complaints. Hemodialysis initiated. See Flowsheet  CBC results: Hgb =7.9, Lab ran it x2 with different results. New specimen drawn and sent to lab to confirm. Resulted 7.8. Dr. Bascom Levels paged x2 and called on cell x2. Awaiting return call.   HD complete at 0202. No response from MD. Pt states she will follow up with him in am. Offered to have her stay overnight so she can be evaluated in am by her MD. Pt does not want to stay. Discussed hgb and epogen dosing with pt. Pt has no complaints upon rinseback. I will follow up with Dr. Bascom Levels shortly to discuss situation as well. I did advise her that she may need an extra treatment this week d/t inability to pull large volumes of fluid off of her but to discuss this with Dr. Bascom Levels as well since she refuses to stay overnight.   Pt discharged through ED at 0230 to ride via wheelchair.

## 2011-10-25 NOTE — H&P (Signed)
Alexandra [name] is an 66 y.o. female.   Chief Complaint: Need dialysis HPI: A 66 year old female well known to service with end-stage renal disease on hemodialysis at least 3 times a week. Patient is here today for her hemodialysis. She denied any chest pain shortness of breath. She was dialyzed 2 days ago. She has done well since then. She is only worried that the last time she was in the hospital she had too much fluid in her mouth and she had to be intubated. But since her discharge she has had a couple of hemodialysis without a problem. Her initial labs in the ED showed no hyperkalemia or any sign of severe acidosis.  Past Medical History  Diagnosis Date  . Chronic kidney disease     esrd  . Diabetes mellitus   . Hyperlipidemia   . Hypertension   . Renal disorder   . Dialysis care     tues, thurs, sat  . 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 Carotid endarterectomy 2002    left  . Btl   . Rectal fissurectomy   . Av fistula placement 10/30/2010    right brachiocephalic   . Fistulogram     Family History  Problem Relation Age of Onset  . COPD Mother   . Kidney disease Mother   . Heart disease Father   . Lymphoma Son    Social History:  reports that she has quit smoking. Her smoking use included Cigarettes. She has a 50 pack-year smoking history. She has never used smokeless tobacco. She reports that she does not drink alcohol or use illicit drugs.  Allergies:  Allergies  Allergen Reactions  . Codeine Other (See Comments)    Passes out  . Epinephrine Other (See Comments)    Tachycardia, diaphoresis, syncope  . Statins     Liver enzymes rise    . Percocet (Oxycodone-Acetaminophen) Other (See Comments)    Passes  out     (Not in a hospital admission)  No results found for this or any previous visit (from the past 48 hour(s)). No results found.  Review of Systems  HENT: Negative.   Eyes: Negative.   Respiratory: Positive  for shortness of breath.   Cardiovascular: Negative.   Gastrointestinal: Negative.   Genitourinary: Negative.   Musculoskeletal: Negative.   Skin: Negative.   Neurological: Positive for weakness.  Endo/Heme/Allergies: Negative.   Psychiatric/Behavioral: Negative.     Blood pressure 199/64, pulse 89, temperature 97.5 F (36.4 C), temperature source Oral, resp. rate 16, last menstrual period 09/27/2011, SpO2 99.00%. Physical Exam  Constitutional: She is oriented to person, place, and time. She appears well-developed and well-nourished.  HENT:  Head: Normocephalic and atraumatic.  Right Ear: External ear normal.  Left Ear: External ear normal.  Nose: Nose normal.  Mouth/Throat: Oropharynx is clear and moist.  Eyes: Conjunctivae and EOM are normal. Pupils are equal, round, and reactive to light.  Neck: Normal range of motion. Neck supple.  Cardiovascular: Normal rate, regular rhythm, normal heart sounds and intact distal pulses.   Respiratory: Effort normal. She has rales.  GI: Soft. Bowel sounds are normal.  Musculoskeletal: Normal range of motion.  Neurological: She is alert and oriented to person, place, and time. She has normal reflexes.  Skin: Skin is warm and dry.  Psychiatric: She has a normal mood and affect. Her behavior is normal. Judgment and thought content normal.     Assessment/Plan A  66 year old female here for hemodialysis.  Plan #1 end-stage renal disease: Patient will be admitted for observation. Nephrology has been consulted for hemodialysis. Once her hemodialysis finished she'll be discharged unless there are other issues.  #2 diabetes: Continue his sliding scale insulin and home therapy.  #3 hypertension: Blood pressure is slightly elevated however should this will be corrected with hemodialysis on home therapy.  #4 anemia of chronic disease: Secondary to renal failure. Treatment will be per nephrology.  Daylene Vandenbosch,LAWAL 10/25/2011, 9:39 PM

## 2011-10-25 NOTE — ED Provider Notes (Signed)
History     CSN: [id number]  Arrival date & time 10/25/11  1954   First MD Initiated Contact with Patient 10/25/11 2013      Chief Complaint  Patient presents with  . Vascular Access Problem    requesting Dialysis    (Consider location/radiation/quality/duration/timing/severity/associated sxs/prior treatment) HPI Alexandra [name] is a 66 y.o. female patient in the ER for her regular dialysis. She has end-stage renal disease and she cannot be dialyzed at dialysis centers due to social issues. She was recently intubated for fluid overload. Patient states that she was last dialyzed on 8/10. Patient states she does not feel too overloaded right now. Mild LE edema. Mild dyspnea.  Past Medical History  Diagnosis Date  . Chronic kidney disease     esrd  . Diabetes mellitus   . Hyperlipidemia   . Hypertension   . Renal disorder   . Dialysis care     tues, thurs, sat  . 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 Carotid endarterectomy 2002    left  . Btl   . Rectal fissurectomy   . Av fistula placement 10/30/2010    right brachiocephalic   . Fistulogram     Family History  Problem Relation Age of Onset  . COPD Mother   . Kidney disease Mother   . Heart disease Father   . Lymphoma Son     History  Substance Use Topics  . Smoking status: Former Smoker -- 1.0 packs/day for 50 years    Types: Cigarettes  . Smokeless tobacco: Never Used  . Alcohol Use: No    OB History    Grav Para Term Preterm Abortions TAB SAB Ect Mult Living                  Review of Systems  Constitutional: Negative for fever and chills.  Respiratory: Positive for shortness of breath (mild). Negative for chest tightness.   Cardiovascular: Negative for chest pain and palpitations.  Gastrointestinal: Negative for nausea, vomiting and abdominal pain.  Musculoskeletal: Negative for myalgias.  Skin: Negative for rash.  Neurological: Negative for weakness.     Allergies  Codeine; Epinephrine; Statins; and Percocet  Home Medications   Current Outpatient Rx  Name Route Sig Dispense Refill  . ALLOPURINOL 100 MG PO TABS Oral Take 100 mg by mouth daily.      Marland Kitchen AMLODIPINE BESYLATE 5 MG PO TABS Oral Take 10 mg by mouth daily.    . ASPIRIN 81 MG PO CHEW Oral Chew 4 tablets (324 mg total) by mouth once.    Marland Kitchen CALCIUM ACETATE 667 MG PO CAPS Oral Take 667 mg by mouth 4 (four) times daily.     Marland Kitchen COENZYME Q10 150 MG PO CAPS Oral Take 300 mg by mouth daily. Hold while in hospital    . DOCUSATE SODIUM 100 MG PO CAPS Oral Take 200 mg by mouth at bedtime.      Marland Kitchen HYDRALAZINE HCL 25 MG PO TABS Oral Take 50 mg by mouth 3 (three) times daily.     . L-METHYLFOLATE-B6-B12 3-35-2 MG PO TABS Oral Take 1 tablet by mouth 2 (two) times daily.     Marland Kitchen LOSARTAN POTASSIUM 50 MG PO TABS Oral Take 100 mg by mouth at bedtime.     Marland Kitchen METOPROLOL SUCCINATE ER 50 MG PO TB24 Oral Take 100 mg by mouth 2 (two) times daily. Takes after dialysis (T, Th, Sat)    .  RENA-VITE PO TABS Oral Take 1 tablet by mouth daily.      Marland Kitchen PARICALCITOL 5 MCG/ML IV SOLN Intravenous Inject 0.2 mLs (1 mcg total) into the vein every Monday, Wednesday, and Friday with hemodialysis. 1 mL   . REPAGLINIDE 0.5 MG PO TABS Oral Take 0.5 mg by mouth 3 (three) times daily before meals.       BP 199/64  Pulse 89  Temp 97.5 F (36.4 C) (Oral)  Resp 16  SpO2 99%  LMP 09/27/2011  Physical Exam  Nursing note and vitals reviewed. Constitutional: She appears well-developed and well-nourished. No distress.  HENT:  Head: Normocephalic and atraumatic.  Eyes:       Normal appearance  Neck: Normal range of motion.  Cardiovascular: Normal rate, regular rhythm and normal heart sounds.   Pulmonary/Chest: Effort normal and breath sounds normal. She exhibits no tenderness.  Abdominal: Soft. Bowel sounds are normal. There is no tenderness. There is no rebound and no guarding.  Musculoskeletal: Normal range of motion.        trace nonpitting edema to LEs  Neurological: She is alert.  Skin: Skin is warm and dry. She is not diaphoretic.  Psychiatric: She has a normal mood and affect.    ED Course  Procedures (including critical care time)  Labs Reviewed - No data to display No results found.   No diagnosis found. 1) esrd on dialysis   MDM  Dr. Bascom Levels at the bedside at the time of my exam. He's written dialysis orders. Pt does not appear severely overloaded. Will admit to [facility].       Grant Fontana, PA-C 10/25/11 2038

## 2011-10-25 NOTE — ED Notes (Signed)
Requesting Dialysis

## 2011-10-25 NOTE — Consult Note (Signed)
Pt. In ER some swelling in her hands and ankles. Dialysis orders written.

## 2011-10-25 NOTE — ED Notes (Signed)
Denies: needs unmet. Here for routine HD. Dr. Bascom Levels finished at [facility], HD orders on chart, EDPA finished at [facility]. Pending arrival of hospitalist.

## 2011-10-26 ENCOUNTER — Other Ambulatory Visit (HOSPITAL_COMMUNITY): Payer: Self-pay | Admitting: Hematology

## 2011-10-26 MED ORDER — PARICALCITOL 5 MCG/ML IV SOLN
INTRAVENOUS | Status: AC
  Administered 2011-10-26: 1 ug via INTRAVENOUS
  Filled 2011-10-26: qty 1

## 2011-10-26 NOTE — ED Provider Notes (Signed)
Medical screening examination/treatment/procedure(s) were performed by non-physician practitioner and as supervising physician I was immediately available for consultation/collaboration.  Valli Randol, MD 10/26/11 0130 

## 2011-10-27 ENCOUNTER — Encounter (HOSPITAL_COMMUNITY): Payer: Self-pay | Admitting: Emergency Medicine

## 2011-10-27 ENCOUNTER — Inpatient Hospital Stay (HOSPITAL_COMMUNITY): Payer: Medicare HMO

## 2011-10-27 ENCOUNTER — Inpatient Hospital Stay (HOSPITAL_COMMUNITY)
Admission: EM | Admit: 2011-10-27 | Discharge: 2011-10-28 | DRG: 682 | Disposition: A | Discharge status: Home / Self Care | Payer: Medicare HMO | Attending: Emergency Medicine | Admitting: Emergency Medicine

## 2011-10-27 DIAGNOSIS — E119 Type 2 diabetes mellitus without complications: Secondary | ICD-10-CM

## 2011-10-27 DIAGNOSIS — I5032 Chronic diastolic (congestive) heart failure: Secondary | ICD-10-CM

## 2011-10-27 DIAGNOSIS — D638 Anemia in other chronic diseases classified elsewhere: Secondary | ICD-10-CM

## 2011-10-27 DIAGNOSIS — N186 End stage renal disease: Secondary | ICD-10-CM

## 2011-10-27 DIAGNOSIS — I509 Heart failure, unspecified: Secondary | ICD-10-CM

## 2011-10-27 DIAGNOSIS — I10 Essential (primary) hypertension: Secondary | ICD-10-CM

## 2011-10-27 LAB — GLUCOSE, CAPILLARY
Glucose-Capillary: 151 mg/dL — ABNORMAL HIGH (ref 70–99)
Glucose-Capillary: 90 mg/dL (ref 70–99)

## 2011-10-27 LAB — RENAL FUNCTION PANEL
Albumin: 3.2 g/dL — ABNORMAL LOW (ref 3.5–5.2)
CO2: 31 mEq/L (ref 19–32)
Chloride: 100 mEq/L (ref 96–112)
GFR calc Af Amer: 7 mL/min — ABNORMAL LOW (ref >90)
GFR calc non Af Amer: 6 mL/min — ABNORMAL LOW (ref >90)
Potassium: 3.7 mEq/L (ref 3.5–5.1)
Sodium: 142 mEq/L (ref 135–145)

## 2011-10-27 LAB — CBC
MCH: 23.1 pg — ABNORMAL LOW (ref 26.0–34.0)
Platelets: 244 10*3/uL (ref 150–400)
RBC: 3.59 MIL/uL — ABNORMAL LOW (ref 3.87–5.11)
WBC: 5.8 10*3/uL (ref 4.0–10.5)

## 2011-10-27 MED ORDER — EPOETIN ALFA 20000 UNIT/ML IJ SOLN
20000.0000 [IU] | INTRAMUSCULAR | Status: AC
  Administered 2011-10-28: 20000 [IU] via INTRAVENOUS
  Filled 2011-10-27: qty 1

## 2011-10-27 MED ORDER — HEPARIN SODIUM (PORCINE) 1000 UNIT/ML DIALYSIS
20.0000 [IU]/kg | INTRAMUSCULAR | Status: DC | PRN
  Administered 2011-10-27: 1100 [IU] via INTRAVENOUS_CENTRAL
  Filled 2011-10-27: qty 2

## 2011-10-27 MED ORDER — PARICALCITOL 5 MCG/ML IV SOLN
1.0000 ug | INTRAVENOUS | Status: AC
  Administered 2011-10-28: 1 ug via INTRAVENOUS
  Filled 2011-10-27: qty 0.2

## 2011-10-27 NOTE — ED Provider Notes (Addendum)
History     CSN: [id number]  Arrival date & time 10/27/11  1754   First MD Initiated Contact with Patient 10/27/11 1925      Chief Complaint  Patient presents with  . Vascular Access Problem    (Consider location/radiation/quality/duration/timing/severity/associated sxs/prior treatment) HPI Comments: Pt reports no problems or changes to usual health.  She is here for her usual dialysis session for today.  No CP, HA, blurred vision.  She reports dialysis unit is expecting her, but she has not notified Dr. Bascom Levels that she was on her way here.    The history is provided by the patient and medical records.    Past Medical History  Diagnosis Date  . Chronic kidney disease     esrd  . Diabetes mellitus   . Hyperlipidemia   . Hypertension   . Renal disorder   . Dialysis care     tues, thurs, sat  . 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 Carotid endarterectomy 2002    left  . Btl   . Rectal fissurectomy   . Av fistula placement 10/30/2010    right brachiocephalic   . Fistulogram     Family History  Problem Relation Age of Onset  . COPD Mother   . Kidney disease Mother   . Heart disease Father   . Lymphoma Son     History  Substance Use Topics  . Smoking status: Former Smoker -- 1.0 packs/day for 50 years    Types: Cigarettes  . Smokeless tobacco: Never Used  . Alcohol Use: No    OB History    Grav Para Term Preterm Abortions TAB SAB Ect Mult Living                  Review of Systems  Constitutional: Negative for fever and chills.  Respiratory: Negative for chest tightness and shortness of breath.   Cardiovascular: Negative for chest pain.  Neurological: Negative for headaches.    Allergies  Codeine; Epinephrine; Statins; and Percocet  Home Medications   Current Outpatient Rx  Name Route Sig Dispense Refill  . ALLOPURINOL 100 MG PO TABS Oral Take 100 mg by mouth daily.      Marland Kitchen AMLODIPINE BESYLATE 5 MG PO TABS  Oral Take 10 mg by mouth daily.    . ASPIRIN 81 MG PO CHEW Oral Chew 4 tablets (324 mg total) by mouth once.    Marland Kitchen CALCIUM ACETATE 667 MG PO CAPS Oral Take 667 mg by mouth 4 (four) times daily.     Marland Kitchen COENZYME Q10 150 MG PO CAPS Oral Take 300 mg by mouth daily. Hold while in hospital    . DOCUSATE SODIUM 100 MG PO CAPS Oral Take 200 mg by mouth at bedtime.      Marland Kitchen HYDRALAZINE HCL 25 MG PO TABS Oral Take 50 mg by mouth 3 (three) times daily.     . L-METHYLFOLATE-B6-B12 3-35-2 MG PO TABS Oral Take 1 tablet by mouth 2 (two) times daily.     Marland Kitchen LOSARTAN POTASSIUM 50 MG PO TABS Oral Take 100 mg by mouth at bedtime.     Marland Kitchen METOPROLOL SUCCINATE ER 50 MG PO TB24 Oral Take 100 mg by mouth 2 (two) times daily. Takes after dialysis (T, Th, Sat)    . RENA-VITE PO TABS Oral Take 1 tablet by mouth daily.      Marland Kitchen PARICALCITOL 5 MCG/ML IV SOLN Intravenous Inject 0.2  mLs (1 mcg total) into the vein every Monday, Wednesday, and Friday with hemodialysis. 1 mL   . REPAGLINIDE 0.5 MG PO TABS Oral Take 0.5 mg by mouth 3 (three) times daily before meals.       BP 181/62  Pulse 79  Temp 97 F (36.1 C) (Oral)  Resp 18  SpO2 100%  LMP 09/27/2011  Physical Exam  Nursing note and vitals reviewed. Constitutional: She appears well-developed and well-nourished.  Non-toxic appearance. She does not have a sickly appearance.  HENT:  Head: Normocephalic and atraumatic.  Neck: Normal range of motion. Neck supple.  Pulmonary/Chest: She is in respiratory distress.  Abdominal: Soft.  Neurological: She is alert.  Skin: Skin is warm.    ED Course  Procedures (including critical care time)  Labs Reviewed  GLUCOSE, CAPILLARY - Abnormal; Notable for the following:    Glucose-Capillary 217 (*)     All other components within normal limits   No results found.   1. End stage renal disease on dialysis   2. Hypertension    RA sat is 100% and I interpret to be normal.  8:14 PM Pt has been seen by Dr. Bascom Levels and [facility],.  MDM  Pt is here expecting her usual dialysis.  Pt has h/o HTN and ESRD.  Will consult Dr. Bascom Levels and [facility] for admission.          Gavin Pound. Oletta Lamas, MD 10/27/11 1933  Gavin Pound. Oletta Lamas, MD 10/27/11 2015

## 2011-10-27 NOTE — ED Notes (Signed)
Pt here for dialysis. No complaints.  

## 2011-10-27 NOTE — ED Notes (Signed)
[facility] at bedside for pt evaluation, pt blood sugar recheck per pt request. Results was 151, plan of care is updated with verbal understanding. Will continue to monitor pt, pt is awaiting to be dialyzed.

## 2011-10-27 NOTE — Progress Notes (Signed)
Order for pt to be T & C for 1UPRBC. Pt refuses to have this done until she knows what her HBG if. Dr. Marya Landry aware.

## 2011-10-27 NOTE — Progress Notes (Signed)
Pts HBG 8.3. Pt refuses to receive blood. Dr. Bascom Levels aware

## 2011-10-27 NOTE — H&P (Signed)
[facility]                                                                                    Patient Demographics  Alexandra [name], is a 66 y.o. female  CSN: [id number]  MRN: [id number]  DOB - 1945/05/22  Admit Date - 10/27/2011  Outpatient Primary MD for the patient is [name], MD   With History of -  Past Medical History  Diagnosis Date  . Chronic kidney disease     esrd  . Diabetes mellitus   . Hyperlipidemia   . Hypertension   . Renal disorder   . Dialysis care     tues, thurs, sat  . Gout due to renal impairment       Past Surgical History  Procedure Date  . Carotid endarterectomy 2002    left  . Btl   . Rectal fissurectomy   . Av fistula placement 10/30/2010    right brachiocephalic   . Fistulogram     in for   Chief Complaint  Patient presents with  . Vascular Access Problem     HPI  Alexandra [name]  is a 66 y.o. female, with past medical history significant for  chronic kidney disease on hemodialysis, presenting today for hemodialysis. She talked to [name] who accepted her and saw her in the emergency room, and asked that she be admitted to 2 or hospital and he will be on consult. Patient had hyperglycemia earlier, she denies chest pains shortness of breath nausea vomiting diarrhea. Patient also denies fevers chills headache dizziness cough palpitations   Review of Systems    In addition to the HPI above, No Fever-chills, No Headache, No changes with Vision or hearing, No problems swallowing food or Liquids, No Chest pain, Cough or Shortness of Breath, No Abdominal pain, No Nausea or Vommitting, Bowel movements are regular, No Blood in stool or Urine, No dysuria, No new skin rashes or bruises, No new joints pains-aches,  No new weakness, tingling, numbness in any extremity, No recent weight gain or loss, No polyuria, polydypsia or polyphagia, No significant Mental Stressors.  A full 10 point Review  of Systems was done, except as stated above, all other Review of Systems were negative.   Social History History  Substance Use Topics  . Smoking status: Former Smoker -- 1.0 packs/day for 50 years    Types: Cigarettes  . Smokeless tobacco: Never Used  . Alcohol Use: No     Family History Family History  Problem Relation Age of Onset  . COPD Mother   . Kidney disease Mother   . Heart disease Father   . Lymphoma Son      Prior to Admission medications   Medication Sig Start Date End Date Taking? Authorizing Provider  allopurinol (ZYLOPRIM) 100 MG tablet Take 100 mg by mouth daily.     Yes Historical Provider, MD  amLODipine (NORVASC) 5 MG tablet Take 10 mg by mouth daily. 07/13/11  Yes [name], MD  aspirin 81 MG chewable tablet Chew 4 tablets (324 mg total) by mouth once. 10/20/11 10/19/12 Yes Marinda Elk, MD  calcium acetate (  PHOSLO) 667 MG capsule Take 667 mg by mouth 4 (four) times daily.    Yes Historical Provider, MD  Coenzyme Q10 150 MG CAPS Take 300 mg by mouth daily. Hold while in hospital   Yes Historical Provider, MD  docusate sodium (COLACE) 100 MG capsule Take 200 mg by mouth at bedtime.     Yes Historical Provider, MD  hydrALAZINE (APRESOLINE) 25 MG tablet Take 50 mg by mouth 3 (three) times daily.  07/18/11  Yes [name], MD  l-methylfolate-B6-B12 (METANX) 3-35-2 MG TABS Take 1 tablet by mouth 2 (two) times daily.    Yes Historical Provider, MD  losartan (COZAAR) 50 MG tablet Take 100 mg by mouth at bedtime.    Yes Historical Provider, MD  metoprolol succinate (TOPROL-XL) 50 MG 24 hr tablet Take 100 mg by mouth 2 (two) times daily. Takes after dialysis (T, Th, Sat)   Yes Historical Provider, MD  multivitamin (RENA-VIT) TABS tablet Take 1 tablet by mouth daily.     Yes Historical Provider, MD  paricalcitol (ZEMPLAR) 5 MCG/ML injection Inject 0.2 mLs (1 mcg total) into the vein every Monday, Wednesday, and Friday with hemodialysis. 10/20/11  Yes Marinda Elk, MD  repaglinide (PRANDIN) 0.5 MG tablet Take 0.5 mg by mouth 3 (three) times daily before meals.    Yes Historical Provider, MD    Allergies  Allergen Reactions  . Codeine Other (See Comments)    Passes out  . Epinephrine Other (See Comments)    Tachycardia, diaphoresis, syncope  . Statins     Liver enzymes rise    . Percocet (Oxycodone-Acetaminophen) Other (See Comments)    Passes  out    Physical Exam  Vitals  Blood pressure 181/62, pulse 79, temperature 97 F (36.1 C), temperature source Oral, resp. rate 18, last menstrual period 09/27/2011, SpO2 100.00%.   1. General; elderly African American female in no acute distress  2. Normal affect and insight, Not Suicidal or Homicidal, Awake Alert, Oriented X 3.  3. No F.N deficits, ALL C.Nerves Intact, Strength 5/5 all 4 extremities, Sensation intact all 4 extremities, Plantars down going.  4. Ears and Eyes appear Normal, Conjunctivae clear, PERRLA. Moist Oral Mucosa.  5. Supple Neck, No JVD, No cervical lymphadenopathy appriciated, No Carotid Bruits.  6. Symmetrical Chest wall movement, Good air movement bilaterally, CTAB.  7. RRR, No Gallops, Rubs or Murmurs, No Parasternal Heave.  8. Positive Bowel Sounds, Abdomen Soft, Non tender, No organomegaly appriciated,No rebound -guarding or rigidity.  9.  No Cyanosis, Normal Skin Turgor, No Skin Rash or Bruise.  10. Good muscle tone,  joints appear normal , no effusions, Normal ROM.  11. No Palpable Lymph Nodes in Neck or Axillae    Data Review  CBC  Lab 10/25/11 2256 10/23/11 0309  WBC 6.3 6.6  HGB 7.9* 8.8*  HCT 25.4* 27.5*  PLT 245 250  MCV 74.5* 75.3*  MCH 23.2* 24.1*  MCHC 31.1 32.0  RDW 23.9* 24.3*  LYMPHSABS -- 2.6  MONOABS -- 0.9  EOSABS -- 0.3  BASOSABS -- 0.1  BANDABS -- --   ------------------------------------------------------------------------------------------------------------------  Chemistries   Lab 10/25/11 2240 10/23/11 0309    NA 141 137  K 4.7 4.0  CL 100 96  CO2 29 28  GLUCOSE 96 165*  BUN 67* 63*  CREATININE 8.72* 8.62*  CALCIUM 9.6 9.8  MG -- --  AST -- --  ALT -- --  ALKPHOS -- --  BILITOT -- --   ------------------------------------------------------------------------------------------------------------------ ---------    ---------------------------------------------------------------------------------------------------------------   ----------------------------------------------------------------------------------------------------------------  My personal review of EKG:  Personally reviewed Old Chart  Assessment & Plan   1. end stage renal disease for hemodialysis.  2. hyperglycemia patient will be placed on insulin sliding scale  3. anemia; patient had hemoglobin of 7.9 on 8/12 awaiting labs today. The patient will probably be transfused during hemodialysis if needed  4. Hypertension Uncontrolled Will place on clonidine when necessary. Continue medications at home  5. Generalized weakness.  Note; her labs are still pending we'll follow . Patient refused to have the labs drawn in the emergency room.   DVT Prophylaxis Heparin   AM Labs Ordered, also please review Full Orders  Family Communication: Admission, patients condition and plan of care including tests being ordered have been discussed with the patient who indicate understanding and agree with the plan and Code Status.  Code Status Full  Disposition Plan: Home with office followup  Time spent in minutes : 35 minutes  Condition fair

## 2011-10-27 NOTE — Consult Note (Signed)
Pt. In ER  Hg. Is low.if hg. Is low tonight will give .one unit of packed cells if hg. Is still low. If hg. Is 10.5  or lower give blood. Dialysis orders written.give stool cards.

## 2011-10-27 NOTE — ED Notes (Signed)
[name] at bedside for pt evaluation and dialysis orders.

## 2011-10-28 MED ORDER — PARICALCITOL 5 MCG/ML IV SOLN
INTRAVENOUS | Status: AC
  Administered 2011-10-28: 1 ug via INTRAVENOUS
  Filled 2011-10-28: qty 1

## 2011-10-28 NOTE — Progress Notes (Signed)
Pt transported to the ER via wheelchair accompanied by nurse and assisted in her transportation vehicle.

## 2011-10-29 ENCOUNTER — Encounter (HOSPITAL_COMMUNITY): Payer: Self-pay | Admitting: *Deleted

## 2011-10-29 ENCOUNTER — Observation Stay (HOSPITAL_COMMUNITY)
Admission: EM | Admit: 2011-10-29 | Discharge: 2011-10-30 | Disposition: A | Discharge status: Home / Self Care | Payer: Medicare HMO | Attending: Internal Medicine | Admitting: Internal Medicine

## 2011-10-29 DIAGNOSIS — Z992 Dependence on renal dialysis: Secondary | ICD-10-CM | POA: Diagnosis present

## 2011-10-29 DIAGNOSIS — N189 Chronic kidney disease, unspecified: Secondary | ICD-10-CM

## 2011-10-29 DIAGNOSIS — E1122 Type 2 diabetes mellitus with diabetic chronic kidney disease: Secondary | ICD-10-CM | POA: Diagnosis present

## 2011-10-29 DIAGNOSIS — I120 Hypertensive chronic kidney disease with stage 5 chronic kidney disease or end stage renal disease: Secondary | ICD-10-CM | POA: Insufficient documentation

## 2011-10-29 DIAGNOSIS — N186 End stage renal disease: Secondary | ICD-10-CM

## 2011-10-29 DIAGNOSIS — I10 Essential (primary) hypertension: Secondary | ICD-10-CM

## 2011-10-29 DIAGNOSIS — N039 Chronic nephritic syndrome with unspecified morphologic changes: Secondary | ICD-10-CM

## 2011-10-29 DIAGNOSIS — E119 Type 2 diabetes mellitus without complications: Secondary | ICD-10-CM

## 2011-10-29 DIAGNOSIS — D631 Anemia in chronic kidney disease: Secondary | ICD-10-CM | POA: Diagnosis present

## 2011-10-29 DIAGNOSIS — E785 Hyperlipidemia, unspecified: Secondary | ICD-10-CM | POA: Insufficient documentation

## 2011-10-29 LAB — CBC
Hemoglobin: 8 g/dL — ABNORMAL LOW (ref 12.0–15.0)
MCH: 23.5 pg — ABNORMAL LOW (ref 26.0–34.0)
MCHC: 31.1 g/dL (ref 30.0–36.0)
Platelets: 193 10*3/uL (ref 150–400)
RDW: 23.5 % — ABNORMAL HIGH (ref 11.5–15.5)

## 2011-10-29 LAB — BASIC METABOLIC PANEL
Calcium: 9.7 mg/dL (ref 8.4–10.5)
GFR calc Af Amer: 7 mL/min — ABNORMAL LOW (ref >90)
GFR calc non Af Amer: 6 mL/min — ABNORMAL LOW (ref >90)
Glucose, Bld: 157 mg/dL — ABNORMAL HIGH (ref 70–99)
Potassium: 4.1 mEq/L (ref 3.5–5.1)
Sodium: 143 mEq/L (ref 135–145)

## 2011-10-29 MED ORDER — HEPARIN SODIUM (PORCINE) 1000 UNIT/ML DIALYSIS
20.0000 [IU]/kg | INTRAMUSCULAR | Status: DC | PRN
  Administered 2011-10-29: 1100 [IU] via INTRAVENOUS_CENTRAL
  Filled 2011-10-29: qty 2

## 2011-10-29 MED ORDER — EPOETIN ALFA 20000 UNIT/ML IJ SOLN
20000.0000 [IU] | Freq: Once | INTRAMUSCULAR | Status: AC
  Administered 2011-10-30: 20000 [IU] via INTRAVENOUS
  Filled 2011-10-29: qty 1

## 2011-10-29 MED ORDER — PARICALCITOL 5 MCG/ML IV SOLN
INTRAVENOUS | Status: AC
  Administered 2011-10-29: 1 ug via INTRAVENOUS
  Filled 2011-10-29: qty 1

## 2011-10-29 MED ORDER — PARICALCITOL 5 MCG/ML IV SOLN
1.0000 ug | Freq: Once | INTRAVENOUS | Status: AC
  Administered 2011-10-29: 1 ug via INTRAVENOUS
  Filled 2011-10-29: qty 0.2

## 2011-10-29 NOTE — ED Provider Notes (Signed)
History     CSN: [id number]  Arrival date & time 10/29/11  1919   First MD Initiated Contact with Patient 10/29/11 2003      Chief Complaint  Patient presents with  . needs dialysis     (Consider location/radiation/quality/duration/timing/severity/associated sxs/prior treatment) HPI Comments: Pt with hx of ESRD on HD (t/t/s) comes in with cc of dialysis. Pt's last session was on Thursday. Pt has no chest pain, sob, palpitation, n/v/f/c.  The history is provided by the patient.    Past Medical History  Diagnosis Date  . Chronic kidney disease     esrd  . Diabetes mellitus   . Hyperlipidemia   . Hypertension   . Renal disorder   . Dialysis care     tues, thurs, sat  . 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 Carotid endarterectomy 2002    left  . Btl   . Rectal fissurectomy   . Av fistula placement 10/30/2010    right brachiocephalic   . Fistulogram     Family History  Problem Relation Age of Onset  . COPD Mother   . Kidney disease Mother   . Heart disease Father   . Lymphoma Son     History  Substance Use Topics  . Smoking status: Former Smoker -- 1.0 packs/day for 50 years    Types: Cigarettes  . Smokeless tobacco: Never Used  . Alcohol Use: No    OB History    Grav Para Term Preterm Abortions TAB SAB Ect Mult Living                  Review of Systems  Constitutional: Negative for activity change.  Respiratory: Negative for shortness of breath.   Cardiovascular: Negative for chest pain.  Gastrointestinal: Negative for nausea, vomiting and abdominal pain.  Genitourinary: Negative for dysuria.  Neurological: Negative for headaches.    Allergies  Codeine; Epinephrine; Statins; and Percocet  Home Medications   Current Outpatient Rx  Name Route Sig Dispense Refill  . ALLOPURINOL 100 MG PO TABS Oral Take 100 mg by mouth daily.      Marland Kitchen AMLODIPINE BESYLATE 5 MG PO TABS Oral Take 10 mg by mouth daily.    .  ASPIRIN 81 MG PO CHEW Oral Chew 4 tablets (324 mg total) by mouth once.    Marland Kitchen CALCIUM ACETATE 667 MG PO CAPS Oral Take 667 mg by mouth 4 (four) times daily.     Marland Kitchen COENZYME Q10 150 MG PO CAPS Oral Take 300 mg by mouth daily. Hold while in hospital    . DOCUSATE SODIUM 100 MG PO CAPS Oral Take 200 mg by mouth at bedtime.      Marland Kitchen HYDRALAZINE HCL 25 MG PO TABS Oral Take 50 mg by mouth 3 (three) times daily.     . L-METHYLFOLATE-B6-B12 3-35-2 MG PO TABS Oral Take 1 tablet by mouth 2 (two) times daily.     Marland Kitchen LOSARTAN POTASSIUM 50 MG PO TABS Oral Take 100 mg by mouth at bedtime.     Marland Kitchen METOPROLOL SUCCINATE ER 50 MG PO TB24 Oral Take 100 mg by mouth 2 (two) times daily. Takes after dialysis (T, Th, Sat)    . RENA-VITE PO TABS Oral Take 1 tablet by mouth daily.      Marland Kitchen PARICALCITOL 5 MCG/ML IV SOLN Intravenous Inject 0.2 mLs (1 mcg total) into the vein every Monday, Wednesday, and Friday with hemodialysis. 1  mL   . REPAGLINIDE 0.5 MG PO TABS Oral Take 0.5 mg by mouth 3 (three) times daily before meals.       BP 164/70  Pulse 83  Temp 97.4 F (36.3 C) (Oral)  Resp 16  SpO2 99%  LMP 09/27/2011  Physical Exam  Constitutional: She is oriented to person, place, and time. She appears well-developed and well-nourished.  HENT:  Head: Normocephalic and atraumatic.  Eyes: EOM are normal. Pupils are equal, round, and reactive to light.  Neck: Neck supple.  Cardiovascular: Normal rate, regular rhythm and normal heart sounds.   No murmur heard. Pulmonary/Chest: Effort normal. No respiratory distress.  Abdominal: Soft. She exhibits no distension. There is no tenderness. There is no rebound and no guarding.  Musculoskeletal: She exhibits edema.  Neurological: She is alert and oriented to person, place, and time.  Skin: Skin is warm and dry.    ED Course  Procedures (including critical care time)   Labs Reviewed  CBC  BASIC METABOLIC PANEL   No results found.   1. ESRD (end stage renal disease) on  dialysis   2. Anemia of renal disease   3. Diabetes mellitus   4. HTN (hypertension), benign       MDM  Pt with hx of ESRD on HD comes in w missed dialysis. Vitals are stable, no evidence of pulmonary edema. Will admit for dialysis.        Derwood Kaplan, MD 10/29/11 2239

## 2011-10-29 NOTE — ED Notes (Signed)
Patient transferred to hemodialysis via wheelchair

## 2011-10-29 NOTE — H&P (Signed)
Alexandra [name] is an 66 y.o. female.   Patient seen and examined on October 29, 2011 at 9:30 PM. Nephrologist - Dr. Bascom Levels. Chief Complaint: Has come for dialysis. HPI: 66 year-old female with history of diabetes mellitus type 2 hypertension ESRD on hemodialysis has come for her regular dialysis. Patient denies any chest pain shortness of breath. Patient is not in any acute distress.  Past Medical History  Diagnosis Date  . Chronic kidney disease     esrd  . Diabetes mellitus   . Hyperlipidemia   . Hypertension   . Renal disorder   . Dialysis care     tues, thurs, sat  . 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 Carotid endarterectomy 2002    left  . Btl   . Rectal fissurectomy   . Av fistula placement 10/30/2010    right brachiocephalic   . Fistulogram     Family History  Problem Relation Age of Onset  . COPD Mother   . Kidney disease Mother   . Heart disease Father   . Lymphoma Son    Social History:  reports that she has quit smoking. Her smoking use included Cigarettes. She has a 50 pack-year smoking history. She has never used smokeless tobacco. She reports that she does not drink alcohol or use illicit drugs.  Allergies:  Allergies  Allergen Reactions  . Codeine Other (See Comments)    Passes out  . Epinephrine Other (See Comments)    Tachycardia, diaphoresis, syncope  . Statins     Liver enzymes rise    . Percocet (Oxycodone-Acetaminophen) Other (See Comments)    Passes  out     (Not in a hospital admission)  Results for orders placed during the hospital encounter of 10/27/11 (from the past 48 hour(s))  RENAL FUNCTION PANEL     Status: Abnormal   Collection Time   10/27/11 10:17 PM      Component Value Range Comment   Sodium 142  135 - 145 mEq/L    Potassium 3.7  3.5 - 5.1 mEq/L    Chloride 100  96 - 112 mEq/L    CO2 31  19 - 32 mEq/L    Glucose, Bld 80  70 - 99 mg/dL    BUN 46 (*) 6 - 23 mg/dL    Creatinine, Ser 9.60 (*) 0.50 - 1.10 mg/dL    Calcium 9.5  8.4 - 45.4 mg/dL    Phosphorus 2.9  2.3 - 4.6 mg/dL    Albumin 3.2 (*) 3.5 - 5.2 g/dL    GFR calc non Af Amer 6 (*) >90 mL/min    GFR calc Af Amer 7 (*) >90 mL/min   CBC     Status: Abnormal   Collection Time   10/27/11 10:17 PM      Component Value Range Comment   WBC 5.8  4.0 - 10.5 K/uL    RBC 3.59 (*) 3.87 - 5.11 MIL/uL    Hemoglobin 8.3 (*) 12.0 - 15.0 g/dL    HCT 09.8 (*) 11.9 - 46.0 %    MCV 75.2 (*) 78.0 - 100.0 fL    MCH 23.1 (*) 26.0 - 34.0 pg    MCHC 30.7  30.0 - 36.0 g/dL    RDW 14.7 (*) 82.9 - 15.5 %    Platelets 244  150 - 400 K/uL   GLUCOSE, CAPILLARY     Status: Normal   Collection Time  10/27/11 10:20 PM      Component Value Range Comment   Glucose-Capillary 90  70 - 99 mg/dL    Comment 1 Documented in Chart      Comment 2 Notify RN      No results found.  Review of Systems  Constitutional: Negative.   HENT: Negative.   Eyes: Negative.   Respiratory: Negative.   Cardiovascular: Negative.   Gastrointestinal: Negative.   Genitourinary: Negative.   Musculoskeletal: Negative.   Skin: Negative.   Neurological: Negative.   Endo/Heme/Allergies: Negative.   Psychiatric/Behavioral: Negative.     Blood pressure 164/63, pulse 87, temperature 97.4 F (36.3 C), temperature source Oral, resp. rate 18, last menstrual period 09/27/2011, SpO2 99.00%. Physical Exam  Constitutional: She is oriented to person, place, and time. She appears well-developed and well-nourished. No distress.  HENT:  Head: Normocephalic and atraumatic.  Right Ear: External ear normal.  Left Ear: External ear normal.  Nose: Nose normal.  Mouth/Throat: Oropharynx is clear and moist. No oropharyngeal exudate.  Eyes: Conjunctivae are normal. Pupils are equal, round, and reactive to light. Right eye exhibits no discharge. Left eye exhibits no discharge. No scleral icterus.  Neck: Normal range of motion. Neck supple.  Cardiovascular:  Normal rate and regular rhythm.   Respiratory: Effort normal and breath sounds normal. No respiratory distress. She has no wheezes. She has no rales.  GI: Soft. Bowel sounds are normal. She exhibits no distension. There is no tenderness. There is no rebound.  Musculoskeletal: Normal range of motion. She exhibits no edema and no tenderness.  Neurological: She is alert and oriented to person, place, and time.       Moves all extremities.  Skin: Skin is warm and dry. No rash noted. She is not diaphoretic. No erythema.  Psychiatric: Her behavior is normal.     Assessment/Plan #1. ESRD on hemodialysis - dialysis per nephrologist. #2. Diabetes mellitus2 - continue home medications with sliding-scale coverage. #3. Hypertension - continue home medications.   CODE STATUS - full code.  Eduard Clos 10/29/2011, 9:46 PM

## 2011-10-29 NOTE — Consult Note (Signed)
Pt. In  ER  .she  Feels improved. Dialysis orders  Written.

## 2011-10-29 NOTE — ED Notes (Signed)
Here for dialysis. Dialysis notified

## 2011-11-01 ENCOUNTER — Observation Stay (HOSPITAL_COMMUNITY)
Admission: EM | Admit: 2011-11-01 | Discharge: 2011-11-02 | Discharge status: Against Medical Advice | Payer: Medicare HMO | Attending: Emergency Medicine | Admitting: Emergency Medicine

## 2011-11-01 ENCOUNTER — Encounter: Payer: Self-pay | Admitting: Nephrology

## 2011-11-01 ENCOUNTER — Encounter (HOSPITAL_COMMUNITY): Payer: Self-pay | Admitting: Emergency Medicine

## 2011-11-01 DIAGNOSIS — E785 Hyperlipidemia, unspecified: Secondary | ICD-10-CM | POA: Insufficient documentation

## 2011-11-01 DIAGNOSIS — Z720 Tobacco use: Secondary | ICD-10-CM | POA: Diagnosis present

## 2011-11-01 DIAGNOSIS — I120 Hypertensive chronic kidney disease with stage 5 chronic kidney disease or end stage renal disease: Secondary | ICD-10-CM | POA: Insufficient documentation

## 2011-11-01 DIAGNOSIS — E119 Type 2 diabetes mellitus without complications: Secondary | ICD-10-CM | POA: Insufficient documentation

## 2011-11-01 DIAGNOSIS — N186 End stage renal disease: Secondary | ICD-10-CM | POA: Insufficient documentation

## 2011-11-01 DIAGNOSIS — I10 Essential (primary) hypertension: Secondary | ICD-10-CM

## 2011-11-01 DIAGNOSIS — Z992 Dependence on renal dialysis: Principal | ICD-10-CM | POA: Insufficient documentation

## 2011-11-01 MED ORDER — HEPARIN SODIUM (PORCINE) 1000 UNIT/ML DIALYSIS
1200.0000 [IU] | INTRAMUSCULAR | Status: DC | PRN
  Administered 2011-11-02: 1200 [IU] via INTRAVENOUS_CENTRAL
  Filled 2011-11-01: qty 2

## 2011-11-01 NOTE — ED Notes (Signed)
PT. SEEN AND EXAMINED BY DR. Cory Roughen AT TRIAGE.

## 2011-11-01 NOTE — H&P (Signed)
Alexandra [name] is an 66 y.o. female. Patient seen and examined on November 01, 2011 at 11:50 PM. Nephrologist - Dr. Bascom Levels.   Chief Complaint: Has come for dialysis. HPI: 66 year-old female has come for regular dialysis. Denies any chest pain or shortness of breath. Patient is sitting comfortably on the bedside. Is not in any acute distress.  Past Medical History  Diagnosis Date  . Chronic kidney disease     esrd  . Diabetes mellitus   . Hyperlipidemia   . Hypertension   . Renal disorder   . Dialysis care     tues, thurs, sat  . 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 Carotid endarterectomy 2002    left  . Btl   . Rectal fissurectomy   . Av fistula placement 10/30/2010    right brachiocephalic   . Fistulogram     Family History  Problem Relation Age of Onset  . COPD Mother   . Kidney disease Mother   . Heart disease Father   . Lymphoma Son    Social History:  reports that she has quit smoking. Her smoking use included Cigarettes. She has a 50 pack-year smoking history. She has never used smokeless tobacco. She reports that she does not drink alcohol or use illicit drugs.  Allergies:  Allergies  Allergen Reactions  . Codeine Other (See Comments)    Passes out  . Epinephrine Other (See Comments)    Tachycardia, diaphoresis, syncope  . Statins     Liver enzymes rise    . Percocet (Oxycodone-Acetaminophen) Other (See Comments)    Passes  out     (Not in a hospital admission)  No results found for this or any previous visit (from the past 48 hour(s)). No results found.  Review of Systems  Constitutional: Negative.   HENT: Negative.   Eyes: Negative.   Respiratory: Negative.   Cardiovascular: Negative.   Gastrointestinal: Negative.   Genitourinary: Negative.   Musculoskeletal: Negative.   Skin: Negative.   Neurological: Negative.   Endo/Heme/Allergies: Negative.   Psychiatric/Behavioral: Negative.     Blood  pressure 197/59, pulse 92, temperature 98.2 F (36.8 C), temperature source Oral, resp. rate 14, last menstrual period 09/27/2011, SpO2 96.00%. Physical Exam  Constitutional: She is oriented to person, place, and time. She appears well-developed and well-nourished. No distress.  HENT:  Head: Normocephalic and atraumatic.  Right Ear: External ear normal.  Left Ear: External ear normal.  Nose: Nose normal.  Mouth/Throat: Oropharynx is clear and moist. No oropharyngeal exudate.  Eyes: Conjunctivae are normal. Pupils are equal, round, and reactive to light. Right eye exhibits no discharge. Left eye exhibits no discharge. No scleral icterus.  Neck: Normal range of motion. Neck supple.  Cardiovascular: Normal rate and regular rhythm.   Respiratory: Effort normal and breath sounds normal. No respiratory distress. She has no wheezes. She has no rales.  GI: Soft. Bowel sounds are normal. She exhibits no distension. There is no tenderness. There is no rebound.  Musculoskeletal: Normal range of motion. She exhibits no edema and no tenderness.  Neurological: She is alert and oriented to person, place, and time.       Moves all extremities.  Skin: Skin is warm and dry. She is not diaphoretic.     Assessment/Plan #1. ESRD on hemodialysis - dialysis per nephrologist. #2. Diabetes mellitus type 2 - continue home medications with sliding scale coverage. #3. Hypertension - continue home  medications. #4. Tobacco abuse - strongly advised to quit smoking.  CODE STATUS - full code.  Eduard Clos. 11/01/2011, 11:58 PM

## 2011-11-01 NOTE — ED Provider Notes (Signed)
History   This chart was scribed for Charles B. Bernette Mayers, MD by Melba Coon. The patient was seen in room TR04C/TR04C and the patient's care was started at 11:30PM.    CSN: [id number]  Arrival date & time 11/01/11  2256   First MD Initiated Contact with Patient 11/01/11 2326      No chief complaint on file.   (Consider location/radiation/quality/duration/timing/severity/associated sxs/prior treatment) The history is provided by the patient. No language interpreter was used.   Alexandra [name] is a 66 y.o. female who presents to the Emergency Department for a routine hemodialysis.  Pt is also c/o of any symptoms today. Pt also stated that she was in the ICU for 5 days 2 weeks ago for lung problems; had to be intubated for fluid overload. NAD at exam. No HA, fever, neck pain, sore throat, rash, back pain, CP, SOB, abd pain, n/v/d, dysuria, or extremity pain, edema, weakness, numbness, or tingling. No other pertinent medical symptoms.  Past Medical History  Diagnosis Date  . Chronic kidney disease     esrd  . Diabetes mellitus   . Hyperlipidemia   . Hypertension   . Renal disorder   . Dialysis care     tues, thurs, sat  . 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 Carotid endarterectomy 2002    left  . Btl   . Rectal fissurectomy   . Av fistula placement 10/30/2010    right brachiocephalic   . Fistulogram     Family History  Problem Relation Age of Onset  . COPD Mother   . Kidney disease Mother   . Heart disease Father   . Lymphoma Son     History  Substance Use Topics  . Smoking status: Former Smoker -- 1.0 packs/day for 50 years    Types: Cigarettes  . Smokeless tobacco: Never Used  . Alcohol Use: No    OB History    Grav Para Term Preterm Abortions TAB SAB Ect Mult Living                  Review of Systems 10 Systems reviewed and all are negative for acute change except as noted in the HPI.   Allergies    Codeine; Epinephrine; Statins; and Percocet  Home Medications   Current Outpatient Rx  Name Route Sig Dispense Refill  . ALLOPURINOL 100 MG PO TABS Oral Take 100 mg by mouth daily.      Marland Kitchen AMLODIPINE BESYLATE 5 MG PO TABS Oral Take 10 mg by mouth daily.    . ASPIRIN 81 MG PO CHEW Oral Chew 4 tablets (324 mg total) by mouth once.    Marland Kitchen CALCIUM ACETATE 667 MG PO CAPS Oral Take 667 mg by mouth 4 (four) times daily.     Marland Kitchen COENZYME Q10 150 MG PO CAPS Oral Take 300 mg by mouth daily. Hold while in hospital    . DOCUSATE SODIUM 100 MG PO CAPS Oral Take 200 mg by mouth at bedtime.      Marland Kitchen HYDRALAZINE HCL 25 MG PO TABS Oral Take 50 mg by mouth 3 (three) times daily.     . L-METHYLFOLATE-B6-B12 3-35-2 MG PO TABS Oral Take 1 tablet by mouth 2 (two) times daily.     Marland Kitchen LOSARTAN POTASSIUM 50 MG PO TABS Oral Take 100 mg by mouth at bedtime.     Marland Kitchen METOPROLOL SUCCINATE ER 50 MG PO TB24 Oral Take 100  mg by mouth 2 (two) times daily. Takes after dialysis (T, Th, Sat)    . RENA-VITE PO TABS Oral Take 1 tablet by mouth daily.      Marland Kitchen PARICALCITOL 5 MCG/ML IV SOLN Intravenous Inject 0.2 mLs (1 mcg total) into the vein every Monday, Wednesday, and Friday with hemodialysis. 1 mL   . REPAGLINIDE 0.5 MG PO TABS Oral Take 0.5 mg by mouth 3 (three) times daily before meals.       BP 197/59  Pulse 92  Temp 98.2 F (36.8 C) (Oral)  Resp 14  SpO2 96%  LMP 09/27/2011  Physical Exam  Nursing note and vitals reviewed. Constitutional: She is oriented to person, place, and time. She appears well-developed and well-nourished.  HENT:  Head: Normocephalic and atraumatic.  Eyes: EOM are normal. Pupils are equal, round, and reactive to light.  Neck: Normal range of motion. Neck supple.  Cardiovascular: Normal rate, normal heart sounds and intact distal pulses.   Pulmonary/Chest: Effort normal and breath sounds normal.  Abdominal: Bowel sounds are normal. She exhibits no distension. There is no tenderness.   Musculoskeletal: Normal range of motion. She exhibits no edema and no tenderness.  Neurological: She is alert and oriented to person, place, and time. She has normal strength. No cranial nerve deficit or sensory deficit.  Skin: Skin is warm and dry. No rash noted.  Psychiatric: She has a normal mood and affect.    ED Course  Procedures (including critical care time)  DIAGNOSTIC STUDIES: Oxygen Saturation is 96% on room air, adequate by my interpretation.    COORDINATION OF CARE:  11:32PM - Pt will be admitted for her dialysis.   Labs Reviewed - No data to display No results found.   No diagnosis found.    MDM  Pt already seen by Dr. Bascom Levels. Dr. Toniann Fail to admit.   I personally performed the services described in the documentation, which were scribed in my presence. The recorded information has been reviewed and considered.         Charles B. Bernette Mayers, MD 11/01/11 1610

## 2011-11-01 NOTE — Consult Note (Signed)
Pt in ER still a little cough. Cannot take statins. Will raise liver enzymes. Dialysis orders written.

## 2011-11-01 NOTE — ED Notes (Signed)
PT . IS HERE FOR HIS ROUTINE HEMODIALYSIS.

## 2011-11-02 ENCOUNTER — Observation Stay (HOSPITAL_COMMUNITY): Payer: Medicare HMO

## 2011-11-02 LAB — RENAL FUNCTION PANEL
BUN: 75 mg/dL — ABNORMAL HIGH (ref 6–23)
CO2: 26 mEq/L (ref 19–32)
Chloride: 98 mEq/L (ref 96–112)
GFR calc Af Amer: 5 mL/min — ABNORMAL LOW (ref >90)
Glucose, Bld: 150 mg/dL — ABNORMAL HIGH (ref 70–99)
Potassium: 3.8 mEq/L (ref 3.5–5.1)
Sodium: 138 mEq/L (ref 135–145)

## 2011-11-02 LAB — CBC WITH DIFFERENTIAL/PLATELET
Hemoglobin: 7.9 g/dL — ABNORMAL LOW (ref 12.0–15.0)
Lymphocytes Relative: 39 % (ref 12–46)
Lymphs Abs: 2.4 10*3/uL (ref 0.7–4.0)
MCH: 23.4 pg — ABNORMAL LOW (ref 26.0–34.0)
Monocytes Relative: 11 % (ref 3–12)
Neutro Abs: 2.9 10*3/uL (ref 1.7–7.7)
Neutrophils Relative %: 46 % (ref 43–77)
Platelets: 231 10*3/uL (ref 150–400)
RBC: 3.38 MIL/uL — ABNORMAL LOW (ref 3.87–5.11)
WBC: 6.2 10*3/uL (ref 4.0–10.5)

## 2011-11-02 LAB — GLUCOSE, CAPILLARY: Glucose-Capillary: 242 mg/dL — ABNORMAL HIGH (ref 70–99)

## 2011-11-02 MED ORDER — PARICALCITOL 5 MCG/ML IV SOLN
INTRAVENOUS | Status: AC
  Filled 2011-11-02: qty 1

## 2011-11-02 MED ORDER — PARICALCITOL 5 MCG/ML IV SOLN
1.0000 ug | Freq: Once | INTRAVENOUS | Status: AC
  Administered 2011-11-02: 1 ug via INTRAVENOUS

## 2011-11-02 MED ORDER — EPOETIN ALFA 20000 UNIT/ML IJ SOLN
20000.0000 [IU] | Freq: Once | INTRAMUSCULAR | Status: AC
  Administered 2011-11-02: 20000 [IU] via INTRAVENOUS
  Filled 2011-11-02: qty 1

## 2011-11-02 NOTE — ED Notes (Addendum)
Pt requested CBG check again. Stated that she was going to take another dose of her Prandin. Advised pt not to but pt continues to insist that she will and "is going to take [my] meds." Admitting MD notified. Charge RN notified as well.   Dialysis phoned and is ready for patient. Report given to [facility] RN who was also made aware of pt's CBG readings and self-administration of home medications.

## 2011-11-02 NOTE — ED Notes (Signed)
Pt requested CBG check. Pt stated that she was going to self-administer her home medication, Prandin. Pt advised not to take her home meds and allow staff to administer. Pt refused and gave self medication.

## 2011-11-02 NOTE — ED Notes (Signed)
Patient demanding to know what the "what the hold up is "[for dialysis]. Contacted dialysis to find out eta on pt's treatment. Unit will not be able to take pt for at least 1 hr. Updated pt on delay and offered transport to inpt bed once accepted to wait comfortably until dialysis is ready. Pt refused and then demanded that her blood sugar be taken "at once." tech checked CBG per pt request. Charge RN at pt's bedside. Will continue to monitor

## 2011-11-03 ENCOUNTER — Observation Stay (HOSPITAL_COMMUNITY)
Admission: EM | Admit: 2011-11-03 | Discharge: 2011-11-04 | Disposition: A | Discharge status: Home / Self Care | Payer: Medicare HMO | Attending: Internal Medicine | Admitting: Internal Medicine

## 2011-11-03 ENCOUNTER — Encounter (HOSPITAL_COMMUNITY): Payer: Self-pay | Admitting: Emergency Medicine

## 2011-11-03 DIAGNOSIS — I120 Hypertensive chronic kidney disease with stage 5 chronic kidney disease or end stage renal disease: Secondary | ICD-10-CM | POA: Insufficient documentation

## 2011-11-03 DIAGNOSIS — E785 Hyperlipidemia, unspecified: Secondary | ICD-10-CM | POA: Insufficient documentation

## 2011-11-03 DIAGNOSIS — Z992 Dependence on renal dialysis: Principal | ICD-10-CM | POA: Insufficient documentation

## 2011-11-03 DIAGNOSIS — D638 Anemia in other chronic diseases classified elsewhere: Secondary | ICD-10-CM | POA: Insufficient documentation

## 2011-11-03 DIAGNOSIS — I10 Essential (primary) hypertension: Secondary | ICD-10-CM

## 2011-11-03 DIAGNOSIS — N186 End stage renal disease: Secondary | ICD-10-CM | POA: Insufficient documentation

## 2011-11-03 DIAGNOSIS — E119 Type 2 diabetes mellitus without complications: Secondary | ICD-10-CM | POA: Insufficient documentation

## 2011-11-03 NOTE — ED Notes (Signed)
PT. HERE FOR HER ROUTINE HEMODIALYSIS , NO OTHER COMPLAINTS. RESPIRATIONS UNLABORED.

## 2011-11-04 ENCOUNTER — Observation Stay (HOSPITAL_COMMUNITY): Payer: Medicare HMO

## 2011-11-04 ENCOUNTER — Encounter (HOSPITAL_COMMUNITY): Payer: Self-pay | Admitting: Internal Medicine

## 2011-11-04 DIAGNOSIS — N186 End stage renal disease: Secondary | ICD-10-CM

## 2011-11-04 DIAGNOSIS — I10 Essential (primary) hypertension: Secondary | ICD-10-CM

## 2011-11-04 DIAGNOSIS — E1129 Type 2 diabetes mellitus with other diabetic kidney complication: Secondary | ICD-10-CM

## 2011-11-04 DIAGNOSIS — N058 Unspecified nephritic syndrome with other morphologic changes: Secondary | ICD-10-CM

## 2011-11-04 LAB — RENAL FUNCTION PANEL
BUN: 48 mg/dL — ABNORMAL HIGH (ref 6–23)
CO2: 30 mEq/L (ref 19–32)
Chloride: 96 mEq/L (ref 96–112)
GFR calc Af Amer: 6 mL/min — ABNORMAL LOW (ref >90)
Glucose, Bld: 113 mg/dL — ABNORMAL HIGH (ref 70–99)
Potassium: 4.2 mEq/L (ref 3.5–5.1)

## 2011-11-04 LAB — CBC
HCT: 24.6 % — ABNORMAL LOW (ref 36.0–46.0)
Hemoglobin: 7.7 g/dL — ABNORMAL LOW (ref 12.0–15.0)
RBC: 3.3 MIL/uL — ABNORMAL LOW (ref 3.87–5.11)
WBC: 5.6 10*3/uL (ref 4.0–10.5)

## 2011-11-04 LAB — GLUCOSE, CAPILLARY: Glucose-Capillary: 118 mg/dL — ABNORMAL HIGH (ref 70–99)

## 2011-11-04 LAB — PREPARE RBC (CROSSMATCH)

## 2011-11-04 MED ORDER — EPOETIN ALFA 20000 UNIT/ML IJ SOLN
20000.0000 [IU] | Freq: Once | INTRAMUSCULAR | Status: AC
  Administered 2011-11-04: 20000 [IU] via SUBCUTANEOUS
  Filled 2011-11-04: qty 1

## 2011-11-04 MED ORDER — PARICALCITOL 5 MCG/ML IV SOLN
1.0000 ug | Freq: Once | INTRAVENOUS | Status: AC
  Administered 2011-11-04: 1 ug via INTRAVENOUS
  Filled 2011-11-04: qty 0.2

## 2011-11-04 MED ORDER — PARICALCITOL 5 MCG/ML IV SOLN
INTRAVENOUS | Status: AC
  Administered 2011-11-04: 1 ug via INTRAVENOUS
  Filled 2011-11-04: qty 1

## 2011-11-04 MED ORDER — HEPARIN SODIUM (PORCINE) 1000 UNIT/ML DIALYSIS
20.0000 [IU]/kg | INTRAMUSCULAR | Status: DC | PRN
  Administered 2011-11-04: 1200 [IU] via INTRAVENOUS_CENTRAL
  Filled 2011-11-04: qty 2

## 2011-11-04 NOTE — ED Provider Notes (Signed)
History     CSN: [id number]  Arrival date & time 11/03/11  2341   None     No chief complaint on file.   (Consider location/radiation/quality/duration/timing/severity/associated sxs/prior treatment) Patient is a 66 y.o. female presenting with weakness. The history is provided by the patient (the pt is here for dialysis). No language interpreter was used.  Weakness Primary symptoms do not include headaches or seizures. The symptoms began 12 to 24 hours ago. The symptoms are unchanged. The neurological symptoms are multifocal. Context: nothing.  Additional symptoms include weakness. Additional symptoms do not include hallucinations. Medical issues do not include seizures.    Past Medical History  Diagnosis Date  . Chronic kidney disease     esrd  . Diabetes mellitus   . Hyperlipidemia   . Hypertension   . Renal disorder   . Dialysis care     tues, thurs, sat  . 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 Carotid endarterectomy 2002    left  . Btl   . Rectal fissurectomy   . Av fistula placement 10/30/2010    right brachiocephalic   . Fistulogram     Family History  Problem Relation Age of Onset  . COPD Mother   . Kidney disease Mother   . Heart disease Father   . Lymphoma Son     History  Substance Use Topics  . Smoking status: Former Smoker -- 1.0 packs/day for 50 years    Types: Cigarettes  . Smokeless tobacco: Never Used  . Alcohol Use: No    OB History    Grav Para Term Preterm Abortions TAB SAB Ect Mult Living                  Review of Systems  Constitutional: Positive for fatigue.  HENT: Negative for congestion, sinus pressure and ear discharge.   Eyes: Negative for discharge.  Respiratory: Negative for cough.   Cardiovascular: Negative for chest pain.  Gastrointestinal: Negative for abdominal pain and diarrhea.  Genitourinary: Negative for frequency and hematuria.  Musculoskeletal: Negative for back pain.    Skin: Negative for rash.  Neurological: Positive for weakness. Negative for seizures and headaches.  Hematological: Negative.   Psychiatric/Behavioral: Negative for hallucinations.    Allergies  Codeine; Epinephrine; Statins; and Percocet  Home Medications   Current Outpatient Rx  Name Route Sig Dispense Refill  . ALLOPURINOL 100 MG PO TABS Oral Take 100 mg by mouth daily.      Marland Kitchen AMLODIPINE BESYLATE 5 MG PO TABS Oral Take 10 mg by mouth daily.    . ASPIRIN 81 MG PO CHEW Oral Chew 4 tablets (324 mg total) by mouth once.    Marland Kitchen CALCIUM ACETATE 667 MG PO CAPS Oral Take 667 mg by mouth 4 (four) times daily.     Marland Kitchen COENZYME Q10 150 MG PO CAPS Oral Take 300 mg by mouth daily. Hold while in hospital    . DOCUSATE SODIUM 100 MG PO CAPS Oral Take 200 mg by mouth at bedtime.      Marland Kitchen HYDRALAZINE HCL 25 MG PO TABS Oral Take 50 mg by mouth 3 (three) times daily.     . L-METHYLFOLATE-B6-B12 3-35-2 MG PO TABS Oral Take 1 tablet by mouth 2 (two) times daily.     Marland Kitchen LOSARTAN POTASSIUM 50 MG PO TABS Oral Take 100 mg by mouth at bedtime.     Marland Kitchen METOPROLOL SUCCINATE ER 50 MG PO  TB24 Oral Take 100 mg by mouth daily. Takes after dialysis (T, Th, Sat)    . RENA-VITE PO TABS Oral Take 1 tablet by mouth daily.      Marland Kitchen PARICALCITOL 5 MCG/ML IV SOLN Intravenous Inject 0.2 mLs (1 mcg total) into the vein every Monday, Wednesday, and Friday with hemodialysis. 1 mL   . REPAGLINIDE 0.5 MG PO TABS Oral Take 0.5 mg by mouth 3 (three) times daily before meals.       BP 177/53  Pulse 81  Temp 97.5 F (36.4 C) (Oral)  Resp 16  SpO2 98%  LMP 09/27/2011  Physical Exam  Constitutional: She is oriented to person, place, and time. She appears well-developed.  HENT:  Head: Normocephalic and atraumatic.  Eyes: Conjunctivae and EOM are normal. No scleral icterus.  Neck: Neck supple. No thyromegaly present.  Cardiovascular: Normal rate and regular rhythm.  Exam reveals no gallop and no friction rub.   No murmur  heard. Pulmonary/Chest: No stridor. She has no wheezes. She has no rales. She exhibits no tenderness.  Abdominal: She exhibits no distension. There is no tenderness. There is no rebound.  Musculoskeletal: Normal range of motion. She exhibits no edema.  Lymphadenopathy:    She has no cervical adenopathy.  Neurological: She is oriented to person, place, and time. Coordination normal.  Skin: No rash noted. No erythema.  Psychiatric: She has a normal mood and affect. Her behavior is normal.    ED Course  Procedures (including critical care time)  Labs Reviewed  GLUCOSE, CAPILLARY - Abnormal; Notable for the following:    Glucose-Capillary 129 (*)     All other components within normal limits   No results found.   1. Diabetes mellitus with end stage renal disease   2. ESRD (end stage renal disease) on dialysis   3. HTN (hypertension), benign       MDM          Benny Lennert, MD 11/04/11 [phone]

## 2011-11-04 NOTE — ED Notes (Signed)
Report received. Patient states she is ready to go to dialysis

## 2011-11-04 NOTE — H&P (Signed)
Alexandra [name] is an 66 y.o. female.   Patient was seen and examined on November 04, 2011 at 3:20 AM. Nephrologist - Dr. Bascom Levels. Chief Complaint: Has come for dialysis. HPI: 66 year-old female with known history of ESRD on hemodialysis, diabetes mellitus type 2, hypertension has come for her regular dialysis. Patient denies any chest pain or shortness of breath and is not in any acute distress.  Past Medical History  Diagnosis Date  . Chronic kidney disease     esrd  . Diabetes mellitus   . Hyperlipidemia   . Hypertension   . Renal disorder   . Dialysis care     tues, thurs, sat  . 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 Carotid endarterectomy 2002    left  . Btl   . Rectal fissurectomy   . Av fistula placement 10/30/2010    right brachiocephalic   . Fistulogram     Family History  Problem Relation Age of Onset  . COPD Mother   . Kidney disease Mother   . Heart disease Father   . Lymphoma Son    Social History:  reports that she has quit smoking. Her smoking use included Cigarettes. She has a 50 pack-year smoking history. She has never used smokeless tobacco. She reports that she does not drink alcohol or use illicit drugs.  Allergies:  Allergies  Allergen Reactions  . Codeine Other (See Comments)    Passes out  . Epinephrine Other (See Comments)    Tachycardia, diaphoresis, syncope  . Statins     Liver enzymes rise    . Percocet (Oxycodone-Acetaminophen) Other (See Comments)    Passes  out     (Not in a hospital admission)  Results for orders placed during the hospital encounter of 11/03/11 (from the past 48 hour(s))  GLUCOSE, CAPILLARY     Status: Abnormal   Collection Time   11/04/11  1:51 AM      Component Value Range Comment   Glucose-Capillary 129 (*) 70 - 99 mg/dL    Comment 1 Documented in Chart      Comment 2 Notify RN      No results found.  Review of Systems  Constitutional: Negative.   HENT:  Negative.   Eyes: Negative.   Respiratory: Negative.   Cardiovascular: Negative.   Gastrointestinal: Negative.   Genitourinary: Negative.   Musculoskeletal: Negative.   Skin: Negative.   Neurological: Negative.   Endo/Heme/Allergies: Negative.   Psychiatric/Behavioral: Negative.     Blood pressure 177/53, pulse 81, temperature 97.5 F (36.4 C), temperature source Oral, resp. rate 16, last menstrual period 09/27/2011, SpO2 98.00%. Physical Exam  Constitutional: She is oriented to person, place, and time. She appears well-developed and well-nourished. No distress.  HENT:  Head: Normocephalic and atraumatic.  Right Ear: External ear normal.  Left Ear: External ear normal.  Nose: Nose normal.  Mouth/Throat: Oropharynx is clear and moist. No oropharyngeal exudate.  Eyes: Conjunctivae are normal. Pupils are equal, round, and reactive to light. Right eye exhibits no discharge. Left eye exhibits no discharge. No scleral icterus.  Neck: Normal range of motion. Neck supple.  Cardiovascular: Normal rate and regular rhythm.   Respiratory: Effort normal and breath sounds normal. No respiratory distress. She has no wheezes. She has no rales.  GI: Soft. Bowel sounds are normal. She exhibits no distension. There is no tenderness. There is no rebound.  Musculoskeletal: Normal range of motion. She  exhibits no edema and no tenderness.  Neurological: She is alert and oriented to person, place, and time.  Skin: Skin is warm and dry. She is not diaphoretic.     Assessment/Plan #1. ESRD on hemodialysis - dialysis per nephrologist. #2. Diabetes mellitus type 2 - continue home medications. Sliding-scale coverage. #3. Hypertension - continue home medications. When necessary IV hydralazine for systolic blood pressure more than 160.  CODE STATUS - full code.  Quirino Kakos N. 11/04/2011, 3:28 AM

## 2011-11-04 NOTE — Progress Notes (Addendum)
Hemodialysis-See flowsheet.  Labs received, hgb results 7.7. Dr. Bascom Levels notified that pt states she does not want to get any blood products and wants to see if the Epogen dose increase works first. Discussed this with Dr. Bascom Levels.   1.5 hours later pt states she 'changed my mind" and now is requesting a transfusion. Dr. Bascom Levels notified again. Order to T&C and transfuse 1 unit of PRBCS. Pt has 1 hour 20 minutes remaining on HD. Explained to pt that she may have to extend her treatment to receive blood products d/t processing time.   During last hour pt falling in and out of sleep, occasionally crying out "help me". She continuously felt her bp was dropping. I informed her that it was stable but placed her in Trendelenburg. She then stated she could not breathe. I placed her on 2L [place]. After rinseback when woken up fully pt states that she "doesnt remember any of that" and that "I was asleep". Discussed this with Dr. Bascom Levels.

## 2011-11-04 NOTE — Consult Note (Signed)
  Pt in ER swollen rt hand.  Chest clear. Dialysis orders written.

## 2011-11-04 NOTE — Progress Notes (Signed)
Pt discharged via wheelchair through ED. Pt had no complaints post HD. Discharged to taxi. [facility] RN

## 2011-11-05 ENCOUNTER — Encounter (HOSPITAL_COMMUNITY): Payer: Self-pay | Admitting: Adult Health

## 2011-11-05 ENCOUNTER — Observation Stay (HOSPITAL_COMMUNITY)
Admission: EM | Admit: 2011-11-05 | Discharge: 2011-11-06 | DRG: 682 | Discharge status: Against Medical Advice | Payer: Medicare HMO | Attending: Internal Medicine | Admitting: Internal Medicine

## 2011-11-05 DIAGNOSIS — I120 Hypertensive chronic kidney disease with stage 5 chronic kidney disease or end stage renal disease: Secondary | ICD-10-CM | POA: Insufficient documentation

## 2011-11-05 DIAGNOSIS — E119 Type 2 diabetes mellitus without complications: Secondary | ICD-10-CM | POA: Insufficient documentation

## 2011-11-05 DIAGNOSIS — I10 Essential (primary) hypertension: Secondary | ICD-10-CM

## 2011-11-05 DIAGNOSIS — E1122 Type 2 diabetes mellitus with diabetic chronic kidney disease: Secondary | ICD-10-CM | POA: Diagnosis present

## 2011-11-05 DIAGNOSIS — N186 End stage renal disease: Secondary | ICD-10-CM | POA: Insufficient documentation

## 2011-11-05 DIAGNOSIS — D638 Anemia in other chronic diseases classified elsewhere: Secondary | ICD-10-CM | POA: Diagnosis present

## 2011-11-05 DIAGNOSIS — Z992 Dependence on renal dialysis: Principal | ICD-10-CM | POA: Insufficient documentation

## 2011-11-05 DIAGNOSIS — E785 Hyperlipidemia, unspecified: Secondary | ICD-10-CM | POA: Insufficient documentation

## 2011-11-05 LAB — TYPE AND SCREEN: ABO/RH(D): A NEG

## 2011-11-05 NOTE — ED Provider Notes (Signed)
History     CSN: [id number]  Arrival date & time 11/05/11  2305   None     Chief Complaint  Patient presents with  . Vascular Access Problem    needs dialysis    (Consider location/radiation/quality/duration/timing/severity/associated sxs/prior treatment) HPI  Alexandra [name] is a 66 y.o. female who presents to the Emergency Department for a routine hemodialysis. She has no complaints and says she feels well. Pt is also c/o of any symptoms today. NAD at exam. No HA, fever, neck pain, sore throat, rash, back pain, CP, SOB, abd pain, n/v/d, dysuria, or extremity pain, edema, weakness, numbness, or tingling. No other pertinent medical symptoms.   Past Medical History  Diagnosis Date  . Chronic kidney disease     esrd  . Diabetes mellitus   . Hyperlipidemia   . Hypertension   . Renal disorder   . Dialysis care     tues, thurs, sat  . 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 Carotid endarterectomy 2002    left  . Btl   . Rectal fissurectomy   . Av fistula placement 10/30/2010    right brachiocephalic   . Fistulogram     Family History  Problem Relation Age of Onset  . COPD Mother   . Kidney disease Mother   . Heart disease Father   . Lymphoma Son     History  Substance Use Topics  . Smoking status: Former Smoker -- 1.0 packs/day for 50 years    Types: Cigarettes  . Smokeless tobacco: Never Used  . Alcohol Use: No    OB History    Grav Para Term Preterm Abortions TAB SAB Ect Mult Living                  Review of Systems  Review of Systems  Constitutional: Positive for fatigue.  HENT: Negative for congestion, sinus pressure and ear discharge.  Eyes: Negative for discharge.  Respiratory: Negative for cough.  Cardiovascular: Negative for chest pain.  Gastrointestinal: Negative for abdominal pain and diarrhea.  Genitourinary: Negative for frequency and hematuria.  Musculoskeletal: Negative for back pain.  Skin:  Negative for rash.  Neurological: Positive for weakness. Negative for seizures and headaches.  Hematological: Negative.  Psychiatric/Behavioral: Negative for hallucinations.    Allergies  Codeine; Epinephrine; Statins; and Percocet  Home Medications   Current Outpatient Rx  Name Route Sig Dispense Refill  . ALLOPURINOL 100 MG PO TABS Oral Take 100 mg by mouth daily.      Marland Kitchen AMLODIPINE BESYLATE 5 MG PO TABS Oral Take 10 mg by mouth daily.    . ASPIRIN 81 MG PO CHEW Oral Chew 4 tablets (324 mg total) by mouth once.    Marland Kitchen CALCIUM ACETATE 667 MG PO CAPS Oral Take 667 mg by mouth 4 (four) times daily.     Marland Kitchen COENZYME Q10 150 MG PO CAPS Oral Take 300 mg by mouth daily. Hold while in hospital    . DOCUSATE SODIUM 100 MG PO CAPS Oral Take 200 mg by mouth at bedtime.      Marland Kitchen HYDRALAZINE HCL 25 MG PO TABS Oral Take 50 mg by mouth 3 (three) times daily.     . L-METHYLFOLATE-B6-B12 3-35-2 MG PO TABS Oral Take 1 tablet by mouth 2 (two) times daily.     Marland Kitchen LOSARTAN POTASSIUM 50 MG PO TABS Oral Take 100 mg by mouth at bedtime.     Marland Kitchen  METOPROLOL SUCCINATE ER 50 MG PO TB24 Oral Take 100 mg by mouth daily. Takes after dialysis (T, Th, Sat)    . RENA-VITE PO TABS Oral Take 1 tablet by mouth daily.      Marland Kitchen PARICALCITOL 5 MCG/ML IV SOLN Intravenous Inject 0.2 mLs (1 mcg total) into the vein every Monday, Wednesday, and Friday with hemodialysis. 1 mL   . REPAGLINIDE 0.5 MG PO TABS Oral Take 0.5 mg by mouth 3 (three) times daily before meals.       BP 195/75  Pulse 101  Temp 97.5 F (36.4 C) (Oral)  Resp 18  SpO2 99%  LMP 09/27/2011  Physical Exam Physical Exam  Constitutional: She is oriented to person, place, and time. She appears well-developed.  HENT:  Head: Normocephalic and atraumatic.  Eyes: Conjunctivae and EOM are normal. No scleral icterus.  Neck: Neck supple. No thyromegaly present.  Cardiovascular: Normal rate and regular rhythm. Exam reveals no gallop and no friction rub.  No murmur heard.   Pulmonary/Chest: No stridor. She has no wheezes. She has no rales. She exhibits no tenderness.  Abdominal: She exhibits no distension. There is no tenderness. There is no rebound.  Musculoskeletal: Normal range of motion. She exhibits no edema.  Lymphadenopathy:  She has no cervical adenopathy.  Neurological: She is oriented to person, place, and time. Coordination normal.  Skin: No rash noted. No erythema.  Psychiatric: She has a normal mood and affect. Her behavior is normal.     ED Course  Procedures (including critical care time)  Labs Reviewed - No data to display No results found.   1. ESRD (end stage renal disease)   2. Hypertension       MDM  [facility] has agreed to admit for dialysis        Dorthula Matas, [place] 11/06/11 [phone]

## 2011-11-06 ENCOUNTER — Other Ambulatory Visit (HOSPITAL_COMMUNITY): Payer: Self-pay | Admitting: Nephrology

## 2011-11-06 ENCOUNTER — Encounter (HOSPITAL_COMMUNITY): Payer: Self-pay | Admitting: Internal Medicine

## 2011-11-06 ENCOUNTER — Inpatient Hospital Stay (HOSPITAL_COMMUNITY): Payer: Medicare HMO

## 2011-11-06 DIAGNOSIS — E1129 Type 2 diabetes mellitus with other diabetic kidney complication: Secondary | ICD-10-CM

## 2011-11-06 DIAGNOSIS — I10 Essential (primary) hypertension: Secondary | ICD-10-CM

## 2011-11-06 DIAGNOSIS — N186 End stage renal disease: Secondary | ICD-10-CM

## 2011-11-06 DIAGNOSIS — D638 Anemia in other chronic diseases classified elsewhere: Secondary | ICD-10-CM

## 2011-11-06 DIAGNOSIS — E119 Type 2 diabetes mellitus without complications: Secondary | ICD-10-CM

## 2011-11-06 DIAGNOSIS — N058 Unspecified nephritic syndrome with other morphologic changes: Secondary | ICD-10-CM

## 2011-11-06 DIAGNOSIS — M79601 Pain in right arm: Secondary | ICD-10-CM

## 2011-11-06 LAB — RENAL FUNCTION PANEL
BUN: 41 mg/dL — ABNORMAL HIGH (ref 6–23)
CO2: 30 mEq/L (ref 19–32)
Calcium: 9.8 mg/dL (ref 8.4–10.5)
Chloride: 98 mEq/L (ref 96–112)
Creatinine, Ser: 5.87 mg/dL — ABNORMAL HIGH (ref 0.50–1.10)
GFR calc non Af Amer: 7 mL/min — ABNORMAL LOW (ref >90)
Glucose, Bld: 99 mg/dL (ref 70–99)

## 2011-11-06 LAB — CBC WITH DIFFERENTIAL/PLATELET
Basophils Relative: 1 % (ref 0–1)
Eosinophils Relative: 3 % (ref 0–5)
HCT: 29.7 % — ABNORMAL LOW (ref 36.0–46.0)
Hemoglobin: 9.4 g/dL — ABNORMAL LOW (ref 12.0–15.0)
Lymphocytes Relative: 32 % (ref 12–46)
MCH: 24.4 pg — ABNORMAL LOW (ref 26.0–34.0)
Monocytes Absolute: 1.2 10*3/uL — ABNORMAL HIGH (ref 0.1–1.0)
Neutro Abs: 2.5 10*3/uL (ref 1.7–7.7)
Neutrophils Relative %: 44 % (ref 43–77)
RBC: 3.86 MIL/uL — ABNORMAL LOW (ref 3.87–5.11)

## 2011-11-06 LAB — GLUCOSE, CAPILLARY
Glucose-Capillary: 179 mg/dL — ABNORMAL HIGH (ref 70–99)
Glucose-Capillary: 153 mg/dL — ABNORMAL HIGH (ref 70–99)
Glucose-Capillary: 96 mg/dL (ref 70–99)

## 2011-11-06 MED ORDER — PARICALCITOL 5 MCG/ML IV SOLN
1.0000 ug | Freq: Once | INTRAVENOUS | Status: AC
  Administered 2011-11-06: 1 ug via INTRAVENOUS

## 2011-11-06 MED ORDER — HEPARIN SODIUM (PORCINE) 1000 UNIT/ML IJ SOLN: 2000.0000 [IU] | Freq: Once | INTRAMUSCULAR | Status: DC

## 2011-11-06 MED ORDER — EPOETIN ALFA 20000 UNIT/ML IJ SOLN
20000.0000 [IU] | Freq: Once | INTRAMUSCULAR | Status: AC
  Administered 2011-11-06: 20000 [IU] via SUBCUTANEOUS
  Filled 2011-11-06: qty 1

## 2011-11-06 MED ORDER — PARICALCITOL 5 MCG/ML IV SOLN
INTRAVENOUS | Status: AC
  Administered 2011-11-06: 1 ug via INTRAVENOUS
  Filled 2011-11-06: qty 1

## 2011-11-06 NOTE — ED Notes (Signed)
Hospitalist at bedside 

## 2011-11-06 NOTE — Discharge Summary (Addendum)
AGAINST MEDICAL ADVICE Physician Discharge Summary  Alexandra [name] HYQ:[id number] DOB: 04/21/1945 DOA: 11/05/2011  PCP: Alexandra Floro, MD  Admit date: 11/05/2011 Discharge date: 11/06/2011  Recommendations for Outpatient Follow-up:  1. Cannot be provided to the patient as [name].  Discharge Diagnoses:  Principal Problem:  *End stage renal disease Active Problems:  Anemia of chronic disease  Diabetes mellitus with end stage renal disease  Discharge Condition: Cannot be assessed as patient left prior to my examination.  Diet recommendation: Renal diet  Filed Weights   11/06/11 0315 11/06/11 0706  Weight: 58.2 kg (128 lb 4.9 oz) 57.9 kg (127 lb 10.3 oz)    History of present illness:  66 year old lady who presented on 11/06/2011 in need of hemodialysis to the emergency department.  Hospital Course:  Patient had hemodialysis on left prior to my examination of the patient.  Patient also has anemia due to chronic kidney disease, anemia panel was sent, results of anemia panel pending.  End stage renal disease, hemodialysis to be continued as per Dr. Marya Landry.  Procedures:  Hemodialysis on 11/06/2011.  Consultations:  Dr. Marya Landry, for hemodialysis.  Discharge Exam: Filed Vitals:   11/06/11 0706  BP: 175/73  Pulse: 92  Temp: 97.9 F (36.6 C)  Resp: 17   Filed Vitals:   11/06/11 0600 11/06/11 0630 11/06/11 0702 11/06/11 0706  BP: 175/72 188/76 164/67 175/73  Pulse: 96 97 96 92  Temp:    97.9 F (36.6 C)  TempSrc:      Resp: 20 18 17 17   Weight:    57.9 kg (127 lb 10.3 oz)  SpO2:    94%   Patient was not examined prior to patient leaving in AGAINST MEDICAL ADVICE.  Discharge Instructions Medications none reconciles prior to patient leaving AMA, below medications are listed as she takes at home.  Medication List  As of 11/06/2011  9:19 AM   ASK your doctor about these medications         allopurinol 100 MG tablet   Commonly known as: ZYLOPRIM   Take 100 mg by mouth daily.      amLODipine 5 MG tablet   Commonly known as: NORVASC   Take 10 mg by mouth daily.      aspirin 81 MG chewable tablet   Chew 4 tablets (324 mg total) by mouth once.      calcium acetate 667 MG capsule   Commonly known as: PHOSLO   Take 667 mg by mouth 4 (four) times daily.      Coenzyme Q10 150 MG Caps   Take 300 mg by mouth daily. Hold while in hospital      docusate sodium 100 MG capsule   Commonly known as: COLACE   Take 200 mg by mouth at bedtime.      hydrALAZINE 25 MG tablet   Commonly known as: APRESOLINE   Take 50 mg by mouth 3 (three) times daily.      l-methylfolate-B6-B12 3-35-2 MG Tabs   Commonly known as: METANX   Take 1 tablet by mouth 2 (two) times daily.      losartan 50 MG tablet   Commonly known as: COZAAR   Take 100 mg by mouth at bedtime.      metoprolol succinate 50 MG 24 hr tablet   Commonly known as: TOPROL-XL   Take 100 mg by mouth daily. Takes after dialysis (T, Th, Sat)      multivitamin Tabs tablet   Take 1 tablet by  mouth daily.      paricalcitol 5 MCG/ML injection   Commonly known as: ZEMPLAR   Inject 0.2 mLs (1 mcg total) into the vein every Monday, Wednesday, and Friday with hemodialysis.      repaglinide 0.5 MG tablet   Commonly known as: PRANDIN   Take 0.5 mg by mouth 3 (three) times daily before meals.              The results of significant diagnostics from this hospitalization (including imaging, microbiology, ancillary and laboratory) are listed below for reference.    Significant Diagnostic Studies: Dg Chest 1 View  10/11/2011  *RADIOLOGY REPORT*  Clinical Data: Shortness of breath.  Status post dialysis.  CHEST - 1 VIEW  Comparison: Earlier today.  Findings: The pulmonary vasculature and interstitial markings are less prominent.  The cardiac silhouette remains borderline enlarged.  Stable right jugular double-lumen catheter and thoracic spine degenerative changes.  IMPRESSION: Improving  changes of congestive heart failure.  Original Report Authenticated By: Darrol Angel, M.D.   Dg Shoulder Right  10/17/2011  *RADIOLOGY REPORT*  Clinical Data: Right shoulder pain.  RIGHT SHOULDER - 2+ VIEW  Comparison: Portable chest 10/15/2011  Findings: Right shoulder is located.  Degenerative changes are present in the [facility] joint.  Minimal calcifications are likely associated with a right subclavian artery.  A right IJ dialysis catheter is stable.  The right hemithorax is otherwise clear. Previously seen pulmonary vascular congestion is improved.  IMPRESSION:  1.  No acute abnormality. 2.  Stable degenerative changes of the right AC joint. 3.  Improved pulmonary vascular congestion. 4.  Right subclavian artery calcifications.  Original Report Authenticated By: Jamesetta Orleans. MATTERN, M.D.   Ct Head Wo Contrast  10/14/2011  *RADIOLOGY REPORT*  Clinical Data: Altered mental status.  Rule out stroke  CT HEAD WITHOUT CONTRAST  Technique:  Contiguous axial images were obtained from the base of the skull through the vertex without contrast.  Comparison: None.  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 Chronic ischemia in the left frontal white matter is present.  No acute infarct.  Negative for hemorrhage or mass.  Image quality degraded by motion.  Multiple images were repeated due to motion.  IMPRESSION: Chronic left frontal infarct.  No acute abnormality.  Original Report Authenticated By: Camelia Phenes, M.D.   Dg Chest Port 1 View  10/15/2011  *RADIOLOGY REPORT*  Clinical Data: Extubated with renal insufficiency.  PORTABLE CHEST - 1 VIEW  Comparison: 1 day prior  Findings: Interval extubation.  Dialysis catheter unchanged, with tip at high right atrium.  A catheter projects over the upper chest and lower neck.  The nasogastric tube is no longer identified along the course of the esophagus.  Normal heart size.  Possible trace right pleural fluid. No pneumothorax.  Persistent mild pulmonary  venous congestion and developing interstitial edema. No lobar consolidation.  IMPRESSION:  1.  Slight increase in the pulmonary venous congestion and developing interstitial edema. 2.  Extubation.  Presumed removal of nasogastric tube.  There is a catheter which projects over the upper chest and lower neck.  This is presumably external to the patient.  Original Report Authenticated By: Consuello Bossier, M.D.   Dg Chest Port 1 View  10/14/2011  *RADIOLOGY REPORT*  Clinical Data: Assess pulmonary edema.  Hemodialysis with pulmonary congestion  PORTABLE CHEST - 1 VIEW  Comparison: 10/13/2011  Findings: Endotracheal tube is 4.5 cm above the level of the carina, stable in position.  Hemodialysis catheter is in place via a right subclavian approach with the tip located overlying the distal superior vena cava at the level of the superior cavoatrial junction.  A nasogastric tube is in place and the tip is below the level of the hemidiaphragms and not visualized on this exam.  Heart and mediastinal contours are stable.  There has been an interval increase in pulmonary vascular congestion, mild central peribronchial cuffing, fissural prominence and interstitial septal lines compatible with interval development of very mild interstitial edema.  Question small left pleural effusion. Scarring changes at the left lung base are seen with no overt congestive failure or focal infiltrates identified.  IMPRESSION: Interval development of mild interstitial edema pattern with new small left pleural effusion.  Original Report Authenticated By: Bertha Stakes, M.D.   Dg Chest Port 1 View  10/13/2011  *RADIOLOGY REPORT*  Clinical Data: Hemodialysis.  Pulmonary congestion.  PORTABLE CHEST - 1 VIEW  Comparison: 10/13/2011, 0708 hours.  Findings: Improved pulmonary vascular congestion and basilar edema. Cardiopericardial silhouette appears within normal limits. Endotracheal tube remains present with the tip about 5 cm from the carina.   Enteric tube and dialysis catheter unchanged.  IMPRESSION: Improved pulmonary aeration compatible with decreasing edema and vascular congestion after hemodialysis.  Stable support apparatus.  Original Report Authenticated By: Andreas Newport, M.D.   Dg Chest Port 1 View  10/13/2011  *RADIOLOGY REPORT*  Clinical Data: End-stage renal disease, edema, ventilatory support  PORTABLE CHEST - 1 VIEW  Comparison: 10/12/2011  Findings: Stable support apparatus.  Mild vascular and interstitial prominence compatible with interstitial edema.  Basilar atelectasis evident.  Small effusions suspected.  No pneumothorax demonstrated.  IMPRESSION: Stable mild edema pattern.  Original Report Authenticated By: Judie Petit. Ruel Favors, M.D.   Dg Chest Port 1 View  10/12/2011  *RADIOLOGY REPORT*  Clinical Data: Assess endotracheal tube.  PORTABLE CHEST - 1 VIEW  Comparison: 10/11/2011  Findings: Endotracheal tube tip 4.7 cm proximal to the carina. Bibasilar opacities.  Cardiomediastinal contours unchanged.  No definite pneumothorax.  Small effusions not excluded.  Right IJ catheter tip projects over the mid SVC.  IMPRESSION: Endotracheal tube tip 4.7 cm proximal to the carina.  Bibasilar opacities; atelectasis, aspiration, or infiltrate.  Original Report Authenticated By: Waneta Martins, M.D.   Dg Chest Portable 1 View  10/11/2011  **ADDENDUM** CREATED: 10/11/2011 11:35:10  Addendum for further clarification:  The patient was discussed with Dr Ashley Royalty.  There are mildly increased interstitial markings, much of which is chronic, although mild volume overload is suspected.  The bibasilar opacities likely reflect atelectasis in the absence of fever or other infectious symptoms.  **END ADDENDUM** SIGNED BY: Charline Bills, M.D.   10/11/2011  *RADIOLOGY REPORT*  Clinical Data: Shortness of breath  PORTABLE CHEST - 1 VIEW  Comparison: 07/12/2011  Findings: Mild patchy bilateral lower lobe opacities, atelectasis versus pneumonia.  No  frank interstitial edema.  No pneumothorax.  The heart is top normal in size.  Stable right IJ dual lumen dialysis catheter.  IMPRESSION: Mild patchy bilateral lower lobe opacities, atelectasis versus pneumonia.  Original Report Authenticated By: Charline Bills, M.D.   Dg Abd Portable 1v  10/12/2011  *RADIOLOGY REPORT*  Clinical Data: Rule out ileus.  History of renal failure.  PORTABLE ABDOMEN - 1 VIEW  Comparison: None.  Findings: NG tube tip is in the antrum of the stomach.  Moderate stool in the ascending colon.  No disproportionate dilatation of small bowel.  No obvious free intraperitoneal gas.  IMPRESSION: NG tube tip in antrum of the stomach.  Moderate stool in the ascending colon.  Nonobstructive bowel gas pattern.  Original Report Authenticated By: Donavan Burnet, M.D.    Microbiology: No results found for this or any previous visit (from the past 240 hour(s)).   Labs: Basic Metabolic Panel:  Lab 11/06/11 1610 11/04/11 0746 11/02/11 0043  NA 141 137 138  K 3.6 4.2 3.8  CL 98 96 98  CO2 30 30 26   GLUCOSE 99 113* 150*  BUN 41* 48* 75*  CREATININE 5.87* 7.02* 8.83*  CALCIUM 9.8 10.1 9.6  MG -- -- --  PHOS 2.5 3.3 3.5   Liver Function Tests:  Lab 11/06/11 0415 11/04/11 0746 11/02/11 0043  AST -- -- --  ALT -- -- --  ALKPHOS -- -- --  BILITOT -- -- --  PROT -- -- --  ALBUMIN 3.3* 3.1* 3.2*   No results found for this basename: LIPASE:5,AMYLASE:5 in the last 168 hours No results found for this basename: AMMONIA:5 in the last 168 hours CBC:  Lab 11/06/11 0415 11/04/11 0746 11/02/11 0043  WBC 5.9 5.6 6.2  NEUTROABS 2.5 -- 2.9  HGB 9.4* 7.7* 7.9*  HCT 29.7* 24.6* 25.0*  MCV 76.9* 74.5* 74.0*  PLT 201 232 231   Cardiac Enzymes: No results found for this basename: CKTOTAL:5,CKMB:5,CKMBINDEX:5,TROPONINI:5 in the last 168 hours BNP: BNP (last 3 results)  Basename 10/15/11 0707  PROBNP 31669.0*   CBG:  Lab 11/06/11 0328 11/06/11 0204 11/06/11 0057 11/04/11 0759  11/04/11 0553  GLUCAP 96 153* 179* 118* 67*    Signed:  Mandy Fitzwater A  [facility] 11/06/2011, 9:19 AM

## 2011-11-06 NOTE — H&P (Signed)
PCP:   [name], MD    Chief Complaint:   needs Dialysis  HPI: Alexandra [name] is a 66 y.o. female   has a past medical history of Chronic kidney disease; Diabetes mellitus; Hyperlipidemia; Hypertension; Renal disorder; Dialysis care; and Gout due to renal impairment.   Presented with  Patient history of end-stage renal disease presents in need of hemodialysis.  Review of Systems:    Pertinent positives include: Feels a little tired and sleepy  Constitutional:  No weight loss, night sweats, Fevers, chills, fatigue, weight loss  HEENT:  No headaches, Difficulty swallowing,Tooth/dental problems,Sore throat,  No sneezing, itching, ear ache, nasal congestion, post nasal drip,  Cardio-vascular:  No chest pain, Orthopnea, PND, anasarca, dizziness, palpitations.no Bilateral lower extremity swelling  GI:  No heartburn, indigestion, abdominal pain, nausea, vomiting, diarrhea, change in bowel habits, loss of appetite, melena, blood in stool, hematemesis Resp:  no shortness of breath at rest. No dyspnea on exertion, No excess mucus, no productive cough, No non-productive cough, No coughing up of blood.No change in color of mucus.No wheezing. Skin:  no rash or lesions. No jaundice GU:  no dysuria, change in color of urine, no urgency or frequency. No straining to urinate.  No flank pain.  Musculoskeletal:  No joint pain or no joint swelling. No decreased range of motion. No back pain.  Psych:  No change in mood or affect. No depression or anxiety. No memory loss.  Neuro: no localizing neurological complaints, no tingling, no weakness, no double vision, no gait abnormality, no slurred speech, no confusion  Otherwise ROS are negative except for above, 10 systems were reviewed  Past Medical History: Past Medical History  Diagnosis Date  . Chronic kidney disease     esrd  . Diabetes mellitus   . Hyperlipidemia   . Hypertension   . Renal disorder   . Dialysis care       tues, thurs, sat  . Gout due to renal impairment    Past Surgical History  Procedure Date  . Carotid endarterectomy 2002    left  . Btl   . Rectal fissurectomy   . Av fistula placement 10/30/2010    right brachiocephalic   . Fistulogram      Medications: Prior to Admission medications   Medication Sig Start Date End Date Taking? Authorizing Provider  allopurinol (ZYLOPRIM) 100 MG tablet Take 100 mg by mouth daily.     Yes Historical Provider, MD  amLODipine (NORVASC) 5 MG tablet Take 10 mg by mouth daily. 07/13/11  Yes [name], MD  aspirin 81 MG chewable tablet Chew 4 tablets (324 mg total) by mouth once. 10/20/11 10/19/12 Yes Marinda Elk, MD  calcium acetate (PHOSLO) 667 MG capsule Take 667 mg by mouth 4 (four) times daily.    Yes Historical Provider, MD  Coenzyme Q10 150 MG CAPS Take 300 mg by mouth daily. Hold while in hospital   Yes Historical Provider, MD  docusate sodium (COLACE) 100 MG capsule Take 200 mg by mouth at bedtime.     Yes Historical Provider, MD  hydrALAZINE (APRESOLINE) 25 MG tablet Take 50 mg by mouth 3 (three) times daily.  07/18/11  Yes [name], MD  l-methylfolate-B6-B12 (METANX) 3-35-2 MG TABS Take 1 tablet by mouth 2 (two) times daily.    Yes Historical Provider, MD  losartan (COZAAR) 50 MG tablet Take 100 mg by mouth at bedtime.    Yes Historical Provider, MD  metoprolol succinate (TOPROL-XL) 50  MG 24 hr tablet Take 100 mg by mouth daily. Takes after dialysis (T, Th, Sat)   Yes Historical Provider, MD  multivitamin (RENA-VIT) TABS tablet Take 1 tablet by mouth daily.     Yes Historical Provider, MD  paricalcitol (ZEMPLAR) 5 MCG/ML injection Inject 0.2 mLs (1 mcg total) into the vein every Monday, Wednesday, and Friday with hemodialysis. 10/20/11  Yes Marinda Elk, MD  repaglinide (PRANDIN) 0.5 MG tablet Take 0.5 mg by mouth 3 (three) times daily before meals.    Yes Historical Provider, MD    Allergies:   Allergies  Allergen  Reactions  . Codeine Other (See Comments)    Passes out  . Epinephrine Other (See Comments)    Tachycardia, diaphoresis, syncope  . Statins     Liver enzymes rise   PATIENT DOES NOT WANT TO BE GIVEN THIS TYPE OF MEDICATION PER THE [facility]  . Percocet (Oxycodone-Acetaminophen) Other (See Comments)    Passes  out       reports that she has quit smoking. Her smoking use included Cigarettes. She has a 50 pack-year smoking history. She has never used smokeless tobacco. She reports that she does not drink alcohol or use illicit drugs.   Family History: family history includes COPD in her mother; Heart disease in her father; Kidney disease in her mother; and Lymphoma in her son.    Physical Exam: Patient Vitals for the past 24 hrs:  BP Temp Temp src Pulse Resp SpO2  11/06/11 0100 218/70 mmHg 98.5 F (36.9 C) Oral 83  16  97 %  11/05/11 2321 195/75 mmHg 97.5 F (36.4 C) Oral 101  18  99 %    1. General:  in No Acute distress 2. Psychological: Alert and Oriented 3. Head/ENT:   Moist  Mucous Membranes                          Head Non traumatic, neck supple                          Normal Dentition 4. SKIN: normal Skin turgor,  Skin clean Dry and intact no rash 5. Heart: Regular rate and rhythm no Murmur, Rub or gallop 6. Lungs: Clear to auscultation bilaterally, no wheezes or crackles   7. Abdomen: Soft, non-tender, Non distended 8. Lower extremities: no clubbing, cyanosis, trace edema 9. Neurologically Grossly intact, moving all 4 extremities equally 10. MSK: Normal range of motion  body mass index is unknown because there is no height or weight on file.   Labs on Admission:   [facility] 11/04/11 0746  NA 137  K 4.2  CL 96  CO2 30  GLUCOSE 113*  BUN 48*  CREATININE 7.02*  CALCIUM 10.1  MG --  PHOS 3.3    Basename 11/04/11 0746  AST --  ALT --  ALKPHOS --  BILITOT --  PROT --  ALBUMIN 3.1*   No results found for this basename:  LIPASE:2,AMYLASE:2 in the last 72 hours  Basename 11/04/11 0746  WBC 5.6  NEUTROABS --  HGB 7.7*  HCT 24.6*  MCV 74.5*  PLT 232   No results found for this basename: CKTOTAL:3,CKMB:3,CKMBINDEX:3,TROPONINI:3 in the last 72 hours No results found for this basename: TSH,T4TOTAL,FREET3,T3FREE,THYROIDAB in the last 72 hours No results found for this basename: VITAMINB12:2,FOLATE:2,FERRITIN:2,TIBC:2,IRON:2,RETICCTPCT:2 in the last 72 hours Lab Results  Component Value Date   HGBA1C  5.4 08/02/2011    The CrCl is unknown because both a height and weight (above a minimum accepted value) are required for this calculation. ABG    Component Value Date/Time   PHART 7.342* 10/12/2011 0450   HCO3 35.7* 10/12/2011 0450   TCO2 38 10/12/2011 0450   O2SAT 99.0 10/12/2011 0450     No results found for this basename: DDIMER   Radiological Exams on Admission: No results found.  Chart has been reviewed  Assessment/Plan  66 year old female with history of end-stage renal disease presents for her hemodialysis was found to be anemic  Present on Admission:  .Anemia of chronic disease - she's been progressively becoming anemic all her past month. We'll obtain anemia panel she did not endorse any history of melena. She may benefit from erythropoietin  .Diabetes mellitus with end stage renal disease - sliding scale  .End stage renal disease - as per renal will need to be dialyzed today   Prophylaxis: SCD  CODE STATUS: Full code  Other plan as per orders.  I have spent a total of 45 min on this admission  Ezra Denne 11/06/2011, 2:14 AM

## 2011-11-06 NOTE — Progress Notes (Signed)
0330 Pt. Requests to have blood sugar checked results=96 Pt. Was informed.

## 2011-11-06 NOTE — Discharge Summary (Signed)
Alexandra [name] MRN: [id number] DOB/AGE: [birth year] 66 y.o.  Admit date: 11/03/2011 Discharge date: 11/04/2011  Primary Care Physician:  [name], MD   Discharge Diagnoses:   ESRD  DISCHARGE MEDICATION: Pt will likely continue her previous home medications as she always does.    Consults:     SIGNIFICANT DIAGNOSTIC STUDIES:None    No results found for this or any previous visit (from the past 240 hour(s)).  BRIEF ADMITTING H & P: 66 year-old female with known history of ESRD on hemodialysis, diabetes mellitus type 2, hypertension has come for her regular dialysis. Patient denies any chest pain or shortness of breath and is not in any acute distress.    Hospital Course:  Present on Admission:  .Anemia of chronic disease: Pt was noted to have an anemia with Hb of 7.7. A transfusion was discussed with patient and she refused the transfusion and any examination.  .Diabetes mellitus with end stage renal disease: This was not actively addressed by me during this hospitalization as pt left AMA from dialysis.  .End stage renal disease: Pt received her usual HD and per nursing report as documented, was stable after dialysis. Pt left AMA before she could be evaluated.  Marland KitchenPsychosocial:Pt was pleasant during our conversation but firm in her decision about no transfusions at this time, and also in her refusal of an examination. 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 I did discuss with her options for trying to pursue alternative out-pt dialysis and she was receptive to speaking with Dr. Louis Meckel- Nephrologist in [facility] to at least discuss expectations and pursue this avenue for options for out patient dialysis care.  Disposition and Follow-up:  Pt left AMA.   DISCHARGE EXAM: Pt left AMA before consenting to an examination.     Basename 11/06/11 0415 11/04/11 0746  NA 141 137    K 3.6 4.2  CL 98 96  CO2 30 30  GLUCOSE 99 113*  BUN 41* 48*  CREATININE 5.87* 7.02*  CALCIUM 9.8 10.1  MG -- --  PHOS 2.5 3.3    Basename 11/06/11 0415 11/04/11 0746  AST -- --  ALT -- --  ALKPHOS -- --  BILITOT -- --  PROT -- --  ALBUMIN 3.3* 3.1*   No results found for this basename: LIPASE:2,AMYLASE:2 in the last 72 hours  Basename 11/06/11 0415 11/04/11 0746  WBC 5.9 5.6  NEUTROABS 2.5 --  HGB 9.4* 7.7*  HCT 29.7* 24.6*  MCV 76.9* 74.5*  PLT 201 232    Signed: Maurina Fawaz A. 11/06/2011, 8:47 AM

## 2011-11-06 NOTE — Progress Notes (Signed)
Pharmacy notified of request to sent Epogen to Hemodialysis.

## 2011-11-06 NOTE — Progress Notes (Signed)
Pt. Has 3+ edema in right hand and 2 to 3+ edema in right arm. Dr. Bascom Levels notified, requested patient right arm pulse. RN checked right and left arm pulses 96 in right and 100 left regular, temperature warm to touch in both arms. Dr. Bascom Levels ordered Xray and Doppler of right arm to be scheduled as an outpatient procedure.

## 2011-11-06 NOTE — Discharge Summary (Deleted)
Alexandra [name] MRN: [id number] DOB/AGE: [birth year] 68 y.o.  Admit date: 11/03/2011 Discharge date: 11/06/2011  Primary Care Physician:  [name], MD   Discharge Diagnoses:   ESRD  DISCHARGE MEDICATION: Pt will likely continue her previous home medications as she always does.   Consults:   Dr. Rainey Pines   SIGNIFICANT DIAGNOSTIC STUDIES: None Dg Chest 1 View    BRIEF ADMITTING H & P: 66 year-old female with known history of ESRD on hemodialysis, diabetes mellitus type 2, hypertension has come for her regular dialysis. Patient denies any chest pain or shortness of breath and is not in any acute distress.     Hospital Course:  Present on Admission:  .Anemia of chronic disease: Pt was noted to have an anemia with Hb of 7.7. A transfusion was discussed with patient and she refused the transfusion and any examination.   .Diabetes mellitus with end stage renal disease: This was not actively addressed by me during this hospitalization as pt left AMA from dialysis.   .End stage renal disease: Pt received her usual HD and per nursing report as documented, was stable after dialysis. Pt left AMA before she could be evaluated.   Marland KitchenPsychosocial:Pt was pleasant during our conversation but firm in her decision about no transfusions at this time, and also in her refusal of an examination. 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 I did discuss with her options for trying to pursue alternative out-pt dialysis and she was receptive to speaking with Dr. Louis Meckel- Nephrologist in [facility] to at least discuss expectations and pursue this avenue for options for out patient dialysis care.  Disposition and Follow-up:  Pt left AMA.   DISCHARGE EXAM:  Pt left AMA before consenting to an examination.     Basename 11/06/11 0415 11/04/11 0746  NA 141 137  K 3.6 4.2  CL 98 96  CO2 30 30   GLUCOSE 99 113*  BUN 41* 48*  CREATININE 5.87* 7.02*  CALCIUM 9.8 10.1  MG -- --  PHOS 2.5 3.3    Basename 11/06/11 0415 11/04/11 0746  AST -- --  ALT -- --  ALKPHOS -- --  BILITOT -- --  PROT -- --  ALBUMIN 3.3* 3.1*   No results found for this basename: LIPASE:2,AMYLASE:2 in the last 72 hours  Basename 11/06/11 0415 11/04/11 0746  WBC 5.9 5.6  NEUTROABS 2.5 --  HGB 9.4* 7.7*  HCT 29.7* 24.6*  MCV 76.9* 74.5*  PLT 201 232    Signed: Dalyn Kjos A. 11/06/2011, 9:31 AM

## 2011-11-08 ENCOUNTER — Observation Stay (HOSPITAL_COMMUNITY)
Admission: EM | Admit: 2011-11-08 | Discharge: 2011-11-09 | Disposition: A | Discharge status: Home / Self Care | Payer: Medicare HMO | Attending: Internal Medicine | Admitting: Internal Medicine

## 2011-11-08 ENCOUNTER — Encounter (HOSPITAL_COMMUNITY): Payer: Self-pay | Admitting: Emergency Medicine

## 2011-11-08 ENCOUNTER — Observation Stay (HOSPITAL_COMMUNITY): Payer: Medicare HMO

## 2011-11-08 DIAGNOSIS — E119 Type 2 diabetes mellitus without complications: Secondary | ICD-10-CM

## 2011-11-08 DIAGNOSIS — D638 Anemia in other chronic diseases classified elsewhere: Secondary | ICD-10-CM

## 2011-11-08 DIAGNOSIS — N19 Unspecified kidney failure: Secondary | ICD-10-CM

## 2011-11-08 DIAGNOSIS — R778 Other specified abnormalities of plasma proteins: Secondary | ICD-10-CM

## 2011-11-08 DIAGNOSIS — N186 End stage renal disease: Secondary | ICD-10-CM

## 2011-11-08 DIAGNOSIS — N189 Chronic kidney disease, unspecified: Secondary | ICD-10-CM

## 2011-11-08 DIAGNOSIS — J9600 Acute respiratory failure, unspecified whether with hypoxia or hypercapnia: Secondary | ICD-10-CM

## 2011-11-08 DIAGNOSIS — E1122 Type 2 diabetes mellitus with diabetic chronic kidney disease: Secondary | ICD-10-CM

## 2011-11-08 DIAGNOSIS — I120 Hypertensive chronic kidney disease with stage 5 chronic kidney disease or end stage renal disease: Secondary | ICD-10-CM

## 2011-11-08 DIAGNOSIS — E785 Hyperlipidemia, unspecified: Secondary | ICD-10-CM

## 2011-11-08 DIAGNOSIS — Z992 Dependence on renal dialysis: Secondary | ICD-10-CM

## 2011-11-08 DIAGNOSIS — T82898A Other specified complication of vascular prosthetic devices, implants and grafts, initial encounter: Secondary | ICD-10-CM

## 2011-11-08 DIAGNOSIS — D72829 Elevated white blood cell count, unspecified: Secondary | ICD-10-CM

## 2011-11-08 DIAGNOSIS — Z8739 Personal history of other diseases of the musculoskeletal system and connective tissue: Secondary | ICD-10-CM

## 2011-11-08 DIAGNOSIS — N2581 Secondary hyperparathyroidism of renal origin: Secondary | ICD-10-CM

## 2011-11-08 DIAGNOSIS — E8770 Fluid overload, unspecified: Secondary | ICD-10-CM

## 2011-11-08 DIAGNOSIS — R7989 Other specified abnormal findings of blood chemistry: Secondary | ICD-10-CM

## 2011-11-08 DIAGNOSIS — D631 Anemia in chronic kidney disease: Secondary | ICD-10-CM

## 2011-11-08 DIAGNOSIS — I5032 Chronic diastolic (congestive) heart failure: Secondary | ICD-10-CM

## 2011-11-08 DIAGNOSIS — I10 Essential (primary) hypertension: Secondary | ICD-10-CM

## 2011-11-08 DIAGNOSIS — Z720 Tobacco use: Secondary | ICD-10-CM

## 2011-11-08 DIAGNOSIS — M7989 Other specified soft tissue disorders: Secondary | ICD-10-CM

## 2011-11-08 LAB — GLUCOSE, CAPILLARY
Glucose-Capillary: 84 mg/dL (ref 70–99)
Glucose-Capillary: 94 mg/dL (ref 70–99)

## 2011-11-08 MED ORDER — HEPARIN SODIUM (PORCINE) 1000 UNIT/ML DIALYSIS
20.0000 [IU]/kg | INTRAMUSCULAR | Status: DC | PRN
  Administered 2011-11-08: 1200 [IU] via INTRAVENOUS_CENTRAL
  Filled 2011-11-08: qty 2

## 2011-11-08 NOTE — ED Notes (Addendum)
Pt requesting to speak with charge RN. Dialysis notified.

## 2011-11-08 NOTE — ED Notes (Signed)
Here for routine dialysis; denies SOB or any other issues; [name] been by to see her.

## 2011-11-08 NOTE — ED Provider Notes (Addendum)
History   Scribed for Gwyneth Sprout, MD, the patient was seen in room Room/bed info not found . This chart was scribed by [facility].]  CSN: [id number]  Arrival date & time 11/08/11  2013   First MD Initiated Contact with Patient 11/08/11 2220      Chief Complaint  Patient presents with  . Vascular Access Problem    (Consider location/radiation/quality/duration/timing/severity/associated sxs/prior treatment) HPI Melva Faux is a 66 y.o. female who presents to the Emergency Department for a routine hemodialysis. Hx was provided by the pt. She has no complaints and says she feels well. Pt does not c/o of any symptoms today. Pt denies headache, fever, neck pain, sore throat, rash, back pain, chest pain, shortness of breath, abdominal pain, nausea, vomiting, diarrhea dysuria, or extremity pain, edema, weakness, numbness, or tingling. No other pertinent medical symptoms.  Past Medical History  Diagnosis Date  . Chronic kidney disease     esrd  . Diabetes mellitus   . Hyperlipidemia   . Hypertension   . Renal disorder   . Dialysis care     tues, thurs, sat  . 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 Carotid endarterectomy 2002    left  . Btl   . Rectal fissurectomy   . Av fistula placement 10/30/2010    right brachiocephalic   . Fistulogram     Family History  Problem Relation Age of Onset  . COPD Mother   . Kidney disease Mother   . Heart disease Father   . Lymphoma Son     History  Substance Use Topics  . Smoking status: Former Smoker -- 1.0 packs/day for 50 years    Types: Cigarettes  . Smokeless tobacco: Never Used  . Alcohol Use: No    OB History    Grav Para Term Preterm Abortions TAB SAB Ect Mult Living                  Review of Systems  All other systems reviewed and are negative.    Allergies  Codeine; Epinephrine; Percocet; and Statins  Home Medications   Current Outpatient Rx  Name  Route Sig Dispense Refill  . ALLOPURINOL 100 MG PO TABS Oral Take 100 mg by mouth daily.      Marland Kitchen AMLODIPINE BESYLATE 5 MG PO TABS Oral Take 10 mg by mouth daily.    Marland Kitchen CALCIUM ACETATE 667 MG PO CAPS Oral Take 667 mg by mouth 4 (four) times daily.     Marland Kitchen COENZYME Q10 150 MG PO CAPS Oral Take 300 mg by mouth daily. Hold while in hospital    . DOCUSATE SODIUM 100 MG PO CAPS Oral Take 200 mg by mouth at bedtime.      Marland Kitchen HYDRALAZINE HCL 25 MG PO TABS Oral Take 50 mg by mouth 3 (three) times daily.     . L-METHYLFOLATE-B6-B12 3-35-2 MG PO TABS Oral Take 1 tablet by mouth 2 (two) times daily.     Marland Kitchen LOSARTAN POTASSIUM 50 MG PO TABS Oral Take 100 mg by mouth at bedtime.     Marland Kitchen METOPROLOL SUCCINATE ER 50 MG PO TB24 Oral Take 100 mg by mouth daily. Takes after dialysis (T, Th, Sat)    . RENA-VITE PO TABS Oral Take 1 tablet by mouth daily.      Marland Kitchen PARICALCITOL 5 MCG/ML IV SOLN Intravenous Inject 0.2 mLs (1 mcg total) into the vein every Monday,  Wednesday, and Friday with hemodialysis. 1 mL   . REPAGLINIDE 0.5 MG PO TABS Oral Take 0.5 mg by mouth 3 (three) times daily before meals.       BP 194/57  Pulse 95  Temp 98.2 F (36.8 C) (Oral)  Resp 18  Ht 5' 4.96" (1.65 m)  Wt 127 lb 10.3 oz (57.9 kg)  BMI 21.27 kg/m2  SpO2 98%  LMP 09/27/2011  Physical Exam  Nursing note and vitals reviewed. Constitutional: She is oriented to person, place, and time. She appears well-developed and well-nourished.  HENT:  Head: Normocephalic and atraumatic.  Eyes: Pupils are equal, round, and reactive to light.  Neck: Normal range of motion.  Cardiovascular: Normal rate.   Pulmonary/Chest: Effort normal.  Abdominal: Soft.  Musculoskeletal: Normal range of motion. She exhibits edema (Trace edema noted on lower extremitity ).  Neurological: She is alert and oriented to person, place, and time.  Skin: Skin is warm and dry.       Right subclavian catheter noted  Psychiatric: She has a normal mood and affect.    ED  Course  Procedures (including critical care time)   Labs Reviewed  GLUCOSE, CAPILLARY   No results found.   1. ESRD (end stage renal disease)   2. Fluid overload       MDM   Patient is here for her routine dialysis. Last dialyzed Saturday morning. Patient denies any shortness of breath, chest pain or other complaints. She is well-appearing on exam today he has mild edema in the lower extremities and hypertensive. Will admit for dialysis.     I personally performed the services described in this documentation, which was scribed in my presence.  The recorded information has been reviewed and considered.     Gwyneth Sprout, MD 11/08/11 1610  Gwyneth Sprout, MD 11/08/11 2243

## 2011-11-08 NOTE — H&P (Signed)
Alexandra [name] is an 66 y.o. female.  Patient was seen and examined on November 08, 2011 at 11 PM. Nephrologist - Dr. Bascom Levels.  Chief Complaint: Has come for regular dialysis. HPI: 66 year-old female with history of ESRD on hemodialysis, diabetes mellitus2, hypertension has come for her regular dialysis. Patient at this time denies any shortness of breath chest pain. Patient is not in any acute distress.  Past Medical History  Diagnosis Date  . Chronic kidney disease     esrd  . Diabetes mellitus   . Hyperlipidemia   . Hypertension   . Renal disorder   . Dialysis care     tues, thurs, sat  . 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 Carotid endarterectomy 2002    left  . Btl   . Rectal fissurectomy   . Av fistula placement 10/30/2010    right brachiocephalic   . Fistulogram     Family History  Problem Relation Age of Onset  . COPD Mother   . Kidney disease Mother   . Heart disease Father   . Lymphoma Son    Social History:  reports that she has quit smoking. Her smoking use included Cigarettes. She has a 50 pack-year smoking history. She has never used smokeless tobacco. She reports that she does not drink alcohol or use illicit drugs.  Allergies:  Allergies  Allergen Reactions  . Codeine Other (See Comments)    Passes out  . Epinephrine Other (See Comments)    Tachycardia, diaphoresis, syncope  . Percocet (Oxycodone-Acetaminophen) Other (See Comments)    Passes  out  . Statins Other (See Comments)    Liver enzymes rise   PATIENT DOES NOT WANT TO BE GIVEN THIS TYPE OF MEDICATION PER THE [facility]     (Not in a hospital admission)  Results for orders placed during the hospital encounter of 11/08/11 (from the past 48 hour(s))  GLUCOSE, CAPILLARY     Status: Normal   Collection Time   11/08/11  9:50 PM      Component Value Range Comment   Glucose-Capillary 84  70 - 99 mg/dL   GLUCOSE,  CAPILLARY     Status: Normal   Collection Time   11/08/11 10:49 PM      Component Value Range Comment   Glucose-Capillary 94  70 - 99 mg/dL    No results found.  ROS  Blood pressure 194/57, pulse 95, temperature 98.2 F (36.8 C), temperature source Oral, resp. rate 18, height 5' 4.96" (1.65 m), weight 57.9 kg (127 lb 10.3 oz), last menstrual period 09/27/2011, SpO2 98.00%. Physical Exam  Constitutional: She is oriented to person, place, and time. She appears well-developed and well-nourished. No distress.  HENT:  Head: Normocephalic and atraumatic.  Right Ear: External ear normal.  Left Ear: External ear normal.  Nose: Nose normal.  Mouth/Throat: Oropharynx is clear and moist. No oropharyngeal exudate.  Eyes: Conjunctivae are normal. Pupils are equal, round, and reactive to light. Right eye exhibits no discharge. Left eye exhibits no discharge. No scleral icterus.  Neck: Normal range of motion. Neck supple.  Cardiovascular: Normal rate and regular rhythm.   Respiratory: Effort normal and breath sounds normal. No respiratory distress. She has no wheezes. She has no rales.  GI: Soft. Bowel sounds are normal. She exhibits no distension. There is no tenderness. There is no rebound.  Musculoskeletal: Normal range of motion. She exhibits  no edema and no tenderness.  Neurological: She is alert and oriented to person, place, and time.       Moves all extremities.  Skin: Skin is warm and dry. She is not diaphoretic.     Assessment/Plan #1. ESRD on hemodialysis - dialysis per nephrologist. #2. Diabetes mellitus type 2 - continue home medications with sliding-scale coverage. #3. Hypertension - continue medications. Closely observe blood pressure trends after dialysis. Patient will be on when necessary IV hydralazine for systolic blood pressure more than 160.  CODE STATUS - full code.    Eduard Clos. 11/08/2011, 11:28 PM

## 2011-11-08 NOTE — Consult Note (Signed)
Pt. In ER pt. Sleepy. Dialysis. Orders written.

## 2011-11-08 NOTE — ED Notes (Signed)
cbg checked 84

## 2011-11-08 NOTE — ED Notes (Signed)
Cbg was 94

## 2011-11-09 ENCOUNTER — Observation Stay (HOSPITAL_COMMUNITY): Payer: Medicare HMO

## 2011-11-09 LAB — RENAL FUNCTION PANEL
Albumin: 2.4 g/dL — ABNORMAL LOW (ref 3.5–5.2)
Calcium: 8.3 mg/dL — ABNORMAL LOW (ref 8.4–10.5)
GFR calc Af Amer: 7 mL/min — ABNORMAL LOW (ref >90)
GFR calc non Af Amer: 6 mL/min — ABNORMAL LOW (ref >90)
Glucose, Bld: 90 mg/dL (ref 70–99)
Phosphorus: 2.1 mg/dL — ABNORMAL LOW (ref 2.3–4.6)
Potassium: 3.2 mEq/L — ABNORMAL LOW (ref 3.5–5.1)
Sodium: 146 mEq/L — ABNORMAL HIGH (ref 135–145)

## 2011-11-09 LAB — CBC WITH DIFFERENTIAL/PLATELET
Basophils Relative: 1 % (ref 0–1)
Eosinophils Absolute: 0.2 10*3/uL (ref 0.0–0.7)
Hemoglobin: 7.8 g/dL — ABNORMAL LOW (ref 12.0–15.0)
Lymphocytes Relative: 33 % (ref 12–46)
MCHC: 31.5 g/dL (ref 30.0–36.0)
Neutrophils Relative %: 49 % (ref 43–77)
Platelets: 140 10*3/uL — ABNORMAL LOW (ref 150–400)
RBC: 3.19 MIL/uL — ABNORMAL LOW (ref 3.87–5.11)

## 2011-11-09 MED ORDER — EPOETIN ALFA 20000 UNIT/ML IJ SOLN
20000.0000 [IU] | Freq: Once | INTRAMUSCULAR | Status: AC
  Administered 2011-11-09: 20000 [IU] via SUBCUTANEOUS
  Filled 2011-11-09: qty 1

## 2011-11-09 MED ORDER — PARICALCITOL 5 MCG/ML IV SOLN
1.0000 ug | Freq: Once | INTRAVENOUS | Status: AC
  Administered 2011-11-09: 1 ug via INTRAVENOUS
  Filled 2011-11-09: qty 0.2

## 2011-11-09 MED ORDER — PARICALCITOL 5 MCG/ML IV SOLN
INTRAVENOUS | Status: AC
  Administered 2011-11-09: 1 ug via INTRAVENOUS
  Filled 2011-11-09: qty 1

## 2011-11-09 NOTE — Progress Notes (Signed)
tx ended 25 min early at pt demand to come off to use the restroom, refused to sign AMA form. Pt leaving unit to d/c home via w/c w/ tech, VSS, no problems,

## 2011-11-09 NOTE — Discharge Summary (Signed)
Physician Discharge Summary  Alexandra [name] ZOX:[id number] DOB: Nov 17, 1945 DOA: 11/08/2011  PCP: Alexandra [name]  Admit date: 11/08/2011 Discharge date: 11/09/2011  Recommendations for Outpatient Follow-up:  Alexandra [name] on Thursday 11/11/11 as scheduled     Discharge Diagnoses:  Principal Problem:  1.ESRD (end stage renal disease) on dialysis Active Problems: 1.  Hypokalemia-Repleted during dialysis 2.  Anemia of chronic disease 3.  HTN (hypertension), benign 4.  Diabetes mellitus with end stage renal disease   Discharge Condition: Good  Diet recommendation:   Renal/low sodium diet  Filed Weights   11/08/11 2144 11/09/11 0004  Weight: 57.9 kg (127 lb 10.3 oz) 60.15 kg (132 lb 9.7 oz)    History of present illness:  66 year-old female with known history of ESRD on hemodialysis, diabetes mellitus type 2, hypertension has come for her regular dialysis. Patient denies any chest pain or shortness of breath and is not in any acute distress   Hospital Course: Pt presented to ED for her regular tri-weekly HD session. Has tolerated well and upon d/c has no c/o's.   Procedures: Hemodialysis  Consultations: Alexandra Rainey Pines  Discharge Exam: Filed Vitals:   11/09/11 0330  BP: 152/58  Pulse: 96  Temp:   Resp: 20   Filed Vitals:   11/09/11 0200 11/09/11 0230 11/09/11 0300 11/09/11 0330  BP: 138/50 176/65 163/88 152/58  Pulse: 97 94 96 96  Temp:      TempSrc:      Resp: 20 20 20 20   Height:      Weight:      SpO2:       BP 193/79  Pulse 99  Temp 97.2 F (36.2 C) (Oral)  Resp 20  Ht 5' 4.96" (1.65 m)  Wt 58.8 kg (129 lb 10.1 oz)  BMI 21.60 kg/m2  SpO2 95%  LMP 09/27/2011  General Appearance:    Alert, cooperative, no distress, appears stated age  Head:    Normocephalic, without obvious abnormality, atraumatic  Eyes:    PERRL, conjunctiva/corneas clear,    Ears:    Not examined  Nose:   Nares normal, septum midline, mucosa normal, no drainage      or sinus tenderness  Throat:   Lips, mucosa, and tongue normal; teeth and gums normal  Neck:   Supple  Back:     Symmetric, no curvature, ROM normal.  Lungs:     Clear to auscultation bilaterally, respirations unlabored  Chest Wall:    No tenderness or deformity   Heart:    Regular rate and rhythm, S1 and S2 normal, no murmur, rub   or gallop  Breast Exam:    Not examined  Abdomen:     Not examined  Genitalia:    Not examined  Rectal:    Not examined                          Discharge Instructions  1.  Increase activity slowly 2.  Return Wednesday night for dialysis 3.  Keep scheduled appointment with Alexandra [name] this Thursday 4.  Resume previous home meds   Medication List  As of 11/09/2011  4:00 AM   TAKE these medications         allopurinol 100 MG tablet   Commonly known as: ZYLOPRIM   Take 100 mg by mouth daily.      amLODipine 5 MG tablet   Commonly known as: NORVASC   Take 10 mg by mouth daily.  calcium acetate 667 MG capsule   Commonly known as: PHOSLO   Take 667 mg by mouth 4 (four) times daily.      Coenzyme Q10 150 MG Caps   Take 300 mg by mouth daily. Hold while in hospital      docusate sodium 100 MG capsule   Commonly known as: COLACE   Take 200 mg by mouth at bedtime.      hydrALAZINE 25 MG tablet   Commonly known as: APRESOLINE   Take 50 mg by mouth 3 (three) times daily.      l-methylfolate-B6-B12 3-35-2 MG Tabs   Commonly known as: METANX   Take 1 tablet by mouth 2 (two) times daily.      losartan 50 MG tablet   Commonly known as: COZAAR   Take 100 mg by mouth at bedtime.      metoprolol succinate 50 MG 24 hr tablet   Commonly known as: TOPROL-XL   Take 100 mg by mouth daily. Takes after dialysis (T, Th, Sat)      multivitamin Tabs tablet   Take 1 tablet by mouth daily.      paricalcitol 5 MCG/ML injection   Commonly known as: ZEMPLAR   Inject 0.2 mLs (1 mcg total) into the vein every Monday, Wednesday, and Friday with  hemodialysis.      repaglinide 0.5 MG tablet   Commonly known as: PRANDIN   Take 0.5 mg by mouth 3 (three) times daily before meals.           Follow-up Information    Follow up with Alexandra [name].   Contact information:   [address] [facility] [address] [place] [address] [phone]           The results of significant diagnostics from this hospitalization (including imaging, microbiology, ancillary and laboratory) are listed below for reference.    Significant Diagnostic Studies: Dg Chest 1 View  10/11/2011  *RADIOLOGY REPORT*  Clinical Data: Shortness of breath.  Status post dialysis.  CHEST - 1 VIEW  Comparison: Earlier today.  Findings: The pulmonary vasculature and interstitial markings are less prominent.  The cardiac silhouette remains borderline enlarged.  Stable right jugular double-lumen catheter and thoracic spine degenerative changes.  IMPRESSION: Improving changes of congestive heart failure.  Original Report Authenticated By: Darrol Angel, M.D.   Dg Shoulder Right  10/17/2011  *RADIOLOGY REPORT*  Clinical Data: Right shoulder pain.  RIGHT SHOULDER - 2+ VIEW  Comparison: Portable chest 10/15/2011  Findings: Right shoulder is located.  Degenerative changes are present in the [facility] joint.  Minimal calcifications are likely associated with a right subclavian artery.  A right IJ dialysis catheter is stable.  The right hemithorax is otherwise clear. Previously seen pulmonary vascular congestion is improved.  IMPRESSION:  1.  No acute abnormality. 2.  Stable degenerative changes of the right AC joint. 3.  Improved pulmonary vascular congestion. 4.  Right subclavian artery calcifications.  Original Report Authenticated By: Jamesetta Orleans. MATTERN, M.D.   Ct Head Wo Contrast  10/14/2011  *RADIOLOGY REPORT*  Clinical Data: Altered mental status.  Rule out stroke  CT HEAD WITHOUT CONTRAST  Technique:  Contiguous axial images were obtained from the  base of the skull through the vertex without contrast.  Comparison: None.  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 Chronic ischemia in the left frontal white matter is present.  No acute infarct.  Negative for hemorrhage or mass.  Image  quality degraded by motion.  Multiple images were repeated due to motion.  IMPRESSION: Chronic left frontal infarct.  No acute abnormality.  Original Report Authenticated By: Camelia Phenes, M.D.   Dg Chest Port 1 View  10/15/2011  *RADIOLOGY REPORT*  Clinical Data: Extubated with renal insufficiency.  PORTABLE CHEST - 1 VIEW  Comparison: 1 day prior  Findings: Interval extubation.  Dialysis catheter unchanged, with tip at high right atrium.  A catheter projects over the upper chest and lower neck.  The nasogastric tube is no longer identified along the course of the esophagus.  Normal heart size.  Possible trace right pleural fluid. No pneumothorax.  Persistent mild pulmonary venous congestion and developing interstitial edema. No lobar consolidation.  IMPRESSION:  1.  Slight increase in the pulmonary venous congestion and developing interstitial edema. 2.  Extubation.  Presumed removal of nasogastric tube.  There is a catheter which projects over the upper chest and lower neck.  This is presumably external to the patient.  Original Report Authenticated By: Consuello Bossier, M.D.   Dg Chest Port 1 View  10/14/2011  *RADIOLOGY REPORT*  Clinical Data: Assess pulmonary edema.  Hemodialysis with pulmonary congestion  PORTABLE CHEST - 1 VIEW  Comparison: 10/13/2011  Findings: Endotracheal tube is 4.5 cm above the level of the carina, stable in position.  Hemodialysis catheter is in place via a right subclavian approach with the tip located overlying the distal superior vena cava at the level of the superior cavoatrial junction.  A nasogastric tube is in place and the tip is below the level of the hemidiaphragms and not visualized on this exam.  Heart and  mediastinal contours are stable.  There has been an interval increase in pulmonary vascular congestion, mild central peribronchial cuffing, fissural prominence and interstitial septal lines compatible with interval development of very mild interstitial edema.  Question small left pleural effusion. Scarring changes at the left lung base are seen with no overt congestive failure or focal infiltrates identified.  IMPRESSION: Interval development of mild interstitial edema pattern with new small left pleural effusion.  Original Report Authenticated By: Bertha Stakes, M.D.   Dg Chest Port 1 View  10/13/2011  *RADIOLOGY REPORT*  Clinical Data: Hemodialysis.  Pulmonary congestion.  PORTABLE CHEST - 1 VIEW  Comparison: 10/13/2011, 0708 hours.  Findings: Improved pulmonary vascular congestion and basilar edema. Cardiopericardial silhouette appears within normal limits. Endotracheal tube remains present with the tip about 5 cm from the carina.  Enteric tube and dialysis catheter unchanged.  IMPRESSION: Improved pulmonary aeration compatible with decreasing edema and vascular congestion after hemodialysis.  Stable support apparatus.  Original Report Authenticated By: Andreas Newport, M.D.   Dg Chest Port 1 View  10/13/2011  *RADIOLOGY REPORT*  Clinical Data: End-stage renal disease, edema, ventilatory support  PORTABLE CHEST - 1 VIEW  Comparison: 10/12/2011  Findings: Stable support apparatus.  Mild vascular and interstitial prominence compatible with interstitial edema.  Basilar atelectasis evident.  Small effusions suspected.  No pneumothorax demonstrated.  IMPRESSION: Stable mild edema pattern.  Original Report Authenticated By: Judie Petit. Ruel Favors, M.D.   Dg Chest Port 1 View  10/12/2011  *RADIOLOGY REPORT*  Clinical Data: Assess endotracheal tube.  PORTABLE CHEST - 1 VIEW  Comparison: 10/11/2011  Findings: Endotracheal tube tip 4.7 cm proximal to the carina. Bibasilar opacities.  Cardiomediastinal contours  unchanged.  No definite pneumothorax.  Small effusions not excluded.  Right IJ catheter tip projects over the mid SVC.  IMPRESSION: Endotracheal tube tip 4.7  cm proximal to the carina.  Bibasilar opacities; atelectasis, aspiration, or infiltrate.  Original Report Authenticated By: Waneta Martins, M.D.   Dg Chest Portable 1 View  10/11/2011  **ADDENDUM** CREATED: 10/11/2011 11:35:10  Addendum for further clarification:  The patient was discussed with Alexandra Ashley Royalty.  There are mildly increased interstitial markings, much of which is chronic, although mild volume overload is suspected.  The bibasilar opacities likely reflect atelectasis in the absence of fever or other infectious symptoms.  **END ADDENDUM** SIGNED BY: Charline Bills, M.D.   10/11/2011  *RADIOLOGY REPORT*  Clinical Data: Shortness of breath  PORTABLE CHEST - 1 VIEW  Comparison: 07/12/2011  Findings: Mild patchy bilateral lower lobe opacities, atelectasis versus pneumonia.  No frank interstitial edema.  No pneumothorax.  The heart is top normal in size.  Stable right IJ dual lumen dialysis catheter.  IMPRESSION: Mild patchy bilateral lower lobe opacities, atelectasis versus pneumonia.  Original Report Authenticated By: Charline Bills, M.D.   Dg Abd Portable 1v  10/12/2011  *RADIOLOGY REPORT*  Clinical Data: Rule out ileus.  History of renal failure.  PORTABLE ABDOMEN - 1 VIEW  Comparison: None.  Findings: NG tube tip is in the antrum of the stomach.  Moderate stool in the ascending colon.  No disproportionate dilatation of small bowel.  No obvious free intraperitoneal gas.  IMPRESSION: NG tube tip in antrum of the stomach.  Moderate stool in the ascending colon.  Nonobstructive bowel gas pattern.  Original Report Authenticated By: Donavan Burnet, M.D.    Microbiology: No results found for this or any previous visit (from the past 240 hour(s)).   Labs: Basic Metabolic Panel:  Lab 11/09/11 9528 11/06/11 0415 11/04/11 0746  NA 146*  141 137  K 3.2* 3.6 4.2  CL 105 98 96  CO2 25 30 30   GLUCOSE 90 99 113*  BUN 56* 41* 48*  CREATININE 6.69* 5.87* 7.02*  CALCIUM 8.3* 9.8 10.1  MG -- -- --  PHOS 2.1* 2.5 3.3   Liver Function Tests:  Lab 11/09/11 0030 11/06/11 0415 11/04/11 0746  AST -- -- --  ALT -- -- --  ALKPHOS -- -- --  BILITOT -- -- --  PROT -- -- --  ALBUMIN 2.4* 3.3* 3.1*   No results found for this basename: LIPASE:5,AMYLASE:5 in the last 168 hours No results found for this basename: AMMONIA:5 in the last 168 hours CBC:  Lab 11/09/11 0031 11/06/11 0415 11/04/11 0746  WBC 6.2 5.9 5.6  NEUTROABS 3.0 2.5 --  HGB 7.8* 9.4* 7.7*  HCT 24.8* 29.7* 24.6*  MCV 77.7* 76.9* 74.5*  PLT 140* 201 232   Cardiac Enzymes: No results found for this basename: CKTOTAL:5,CKMB:5,CKMBINDEX:5,TROPONINI:5 in the last 168 hours BNP: BNP (last 3 results)  Basename 10/15/11 0707  PROBNP 31669.0*   CBG:  Lab 11/08/11 2249 11/08/11 2150 11/06/11 0328 11/06/11 0204 11/06/11 0057  GLUCAP 94 84 96 153* 179*   I have spoken w/ Alexandra Toniann Fail and he is aware pt being d/c'd.  Time coordinating discharge: 30 minutes  Signed:  Leanne Chang  [facility] 11/09/2011, 4:00 AM

## 2011-11-09 NOTE — Discharge Summary (Addendum)
Physician Discharge Summary  Alexandra [name] ZOX:[id number] DOB: [birth year] DOA: 09/03/2011  PCP: [name], MD  Admit date: 09/03/2011 Discharge date: 09/04/2011  Recommendations for Outpatient Follow-up:  1. Patient is supposed to keep up regular hemodialysis appointments.  Discharge Diagnoses:  Active Problems:  Hypertensive CKD, ESRD on dialysis  ESRD on hemodialysis  Diabetes mellitus with end stage renal disease   Discharge Condition & Diet recommendation: Apparently stable. Patient comes in to the hospital for dialysis approximately 3 times per week and gets admitted. She usually does not wait for M.D. to see and discharge her and leaves immediately after HD, which happened during this admission too.  Filed Weights   09/04/11 0139 09/04/11 0400 09/04/11 0545  Weight: 60.3 kg (132 lb 15 oz) 60.3 kg (132 lb 15 oz) 58 kg (127 lb 13.9 oz)    History of present illness:  66 year old African American female patient with history of ESRD on hemodialysis, DM 2, hypertension, hyperlipidemia, gout comes to the [facility] for her dialysis needs. She is working with the hospital to arrange for outpatient hemodialysis. She had mild dyspnea but without chest pain and was admitted for hemodialysis.  Hospital Course:  Patient presented to the emergency department and then was transferred to the dialysis unit where she underwent dialysis and was discharged from the dialysis unit on 09/04/2011 at 6:35 AM. This M.D. did not see the patient through the course of this hospital stay.  Procedures:  Hemodialysis.  Consultations:  Nephrology: Dr. Jeri Cos.  Discharge Exam: Filed Vitals:   09/04/11 0545  BP: 192/76  Pulse: 76  Temp: 98.1 F (36.7 C)  Resp: 20   Filed Vitals:   09/04/11 0429 09/04/11 0500 09/04/11 0530 09/04/11 0545  BP: 221/70 204/76 196/77 192/76  Pulse: 76 77 72 76  Temp:    98.1 F (36.7 C)  TempSrc:    Oral  Resp: 16 16 20 20   Weight:     58 kg (127 lb 13.9 oz)  SpO2:        This M.D. did not get to see, interview or examine the patient.  Discharge Instructions   Medication List  As of 11/09/2011  2:21 PM   ASK your doctor about these medications         allopurinol 100 MG tablet   Commonly known as: ZYLOPRIM   Take 100 mg by mouth daily.      amLODipine 5 MG tablet   Commonly known as: NORVASC   Take 10 mg by mouth daily.      calcium acetate 667 MG capsule   Commonly known as: PHOSLO   Take 667 mg by mouth 4 (four) times daily.      Coenzyme Q10 150 MG Caps   Take 300 mg by mouth daily. Hold while in hospital      docusate sodium 100 MG capsule   Commonly known as: COLACE   Take 200 mg by mouth at bedtime.      hydrALAZINE 25 MG tablet   Commonly known as: APRESOLINE   Take 50 mg by mouth 3 (three) times daily.      l-methylfolate-B6-B12 3-35-2 MG Tabs   Commonly known as: METANX   Take 1 tablet by mouth 2 (two) times daily.      losartan 50 MG tablet   Commonly known as: COZAAR   Take 100 mg by mouth at bedtime.      multivitamin Tabs tablet   Take 1 tablet by mouth  daily.      repaglinide 0.5 MG tablet   Commonly known as: PRANDIN   Take 0.5 mg by mouth 3 (three) times daily before meals.              The results of significant diagnostics from this hospitalization (including imaging, microbiology, ancillary and laboratory) are listed below for reference.     Microbiology: No results found for this or any previous visit (from the past 240 hour(s)).   Labs: Post dialysis BMP: 6/22: Sodium 137, potassium 4.2, chloride 96, bicarbonate 27, BUN 56, creatinine 6.7, calcium 9.6, glucose 147, phosphorus 3.3 and albumin 3.3.  CBC: Hemoglobin 10.1, hematocrit 32.1, white blood cell 6.4 and platelets 177.  Time coordinating discharge: Less than 30 minutes  Signed:  HONGALGI,ANAND  [facility] 11/09/2011, 2:21 PM

## 2011-11-11 ENCOUNTER — Observation Stay (HOSPITAL_COMMUNITY)
Admission: EM | Admit: 2011-11-11 | Discharge: 2011-11-11 | Disposition: A | Discharge status: Home / Self Care | Payer: Medicare HMO | Attending: Internal Medicine | Admitting: Internal Medicine

## 2011-11-11 ENCOUNTER — Observation Stay (HOSPITAL_COMMUNITY): Payer: Medicare HMO

## 2011-11-11 ENCOUNTER — Encounter (HOSPITAL_COMMUNITY): Payer: Self-pay | Admitting: *Deleted

## 2011-11-11 ENCOUNTER — Inpatient Hospital Stay (HOSPITAL_COMMUNITY): Payer: Medicare HMO

## 2011-11-11 DIAGNOSIS — I10 Essential (primary) hypertension: Secondary | ICD-10-CM | POA: Diagnosis present

## 2011-11-11 DIAGNOSIS — N186 End stage renal disease: Secondary | ICD-10-CM | POA: Diagnosis present

## 2011-11-11 DIAGNOSIS — Z992 Dependence on renal dialysis: Principal | ICD-10-CM | POA: Insufficient documentation

## 2011-11-11 DIAGNOSIS — E119 Type 2 diabetes mellitus without complications: Secondary | ICD-10-CM

## 2011-11-11 DIAGNOSIS — D638 Anemia in other chronic diseases classified elsewhere: Secondary | ICD-10-CM | POA: Insufficient documentation

## 2011-11-11 DIAGNOSIS — Z48812 Encounter for surgical aftercare following surgery on the circulatory system: Secondary | ICD-10-CM

## 2011-11-11 DIAGNOSIS — E1122 Type 2 diabetes mellitus with diabetic chronic kidney disease: Secondary | ICD-10-CM | POA: Diagnosis present

## 2011-11-11 DIAGNOSIS — R0602 Shortness of breath: Secondary | ICD-10-CM | POA: Insufficient documentation

## 2011-11-11 DIAGNOSIS — M7989 Other specified soft tissue disorders: Secondary | ICD-10-CM

## 2011-11-11 DIAGNOSIS — I120 Hypertensive chronic kidney disease with stage 5 chronic kidney disease or end stage renal disease: Secondary | ICD-10-CM | POA: Insufficient documentation

## 2011-11-11 LAB — CBC WITH DIFFERENTIAL/PLATELET
Basophils Relative: 0 % (ref 0–1)
Eosinophils Relative: 5 % (ref 0–5)
Hemoglobin: 9.3 g/dL — ABNORMAL LOW (ref 12.0–15.0)
MCH: 24.6 pg — ABNORMAL LOW (ref 26.0–34.0)
Monocytes Absolute: 0.9 10*3/uL (ref 0.1–1.0)
Monocytes Relative: 15 % — ABNORMAL HIGH (ref 3–12)
Neutrophils Relative %: 51 % (ref 43–77)
Platelets: 143 10*3/uL — ABNORMAL LOW (ref 150–400)
RBC: 3.78 MIL/uL — ABNORMAL LOW (ref 3.87–5.11)
WBC: 5.9 10*3/uL (ref 4.0–10.5)

## 2011-11-11 LAB — GLUCOSE, CAPILLARY
Glucose-Capillary: 120 mg/dL — ABNORMAL HIGH (ref 70–99)
Glucose-Capillary: 57 mg/dL — ABNORMAL LOW (ref 70–99)

## 2011-11-11 LAB — RENAL FUNCTION PANEL
CO2: 28 mEq/L (ref 19–32)
Chloride: 95 mEq/L — ABNORMAL LOW (ref 96–112)
GFR calc Af Amer: 5 mL/min — ABNORMAL LOW (ref >90)
GFR calc non Af Amer: 4 mL/min — ABNORMAL LOW (ref >90)
Glucose, Bld: 156 mg/dL — ABNORMAL HIGH (ref 70–99)
Potassium: 4.1 mEq/L (ref 3.5–5.1)
Sodium: 135 mEq/L (ref 135–145)

## 2011-11-11 MED ORDER — CALCIUM ACETATE 667 MG PO CAPS
667.0000 mg | ORAL_CAPSULE | Freq: Four times a day (QID) | ORAL | Status: DC
  Administered 2011-11-11: 667 mg via ORAL
  Filled 2011-11-11 (×4): qty 1

## 2011-11-11 MED ORDER — PARICALCITOL 5 MCG/ML IV SOLN
INTRAVENOUS | Status: AC
  Administered 2011-11-11: 1 ug via INTRAVENOUS
  Filled 2011-11-11: qty 1

## 2011-11-11 MED ORDER — PARICALCITOL 5 MCG/ML IV SOLN
1.0000 ug | Freq: Once | INTRAVENOUS | Status: AC
  Administered 2011-11-11: 1 ug via INTRAVENOUS
  Filled 2011-11-11: qty 0.2

## 2011-11-11 MED ORDER — EPOETIN ALFA 20000 UNIT/ML IJ SOLN
20000.0000 [IU] | Freq: Once | INTRAMUSCULAR | Status: AC
  Administered 2011-11-11: 20000 [IU] via SUBCUTANEOUS
  Filled 2011-11-11 (×2): qty 1

## 2011-11-11 NOTE — ED Notes (Signed)
Pt here for regular dialysis.

## 2011-11-11 NOTE — ED Provider Notes (Signed)
History     CSN: [id number]  Arrival date & time 11/11/11  0057   First MD Initiated Contact with Patient 11/11/11 0127      Chief Complaint  Patient presents with  . Vascular Access Problem    (Consider location/radiation/quality/duration/timing/severity/associated sxs/prior treatment) HPI HX per PT, ESRD here for routine dialysis and mild SOB, no CP, fevers or syncope.  Symptoms mild in severity. PT comes here for regular dialysis and declines any xrays, lab work or further evaluation in the ED. No pain. No rash/ itching. No palpitations. Recent admit/ intubation.  Past Medical History  Diagnosis Date  . Chronic kidney disease     esrd  . Diabetes mellitus   . Hyperlipidemia   . Hypertension   . Renal disorder   . Dialysis care     tues, thurs, sat  . 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 Carotid endarterectomy 2002    left  . Btl   . Rectal fissurectomy   . Av fistula placement 10/30/2010    right brachiocephalic   . Fistulogram     Family History  Problem Relation Age of Onset  . COPD Mother   . Kidney disease Mother   . Heart disease Father   . Lymphoma Son     History  Substance Use Topics  . Smoking status: Former Smoker -- 1.0 packs/day for 50 years    Types: Cigarettes  . Smokeless tobacco: Never Used  . Alcohol Use: No    OB History    Grav Para Term Preterm Abortions TAB SAB Ect Mult Living                  Review of Systems  Constitutional: Negative for fever and chills.  HENT: Negative for neck pain and neck stiffness.   Eyes: Negative for pain.  Respiratory: Negative for cough.   Cardiovascular: Negative for chest pain.  Gastrointestinal: Negative for abdominal pain.  Genitourinary: Negative for dysuria.  Musculoskeletal: Negative for back pain.  Skin: Negative for rash.  Neurological: Negative for headaches.  All other systems reviewed and are negative.    Allergies  Codeine; Epinephrine;  Percocet; and Statins  Home Medications   Current Outpatient Rx  Name Route Sig Dispense Refill  . ALLOPURINOL 100 MG PO TABS Oral Take 100 mg by mouth daily.      Marland Kitchen AMLODIPINE BESYLATE 5 MG PO TABS Oral Take 10 mg by mouth daily.    Marland Kitchen CALCIUM ACETATE 667 MG PO CAPS Oral Take 667 mg by mouth 4 (four) times daily.     Marland Kitchen COENZYME Q10 150 MG PO CAPS Oral Take 300 mg by mouth daily. Hold while in hospital    . DOCUSATE SODIUM 100 MG PO CAPS Oral Take 200 mg by mouth at bedtime.      Marland Kitchen HYDRALAZINE HCL 25 MG PO TABS Oral Take 50 mg by mouth 3 (three) times daily.     . L-METHYLFOLATE-B6-B12 3-35-2 MG PO TABS Oral Take 1 tablet by mouth 2 (two) times daily.     Marland Kitchen LOSARTAN POTASSIUM 50 MG PO TABS Oral Take 100 mg by mouth at bedtime.     Marland Kitchen METOPROLOL SUCCINATE ER 50 MG PO TB24 Oral Take 100 mg by mouth daily. Takes after dialysis (T, Th, Sat)    . RENA-VITE PO TABS Oral Take 1 tablet by mouth daily.      Marland Kitchen PARICALCITOL 5 MCG/ML IV SOLN  Intravenous Inject 0.2 mLs (1 mcg total) into the vein every Monday, Wednesday, and Friday with hemodialysis. 1 mL   . REPAGLINIDE 0.5 MG PO TABS Oral Take 0.5 mg by mouth 3 (three) times daily before meals.       BP 205/66  Pulse 97  Temp 98 F (36.7 C) (Oral)  Resp 20  SpO2 98%  LMP 09/27/2011  Physical Exam  Constitutional: She is oriented to person, place, and time. She appears well-developed and well-nourished.  HENT:  Head: Normocephalic and atraumatic.  Eyes: Conjunctivae and EOM are normal. Pupils are equal, round, and reactive to light.  Neck: Trachea normal. Neck supple. No thyromegaly present.  Cardiovascular: Normal rate, regular rhythm, S1 normal, S2 normal and normal pulses.     No systolic murmur is present   No diastolic murmur is present  Pulses:      Radial pulses are 2+ on the right side, and 2+ on the left side.  Pulmonary/Chest: Effort normal and breath sounds normal. She has no wheezes. She has no rhonchi. She has no rales. She  exhibits no tenderness.  Abdominal: Soft. Normal appearance and bowel sounds are normal. There is no tenderness. There is no CVA tenderness and negative Murphy's sign.  Musculoskeletal:       BLE:s Calves nontender, no cords or erythema, negative Homans sign  Neurological: She is alert and oriented to person, place, and time. She has normal strength. No cranial nerve deficit or sensory deficit. GCS eye subscore is 4. GCS verbal subscore is 5. GCS motor subscore is 6.  Skin: Skin is warm and dry. No rash noted. She is not diaphoretic.  Psychiatric: Her speech is normal.       Cooperative and appropriate    ED Course  Procedures (including critical care time)  PO meal - tolerates NAD   MDM  Old records, VS and nursing notes reviewed.   MED c/s for admit Dr Kara Pacer recs blood work given recent anemia - PT declines.         Sunnie Nielsen, MD 11/11/11 [phone]

## 2011-11-11 NOTE — Progress Notes (Signed)
*  PRELIMINARY RESULTS* Vascular Ultrasound Duplex Dialysis Access (AVF, AGV) has been completed. The right brachiocephalic graft was visualized and is patent.  Right upper extremity venous duplex completed. Right upper extremity is negative for deep vein thrombosis. There is limited visualization of the right ulnar vein, therefore deep vein thrombosis cannot be excluded in all segments.  11/11/2011 2:19 PM Gertie Fey, RDMS, RDCS

## 2011-11-11 NOTE — ED Notes (Addendum)
SPOKE WITH COREY RN AT HEMODIALYSIS UNIT AND NOTIFIED THAT PT. IS HERE FOR HER HEMODIALYSIS .

## 2011-11-11 NOTE — ED Notes (Signed)
WARM BLANKETS PROVIDED

## 2011-11-11 NOTE — ED Notes (Signed)
PT. GIVEN GRAHAM CRACKERS/ CRANBERRY JUICE.

## 2011-11-11 NOTE — H&P (Signed)
PCP:   [name], MD  Renal Jeri Cos  Chief Complaint:   Need HD  HPI: Alexandra [name] is a 66 y.o. female   has a past medical history of Chronic kidney disease; Diabetes mellitus; Hyperlipidemia; Hypertension; Renal disorder; Dialysis care; and Gout due to renal impairment.   Presented with  Here to gt dialyzed. Patient is sleeping and does not answer much of my questions. But does state that nothing has changed and that she feels fine.   Review of Systems:  Unable to obtain  Past Medical History: Past Medical History  Diagnosis Date  . Chronic kidney disease     esrd  . Diabetes mellitus   . Hyperlipidemia   . Hypertension   . Renal disorder   . Dialysis care     tues, thurs, sat  . Gout due to renal impairment    Past Surgical History  Procedure Date  . Carotid endarterectomy 2002    left  . Btl   . Rectal fissurectomy   . Av fistula placement 10/30/2010    right brachiocephalic   . Fistulogram      Medications: Prior to Admission medications   Medication Sig Start Date End Date Taking? Authorizing Provider  allopurinol (ZYLOPRIM) 100 MG tablet Take 100 mg by mouth daily.     Yes Historical Provider, MD  amLODipine (NORVASC) 5 MG tablet Take 10 mg by mouth daily. 07/13/11  Yes [name], MD  calcium acetate (PHOSLO) 667 MG capsule Take 667 mg by mouth 4 (four) times daily.    Yes Historical Provider, MD  Coenzyme Q10 150 MG CAPS Take 300 mg by mouth daily. Hold while in hospital   Yes Historical Provider, MD  docusate sodium (COLACE) 100 MG capsule Take 200 mg by mouth at bedtime.     Yes Historical Provider, MD  hydrALAZINE (APRESOLINE) 25 MG tablet Take 50 mg by mouth 3 (three) times daily.  07/18/11  Yes [name], MD  l-methylfolate-B6-B12 (METANX) 3-35-2 MG TABS Take 1 tablet by mouth 2 (two) times daily.    Yes Historical Provider, MD  losartan (COZAAR) 50 MG tablet Take 100 mg by mouth at bedtime.    Yes Historical Provider, MD   metoprolol succinate (TOPROL-XL) 50 MG 24 hr tablet Take 100 mg by mouth daily. Takes after dialysis (T, Th, Sat)   Yes Historical Provider, MD  multivitamin (RENA-VIT) TABS tablet Take 1 tablet by mouth daily.     Yes Historical Provider, MD  paricalcitol (ZEMPLAR) 5 MCG/ML injection Inject 0.2 mLs (1 mcg total) into the vein every Monday, Wednesday, and Friday with hemodialysis. 10/20/11  Yes Marinda Elk, MD  repaglinide (PRANDIN) 0.5 MG tablet Take 0.5 mg by mouth 3 (three) times daily before meals.    Yes Historical Provider, MD    Allergies:   Allergies  Allergen Reactions  . Codeine Other (See Comments)    Passes out  . Epinephrine Other (See Comments)    Tachycardia, diaphoresis, syncope  . Percocet (Oxycodone-Acetaminophen) Other (See Comments)    Passes  out  . Statins Other (See Comments)    Liver enzymes rise   PATIENT DOES NOT WANT TO BE GIVEN THIS TYPE OF MEDICATION PER THE ADVISE OF [facility]    Social History:    reports that she has quit smoking. Her smoking use included Cigarettes. She has a 50 pack-year smoking history. She has never used smokeless tobacco. She reports that she does not  drink alcohol or use illicit drugs.   Family History: family history includes COPD in her mother; Heart disease in her father; Kidney disease in her mother; and Lymphoma in her son.    Physical Exam: Patient Vitals for the past 24 hrs:  BP Temp Temp src Pulse Resp SpO2  11/11/11 0106 205/66 mmHg 98 F (36.7 C) Oral 97  20  98 %    1. General:  in No Acute distress 2. Psychological: sleepy  But  Oriented 3. Head/ENT:   Moist Mucous Membranes                          Head Non traumatic, neck supple                          Normal Dentition 4. SKIN: normal  Skin turgor,  Skin clean Dry and intact no rash 5. Heart: Regular rate and rhythm no Murmur, Rub or gallop 6. Lungs: Clear to auscultation bilaterally, no wheezes or crackles   7. Abdomen:  Soft, non-tender, Non distended 8. Lower extremities: no clubbing, cyanosis, trace edema 9. Neurologically Grossly intact, moving all 4 extremities equally 10. MSK: Normal range of motion  body mass index is unknown because there is no height or weight on file.   Labs on Admission:   [facility] 11/09/11 0030  NA 146*  K 3.2*  CL 105  CO2 25  GLUCOSE 90  BUN 56*  CREATININE 6.69*  CALCIUM 8.3*  MG --  PHOS 2.1*    Basename 11/09/11 0030  AST --  ALT --  ALKPHOS --  BILITOT --  PROT --  ALBUMIN 2.4*   No results found for this basename: LIPASE:2,AMYLASE:2 in the last 72 hours  Basename 11/09/11 0031  WBC 6.2  NEUTROABS 3.0  HGB 7.8*  HCT 24.8*  MCV 77.7*  PLT 140*   No results found for this basename: CKTOTAL:3,CKMB:3,CKMBINDEX:3,TROPONINI:3 in the last 72 hours No results found for this basename: TSH,T4TOTAL,FREET3,T3FREE,THYROIDAB in the last 72 hours No results found for this basename: VITAMINB12:2,FOLATE:2,FERRITIN:2,TIBC:2,IRON:2,RETICCTPCT:2 in the last 72 hours Lab Results  Component Value Date   HGBA1C 5.4 08/02/2011    The CrCl is unknown because both a height and weight (above a minimum accepted value) are required for this calculation. ABG    Component Value Date/Time   PHART 7.342* 10/12/2011 0450   HCO3 35.7* 10/12/2011 0450   TCO2 38 10/12/2011 0450   O2SAT 99.0 10/12/2011 0450     No results found for this basename: DDIMER      Cultures:    Component Value Date/Time   SDES BLOOD HEMODIALYSIS CATHETER 10/13/2011 1415   SPECREQUEST BOTTLES DRAWN AEROBIC AND ANAEROBIC 10CC 10/13/2011 1415   CULT NO GROWTH 5 DAYS 10/13/2011 1415   REPTSTATUS 10/19/2011 FINAL 10/13/2011 1415       Radiological Exams on Admission: No results found.  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 Her BP usually improves after HD.   Prophylaxis: SCD   CODE STATUS: FULL CODE  Other plan as per orders.  I have spent a  total of 35 min on this admission  Aoki Wedemeyer 11/11/2011, 3:39 AM

## 2011-11-11 NOTE — Progress Notes (Signed)
Hemodialysis-See Flowsheet Doppler and xray performed with HD today for swelling of R Hand. Pt discharged after HD through ED via wheelchair to awaiting taxi. Pt had no complaints upon completion of treatment and procedures.

## 2011-11-11 NOTE — ED Notes (Signed)
DR. Adela Glimpse AT BEDSIDE SPEAKING WITH PT.

## 2011-11-11 NOTE — ED Provider Notes (Signed)
Medical screening examination/treatment/procedure(s) were performed by non-physician practitioner and as supervising physician I was immediately available for consultation/collaboration.  Donnetta Hutching, MD 11/11/11 1535

## 2011-11-11 NOTE — ED Notes (Signed)
PT. ASSISTED TO SEAT ON A RECLINER , CUP OF HOT WATER GIVEN PER PT'S . REQUEST.

## 2011-11-11 NOTE — Progress Notes (Signed)
Hemodialysis-See Flowsheet Pt arrived to unit via ED in wheelchair. Pt irritable and complaining of wait in ED. Discussed wait with pt. Pt standing weight 60.6kg with an EDW of 56kg per orders. Dr. Bascom Levels notified of large weight gain, order to decrease goal to 3L. Pt also requesting bp medications and prandin. Explained to pt the importance of not taking her BP meds prior to HD to optimize fluid removal. Pt states "I know whats best for me, call Dr. Bascom Levels again." Pt then requesting Prandin (pt had hypoglycemic episode this am in ED), this was explained to pt as well. Dr. Bascom Levels paged of issue and spoke personally with patient. Order to get doppler and xray of hand d/t swelling before pt leaves hospital today. Orders placed. Continue to monitor pt. Prandin and BP meds held per order.

## 2011-11-12 ENCOUNTER — Encounter (HOSPITAL_COMMUNITY): Payer: Self-pay | Admitting: Adult Health

## 2011-11-12 ENCOUNTER — Inpatient Hospital Stay (HOSPITAL_COMMUNITY)
Admission: EM | Admit: 2011-11-12 | Discharge: 2011-11-13 | DRG: 682 | Disposition: A | Discharge status: Home / Self Care | Payer: Medicare HMO | Attending: Internal Medicine | Admitting: Internal Medicine

## 2011-11-12 DIAGNOSIS — D638 Anemia in other chronic diseases classified elsewhere: Secondary | ICD-10-CM

## 2011-11-12 DIAGNOSIS — E1122 Type 2 diabetes mellitus with diabetic chronic kidney disease: Secondary | ICD-10-CM

## 2011-11-12 DIAGNOSIS — E119 Type 2 diabetes mellitus without complications: Secondary | ICD-10-CM | POA: Diagnosis present

## 2011-11-12 DIAGNOSIS — Z8249 Family history of ischemic heart disease and other diseases of the circulatory system: Secondary | ICD-10-CM

## 2011-11-12 DIAGNOSIS — M109 Gout, unspecified: Secondary | ICD-10-CM | POA: Diagnosis present

## 2011-11-12 DIAGNOSIS — D631 Anemia in chronic kidney disease: Secondary | ICD-10-CM | POA: Diagnosis present

## 2011-11-12 DIAGNOSIS — Z992 Dependence on renal dialysis: Secondary | ICD-10-CM

## 2011-11-12 DIAGNOSIS — Z87891 Personal history of nicotine dependence: Secondary | ICD-10-CM

## 2011-11-12 DIAGNOSIS — I120 Hypertensive chronic kidney disease with stage 5 chronic kidney disease or end stage renal disease: Principal | ICD-10-CM | POA: Diagnosis present

## 2011-11-12 DIAGNOSIS — Z888 Allergy status to other drugs, medicaments and biological substances status: Secondary | ICD-10-CM

## 2011-11-12 DIAGNOSIS — N189 Chronic kidney disease, unspecified: Secondary | ICD-10-CM | POA: Diagnosis present

## 2011-11-12 DIAGNOSIS — N186 End stage renal disease: Secondary | ICD-10-CM | POA: Diagnosis present

## 2011-11-12 NOTE — ED Notes (Signed)
Here for dialysis 

## 2011-11-13 ENCOUNTER — Inpatient Hospital Stay (HOSPITAL_COMMUNITY): Payer: Medicare HMO

## 2011-11-13 DIAGNOSIS — N186 End stage renal disease: Secondary | ICD-10-CM

## 2011-11-13 DIAGNOSIS — E1129 Type 2 diabetes mellitus with other diabetic kidney complication: Secondary | ICD-10-CM

## 2011-11-13 DIAGNOSIS — D638 Anemia in other chronic diseases classified elsewhere: Secondary | ICD-10-CM

## 2011-11-13 DIAGNOSIS — N058 Unspecified nephritic syndrome with other morphologic changes: Secondary | ICD-10-CM

## 2011-11-13 LAB — RETICULOCYTES
Retic Count, Absolute: 51 10*3/uL (ref 19.0–186.0)
Retic Ct Pct: 1.3 % (ref 0.4–3.1)

## 2011-11-13 LAB — CBC WITH DIFFERENTIAL/PLATELET
Basophils Absolute: 0 10*3/uL (ref 0.0–0.1)
Basophils Relative: 1 % (ref 0–1)
Eosinophils Absolute: 0.3 10*3/uL (ref 0.0–0.7)
HCT: 30.4 % — ABNORMAL LOW (ref 36.0–46.0)
MCH: 24.1 pg — ABNORMAL LOW (ref 26.0–34.0)
MCHC: 31.3 g/dL (ref 30.0–36.0)
Monocytes Absolute: 0.8 10*3/uL (ref 0.1–1.0)
Neutro Abs: 3 10*3/uL (ref 1.7–7.7)
Neutrophils Relative %: 46 % (ref 43–77)
RDW: 22.8 % — ABNORMAL HIGH (ref 11.5–15.5)

## 2011-11-13 LAB — RENAL FUNCTION PANEL
Albumin: 3.3 g/dL — ABNORMAL LOW (ref 3.5–5.2)
BUN: 50 mg/dL — ABNORMAL HIGH (ref 6–23)
Chloride: 99 mEq/L (ref 96–112)
Creatinine, Ser: 7.09 mg/dL — ABNORMAL HIGH (ref 0.50–1.10)
GFR calc non Af Amer: 5 mL/min — ABNORMAL LOW (ref >90)
Phosphorus: 2.7 mg/dL (ref 2.3–4.6)

## 2011-11-13 LAB — IRON AND TIBC
Saturation Ratios: 7 % — ABNORMAL LOW (ref 20–55)
UIBC: 258 ug/dL (ref 125–400)

## 2011-11-13 LAB — VITAMIN B12: Vitamin B-12: 1967 pg/mL — ABNORMAL HIGH (ref 211–911)

## 2011-11-13 MED ORDER — PARICALCITOL 5 MCG/ML IV SOLN
1.0000 ug | Freq: Once | INTRAVENOUS | Status: AC
  Administered 2011-11-13: 1 ug via INTRAVENOUS
  Filled 2011-11-13: qty 0.2

## 2011-11-13 MED ORDER — EPOETIN ALFA 20000 UNIT/ML IJ SOLN
20000.0000 [IU] | Freq: Once | INTRAMUSCULAR | Status: AC
  Administered 2011-11-13: 20000 [IU] via SUBCUTANEOUS
  Filled 2011-11-13 (×2): qty 1

## 2011-11-13 MED ORDER — PARICALCITOL 5 MCG/ML IV SOLN
INTRAVENOUS | Status: AC
  Administered 2011-11-13: 1 ug via INTRAVENOUS
  Filled 2011-11-13: qty 1

## 2011-11-13 NOTE — H&P (Signed)
PCP:   [name], MD    Chief Complaint:   Needs dialyses  HPI: Alexandra [name] is a 66 y.o. female   has a past medical history of Chronic kidney disease; Diabetes mellitus; Hyperlipidemia; Hypertension; Renal disorder; Dialysis care; and Gout due to renal impairment.   Presented with  Here needing dialysis. Anemia improved. No complaints  Review of Systems:    Pertinent positives include: none  Constitutional:  No weight loss, night sweats, Fevers, chills, fatigue, weight loss  HEENT:  No headaches, Difficulty swallowing,Tooth/dental problems,Sore throat,  No sneezing, itching, ear ache, nasal congestion, post nasal drip,  Cardio-vascular:  No chest pain, Orthopnea, PND, anasarca, dizziness, palpitations.no Bilateral lower extremity swelling  GI:  No heartburn, indigestion, abdominal pain, nausea, vomiting, diarrhea, change in bowel habits, loss of appetite, melena, blood in stool, hematemesis Resp:  no shortness of breath at rest. No dyspnea on exertion, No excess mucus, no productive cough, No non-productive cough, No coughing up of blood.No change in color of mucus.No wheezing. Skin:  no rash or lesions. No jaundice GU:  no dysuria, change in color of urine, no urgency or frequency. No straining to urinate.  No flank pain.  Musculoskeletal:  No joint pain or no joint swelling. No decreased range of motion. No back pain.  Psych:  No change in mood or affect. No depression or anxiety. No memory loss.  Neuro: no localizing neurological complaints, no tingling, no weakness, no double vision, no gait abnormality, no slurred speech, no confusion  Otherwise ROS are negative except for above, 10 systems were reviewed  Past Medical History: Past Medical History  Diagnosis Date  . Chronic kidney disease     esrd  . Diabetes mellitus   . Hyperlipidemia   . Hypertension   . Renal disorder   . Dialysis care     tues, thurs, sat  . Gout due to renal  impairment    Past Surgical History  Procedure Date  . Carotid endarterectomy 2002    left  . Btl   . Rectal fissurectomy   . Av fistula placement 10/30/2010    right brachiocephalic   . Fistulogram      Medications: Prior to Admission medications   Medication Sig Start Date End Date Taking? Authorizing Provider  allopurinol (ZYLOPRIM) 100 MG tablet Take 100 mg by mouth daily.     Yes Historical Provider, MD  amLODipine (NORVASC) 5 MG tablet Take 10 mg by mouth daily. 07/13/11  Yes [name], MD  calcium acetate (PHOSLO) 667 MG capsule Take 667 mg by mouth 4 (four) times daily.    Yes Historical Provider, MD  Coenzyme Q10 150 MG CAPS Take 300 mg by mouth daily. Hold while in hospital   Yes Historical Provider, MD  docusate sodium (COLACE) 100 MG capsule Take 200 mg by mouth at bedtime.     Yes Historical Provider, MD  hydrALAZINE (APRESOLINE) 25 MG tablet Take 50 mg by mouth 3 (three) times daily.  07/18/11  Yes [name], MD  l-methylfolate-B6-B12 (METANX) 3-35-2 MG TABS Take 1 tablet by mouth 2 (two) times daily.    Yes Historical Provider, MD  losartan (COZAAR) 50 MG tablet Take 100 mg by mouth at bedtime.    Yes Historical Provider, MD  metoprolol succinate (TOPROL-XL) 50 MG 24 hr tablet Take 100 mg by mouth daily. Takes after dialysis (T, Th, Sat)   Yes Historical Provider, MD  multivitamin (RENA-VIT) TABS tablet Take 1 tablet by mouth  daily.     Yes Historical Provider, MD  paricalcitol (ZEMPLAR) 5 MCG/ML injection Inject 0.2 mLs (1 mcg total) into the vein every Monday, Wednesday, and Friday with hemodialysis. 10/20/11  Yes Marinda Elk, MD  repaglinide (PRANDIN) 0.5 MG tablet Take 0.5 mg by mouth 3 (three) times daily before meals.    Yes Historical Provider, MD    Allergies:   Allergies  Allergen Reactions  . Codeine Other (See Comments)    Passes out  . Epinephrine Other (See Comments)    Tachycardia, diaphoresis, syncope  . Percocet  (Oxycodone-Acetaminophen) Other (See Comments)    Passes  out  . Statins Other (See Comments)    Liver enzymes rise   PATIENT DOES NOT WANT TO BE GIVEN THIS TYPE OF MEDICATION PER THE AD[facility]ISE OF [facility]      reports that she has quit smoking. Her smoking use included Cigarettes. She has a 50 pack-year smoking history. She has never used smokeless tobacco. She reports that she does not drink alcohol or use illicit drugs.   Family History: family history includes COPD in her mother; Heart disease in her father; Kidney disease in her mother; and Lymphoma in her son.    Physical Exam: Patient Vitals for the past 24 hrs:  BP Temp Temp src Pulse Resp SpO2  11/12/11 2344 193/84 mmHg 97.1 F (36.2 C) Oral 107  18  96 %    1. General:  in No Acute distress 2. Psychological: sleepy but Oriented 3. Head/ENT:   MoistMucous Membranes                          Head Non traumatic, neck supple                          Normal Dentition 4. SKIN: normal  Skin turgor,  Skin clean Dry and intact no rash 5. Heart: Regular rate and rhythm no Murmur, Rub or gallop 6. Lungs: Clear to auscultation bilaterally, no wheezes or crackles   7. Abdomen: Soft, non-tender, Non distended 8. Lower extremities: no clubbing, cyanosis, or edema 9. Neurologically Grossly intact, moving all 4 extremities equally 10. MSK: Normal range of motion  body mass index is unknown because there is no height or weight on file.   Labs on Admission:   [facility] 11/11/11 0548  NA 135  K 4.1  CL 95*  CO2 28  GLUCOSE 156*  BUN 62*  CREATININE 8.62*  CALCIUM 9.9  MG --  PHOS 2.5    Basename 11/11/11 0548  AST --  ALT --  ALKPHOS --  BILITOT --  PROT --  ALBUMIN 3.2*   No results found for this basename: LIPASE:2,AMYLASE:2 in the last 72 hours  Basename 11/11/11 0548  WBC 5.9  NEUTROABS 3.0  HGB 9.3*  HCT 29.1*  MCV 77.0*  PLT 143*   No results found for this basename:  CKTOTAL:3,CKMB:3,CKMBINDEX:3,TROPONINI:3 in the last 72 hours No results found for this basename: TSH,T4TOTAL,FREET3,T3FREE,THYROIDAB in the last 72 hours No results found for this basename: VITAMINB12:2,FOLATE:2,FERRITIN:2,TIBC:2,IRON:2,RETICCTPCT:2 in the last 72 hours Lab Results  Component Value Date   HGBA1C 5.4 08/02/2011    The CrCl is unknown because both a height and weight (above a minimum accepted value) are required for this calculation. ABG    Component Value Date/Time   PHART 7.342* 10/12/2011 0450   HCO3 35.7* 10/12/2011 0450   TCO2 38  10/12/2011 0450   O2SAT 99.0 10/12/2011 0450     No results found for this basename: DDIMER      Cultures:    Component Value Date/Time   SDES BLOOD HEMODIALYSIS CATHETER 10/13/2011 1415   SPECREQUEST BOTTLES DRAWN AEROBIC AND ANAEROBIC 10CC 10/13/2011 1415   CULT NO GROWTH 5 DAYS 10/13/2011 1415   REPTSTATUS 10/19/2011 FINAL 10/13/2011 1415       Radiological Exams on Admission: Dg Forearm Right  11/11/2011  *RADIOLOGY REPORT*  Clinical Data: Swelling and arm pain  RIGHT FOREARM - 2 VIEW  Comparison: None.  Findings: Postoperative changes are noted about the elbow joint. Diffuse soft tissue edema is noted.  No acute bony abnormality is seen.  IMPRESSION: Soft tissue swelling without acute bony abnormality.   Original Report Authenticated By: Phillips Odor, M.D.    Dg Hand 2 View Right  11/11/2011  *RADIOLOGY REPORT*  Clinical Data: Right hand swelling  RIGHT HAND - 2 VIEW  Comparison: None.  Findings:  Mild degenerate changes are noted in the interphalangeal joints.  No acute fracture or dislocation is seen.  Generalized soft tissue swelling is noted about the metacarpals.  IMPRESSION: Generalized soft tissue swelling without acute bony abnormality.   Original Report Authenticated By: Phillips Odor, M.D.     Chart has been reviewed  Assessment/Plan  66 yo w hx of ESRD here to get HD  Present on Admission:  .ESRD (end stage renal  disease) on dialysis - admit, HD as per renal .Anemia of renal disease - improving Diabetes - SSI  Prophylaxis: SCD    CODE STATUS:FULL CODE  Other plan as per orders.  I have spent a total of 35 min on this admission  Jaylei Fuerte 11/13/2011, 3:27 AM

## 2011-11-13 NOTE — ED Notes (Signed)
Pt. Here for dialysis

## 2011-11-13 NOTE — ED Provider Notes (Signed)
Medical screening examination/treatment/procedure(s) were performed by non-physician practitioner and as supervising physician I was immediately available for consultation/collaboration.    Vida Roller, MD 11/13/11 [phone]

## 2011-11-13 NOTE — Progress Notes (Signed)
Pt arrived in HD unit very upset stating that she felt like she was a being treated like a pawn. She had seen another hd pt come to dialysis and was upset that she didn't come too. Pt stated that she thought her blood sugar was low so blood sugar was checked as we placed her on HD and it was 65 pt eating graham crackers and drinking juice (grape) also ate a protein bar and a breakfast snack tray was ordered for her.  Blood sugar recheck in 15 minutes was 124.

## 2011-11-13 NOTE — ED Notes (Addendum)
Moved pt to hospital bed per her request and gave graham crackers for CBG 75.

## 2011-11-13 NOTE — Progress Notes (Signed)
1100 am pt left unit per w/c to meet friend to drive her home. Last blood sugar at end of HD was 148

## 2011-11-13 NOTE — ED Notes (Signed)
Pt took her own med, Metoprolol 50 mg

## 2011-11-13 NOTE — ED Provider Notes (Signed)
History     CSN: [id number]  Arrival date & time 11/12/11  2330   First MD Initiated Contact with Patient 11/13/11 0046      Chief Complaint  Patient presents with  . Vascular Access Problem   HPI  History provided by the patient and previous charts. Patient 66 year old female with end-stage renal disease on dialysis Tuesday Thursdays and Saturdays who presents to the emergency room for request for regular dialysis. Patient presents to the emergency room frequently for regular dialysis treatments. Patient has no other complaints this evening. She denies any chest pain, shortness of breath, increased swelling any recent fever, chills or sweats.    Past Medical History  Diagnosis Date  . Chronic kidney disease     esrd  . Diabetes mellitus   . Hyperlipidemia   . Hypertension   . Renal disorder   . Dialysis care     tues, thurs, sat  . 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 Carotid endarterectomy 2002    left  . Btl   . Rectal fissurectomy   . Av fistula placement 10/30/2010    right brachiocephalic   . Fistulogram     Family History  Problem Relation Age of Onset  . COPD Mother   . Kidney disease Mother   . Heart disease Father   . Lymphoma Son     History  Substance Use Topics  . Smoking status: Former Smoker -- 1.0 packs/day for 50 years    Types: Cigarettes  . Smokeless tobacco: Never Used  . Alcohol Use: No    OB History    Grav Para Term Preterm Abortions TAB SAB Ect Mult Living                  Review of Systems  Constitutional: Negative for fever and chills.  Respiratory: Negative for cough and shortness of breath.   Cardiovascular: Negative for chest pain and leg swelling.    Allergies  Codeine; Epinephrine; Percocet; and Statins  Home Medications   Current Outpatient Rx  Name Route Sig Dispense Refill  . ALLOPURINOL 100 MG PO TABS Oral Take 100 mg by mouth daily.      Marland Kitchen AMLODIPINE BESYLATE 5 MG PO  TABS Oral Take 10 mg by mouth daily.    Marland Kitchen CALCIUM ACETATE 667 MG PO CAPS Oral Take 667 mg by mouth 4 (four) times daily.     Marland Kitchen COENZYME Q10 150 MG PO CAPS Oral Take 300 mg by mouth daily. Hold while in hospital    . DOCUSATE SODIUM 100 MG PO CAPS Oral Take 200 mg by mouth at bedtime.      Marland Kitchen HYDRALAZINE HCL 25 MG PO TABS Oral Take 50 mg by mouth 3 (three) times daily.     . L-METHYLFOLATE-B6-B12 3-35-2 MG PO TABS Oral Take 1 tablet by mouth 2 (two) times daily.     Marland Kitchen LOSARTAN POTASSIUM 50 MG PO TABS Oral Take 100 mg by mouth at bedtime.     Marland Kitchen METOPROLOL SUCCINATE ER 50 MG PO TB24 Oral Take 100 mg by mouth daily. Takes after dialysis (T, Th, Sat)    . RENA-VITE PO TABS Oral Take 1 tablet by mouth daily.      Marland Kitchen PARICALCITOL 5 MCG/ML IV SOLN Intravenous Inject 0.2 mLs (1 mcg total) into the vein every Monday, Wednesday, and Friday with hemodialysis. 1 mL   . REPAGLINIDE 0.5 MG PO TABS Oral  Take 0.5 mg by mouth 3 (three) times daily before meals.       BP 193/84  Pulse 107  Temp 97.1 F (36.2 C) (Oral)  Resp 18  SpO2 96%  LMP 09/27/2011  Physical Exam  Nursing note and vitals reviewed. Constitutional: She is oriented to person, place, and time. She appears well-developed and well-nourished. No distress.  HENT:  Head: Normocephalic.  Cardiovascular: Normal rate and regular rhythm.   Pulmonary/Chest: Effort normal and breath sounds normal. No respiratory distress. She has no wheezes. She has no rales.  Neurological: She is alert and oriented to person, place, and time.  Skin: Skin is warm and dry.  Psychiatric: She has a normal mood and affect. Her behavior is normal.    ED Course  Procedures     1. End stage renal disease       MDM  Patient seen and evaluated patient with no complaints. Patient reports being here for regular dialysis.   Spoke with tried hospitalist they will see patient and admit to dialysis    Angus Seller, [place] 11/13/11 [phone]

## 2011-11-15 ENCOUNTER — Observation Stay (HOSPITAL_COMMUNITY)
Admission: EM | Admit: 2011-11-15 | Discharge: 2011-11-16 | Disposition: A | Discharge status: Home / Self Care | Payer: Medicare HMO | Attending: Internal Medicine | Admitting: Internal Medicine

## 2011-11-15 ENCOUNTER — Encounter (HOSPITAL_COMMUNITY): Payer: Self-pay | Admitting: Emergency Medicine

## 2011-11-15 DIAGNOSIS — D638 Anemia in other chronic diseases classified elsewhere: Secondary | ICD-10-CM | POA: Diagnosis present

## 2011-11-15 DIAGNOSIS — N186 End stage renal disease: Secondary | ICD-10-CM | POA: Insufficient documentation

## 2011-11-15 DIAGNOSIS — I10 Essential (primary) hypertension: Secondary | ICD-10-CM | POA: Diagnosis present

## 2011-11-15 DIAGNOSIS — Z992 Dependence on renal dialysis: Principal | ICD-10-CM | POA: Insufficient documentation

## 2011-11-15 DIAGNOSIS — E119 Type 2 diabetes mellitus without complications: Secondary | ICD-10-CM | POA: Insufficient documentation

## 2011-11-15 DIAGNOSIS — E785 Hyperlipidemia, unspecified: Secondary | ICD-10-CM | POA: Diagnosis present

## 2011-11-15 DIAGNOSIS — Z720 Tobacco use: Secondary | ICD-10-CM | POA: Diagnosis present

## 2011-11-15 DIAGNOSIS — I5032 Chronic diastolic (congestive) heart failure: Secondary | ICD-10-CM

## 2011-11-15 DIAGNOSIS — I120 Hypertensive chronic kidney disease with stage 5 chronic kidney disease or end stage renal disease: Secondary | ICD-10-CM | POA: Insufficient documentation

## 2011-11-15 LAB — GLUCOSE, CAPILLARY: Glucose-Capillary: 130 mg/dL — ABNORMAL HIGH (ref 70–99)

## 2011-11-15 MED ORDER — HEPARIN SODIUM (PORCINE) 1000 UNIT/ML DIALYSIS
1100.0000 [IU] | INTRAMUSCULAR | Status: DC | PRN
  Administered 2011-11-16: 1100 [IU] via INTRAVENOUS_CENTRAL
  Filled 2011-11-15: qty 2

## 2011-11-15 NOTE — ED Notes (Signed)
PT. IS HERE FOR HER ROUTINE HEMODIALYSIS , NO OTHER COMPLAINTS OR CONCERNS , RESPIRATIONS UNLABORED , AMBULATORY.

## 2011-11-15 NOTE — ED Notes (Signed)
Check CBG 130

## 2011-11-15 NOTE — ED Notes (Signed)
Dr Audrie Lia speaking with pt.

## 2011-11-15 NOTE — Consult Note (Signed)
  Pt. In ER she is stable. Dialysis orders written.

## 2011-11-16 ENCOUNTER — Encounter (HOSPITAL_COMMUNITY): Payer: Self-pay | Admitting: Internal Medicine

## 2011-11-16 ENCOUNTER — Observation Stay (HOSPITAL_COMMUNITY): Payer: Medicare HMO

## 2011-11-16 DIAGNOSIS — I509 Heart failure, unspecified: Secondary | ICD-10-CM

## 2011-11-16 DIAGNOSIS — I5032 Chronic diastolic (congestive) heart failure: Secondary | ICD-10-CM

## 2011-11-16 DIAGNOSIS — N058 Unspecified nephritic syndrome with other morphologic changes: Secondary | ICD-10-CM

## 2011-11-16 DIAGNOSIS — E1129 Type 2 diabetes mellitus with other diabetic kidney complication: Secondary | ICD-10-CM

## 2011-11-16 DIAGNOSIS — N186 End stage renal disease: Secondary | ICD-10-CM

## 2011-11-16 LAB — CBC WITH DIFFERENTIAL/PLATELET
Basophils Absolute: 0.1 10*3/uL (ref 0.0–0.1)
Basophils Relative: 1 % (ref 0–1)
HCT: 29.9 % — ABNORMAL LOW (ref 36.0–46.0)
Hemoglobin: 9.3 g/dL — ABNORMAL LOW (ref 12.0–15.0)
Lymphocytes Relative: 32 % (ref 12–46)
Monocytes Relative: 9 % (ref 3–12)
Neutro Abs: 4 10*3/uL (ref 1.7–7.7)
Neutrophils Relative %: 55 % (ref 43–77)
RDW: 22.7 % — ABNORMAL HIGH (ref 11.5–15.5)
WBC: 7.2 10*3/uL (ref 4.0–10.5)

## 2011-11-16 LAB — RENAL FUNCTION PANEL
Albumin: 3.3 g/dL — ABNORMAL LOW (ref 3.5–5.2)
BUN: 71 mg/dL — ABNORMAL HIGH (ref 6–23)
Chloride: 99 mEq/L (ref 96–112)
GFR calc Af Amer: 5 mL/min — ABNORMAL LOW (ref >90)
GFR calc non Af Amer: 4 mL/min — ABNORMAL LOW (ref >90)
Potassium: 4 mEq/L (ref 3.5–5.1)
Sodium: 139 mEq/L (ref 135–145)

## 2011-11-16 LAB — GLUCOSE, CAPILLARY
Glucose-Capillary: 235 mg/dL — ABNORMAL HIGH (ref 70–99)
Glucose-Capillary: 92 mg/dL (ref 70–99)

## 2011-11-16 MED ORDER — PARICALCITOL 5 MCG/ML IV SOLN
INTRAVENOUS | Status: AC
  Administered 2011-11-16: 1 ug via INTRAVENOUS
  Filled 2011-11-16: qty 1

## 2011-11-16 MED ORDER — PARICALCITOL 5 MCG/ML IV SOLN
1.0000 ug | Freq: Once | INTRAVENOUS | Status: AC
  Administered 2011-11-16: 1 ug via INTRAVENOUS
  Filled 2011-11-16: qty 0.2

## 2011-11-16 MED ORDER — EPOETIN ALFA 20000 UNIT/ML IJ SOLN
20000.0000 [IU] | Freq: Once | INTRAMUSCULAR | Status: AC
  Administered 2011-11-16: 20000 [IU] via SUBCUTANEOUS
  Filled 2011-11-16: qty 1

## 2011-11-16 NOTE — ED Notes (Signed)
Transported to  Hemodialysis. 

## 2011-11-16 NOTE — H&P (Signed)
[facility] History and Physical  Emma Birchler Offutt ZHY:[id number] DOB: [birth year]    PCP:   [name], MD  Chief Complaint: Here for routine dialysis.   HPI:  Alexandra [name] is an 66 y.o. female with ESRD, AODM, HTN, DM, gout, presents here for her routine dialysis. SHe has no complaint today. Denied CP, SOB, swelling, fever, chills, n/v, black or bloody stool. She already has her dialysis orders.   Rewiew of Systems:  Constitutional: Negative for malaise, fever and chills. No significant weight loss or weight gain  Eyes: Negative for eye pain, redness and discharge, diplopia, visual changes, or flashes of light.  ENMT: Negative for ear pain, hoarseness, nasal congestion, sinus pressure and sore throat. No headaches; tinnitus, drooling, or problem swallowing.  Cardiovascular: Negative for chest pain, palpitations, diaphoresis, dyspnea and peripheral edema. ; No orthopnea, PND  Respiratory: Negative for cough, hemoptysis, wheezing and stridor. No pleuritic chestpain.  Gastrointestinal: Negative for nausea, vomiting, diarrhea, constipation, abdominal pain, melena, blood in stool, hematemesis, jaundice and rectal bleeding.  Genitourinary: Negative for frequency, dysuria, incontinence,flank pain and hematuria;  Musculoskeletal: Negative for back pain and neck pain. Negative for swelling and trauma.;  Skin: . Negative for pruritus, rash, abrasions, bruising and skin lesion.; ulcerations  Neuro: Negative for headache, lightheadedness and neck stiffness. Negative for weakness, altered level of consciousness , altered mental status, extremity weakness, burning feet, involuntary movement, seizure and syncope.  Psych: negative for anxiety, depression, insomnia, tearfulness, panic attacks, hallucinations, paranoia, suicidal or homicidal ideation    Past Medical History  Diagnosis Date  . Chronic kidney disease     esrd  . Diabetes mellitus   . Hyperlipidemia   .  Hypertension   . Renal disorder   . Dialysis care     tues, thurs, sat  . 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 Carotid endarterectomy 2002    left  . Btl   . Rectal fissurectomy   . Av fistula placement 10/30/2010    right brachiocephalic   . Fistulogram     Medications:  HOME MEDS: Prior to Admission medications   Medication Sig Start Date End Date Taking? Authorizing Provider  allopurinol (ZYLOPRIM) 100 MG tablet Take 100 mg by mouth daily.     Yes Historical Provider, MD  amLODipine (NORVASC) 5 MG tablet Take 10 mg by mouth daily. 07/13/11  Yes [name], MD  calcium acetate (PHOSLO) 667 MG capsule Take 667 mg by mouth 4 (four) times daily.    Yes Historical Provider, MD  Coenzyme Q10 150 MG CAPS Take 300 mg by mouth daily. Hold while in hospital   Yes Historical Provider, MD  docusate sodium (COLACE) 100 MG capsule Take 200 mg by mouth at bedtime.     Yes Historical Provider, MD  hydrALAZINE (APRESOLINE) 25 MG tablet Take 50 mg by mouth 3 (three) times daily.  07/18/11  Yes [name], MD  l-methylfolate-B6-B12 (METANX) 3-35-2 MG TABS Take 1 tablet by mouth 2 (two) times daily.    Yes Historical Provider, MD  losartan (COZAAR) 50 MG tablet Take 100 mg by mouth at bedtime.    Yes Historical Provider, MD  metoprolol succinate (TOPROL-XL) 50 MG 24 hr tablet Take 100 mg by mouth daily. Takes after dialysis (T, Th, Sat)   Yes Historical Provider, MD  multivitamin (RENA-VIT) TABS tablet Take 1 tablet by mouth daily.     Yes Historical Provider, MD  paricalcitol (ZEMPLAR) 5 MCG/ML injection Inject 0.2 mLs (1 mcg total) into the vein every Monday, Wednesday, and Friday with hemodialysis. 10/20/11  Yes Marinda Elk, MD  repaglinide (PRANDIN) 0.5 MG tablet Take 0.5 mg by mouth 3 (three) times daily before meals.    Yes Historical Provider, MD     Allergies:  Allergies  Allergen Reactions  . Codeine Other (See Comments)    Passes out    . Epinephrine Other (See Comments)    Tachycardia, diaphoresis, syncope  . Percocet (Oxycodone-Acetaminophen) Other (See Comments)    Passes  out  . Statins Other (See Comments)    Liver enzymes rise   PATIENT DOES NOT WANT TO BE GIVEN THIS TYPE OF MEDICATION PER THE [facility] OF [facility]    Social History:   reports that she has quit smoking. Her smoking use included Cigarettes. She has a 50 pack-year smoking history. She has never used smokeless tobacco. She reports that she does not drink alcohol or use illicit drugs.  Family History: Family History  Problem Relation Age of Onset  . COPD Mother   . Kidney disease Mother   . Heart disease Father   . Lymphoma Son      Physical Exam: Filed Vitals:   11/15/11 2200  BP: 191/60  Pulse: 81  Temp: 97.8 F (36.6 C)  TempSrc: Oral  Resp: 18  SpO2: 95%   Blood pressure 191/60, pulse 81, temperature 97.8 F (36.6 C), temperature source Oral, resp. rate 18, last menstrual period 09/27/2011, SpO2 95.00%.    GEN: Pleasant patient lying in the stretcher in no acute distress; cooperative with exam.  PSYCH: alert and oriented x4; does not appear anxious or depressed; affect is appropriate.  HEENT: Mucous membranes pink and anicteric; PERRLA; EOM intact; no cervical lymphadenopathy nor thyromegaly or carotid bruit; no JVD; There were no stridor. Neck is very supple.  Breasts:: Not examined  CHEST WALL: No tenderness  CHEST: Normal respiration, clear to auscultation bilaterally.  HEART: Regular rate and rhythm. There are no murmur, rub, or gallops.  BACK: No kyphosis or scoliosis; no CVA tenderness  ABDOMEN: soft and non-tender; no masses, no organomegaly, normal abdominal bowel sounds; no pannus; no intertriginous candida. There is no rebound and no distention.  Rectal Exam: Not done  EXTREMITIES: No bone or joint deformity; age-appropriate arthropathy of the hands and knees; no edema; no ulcerations. There is no  calf tenderness.  Genitalia: not examined  PULSES: 2+ and symmetric  SKIN: Normal hydration no rash or ulceration  CNS: Cranial nerves 2-12 grossly intact no focal lateralizing neurologic deficit. Speech is fluent; uvula elevated with phonation, facial symmetry and tongue midline. DTR are normal bilaterally, cerebella exam is intact, barbinski is negative and strengths are equaled bilaterally. No sensory loss.  Labs on Admission:  Basic Metabolic Panel:  Lab 11/13/11 1610 11/11/11 0548  NA 138 135  K 3.6 4.1  CL 99 95*  CO2 27 28  GLUCOSE 60* 156*  BUN 50* 62*  CREATININE 7.09* 8.62*  CALCIUM 9.8 9.9  MG -- --  PHOS 2.7 2.5   Liver Function Tests:  Lab 11/13/11 0730 11/11/11 0548  AST -- --  ALT -- --  ALKPHOS -- --  BILITOT -- --  PROT -- --  ALBUMIN 3.3* 3.2*   No results found for this basename: LIPASE:5,AMYLASE:5 in the last 168 hours No results found for this basename: AMMONIA:5 in the last 168 hours CBC:  Lab 11/13/11 0730 11/11/11  0548  WBC 6.5 5.9  NEUTROABS 3.0 3.0  HGB 9.5* 9.3*  HCT 30.4* 29.1*  MCV 77.2* 77.0*  PLT 141* 143*   Cardiac Enzymes: No results found for this basename: CKTOTAL:5,CKMB:5,CKMBINDEX:5,TROPONINI:5 in the last 168 hours  CBG:  Lab 11/16/11 0014 11/15/11 2155 11/13/11 1018 11/13/11 0707 11/13/11 0647  GLUCAP 235* 130* 148* 124* 65*     Radiological Exams on Admission: No results found.     Assessment/Plan Present on Admission:  .Anemia of chronic disease .ESRD (end stage renal disease) on dialysis .Diabetes mellitus .Tobacco abuse .HTN (hypertension), benign .Hyperlipidemia   PLAN:  Will admit her for routine dialysis.  She offers no complaints today.  I have continued her home meds.  Will give her DM2 and renal diet.  She is full code and will be admitted to [facility] service.  Other plans as per orders.  Code Status: FULL Unk Lightning, MD. [facility] Pager [phone] 7pm to 7am.  11/16/2011, 1:57  AM

## 2011-11-16 NOTE — ED Provider Notes (Signed)
History     CSN: [id number]  Arrival date & time 11/15/11  2149   First MD Initiated Contact with Patient 11/15/11 2240      No chief complaint on file.   (Consider location/radiation/quality/duration/timing/severity/associated sxs/prior treatment) HPI History provided by pt.   Pt presents for dialysis.  Most recently dialyzed on Saturday (three days ago).  She has no complaints currently.  Denies cough, CP, SOB, LE edema and recent illness.    Past Medical History  Diagnosis Date  . Chronic kidney disease     esrd  . Diabetes mellitus   . Hyperlipidemia   . Hypertension   . Renal disorder   . Dialysis care     tues, thurs, sat  . 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 Carotid endarterectomy 2002    left  . Btl   . Rectal fissurectomy   . Av fistula placement 10/30/2010    right brachiocephalic   . Fistulogram     Family History  Problem Relation Age of Onset  . COPD Mother   . Kidney disease Mother   . Heart disease Father   . Lymphoma Son     History  Substance Use Topics  . Smoking status: Former Smoker -- 1.0 packs/day for 50 years    Types: Cigarettes  . Smokeless tobacco: Never Used  . Alcohol Use: No    OB History    Grav Para Term Preterm Abortions TAB SAB Ect Mult Living                  Review of Systems  All other systems reviewed and are negative.    Allergies  Codeine; Epinephrine; Percocet; and Statins  Home Medications   Current Outpatient Rx  Name Route Sig Dispense Refill  . ALLOPURINOL 100 MG PO TABS Oral Take 100 mg by mouth daily.      Marland Kitchen AMLODIPINE BESYLATE 5 MG PO TABS Oral Take 10 mg by mouth daily.    Marland Kitchen CALCIUM ACETATE 667 MG PO CAPS Oral Take 667 mg by mouth 4 (four) times daily.     Marland Kitchen COENZYME Q10 150 MG PO CAPS Oral Take 300 mg by mouth daily. Hold while in hospital    . DOCUSATE SODIUM 100 MG PO CAPS Oral Take 200 mg by mouth at bedtime.      Marland Kitchen HYDRALAZINE HCL 25 MG PO TABS Oral  Take 50 mg by mouth 3 (three) times daily.     . L-METHYLFOLATE-B6-B12 3-35-2 MG PO TABS Oral Take 1 tablet by mouth 2 (two) times daily.     Marland Kitchen LOSARTAN POTASSIUM 50 MG PO TABS Oral Take 100 mg by mouth at bedtime.     Marland Kitchen METOPROLOL SUCCINATE ER 50 MG PO TB24 Oral Take 100 mg by mouth daily. Takes after dialysis (T, Th, Sat)    . RENA-VITE PO TABS Oral Take 1 tablet by mouth daily.      Marland Kitchen PARICALCITOL 5 MCG/ML IV SOLN Intravenous Inject 0.2 mLs (1 mcg total) into the vein every Monday, Wednesday, and Friday with hemodialysis. 1 mL   . REPAGLINIDE 0.5 MG PO TABS Oral Take 0.5 mg by mouth 3 (three) times daily before meals.       BP 191/60  Pulse 81  Temp 97.8 F (36.6 C) (Oral)  Resp 18  SpO2 95%  LMP 09/27/2011  Physical Exam  Nursing note and vitals reviewed. Constitutional: She is oriented to  person, place, and time. She appears well-developed and well-nourished. No distress.  HENT:  Head: Normocephalic and atraumatic.  Eyes:       Normal appearance  Neck: Normal range of motion.  Cardiovascular: Normal rate, regular rhythm and intact distal pulses.   Pulmonary/Chest: Effort normal and breath sounds normal. No respiratory distress. She has no rales.  Musculoskeletal: Normal range of motion.       No LE edema  Neurological: She is alert and oriented to person, place, and time.  Skin: Skin is warm and dry. No rash noted.  Psychiatric: She has a normal mood and affect. Her behavior is normal.    ED Course  Procedures (including critical care time)  Labs Reviewed  GLUCOSE, CAPILLARY - Abnormal; Notable for the following:    Glucose-Capillary 130 (*)     All other components within normal limits  GLUCOSE, CAPILLARY - Abnormal; Notable for the following:    Glucose-Capillary 235 (*)     All other components within normal limits   No results found.   1. Chronic diastolic CHF (congestive heart failure)   2. Diabetes mellitus with end stage renal disease   3. End stage renal  disease       MDM  Pt presents for regular hemodialysis.  No other concerns/complaints.  No signs of fluid overload on exam.  Has been evaluated by [name].  Admitted [facility].          Otilio Miu, [place] 11/16/11 [phone]

## 2011-11-16 NOTE — Discharge Summary (Signed)
  HPI:  [name] presented to [facility] ED for routine dialysis. She receives dialysis here 3 times per week. Her dialysis orders were written by Dr. Jeri Cos, her nephrologist. She has had no issues during dialysis tonight except for labile BP, which is common for her. She has been known to take her own meds during dialysis as well.   S: Feels well. Denies CP, SOB. No problems during dialysis. States will return on Thurs for dialysis.  O: Appears well. Alert and oriented. VS reviewed and stable. RRR. Lungs CTA. MOE x 4. No focal neuro deficits noted.   A/P: 1. ESRD on HD-f/up with PCP and Dr. Bascom [name] as ordered. Return here as scheduled for HD. Go to ED for any concerning sx and/or elevated BP.  Resume home meds.

## 2011-11-16 NOTE — ED Notes (Signed)
HD catheter noted to right chest.  Two ports capped and clamped.  AV graft RUE with positive thrill and bruit.  Edema noted to R hand.  Palpable right radial pulse .  Awaiting HD.  Denies pain at this time

## 2011-11-16 NOTE — ED Provider Notes (Signed)
Medical screening examination/treatment/procedure(s) were performed by non-physician practitioner and as supervising physician I was immediately available for consultation/collaboration.  Cheri Guppy, MD 11/16/11 [phone]

## 2011-11-18 ENCOUNTER — Encounter (HOSPITAL_COMMUNITY): Payer: Self-pay | Admitting: Emergency Medicine

## 2011-11-18 ENCOUNTER — Observation Stay (HOSPITAL_COMMUNITY)
Admission: EM | Admit: 2011-11-18 | Discharge: 2011-11-18 | DRG: 682 | Discharge status: Against Medical Advice | Payer: Medicare HMO | Attending: Internal Medicine | Admitting: Internal Medicine

## 2011-11-18 ENCOUNTER — Inpatient Hospital Stay (HOSPITAL_COMMUNITY): Admit: 2011-11-18 | Discharge: 2011-11-18 | Disposition: A | Discharge status: Home / Self Care | Payer: Medicare HMO

## 2011-11-18 DIAGNOSIS — I120 Hypertensive chronic kidney disease with stage 5 chronic kidney disease or end stage renal disease: Principal | ICD-10-CM | POA: Diagnosis present

## 2011-11-18 DIAGNOSIS — E785 Hyperlipidemia, unspecified: Secondary | ICD-10-CM | POA: Diagnosis present

## 2011-11-18 DIAGNOSIS — E1129 Type 2 diabetes mellitus with other diabetic kidney complication: Secondary | ICD-10-CM

## 2011-11-18 DIAGNOSIS — N2581 Secondary hyperparathyroidism of renal origin: Secondary | ICD-10-CM | POA: Diagnosis present

## 2011-11-18 DIAGNOSIS — Z87891 Personal history of nicotine dependence: Secondary | ICD-10-CM

## 2011-11-18 DIAGNOSIS — N058 Unspecified nephritic syndrome with other morphologic changes: Secondary | ICD-10-CM

## 2011-11-18 DIAGNOSIS — Z79899 Other long term (current) drug therapy: Secondary | ICD-10-CM

## 2011-11-18 DIAGNOSIS — I10 Essential (primary) hypertension: Secondary | ICD-10-CM | POA: Diagnosis present

## 2011-11-18 DIAGNOSIS — N186 End stage renal disease: Secondary | ICD-10-CM | POA: Diagnosis present

## 2011-11-18 DIAGNOSIS — E119 Type 2 diabetes mellitus without complications: Secondary | ICD-10-CM | POA: Diagnosis present

## 2011-11-18 DIAGNOSIS — E1122 Type 2 diabetes mellitus with diabetic chronic kidney disease: Secondary | ICD-10-CM

## 2011-11-18 DIAGNOSIS — D631 Anemia in chronic kidney disease: Secondary | ICD-10-CM | POA: Diagnosis present

## 2011-11-18 DIAGNOSIS — Z992 Dependence on renal dialysis: Secondary | ICD-10-CM

## 2011-11-18 DIAGNOSIS — Z9119 Patient's noncompliance with other medical treatment and regimen: Secondary | ICD-10-CM

## 2011-11-18 DIAGNOSIS — Z91199 Patient's noncompliance with other medical treatment and regimen due to unspecified reason: Secondary | ICD-10-CM

## 2011-11-18 DIAGNOSIS — M109 Gout, unspecified: Secondary | ICD-10-CM | POA: Diagnosis present

## 2011-11-18 DIAGNOSIS — D638 Anemia in other chronic diseases classified elsewhere: Secondary | ICD-10-CM

## 2011-11-18 LAB — CBC WITH DIFFERENTIAL/PLATELET
Basophils Absolute: 0.1 10*3/uL (ref 0.0–0.1)
Lymphocytes Relative: 30 % (ref 12–46)
Lymphs Abs: 2.2 10*3/uL (ref 0.7–4.0)
Monocytes Relative: 13 % — ABNORMAL HIGH (ref 3–12)
Neutrophils Relative %: 54 % (ref 43–77)
Platelets: 163 10*3/uL (ref 150–400)
RDW: 22.5 % — ABNORMAL HIGH (ref 11.5–15.5)
WBC: 7.2 10*3/uL (ref 4.0–10.5)

## 2011-11-18 LAB — RENAL FUNCTION PANEL
Albumin: 3.3 g/dL — ABNORMAL LOW (ref 3.5–5.2)
Chloride: 100 mEq/L (ref 96–112)
GFR calc Af Amer: 5 mL/min — ABNORMAL LOW (ref >90)
GFR calc non Af Amer: 4 mL/min — ABNORMAL LOW (ref >90)
Phosphorus: 3.5 mg/dL (ref 2.3–4.6)
Potassium: 4 mEq/L (ref 3.5–5.1)
Sodium: 139 mEq/L (ref 135–145)

## 2011-11-18 LAB — GLUCOSE, CAPILLARY
Glucose-Capillary: 116 mg/dL — ABNORMAL HIGH (ref 70–99)
Glucose-Capillary: 97 mg/dL (ref 70–99)
Glucose-Capillary: 83 mg/dL (ref 70–99)

## 2011-11-18 MED ORDER — SODIUM CHLORIDE 0.9 % IV SOLN: 100.0000 mL | INTRAVENOUS | Status: DC | PRN

## 2011-11-18 MED ORDER — HYDRALAZINE HCL 20 MG/ML IJ SOLN
10.0000 mg | Freq: Once | INTRAMUSCULAR | Status: AC
  Administered 2011-11-18: 10 mg via INTRAVENOUS
  Filled 2011-11-18: qty 0.5

## 2011-11-18 MED ORDER — HEPARIN SODIUM (PORCINE) 1000 UNIT/ML DIALYSIS
1000.0000 [IU] | INTRAMUSCULAR | Status: DC | PRN
  Filled 2011-11-18: qty 1

## 2011-11-18 MED ORDER — PARICALCITOL 5 MCG/ML IV SOLN
INTRAVENOUS | Status: AC
  Administered 2011-11-18: 1 ug via INTRAVENOUS
  Filled 2011-11-18: qty 1

## 2011-11-18 MED ORDER — ALTEPLASE 2 MG IJ SOLR
2.0000 mg | Freq: Once | INTRAMUSCULAR | Status: DC | PRN
  Filled 2011-11-18: qty 2

## 2011-11-18 MED ORDER — PARICALCITOL 5 MCG/ML IV SOLN
1.0000 ug | Freq: Once | INTRAVENOUS | Status: AC
  Administered 2011-11-18: 1 ug via INTRAVENOUS
  Filled 2011-11-18: qty 0.2

## 2011-11-18 MED ORDER — NEPRO/CARBSTEADY PO LIQD
237.0000 mL | ORAL | Status: DC | PRN
  Filled 2011-11-18: qty 237

## 2011-11-18 MED ORDER — EPOETIN ALFA 20000 UNIT/ML IJ SOLN
20000.0000 [IU] | Freq: Once | INTRAMUSCULAR | Status: AC
  Administered 2011-11-18: 20000 [IU] via INTRAVENOUS
  Filled 2011-11-18: qty 1

## 2011-11-18 MED ORDER — HEPARIN SODIUM (PORCINE) 1000 UNIT/ML DIALYSIS
1100.0000 [IU] | INTRAMUSCULAR | Status: DC | PRN
  Administered 2011-11-18: 1100 [IU] via INTRAVENOUS_CENTRAL
  Filled 2011-11-18: qty 2

## 2011-11-18 NOTE — H&P (Signed)
[facility] History and Physical  Gennifer Potenza ZOX:[id number] DOB: Oct 05, 1945 DOA: 11/18/2011   PCP: [name], MD     HPI:  66 year-old female with known history of ESRD on hemodialysis, diabetes mellitus type 2, hypertension has come for her regular dialysis. Patient denies any chest pain or shortness of breath and is not in any acute distress.   Review of Systems:  Constitutional:  No weight loss, night sweats, Fevers, chills, fatigue.  HEENT:  No headaches, Difficulty swallowing,Tooth/dental problems,Sore throat,  No sneezing, itching, ear ache, nasal congestion, post nasal drip,  Cardio-vascular:  No chest pain, Orthopnea, PND, swelling in lower extremities, anasarca, dizziness, palpitations  GI:  No heartburn, indigestion, abdominal pain, nausea, vomiting, diarrhea, change in bowel habits, loss of appetite  Resp:  No shortness of breath with exertion or at rest. No excess mucus, no productive cough, No non-productive cough, No coughing up of blood.No change in color of mucus.No wheezing.No chest wall deformity  Skin:  no rash or lesions.  GU:  no dysuria, change in color of urine, no urgency or frequency. No flank pain.  Musculoskeletal:  No joint pain or swelling. No decreased range of motion. No back pain.  Psych:  No change in mood or affect. No depression or anxiety. No memory loss.    Past Medical History  Diagnosis Date  . Chronic kidney disease     esrd  . Diabetes mellitus   . Hyperlipidemia   . Hypertension   . Renal disorder   . Dialysis care     tues, thurs, sat  . Gout due to renal impairment    Past Surgical History  Procedure Date  . Carotid endarterectomy 2002    left  . Btl   . Rectal fissurectomy   . Av fistula placement 10/30/2010    right brachiocephalic   . Fistulogram    Social History:  reports that she has quit smoking. Her smoking use included Cigarettes. She has a 50 pack-year smoking history. She has never  used smokeless tobacco. She reports that she does not drink alcohol or use illicit drugs.  Allergies  Allergen Reactions  . Codeine Other (See Comments)    Passes out  . Epinephrine Other (See Comments)    Tachycardia, diaphoresis, syncope  . Percocet (Oxycodone-Acetaminophen) Other (See Comments)    Passes  out  . Statins Other (See Comments)    Liver enzymes rise   PATIENT DOES NOT WANT TO BE GIVEN THIS TYPE OF MEDICATION PER THE [facility] OF [facility]ANS    Family History  Problem Relation Age of Onset  . COPD Mother   . Kidney disease Mother   . Heart disease Father   . Lymphoma Son     Prior to Admission medications   Medication Sig Start Date End Date Taking? Authorizing Provider  allopurinol (ZYLOPRIM) 100 MG tablet Take 100 mg by mouth daily.     Yes Historical Provider, MD  amLODipine (NORVASC) 5 MG tablet Take 10 mg by mouth daily. 07/13/11  Yes [name], MD  calcium acetate (PHOSLO) 667 MG capsule Take 667 mg by mouth 4 (four) times daily.    Yes Historical Provider, MD  Coenzyme Q10 150 MG CAPS Take 300 mg by mouth daily. Hold while in hospital   Yes Historical Provider, MD  docusate sodium (COLACE) 100 MG capsule Take 200 mg by mouth at bedtime.     Yes Historical Provider, MD  hydrALAZINE (APRESOLINE) 25 MG tablet  Take 50 mg by mouth 3 (three) times daily.  07/18/11  Yes [name], MD  l-methylfolate-B6-B12 (METANX) 3-35-2 MG TABS Take 1 tablet by mouth 2 (two) times daily.    Yes Historical Provider, MD  losartan (COZAAR) 50 MG tablet Take 100 mg by mouth at bedtime.    Yes Historical Provider, MD  metoprolol succinate (TOPROL-XL) 50 MG 24 hr tablet Take 100 mg by mouth daily. Takes after dialysis (T, Th, Sat)   Yes Historical Provider, MD  multivitamin (RENA-VIT) TABS tablet Take 1 tablet by mouth daily.     Yes Historical Provider, MD  repaglinide (PRANDIN) 0.5 MG tablet Take 0.5 mg by mouth 3 (three) times daily before meals.    Yes  Historical Provider, MD   Physical Exam: Filed Vitals:   11/18/11 0539  BP: 199/66  Pulse: 88  Temp: 97.8 F (36.6 C)  TempSrc: Oral  Resp: 14  SpO2: 98%   BP 199/66  Pulse 88  Temp 97.8 F (36.6 C) (Oral)  Resp 14  SpO2 98%  LMP 09/27/2011  General Appearance:    Alert, cooperative, no distress, appears stated age  Head:    Normocephalic, without obvious abnormality, atraumatic  Eyes:    PERRL,        Throat:  poor oral hygiene  Neck:   Supple, symmetrical, trachea midline, no adenopathy;    thyroid:  no enlargement/tenderness/nodules; + JVD  Back:     Symmetric, no curvature, ROM normal, no CVA tenderness  Lungs:     Clear to auscultation bilaterally, respirations unlabored, good air movement      Heart:    Regular rate and rhythm, S1 and S2 normal, no murmur, rub   or gallop     Abdomen:     Soft, non-tender, bowel sounds active all four quadrants,    no masses, no organomegaly        Extremities:   Swelling of right upper extremity  Pulses:   2+ and symmetric all extremities  Skin:   Skin color, texture, turgor normal, no rashes or lesions  Lymph nodes:   Cervical, supraclavicular, and axillary nodes normal  Neurologic:   CNII-XII intact, normal strength, sensation and reflexes    throughout    Labs on Admission:  Basic Metabolic Panel:  Lab 11/16/11 1610 11/13/11 0730  NA 139 138  K 4.0 3.6  CL 99 99  CO2 25 27  GLUCOSE 187* 60*  BUN 71* 50*  CREATININE 8.42* 7.09*  CALCIUM 10.0 9.8  MG -- --  PHOS 2.6 2.7   Liver Function Tests:  Lab 11/16/11 0237 11/13/11 0730  AST -- --  ALT -- --  ALKPHOS -- --  BILITOT -- --  PROT -- --  ALBUMIN 3.3* 3.3*   No results found for this basename: LIPASE:5,AMYLASE:5 in the last 168 hours No results found for this basename: AMMONIA:5 in the last 168 hours CBC:  Lab 11/16/11 0237 11/13/11 0730  WBC 7.2 6.5  NEUTROABS 4.0 3.0  HGB 9.3* 9.5*  HCT 29.9* 30.4*  MCV 76.9* 77.2*  PLT 176 141*   Cardiac  Enzymes: No results found for this basename: CKTOTAL:5,CKMB:5,CKMBINDEX:5,TROPONINI:5 in the last 168 hours BNP: No components found with this basename: POCBNP:5 CBG:  Lab 11/18/11 0542 11/16/11 0336 11/16/11 0014 11/15/11 2155 11/13/11 1018  GLUCAP 101* 92 235* 130* 148*    Radiological Exams on Admission: No results found.  EKG: Independently reviewed. none  Assessment/Plan: Principal Problem: Mild Dyspnea: -most likely  2/2 to non compliance with fluid restriction, no crackles appreciated on physical exam. denies CP. Will proceed with HD and re-evaluate.  ESRD on hemodialysis: on hemodialysis - dialysis per nephrologist.  Hyperparathyroidism, secondary renal Per renal, continue home meds.  Diabetes mellitus Currently on no medications. Check a hbgA1c.  HTN (hypertension), benign High, most likely 2/2 to vol overload. Resume home meds.   Anemia of renal disease -refused labs. Check a CBC if she would allow.  Time spend: 35 min Code Status: full Family Communication: patient Disposition Plan: home  Marinda Elk, MD  [facility] Pager [phone]  If 7PM-7AM, please contact night-coverage [email or web address] Password [email or web address] 11/18/2011, 7:23 AM

## 2011-11-18 NOTE — ED Notes (Signed)
Report given to Dialysis RN.

## 2011-11-18 NOTE — ED Provider Notes (Signed)
History     CSN: [id number]  Arrival date & time 11/18/11  0532   First MD Initiated Contact with Patient 11/18/11 [phone]      No chief complaint on file.   (Consider location/radiation/quality/duration/timing/severity/associated sxs/prior treatment) HPI HX per PT, presents here for routine dialysis, had last dialysis here 2 days ago, no CP , min SOB, no swelling. No recent illness, is requesting to be taken upstairs ASAP for dialysis.  She has no complaints otherwise Past Medical History  Diagnosis Date  . Chronic kidney disease     esrd  . Diabetes mellitus   . Hyperlipidemia   . Hypertension   . Renal disorder   . Dialysis care     tues, thurs, sat  . 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 Carotid endarterectomy 2002    left  . Btl   . Rectal fissurectomy   . Av fistula placement 10/30/2010    right brachiocephalic   . Fistulogram     Family History  Problem Relation Age of Onset  . COPD Mother   . Kidney disease Mother   . Heart disease Father   . Lymphoma Son     History  Substance Use Topics  . Smoking status: Former Smoker -- 1.0 packs/day for 50 years    Types: Cigarettes  . Smokeless tobacco: Never Used  . Alcohol Use: No    OB History    Grav Para Term Preterm Abortions TAB SAB Ect Mult Living                  Review of Systems  Constitutional: Negative for fever and chills.  HENT: Negative for neck pain and neck stiffness.   Eyes: Negative for pain.  Respiratory: Negative for cough and chest tightness.   Cardiovascular: Negative for chest pain.  Gastrointestinal: Negative for abdominal pain.  Genitourinary: Negative for dysuria.  Musculoskeletal: Negative for back pain.  Skin: Negative for rash.  Neurological: Negative for headaches.  All other systems reviewed and are negative.    Allergies  Codeine; Epinephrine; Percocet; and Statins  Home Medications   Current Outpatient Rx  Name Route Sig  Dispense Refill  . ALLOPURINOL 100 MG PO TABS Oral Take 100 mg by mouth daily.      Marland Kitchen AMLODIPINE BESYLATE 5 MG PO TABS Oral Take 10 mg by mouth daily.    Marland Kitchen CALCIUM ACETATE 667 MG PO CAPS Oral Take 667 mg by mouth 4 (four) times daily.     Marland Kitchen COENZYME Q10 150 MG PO CAPS Oral Take 300 mg by mouth daily. Hold while in hospital    . DOCUSATE SODIUM 100 MG PO CAPS Oral Take 200 mg by mouth at bedtime.      Marland Kitchen HYDRALAZINE HCL 25 MG PO TABS Oral Take 50 mg by mouth 3 (three) times daily.     . L-METHYLFOLATE-B6-B12 3-35-2 MG PO TABS Oral Take 1 tablet by mouth 2 (two) times daily.     Marland Kitchen LOSARTAN POTASSIUM 50 MG PO TABS Oral Take 100 mg by mouth at bedtime.     Marland Kitchen METOPROLOL SUCCINATE ER 50 MG PO TB24 Oral Take 100 mg by mouth daily. Takes after dialysis (T, Th, Sat)    . RENA-VITE PO TABS Oral Take 1 tablet by mouth daily.      Marland Kitchen REPAGLINIDE 0.5 MG PO TABS Oral Take 0.5 mg by mouth 3 (three) times daily before meals.  BP 199/66  Pulse 88  Temp 97.8 F (36.6 C) (Oral)  Resp 14  SpO2 98%  LMP 09/27/2011  Physical Exam  Constitutional: She is oriented to person, place, and time. She appears well-developed and well-nourished.  HENT:  Head: Normocephalic and atraumatic.  Eyes: Conjunctivae and EOM are normal. Pupils are equal, round, and reactive to light.  Neck: Trachea normal and full passive range of motion without pain. Neck supple. No thyromegaly present.       No meningismus  Cardiovascular: Normal rate, regular rhythm, S1 normal, S2 normal, intact distal pulses and normal pulses.     No systolic murmur is present   No diastolic murmur is present  Pulses:      Radial pulses are 2+ on the right side, and 2+ on the left side.  Pulmonary/Chest: Effort normal and breath sounds normal. She has no wheezes. She has no rhonchi. She has no rales. She exhibits no tenderness.  Abdominal: Soft. Normal appearance and bowel sounds are normal. There is no tenderness. There is no CVA tenderness and  negative Murphy's sign.  Musculoskeletal: Normal range of motion.       BLE:s Calves nontender, no cords or erythema, negative Homans sign  Neurological: She is alert and oriented to person, place, and time. She has normal strength and normal reflexes. No cranial nerve deficit or sensory deficit. She displays a negative Romberg sign. GCS eye subscore is 4. GCS verbal subscore is 5. GCS motor subscore is 6.       Normal Gait  Skin: Skin is warm and dry. No rash noted. She is not diaphoretic. No cyanosis. Nails show no clubbing.  Psychiatric: She has a normal mood and affect. Her speech is normal and behavior is normal.       Cooperative and appropriate    ED Course  Procedures (including critical care time)  Results for orders placed during the hospital encounter of 11/18/11  GLUCOSE, CAPILLARY      Component Value Range   Glucose-Capillary 101 (*) 70 - 99 mg/dL   Comment 1 Notify RN     6:43 AM d/w Dr Kara Pacer, will have flow manager page oncoming team for admission for dilaysis.  0715 s/w [facility] - will admit  DX dyspnea and ESRD MDM  VS and nursing notes reviewed, standing orders noted. Plan MED admit/ dialysis        Sunnie Nielsen, MD 11/18/11 2324

## 2011-11-18 NOTE — ED Notes (Signed)
PT. IS HERE FOR ROUTINE HEMODIALYSIS . NO OTHER COMPLAINT OR CONCERN. AMBULATORY /RESPIRATIONS UNLABORED.

## 2011-11-18 NOTE — ED Notes (Signed)
DIALYSIS UNIT NOTIFIED THAT PT. IS HERE FOR HER HEMODIALYSIS.

## 2011-11-19 ENCOUNTER — Inpatient Hospital Stay (HOSPITAL_COMMUNITY): Payer: Medicare HMO

## 2011-11-19 ENCOUNTER — Encounter (HOSPITAL_COMMUNITY): Payer: Self-pay | Admitting: Emergency Medicine

## 2011-11-19 ENCOUNTER — Observation Stay (HOSPITAL_COMMUNITY)
Admission: EM | Admit: 2011-11-19 | Discharge: 2011-11-20 | Disposition: A | Discharge status: Home / Self Care | Payer: Medicare HMO | Attending: Internal Medicine | Admitting: Internal Medicine

## 2011-11-19 DIAGNOSIS — N186 End stage renal disease: Secondary | ICD-10-CM

## 2011-11-19 DIAGNOSIS — I120 Hypertensive chronic kidney disease with stage 5 chronic kidney disease or end stage renal disease: Secondary | ICD-10-CM | POA: Insufficient documentation

## 2011-11-19 DIAGNOSIS — D638 Anemia in other chronic diseases classified elsewhere: Secondary | ICD-10-CM

## 2011-11-19 DIAGNOSIS — E119 Type 2 diabetes mellitus without complications: Secondary | ICD-10-CM | POA: Insufficient documentation

## 2011-11-19 DIAGNOSIS — Z8639 Personal history of other endocrine, nutritional and metabolic disease: Secondary | ICD-10-CM

## 2011-11-19 DIAGNOSIS — I10 Essential (primary) hypertension: Secondary | ICD-10-CM | POA: Diagnosis present

## 2011-11-19 DIAGNOSIS — Z862 Personal history of diseases of the blood and blood-forming organs and certain disorders involving the immune mechanism: Secondary | ICD-10-CM

## 2011-11-19 DIAGNOSIS — Z8739 Personal history of other diseases of the musculoskeletal system and connective tissue: Secondary | ICD-10-CM

## 2011-11-19 DIAGNOSIS — Z992 Dependence on renal dialysis: Secondary | ICD-10-CM | POA: Insufficient documentation

## 2011-11-19 MED ORDER — HYDRALAZINE HCL 50 MG PO TABS
50.0000 mg | ORAL_TABLET | Freq: Three times a day (TID) | ORAL | Status: DC
  Filled 2011-11-19 (×3): qty 1

## 2011-11-19 MED ORDER — RENA-VITE PO TABS
1.0000 | ORAL_TABLET | Freq: Every day | ORAL | Status: DC
  Filled 2011-11-19: qty 1

## 2011-11-19 MED ORDER — METOPROLOL SUCCINATE ER 100 MG PO TB24
100.0000 mg | ORAL_TABLET | Freq: Every day | ORAL | Status: DC
  Filled 2011-11-19: qty 1

## 2011-11-19 MED ORDER — AMLODIPINE BESYLATE 10 MG PO TABS
10.0000 mg | ORAL_TABLET | Freq: Every day | ORAL | Status: DC
  Filled 2011-11-19: qty 1

## 2011-11-19 MED ORDER — CALCIUM ACETATE 667 MG PO CAPS
667.0000 mg | ORAL_CAPSULE | Freq: Three times a day (TID) | ORAL | Status: DC
  Filled 2011-11-19 (×5): qty 1

## 2011-11-19 MED ORDER — DOCUSATE SODIUM 100 MG PO CAPS: 200.0000 mg | ORAL_CAPSULE | Freq: Every day | ORAL | Status: DC

## 2011-11-19 MED ORDER — PARICALCITOL 5 MCG/ML IV SOLN
1.0000 ug | Freq: Once | INTRAVENOUS | Status: AC
  Administered 2011-11-20: 1 ug via INTRAVENOUS

## 2011-11-19 MED ORDER — HEPARIN SODIUM (PORCINE) 1000 UNIT/ML DIALYSIS
20.0000 [IU]/kg | INTRAMUSCULAR | Status: DC | PRN
  Administered 2011-11-20: 1100 [IU] via INTRAVENOUS_CENTRAL
  Filled 2011-11-19: qty 2

## 2011-11-19 MED ORDER — LOSARTAN POTASSIUM 50 MG PO TABS
100.0000 mg | ORAL_TABLET | Freq: Every day | ORAL | Status: DC
  Filled 2011-11-19: qty 2

## 2011-11-19 MED ORDER — ALLOPURINOL 100 MG PO TABS
100.0000 mg | ORAL_TABLET | Freq: Every day | ORAL | Status: DC
  Filled 2011-11-19: qty 1

## 2011-11-19 MED ORDER — REPAGLINIDE 0.5 MG PO TABS
0.5000 mg | ORAL_TABLET | Freq: Three times a day (TID) | ORAL | Status: DC
  Filled 2011-11-19 (×4): qty 1

## 2011-11-19 MED ORDER — EPOETIN ALFA 20000 UNIT/ML IJ SOLN
20000.0000 [IU] | Freq: Once | INTRAMUSCULAR | Status: AC
  Administered 2011-11-20: 20000 [IU] via INTRAVENOUS
  Filled 2011-11-19: qty 1

## 2011-11-19 MED ORDER — L-METHYLFOLATE-B6-B12 3-35-2 MG PO TABS
1.0000 | ORAL_TABLET | Freq: Two times a day (BID) | ORAL | Status: DC
  Filled 2011-11-19 (×2): qty 1

## 2011-11-19 NOTE — ED Notes (Signed)
CBG 116  

## 2011-11-19 NOTE — Progress Notes (Signed)
Received telephone HD orders from Dr. Bascom Levels, awaiting admission

## 2011-11-19 NOTE — ED Notes (Signed)
Pt here for dialysis. Denies any complaints.

## 2011-11-20 ENCOUNTER — Observation Stay (HOSPITAL_COMMUNITY): Payer: Medicare HMO

## 2011-11-20 LAB — CBC WITH DIFFERENTIAL/PLATELET
Basophils Absolute: 0 10*3/uL (ref 0.0–0.1)
Eosinophils Absolute: 0.2 10*3/uL (ref 0.0–0.7)
Eosinophils Relative: 2 % (ref 0–5)
HCT: 30.8 % — ABNORMAL LOW (ref 36.0–46.0)
Lymphocytes Relative: 30 % (ref 12–46)
MCH: 23.6 pg — ABNORMAL LOW (ref 26.0–34.0)
MCHC: 30.8 g/dL (ref 30.0–36.0)
MCV: 76.4 fL — ABNORMAL LOW (ref 78.0–100.0)
Monocytes Absolute: 0.9 10*3/uL (ref 0.1–1.0)
RDW: 22.4 % — ABNORMAL HIGH (ref 11.5–15.5)
WBC: 7.5 10*3/uL (ref 4.0–10.5)

## 2011-11-20 LAB — RENAL FUNCTION PANEL
Albumin: 3.3 g/dL — ABNORMAL LOW (ref 3.5–5.2)
BUN: 43 mg/dL — ABNORMAL HIGH (ref 6–23)
Calcium: 10.1 mg/dL (ref 8.4–10.5)
Chloride: 99 mEq/L (ref 96–112)
Creatinine, Ser: 7 mg/dL — ABNORMAL HIGH (ref 0.50–1.10)

## 2011-11-20 MED ORDER — PARICALCITOL 5 MCG/ML IV SOLN
INTRAVENOUS | Status: AC
  Administered 2011-11-20: 1 ug via INTRAVENOUS
  Filled 2011-11-20: qty 1

## 2011-11-20 MED ORDER — HYDRALAZINE HCL 50 MG PO TABS
50.0000 mg | ORAL_TABLET | Freq: Once | ORAL | Status: AC
  Administered 2011-11-20: 50 mg via ORAL
  Filled 2011-11-20: qty 1

## 2011-11-20 NOTE — Progress Notes (Signed)
Hemodialysis-See flowsheet. Unable to pull any fluid d/t severe cramps. Pt came in at 0.1kg above EDW. Pt states she feels "fine" post hd. Vitals stable. Will discharge through ED via wheelchair.

## 2011-11-20 NOTE — ED Notes (Signed)
Pt sitting in chair eating crackers and reading the paper.  No complaints voiced at this time.

## 2011-11-20 NOTE — ED Notes (Signed)
Dialysis unit called stating they were ready for pt.  Pt transported to dialysis via wheelchair

## 2011-11-20 NOTE — H&P (Signed)
[facility]                                                                                    Patient Demographics  Alexandra [name], is a 66 y.o. female  CSN: [id number]  MRN: [id number]  DOB - 1945-10-01  Admit Date - 11/19/2011  Outpatient Primary MD for the patient is [name], MD   With History of -  Past Medical History  Diagnosis Date  . Chronic kidney disease     esrd  . Diabetes mellitus   . Hyperlipidemia   . Hypertension   . Renal disorder   . Dialysis care     tues, thurs, sat  . Gout due to renal impairment       Past Surgical History  Procedure Date  . Carotid endarterectomy 2002    left  . Btl   . Rectal fissurectomy   . Av fistula placement 10/30/2010    right brachiocephalic   . Fistulogram     in for   Chief Complaint  Patient presents with  . Vascular Access Problem     HPI  Alexandra [name]  is a 66 y.o. female, with known history of end-stage renal disease, on hemodialysis, admitted [facility], hypertension, cancer TB to ED for hemodialysis, presents today for hemodialysis, denies any complaints of chest pain, shortness of breath, worsening lower extremity edema.    Review of Systems    In addition to the HPI above,  No Fever-chills, No Headache, No changes with Vision or hearing, No problems swallowing food or Liquids, No Chest pain, Cough or Shortness of Breath, No Abdominal pain, No Nausea or Vommitting, Bowel movements are regular, No Blood in stool or Urine, No dysuria, No new skin rashes or bruises, No new joints pains-aches,  No new weakness, tingling, numbness in any extremity, No recent weight gain or loss, No polyuria, polydypsia or polyphagia, No significant Mental Stressors.  A full 10 point Review of Systems was done, except as stated above, all other Review of Systems were negative.   Social History History  Substance Use Topics  . Smoking status: Former Smoker -- 1.0 packs/day  for 50 years    Types: Cigarettes  . Smokeless tobacco: Never Used  . Alcohol Use: No     Family History Family History  Problem Relation Age of Onset  . COPD Mother   . Kidney disease Mother   . Heart disease Father   . Lymphoma Son      Prior to Admission medications   Medication Sig Start Date End Date Taking? Authorizing Provider  allopurinol (ZYLOPRIM) 100 MG tablet Take 100 mg by mouth daily.     Yes Historical Provider, MD  amLODipine (NORVASC) 5 MG tablet Take 10 mg by mouth daily. 07/13/11  Yes [name], MD  calcium acetate (PHOSLO) 667 MG capsule Take 667 mg by mouth 4 (four) times daily.    Yes Historical Provider, MD  Coenzyme Q10 150 MG CAPS Take 300 mg by mouth daily. Hold while in hospital   Yes Historical Provider, MD  docusate sodium (COLACE) 100 MG capsule Take 200 mg by mouth at  bedtime.     Yes Historical Provider, MD  hydrALAZINE (APRESOLINE) 25 MG tablet Take 50 mg by mouth 3 (three) times daily.  07/18/11  Yes [name], MD  l-methylfolate-B6-B12 (METANX) 3-35-2 MG TABS Take 1 tablet by mouth 2 (two) times daily.    Yes Historical Provider, MD  losartan (COZAAR) 50 MG tablet Take 100 mg by mouth at bedtime.    Yes Historical Provider, MD  metoprolol succinate (TOPROL-XL) 50 MG 24 hr tablet Take 100 mg by mouth daily. Takes after dialysis (T, Th, Sat)   Yes Historical Provider, MD  multivitamin (RENA-VIT) TABS tablet Take 1 tablet by mouth daily.     Yes Historical Provider, MD  repaglinide (PRANDIN) 0.5 MG tablet Take 0.5 mg by mouth 3 (three) times daily before meals.    Yes Historical Provider, MD    Allergies  Allergen Reactions  . Codeine Other (See Comments)    Passes out  . Epinephrine Other (See Comments)    Tachycardia, diaphoresis, syncope  . Percocet (Oxycodone-Acetaminophen) Other (See Comments)    Passes  out  . Statins Other (See Comments)    Liver enzymes rise   PATIENT DOES NOT WANT TO BE GIVEN THIS TYPE OF MEDICATION PER THE  [facility]    Physical Exam  Vitals  Blood pressure 201/60, pulse 89, temperature 97.6 F (36.4 C), temperature source Oral, resp. rate 18, weight 57.153 kg (126 lb), last menstrual period 09/27/2011, SpO2 95.00%.   1. General elderly lying in bed in NAD,  2. Normal affect and insight, Not Suicidal or Homicidal, Awake Alert, Oriented X 3.  3. No F.N deficits, ALL C.Nerves Intact, Strength 5/5 all 4 extremities, Sensation intact all 4 extremities, Plantars down going.  4. Ears and Eyes appear Normal, Conjunctivae clear, PERRLA. Moist Oral Mucosa.  5. Supple Neck, No JVD, No cervical lymphadenopathy appriciated, No Carotid Bruits.  6. Symmetrical Chest wall movement, Good air movement bilaterally, CTAB.  7. RRR, No Gallops, Rubs or Murmurs, No Parasternal Heave.  8. Positive Bowel Sounds, Abdomen Soft, Non tender, No organomegaly appriciated,No rebound -guarding or rigidity.  9.  No Cyanosis, Normal Skin Turgor, No Skin Rash or Bruise.  10. Good muscle tone,  joints appear normal , no effusions, Normal ROM.  11. No Palpable Lymph Nodes in Neck or Axillae    Data Review  CBC  Lab 11/18/11 0805 11/16/11 0237 11/13/11 0730  WBC 7.2 7.2 6.5  HGB 9.2* 9.3* 9.5*  HCT 29.0* 29.9* 30.4*  PLT 163 176 141*  MCV 75.5* 76.9* 77.2*  MCH 24.0* 23.9* 24.1*  MCHC 31.7 31.1 31.3  RDW 22.5* 22.7* 22.8*  LYMPHSABS 2.2 2.3 2.4  MONOABS 0.9 0.6 0.8  EOSABS 0.1 0.2 0.3  BASOSABS 0.1 0.1 0.0  BANDABS -- -- --   ------------------------------------------------------------------------------------------------------------------  Chemistries   Lab 11/18/11 0805 11/16/11 0237 11/13/11 0730  NA 139 139 138  K 4.0 4.0 3.6  CL 100 99 99  CO2 27 25 27   GLUCOSE 134* 187* 60*  BUN 62* 71* 50*  CREATININE 8.49* 8.42* 7.09*  CALCIUM 9.7 10.0 9.8  MG -- -- --  AST -- -- --  ALT -- -- --  ALKPHOS -- -- --  BILITOT -- -- --    ------------------------------------------------------------------------------------------------------------------ CrCl is unknown because both a height and weight (above a minimum accepted value) are required for this calculation. ------------------------------------------------------------------------------------------------------------------ No results found for this basename: TSH,T4TOTAL,FREET3,T3FREE,THYROIDAB in the last 72 hours  Coagulation profile No results found for this basename: INR:5,PROTIME:5 in the last 168 hours ------------------------------------------------------------------------------------------------------------------- No results found for this basename: DDIMER:2 in the last 72 hours -------------------------------------------------------------------------------------------------------------------  Cardiac Enzymes No results found for this basename: CK:3,CKMB:3,TROPONINI:3,MYOGLOBIN:3 in the last 168 hours ------------------------------------------------------------------------------------------------------------------ No components found with this basename: POCBNP:3   ---------------------------------------------------------------------------------------------------------------  Urinalysis    Component Value Date/Time   COLORURINE YELLOW 08/20/2009 2114   APPEARANCEUR HAZY* 08/20/2009 2114   LABSPEC >1.030* 08/20/2009 2114   PHURINE 6.0 08/20/2009 2114   GLUCOSEU NEGATIVE 08/20/2009 2114   HGBUR SMALL* 08/20/2009 2114   BILIRUBINUR NEGATIVE 08/20/2009 2114   KETONESUR NEGATIVE 08/20/2009 2114   PROTEINUR >300* 08/20/2009 2114   UROBILINOGEN 0.2 08/20/2009 2114   NITRITE NEGATIVE 08/20/2009 2114   LEUKOCYTESUR NEGATIVE 08/20/2009 2114    ----------------------------------------------------------------------------------------------------------------   Imaging results:   Dg Forearm Right  11/11/2011  *RADIOLOGY REPORT*  Clinical Data: Swelling and arm pain  RIGHT FOREARM  - 2 VIEW  Comparison: None.  Findings: Postoperative changes are noted about the elbow joint. Diffuse soft tissue edema is noted.  No acute bony abnormality is seen.  IMPRESSION: Soft tissue swelling without acute bony abnormality.   Original Report Authenticated By: Phillips Odor, M.D.    Dg Hand 2 View Right  11/11/2011  *RADIOLOGY REPORT*  Clinical Data: Right hand swelling  RIGHT HAND - 2 VIEW  Comparison: None.  Findings:  Mild degenerate changes are noted in the interphalangeal joints.  No acute fracture or dislocation is seen.  Generalized soft tissue swelling is noted about the metacarpals.  IMPRESSION: Generalized soft tissue swelling without acute bony abnormality.   Original Report Authenticated By: Phillips Odor, M.D.       Assessment & Plan  Active Problems:  Anemia of chronic disease  ESRD (end stage renal disease) on dialysis  HTN (hypertension), benign  H/O: gout    1. end-stage renal disease, on hemodialysis, will receive hemodialysis today by by nephrology.  2. Hypertension, uncontrolled, we'll continue her home medication, as well it due to volume overload, will receive today.  3. Anemia of chronic disease, will check CBC  4., Gout, continue her medication   DVT Prophylaxis  SCDs   AM Labs Ordered, also please review Full Orders  Family Communication: Admission, patients condition and plan of care including tests being ordered have been discussed with the patientwho indicate understanding and agree with the plan and Code Status.  Code Status full  Disposition Plan: home  Time spent in minutes : Condition GUARDED  Randol Kern, Kenden Brandt M.D on 11/20/2011 at 12:00 AM   [facility] Office  [phone]

## 2011-11-20 NOTE — Progress Notes (Signed)
Hemodialysis-Pt arrived to unit in no distress

## 2011-11-20 NOTE — ED Notes (Signed)
Admitting MD in to assess pt for admission.  Admission orders obtained.

## 2011-11-21 ENCOUNTER — Emergency Department (HOSPITAL_COMMUNITY): Payer: Medicare HMO

## 2011-11-21 ENCOUNTER — Inpatient Hospital Stay (HOSPITAL_COMMUNITY): Payer: Medicare HMO

## 2011-11-21 ENCOUNTER — Observation Stay (HOSPITAL_COMMUNITY)
Admission: EM | Admit: 2011-11-21 | Discharge: 2011-11-21 | Disposition: A | Discharge status: Home / Self Care | Payer: Medicare HMO | Attending: Internal Medicine | Admitting: Internal Medicine

## 2011-11-21 DIAGNOSIS — E8770 Fluid overload, unspecified: Secondary | ICD-10-CM

## 2011-11-21 DIAGNOSIS — D72829 Elevated white blood cell count, unspecified: Secondary | ICD-10-CM

## 2011-11-21 DIAGNOSIS — D631 Anemia in chronic kidney disease: Secondary | ICD-10-CM | POA: Diagnosis present

## 2011-11-21 DIAGNOSIS — Z8739 Personal history of other diseases of the musculoskeletal system and connective tissue: Secondary | ICD-10-CM

## 2011-11-21 DIAGNOSIS — N186 End stage renal disease: Secondary | ICD-10-CM

## 2011-11-21 DIAGNOSIS — Z720 Tobacco use: Secondary | ICD-10-CM

## 2011-11-21 DIAGNOSIS — T82898A Other specified complication of vascular prosthetic devices, implants and grafts, initial encounter: Secondary | ICD-10-CM

## 2011-11-21 DIAGNOSIS — E1122 Type 2 diabetes mellitus with diabetic chronic kidney disease: Secondary | ICD-10-CM

## 2011-11-21 DIAGNOSIS — Z992 Dependence on renal dialysis: Principal | ICD-10-CM | POA: Insufficient documentation

## 2011-11-21 DIAGNOSIS — E1129 Type 2 diabetes mellitus with other diabetic kidney complication: Secondary | ICD-10-CM | POA: Insufficient documentation

## 2011-11-21 DIAGNOSIS — E119 Type 2 diabetes mellitus without complications: Secondary | ICD-10-CM

## 2011-11-21 DIAGNOSIS — J9600 Acute respiratory failure, unspecified whether with hypoxia or hypercapnia: Secondary | ICD-10-CM

## 2011-11-21 DIAGNOSIS — N2581 Secondary hyperparathyroidism of renal origin: Secondary | ICD-10-CM | POA: Diagnosis present

## 2011-11-21 DIAGNOSIS — D638 Anemia in other chronic diseases classified elsewhere: Secondary | ICD-10-CM

## 2011-11-21 DIAGNOSIS — R0602 Shortness of breath: Secondary | ICD-10-CM | POA: Insufficient documentation

## 2011-11-21 DIAGNOSIS — J811 Chronic pulmonary edema: Secondary | ICD-10-CM | POA: Diagnosis present

## 2011-11-21 DIAGNOSIS — I10 Essential (primary) hypertension: Secondary | ICD-10-CM

## 2011-11-21 DIAGNOSIS — N19 Unspecified kidney failure: Secondary | ICD-10-CM

## 2011-11-21 DIAGNOSIS — N189 Chronic kidney disease, unspecified: Secondary | ICD-10-CM

## 2011-11-21 DIAGNOSIS — N058 Unspecified nephritic syndrome with other morphologic changes: Secondary | ICD-10-CM

## 2011-11-21 DIAGNOSIS — I120 Hypertensive chronic kidney disease with stage 5 chronic kidney disease or end stage renal disease: Secondary | ICD-10-CM

## 2011-11-21 DIAGNOSIS — I5032 Chronic diastolic (congestive) heart failure: Secondary | ICD-10-CM

## 2011-11-21 DIAGNOSIS — E785 Hyperlipidemia, unspecified: Secondary | ICD-10-CM

## 2011-11-21 DIAGNOSIS — M7989 Other specified soft tissue disorders: Secondary | ICD-10-CM

## 2011-11-21 LAB — GLUCOSE, CAPILLARY: Glucose-Capillary: 128 mg/dL — ABNORMAL HIGH (ref 70–99)

## 2011-11-21 LAB — RENAL FUNCTION PANEL
CO2: 28 mEq/L (ref 19–32)
Calcium: 10.1 mg/dL (ref 8.4–10.5)
GFR calc Af Amer: 9 mL/min — ABNORMAL LOW (ref >90)
GFR calc non Af Amer: 7 mL/min — ABNORMAL LOW (ref >90)
Phosphorus: 2 mg/dL — ABNORMAL LOW (ref 2.3–4.6)
Sodium: 137 mEq/L (ref 135–145)

## 2011-11-21 LAB — TROPONIN I: Troponin I: <0.3 ng/mL (ref <0.30)

## 2011-11-21 LAB — CBC
MCHC: 29.6 g/dL — ABNORMAL LOW (ref 30.0–36.0)
RDW: 22.3 % — ABNORMAL HIGH (ref 11.5–15.5)

## 2011-11-21 MED ORDER — L-METHYLFOLATE-B6-B12 3-35-2 MG PO TABS
1.0000 | ORAL_TABLET | Freq: Two times a day (BID) | ORAL | Status: DC
  Filled 2011-11-21: qty 1

## 2011-11-21 MED ORDER — CALCIUM ACETATE 667 MG PO CAPS
1334.0000 mg | ORAL_CAPSULE | Freq: Once | ORAL | Status: AC
  Administered 2011-11-21: 1334 mg via ORAL
  Filled 2011-11-21: qty 2

## 2011-11-21 MED ORDER — HEPARIN SODIUM (PORCINE) 5000 UNIT/ML IJ SOLN: 5000.0000 [IU] | Freq: Three times a day (TID) | INTRAMUSCULAR | Status: DC

## 2011-11-21 MED ORDER — LOSARTAN POTASSIUM 50 MG PO TABS
100.0000 mg | ORAL_TABLET | Freq: Every day | ORAL | Status: DC
  Filled 2011-11-21: qty 2

## 2011-11-21 MED ORDER — RENA-VITE PO TABS
1.0000 | ORAL_TABLET | Freq: Every day | ORAL | Status: DC
  Filled 2011-11-21: qty 1

## 2011-11-21 MED ORDER — PARICALCITOL 5 MCG/ML IV SOLN
INTRAVENOUS | Status: AC
  Administered 2011-11-21: 1 ug via INTRAVENOUS
  Filled 2011-11-21: qty 1

## 2011-11-21 MED ORDER — PARICALCITOL 5 MCG/ML IV SOLN
1.0000 ug | Freq: Once | INTRAVENOUS | Status: AC
  Administered 2011-11-21: 1 ug via INTRAVENOUS
  Filled 2011-11-21: qty 0.2

## 2011-11-21 MED ORDER — METOPROLOL SUCCINATE ER 100 MG PO TB24
100.0000 mg | ORAL_TABLET | Freq: Every day | ORAL | Status: DC
Start: 2011-11-21 — End: 2011-11-21
  Filled 2011-11-21: qty 1

## 2011-11-21 MED ORDER — ACETAMINOPHEN 650 MG RE SUPP: 650.0000 mg | Freq: Four times a day (QID) | RECTAL | Status: DC | PRN

## 2011-11-21 MED ORDER — ONDANSETRON HCL 8 MG PO TABS: 4.0000 mg | ORAL_TABLET | Freq: Four times a day (QID) | ORAL | Status: DC | PRN

## 2011-11-21 MED ORDER — ALBUTEROL SULFATE (5 MG/ML) 0.5% IN NEBU: 2.5000 mg | INHALATION_SOLUTION | RESPIRATORY_TRACT | Status: DC | PRN

## 2011-11-21 MED ORDER — AMLODIPINE BESYLATE 10 MG PO TABS
10.0000 mg | ORAL_TABLET | Freq: Every day | ORAL | Status: DC
  Filled 2011-11-21: qty 1

## 2011-11-21 MED ORDER — REPAGLINIDE 0.5 MG PO TABS
0.5000 mg | ORAL_TABLET | Freq: Three times a day (TID) | ORAL | Status: DC
  Filled 2011-11-21 (×2): qty 1

## 2011-11-21 MED ORDER — HYDRALAZINE HCL 50 MG PO TABS
50.0000 mg | ORAL_TABLET | Freq: Once | ORAL | Status: AC
  Administered 2011-11-21: 50 mg via ORAL
  Filled 2011-11-21: qty 1

## 2011-11-21 MED ORDER — DOCUSATE SODIUM 100 MG PO CAPS
200.0000 mg | ORAL_CAPSULE | Freq: Every day | ORAL | Status: DC
  Filled 2011-11-21: qty 2

## 2011-11-21 MED ORDER — CALCIUM ACETATE 667 MG PO CAPS
667.0000 mg | ORAL_CAPSULE | Freq: Three times a day (TID) | ORAL | Status: DC
  Filled 2011-11-21: qty 1

## 2011-11-21 MED ORDER — ONDANSETRON HCL 4 MG/2ML IJ SOLN: 4.0000 mg | Freq: Four times a day (QID) | INTRAMUSCULAR | Status: DC | PRN

## 2011-11-21 MED ORDER — PARICALCITOL 5 MCG/ML IV SOLN: 1.0000 ug | Freq: Once | INTRAVENOUS | Status: DC

## 2011-11-21 MED ORDER — ALLOPURINOL 100 MG PO TABS
100.0000 mg | ORAL_TABLET | Freq: Every day | ORAL | Status: DC
  Filled 2011-11-21: qty 1

## 2011-11-21 MED ORDER — CALCIUM ACETATE 667 MG PO CAPS
667.0000 mg | ORAL_CAPSULE | Freq: Once | ORAL | Status: DC
  Filled 2011-11-21: qty 1

## 2011-11-21 MED ORDER — EPOETIN ALFA 20000 UNIT/ML IJ SOLN
20000.0000 [IU] | Freq: Once | INTRAMUSCULAR | Status: AC
  Administered 2011-11-21: 20000 [IU] via INTRAVENOUS
  Filled 2011-11-21: qty 1

## 2011-11-21 MED ORDER — ACETAMINOPHEN 325 MG PO TABS: 650.0000 mg | ORAL_TABLET | Freq: Four times a day (QID) | ORAL | Status: DC | PRN

## 2011-11-21 MED ORDER — SODIUM CHLORIDE 0.9 % IJ SOLN: 3.0000 mL | Freq: Two times a day (BID) | INTRAMUSCULAR | Status: DC

## 2011-11-21 MED ORDER — HYDRALAZINE HCL 50 MG PO TABS
50.0000 mg | ORAL_TABLET | Freq: Three times a day (TID) | ORAL | Status: DC
  Filled 2011-11-21: qty 1

## 2011-11-21 MED ORDER — METOPROLOL SUCCINATE ER 50 MG PO TB24
50.0000 mg | ORAL_TABLET | Freq: Every day | ORAL | Status: DC
  Administered 2011-11-21: 50 mg via ORAL
  Filled 2011-11-21: qty 1

## 2011-11-21 NOTE — H&P (Signed)
[facility] History and Physical  Van Ehlert Offutt ZOX:[id number] DOB: 1946/02/29 DOA: 11/21/2011  Referring physician: Dr. Wayland Salinas PCP: [name], MD   Chief Complaint: Dyspnea and worsening body swelling.  HPI: Alexandra [name] is a 66 y.o. female with history of ESRD on hemodialysis, type 2 diabetes mellitus, difficult to control hypertension presents to the emergency department secondary to dyspnea and worsening body swelling. She was discharged at approximately 6 AM on 9/7 after hemodialysis. According to the hemodialysis department, they were unable to draw any fluid across dialysis. They usually draw 1.5 L across each dialysis. Patient was in her usual state of health until yesterday afternoon when she started experiencing dyspnea on exertion/dyspnea while speaking long sentences. She also noticed worsening swelling of her upper eyelids left side of her face, right upper extremity and bilateral ankles. The symptoms progressively got worse over the night. She returned to the emergency department today. The hospitalist service is requested to admit for further evaluation and management. She denies chest pain, palpitations, cough or fever. She may have had orthopnea. It's not clear if she is compliant with low sodium diet and her medications. Nephrology was consulted by the EDP for dialysis.   Review of Systems: All systems reviewed and apart from history of presenting illness, are negative. Patient gives history of chronic right upper extremity swelling ongoing for 4-5 weeks and says that prior Doppler ultrasound were negative for DVT and it has not really changed. She denies pain in that extremity. She gives history of feeling drowsy when her blood pressure medications were adjusted.  Past Medical History  Diagnosis Date  . Chronic kidney disease     esrd  . Diabetes mellitus   . Hyperlipidemia   . Hypertension   . Renal disorder   . Dialysis care     tues,  thurs, sat  . Gout due to renal impairment    Past Surgical History  Procedure Date  . Carotid endarterectomy 2002    left  . Btl   . Rectal fissurectomy   . Av fistula placement 10/30/2010    right brachiocephalic   . Fistulogram    Social History:  reports that she has quit smoking. Her smoking use included Cigarettes. She has a 50 pack-year smoking history. She has never used smokeless tobacco. She reports that she does not drink alcohol or use illicit drugs. Independent of activities of daily living.  Allergies  Allergen Reactions  . Codeine Other (See Comments)    Passes out  . Epinephrine Other (See Comments)    Tachycardia, diaphoresis, syncope  . Percocet (Oxycodone-Acetaminophen) Other (See Comments)    Passes  out  . Statins Other (See Comments)    Liver enzymes rise   PATIENT DOES NOT WANT TO BE GIVEN THIS TYPE OF MEDICATION PER THE [facility]    Family History  Problem Relation Age of Onset  . COPD Mother   . Kidney disease Mother   . Heart disease Father   . Lymphoma Son     Prior to Admission medications   Medication Sig Start Date End Date Taking? Authorizing Provider  allopurinol (ZYLOPRIM) 100 MG tablet Take 100 mg by mouth daily.     Yes Historical Provider, MD  amLODipine (NORVASC) 5 MG tablet Take 10 mg by mouth daily. 07/13/11  Yes [name], MD  calcium acetate (PHOSLO) 667 MG capsule Take 667 mg by mouth 4 (four) times daily.    Yes Historical  Provider, MD  Coenzyme Q10 150 MG CAPS Take 300 mg by mouth daily. Hold while in hospital   Yes Historical Provider, MD  docusate sodium (COLACE) 100 MG capsule Take 200 mg by mouth at bedtime.     Yes Historical Provider, MD  hydrALAZINE (APRESOLINE) 25 MG tablet Take 50 mg by mouth 3 (three) times daily.  07/18/11  Yes [name], MD  l-methylfolate-B6-B12 (METANX) 3-35-2 MG TABS Take 1 tablet by mouth 2 (two) times daily.    Yes Historical Provider, MD  losartan (COZAAR)  50 MG tablet Take 100 mg by mouth at bedtime.    Yes Historical Provider, MD  metoprolol succinate (TOPROL-XL) 50 MG 24 hr tablet Take 100 mg by mouth daily. Takes after dialysis (T, Th, Sat)   Yes Historical Provider, MD  multivitamin (RENA-VIT) TABS tablet Take 1 tablet by mouth daily.     Yes Historical Provider, MD  repaglinide (PRANDIN) 0.5 MG tablet Take 0.5 mg by mouth 3 (three) times daily before meals.    Yes Historical Provider, MD   Physical Exam: Filed Vitals:   11/21/11 1507 11/21/11 1510 11/21/11 1530 11/21/11 1600  BP: 166/72 199/83 190/85 190/74  Pulse: 89 81 84 88  Temp:      TempSrc:      Resp: 15 15 14 20   Weight:      SpO2: 94%        General exam: Moderately built and nourished female patient who was sitting comfortably on the dialysis chair undergoing HD. She was in no obvious distress.  Head, eyes and ENT: Nontraumatic and normocephalic. Pupils equally reacting to light and accommodation. Oral mucosa is moist without acute findings.  Lymphatics: No lymphadenopathy.  Neck: Supple. No JVD or carotid bruit or thyromegaly.  Respiratory system: Slightly decreased breath sounds in the bases with few fine bibasal crackles. No increased work of breathing. Rest of the lung fields are clear.  Cardiovascular system: S1 and S2 heard, regular rate and rhythm. No JVD or murmurs or gallops. 1+ bilateral leg edema.  Gastrointestinal system: Abdomen is nondistended, soft, nontender and normal bowel sounds heard. No organomegaly or masses appreciated.  Central nervous system: Alert and oriented. No focal neurological deficit.  Extremities: Bilateral lower extremities 1+ pitting edema to the knees. Bilateral upper extremity edema right greater than the left. Symmetrical peripheral pulses are well felt.  Musculoskeletal system: Negative  Psychiatric: Present, appropriate and cooperative.  Labs on Admission:  Basic Metabolic Panel:  Lab 11/20/11 8119 11/18/11 0805  11/16/11 0237  NA 141 139 139  K 3.7 4.0 4.0  CL 99 100 99  CO2 29 27 25   GLUCOSE 189* 134* 187*  BUN 43* 62* 71*  CREATININE 7.00* 8.49* 8.42*  CALCIUM 10.1 9.7 10.0  MG -- -- --  PHOS 2.5 3.5 2.6   Liver Function Tests:  Lab 11/20/11 0142 11/18/11 0805 11/16/11 0237  AST -- -- --  ALT -- -- --  ALKPHOS -- -- --  BILITOT -- -- --  PROT -- -- --  ALBUMIN 3.3* 3.3* 3.3*   No results found for this basename: LIPASE:5,AMYLASE:5 in the last 168 hours No results found for this basename: AMMONIA:5 in the last 168 hours CBC:  Lab 11/21/11 1540 11/20/11 0141 11/18/11 0805 11/16/11 0237  WBC 9.1 7.5 7.2 7.2  NEUTROABS -- 4.2 3.9 4.0  HGB 9.6* 9.5* 9.2* 9.3*  HCT 32.4* 30.8* 29.0* 29.9*  MCV 77.3* 76.4* 75.5* 76.9*  PLT 190 193 163 176  Cardiac Enzymes: No results found for this basename: CKTOTAL:5,CKMB:5,CKMBINDEX:5,TROPONINI:5 in the last 168 hours  BNP (last 3 results)  Basename 10/15/11 0707  PROBNP 31669.0*   CBG:  Lab 11/21/11 1523 11/21/11 1434 11/21/11 1142 11/19/11 2231 11/18/11 1221  GLUCAP 128* 116* 158* 114* 133*    Radiological Exams on Admission: Dg Chest 2 View  11/21/2011  *RADIOLOGY REPORT*  Clinical Data: Shortness of breath, hypertension, and facial swelling.  CHEST - 2 VIEW  Comparison: Chest radiograph 10/15/2011, and multiple priors.  Findings: Right IJ dialysis catheter terminates in the distal superior vena cava.  Mild cardiomegaly is stable.  There is pulmonary vascular congestion.  No definite pulmonary edema.  There is slight blunting of the both costophrenic angles, new compared to a two-view chest radiograph of 07/12/2011.  Question very small bilateral pleural effusions.  IMPRESSION:  1.  Stable mild cardiomegaly with pulmonary vascular congestion. 2.  Suspect very small bilateral pleural effusion. 3.  Stable position of right IJ dialysis catheter.   Original Report Authenticated By: Britta Mccreedy, M.D.     EKG: Has been  requested.  Assessment/Plan Principal Problem:  *Pulmonary edema Active Problems:  Hyperparathyroidism, secondary renal  ESRD on hemodialysis  Diabetes mellitus with end stage renal disease  HTN (hypertension), malignant  Anemia of renal disease   1. Pulmonary edema: Likely secondary to volume overload from end-stage renal disease and suboptimal HD during previous dialysis. She seems to be slightly over her dry weight at dialysis today. Recent 2-D echo showed mild LVH and EF of 50-55%. Agree with hemodialysis progress volume status and offload some of the fluid. Nephrology has already consulted. Patient may consider outpatient cardiology consultation. 2. ESRD: Management per nephrology and continue hemodialysis. 3. Uncontrolled hypertension: Patient has difficult to control hypertension which is probably complicated by volume overload status and questionable compliance with low sodium diet and medications. Patient counseled regarding compliance with medications and probably a single M.D. needs to monitor and make adjustments to patient's medications. For now continue current home medications and monitor. 4. Type 2 diabetes mellitus: Continue home medications and monitor CBGs. 5. Anemia of chronic kidney disease: Stable.  Code Status: Full Family Communication: Discussed patient's care at length with patient's daughter [name], at the patient's request. Updated care and answered questions. She indicated that they are pursuing options for outpatient dialysis. Disposition Plan: Home when stable.  Time spent: 65 minutes  [facility] Pager [phone]  If 7PM-7AM, please contact night-coverage [email or web address] Password [facility] 11/21/2011, 4:35 PM

## 2011-11-21 NOTE — ED Notes (Signed)
Spoke to Pharmacy about the patient's Prandin and stated that it is a formulary and RN will not be able to enter it in the computer.

## 2011-11-21 NOTE — ED Notes (Signed)
Pt wanted her CBG checked. CBG was 158. RN notified

## 2011-11-21 NOTE — Progress Notes (Signed)
Spoke with Dr Bascom Levels regarding patient. Dr Bascom Levels has given orders for hemodialysis which have been read back and verified. Patient is in the ED waiting to be seen by the hospitalist

## 2011-11-21 NOTE — Discharge Summary (Signed)
Physician Discharge Summary  Alexandra [name] VHQ:[id number] DOB: 07-Apr-1945 DOA: 11/21/2011  PCP: [name], MD  Admit date: 11/21/2011 Discharge date: 11/21/2011  Recommendations for Outpatient Follow-up:  1. Followup with primary medical doctor in one week from hospital discharge. 2. Followup with primary nephrologist in one week from hospital discharge  Discharge Diagnoses:  Principal Problem:  *Pulmonary edema Active Problems:  Hyperparathyroidism, secondary renal  ESRD on hemodialysis  Diabetes mellitus with end stage renal disease  HTN (hypertension), malignant  Anemia of renal disease   Discharge Condition: Improved and stable.  Diet recommendation: Heart healthy and diabetic.  Filed Weights   11/21/11 1500  Weight: 59.6 kg (131 lb 6.3 oz)    History of present illness:  66 y.o. female with history of ESRD on hemodialysis, type 2 diabetes mellitus, difficult to control hypertension presents to the emergency department secondary to dyspnea and worsening body swelling. She was discharged at approximately 6 AM on 9/7 after hemodialysis. According to the hemodialysis department, they were unable to draw any fluid across dialysis. They usually draw 1.5 L across each dialysis. Patient was in her usual state of health until yesterday afternoon when she started experiencing dyspnea on exertion/dyspnea while speaking long sentences. She also noticed worsening swelling of her upper eyelids left side of her face, right upper extremity and bilateral ankles. The symptoms progressively got worse over the night. She returned to the emergency department today.    Hospital Course:  From the emergency department patient went straight to the dialysis unit and started her hemodialysis under the supervision of Dr. Jeri Cos. Thus far approximately 1300 mL of negative balance has been achieved and the target is 1600 mL prior to completion. Patient indicates that she feels  significantly better. She denies any further dyspnea even while talking long sentences. She never complained of chest pain, palpitations or cough. She also tells [name] that her right upper extremity swelling has almost resolved. She is eager to go home. Her blood pressures continued to be significantly elevated. Patient has been advised to stay in in the hospital to further address this. She refuses and insists on going home. She is advised that persistent high blood pressures would have dangerous consequences both on short and long-term. She however is adamant on going home. Discussed the same situation with her daughter [name]. At this time we'll discharge patient home as per her wishes but recommend that she be compliant with her medications and followup closely with her outpatient MDs regarding better control of her blood pressure. She verbalizes understanding. Patient also informed us that she had a stress test done at [facility] approximately 5 months ago and that it was normal.  Procedures:  Hemodialysis.   Consultations:  Nephrology  Discharge Exam:  Complaints No dyspnea, chest pain, palpitations or cough. Right upper extremity swelling significantly improved.   Filed Vitals:   11/21/11 1730 11/21/11 1800 11/21/11 1830 11/21/11 1900  BP: 198/94 194/89 182/78 202/77  Pulse: 75 79 79 85  Temp:      TempSrc:      Resp: 15 16 22 22   Weight:      SpO2:        General exam: Patient looks better than she did couple of hours ago. Comfortable. Facial puffiness is better and possibly resolved. Respiratory system: Clear to auscultation. No increased work of breathing. Cardiovascular system: S1 and S2 heard, regular rate and rhythm. No murmurs, gallops or JVD. 1+ bilateral lower extremity edema. Gastrointestinal system: Abdomen  is nondistended, soft and nontender. Normal bowel sounds heard. Central nervous system: Alert and oriented. No focal neurological deficits. Extremities:  Right forearm and dorsum of hand swelling has significantly improved.  Discharge Instructions   Medication List  As of 11/21/2011  7:11 PM   TAKE these medications         allopurinol 100 MG tablet   Commonly known as: ZYLOPRIM   Take 100 mg by mouth daily.      amLODipine 5 MG tablet   Commonly known as: NORVASC   Take 10 mg by mouth daily.      calcium acetate 667 MG capsule   Commonly known as: PHOSLO   Take 667 mg by mouth 4 (four) times daily.      Coenzyme Q10 150 MG Caps   Take 300 mg by mouth daily. Hold while in hospital      docusate sodium 100 MG capsule   Commonly known as: COLACE   Take 200 mg by mouth at bedtime.      hydrALAZINE 25 MG tablet   Commonly known as: APRESOLINE   Take 50 mg by mouth 3 (three) times daily.      l-methylfolate-B6-B12 3-35-2 MG Tabs   Commonly known as: METANX   Take 1 tablet by mouth 2 (two) times daily.      losartan 50 MG tablet   Commonly known as: COZAAR   Take 100 mg by mouth at bedtime.      metoprolol succinate 50 MG 24 hr tablet   Commonly known as: TOPROL-XL   Take 100 mg by mouth daily. Takes after dialysis (T, Th, Sat)      multivitamin Tabs tablet   Take 1 tablet by mouth daily.      repaglinide 0.5 MG tablet   Commonly known as: PRANDIN   Take 0.5 mg by mouth 3 (three) times daily before meals.           Follow-up Information    Follow up with [name], MD. Schedule an appointment as soon as possible for a visit in 1 week.   Contact information:   [address] [place] [address] [phone]       Follow up with Jeri Cos, MD. Schedule an appointment as soon as possible for a visit in 1 week.   Contact information:   [address] [address] [place] [address] [phone]           The results of significant diagnostics from this hospitalization (including imaging, microbiology, ancillary and laboratory) are listed below  for reference.    Significant Diagnostic Studies: Dg Chest 2 View  11/21/2011  *RADIOLOGY REPORT*  Clinical Data: Shortness of breath, hypertension, and facial swelling.  CHEST - 2 VIEW  Comparison: Chest radiograph 10/15/2011, and multiple priors.  Findings: Right IJ dialysis catheter terminates in the distal superior vena cava.  Mild cardiomegaly is stable.  There is pulmonary vascular congestion.  No definite pulmonary edema.  There is slight blunting of the both costophrenic angles, new compared to a two-view chest radiograph of 07/12/2011.  Question very small bilateral pleural effusions.  IMPRESSION:  1.  Stable mild cardiomegaly with pulmonary vascular congestion. 2.  Suspect very small bilateral pleural effusion. 3.  Stable position of right IJ dialysis catheter.   Original Report Authenticated By: Britta Mccreedy, M.D.    Microbiology: No results found for this or any previous visit (from the past 240 hour(s)).  Labs: Basic Metabolic Panel:  Lab 11/21/11 2130 11/20/11 0142 11/18/11 0805 11/16/11 0237  NA 137 141 139 139  K 4.7 3.7 4.0 4.0  CL 98 99 100 99  CO2 28 29 27 25   GLUCOSE 125* 189* 134* 187*  BUN 30* 43* 62* 71*  CREATININE 5.33* 7.00* 8.49* 8.42*  CALCIUM 10.1 10.1 9.7 10.0  MG -- -- -- --  PHOS 2.0* 2.5 3.5 2.6   Liver Function Tests:  Lab 11/21/11 1540 11/20/11 0142 11/18/11 0805 11/16/11 0237  AST -- -- -- --  ALT -- -- -- --  ALKPHOS -- -- -- --  BILITOT -- -- -- --  PROT -- -- -- --  ALBUMIN 3.4* 3.3* 3.3* 3.3*   No results found for this basename: LIPASE:5,AMYLASE:5 in the last 168 hours No results found for this basename: AMMONIA:5 in the last 168 hours CBC:  Lab 11/21/11 1540 11/20/11 0141 11/18/11 0805 11/16/11 0237  WBC 9.1 7.5 7.2 7.2  NEUTROABS -- 4.2 3.9 4.0  HGB 9.6* 9.5* 9.2* 9.3*  HCT 32.4* 30.8* 29.0* 29.9*  MCV 77.3* 76.4* 75.5* 76.9*  PLT 190 193 163 176   Cardiac Enzymes:  Lab 11/21/11 1633  CKTOTAL --  CKMB --  CKMBINDEX --    TROPONINI <0.30   BNP: BNP (last 3 results)  Basename 10/15/11 0707  PROBNP 31669.0*   CBG:  Lab 11/21/11 1523 11/21/11 1434 11/21/11 1142 11/19/11 2231 11/18/11 1221  GLUCAP 128* 116* 158* 114* 133*   EKG: Normal sinus rhythm at 83 beats per minute, normal axis, possible left atrial enlargement, left ventricular hypertrophy but no acute changes. QTC 472 ms.   Time coordinating discharge: 20 minutes  Signed:  Meleane Selinger  [facility] 11/21/2011, 7:11 PM

## 2011-11-21 NOTE — Progress Notes (Signed)
Dr. Kellie Moor called back to unit to see what pt's current BP was b/c the last one he saw in [facility] was [id number], I told him it was now 208, but that this pt has history of very high bp during HD and that she did have some bp meds in the ED, but nothing prn during HD and she didn't take her home bp meds as she commonly does during the tx even though we advise her against it. I also let him know that the bp after rinse back was typically elevated on most HD pts b/c they just got a 250 NS bolus.  He told me to advise the pt that he was now recommending that the pt not be d/c home and stay to be admitted and to explain to the pt the detrimental position she would be putting herself in and see what she said.  I explained to the pt exactly what the MD told me to and she stated "absolutely not" in reference to staying to be admitted.  She also disagreed w/ me about her bp typically being high, and I talked w/ her more about how her bp does on HD and reminded her.  Pt leaving the unit even though she was advised not to, MD said it was ok for her to still go if she understood the detrimental situation she was in w/ her bp and as long as she was going to manage it at home.  Pt left via w/c w/ tech to the ED waiting area to wait on her ride.

## 2011-11-21 NOTE — ED Notes (Signed)
Report given to Cory RN

## 2011-11-21 NOTE — ED Notes (Signed)
Dialysis yesterday; sob last night, bp high - 230/98. Sob with exertion. Facial swollen.

## 2011-11-21 NOTE — ED Notes (Signed)
Dr. Waymon Amato informed RN that he is going to see the patient at the dialysis unit.

## 2011-11-21 NOTE — ED Notes (Signed)
MD at bedside. 

## 2011-11-21 NOTE — ED Provider Notes (Signed)
History     CSN: [id number]  Arrival date & time 11/21/11  1110   First MD Initiated Contact with Patient 11/21/11 1216      Chief Complaint  Patient presents with  . Shortness of Breath  . Hypertension  . Facial Swelling    (Consider location/radiation/quality/duration/timing/severity/associated sxs/prior treatment) HPI This 66 year old dialysis patient was to be dialyzed again today. She states she last finished dialysis yesterday morning but sometimes has to have dialysis a few days in a row. She states she was about a dry weight yesterday already said he did not take off very much fluid during dialysis. She states that overnight she has developed some recurrent edema to her right hand and some edema to her cheeks and some shortness of breath off-and-on typical for fluid overload and need for repeat dialysis. She is no fever cough chest pain abdominal pain vomiting confusion or new focal neurologic concerns. She does not want any lab tests in the ED or ECG, she wants the hospitalist paged so she can get to dialysis again, she will allow a chest x-ray to be ordered in the ED. Her blood pressure was 230/98 this morning but it is improving after taking her medicines. Past Medical History  Diagnosis Date  . Chronic kidney disease     esrd  . Diabetes mellitus   . Hyperlipidemia   . Hypertension   . Renal disorder   . Dialysis care     tues, thurs, sat  . 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 Carotid endarterectomy 2002    left  . Btl   . Rectal fissurectomy   . Av fistula placement 10/30/2010    right brachiocephalic   . Fistulogram     Family History  Problem Relation Age of Onset  . COPD Mother   . Kidney disease Mother   . Heart disease Father   . Lymphoma Son     History  Substance Use Topics  . Smoking status: Former Smoker -- 1.0 packs/day for 50 years    Types: Cigarettes  . Smokeless tobacco: Never Used  . Alcohol Use:  No    OB History    Grav Para Term Preterm Abortions TAB SAB Ect Mult Living                  Review of Systems 10 Systems reviewed and are negative for acute change except as noted in the HPI. Allergies  Codeine; Epinephrine; Percocet; and Statins  Home Medications   Current Outpatient Rx  Name Route Sig Dispense Refill  . ALLOPURINOL 100 MG PO TABS Oral Take 100 mg by mouth daily.      Marland Kitchen AMLODIPINE BESYLATE 5 MG PO TABS Oral Take 10 mg by mouth daily.    Marland Kitchen CALCIUM ACETATE 667 MG PO CAPS Oral Take 667 mg by mouth 4 (four) times daily.     Marland Kitchen COENZYME Q10 150 MG PO CAPS Oral Take 300 mg by mouth daily. Hold while in hospital    . DOCUSATE SODIUM 100 MG PO CAPS Oral Take 200 mg by mouth at bedtime.      Marland Kitchen HYDRALAZINE HCL 25 MG PO TABS Oral Take 50 mg by mouth 3 (three) times daily.     . L-METHYLFOLATE-B6-B12 3-35-2 MG PO TABS Oral Take 1 tablet by mouth 2 (two) times daily.     Marland Kitchen RENA-VITE PO TABS Oral Take 1 tablet by mouth daily.      Marland Kitchen  REPAGLINIDE 0.5 MG PO TABS Oral Take 0.5 mg by mouth 3 (three) times daily before meals.     Marland Kitchen BENZONATATE 100 MG PO CAPS Oral Take 100 mg by mouth 3 (three) times daily as needed. cough    . CLONIDINE HCL 0.1 MG/24HR TD PTWK Transdermal Place 1 patch onto the skin once a week.    Marland Kitchen CLONIDINE HCL 0.2 MG/24HR TD PTWK Transdermal Place 1 patch onto the skin once a week. Will start 11/25/11 after dialysis    . LOSARTAN POTASSIUM 100 MG PO TABS Oral Take 100 mg by mouth 2 (two) times daily.    Marland Kitchen METOPROLOL SUCCINATE ER 100 MG PO TB24 Oral Take 50 mg by mouth 2 (two) times daily. On Tuesday, Thursday and Saturday, she waits until after dialysis to take this medication    . PROMETHAZINE HCL 6.25 MG/5ML PO SYRP Oral Take 3.125-6.25 mg by mouth 4 (four) times daily as needed. cough      BP 208/77  Pulse 86  Temp 97.8 F (36.6 C) (Oral)  Resp 22  Wt 128 lb 10.2 oz (58.35 kg)  SpO2 93%  LMP 09/27/2011  Physical Exam  Nursing note and vitals  reviewed. Constitutional:       Awake, alert, nontoxic appearance.  HENT:  Head: Atraumatic.  Eyes: Right eye exhibits no discharge. Left eye exhibits no discharge.  Neck: Neck supple.  Cardiovascular: Normal rate and regular rhythm.   No murmur heard. Pulmonary/Chest: Effort normal. No respiratory distress. She has no wheezes. She has rales. She exhibits no tenderness.       Bibasilar crackles  Abdominal: Soft. There is no tenderness. There is no rebound.  Musculoskeletal: She exhibits edema. She exhibits no tenderness.       Baseline ROM, no obvious new focal weakness. Trace edema to her lower legs, mild to moderate edema to right hand, trace edema to her left cheek  Neurological: She is alert.       Mental status and motor strength appears baseline for patient and situation.  Skin: No rash noted.  Psychiatric: She has a normal mood and affect.    ED Course  Procedures (including critical care time)  Labs Reviewed  GLUCOSE, CAPILLARY - Abnormal; Notable for the following:    Glucose-Capillary 158 (*)     All other components within normal limits  GLUCOSE, CAPILLARY - Abnormal; Notable for the following:    Glucose-Capillary 116 (*)     All other components within normal limits  GLUCOSE, CAPILLARY - Abnormal; Notable for the following:    Glucose-Capillary 128 (*)     All other components within normal limits  RENAL FUNCTION PANEL - Abnormal; Notable for the following:    Glucose, Bld 125 (*)     BUN 30 (*)     Creatinine, Ser 5.33 (*)     Phosphorus 2.0 (*)     Albumin 3.4 (*)     GFR calc non Af Amer 7 (*)     GFR calc Af Amer 9 (*)     All other components within normal limits  CBC - Abnormal; Notable for the following:    Hemoglobin 9.6 (*)     HCT 32.4 (*)     MCV 77.3 (*)     MCH 22.9 (*)     MCHC 29.6 (*)     RDW 22.3 (*)     All other components within normal limits  TROPONIN I  LAB REPORT - SCANNED   No  results found.   1. Volume overload   2. End  stage renal disease on dialysis   3. Pulmonary edema   4. ESRD on hemodialysis   5. HTN (hypertension), malignant   6. Diabetes mellitus with end stage renal disease   7. Hyperparathyroidism, secondary renal   8. Anemia of renal disease   9. Hyperlipidemia   10. Anemia of chronic disease   11. ESRD (end stage renal disease) on dialysis   12. Diabetes mellitus   13. HTN (hypertension), benign   14. Hemodialysis patient   15. H/O: gout   16. Renal failure   17. Hypertensive CKD, ESRD on dialysis   18. Arm swelling   19. Other complications due to renal dialysis device, implant, and graft   20. End stage renal disease   21. Acute respiratory failure   22. Leukocytosis   23. Troponin level elevated   24. Chronic diastolic CHF (congestive heart failure)   25. Tobacco abuse       MDM  Patient / Family / Caregiver informed of clinical course, understand medical decision-making process, and agree with plan.Pt stable in ED with no significant deterioration in condition.        Hurman Horn, MD 11/25/11 [phone]

## 2011-11-22 ENCOUNTER — Other Ambulatory Visit (HOSPITAL_COMMUNITY): Payer: Self-pay | Admitting: *Deleted

## 2011-11-23 ENCOUNTER — Observation Stay (HOSPITAL_COMMUNITY): Payer: Medicare HMO

## 2011-11-23 ENCOUNTER — Other Ambulatory Visit (HOSPITAL_COMMUNITY): Payer: Self-pay | Admitting: *Deleted

## 2011-11-23 ENCOUNTER — Encounter (HOSPITAL_COMMUNITY): Payer: Self-pay | Admitting: *Deleted

## 2011-11-23 ENCOUNTER — Observation Stay (HOSPITAL_COMMUNITY)
Admission: EM | Admit: 2011-11-23 | Discharge: 2011-11-23 | Disposition: A | Discharge status: Home / Self Care | Payer: Medicare HMO | Attending: Internal Medicine | Admitting: Internal Medicine

## 2011-11-23 DIAGNOSIS — N19 Unspecified kidney failure: Secondary | ICD-10-CM

## 2011-11-23 DIAGNOSIS — D638 Anemia in other chronic diseases classified elsewhere: Secondary | ICD-10-CM

## 2011-11-23 DIAGNOSIS — N186 End stage renal disease: Secondary | ICD-10-CM

## 2011-11-23 DIAGNOSIS — E8779 Other fluid overload: Secondary | ICD-10-CM | POA: Insufficient documentation

## 2011-11-23 DIAGNOSIS — N2581 Secondary hyperparathyroidism of renal origin: Secondary | ICD-10-CM | POA: Insufficient documentation

## 2011-11-23 DIAGNOSIS — I120 Hypertensive chronic kidney disease with stage 5 chronic kidney disease or end stage renal disease: Secondary | ICD-10-CM | POA: Insufficient documentation

## 2011-11-23 DIAGNOSIS — D631 Anemia in chronic kidney disease: Secondary | ICD-10-CM | POA: Insufficient documentation

## 2011-11-23 DIAGNOSIS — E785 Hyperlipidemia, unspecified: Secondary | ICD-10-CM | POA: Insufficient documentation

## 2011-11-23 DIAGNOSIS — Z992 Dependence on renal dialysis: Secondary | ICD-10-CM | POA: Insufficient documentation

## 2011-11-23 DIAGNOSIS — M7989 Other specified soft tissue disorders: Secondary | ICD-10-CM

## 2011-11-23 DIAGNOSIS — I10 Essential (primary) hypertension: Secondary | ICD-10-CM

## 2011-11-23 LAB — RENAL FUNCTION PANEL
Albumin: 3.1 g/dL — ABNORMAL LOW (ref 3.5–5.2)
CO2: 29 mEq/L (ref 19–32)
Calcium: 9.3 mg/dL (ref 8.4–10.5)
GFR calc Af Amer: 8 mL/min — ABNORMAL LOW (ref >90)
GFR calc non Af Amer: 7 mL/min — ABNORMAL LOW (ref >90)
Phosphorus: 2.6 mg/dL (ref 2.3–4.6)
Sodium: 140 mEq/L (ref 135–145)

## 2011-11-23 LAB — CBC WITH DIFFERENTIAL/PLATELET
Basophils Absolute: 0 10*3/uL (ref 0.0–0.1)
Eosinophils Relative: 2 % (ref 0–5)
MCH: 22.8 pg — ABNORMAL LOW (ref 26.0–34.0)
Monocytes Absolute: 0.8 10*3/uL (ref 0.1–1.0)
Neutrophils Relative %: 66 % (ref 43–77)
Platelets: 162 10*3/uL (ref 150–400)
RBC: 3.77 MIL/uL — ABNORMAL LOW (ref 3.87–5.11)
WBC: 7.4 10*3/uL (ref 4.0–10.5)

## 2011-11-23 MED ORDER — EPOETIN ALFA 20000 UNIT/ML IJ SOLN
20000.0000 [IU] | Freq: Once | INTRAMUSCULAR | Status: AC
  Administered 2011-11-23: 20000 [IU] via SUBCUTANEOUS
  Filled 2011-11-23 (×2): qty 1

## 2011-11-23 MED ORDER — PARICALCITOL 5 MCG/ML IV SOLN
1.0000 ug | Freq: Once | INTRAVENOUS | Status: AC
  Administered 2011-11-23: 1 ug via INTRAVENOUS

## 2011-11-23 MED ORDER — METOPROLOL SUCCINATE ER 50 MG PO TB24
50.0000 mg | ORAL_TABLET | Freq: Once | ORAL | Status: AC
  Administered 2011-11-23: 50 mg via ORAL
  Filled 2011-11-23: qty 1

## 2011-11-23 MED ORDER — LOSARTAN POTASSIUM 50 MG PO TABS
50.0000 mg | ORAL_TABLET | Freq: Once | ORAL | Status: AC
Start: 2011-11-23 — End: 2011-11-23
  Administered 2011-11-23: 50 mg via ORAL
  Filled 2011-11-23: qty 1

## 2011-11-23 MED ORDER — CLONIDINE HCL 0.2 MG PO TABS
0.2000 mg | ORAL_TABLET | Freq: Once | ORAL | Status: DC
  Filled 2011-11-23: qty 1

## 2011-11-23 MED ORDER — PARICALCITOL 5 MCG/ML IV SOLN
INTRAVENOUS | Status: AC
  Administered 2011-11-23: 1 ug via INTRAVENOUS
  Filled 2011-11-23: qty 1

## 2011-11-23 NOTE — ED Notes (Signed)
Here for routine HD. Instructed to come in at this time. Mentions some facial and hand swelling. Denies CP,sob or other sx.

## 2011-11-23 NOTE — H&P (Signed)
[facility] History and Physical  Alexandra [name] ZOX:[id number] DOB: March 08, 1946 DOA: 11/23/2011   PCP: [name], MD   Chief Complaint: Fluid overload, facial edema  HPI:  66 year old female with end-stage renal disease who presented with feeling fluid overloaded secondary to facial edema and right upper extremity swelling. The patient states that she has had a right upper extremity swelling for many months and usually it improves after dialysis. The present stenosis, more hospital on Tuesday, Thursday, and Saturdays for dialysis. Most of the time, the patient is admitted to the hospital by the hospitalists and the patient leaves not infrequently after dialysis even before being seen by the hospitalist physician. On numerous occasions, the patient presents with uncontrolled hypertension with systolic blood pressures in the upper 190s to 230s; however the patient frequently still refuses to stay in the hospital for better blood pressure control. She adamantly states that she is compliant with her home medications which include amlodipine 5 mg daily, hydralazine 50 mg 3 times a day, losartan 100 mg at bedtime, and metoprolol 50 mg twice a day.   Today, the patient presented with the above symptoms; she denied any chest pain, shortness breath, coughing, hemoptysis, orthopnea, PND. She states that she has some facial edema which has somewhat improved while she is on dialysis. She denies any right upper extremity pain but complains of chronic swelling in that extremity. I want to see the patient while she was on dialysis and explained to the patient the importance of compliance as well as staying in the hospital for an additional 24 hours to get better blood pressure control. However the patient seems quite resistant at this time, and she states that she will think about it upon my return to check up on her at the end of dialysis.   Addendum: At 1200 hours, I want to revisit the patient  on dialysis. The patient remains quite adamant that she was to go home. Repeat blood pressure setting of dialysis revealed 173/78. I called the patient's daughter, [name] and updated her on her mother's condition. [name] stated that she was comfortable letting the patient go home as long as the patient follows up with [name] at 3 PM today. At the time of my revisit, the patient denied any chest pain, shortness of breath, dizziness, headache, fevers, chills. She states that her facial edema has improved. Therefore, the patient will be discharged home from dialysis with instructions to followup with her nephrologist, [name] today at 3 PM. She was instructed to take her blood pressure medications that she did not take this morning before dialysis which include amlodipine, hydralazine, and metoprolol.  Assessment/Plan:  malignant hypertension  -Suspect a degree of noncompliance End-stage renal disease  secondary hyperparathyroidism  Anemia of CKD  Hyperlipidemia -Patient refuses statins       Past Medical History  Diagnosis Date  . Chronic kidney disease     esrd  . Diabetes mellitus   . Hyperlipidemia   . Hypertension   . Renal disorder   . Dialysis care     tues, thurs, sat  . Gout due to renal impairment    Past Surgical History  Procedure Date  . Carotid endarterectomy 2002    left  . Btl   . Rectal fissurectomy   . Av fistula placement 10/30/2010    right brachiocephalic   . Fistulogram    Social History:  reports that she has quit smoking. Her smoking use included Cigarettes. She  has a 50 pack-year smoking history. She has never used smokeless tobacco. She reports that she does not drink alcohol or use illicit drugs.  Allergies  Allergen Reactions  . Codeine Other (See Comments)    Passes out  . Epinephrine Other (See Comments)    Tachycardia, diaphoresis, syncope  . Percocet (Oxycodone-Acetaminophen) Other (See Comments)    Passes  out  .  Statins Other (See Comments)    Liver enzymes rise   PATIENT DOES NOT WANT TO BE GIVEN THIS TYPE OF MEDICATION PER THE [facility] HER [facility]    Family History  Problem Relation Age of Onset  . COPD Mother   . Kidney disease Mother   . Heart disease Father   . Lymphoma Son     Prior to Admission medications   Medication Sig Start Date End Date Taking? Authorizing Provider  allopurinol (ZYLOPRIM) 100 MG tablet Take 100 mg by mouth daily.     Yes Historical Provider, MD  amLODipine (NORVASC) 5 MG tablet Take 10 mg by mouth daily. 07/13/11  Yes [name], MD  calcium acetate (PHOSLO) 667 MG capsule Take 667 mg by mouth 4 (four) times daily.    Yes Historical Provider, MD  Coenzyme Q10 150 MG CAPS Take 300 mg by mouth daily. Hold while in hospital   Yes Historical Provider, MD  docusate sodium (COLACE) 100 MG capsule Take 200 mg by mouth at bedtime.     Yes Historical Provider, MD  hydrALAZINE (APRESOLINE) 25 MG tablet Take 50 mg by mouth 3 (three) times daily.  07/18/11  Yes [name], MD  l-methylfolate-B6-B12 (METANX) 3-35-2 MG TABS Take 1 tablet by mouth 2 (two) times daily.    Yes Historical Provider, MD  losartan (COZAAR) 50 MG tablet Take 100 mg by mouth at bedtime.    Yes Historical Provider, MD  metoprolol succinate (TOPROL-XL) 50 MG 24 hr tablet Take 100 mg by mouth daily. Takes after dialysis (T, Th, Sat)   Yes Historical Provider, MD  multivitamin (RENA-VIT) TABS tablet Take 1 tablet by mouth daily.     Yes Historical Provider, MD  repaglinide (PRANDIN) 0.5 MG tablet Take 0.5 mg by mouth 3 (three) times daily before meals.    Yes Historical Provider, MD    Review of Systems:  Constitutional:  No weight loss, night sweats, Fevers, chills, fatigue.  Head&Eyes: No headache.  No vision loss.  No eye pain or scotoma ENT:  No Difficulty swallowing,Tooth/dental problems,Sore throat,  No ear ache, post nasal drip,  Cardio-vascular:  No chest pain,  Orthopnea, PND, swelling in lower extremities,  dizziness, palpitations  GI:  No heartburn, indigestion, abdominal pain, nausea, vomiting, diarrhea,loss of appetite, hematochezia, melena Resp:  No shortness of breath with exertion or at rest. No excess mucus, no productive cough, No non-productive cough, No coughing up of blood.No change in color of mucus.No wheezing.No chest wall deformity  Skin:  no rash or lesions.  GU:   No flank pain.  Musculoskeletal:  Right upper extremity edema  No decreased range of motion. No back pain.  Psych:  No change in mood or affect. No depression or anxiety. Neurologic: No headache, no dysesthesia, no focal weakness, no vision loss. No syncope  Physical Exam: Filed Vitals:   11/23/11 0900 11/23/11 0924 11/23/11 0930 11/23/11 1000  BP: 209/84  199/67 197/79  Pulse: 88  87 91  Temp:      TempSrc:      Resp: 18  18 18  Height:  5' 4.96" (1.65 m)    Weight:  59.4 kg (130 lb 15.3 oz)    SpO2:       General:  A&O x 3, NAD, nontoxic, pleasant/cooperative Head/Eye: No conjunctival hemorrhage, no icterus, [place]/AT, No nystagmus; no facial or periorbital edema noted. ENT:  No icterus,  No thrush, good dentition, no pharyngeal exudate Neck:  No masses, no lymphadenpathy, no bruits CV:  RRR, no rub, no gallop, no S3 Lung:  CTAB, good air movement, no wheeze, no rhonchi Abdomen: soft/NT, +BS, nondistended, no peritoneal signs Ext: No cyanosis, No rashes, No petechiae, No lymphangitis, 1+ right upper extremity edema without any rashes, lymphangitis, synovitis. Trace lower extremity edema bilateral   Labs on Admission:  Basic Metabolic Panel:  Lab 11/23/11 1610 11/21/11 1540 11/20/11 0142 11/18/11 0805  NA 140 137 141 139  K 3.3* 4.7 -- --  CL 100 98 99 100  CO2 29 28 29 27   GLUCOSE 176* 125* 189* 134*  BUN 36* 30* 43* 62*  CREATININE 5.84* 5.33* 7.00* 8.49*  CALCIUM 9.3 10.1 10.1 9.7  MG -- -- -- --  PHOS 2.6 2.0* 2.5 3.5   Liver Function  Tests:  Lab 11/23/11 0833 11/21/11 1540 11/20/11 0142 11/18/11 0805  AST -- -- -- --  ALT -- -- -- --  ALKPHOS -- -- -- --  BILITOT -- -- -- --  PROT -- -- -- --  ALBUMIN 3.1* 3.4* 3.3* 3.3*   No results found for this basename: LIPASE:5,AMYLASE:5 in the last 168 hours No results found for this basename: AMMONIA:5 in the last 168 hours CBC:  Lab 11/23/11 0833 11/21/11 1540 11/20/11 0141 11/18/11 0805  WBC 7.4 9.1 7.5 7.2  NEUTROABS 4.9 -- 4.2 3.9  HGB 8.6* 9.6* 9.5* 9.2*  HCT 28.4* 32.4* 30.8* 29.0*  MCV 75.3* 77.3* 76.4* 75.5*  PLT 162 190 193 163   Cardiac Enzymes:  Lab 11/21/11 1633  CKTOTAL --  CKMB --  CKMBINDEX --  TROPONINI <0.30   BNP: No components found with this basename: POCBNP:5 CBG:  Lab 11/23/11 0848 11/21/11 1523 11/21/11 1434 11/21/11 1142 11/19/11 2231  GLUCAP 131* 128* 116* 158* 114*    Radiological Exams on Admission: Dg Chest 2 View  11/21/2011  *RADIOLOGY REPORT*  Clinical Data: Shortness of breath, hypertension, and facial swelling.  CHEST - 2 VIEW  Comparison: Chest radiograph 10/15/2011, and multiple priors.  Findings: Right IJ dialysis catheter terminates in the distal superior vena cava.  Mild cardiomegaly is stable.  There is pulmonary vascular congestion.  No definite pulmonary edema.  There is slight blunting of the both costophrenic angles, new compared to a two-view chest radiograph of 07/12/2011.  Question very small bilateral pleural effusions.  IMPRESSION:  1.  Stable mild cardiomegaly with pulmonary vascular congestion. 2.  Suspect very small bilateral pleural effusion. 3.  Stable position of right IJ dialysis catheter.   Original Report Authenticated By: Britta Mccreedy, M.D.        Time spend:70 minutes     Jakelin Taussig, DO  [facility] Pager [phone]  If 7PM-7AM, please contact night-coverage [email or web address] Password [email or web address] 11/23/2011, 10:10 AM

## 2011-11-23 NOTE — ED Provider Notes (Signed)
History     CSN: [id number]  Arrival date & time 11/23/11  0522   None     Chief Complaint  Patient presents with  . Vascular Access Problem  . Facial Swelling    (Consider location/radiation/quality/duration/timing/severity/associated sxs/prior treatment) HPI Comments: 66 year old female presents requesting dialysis, feel that she is mildly fluid overloaded secondary to mild facial swelling. She denies any other complaints including chest pain, shortness of breath, wheezing, coughing or swelling of the lower extremities.  The history is provided by the patient.    Past Medical History  Diagnosis Date  . Chronic kidney disease     esrd  . Diabetes mellitus   . Hyperlipidemia   . Hypertension   . Renal disorder   . Dialysis care     tues, thurs, sat  . 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 Carotid endarterectomy 2002    left  . Btl   . Rectal fissurectomy   . Av fistula placement 10/30/2010    right brachiocephalic   . Fistulogram     Family History  Problem Relation Age of Onset  . COPD Mother   . Kidney disease Mother   . Heart disease Father   . Lymphoma Son     History  Substance Use Topics  . Smoking status: Former Smoker -- 1.0 packs/day for 50 years    Types: Cigarettes  . Smokeless tobacco: Never Used  . Alcohol Use: No    OB History    Grav Para Term Preterm Abortions TAB SAB Ect Mult Living                  Review of Systems  All other systems reviewed and are negative.    Allergies  Codeine; Epinephrine; Percocet; and Statins  Home Medications   Current Outpatient Rx  Name Route Sig Dispense Refill  . ALLOPURINOL 100 MG PO TABS Oral Take 100 mg by mouth daily.      Marland Kitchen AMLODIPINE BESYLATE 5 MG PO TABS Oral Take 10 mg by mouth daily.    Marland Kitchen CALCIUM ACETATE 667 MG PO CAPS Oral Take 667 mg by mouth 4 (four) times daily.     Marland Kitchen COENZYME Q10 150 MG PO CAPS Oral Take 300 mg by mouth daily. Hold while  in hospital    . DOCUSATE SODIUM 100 MG PO CAPS Oral Take 200 mg by mouth at bedtime.      Marland Kitchen HYDRALAZINE HCL 25 MG PO TABS Oral Take 50 mg by mouth 3 (three) times daily.     . L-METHYLFOLATE-B6-B12 3-35-2 MG PO TABS Oral Take 1 tablet by mouth 2 (two) times daily.     Marland Kitchen LOSARTAN POTASSIUM 50 MG PO TABS Oral Take 100 mg by mouth at bedtime.     Marland Kitchen METOPROLOL SUCCINATE ER 50 MG PO TB24 Oral Take 100 mg by mouth daily. Takes after dialysis (T, Th, Sat)    . RENA-VITE PO TABS Oral Take 1 tablet by mouth daily.      Marland Kitchen REPAGLINIDE 0.5 MG PO TABS Oral Take 0.5 mg by mouth 3 (three) times daily before meals.       BP 215/74  Pulse 95  Temp 97 F (36.1 C) (Oral)  Resp 16  SpO2 97%  LMP 09/27/2011  Physical Exam  Nursing note and vitals reviewed. Constitutional: She appears well-developed and well-nourished. No distress.  HENT:  Head: Normocephalic and atraumatic.  Mouth/Throat: Oropharynx is  clear and moist. No oropharyngeal exudate.  Eyes: Conjunctivae and EOM are normal. Pupils are equal, round, and reactive to light. Right eye exhibits no discharge. Left eye exhibits no discharge. No scleral icterus.  Neck: Normal range of motion. Neck supple. No JVD present. No thyromegaly present.  Cardiovascular: Normal rate, regular rhythm, normal heart sounds and intact distal pulses.  Exam reveals no gallop and no friction rub.   No murmur heard. Pulmonary/Chest: Effort normal. No respiratory distress. She has no wheezes. She has rales ( Very slight rales at the bases bilaterally).       No increased work of breathing, speaks in full sentences, no tachypnea, oxygen saturation of 97% on room air  Abdominal: Soft. Bowel sounds are normal. She exhibits no distension and no mass. There is no tenderness.  Musculoskeletal: Normal range of motion. She exhibits no edema and no tenderness.  Lymphadenopathy:    She has no cervical adenopathy.  Neurological: She is alert. Coordination normal.  Skin: Skin is  warm and dry. No rash noted. No erythema.  Psychiatric: She has a normal mood and affect. Her behavior is normal.    ED Course  Procedures (including critical care time)    1. Renal failure       MDM  Of note the patient is severely hypertensive though she is always hypertensive prior to dialysis. She has no signs of respiratory compromise despite having slight rales at the bases, no fevers or coughing. At this time we'll consult the hospitalist for admission to the hospital to arrange dialysis.  Pt has htn, clonidine ordered, d/w hospitalist - will admit for observation.       Vida Roller, MD 11/23/11 [phone]

## 2011-11-23 NOTE — ED Notes (Signed)
Pt states she is here for her regular dialysis.  She is concerned she will be late for her dialysis.

## 2011-11-23 NOTE — Consult Note (Signed)
  Pt. Ready for dialysis. Orders written.

## 2011-11-23 NOTE — ED Notes (Signed)
Triage RN spoke with Dr. Bascom Levels re: HD orders.

## 2011-11-24 ENCOUNTER — Encounter (HOSPITAL_COMMUNITY): Payer: Self-pay | Admitting: Emergency Medicine

## 2011-11-24 ENCOUNTER — Observation Stay (HOSPITAL_COMMUNITY)
Admission: EM | Admit: 2011-11-24 | Discharge: 2011-11-25 | Discharge status: Against Medical Advice | Payer: Medicare HMO | Attending: Internal Medicine | Admitting: Internal Medicine

## 2011-11-24 DIAGNOSIS — E119 Type 2 diabetes mellitus without complications: Secondary | ICD-10-CM | POA: Insufficient documentation

## 2011-11-24 DIAGNOSIS — D631 Anemia in chronic kidney disease: Secondary | ICD-10-CM | POA: Insufficient documentation

## 2011-11-24 DIAGNOSIS — I10 Essential (primary) hypertension: Secondary | ICD-10-CM

## 2011-11-24 DIAGNOSIS — Z992 Dependence on renal dialysis: Principal | ICD-10-CM | POA: Insufficient documentation

## 2011-11-24 DIAGNOSIS — I120 Hypertensive chronic kidney disease with stage 5 chronic kidney disease or end stage renal disease: Secondary | ICD-10-CM | POA: Insufficient documentation

## 2011-11-24 DIAGNOSIS — N186 End stage renal disease: Secondary | ICD-10-CM | POA: Insufficient documentation

## 2011-11-24 DIAGNOSIS — D638 Anemia in other chronic diseases classified elsewhere: Secondary | ICD-10-CM

## 2011-11-24 DIAGNOSIS — E1122 Type 2 diabetes mellitus with diabetic chronic kidney disease: Secondary | ICD-10-CM

## 2011-11-24 NOTE — ED Notes (Signed)
Checked CBG 124 

## 2011-11-24 NOTE — ED Notes (Signed)
Pt here for dialysis. Denies any complaints.

## 2011-11-24 NOTE — ED Notes (Signed)
DIALYSIS UNIT RN ( [facility]) NOTIFIED THAT PT. IS HERE FOR HERE FOR HER ROUTINE HEMODIALYSIS.

## 2011-11-24 NOTE — Discharge Summary (Signed)
Physician Discharge Summary  Patient ID: Alexandra [name] MRN: [id number] DOB/AGE: 11-04-1945 66 y.o.  Admit date: 11/19/2011 Discharge date: 11/24/2011  Admission Diagnoses:ESRD requiring HD  Discharge Diagnoses:  Active Problems:  Anemia of chronic disease  ESRD (end stage renal disease) on dialysis  HTN (hypertension), benign  H/O: gout   Discharged Condition: apparently stable  Hospital Course:  patient Is ESRD receiving HD 3 days a week via coming to [facility], and usually leaves immediately after finishing her HD prior to being seen By physician, and that what she did during this admission as well.  Consults:Nephrology  Significant Diagnostic Studies:   Treatments: hemodialysis  Discharge Exam: Blood pressure 191/76, pulse 81, temperature 97.5 F (36.4 C), temperature source Oral, resp. rate 19, weight 58.6 kg (129 lb 3 oz), last menstrual period 09/27/2011, SpO2 94.00%.   Disposition: 01-Home or Self Care     Medication List     As of 11/24/2011  1:41 PM    TAKE these medications         allopurinol 100 MG tablet   Commonly known as: ZYLOPRIM   Take 100 mg by mouth daily.      amLODipine 5 MG tablet   Commonly known as: NORVASC   Take 10 mg by mouth daily.      calcium acetate 667 MG capsule   Commonly known as: PHOSLO   Take 667 mg by mouth 4 (four) times daily.      Coenzyme Q10 150 MG Caps   Take 300 mg by mouth daily. Hold while in hospital      docusate sodium 100 MG capsule   Commonly known as: COLACE   Take 200 mg by mouth at bedtime.      hydrALAZINE 25 MG tablet   Commonly known as: APRESOLINE   Take 50 mg by mouth 3 (three) times daily.      l-methylfolate-B6-B12 3-35-2 MG Tabs   Commonly known as: METANX   Take 1 tablet by mouth 2 (two) times daily.      losartan 50 MG tablet   Commonly known as: COZAAR   Take 100 mg by mouth at bedtime.      metoprolol succinate 50 MG 24 hr tablet   Commonly known as: TOPROL-XL   Take  100 mg by mouth daily. Takes after dialysis (T, Th, Sat)      multivitamin Tabs tablet   Take 1 tablet by mouth daily.      repaglinide 0.5 MG tablet   Commonly known as: PRANDIN   Take 0.5 mg by mouth 3 (three) times daily before meals.       Follow with Primary MD [name], MD in 7 days   foolow up with your PCP and Nephrology physician after discharge.  Get Medicines reviewed and adjusted.  Please request your Prim.MD to go over all Hospital Tests and Procedure/Radiological results at the follow up, please get all Hospital records sent to your Prim MD by signing hospital release before you go home.  Activity: As tolerated with Full fall precautions use walker/cane & assistance as needed   Diet: renal  For Heart failure patients - Check your Weight same time everyday, if you gain over 2 pounds, or you develop in leg swelling, experience more shortness of breath or chest pain, call your Primary MD immediately. Follow Cardiac Low Salt Diet and 1.8 lit/day fluid restriction.  Disposition Home   If you experience worsening of your admission symptoms, develop shortness of  breath, life threatening emergency, suicidal or homicidal thoughts you must seek medical attention immediately by calling 911 or calling your MD immediately  if symptoms less severe.  You Must read complete instructions/literature along with all the possible adverse reactions/side effects for all the Medicines you take and that have been prescribed to you. Take any new Medicines after you have completely understood and accpet all the possible adverse reactions/side effects.   Do not drive and provide baby sitting services if your were admitted for syncope or siezures until you have seen by Primary MD or a Neurologist and advised to do so again.  Do not drive when taking Pain medications.    Do not take more than prescribed Pain, Sleep and Anxiety Medications  Special Instructions: If you have smoked or  chewed Tobacco  in the last 2 yrs please stop smoking, stop any regular Alcohol  and or any Recreational drug use.  Wear Seat belts while driving.   SignedRandol Kern, [facility] 11/24/2011, 1:41 PM

## 2011-11-25 ENCOUNTER — Inpatient Hospital Stay (HOSPITAL_COMMUNITY): Payer: Medicare HMO

## 2011-11-25 ENCOUNTER — Encounter (HOSPITAL_COMMUNITY): Payer: Self-pay | Admitting: Internal Medicine

## 2011-11-25 DIAGNOSIS — Z992 Dependence on renal dialysis: Secondary | ICD-10-CM

## 2011-11-25 DIAGNOSIS — E1129 Type 2 diabetes mellitus with other diabetic kidney complication: Secondary | ICD-10-CM

## 2011-11-25 DIAGNOSIS — N058 Unspecified nephritic syndrome with other morphologic changes: Secondary | ICD-10-CM

## 2011-11-25 DIAGNOSIS — D638 Anemia in other chronic diseases classified elsewhere: Secondary | ICD-10-CM

## 2011-11-25 DIAGNOSIS — N186 End stage renal disease: Secondary | ICD-10-CM

## 2011-11-25 DIAGNOSIS — I10 Essential (primary) hypertension: Secondary | ICD-10-CM

## 2011-11-25 LAB — CBC WITH DIFFERENTIAL/PLATELET
Basophils Relative: 0 % (ref 0–1)
Eosinophils Absolute: 0.2 10*3/uL (ref 0.0–0.7)
Eosinophils Relative: 3 % (ref 0–5)
HCT: 30.2 % — ABNORMAL LOW (ref 36.0–46.0)
Hemoglobin: 9.3 g/dL — ABNORMAL LOW (ref 12.0–15.0)
Lymphocytes Relative: 28 % (ref 12–46)
MCHC: 30.8 g/dL (ref 30.0–36.0)
Monocytes Relative: 14 % — ABNORMAL HIGH (ref 3–12)
Neutro Abs: 4.1 10*3/uL (ref 1.7–7.7)
Neutrophils Relative %: 55 % (ref 43–77)
RBC: 4.07 MIL/uL (ref 3.87–5.11)
Smear Review: ADEQUATE
WBC: 7.3 10*3/uL (ref 4.0–10.5)

## 2011-11-25 LAB — RENAL FUNCTION PANEL
CO2: 27 mEq/L (ref 19–32)
Calcium: 9.9 mg/dL (ref 8.4–10.5)
Chloride: 99 mEq/L (ref 96–112)
GFR calc Af Amer: 8 mL/min — ABNORMAL LOW (ref >90)
GFR calc non Af Amer: 7 mL/min — ABNORMAL LOW (ref >90)
Glucose, Bld: 81 mg/dL (ref 70–99)
Potassium: 3.9 mEq/L (ref 3.5–5.1)
Sodium: 138 mEq/L (ref 135–145)

## 2011-11-25 LAB — GLUCOSE, CAPILLARY: Glucose-Capillary: 96 mg/dL (ref 70–99)

## 2011-11-25 MED ORDER — EPOETIN ALFA 20000 UNIT/ML IJ SOLN
20000.0000 [IU] | Freq: Once | INTRAMUSCULAR | Status: AC
  Administered 2011-11-25: 20000 [IU] via SUBCUTANEOUS
  Filled 2011-11-25: qty 1

## 2011-11-25 MED ORDER — PARICALCITOL 5 MCG/ML IV SOLN
INTRAVENOUS | Status: AC
  Administered 2011-11-25: 1 ug via INTRAVENOUS
  Filled 2011-11-25: qty 1

## 2011-11-25 MED ORDER — PARICALCITOL 5 MCG/ML IV SOLN
1.0000 ug | Freq: Once | INTRAVENOUS | Status: AC
  Administered 2011-11-25: 1 ug via INTRAVENOUS
  Filled 2011-11-25: qty 0.2

## 2011-11-25 NOTE — ED Notes (Signed)
DIALYSIS UNIT NOTIFIED THAT PT. IS HERE AT FAST RACK RM . 10 , READY FOR HER HEMODIALYSIS.

## 2011-11-25 NOTE — H&P (Signed)
PCP:  [name], MD    Chief Complaint:   Here to get HD  HPI: Alexandra [name] is a 66 y.o. female   has a past medical history of Chronic kidney disease; Diabetes mellitus; Hyperlipidemia; Hypertension; Renal disorder; Dialysis care; and Gout due to renal impairment.   Presented with  Visit for receiving dialyses. Denies any complaints. Feeling fine.   Review of Systems:    Pertinent positives include: mild leg edema, and high blood pressures at home  Constitutional:  No weight loss, night sweats, Fevers, chills, fatigue, weight loss  HEENT:  No headaches, Difficulty swallowing,Tooth/dental problems,Sore throat,  No sneezing, itching, ear ache, nasal congestion, post nasal drip,  Cardio-vascular:  No chest pain, Orthopnea, PND, anasarca, dizziness, palpitations.no Bilateral lower extremity swelling  GI:  No heartburn, indigestion, abdominal pain, nausea, vomiting, diarrhea, change in bowel habits, loss of appetite, melena, blood in stool, hematemesis Resp:  no shortness of breath at rest. No dyspnea on exertion, No excess mucus, no productive cough, No non-productive cough, No coughing up of blood.No change in color of mucus.No wheezing. Skin:  no rash or lesions. No jaundice GU:  no dysuria, change in color of urine, no urgency or frequency. No straining to urinate.  No flank pain.  Musculoskeletal:  No joint pain or no joint swelling. No decreased range of motion. No back pain.  Psych:  No change in mood or affect. No depression or anxiety. No memory loss.  Neuro: no localizing neurological complaints, no tingling, no weakness, no double vision, no gait abnormality, no slurred speech, no confusion  Otherwise ROS are negative except for above, 10 systems were reviewed  Past Medical History: Past Medical History  Diagnosis Date  . Chronic kidney disease     esrd  . Diabetes mellitus   . Hyperlipidemia   . Hypertension   . Renal disorder   . Dialysis  care     tues, thurs, sat  . Gout due to renal impairment    Past Surgical History  Procedure Date  . Carotid endarterectomy 2002    left  . Btl   . Rectal fissurectomy   . Av fistula placement 10/30/2010    right brachiocephalic   . Fistulogram      Medications: Prior to Admission medications   Medication Sig Start Date End Date Taking? Authorizing Provider  allopurinol (ZYLOPRIM) 100 MG tablet Take 100 mg by mouth daily.     Yes Historical Provider, MD  amLODipine (NORVASC) 5 MG tablet Take 10 mg by mouth daily. 07/13/11  Yes [name], MD  benzonatate (TESSALON) 100 MG capsule Take 100 mg by mouth 3 (three) times daily as needed. cough   Yes Historical Provider, MD  calcium acetate (PHOSLO) 667 MG capsule Take 667 mg by mouth 4 (four) times daily.    Yes Historical Provider, MD  cloNIDine (CATAPRES - DOSED IN MG/24 HR) 0.1 mg/24hr patch Place 1 patch onto the skin once a week.   Yes Historical Provider, MD  cloNIDine (CATAPRES - DOSED IN MG/24 HR) 0.2 mg/24hr patch Place 1 patch onto the skin once a week. Will start 11/25/11 after dialysis   Yes Historical Provider, MD  Coenzyme Q10 150 MG CAPS Take 300 mg by mouth daily. Hold while in hospital   Yes Historical Provider, MD  docusate sodium (COLACE) 100 MG capsule Take 200 mg by mouth at bedtime.     Yes Historical Provider, MD  hydrALAZINE (APRESOLINE) 25 MG tablet Take 50 mg  by mouth 3 (three) times daily.  07/18/11  Yes [name], MD  l-methylfolate-B6-B12 (METANX) 3-35-2 MG TABS Take 1 tablet by mouth 2 (two) times daily.    Yes Historical Provider, MD  losartan (COZAAR) 100 MG tablet Take 100 mg by mouth 2 (two) times daily.   Yes Historical Provider, MD  metoprolol succinate (TOPROL-XL) 100 MG 24 hr tablet Take 50 mg by mouth 2 (two) times daily. On Tuesday, Thursday and Saturday, she waits until after dialysis to take this medication   Yes Historical Provider, MD  multivitamin (RENA-VIT) TABS tablet Take 1 tablet by mouth  daily.     Yes Historical Provider, MD  promethazine (PHENERGAN) 6.25 MG/5ML syrup Take 3.125-6.25 mg by mouth 4 (four) times daily as needed. cough   Yes Historical Provider, MD  repaglinide (PRANDIN) 0.5 MG tablet Take 0.5 mg by mouth 3 (three) times daily before meals.    Yes Historical Provider, MD    Allergies:   Allergies  Allergen Reactions  . Codeine Other (See Comments)    Passes out  . Epinephrine Other (See Comments)    Tachycardia, diaphoresis, syncope  . Percocet (Oxycodone-Acetaminophen) Other (See Comments)    Passes  out  . Statins Other (See Comments)    Liver enzymes rise   PATIENT DOES NOT WANT TO BE GIVEN THIS TYPE OF MEDICATION PER THE [facility]ISE OF HER [facility]    Social History:  Ambulatory  independently Lives at  home   reports that she has quit smoking. Her smoking use included Cigarettes. She has a 50 pack-year smoking history. She has never used smokeless tobacco. She reports that she does not drink alcohol or use illicit drugs.   Family History: family history includes COPD in her mother; Heart disease in her father; Kidney disease in her mother; and Lymphoma in her son.    Physical Exam: Patient Vitals for the past 24 hrs:  BP Temp Temp src Pulse Resp SpO2  11/24/11 2343 206/74 mmHg 97.5 F (36.4 C) Oral 82  18  97 %    1. General:  in No Acute distress 2. Psychological: sleepy but Oriented 3. Head/ENT:   MoistMucous Membranes                          Head Non traumatic, neck supple                          Normal  Dentition 4. SKIN: normal  Skin turgor,  Skin clean Dry and intact no rash 5. Heart: Regular rate and rhythm no Murmur, Rub or gallop 6. Lungs: Clear to auscultation bilaterally, no wheezes or crackles   7. Abdomen: Soft, non-tender, Non distended 8. Lower extremities: no clubbing, cyanosis, mild edema 9. Neurologically Grossly intact, moving all 4 extremities equally 10. MSK: Normal range of motion  body mass  index is unknown because there is no height or weight on file.   Labs on Admission:   [facility] 11/23/11 0833  NA 140  K 3.3*  CL 100  CO2 29  GLUCOSE 176*  BUN 36*  CREATININE 5.84*  CALCIUM 9.3  MG --  PHOS 2.6    Basename 11/23/11 0833  AST --  ALT --  ALKPHOS --  BILITOT --  PROT --  ALBUMIN 3.1*   No results found for this basename: LIPASE:2,AMYLASE:2 in the last 72 hours  Basename 11/23/11 0833  WBC  7.4  NEUTROABS 4.9  HGB 8.6*  HCT 28.4*  MCV 75.3*  PLT 162   No results found for this basename: CKTOTAL:3,CKMB:3,CKMBINDEX:3,TROPONINI:3 in the last 72 hours No results found for this basename: TSH,T4TOTAL,FREET3,T3FREE,THYROIDAB in the last 72 hours No results found for this basename: VITAMINB12:2,FOLATE:2,FERRITIN:2,TIBC:2,IRON:2,RETICCTPCT:2 in the last 72 hours Lab Results  Component Value Date   HGBA1C 5.4 08/02/2011    The CrCl is unknown because both a height and weight (above a minimum accepted value) are required for this calculation. ABG    Component Value Date/Time   PHART 7.342* 10/12/2011 0450   HCO3 35.7* 10/12/2011 0450   TCO2 38 10/12/2011 0450   O2SAT 99.0 10/12/2011 0450     No results found for this basename: DDIMER       Cultures:    Component Value Date/Time   SDES BLOOD HEMODIALYSIS CATHETER 10/13/2011 1415   SPECREQUEST BOTTLES DRAWN AEROBIC AND ANAEROBIC 10CC 10/13/2011 1415   CULT NO GROWTH 5 DAYS 10/13/2011 1415   REPTSTATUS 10/19/2011 FINAL 10/13/2011 1415       Radiological Exams on Admission: No results found.  Chart has been reviewed  Assessment/Plan  66 yo f w hx of ESRD and DM here to get her HD  Present on Admission:  .Anemia of chronic disease - stable , she is on epoetin shots .Diabetes mellitus with end stage renal disease - SSI .End stage renal disease - as per renal .HTN (hypertension), benign - improved after getting HD, hold off on treating until then   Prophylaxis: SCD CODE STATUS: FULL CODE  Other  plan as per orders.  I have spent a total of 35 min on this admission  Ezekial Arns 11/25/2011, 2:09 AM

## 2011-11-26 ENCOUNTER — Encounter (HOSPITAL_COMMUNITY): Payer: Self-pay | Admitting: Emergency Medicine

## 2011-11-26 DIAGNOSIS — E119 Type 2 diabetes mellitus without complications: Secondary | ICD-10-CM | POA: Insufficient documentation

## 2011-11-26 DIAGNOSIS — Z992 Dependence on renal dialysis: Secondary | ICD-10-CM | POA: Insufficient documentation

## 2011-11-26 DIAGNOSIS — I120 Hypertensive chronic kidney disease with stage 5 chronic kidney disease or end stage renal disease: Secondary | ICD-10-CM | POA: Insufficient documentation

## 2011-11-26 DIAGNOSIS — N186 End stage renal disease: Secondary | ICD-10-CM | POA: Insufficient documentation

## 2011-11-26 LAB — GLUCOSE, CAPILLARY: Glucose-Capillary: 100 mg/dL — ABNORMAL HIGH (ref 70–99)

## 2011-11-26 NOTE — ED Notes (Signed)
Pt to ED for HD; denies any increase in SOB; routine dialysis

## 2011-11-26 NOTE — ED Notes (Signed)
Notified IT sales professional in HD that pt has arrived to ED

## 2011-11-27 ENCOUNTER — Observation Stay (HOSPITAL_COMMUNITY): Payer: Medicare HMO

## 2011-11-27 ENCOUNTER — Observation Stay (HOSPITAL_COMMUNITY)
Admission: EM | Admit: 2011-11-27 | Discharge: 2011-11-27 | Disposition: A | Discharge status: Home / Self Care | Payer: Medicare HMO | Attending: Internal Medicine | Admitting: Internal Medicine

## 2011-11-27 DIAGNOSIS — E119 Type 2 diabetes mellitus without complications: Secondary | ICD-10-CM | POA: Diagnosis present

## 2011-11-27 DIAGNOSIS — D638 Anemia in other chronic diseases classified elsewhere: Secondary | ICD-10-CM | POA: Diagnosis present

## 2011-11-27 DIAGNOSIS — E785 Hyperlipidemia, unspecified: Secondary | ICD-10-CM | POA: Diagnosis present

## 2011-11-27 DIAGNOSIS — N186 End stage renal disease: Secondary | ICD-10-CM

## 2011-11-27 DIAGNOSIS — N2581 Secondary hyperparathyroidism of renal origin: Secondary | ICD-10-CM | POA: Diagnosis present

## 2011-11-27 DIAGNOSIS — I10 Essential (primary) hypertension: Secondary | ICD-10-CM | POA: Diagnosis present

## 2011-11-27 LAB — BASIC METABOLIC PANEL
BUN: 42 mg/dL — ABNORMAL HIGH (ref 6–23)
Chloride: 98 mEq/L (ref 96–112)
GFR calc Af Amer: 7 mL/min — ABNORMAL LOW (ref >90)
GFR calc non Af Amer: 6 mL/min — ABNORMAL LOW (ref >90)
Potassium: 4 mEq/L (ref 3.5–5.1)
Sodium: 138 mEq/L (ref 135–145)

## 2011-11-27 LAB — CBC
HCT: 29.3 % — ABNORMAL LOW (ref 36.0–46.0)
Hemoglobin: 8.9 g/dL — ABNORMAL LOW (ref 12.0–15.0)
MCHC: 30.4 g/dL (ref 30.0–36.0)
RBC: 3.95 MIL/uL (ref 3.87–5.11)

## 2011-11-27 LAB — ALBUMIN: Albumin: 3.2 g/dL — ABNORMAL LOW (ref 3.5–5.2)

## 2011-11-27 LAB — GLUCOSE, CAPILLARY: Glucose-Capillary: 104 mg/dL — ABNORMAL HIGH (ref 70–99)

## 2011-11-27 LAB — HEPATITIS B SURFACE ANTIGEN: Hepatitis B Surface Ag: NEGATIVE

## 2011-11-27 MED ORDER — SODIUM CHLORIDE 0.9 % IJ SOLN: 3.0000 mL | INTRAMUSCULAR | Status: DC | PRN

## 2011-11-27 MED ORDER — SODIUM CHLORIDE 0.9 % IJ SOLN: 3.0000 mL | Freq: Two times a day (BID) | INTRAMUSCULAR | Status: DC

## 2011-11-27 MED ORDER — OXYCODONE HCL 5 MG PO TABS: 5.0000 mg | ORAL_TABLET | ORAL | Status: DC | PRN

## 2011-11-27 MED ORDER — ACETAMINOPHEN 650 MG RE SUPP: 650.0000 mg | Freq: Four times a day (QID) | RECTAL | Status: DC | PRN

## 2011-11-27 MED ORDER — SODIUM CHLORIDE 0.9 % IV SOLN: 250.0000 mL | INTRAVENOUS | Status: DC | PRN

## 2011-11-27 MED ORDER — ENOXAPARIN SODIUM 30 MG/0.3ML ~~LOC~~ SOLN
30.0000 mg | SUBCUTANEOUS | Status: DC
  Filled 2011-11-27: qty 0.3

## 2011-11-27 MED ORDER — ONDANSETRON HCL 4 MG PO TABS: 4.0000 mg | ORAL_TABLET | Freq: Four times a day (QID) | ORAL | Status: DC | PRN

## 2011-11-27 MED ORDER — PARICALCITOL 5 MCG/ML IV SOLN
INTRAVENOUS | Status: AC
  Administered 2011-11-27: 1 ug via INTRAVENOUS
  Filled 2011-11-27: qty 1

## 2011-11-27 MED ORDER — HYDRALAZINE HCL 20 MG/ML IJ SOLN
5.0000 mg | Freq: Four times a day (QID) | INTRAMUSCULAR | Status: DC | PRN
  Filled 2011-11-27: qty 0.25

## 2011-11-27 MED ORDER — ONDANSETRON HCL 4 MG/2ML IJ SOLN: 4.0000 mg | Freq: Four times a day (QID) | INTRAMUSCULAR | Status: DC | PRN

## 2011-11-27 MED ORDER — EPOETIN ALFA 20000 UNIT/ML IJ SOLN
20000.0000 [IU] | Freq: Once | INTRAMUSCULAR | Status: AC
  Administered 2011-11-27: 20000 [IU] via SUBCUTANEOUS
  Filled 2011-11-27: qty 1

## 2011-11-27 MED ORDER — ZOLPIDEM TARTRATE 5 MG PO TABS: 5.0000 mg | ORAL_TABLET | Freq: Every evening | ORAL | Status: DC | PRN

## 2011-11-27 MED ORDER — HYDROMORPHONE HCL PF 1 MG/ML IJ SOLN: 0.5000 mg | INTRAMUSCULAR | Status: DC | PRN

## 2011-11-27 MED ORDER — PARICALCITOL 5 MCG/ML IV SOLN
1.0000 ug | Freq: Once | INTRAVENOUS | Status: AC
  Administered 2011-11-27: 1 ug via INTRAVENOUS
  Filled 2011-11-27: qty 0.2

## 2011-11-27 MED ORDER — INSULIN ASPART 100 UNIT/ML ~~LOC~~ SOLN: 0.0000 [IU] | SUBCUTANEOUS | Status: DC

## 2011-11-27 MED ORDER — ACETAMINOPHEN 325 MG PO TABS: 650.0000 mg | ORAL_TABLET | Freq: Four times a day (QID) | ORAL | Status: DC | PRN

## 2011-11-27 NOTE — ED Notes (Signed)
Spoke with P,. Dammen PA, okay with seeing pt in Fast track

## 2011-11-27 NOTE — ED Provider Notes (Signed)
Medical screening examination/treatment/procedure(s) were conducted as a shared visit with non-physician practitioner(s) and myself.  I personally evaluated the patient during the encounter  Scheduled dialysis. No acute complaints. Vitals normal  Lyanne Co, MD 11/27/11 2239

## 2011-11-27 NOTE — ED Notes (Signed)
Dr. Lovell Sheehan in to place admissions orders.  Pt to dialysis

## 2011-11-27 NOTE — ED Provider Notes (Signed)
History     CSN: [id number]  Arrival date & time 11/26/11  2326   First MD Initiated Contact with Patient 11/27/11 0109      Chief Complaint  Patient presents with  . Needs Dialysis    HPI  History provided by the patient. Patient is a 66 year old female with history of hypertension, hyperlipidemia, diabetes, end-stage renal disease on dialysis who has frequent regular visits to emergency room to receive dialysis. Patient presents today with requests for routine dialysis. She denies any chest pain or shortness of breath. Patient states this will be her last time to the emergency room for dialysis as she has arranged outpatient dialysis in the area. She denies any recent illness, fever, chills or sweats.    Past Medical History  Diagnosis Date  . Chronic kidney disease     esrd  . Diabetes mellitus   . Hyperlipidemia   . Hypertension   . Renal disorder   . Dialysis care     tues, thurs, sat  . 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 Carotid endarterectomy 2002    left  . Btl   . Rectal fissurectomy   . Av fistula placement 10/30/2010    right brachiocephalic   . Fistulogram     Family History  Problem Relation Age of Onset  . COPD Mother   . Kidney disease Mother   . Heart disease Father   . Lymphoma Son     History  Substance Use Topics  . Smoking status: Former Smoker -- 1.0 packs/day for 50 years    Types: Cigarettes  . Smokeless tobacco: Never Used  . Alcohol Use: No    OB History    Grav Para Term Preterm Abortions TAB SAB Ect Mult Living                  Review of Systems  Constitutional: Negative for fever and chills.  Respiratory: Negative for shortness of breath.   Cardiovascular: Negative for chest pain.    Allergies  Codeine; Epinephrine; Percocet; and Statins  Home Medications   Current Outpatient Rx  Name Route Sig Dispense Refill  . ALLOPURINOL 100 MG PO TABS Oral Take 100 mg by mouth daily.       Marland Kitchen AMLODIPINE BESYLATE 5 MG PO TABS Oral Take 10 mg by mouth daily.    Marland Kitchen BENZONATATE 100 MG PO CAPS Oral Take 100 mg by mouth 3 (three) times daily as needed. cough    . CALCIUM ACETATE 667 MG PO CAPS Oral Take 667 mg by mouth 4 (four) times daily.     Marland Kitchen CLONIDINE HCL 0.1 MG/24HR TD PTWK Transdermal Place 1 patch onto the skin once a week.    Marland Kitchen CLONIDINE HCL 0.2 MG/24HR TD PTWK Transdermal Place 1 patch onto the skin once a week. Will start 11/25/11 after dialysis    . COENZYME Q10 150 MG PO CAPS Oral Take 300 mg by mouth daily. Hold while in hospital    . DOCUSATE SODIUM 100 MG PO CAPS Oral Take 200 mg by mouth at bedtime.      Marland Kitchen HYDRALAZINE HCL 25 MG PO TABS Oral Take 50 mg by mouth 3 (three) times daily.     . L-METHYLFOLATE-B6-B12 3-35-2 MG PO TABS Oral Take 1 tablet by mouth 2 (two) times daily.     Marland Kitchen LOSARTAN POTASSIUM 100 MG PO TABS Oral Take 100 mg by mouth 2 (two) times  daily.    Marland Kitchen METOPROLOL SUCCINATE ER 100 MG PO TB24 Oral Take 50 mg by mouth 2 (two) times daily. On Tuesday, Thursday and Saturday, she waits until after dialysis to take this medication    . RENA-VITE PO TABS Oral Take 1 tablet by mouth daily.      Marland Kitchen PROMETHAZINE HCL 6.25 MG/5ML PO SYRP Oral Take 3.125-6.25 mg by mouth 4 (four) times daily as needed. cough    . REPAGLINIDE 0.5 MG PO TABS Oral Take 0.5 mg by mouth 3 (three) times daily before meals.       BP 209/60  Pulse 82  Temp 97.9 F (36.6 C) (Oral)  Resp 17  SpO2 95%  LMP 09/27/2011  Physical Exam  Nursing note and vitals reviewed. Constitutional: She is oriented to person, place, and time. She appears well-developed and well-nourished. No distress.  HENT:  Head: Normocephalic.  Cardiovascular: Normal rate and regular rhythm.   Pulmonary/Chest: Effort normal and breath sounds normal. No respiratory distress. She has no wheezes.  Neurological: She is alert and oriented to person, place, and time.  Skin: Skin is warm and dry.  Psychiatric: She has a  normal mood and affect. Her behavior is normal.    ED Course  Procedures   Results for orders placed during the hospital encounter of 11/27/11  GLUCOSE, CAPILLARY      Component Value Range   Glucose-Capillary 100 (*) 70 - 99 mg/dL       1. End stage renal disease       MDM  1:15AM patient seen and evaluated. Patient with no complaints aside from requesting regular dialysis.   Spoke with [facility] hospitalist. They will see patient and admit for dialysis.    Angus Seller, [place] 11/27/11 [phone]

## 2011-11-27 NOTE — Progress Notes (Signed)
Pt. Has no admission orders. Not able to accept to hemodialysis per facility/unit protocol.

## 2011-11-27 NOTE — H&P (Signed)
[facility] History and Physical  Alexandra [name] ZOX:[id number] DOB: 05/15/1945 DOA: 11/27/2011  Referring physician: EDP PCP: [name], MD   Chief Complaint: Here for Dialysis  HPI: Alexandra [name] is a 67 y.o. 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  She is without complaints.     Review of Systems: The patient denies anorexia, fever, weight loss,, vision loss, decreased hearing, hoarseness, chest pain, syncope, dyspnea on exertion, peripheral edema, balance deficits, hemoptysis, abdominal pain, melena, hematochezia, severe indigestion/heartburn, hematuria, incontinence, genital sores, muscle weakness, suspicious skin lesions, transient blindness, difficulty walking, depression, unusual weight change, abnormal bleeding, enlarged lymph nodes, angioedema, and breast masses.    Past Medical History  Diagnosis Date  . Chronic kidney disease     esrd  . Diabetes mellitus   . Hyperlipidemia   . Hypertension   . Renal disorder   . Dialysis care     tues, thurs, sat  . Gout due to renal impairment    Past Surgical History  Procedure Date  . Carotid endarterectomy 2002    left  . Btl   . Rectal fissurectomy   . Av fistula placement 10/30/2010    right brachiocephalic   . Fistulogram    Social History:  reports that she has quit smoking. Her smoking use included Cigarettes. She has a 50 pack-year smoking history. She has never used smokeless tobacco. She reports that she does not drink alcohol or use illicit drugs.   Allergies  Allergen Reactions  . Codeine Other (See Comments)    Passes out  . Epinephrine Other (See Comments)    Tachycardia, diaphoresis, syncope  . Percocet (Oxycodone-Acetaminophen) Other (See Comments)    Passes  out  . Statins Other (See Comments)    Liver enzymes rise   PATIENT DOES NOT WANT TO BE GIVEN THIS TYPE OF MEDICATION PER THE ADVISE OF HER [facility]    Family  History  Problem Relation Age of Onset  . COPD Mother   . Kidney disease Mother   . Heart disease Father   . Lymphoma Son      Prior to Admission medications   Medication Sig Start Date End Date Taking? Authorizing Provider  allopurinol (ZYLOPRIM) 100 MG tablet Take 100 mg by mouth daily.     Yes Historical Provider, MD  amLODipine (NORVASC) 5 MG tablet Take 10 mg by mouth daily. 07/13/11  Yes [name], MD  benzonatate (TESSALON) 100 MG capsule Take 100 mg by mouth 3 (three) times daily as needed. cough   Yes Historical Provider, MD  calcium acetate (PHOSLO) 667 MG capsule Take 667 mg by mouth 4 (four) times daily.    Yes Historical Provider, MD  cloNIDine (CATAPRES - DOSED IN MG/24 HR) 0.3 mg/24hr Place 1 patch onto the skin once a week.   Yes Historical Provider, MD  Coenzyme Q10 150 MG CAPS Take 300 mg by mouth daily. Hold while in hospital   Yes Historical Provider, MD  docusate sodium (COLACE) 100 MG capsule Take 200 mg by mouth at bedtime.     Yes Historical Provider, MD  hydrALAZINE (APRESOLINE) 25 MG tablet Take 50 mg by mouth 3 (three) times daily.  07/18/11  Yes [name], MD  l-methylfolate-B6-B12 (METANX) 3-35-2 MG TABS Take 1 tablet by mouth 2 (two) times daily.    Yes Historical Provider, MD  losartan (COZAAR) 100 MG tablet Take 100 mg by mouth 2 (two)  times daily.   Yes Historical Provider, MD  metoprolol succinate (TOPROL-XL) 100 MG 24 hr tablet Take 50 mg by mouth 2 (two) times daily. On Tuesday, Thursday and Saturday, she waits until after dialysis to take this medication   Yes Historical Provider, MD  multivitamin (RENA-VIT) TABS tablet Take 1 tablet by mouth daily.     Yes Historical Provider, MD  promethazine (PHENERGAN) 6.25 MG/5ML syrup Take 3.125-6.25 mg by mouth 4 (four) times daily as needed. cough   Yes Historical Provider, MD  repaglinide (PRANDIN) 0.5 MG tablet Take 0.5 mg by mouth 3 (three) times daily before meals.    Yes Historical Provider, MD   Review  of Systems:  The patient denies anorexia, fever, weight loss, vision loss, decreased hearing, hoarseness, chest pain, syncope, dyspnea on exertion, peripheral edema, balance deficits, hemoptysis, abdominal pain, melena, hematochezia, severe indigestion/heartburn, hematuria, incontinence, genital sores, muscle weakness, suspicious skin lesions, transient blindness, difficulty walking, depression, unusual weight change, abnormal bleeding, enlarged lymph nodes, angioedema, and breast masses.   Physical Exam:  GEN:  Pleasant examined  and in no acute distress; cooperative with exam Filed Vitals:  11/26/11 2330 BP: 209/60 Pulse: 82 Temp: 97.9 F (36.6 C) TempSrc: Oral Resp: 17 SpO2: 95%  Blood pressure 209/60, pulse 82, temperature 97.9 F (36.6 C), temperature source Oral, resp. rate 17, last menstrual period 09/27/2011, SpO2 95.00%. PSYCH: She is alert and oriented x4; does not appear anxious does not appear depressed; affect is normal HEENT: Normocephalic and Atraumatic, Mucous membranes pink; PERRLA; EOM intact; Fundi:  Benign;  No scleral icterus, Nares: Patent, Oropharynx: Clear,Poor Dentition, Neck:  FROM, no cervical lymphadenopathy nor thyromegaly or carotid bruit; no JVD; Breasts:: Not examined CHEST WALL: No tenderness CHEST: Normal respiration, clear to auscultation bilaterally HEART: Regular rate and rhythm; no murmurs rubs or gallops BACK: No kyphosis or scoliosis; no CVA tenderness ABDOMEN: Positive Bowel Sounds, Scaphoid,soft Nontender, NonDistended No HSM Rectal Exam: Not done EXTREMITIES: No bone or joint deformity; age-appropriate arthropathy of the hands and knees; no cyanosis, clubbing or edema; no ulcerations. Genitalia: not examined PULSES: 2+ and symmetric SKIN: Normal hydration no rash or ulceration CNS: Cranial nerves 2-12 grossly intact no focal neurologic deficit   Labs on Admission:  Basic Metabolic Panel:  Lab 11/25/11 1610 11/23/11 0833 11/21/11 1540  NA  138 140 137  K 3.9 3.3* 4.7  CL 99 100 98  CO2 27 29 28   GLUCOSE 81 176* 125*  BUN 38* 36* 30*  CREATININE 5.66* 5.84* 5.33*  CALCIUM 9.9 9.3 10.1  MG -- -- --  PHOS 2.4 2.6 2.0*   Liver Function Tests:  Lab 11/25/11 0341 11/23/11 0833 11/21/11 1540  AST -- -- --  ALT -- -- --  ALKPHOS -- -- --  BILITOT -- -- --  PROT -- -- --  ALBUMIN 3.2* 3.1* 3.4*   No results found for this basename: LIPASE:5,AMYLASE:5 in the last 168 hours No results found for this basename: AMMONIA:5 in the last 168 hours CBC:  Lab 11/25/11 0342 11/23/11 0833 11/21/11 1540  WBC 7.3 7.4 9.1  NEUTROABS 4.1 4.9 --  HGB 9.3* 8.6* 9.6*  HCT 30.2* 28.4* 32.4*  MCV 74.2* 75.3* 77.3*  PLT 218 162 190   Cardiac Enzymes:  Lab 11/21/11 1633  CKTOTAL --  CKMB --  CKMBINDEX --  TROPONINI <0.30    BNP (last 3 results)  Basename 10/15/11 0707  PROBNP 31669.0*   CBG:  Lab 11/26/11 2334 11/25/11 0052 11/24/11 2338 11/23/11  0848 11/21/11 1523  GLUCAP 100* 96 124* 131* 128*    Radiological Exams on Admission: No results found.  EKG: Independently reviewed.   Assessment: Principal Problem:  *ESRD (end stage renal disease) on dialysis Active Problems:  Hyperparathyroidism, secondary renal  Hyperlipidemia  Anemia of chronic disease  Diabetes mellitus  HTN (hypertension), malignant   Plan:    Admit for 23 hour Obs for Dialysis Treatment Medications Reviewed and Reconciled SSI coverage PRN DVT prophylaxis    Code Status: FULL CODE Family Communication: N/A Disposition Plan:   Time spent: 33 MINUTES  Ron Parker [facility] Pager [phone]  If 7PM-7AM, please contact night-coverage [email or web address] Password [email or web address] 11/27/2011, 3:05 AM

## 2011-12-02 NOTE — Discharge Summary (Signed)
Physician Discharge Summary  Alexandra [name] ZOX:[id number] DOB: 04/25/1945 DOA: 11/11/2011  PCP: [name], MD  Admit date: 11/11/2011 Discharge date: 11/11/2011  1. [name] after dialysis.    Discharge Diagnoses:  Active Problems:  Anemia of chronic disease  Diabetes mellitus with end stage renal disease  HTN (hypertension), malignant  End stage renal disease   Discharge Condition:  unknown  Diet recommendation: renal  Wt Readings from Last 3 Encounters:  11/27/11 58.6 kg (129 lb 3 oz)  11/25/11 57.4 kg (126 lb 8.7 oz)  11/23/11 58.1 kg (128 lb 1.4 oz)    History of present illness:   Alexandra [name] is a 66 y.o. female  has a past medical history of Chronic kidney disease; Diabetes mellitus; Hyperlipidemia; Hypertension; Renal disorder; Dialysis care; and Gout due to renal impairment.  Presented with  Here to gt dialyzed. Patient is sleeping and does not answer much of my questions. But does state that nothing has changed and that she feels fine.    Hospital Course:   The [name] immediately after receiving hemodialysis.    Procedures:  HD  Consultations:  Nephrology  Discharge Exam: Filed Vitals:   11/11/11 1153  BP: 163/70  Pulse: 91  Temp: 97.5 F (36.4 C)  Resp: 18   Filed Vitals:   11/11/11 1030 11/11/11 1100 11/11/11 1130 11/11/11 1153  BP: 165/67 168/61 137/59 163/70  Pulse: 87 92 93 91  Temp:   97.5 F (36.4 C) 97.5 F (36.4 C)  TempSrc:   Oral Oral  Resp:   18 18  Weight:    57.4 kg (126 lb 8.7 oz)  SpO2:   98% 98%    Exam:  Not completed as [name] immediately after HD and before exam could be completed.  Discharge Instructions     Medication List     As of 12/02/2011 10:06 AM    ASK your doctor about these medications         allopurinol 100 MG tablet   Commonly known as: ZYLOPRIM   Take 100 mg by mouth daily.      amLODipine 5 MG tablet   Commonly known as: NORVASC   Take 10  mg by mouth daily.      calcium acetate 667 MG capsule   Commonly known as: PHOSLO   Take 667 mg by mouth 4 (four) times daily.      Coenzyme Q10 150 MG Caps   Take 300 mg by mouth daily. Hold while in hospital      docusate sodium 100 MG capsule   Commonly known as: COLACE   Take 200 mg by mouth at bedtime.      hydrALAZINE 25 MG tablet   Commonly known as: APRESOLINE   Take 50 mg by mouth 3 (three) times daily.      l-methylfolate-B6-B12 3-35-2 MG Tabs   Commonly known as: METANX   Take 1 tablet by mouth 2 (two) times daily.      multivitamin Tabs tablet   Take 1 tablet by mouth daily.      repaglinide 0.5 MG tablet   Commonly known as: PRANDIN   Take 0.5 mg by mouth 3 (three) times daily before meals.           The results of significant diagnostics from this hospitalization (including imaging, microbiology, ancillary and laboratory) are listed below for reference.    Significant Diagnostic Studies: Dg Chest 2 View  11/21/2011  *RADIOLOGY  REPORT*  Clinical Data: Shortness of breath, hypertension, and facial swelling.  CHEST - 2 VIEW  Comparison: Chest radiograph 10/15/2011, and multiple priors.  Findings: Right IJ dialysis catheter terminates in the distal superior vena cava.  Mild cardiomegaly is stable.  There is pulmonary vascular congestion.  No definite pulmonary edema.  There is slight blunting of the both costophrenic angles, new compared to a two-view chest radiograph of 07/12/2011.  Question very small bilateral pleural effusions.  IMPRESSION:  1.  Stable mild cardiomegaly with pulmonary vascular congestion. 2.  Suspect very small bilateral pleural effusion. 3.  Stable position of right IJ dialysis catheter.   Original Report Authenticated By: Britta Mccreedy, M.D.    Dg Forearm Right  11/11/2011  *RADIOLOGY REPORT*  Clinical Data: Swelling and arm pain  RIGHT FOREARM - 2 VIEW  Comparison: None.  Findings: Postoperative changes are noted about the elbow joint. Diffuse  soft tissue edema is noted.  No acute bony abnormality is seen.  IMPRESSION: Soft tissue swelling without acute bony abnormality.   Original Report Authenticated By: Phillips Odor, M.D.    Dg Hand 2 View Right  11/11/2011  *RADIOLOGY REPORT*  Clinical Data: Right hand swelling  RIGHT HAND - 2 VIEW  Comparison: None.  Findings:  Mild degenerate changes are noted in the interphalangeal joints.  No acute fracture or dislocation is seen.  Generalized soft tissue swelling is noted about the metacarpals.  IMPRESSION: Generalized soft tissue swelling without acute bony abnormality.   Original Report Authenticated By: Phillips Odor, M.D.     Microbiology: No results found for this or any previous visit (from the past 240 hour(s)).   Labs: Basic Metabolic Panel:  Lab 11/27/11 1610  NA 138  K 4.0  CL 98  CO2 28  GLUCOSE 203*  BUN 42*  CREATININE 6.41*  CALCIUM 9.8  MG --  PHOS 2.5   Liver Function Tests:  Lab 11/27/11 0428  AST --  ALT --  ALKPHOS --  BILITOT --  PROT --  ALBUMIN 3.2*   No results found for this basename: LIPASE:5,AMYLASE:5 in the last 168 hours No results found for this basename: AMMONIA:5 in the last 168 hours CBC:  Lab 11/27/11 0428  WBC 6.9  NEUTROABS --  HGB 8.9*  HCT 29.3*  MCV 74.2*  PLT 201   Cardiac Enzymes: No results found for this basename: CKTOTAL:5,CKMB:5,CKMBINDEX:5,TROPONINI:5 in the last 168 hours BNP: BNP (last 3 results)  Basename 10/15/11 0707  PROBNP 31669.0*   CBG:  Lab 11/27/11 0719 11/26/11 2334  GLUCAP 104* 100*    Time coordinating discharge: 0 minutes  Signed:  Graycen Sadlon  [facility] 12/02/2011, 10:06 AM

## 2011-12-03 NOTE — Discharge Summary (Signed)
I agree with above.  Admitted for routine dialysis and generally goes home after successful dialysis.

## 2012-09-14 ENCOUNTER — Inpatient Hospital Stay (HOSPITAL_COMMUNITY)
Admission: EM | Admit: 2012-09-14 | Discharge: 2012-09-16 | DRG: 682 | Disposition: A | Discharge status: Home / Self Care | Payer: Medicare HMO | Attending: Internal Medicine | Admitting: Internal Medicine

## 2012-09-14 ENCOUNTER — Other Ambulatory Visit: Payer: Self-pay

## 2012-09-14 ENCOUNTER — Encounter (HOSPITAL_COMMUNITY): Payer: Self-pay | Admitting: Adult Health

## 2012-09-14 ENCOUNTER — Emergency Department (HOSPITAL_COMMUNITY): Payer: Medicare HMO

## 2012-09-14 DIAGNOSIS — N2581 Secondary hyperparathyroidism of renal origin: Secondary | ICD-10-CM

## 2012-09-14 DIAGNOSIS — I5033 Acute on chronic diastolic (congestive) heart failure: Secondary | ICD-10-CM | POA: Diagnosis present

## 2012-09-14 DIAGNOSIS — I120 Hypertensive chronic kidney disease with stage 5 chronic kidney disease or end stage renal disease: Principal | ICD-10-CM | POA: Diagnosis present

## 2012-09-14 DIAGNOSIS — E119 Type 2 diabetes mellitus without complications: Secondary | ICD-10-CM | POA: Diagnosis present

## 2012-09-14 DIAGNOSIS — J811 Chronic pulmonary edema: Secondary | ICD-10-CM

## 2012-09-14 DIAGNOSIS — J449 Chronic obstructive pulmonary disease, unspecified: Secondary | ICD-10-CM | POA: Diagnosis present

## 2012-09-14 DIAGNOSIS — J9600 Acute respiratory failure, unspecified whether with hypoxia or hypercapnia: Secondary | ICD-10-CM

## 2012-09-14 DIAGNOSIS — M109 Gout, unspecified: Secondary | ICD-10-CM | POA: Diagnosis present

## 2012-09-14 DIAGNOSIS — Z992 Dependence on renal dialysis: Secondary | ICD-10-CM

## 2012-09-14 DIAGNOSIS — Z8739 Personal history of other diseases of the musculoskeletal system and connective tissue: Secondary | ICD-10-CM

## 2012-09-14 DIAGNOSIS — I5032 Chronic diastolic (congestive) heart failure: Secondary | ICD-10-CM

## 2012-09-14 DIAGNOSIS — E785 Hyperlipidemia, unspecified: Secondary | ICD-10-CM

## 2012-09-14 DIAGNOSIS — Z87891 Personal history of nicotine dependence: Secondary | ICD-10-CM

## 2012-09-14 DIAGNOSIS — N186 End stage renal disease: Secondary | ICD-10-CM | POA: Diagnosis present

## 2012-09-14 DIAGNOSIS — I10 Essential (primary) hypertension: Secondary | ICD-10-CM | POA: Diagnosis present

## 2012-09-14 DIAGNOSIS — J4489 Other specified chronic obstructive pulmonary disease: Secondary | ICD-10-CM | POA: Diagnosis present

## 2012-09-14 DIAGNOSIS — I509 Heart failure, unspecified: Secondary | ICD-10-CM | POA: Diagnosis present

## 2012-09-14 DIAGNOSIS — Z720 Tobacco use: Secondary | ICD-10-CM

## 2012-09-14 DIAGNOSIS — J810 Acute pulmonary edema: Secondary | ICD-10-CM | POA: Diagnosis present

## 2012-09-14 DIAGNOSIS — D631 Anemia in chronic kidney disease: Secondary | ICD-10-CM

## 2012-09-14 DIAGNOSIS — M7989 Other specified soft tissue disorders: Secondary | ICD-10-CM

## 2012-09-14 HISTORY — DX: Dependence on renal dialysis: Z99.2

## 2012-09-14 HISTORY — DX: Cerebral infarction, unspecified: I63.9

## 2012-09-14 HISTORY — DX: End stage renal disease: N18.6

## 2012-09-14 LAB — CBC
HCT: 30.1 % — ABNORMAL LOW (ref 36.0–46.0)
HCT: 28.1 % — ABNORMAL LOW (ref 36.0–46.0)
Hemoglobin: 9.2 g/dL — ABNORMAL LOW (ref 12.0–15.0)
MCH: 30.2 pg (ref 26.0–34.0)
MCHC: 32.2 g/dL (ref 30.0–36.0)
MCV: 91.8 fL (ref 78.0–100.0)
MCV: 92.1 fL (ref 78.0–100.0)
Platelets: 171 10*3/uL (ref 150–400)
Platelets: 165 10*3/uL (ref 150–400)
RBC: 3.05 MIL/uL — ABNORMAL LOW (ref 3.87–5.11)
RDW: 16.6 % — ABNORMAL HIGH (ref 11.5–15.5)
WBC: 7.7 10*3/uL (ref 4.0–10.5)

## 2012-09-14 LAB — POCT I-STAT TROPONIN I: Troponin i, poc: 0.06 ng/mL (ref 0.00–0.08)

## 2012-09-14 LAB — BASIC METABOLIC PANEL
BUN: 35 mg/dL — ABNORMAL HIGH (ref 6–23)
Creatinine, Ser: 6.63 mg/dL — ABNORMAL HIGH (ref 0.50–1.10)
GFR calc Af Amer: 7 mL/min — ABNORMAL LOW (ref >90)
GFR calc non Af Amer: 6 mL/min — ABNORMAL LOW (ref >90)

## 2012-09-14 LAB — CREATININE, SERUM: GFR calc Af Amer: 6 mL/min — ABNORMAL LOW (ref >90)

## 2012-09-14 LAB — GLUCOSE, CAPILLARY: Glucose-Capillary: 193 mg/dL — ABNORMAL HIGH (ref 70–99)

## 2012-09-14 MED ORDER — AMLODIPINE BESYLATE 10 MG PO TABS
10.0000 mg | ORAL_TABLET | Freq: Every day | ORAL | Status: DC
  Administered 2012-09-14: 10 mg via ORAL
  Filled 2012-09-14 (×2): qty 1

## 2012-09-14 MED ORDER — PENTAFLUOROPROP-TETRAFLUOROETH EX AERO: 1.0000 "application " | INHALATION_SPRAY | CUTANEOUS | Status: DC | PRN

## 2012-09-14 MED ORDER — METOPROLOL SUCCINATE ER 50 MG PO TB24
50.0000 mg | ORAL_TABLET | ORAL | Status: DC
  Filled 2012-09-14 (×2): qty 1

## 2012-09-14 MED ORDER — CLONIDINE HCL 0.3 MG/24HR TD PTWK
0.3000 mg | MEDICATED_PATCH | TRANSDERMAL | Status: DC
  Filled 2012-09-14: qty 1

## 2012-09-14 MED ORDER — ENOXAPARIN SODIUM 30 MG/0.3ML ~~LOC~~ SOLN
30.0000 mg | SUBCUTANEOUS | Status: DC
  Administered 2012-09-14 – 2012-09-16 (×3): 30 mg via SUBCUTANEOUS
  Filled 2012-09-14 (×3): qty 0.3

## 2012-09-14 MED ORDER — ONDANSETRON HCL 4 MG/2ML IJ SOLN: 4.0000 mg | Freq: Four times a day (QID) | INTRAMUSCULAR | Status: DC | PRN

## 2012-09-14 MED ORDER — SODIUM CHLORIDE 0.9 % IV SOLN: 100.0000 mL | INTRAVENOUS | Status: DC | PRN

## 2012-09-14 MED ORDER — CALCIUM CARBONATE ANTACID 500 MG PO CHEW
1.0000 | CHEWABLE_TABLET | Freq: Three times a day (TID) | ORAL | Status: DC
  Administered 2012-09-14 – 2012-09-16 (×3): 200 mg via ORAL
  Filled 2012-09-14 (×9): qty 1

## 2012-09-14 MED ORDER — NITROGLYCERIN IN D5W 200-5 MCG/ML-% IV SOLN
2.0000 ug/min | INTRAVENOUS | Status: DC
  Administered 2012-09-14 – 2012-09-16 (×3): 35 ug/min via INTRAVENOUS
  Filled 2012-09-14 (×2): qty 250

## 2012-09-14 MED ORDER — HYDRALAZINE HCL 50 MG PO TABS
50.0000 mg | ORAL_TABLET | Freq: Once | ORAL | Status: DC
  Filled 2012-09-14: qty 1

## 2012-09-14 MED ORDER — ACETAMINOPHEN 650 MG RE SUPP: 650.0000 mg | Freq: Four times a day (QID) | RECTAL | Status: DC | PRN

## 2012-09-14 MED ORDER — LOSARTAN POTASSIUM 50 MG PO TABS
100.0000 mg | ORAL_TABLET | Freq: Two times a day (BID) | ORAL | Status: DC
  Filled 2012-09-14 (×2): qty 2

## 2012-09-14 MED ORDER — CALCIUM ACETATE 667 MG PO CAPS
667.0000 mg | ORAL_CAPSULE | Freq: Three times a day (TID) | ORAL | Status: DC
  Filled 2012-09-14 (×4): qty 1

## 2012-09-14 MED ORDER — HYDRALAZINE HCL 50 MG PO TABS
50.0000 mg | ORAL_TABLET | Freq: Three times a day (TID) | ORAL | Status: DC
  Administered 2012-09-14 (×2): 50 mg via ORAL
  Filled 2012-09-14 (×7): qty 1

## 2012-09-14 MED ORDER — LIDOCAINE-PRILOCAINE 2.5-2.5 % EX CREA
1.0000 "application " | TOPICAL_CREAM | CUTANEOUS | Status: DC | PRN
  Filled 2012-09-14: qty 5

## 2012-09-14 MED ORDER — ALLOPURINOL 100 MG PO TABS
100.0000 mg | ORAL_TABLET | Freq: Every day | ORAL | Status: DC
  Administered 2012-09-14: 100 mg via ORAL
  Filled 2012-09-14 (×2): qty 1

## 2012-09-14 MED ORDER — DOCUSATE SODIUM 100 MG PO CAPS
200.0000 mg | ORAL_CAPSULE | Freq: Every day | ORAL | Status: DC
Start: 2012-09-14 — End: 2012-09-16
  Administered 2012-09-14 – 2012-09-15 (×2): 200 mg via ORAL
  Filled 2012-09-14 (×3): qty 2

## 2012-09-14 MED ORDER — SODIUM CHLORIDE 0.9 % IJ SOLN
3.0000 mL | Freq: Two times a day (BID) | INTRAMUSCULAR | Status: DC
  Administered 2012-09-15 (×2): 3 mL via INTRAVENOUS

## 2012-09-14 MED ORDER — LIDOCAINE HCL (PF) 1 % IJ SOLN: 5.0000 mL | INTRAMUSCULAR | Status: DC | PRN

## 2012-09-14 MED ORDER — CLONIDINE HCL 0.3 MG PO TABS: 0.3000 mg | ORAL_TABLET | Freq: Three times a day (TID) | ORAL | Status: DC

## 2012-09-14 MED ORDER — RENA-VITE PO TABS
1.0000 | ORAL_TABLET | Freq: Every day | ORAL | Status: DC
  Administered 2012-09-14 – 2012-09-15 (×2): 1 via ORAL
  Filled 2012-09-14 (×3): qty 1

## 2012-09-14 MED ORDER — HEPARIN SODIUM (PORCINE) 1000 UNIT/ML DIALYSIS: 1000.0000 [IU] | INTRAMUSCULAR | Status: DC | PRN

## 2012-09-14 MED ORDER — ACETAMINOPHEN 325 MG PO TABS: 650.0000 mg | ORAL_TABLET | Freq: Four times a day (QID) | ORAL | Status: DC | PRN

## 2012-09-14 MED ORDER — ONDANSETRON HCL 4 MG PO TABS: 4.0000 mg | ORAL_TABLET | Freq: Four times a day (QID) | ORAL | Status: DC | PRN

## 2012-09-14 MED ORDER — LABETALOL HCL 200 MG PO TABS
400.0000 mg | ORAL_TABLET | Freq: Two times a day (BID) | ORAL | Status: DC
  Administered 2012-09-14: 400 mg via ORAL
  Filled 2012-09-14 (×3): qty 2

## 2012-09-14 MED ORDER — BENZONATATE 100 MG PO CAPS
100.0000 mg | ORAL_CAPSULE | Freq: Three times a day (TID) | ORAL | Status: DC | PRN
  Filled 2012-09-14: qty 1

## 2012-09-14 MED ORDER — NITROGLYCERIN IN D5W 200-5 MCG/ML-% IV SOLN
2.0000 ug/min | Freq: Once | INTRAVENOUS | Status: AC
  Administered 2012-09-14: 5 ug/min via INTRAVENOUS
  Filled 2012-09-14: qty 250

## 2012-09-14 MED ORDER — SODIUM CHLORIDE 0.9 % IJ SOLN
3.0000 mL | Freq: Two times a day (BID) | INTRAMUSCULAR | Status: DC
  Administered 2012-09-14 – 2012-09-15 (×3): 3 mL via INTRAVENOUS

## 2012-09-14 MED ORDER — ALTEPLASE 2 MG IJ SOLR
2.0000 mg | Freq: Once | INTRAMUSCULAR | Status: DC | PRN
  Filled 2012-09-14: qty 2

## 2012-09-14 MED ORDER — L-METHYLFOLATE-B6-B12 3-35-2 MG PO TABS
1.0000 | ORAL_TABLET | Freq: Two times a day (BID) | ORAL | Status: DC
  Administered 2012-09-14: 1 via ORAL
  Filled 2012-09-14 (×4): qty 1

## 2012-09-14 MED ORDER — FUROSEMIDE 10 MG/ML IJ SOLN
80.0000 mg | Freq: Once | INTRAMUSCULAR | Status: AC
  Administered 2012-09-14: 80 mg via INTRAVENOUS
  Filled 2012-09-14 (×2): qty 4

## 2012-09-14 MED ORDER — INSULIN ASPART 100 UNIT/ML ~~LOC~~ SOLN
0.0000 [IU] | Freq: Three times a day (TID) | SUBCUTANEOUS | Status: DC
  Administered 2012-09-14: 3 [IU] via SUBCUTANEOUS
  Administered 2012-09-15 – 2012-09-16 (×2): 1 [IU] via SUBCUTANEOUS

## 2012-09-14 MED ORDER — NEPRO/CARBSTEADY PO LIQD
237.0000 mL | ORAL | Status: DC | PRN
  Filled 2012-09-14: qty 237

## 2012-09-14 MED ORDER — HYDROXYZINE HCL 25 MG PO TABS
ORAL_TABLET | ORAL | Status: AC
  Filled 2012-09-14: qty 2

## 2012-09-14 MED ORDER — REPAGLINIDE 0.5 MG PO TABS
0.5000 mg | ORAL_TABLET | Freq: Three times a day (TID) | ORAL | Status: DC
  Administered 2012-09-14: 0.5 mg via ORAL
  Filled 2012-09-14 (×6): qty 1

## 2012-09-14 MED ORDER — CLONIDINE HCL 0.3 MG PO TABS
0.3000 mg | ORAL_TABLET | Freq: Three times a day (TID) | ORAL | Status: DC
  Administered 2012-09-14: 0.3 mg via ORAL
  Filled 2012-09-14 (×4): qty 1

## 2012-09-14 NOTE — Progress Notes (Signed)
Pt removed herself from BiPAP

## 2012-09-14 NOTE — H&P (Signed)
[facility] History and Physical  Alexandra [name] ZOX:[id number] DOB: 1945/05/13 DOA: 09/14/2012  Referring physician: ER physician. PCP: Provider Not In System   Chief Complaint: Shortness of breath.  HPI: Alexandra [name] is a 67 y.o. female with known history of ESRD on hemodialysis, diabetes mellitus type 2, hypertension, gout, anemia was brought to the ER because of shortness of breath patient states that she became suddenly short of breath last night. Denies any chest pain or any fever chills productive cough. Chest x-ray revealed features consistent with pulmonary edema and patient's blood pressure was found to be very elevated. Patient has been placed on BiPAP and IV nitroglycerin was started and also was given IV Lasix and on-call nephrologist was consulted. Patient gets dialyzed on Tuesday Thursday and Saturday and states she did not Miss her dialysis. Patient otherwise denies any nausea vomiting abdominal pain diarrhea. [place]  Review of Systems: As presented in the history of presenting illness, rest negative.  Past Medical History  Diagnosis Date  . Chronic kidney disease     esrd  . Diabetes mellitus   . Hyperlipidemia   . Hypertension   . Renal disorder   . Dialysis care     tues, thurs, sat  . Gout due to renal impairment    Past Surgical History  Procedure Laterality Date  . Carotid endarterectomy  2002    left  . Btl    . Rectal fissurectomy    . Av fistula placement  10/30/2010    right brachiocephalic   . Fistulogram     Social History:  reports that she has quit smoking. Her smoking use included Cigarettes. She has a 50 pack-year smoking history. She has never used smokeless tobacco. She reports that she does not drink alcohol or use illicit drugs. Home. where does patient live-- Can do ADLs. Can patient participate in ADLs?  Allergies  Allergen Reactions  . Codeine Other (See Comments)    Passes  out  . Epinephrine Other (See Comments)    Tachycardia, diaphoresis, syncope  . Percocet (Oxycodone-Acetaminophen) Other (See Comments)    Passes  out  . Statins Other (See Comments)    Liver enzymes rise   PATIENT DOES NOT WANT TO BE GIVEN THIS TYPE OF MEDICATION PER THE AD[facility]ISE OF HER [facility]    Family History  Problem Relation Age of Onset  . COPD Mother   . Kidney disease Mother   . Heart disease Father   . Lymphoma Son       Prior to Admission medications   Medication Sig Start Date End Date Taking? Authorizing Provider  allopurinol (ZYLOPRIM) 100 MG tablet Take 100 mg by mouth daily.      Historical Provider, MD  amLODipine (NORVASC) 5 MG tablet Take 10 mg by mouth daily. 07/13/11   [name], MD  benzonatate (TESSALON) 100 MG capsule Take 100 mg by mouth 3 (three) times daily as needed. cough    Historical Provider, MD  calcium acetate (PHOSLO) 667 MG capsule Take 667 mg by mouth 4 (four) times daily.     Historical Provider, MD  cloNIDine (CATAPRES - DOSED IN MG/24 HR) 0.3 mg/24hr Place 1 patch onto the skin once a week.    Historical Provider, MD  Coenzyme Q10 150 MG CAPS Take 300 mg by mouth daily. Hold while in hospital    Historical Provider, MD  docusate sodium (COLACE) 100 MG capsule Take 200 mg  by mouth at bedtime.      Historical Provider, MD  hydrALAZINE (APRESOLINE) 25 MG tablet Take 50 mg by mouth 3 (three) times daily.  07/18/11   Antonieta Pert, MD  l-methylfolate-B6-B12 (METANX) 3-35-2 MG TABS Take 1 tablet by mouth 2 (two) times daily.     Historical Provider, MD  losartan (COZAAR) 100 MG tablet Take 100 mg by mouth 2 (two) times daily.    Historical Provider, MD  metoprolol succinate (TOPROL-XL) 100 MG 24 hr tablet Take 50 mg by mouth 2 (two) times daily. On Tuesday, Thursday and Saturday, she waits until after dialysis to take this medication    Historical Provider, MD  multivitamin (RENA-VIT) TABS tablet Take 1 tablet by mouth daily.       Historical Provider, MD  promethazine (PHENERGAN) 6.25 MG/5ML syrup Take 3.125-6.25 mg by mouth 4 (four) times daily as needed. cough    Historical Provider, MD  repaglinide (PRANDIN) 0.5 MG tablet Take 0.5 mg by mouth 3 (three) times daily before meals.     Historical Provider, MD   Physical Exam: Filed Vitals:   09/14/12 0420 09/14/12 0500 09/14/12 0515 09/14/12 0530  BP: 226/54 202/52 188/57 175/54  Pulse:  78 75 74  Resp: 26 20 21 18   SpO2: 100% 100% 100% 100%     General:  Well-developed and nourished.  Eyes: Anicteric no pallor.  ENT: No discharge from the ears eyes nose mouth.  Neck: No mass felt.  Cardiovascular: S1-S2 heard.  Respiratory: No rhonchi or crepitations.  Abdomen: Soft nontender bowel sounds present.  Skin: No rash.  Musculoskeletal: Bilateral lower extremity edema.  Psychiatric: Appears normal.  Neurologic: Alert awake oriented to time place and person. Moves all extremities.  Labs on Admission:  Basic Metabolic Panel:  Recent Labs Lab 09/14/12 0456  NA 140  K 4.3  CL 101  CO2 22  GLUCOSE 172*  BUN 35*  CREATININE 6.63*  CALCIUM 9.2   Liver Function Tests: No results found for this basename: AST, ALT, ALKPHOS, BILITOT, PROT, ALBUMIN,  in the last 168 hours No results found for this basename: LIPASE, AMYLASE,  in the last 168 hours No results found for this basename: AMMONIA,  in the last 168 hours CBC:  Recent Labs Lab 09/14/12 0456  WBC 10.2  HGB 9.7*  HCT 30.1*  MCV 91.8  PLT 171   Cardiac Enzymes: No results found for this basename: CKTOTAL, CKMB, CKMBINDEX, TROPONINI,  in the last 168 hours  BNP (last 3 results)  Recent Labs  10/15/11 0707  PROBNP 31669.0*   CBG: No results found for this basename: GLUCAP,  in the last 168 hours  Radiological Exams on Admission: Dg Chest Port 1 View  09/14/2012   *RADIOLOGY REPORT*  Clinical Data: Short of breath.  PORTABLE CHEST - 1 VIEW  Comparison: 11/21/2011.  Findings: The  right IJ dialysis catheter has been removed.  Low lung volumes are present.  Pulmonary vascular congestion is present with interstitial pulmonary edema at the bases.  Bilateral basilar atelectasis. Monitoring leads are projected over the chest.  No focal consolidation. Size of the cardiopericardial silhouette is within normal limits.  IMPRESSION: Low volume chest with basilar atelectasis and interstitial pulmonary edema suggesting volume overload.   Original Report Authenticated By: Andreas Newport, M.D.    EKG: Independently reviewed. Normal sinus rhythm with nonspecific ST-T changes.  Assessment/Plan Principal Problem:   Acute pulmonary edema Active Problems:   ESRD (end stage renal disease) on dialysis  Diabetes mellitus   Hypertension, accelerated   1. Acute pulmonary edema secondary to fluid overload in an ESRD patient on hemodialysis - continue with BiPAP and nitroglycerin infusion until patient gets dialyzed. On-call nephrologist has been notified by ER physician and dialysis is being arranged. Think patient's condition will improve with dialysis. 2. Accelerated hypertension - probably will improve with dialysis. Continue home medications. Continue nitroglycerin infusion until dialysis. 3. Diabetes mellitus type 2 - continue home medications with sliding-scale coverage. 4. Anemia probably secondary to ESRD - closely follow CBC. 5. History of gout - continue present medications.    Code Status: Full code.  Family Communication: None.  Disposition Plan: Admit to inpatient.    Arn Mcomber N. [facility] Pager [phone].  If 7PM-7AM, please contact night-coverage [email or web address] Password [email or web address] 09/14/2012, 6:30 AM

## 2012-09-14 NOTE — Progress Notes (Signed)
Utilization review completed.  P.J. Annette Liotta,RN,BSN Case Manager [phone]  

## 2012-09-14 NOTE — Procedures (Signed)
Patient seen on Hemodialysis. QB 400, UF goal 4.5L Treatment adjusted as needed.  Zetta Bills MD [facility]. Office # [phone] Pager # [phone] 1:34 PM

## 2012-09-14 NOTE — Progress Notes (Signed)
Pt admitted early this AM with pulmonary edema- admitted for need to undergo dialysis - receiving it now.  Pt examined this morning and all questions answered.  Will re-evaluate tomorrow AM   Calvert Cantor, MD [phone]

## 2012-09-14 NOTE — ED Notes (Signed)
Pt. Requesting water; Will have to ask MD b/c pt. Is to be on fluid restrictions.

## 2012-09-14 NOTE — Progress Notes (Addendum)
Pt transported to Hemodialysis on vent

## 2012-09-14 NOTE — Progress Notes (Signed)
Rt went to check on pt with Bipap and pt was off bipap. Per RN she kept taking it off. Initailly she was placed on [place] , now on RA.

## 2012-09-14 NOTE — Progress Notes (Signed)
Pt placed on Bipap due to> WOB.

## 2012-09-14 NOTE — Consult Note (Signed)
Alexandra [name] [facility] 09/14/2012 Lendy Dittrich D Requesting Physician:  Dr Toniann Fail  Reason for Consult:  ESRD pt with acute resp failure HPI: The patient is a 67 y.o. year-old AAF with hx of severe HTN, DM and ESRD on HD in [place] w Dr Keturah Shavers group.  Presented to ED today with resp distress. CXR showed pulm edema. On bipap. No chest pain  ROS  no cp  no abd pain  no n/v/d  says she has not missed Hd  +ankle edema    Past Medical History  Past Medical History  Diagnosis Date  . Diabetes mellitus   . Hyperlipidemia   . Hypertension   . Gout due to renal impairment   . Stroke 1999    TIA  PER PATIENT  . ESRD on dialysis 06/12/2011    Gets dialysis [place], [place], TTS schedule with Dr Louis Meckel, started dialysis in 2010.       Past Surgical History  Past Surgical History  Procedure Laterality Date  . Carotid endarterectomy  2002    left  . Btl    . Rectal fissurectomy    . Av fistula placement  10/30/2010    right brachiocephalic   . Fistulogram      Family History  Family History  Problem Relation Age of Onset  . COPD Mother   . Kidney disease Mother   . Heart disease Father   . Lymphoma Son    Social History  reports that she has been smoking Cigarettes.  She has a 50 pack-year smoking history. She has never used smokeless tobacco. She reports that she does not drink alcohol or use illicit drugs.  Allergies  Allergies  Allergen Reactions  . Codeine Other (See Comments)    Passes out  . Epinephrine Other (See Comments)    Tachycardia, diaphoresis, syncope  . Percocet (Oxycodone-Acetaminophen) Other (See Comments)    Passes  out  . Statins Other (See Comments)    Liver enzymes rise   PATIENT DOES NOT WANT TO BE GIVEN THIS TYPE OF MEDICATION PER THE ADVISE OF HER [facility]    Home medications Prior to Admission medications   Medication Sig Start Date End Date Taking? Authorizing Provider  allopurinol (ZYLOPRIM) 100 MG tablet Take  100 mg by mouth daily.      Historical Provider, MD  amLODipine (NORVASC) 5 MG tablet Take 10 mg by mouth daily. 07/13/11   [name], MD  benzonatate (TESSALON) 100 MG capsule Take 100 mg by mouth 3 (three) times daily as needed. cough    Historical Provider, MD  calcium acetate (PHOSLO) 667 MG capsule Take 667 mg by mouth 4 (four) times daily.     Historical Provider, MD  cloNIDine (CATAPRES - DOSED IN MG/24 HR) 0.3 mg/24hr Place 1 patch onto the skin once a week.    Historical Provider, MD  Coenzyme Q10 150 MG CAPS Take 300 mg by mouth daily. Hold while in hospital    Historical Provider, MD  docusate sodium (COLACE) 100 MG capsule Take 200 mg by mouth at bedtime.      Historical Provider, MD  hydrALAZINE (APRESOLINE) 25 MG tablet Take 50 mg by mouth 3 (three) times daily.  07/18/11   Antonieta Pert, MD  l-methylfolate-B6-B12 (METANX) 3-35-2 MG TABS Take 1 tablet by mouth 2 (two) times daily.     Historical Provider, MD  losartan (COZAAR) 100 MG tablet Take 100 mg by mouth 2 (two) times daily.  Historical Provider, MD  metoprolol succinate (TOPROL-XL) 100 MG 24 hr tablet Take 50 mg by mouth 2 (two) times daily. On Tuesday, Thursday and Saturday, she waits until after dialysis to take this medication    Historical Provider, MD  multivitamin (RENA-VIT) TABS tablet Take 1 tablet by mouth daily.      Historical Provider, MD  promethazine (PHENERGAN) 6.25 MG/5ML syrup Take 3.125-6.25 mg by mouth 4 (four) times daily as needed. cough    Historical Provider, MD  repaglinide (PRANDIN) 0.5 MG tablet Take 0.5 mg by mouth 3 (three) times daily before meals.     Historical Provider, MD    LABS Liver Function Tests No results found for this basename: AST, ALT, ALKPHOS, BILITOT, PROT, ALBUMIN,  in the last 168 hours No results found for this basename: LIPASE, AMYLASE,  in the last 168 hours CBC  Recent Labs Lab 09/14/12 0456 09/14/12 0912  WBC 10.2 7.7  HGB 9.7* 9.2*  HCT 30.1* 28.1*  MCV 91.8  92.1  PLT 171 165   Basic Metabolic Panel  Recent Labs Lab 09/14/12 0456  NA 140  K 4.3  CL 101  CO2 22  GLUCOSE 172*  BUN 35*  CREATININE 6.63*  CALCIUM 9.2    Physical Exam:  Blood pressure 195/64, pulse 88, temperature 97.6 F (36.4 C), temperature source Oral, resp. rate 37, height 5\' 8"  (1.727 m), weight 56 kg (123 lb 7.3 oz), last menstrual period 09/27/2011, SpO2 100.00%. Gen: very sob, they are putting bipap on  HEENT:  EOMI, sclera anicteric, throat clear Neck: marked JVD, no LAN Chest: bilat rales 1/2 post CV: regular, no rub or gallop, no murmur, pedal pulses intact Abdomen: soft, nontender, +BS no HSM Ext: 2+ bilat pretibial edema , no joint effusion or deformity, no gangrene or ulceration Neuro: alert, Ox3, no focal deficit Access: RUA AVF patent  Outpatient HD ([place] [facility]  TTS) 3.5 hrs  122lb EDW   2.5Ca  Prof 2   RUA AVF   No heparin EPO 2500    Hect 2ug biw   Ferrlicit 62.5mg  weekly   Impression/Plan 1 SOB, due to pulm edema from vol overload > HD asap upstairs 2 ESRD, usual hd TTS 3 HTN- on 5 BP meds at home 4 Anemia of CKD- get EPO dosing 5 Secondary HPTH- tums 1 ac is binder 6 Hx DM 7 Hx TIA/CVA   Vinson Moselle  MD Pager [phone]    Cell  [phone] 09/14/2012, 10:31 AM

## 2012-09-14 NOTE — Consult Note (Signed)
[facility] - CONSULT NOTE Resident Note    Please see below for attending addendum to resident note.   Date: 09/14/2012                  Patient Name:  Alexandra [name]  MRN: [id number]  DOB: 1945-11-17  Age / Sex: 67 y.o., female         PCP: Provider Not In System                 Referring Physician: Dr Eduard Clos                  Reason for Consult: For Hemodialysis - SOB and Pulmonary edema            History of Present Illness: Mr Alexandra [name] is a 67 y.o. female with a PMHx of history of ESRD (HD at [facility] on TTS), DM 2, difficult to control HTN, gout, anemia was brought to the ER because of shortness of breath which sudden started at 11p.m last night. She reports that she had spent a good day at [facility] where she was being evaluated for HTN by Dr Chales Abrahams.  She was admitted to [facility] on 09/14/2012 and will need HD for pulmonary edema as revealed by CXR. She has had very high BP. She reports that her last HD was on Tuesday and she insists she has not missed any HD recently.   She denies any chest pain.. She denies cough, fever or chills. She denies any nausea vomiting abdominal pain diarrhea.  She was last admitted at [facility] for pulmonary edema was 11/2011.   She has required BiBAP throughout the night.  Medications: Outpatient medications: Prescriptions prior to admission  Medication Sig Dispense Refill  . allopurinol (ZYLOPRIM) 100 MG tablet Take 100 mg by mouth daily.        Marland Kitchen amLODipine (NORVASC) 5 MG tablet Take 10 mg by mouth daily.      . benzonatate (TESSALON) 100 MG capsule Take 100 mg by mouth 3 (three) times daily as needed. cough      . calcium acetate (PHOSLO) 667 MG capsule Take 667 mg by mouth 4 (four) times daily.       . calcium carbonate (TUMS - DOSED IN MG ELEMENTAL CALCIUM) 500 MG chewable tablet Chew 1 tablet by mouth 3 (three) times daily before meals.      . Coenzyme Q10 150 MG CAPS Take 300 mg by mouth daily. Hold while in  hospital      . docusate sodium (COLACE) 100 MG capsule Take 200 mg by mouth at bedtime.        . hydrALAZINE (APRESOLINE) 25 MG tablet Take 50 mg by mouth 3 (three) times daily.       Marland Kitchen l-methylfolate-B6-B12 (METANX) 3-35-2 MG TABS Take 1 tablet by mouth 2 (two) times daily.       Marland Kitchen losartan (COZAAR) 100 MG tablet Take 100 mg by mouth 2 (two) times daily.      . metoprolol succinate (TOPROL-XL) 100 MG 24 hr tablet Take 50 mg by mouth 2 (two) times daily. On Tuesday, Thursday and Saturday, she waits until after dialysis to take this medication      . multivitamin (RENA-VIT) TABS tablet Take 1 tablet by mouth daily.        . promethazine (PHENERGAN) 6.25 MG/5ML syrup Take 3.125-6.25 mg by mouth 4 (four) times daily as needed. cough      .  repaglinide (PRANDIN) 0.5 MG tablet Take 0.5 mg by mouth 3 (three) times daily before meals.         Current medications: Current Facility-Administered Medications  Medication Dose Route Frequency Provider Last Rate Last Dose  . acetaminophen (TYLENOL) tablet 650 mg  650 mg Oral Q6H PRN Eduard Clos, MD       Or  . acetaminophen (TYLENOL) suppository 650 mg  650 mg Rectal Q6H PRN Eduard Clos, MD      . allopurinol (ZYLOPRIM) tablet 100 mg  100 mg Oral Daily Eduard Clos, MD      . amLODipine (NORVASC) tablet 10 mg  10 mg Oral Daily Eduard Clos, MD      . benzonatate (TESSALON) capsule 100 mg  100 mg Oral TID PRN Eduard Clos, MD      . calcium carbonate (TUMS - dosed in mg elemental calcium) chewable tablet 200 mg of elemental calcium  1 tablet Oral TID WC Maree Krabbe, MD      . cloNIDine (CATAPRES - Dosed in mg/24 hr) patch 0.3 mg  0.3 mg Transdermal Weekly Eduard Clos, MD      . docusate sodium (COLACE) capsule 200 mg  200 mg Oral QHS Eduard Clos, MD      . enoxaparin (LOVENOX) injection 30 mg  30 mg Subcutaneous Q24H Eduard Clos, MD      . hydrALAZINE (APRESOLINE) tablet 50 mg  50 mg Oral  TID Eduard Clos, MD      . insulin aspart (novoLOG) injection 0-9 Units  0-9 Units Subcutaneous TID WC Eduard Clos, MD      . l-methylfolate-B6-B12 (METANX) 3-35-2 MG per tablet 1 tablet  1 tablet Oral BID Eduard Clos, MD      . losartan (COZAAR) tablet 100 mg  100 mg Oral BID Eduard Clos, MD      . metoprolol succinate (TOPROL-XL) 24 hr tablet 50 mg  50 mg Oral Custom Eduard Clos, MD      . multivitamin (RENA-VIT) tablet 1 tablet  1 tablet Oral QHS Eduard Clos, MD      . nitroGLYCERIN 0.2 mg/mL in dextrose 5 % infusion  2-200 mcg/min Intravenous Titrated Eduard Clos, MD 10.5 mL/hr at 09/14/12 1055 35 mcg/min at 09/14/12 1055  . ondansetron (ZOFRAN) tablet 4 mg  4 mg Oral Q6H PRN Eduard Clos, MD       Or  . ondansetron [facility]) injection 4 mg  4 mg Intravenous Q6H PRN Eduard Clos, MD      . repaglinide (PRANDIN) tablet 0.5 mg  0.5 mg Oral TID AC Eduard Clos, MD      . sodium chloride 0.9 % injection 3 mL  3 mL Intravenous Q12H Eduard Clos, MD      . sodium chloride 0.9 % injection 3 mL  3 mL Intravenous Q12H Eduard Clos, MD          Allergies: Allergies  Allergen Reactions  . Codeine Other (See Comments)    Passes out  . Epinephrine Other (See Comments)    Tachycardia, diaphoresis, syncope  . Percocet (Oxycodone-Acetaminophen) Other (See Comments)    Passes  out  . Statins Other (See Comments)    Liver enzymes rise   PATIENT DOES NOT WANT TO BE GIVEN THIS TYPE OF MEDICATION PER THE ADVISE OF [facility]      Past Medical History: Past Medical  History  Diagnosis Date  . Diabetes mellitus   . Hyperlipidemia   . Hypertension   . Gout due to renal impairment   . Stroke 1999    TIA  PER PATIENT  . ESRD on dialysis 06/12/2011    Gets dialysis [place], [place], TTS schedule with Dr Louis Meckel, started dialysis in 2010.        Past Surgical History: Past Surgical History   Procedure Laterality Date  . Carotid endarterectomy  2002    left  . Btl    . Rectal fissurectomy    . Av fistula placement  10/30/2010    right brachiocephalic   . Fistulogram       Family History: Family History  Problem Relation Age of Onset  . COPD Mother   . Kidney disease Mother   . Heart disease Father   . Lymphoma Son      Social History:  reports that she has been smoking Cigarettes.  She has a 50 pack-year smoking history. She has never used smokeless tobacco. She reports that she does not drink alcohol or use illicit drugs.   Review of Systems: As per HPI  Vital Signs: Blood pressure 164/45, pulse 64, temperature 97.6 F (36.4 C), temperature source Oral, resp. rate 16, height 5\' 8"  (1.727 m), weight 123 lb 7.3 oz (56 kg), last menstrual period 09/27/2011, SpO2 100.00%.  Weight trends: Filed Weights   09/14/12 0850  Weight: 123 lb 7.3 oz (56 kg)   Filed Vitals:   09/14/12 1133  BP:   Pulse: 82  Temp:   Resp: 20   Physical Exam: General: On BiPAP, speaks on short sentences.   Head: Normocephalic, atraumatic.  Eyes: PERRL, EOMI, No signs of anemia or jaundince.  Nose: Mucous membranes moist, not inflammed, nonerythematous.  Throat: Oropharynx nonerythematous, no exudate appreciated.   Neck: No deformities, masses, or tenderness noted.Supple, No carotid Bruits, no JVD.  Lungs:  Increased work of breathing, bibasilar rales. No wheezes heard.  Heart: Tachycardia, S1and S2 normal without gallop, murmur, or rubs.  Abdomen:  BS normoactive. Soft, Nondistended, non-tender.  No masses or organomegaly.  Extremities: Bilateral pitting edema  Neurologic: A&O X3,  Skin: No visible rashes, scars.    Lab results: Basic Metabolic Panel:  Recent Labs Lab 09/14/12 0456 09/14/12 0912  NA 140  --   K 4.3  --   CL 101  --   CO2 22  --   GLUCOSE 172*  --   BUN 35*  --   CREATININE 6.63* 7.05*  CALCIUM 9.2  --    CBC:  Recent Labs Lab 09/14/12 0456  09/14/12 0912  WBC 10.2 7.7  HGB 9.7* 9.2*  HCT 30.1* 28.1*  MCV 91.8 92.1  PLT 171 165    Microbiology: Results for orders placed during the hospital encounter of 10/11/11  MRSA PCR SCREENING     Status: None   Collection Time    10/12/11  1:00 AM      Result Value Range Status   MRSA by PCR NEGATIVE  NEGATIVE Final   Comment:            The GeneXpert MRSA Assay (FDA     approved for NASAL specimens     only), is one component of a     comprehensive MRSA colonization     surveillance program. It is not     intended to diagnose MRSA     infection nor to guide or  monitor treatment for     MRSA infections.  CULTURE, RESPIRATORY     Status: None   Collection Time    10/12/11  4:36 AM      Result Value Range Status   Specimen Description TRACHEAL ASPIRATE   Final   Special Requests NONE   Final   Gram Stain     Final   Value: NO WBC SEEN     NO SQUAMOUS EPITHELIAL CELLS SEEN     NO ORGANISMS SEEN   Culture Non-Pathogenic Oropharyngeal-type Flora Isolated.   Final   Report Status 10/14/2011 FINAL   Final  CULTURE, BLOOD (SINGLE)     Status: None   Collection Time    10/13/11  2:15 PM      Result Value Range Status   Specimen Description BLOOD HEMODIALYSIS CATHETER   Final   Special Requests BOTTLES DRAWN AEROBIC AND ANAEROBIC 10CC   Final   Culture  Setup Time 10/13/2011 20:18   Final   Culture NO GROWTH 5 DAYS   Final   Report Status 10/19/2011 FINAL   Final     Imaging: Dg Chest Port 1 View  09/14/2012   *RADIOLOGY REPORT*  Clinical Data: Short of breath.  PORTABLE CHEST - 1 VIEW  Comparison: 11/21/2011.  Findings: The right IJ dialysis catheter has been removed.  Low lung volumes are present.  Pulmonary vascular congestion is present with interstitial pulmonary edema at the bases.  Bilateral basilar atelectasis. Monitoring leads are projected over the chest.  No focal consolidation. Size of the cardiopericardial silhouette is within normal limits.  IMPRESSION: Low  volume chest with basilar atelectasis and interstitial pulmonary edema suggesting volume overload.   Original Report Authenticated By: Andreas Newport, M.D.     Assessment & Plan:  67 y.o. female with a PMHx of history of ESRD (HD at [facility] on TTS), DM 2, difficult to control HTN, gout, anemia admitted with SOB and CXR revealing pulmonary edema. She also has severely elevated BP.   #ESRD on HD: Secondary to diabetes, and hypertension. Has been on dialysis since since 2012. Reports compliance with her dialysis on Tuesdays, Thursdays, and Saturdays at [facility]. This is the most likely cause of this patient's severe respiratory distress and a chest x-ray revealing pulmonary edema. Electrolytes including potassium, CO2 and BUN are stable. [place] dialysis center (# [phone]) confirmed that she had HD Tuesday. Plan. - scheduled HD today - renal diet/carb modified  - fluid restriction  - daily weights   # Acute Pulmonary Edema: This is a recurrent problem. Her last admission at [facility] was in 11/2011. Likely related to end-stage renal disease, and severe hypertension. Physical exam reveals fluid overload, and with rales, and bilateral, lower extremity edema. ProBNP was elevated at 31,000. Last echocardiogram in July 2013 and showed an EF of 60% with diastolic dysfunction, grade 1. Chest x-ray reveals pulmonary edema bilaterally. Troponin is negative. EKG does not reveal acute ischemic changes. She received IV Furosemide 80 mg in ED. Plan. -And dialysis as above. -Blood pressure control as below   # Severe Hypertension: Her home medications include Amlodipine 10 mg daily, Clonidine punch 0.3 mg every 24 hours, Hydralazine, 50 mg 3 times a day, Losartan 100 mg twice a day, and Metoprolol 50 mg twice a day. She report compliance with her medication. She also reports that she is undergoing some kind of genetic evaluation at [facility] by Dr Chales Abrahams for this. On admission blood pressure was 226/54. She  has a wide pulse pressure. No aortic stenosis on the echo. Plan  - Dialysis likely to help - on NTG gtt - Amlodipine 10 mg daily. - Clonidine patch 0.3 mg every night. - Metoprolol, 50 mg twice daily. - Losartan 100 mg twice daily.  # DM 2: A1C 5.4 in 07/2011. She takes Repaglinide 0.5 mg tid. CBGs in 170-190.  Plan  - continue with Repaglinidine as per primary team  - SSI  # HD Access: Right upper extremity AV fistula placed in 2012. Appears functional.  # Enemia: Hemoglobin 9.7 which is about baseline. Likely related to her chronic kidney disease.  It is unclear whether she is on erythropoietin  # Bone: Calcium 9.2. Phosphorous 2.5. Last PTH was elevated in 2011 at 422.5.  # Gout: On Allopurinol    DVT PPX - Levonox as per primary team  Patient history and plan of care reviewed with attending, Dr. Darrick Penna.  Signed:  Dow Adolph, MD PGY-2 Internal Medicine Teaching Service Pager: [phone] 09/14/2012, 12:41 PM   Patient seen and examined.  Agree with assessment and plan as above. Vinson Moselle  MD Pager [phone]    Cell  [phone] 09/14/2012, 1:49 PM

## 2012-09-14 NOTE — ED Notes (Signed)
Presents with respiratory distress from home, dialysis patient, dialysis days T, TH, S. Bilateral rhonchi and wheezes, given 125 mg Solumedrol, 10 albuterol and 0.5 atrovent. Brought in by EMS on CPAP, initially in tripod position and pulse ox 72% on RA.

## 2012-09-14 NOTE — ED Provider Notes (Addendum)
History    CSN: [id number] Arrival date & time 09/14/12  0257  First MD Initiated Contact with Patient 09/14/12 0310     Chief Complaint  Patient presents with  . Shortness of Breath   (Consider location/radiation/quality/duration/timing/severity/associated sxs/prior Treatment) Patient is a 67 y.o. female presenting with shortness of breath. The history is provided by the patient.  Shortness of Breath Associated symptoms: rash   Associated symptoms: no abdominal pain, no chest pain, no fever and no headaches    patient with acute sudden onset of shortness of breath first first rest at home. EMS called. Patient's dialysis patient normally dialyzed Tuesday Thursdays and Saturdays. She was dialyzed on Tuesday. EMS stated she had bilateral rhonchi and wheezing they treated her with side Medrol and albuterol and Atrovent and had to switch her over to CPAP on the way in. Patient preferred to go to [facility] but due to the respiratory distress they brought her here. She was in a tripod position. Her sats on room air get down to 72%.    Past Medical History  Diagnosis Date  . Chronic kidney disease     esrd  . Diabetes mellitus   . Hyperlipidemia   . Hypertension   . Renal disorder   . Dialysis care     tues, thurs, sat  . Gout due to renal impairment    Past Surgical History  Procedure Laterality Date  . Carotid endarterectomy  2002    left  . Btl    . Rectal fissurectomy    . Av fistula placement  10/30/2010    right brachiocephalic   . Fistulogram     Family History  Problem Relation Age of Onset  . COPD Mother   . Kidney disease Mother   . Heart disease Father   . Lymphoma Son    History  Substance Use Topics  . Smoking status: Former Smoker -- 1.00 packs/day for 50 years    Types: Cigarettes  . Smokeless tobacco: Never Used  . Alcohol Use: No   OB History   Grav Para Term Preterm Abortions TAB SAB Ect Mult Living                 Review of Systems   Constitutional: Negative for fever.  HENT: Negative for congestion.   Eyes: Negative for redness.  Respiratory: Positive for shortness of breath.   Cardiovascular: Positive for leg swelling. Negative for chest pain.  Gastrointestinal: Negative for abdominal pain.  Skin: Positive for rash.  Neurological: Negative for headaches.  Hematological: Bruises/bleeds easily.  Psychiatric/Behavioral: Negative for confusion.    Allergies  Codeine; Epinephrine; Percocet; and Statins  Home Medications   Current Outpatient Rx  Name  Route  Sig  Dispense  Refill  . allopurinol (ZYLOPRIM) 100 MG tablet   Oral   Take 100 mg by mouth daily.           Marland Kitchen amLODipine (NORVASC) 5 MG tablet   Oral   Take 10 mg by mouth daily.         . benzonatate (TESSALON) 100 MG capsule   Oral   Take 100 mg by mouth 3 (three) times daily as needed. cough         . calcium acetate (PHOSLO) 667 MG capsule   Oral   Take 667 mg by mouth 4 (four) times daily.          . cloNIDine (CATAPRES - DOSED IN MG/24 HR) 0.3 mg/24hr  Transdermal   Place 1 patch onto the skin once a week.         . Coenzyme Q10 150 MG CAPS   Oral   Take 300 mg by mouth daily. Hold while in hospital         . docusate sodium (COLACE) 100 MG capsule   Oral   Take 200 mg by mouth at bedtime.           . hydrALAZINE (APRESOLINE) 25 MG tablet   Oral   Take 50 mg by mouth 3 (three) times daily.          Marland Kitchen l-methylfolate-B6-B12 (METANX) 3-35-2 MG TABS   Oral   Take 1 tablet by mouth 2 (two) times daily.          Marland Kitchen losartan (COZAAR) 100 MG tablet   Oral   Take 100 mg by mouth 2 (two) times daily.         . metoprolol succinate (TOPROL-XL) 100 MG 24 hr tablet   Oral   Take 50 mg by mouth 2 (two) times daily. On Tuesday, Thursday and Saturday, she waits until after dialysis to take this medication         . multivitamin (RENA-VIT) TABS tablet   Oral   Take 1 tablet by mouth daily.           . promethazine  (PHENERGAN) 6.25 MG/5ML syrup   Oral   Take 3.125-6.25 mg by mouth 4 (four) times daily as needed. cough         . repaglinide (PRANDIN) 0.5 MG tablet   Oral   Take 0.5 mg by mouth 3 (three) times daily before meals.           BP 202/52  Pulse 78  Resp 20  SpO2 100%  LMP 09/27/2011 Physical Exam  Nursing note and vitals reviewed. Constitutional: She is oriented to person, place, and time. She appears well-developed and well-nourished. She appears distressed.  HENT:  Head: Normocephalic and atraumatic.  Mouth/Throat: Oropharynx is clear and moist.  Eyes: Conjunctivae and EOM are normal. Pupils are equal, round, and reactive to light.  Neck: Normal range of motion.  Cardiovascular: Normal rate, regular rhythm and normal heart sounds.   Pulmonary/Chest: She is in respiratory distress. She has no wheezes. She has rales. She exhibits no tenderness.  Abdominal: Soft. Bowel sounds are normal. There is no tenderness.  Musculoskeletal: Normal range of motion. She exhibits edema.  AV fistula right upper arm. Good pulse and good thrill.  Neurological: She is alert and oriented to person, place, and time. No cranial nerve deficit. She exhibits normal muscle tone. Coordination normal.  Skin: Skin is warm. No rash noted. No erythema.    ED Course  Procedures (including critical care time) Labs Reviewed  CBC  BASIC METABOLIC PANEL  POCT I-STAT TROPONIN I   Dg Chest Port 1 View  09/14/2012   *RADIOLOGY REPORT*  Clinical Data: Short of breath.  PORTABLE CHEST - 1 VIEW  Comparison: 11/21/2011.  Findings: The right IJ dialysis catheter has been removed.  Low lung volumes are present.  Pulmonary vascular congestion is present with interstitial pulmonary edema at the bases.  Bilateral basilar atelectasis. Monitoring leads are projected over the chest.  No focal consolidation. Size of the cardiopericardial silhouette is within normal limits.  IMPRESSION: Low volume chest with basilar atelectasis  and interstitial pulmonary edema suggesting volume overload.   Original Report Authenticated By: Andreas Newport, M.D.    CRITICAL  CARE Performed by: Shelda Jakes. Total critical care time: 30 Critical care time was exclusive of separately billable procedures and treating other patients. Critical care was necessary to treat or prevent imminent or life-threatening deterioration. Critical care was time spent personally by me on the following activities: development of treatment plan with patient and/or surrogate as well as nursing, discussions with consultants, evaluation of patient's response to treatment, examination of patient, obtaining history from patient or surrogate, ordering and performing treatments and interventions, ordering and review of laboratory studies, ordering and review of radiographic studies, pulse oximetry and re-evaluation of patient's condition.    1. Pulmonary edema   2. Chronic diastolic CHF (congestive heart failure)   3. Hypertension     MDM  Patient symptoms are consistent with flash pulmonary edema. Patient also hypertensive. Patient being treated the with IV nitroglycerin being titrated to bring her blood pressure down. Patient also urinates twice a day so 80 mg of Lasix provided. Patient brought in by EMS she was in respiratory distress fair with sats of 72% on room air. Started on CPAP and transition to BiPAP here. EMS treated her as if this was an exacerbation of COPD the patient does not have a history of that. Patient's dialysis is now in [facility] area. Her primary care doctor is Dr. Tenny Craw from [place]. Patient denied any chest pain. Patient does have marked swelling of the legs. We'll discuss with the hospitalist for a Mission. Patient's sats are working we prove BiPAP. Do not think that she needs to be a pulmonary critical care admission but we'll discuss with the hospitalist.    Nephrology notified.  Shelda Jakes, MD 09/14/12 4098  Shelda Jakes, MD 09/14/12 [phone]

## 2012-09-14 NOTE — Progress Notes (Signed)
Pt transported from ED to 2600. Pt taking BiPap off, talking, awake, alert and complaining about mask. Placed on 3 L [place], sat 98%, RR 14.

## 2012-09-14 NOTE — Procedures (Deleted)
I was present at this dialysis session. I have reviewed the session itself and made appropriate changes.   Vinson Moselle  MD Pager [phone]    Cell  [phone] 09/14/2012, 2:01 PM

## 2012-09-14 NOTE — Progress Notes (Signed)
Pt arrived to 2603 from the ED via stretcher accompanied by 2 RNs and Resp. Tx. Pt on monitor and nasal cannula at 3L. Nitro drip. Pt alert, oriented, denies pain. Pt admitted to unit per stepdown protocol. Vitals stable with elevated systolic at 190. Bipap unit at bedside but not in use. Bed low, call light within reach. Orders checked. Will continue to monitor.   - Alwyn Ren, RN

## 2012-09-14 NOTE — ED Notes (Signed)
Attempted to call report to 2600 

## 2012-09-15 DIAGNOSIS — J9600 Acute respiratory failure, unspecified whether with hypoxia or hypercapnia: Secondary | ICD-10-CM

## 2012-09-15 DIAGNOSIS — I509 Heart failure, unspecified: Secondary | ICD-10-CM

## 2012-09-15 DIAGNOSIS — I5032 Chronic diastolic (congestive) heart failure: Secondary | ICD-10-CM

## 2012-09-15 LAB — GLUCOSE, CAPILLARY: Glucose-Capillary: 181 mg/dL — ABNORMAL HIGH (ref 70–99)

## 2012-09-15 LAB — COMPREHENSIVE METABOLIC PANEL
Albumin: 2.5 g/dL — ABNORMAL LOW (ref 3.5–5.2)
Alkaline Phosphatase: 120 U/L — ABNORMAL HIGH (ref 39–117)
BUN: 22 mg/dL (ref 6–23)
Calcium: 8.1 mg/dL — ABNORMAL LOW (ref 8.4–10.5)
Creatinine, Ser: 4.15 mg/dL — ABNORMAL HIGH (ref 0.50–1.10)
Potassium: 3.5 mEq/L (ref 3.5–5.1)
Total Protein: 5.6 g/dL — ABNORMAL LOW (ref 6.0–8.3)

## 2012-09-15 LAB — CBC
HCT: 23.4 % — ABNORMAL LOW (ref 36.0–46.0)
MCHC: 32.9 g/dL (ref 30.0–36.0)
RDW: 16.1 % — ABNORMAL HIGH (ref 11.5–15.5)

## 2012-09-15 LAB — PHOSPHORUS: Phosphorus: 2.3 mg/dL (ref 2.3–4.6)

## 2012-09-15 MED ORDER — DARBEPOETIN ALFA-POLYSORBATE 100 MCG/0.5ML IJ SOLN
INTRAMUSCULAR | Status: AC
  Administered 2012-09-15: 100 ug via INTRAVENOUS
  Filled 2012-09-15: qty 0.5

## 2012-09-15 MED ORDER — CARVEDILOL 25 MG PO TABS
25.0000 mg | ORAL_TABLET | Freq: Two times a day (BID) | ORAL | Status: DC
  Administered 2012-09-15 – 2012-09-16 (×3): 25 mg via ORAL
  Filled 2012-09-15 (×6): qty 1

## 2012-09-15 MED ORDER — CHLORHEXIDINE GLUCONATE 0.12 % MT SOLN
15.0000 mL | Freq: Two times a day (BID) | OROMUCOSAL | Status: DC
  Administered 2012-09-15 (×2): 15 mL via OROMUCOSAL
  Filled 2012-09-15 (×5): qty 15

## 2012-09-15 MED ORDER — DOXAZOSIN MESYLATE 4 MG PO TABS
4.0000 mg | ORAL_TABLET | Freq: Every day | ORAL | Status: DC
  Administered 2012-09-15: 4 mg via ORAL
  Filled 2012-09-15 (×2): qty 1

## 2012-09-15 MED ORDER — CLONIDINE HCL 0.2 MG PO TABS: 0.2000 mg | ORAL_TABLET | Freq: Two times a day (BID) | ORAL | Status: DC

## 2012-09-15 MED ORDER — LIDOCAINE-PRILOCAINE 2.5-2.5 % EX CREA: 1.0000 "application " | TOPICAL_CREAM | CUTANEOUS | Status: DC | PRN

## 2012-09-15 MED ORDER — PENTAFLUOROPROP-TETRAFLUOROETH EX AERO: 1.0000 "application " | INHALATION_SPRAY | CUTANEOUS | Status: DC | PRN

## 2012-09-15 MED ORDER — CARVEDILOL 25 MG PO TABS
25.0000 mg | ORAL_TABLET | Freq: Two times a day (BID) | ORAL | Status: DC
  Filled 2012-09-15 (×2): qty 1

## 2012-09-15 MED ORDER — DOXAZOSIN MESYLATE 4 MG PO TABS
4.0000 mg | ORAL_TABLET | Freq: Every day | ORAL | Status: DC
  Filled 2012-09-15: qty 1

## 2012-09-15 MED ORDER — SODIUM CHLORIDE 0.9 % IV SOLN: 100.0000 mL | INTRAVENOUS | Status: DC | PRN

## 2012-09-15 MED ORDER — DARBEPOETIN ALFA-POLYSORBATE 100 MCG/0.5ML IJ SOLN
100.0000 ug | INTRAMUSCULAR | Status: DC
  Filled 2012-09-15: qty 0.5

## 2012-09-15 MED ORDER — BIOTENE DRY MOUTH MT LIQD
15.0000 mL | Freq: Two times a day (BID) | OROMUCOSAL | Status: DC
  Administered 2012-09-15 (×2): 15 mL via OROMUCOSAL

## 2012-09-15 MED ORDER — LISINOPRIL 20 MG PO TABS
20.0000 mg | ORAL_TABLET | Freq: Two times a day (BID) | ORAL | Status: DC
  Administered 2012-09-15 – 2012-09-16 (×2): 20 mg via ORAL
  Filled 2012-09-15 (×4): qty 1

## 2012-09-15 MED ORDER — HYDRALAZINE HCL 100 MG PO TABS: 100.0000 mg | ORAL_TABLET | Freq: Three times a day (TID) | ORAL | Status: DC

## 2012-09-15 MED ORDER — AMLODIPINE BESYLATE 10 MG PO TABS
10.0000 mg | ORAL_TABLET | Freq: Every day | ORAL | Status: DC
  Administered 2012-09-15 – 2012-09-16 (×2): 10 mg via ORAL
  Filled 2012-09-15 (×2): qty 1

## 2012-09-15 MED ORDER — HEPARIN SODIUM (PORCINE) 1000 UNIT/ML DIALYSIS: 1000.0000 [IU] | INTRAMUSCULAR | Status: DC | PRN

## 2012-09-15 MED ORDER — NIFEDIPINE ER 60 MG PO TB24
60.0000 mg | ORAL_TABLET | Freq: Two times a day (BID) | ORAL | Status: DC
Start: 2012-09-15 — End: 2012-09-15
  Filled 2012-09-15 (×2): qty 1

## 2012-09-15 MED ORDER — LABETALOL HCL 200 MG PO TABS
400.0000 mg | ORAL_TABLET | Freq: Two times a day (BID) | ORAL | Status: DC
  Administered 2012-09-15 – 2012-09-16 (×2): 400 mg via ORAL
  Filled 2012-09-15 (×3): qty 2

## 2012-09-15 MED ORDER — NEPRO/CARBSTEADY PO LIQD: 237.0000 mL | ORAL | Status: DC | PRN

## 2012-09-15 MED ORDER — CLONIDINE HCL 0.2 MG PO TABS
0.2000 mg | ORAL_TABLET | Freq: Three times a day (TID) | ORAL | Status: DC
  Administered 2012-09-15 – 2012-09-16 (×3): 0.2 mg via ORAL
  Filled 2012-09-15 (×6): qty 1

## 2012-09-15 MED ORDER — HEPARIN SODIUM (PORCINE) 1000 UNIT/ML DIALYSIS
100.0000 [IU]/kg | INTRAMUSCULAR | Status: DC | PRN
  Administered 2012-09-15: 5500 [IU] via INTRAVENOUS_CENTRAL

## 2012-09-15 MED ORDER — ALLOPURINOL 100 MG PO TABS
100.0000 mg | ORAL_TABLET | Freq: Every day | ORAL | Status: DC
  Filled 2012-09-15 (×2): qty 1

## 2012-09-15 MED ORDER — DARBEPOETIN ALFA-POLYSORBATE 100 MCG/0.5ML IJ SOLN: 100.0000 ug | INTRAMUSCULAR | Status: DC

## 2012-09-15 MED ORDER — ALTEPLASE 2 MG IJ SOLR
2.0000 mg | Freq: Once | INTRAMUSCULAR | Status: DC | PRN
  Filled 2012-09-15: qty 2

## 2012-09-15 MED ORDER — LIDOCAINE HCL (PF) 1 % IJ SOLN: 5.0000 mL | INTRAMUSCULAR | Status: DC | PRN

## 2012-09-15 MED ORDER — HYDRALAZINE HCL 50 MG PO TABS
100.0000 mg | ORAL_TABLET | Freq: Three times a day (TID) | ORAL | Status: DC
  Administered 2012-09-15 – 2012-09-16 (×3): 100 mg via ORAL
  Filled 2012-09-15 (×6): qty 2

## 2012-09-15 NOTE — Progress Notes (Signed)
Subjective: Interval History: has complaints SOB.  Objective: Vital signs in last 24 hours: Temp:  [97.2 F (36.2 C)-98.6 F (37 C)] 98.1 F (36.7 C) (07/04 0749) Pulse Rate:  [61-88] 72 (07/04 0800) Resp:  [16-37] 24 (07/04 0800) BP: (121-208)/(36-155) 184/53 mmHg (07/04 0800) SpO2:  [96 %-100 %] 100 % (07/04 0800) FiO2 (%):  [40 %] 40 % (07/03 2311) Weight:  [54.1 kg (119 lb 4.3 oz)-54.7 kg (120 lb 9.5 oz)] 54.7 kg (120 lb 9.5 oz) (07/04 0452) Weight change:   Intake/Output from previous day: 07/03 0701 - 07/04 0700 In: 376.5 [P.O.:240; I.V.:136.5] Out: 2900  Intake/Output this shift: Total I/O In: 10.5 [I.V.:10.5] Out: -   General appearance: cooperative, mild distress, pale and breathing with some difficutlty Resp: diminished breath sounds bilaterally and rales bibasilar Cardio: S1, S2 normal and systolic murmur: holosystolic 2/6, blowing at apex GI: pos bs, liver down 4 cm Extremities: edema 1+  Lab Results:  Recent Labs  09/14/12 0456 09/14/12 0912  WBC 10.2 7.7  HGB 9.7* 9.2*  HCT 30.1* 28.1*  PLT 171 165   BMET:  Recent Labs  09/14/12 0456 09/14/12 0912  NA 140  --   K 4.3  --   CL 101  --   CO2 22  --   GLUCOSE 172*  --   BUN 35*  --   CREATININE 6.63* 7.05*  CALCIUM 9.2  --    No results found for this basename: PTH,  in the last 72 hours Iron Studies: No results found for this basename: IRON, TIBC, TRANSFERRIN, FERRITIN,  in the last 72 hours  Studies/Results: Dg Chest Port 1 View  09/14/2012   *RADIOLOGY REPORT*  Clinical Data: Short of breath.  PORTABLE CHEST - 1 VIEW  Comparison: 11/21/2011.  Findings: The right IJ dialysis catheter has been removed.  Low lung volumes are present.  Pulmonary vascular congestion is present with interstitial pulmonary edema at the bases.  Bilateral basilar atelectasis. Monitoring leads are projected over the chest.  No focal consolidation. Size of the cardiopericardial silhouette is within normal limits.   IMPRESSION: Low volume chest with basilar atelectasis and interstitial pulmonary edema suggesting volume overload.   Original Report Authenticated By: Andreas Newport, M.D.    I have reviewed the patient's current medications.  Assessment/Plan: 1 CRF vol xs, dyspneic. Repeat HD. Some component of dia dysfunction 2 Anemia add epo 3 HTN lower vol, change meds 4 Dm controlled. P HD , epo, change meds    LOS: 1 day   Rae Sutcliffe L 09/15/2012,9:20 AM

## 2012-09-15 NOTE — Progress Notes (Addendum)
Pt transported to HD alert and oriented without questions or concerns.  Renal doc at bedside with pt, acknowledged BP since Coreg administration, no additional interventions ordered.  Pt refused 1200 dose of insulin.

## 2012-09-15 NOTE — Progress Notes (Addendum)
[facility] Progress Note [place] TEAM 1 - Stepdown/ICU TEAM   Alexandra [name] [place] ZOX:[id number] DOB: August 17, 1945 DOA: 09/14/2012 PCP: Provider Not In System  Brief narrative: Alexandra [name] is a 67 y.o. female with known history of ESRD on hemodialysis, diabetes mellitus type 2, hypertension, gout, anemia was brought to the ER because of shortness of breath patient states that she became suddenly short of breath last night. Denies any chest pain or any fever chills productive cough. Chest x-ray revealed features consistent with pulmonary edema and patient's blood pressure was found to be very elevated. Patient has been placed on BiPAP and IV nitroglycerin was started and also was given IV Lasix and on-call nephrologist was consulted. Patient gets dialyzed on Tuesday Thursday and Saturday and states she did not Miss her dialysis. Patient otherwise denies any nausea vomiting abdominal pain diarrhea. [place]   Assessment/Plan: Principal Problem:   Acute pulmonary edema/acute on chronic diastolic heart failure/acute respiratory failure - dialyzed yesterday  - still with fluid overload/ will need to be dialyzed again today  Active Problems:    Hypertension, accelerated - likely compounded by fluid overload - currently on Nitro infusion - home meds resumed but held due to dialysis today    ESRD on dialysis - per nephro team    Diabetes mellitus -Per patient her diabetes has resolved and she no longer takes any medication for it  Code Status: Full code Family Communication: None Disposition Plan: Follow in step down unit until adequately dialyzed and until blood pressure improved  Consultants: Nephrology  Procedures: None  Antibiotics: None  DVT prophylaxis: Lovenox  HPI/Subjective: Patient noted to be on 4 L of oxygen today. When I turn it off to check her room air oxygen saturations, she complained of severe shortness  of breath and asked for her BiPAP back. According to patient blood pressure medications and dosages were not updated-I reviewed these with her myself today and have updated them. No other complaints   Objective: Blood pressure 165/60, pulse 64, temperature 97.5 F (36.4 C), temperature source Oral, resp. rate 21, height 5\' 8"  (1.727 m), weight 55.5 kg (122 lb 5.7 oz), last menstrual period 09/27/2011, SpO2 95.00%.  Intake/Output Summary (Last 24 hours) at 09/15/12 1234 Last data filed at 09/15/12 1200  Gross per 24 hour  Intake  544.5 ml  Output   2900 ml  Net -2355.5 ml     Exam: General: Awake alert oriented x3 Lungs: Crackles at bilateral bases but worse in the left Cardiovascular: Regular rate and rhythm without murmur gallop or rub normal S1 and S2 Abdomen: Nontender, nondistended, soft, bowel sounds positive, no rebound, no ascites, no appreciable mass Extremities: No significant cyanosis, clubbing, or edema bilateral lower extremities  Data Reviewed: Basic Metabolic Panel:  Recent Labs Lab 09/14/12 0456 09/14/12 0912  NA 140  --   K 4.3  --   CL 101  --   CO2 22  --   GLUCOSE 172*  --   BUN 35*  --   CREATININE 6.63* 7.05*  CALCIUM 9.2  --    Liver Function Tests: No results found for this basename: AST, ALT, ALKPHOS, BILITOT, PROT, ALBUMIN,  in the last 168 hours No results found for this basename: LIPASE, AMYLASE,  in the last 168 hours No results found for this basename: AMMONIA,  in the last 168 hours CBC:  Recent Labs Lab 09/14/12 0456 09/14/12 0912  WBC 10.2 7.7  HGB  9.7* 9.2*  HCT 30.1* 28.1*  MCV 91.8 92.1  PLT 171 165   Cardiac Enzymes: No results found for this basename: CKTOTAL, CKMB, CKMBINDEX, TROPONINI,  in the last 168 hours BNP (last 3 results)  Recent Labs  10/15/11 0707 09/14/12 0912  PROBNP 31669.0* 56465.0*   CBG:  Recent Labs Lab 09/14/12 1147 09/14/12 1835 09/14/12 2129 09/15/12 0750 09/15/12 1159  GLUCAP 193* 214*  85 117* 181*    No results found for this or any previous visit (from the past 240 hour(s)).   Studies:  Recent x-ray studies have been reviewed in detail by the Attending Physician  Scheduled Meds:  Scheduled Meds: . allopurinol  100 mg Oral QHS  . amLODipine  10 mg Oral QHS  . antiseptic oral rinse  15 mL Mouth Rinse q12n4p  . calcium carbonate  1 tablet Oral TID WC  . carvedilol  25 mg Oral BID WC  . chlorhexidine  15 mL Mouth Rinse BID  . cloNIDine  0.2 mg Oral TID  . darbepoetin (ARANESP) injection - DIALYSIS  100 mcg Intravenous Q Thu-HD  . docusate sodium  200 mg Oral QHS  . doxazosin  4 mg Oral QHS  . enoxaparin (LOVENOX) injection  30 mg Subcutaneous Q24H  . hydrALAZINE  100 mg Oral Q8H  . insulin aspart  0-9 Units Subcutaneous TID WC  . lisinopril  20 mg Oral BID  . multivitamin  1 tablet Oral QHS  . sodium chloride  3 mL Intravenous Q12H  . sodium chloride  3 mL Intravenous Q12H   Continuous Infusions: . nitroGLYCERIN 35 mcg/min (09/15/12 1610)    Time spent on care of this patient: 35 minutes   Jhalen Eley,MD  [facility] Office  [phone] Pager - Text Page per Loretha Stapler as per below:  On-Call/Text Page:      [email or web address]      password [email or web address]  If 7PM-7AM, please contact night-coverage [email or web address] Password [email or web address] 09/15/2012, 12:34 PM   LOS: 1 day

## 2012-09-15 NOTE — Progress Notes (Addendum)
Renal MD placed HD orders for today.  Nurse requested clarity regarding scheduled 1000 BP medication, currently systolic BP >200.  MD informed nurse to only administer Coreg at this time, all other BP meds should be held due to scheduled HD today.  MD instructed nurse to contacted MD if systolic BP remains >180.  Nurse will carry out orders as instructed and will continue to monitor pt.  Hospitalist MD also made aware

## 2012-09-15 NOTE — Progress Notes (Signed)
Pt desat to high 80's during exertion on bedpan.  Pt requested supplemental oxygen be replaced, oxygen was replaced at 2L [place].  Nurse will continue to monitor.

## 2012-09-15 NOTE — Progress Notes (Addendum)
  0800:  MD returned nurse page and informed nurse that pt home med's will be restarted here in facility to better control BP, instructed nurse that Nitro should remain on at current rate for now.  Nurse will continue to monitor  0756:  Nurse text paged MD to inform that pt systolic BP >180 on Nitro drip running at 10.5, oral BP medications are not due until 1000, pt is asymptomatic.  Nurse asked MD for clarification regarding titration of Nitro drip for BP,  Current orders list parameters in relation to Chest pain management.  Nurse will continue to monitor.

## 2012-09-15 NOTE — Progress Notes (Signed)
Nurse received report for HD nurse regarding pt.  Nurse was informed of BP while in HD.  Nurse contacted Renal MD due to current systolic BP >200.  MD instructed nurse to administer 1600 dose of Coreg and Clonidine when pt returned from HD, other medications should be administered as currently scheduled.  Nurse will administer as instructed and will continue to monitor.

## 2012-09-15 NOTE — Progress Notes (Signed)
Pt had brief episode of coughing, pt oxygen saturations remained > 95%, however pt demanded to placed back on BIPAP.  MD made aware

## 2012-09-15 NOTE — Procedures (Signed)
I was present at this session.  I have reviewed the session itself and made appropriate changes.  Vol xs. Machine not set right, corrected.    Shavaun Osterloh L 7/4/201412:45 PM

## 2012-09-16 ENCOUNTER — Other Ambulatory Visit (HOSPITAL_COMMUNITY): Payer: Self-pay | Admitting: Internal Medicine

## 2012-09-16 DIAGNOSIS — J811 Chronic pulmonary edema: Secondary | ICD-10-CM

## 2012-09-16 DIAGNOSIS — N039 Chronic nephritic syndrome with unspecified morphologic changes: Secondary | ICD-10-CM

## 2012-09-16 LAB — CBC
HCT: 25.5 % — ABNORMAL LOW (ref 36.0–46.0)
Hemoglobin: 8.4 g/dL — ABNORMAL LOW (ref 12.0–15.0)
MCH: 29.8 pg (ref 26.0–34.0)
MCHC: 32.9 g/dL (ref 30.0–36.0)
MCV: 90.4 fL (ref 78.0–100.0)
Platelets: 173 10*3/uL (ref 150–400)
RBC: 2.82 MIL/uL — ABNORMAL LOW (ref 3.87–5.11)
RDW: 15.7 % — ABNORMAL HIGH (ref 11.5–15.5)
WBC: 5.1 10*3/uL (ref 4.0–10.5)

## 2012-09-16 LAB — GLUCOSE, CAPILLARY
Glucose-Capillary: 108 mg/dL — ABNORMAL HIGH (ref 70–99)
Glucose-Capillary: 128 mg/dL — ABNORMAL HIGH (ref 70–99)

## 2012-09-16 MED ORDER — HEPARIN SODIUM (PORCINE) 1000 UNIT/ML DIALYSIS: 1000.0000 [IU] | INTRAMUSCULAR | Status: DC | PRN

## 2012-09-16 MED ORDER — LIDOCAINE HCL (PF) 1 % IJ SOLN: 5.0000 mL | INTRAMUSCULAR | Status: DC | PRN

## 2012-09-16 MED ORDER — ALTEPLASE 2 MG IJ SOLR: 2.0000 mg | Freq: Once | INTRAMUSCULAR | Status: DC | PRN

## 2012-09-16 MED ORDER — SODIUM CHLORIDE 0.9 % IV SOLN: 100.0000 mL | INTRAVENOUS | Status: DC | PRN

## 2012-09-16 MED ORDER — PENTAFLUOROPROP-TETRAFLUOROETH EX AERO: 1.0000 "application " | INHALATION_SPRAY | CUTANEOUS | Status: DC | PRN

## 2012-09-16 MED ORDER — AMLODIPINE BESYLATE 10 MG PO TABS: 10.0000 mg | ORAL_TABLET | Freq: Every day | ORAL | Status: DC

## 2012-09-16 MED ORDER — HEPARIN SODIUM (PORCINE) 1000 UNIT/ML DIALYSIS: 40.0000 [IU]/kg | Freq: Once | INTRAMUSCULAR | Status: DC

## 2012-09-16 MED ORDER — NEPRO/CARBSTEADY PO LIQD: 237.0000 mL | ORAL | Status: DC | PRN

## 2012-09-16 MED ORDER — AMLODIPINE BESYLATE 10 MG PO TABS: 10.0000 mg | ORAL_TABLET | Freq: Every day | ORAL | Status: AC

## 2012-09-16 MED ORDER — LIDOCAINE-PRILOCAINE 2.5-2.5 % EX CREA: 1.0000 "application " | TOPICAL_CREAM | CUTANEOUS | Status: DC | PRN

## 2012-09-16 MED ORDER — LABETALOL HCL 200 MG PO TABS: 400.0000 mg | ORAL_TABLET | Freq: Two times a day (BID) | ORAL | Status: DC

## 2012-09-16 NOTE — Progress Notes (Signed)
Discharge instructor discussed and reviewed with pt, and pt verbalized understanding. Pt refused the labetalol prescription slip.

## 2012-09-16 NOTE — Progress Notes (Signed)
Subjective: Interval History: has no complaint of SOB as yest, thankful for getting better.  Objective: Vital signs in last 24 hours: Temp:  [97.5 F (36.4 C)-99.3 F (37.4 C)] 98.5 F (36.9 C) (07/05 0000) Pulse Rate:  [53-80] 66 (07/05 0300) Resp:  [17-28] 22 (07/05 0300) BP: (133-211)/(33-76) 181/49 mmHg (07/05 0528) SpO2:  [92 %-100 %] 100 % (07/05 0300) Weight:  [51.1 kg (112 lb 10.5 oz)-55.5 kg (122 lb 5.7 oz)] 51.1 kg (112 lb 10.5 oz) (07/04 1646) Weight change: -0.5 kg (-1 lb 1.6 oz)  Intake/Output from previous day: 07/04 0701 - 07/05 0700 In: 540 [P.O.:360; I.V.:180] Out: 4504  Intake/Output this shift:    General appearance: cooperative, pale and slowed mentation Resp: diminished breath sounds bilaterally and rales bibasilar Cardio: regular rate and rhythm and systolic murmur: holosystolic 2/6, blowing at apex GI: abnormal findings:  liver down 4 cm pos  bs,soft Extremities: AVF RUA  Lab Results:  Recent Labs  09/14/12 0912 09/15/12 1218  WBC 7.7 7.8  HGB 9.2* 7.7*  HCT 28.1* 23.4*  PLT 165 144*   BMET:  Recent Labs  09/14/12 0456 09/14/12 0912 09/15/12 1218  NA 140  --  139  K 4.3  --  3.5  CL 101  --  101  CO2 22  --  28  GLUCOSE 172*  --  192*  BUN 35*  --  22  CREATININE 6.63* 7.05* 4.15*  CALCIUM 9.2  --  8.1*   No results found for this basename: PTH,  in the last 72 hours Iron Studies: No results found for this basename: IRON, TIBC, TRANSFERRIN, FERRITIN,  in the last 72 hours  Studies/Results: No results found.  I have reviewed the patient's current medications.  Assessment/Plan: 1 ESRD reg HD today ,lower vol 2 HTN better with less vol.  Cont meds , HD 3 CHF vol relate 4 Anemia lower HB, recheck, no obvious bleeding 5 DM controlled 6 HPTH P HD, if ok post, d/c.  F/u Hb if lower,eval    LOS: 2 days   Alexandra [name] L 09/16/2012,8:26 AM

## 2012-09-16 NOTE — Discharge Summary (Signed)
Physician Discharge Summary  Alexandra [name] RUE:[id number] DOB: 09/04/1945 DOA: 09/14/2012  PCP: Provider Not In System  Admit date: 09/14/2012 Discharge date: 09/16/2012  Time spent: 45 minutes  Discharge Diagnoses:  Principal Problem:   Acute pulmonary edema Active Problems:   ESRD on dialysis   Diabetes mellitus   Hypertension, accelerated   Discharge Condition: stable  Diet recommendation: heart healthy/ renal diet  Filed Weights   09/15/12 1211 09/15/12 1646 09/16/12 1403  Weight: 55.5 kg (122 lb 5.7 oz) 51.1 kg (112 lb 10.5 oz) 43.6 kg (96 lb 1.9 oz)    History of present illness:  Alexandra [name] is a 67 y.o. female with known history of ESRD on hemodialysis, diabetes mellitus type 2, hypertension, gout, anemia was brought to the ER because of shortness of breath patient states that she became suddenly short of breath last night. Denies any chest pain or any fever chills productive cough. Chest x-ray revealed features consistent with pulmonary edema and patient's blood pressure was found to be very elevated. Patient has been placed on BiPAP and IV nitroglycerin was started and also was given IV Lasix and on-call nephrologist was consulted. Patient gets dialyzed on Tuesday Thursday and Saturday and states she did not Miss her dialysis. Patient otherwise denies any nausea vomiting abdominal pain diarrhea. [place]   Hospital Course:  Acute pulmonary edema/acute on chronic diastolic heart failure/acute respiratory failure  - dialyzed daily for 3 days in a row  Active Problems:  Hypertension, accelerated  - likely compounded by fluid overload  - resume home meds except change Nifedipine to Norvasc due to patient's request  ESRD on dialysis  - managed by dialysis  Diabetes mellitus  -Per patient her diabetes has resolved and she no longer takes any medication for it    Consultations:  Nephrology  Discharge Exam: Filed  Vitals:   09/16/12 1543 09/16/12 1555 09/16/12 1600 09/16/12 1630  BP: 152/40 126/40 153/40 161/114  Pulse: 72 73 95 78  Temp:      TempSrc:      Resp: 18 22 20 26   Height:      Weight:      SpO2:        General: AAO x 3 Cardiovascular: RRR, no murmurs Respiratory: CTA b/l   Discharge Instructions      Discharge Orders   Future Orders Complete By Expires     Diet - low sodium heart healthy  As directed     Comments:      Renal diet    Increase activity slowly  As directed     Increase activity slowly  As directed         Medication List    STOP taking these medications       NIFEdipine 60 MG 24 hr tablet  Commonly known as:  PROCARDIA XL/ADALAT-CC      TAKE these medications       amLODipine 10 MG tablet  Commonly known as:  NORVASC  Take 1 tablet (10 mg total) by mouth at bedtime.     calcium carbonate 500 MG chewable tablet  Commonly known as:  TUMS - dosed in mg elemental calcium  Chew 1 tablet by mouth 3 (three) times daily before meals.     cloNIDine 0.2 MG tablet  Commonly known as:  CATAPRES  Take 0.2 mg by mouth 2 (two) times daily.     Coenzyme Q10 300 MG Caps  Take 300  mg by mouth daily.     docusate sodium 100 MG capsule  Commonly known as:  COLACE  Take 200 mg by mouth at bedtime.     doxazosin 4 MG tablet  Commonly known as:  CARDURA  Take 4 mg by mouth daily.     hydrALAZINE 100 MG tablet  Commonly known as:  APRESOLINE  Take 100 mg by mouth 3 (three) times daily.     labetalol 300 MG tablet  Commonly known as:  NORMODYNE  Take 300 mg by mouth 2 (two) times daily.     labetalol 100 MG tablet  Commonly known as:  NORMODYNE  Take 100 mg by mouth 2 (two) times daily.     lisinopril 20 MG tablet  Commonly known as:  PRINIVIL,ZESTRIL  Take 20 mg by mouth 2 (two) times daily.     OVER THE COUNTER MEDICATION  Take 1 tablet by mouth daily. Renal multivitamin       Allergies  Allergen Reactions  . Codeine Other (See Comments)     Passes out  . Epinephrine Other (See Comments)    Tachycardia, diaphoresis, syncope  . Percocet (Oxycodone-Acetaminophen) Other (See Comments)    Passes  out  . Statins Other (See Comments)    Liver enzymes rise   PATIENT DOES NOT WANT TO BE GIVEN THIS TYPE OF MEDICATION PER THE ADVISE OF [facility] PHYSICIANS      The results of significant diagnostics from this hospitalization (including imaging, microbiology, ancillary and laboratory) are listed below for reference.    Significant Diagnostic Studies: Dg Chest Port 1 View  09/14/2012   *RADIOLOGY REPORT*  Clinical Data: Short of breath.  PORTABLE CHEST - 1 VIEW  Comparison: 11/21/2011.  Findings: The right IJ dialysis catheter has been removed.  Low lung volumes are present.  Pulmonary vascular congestion is present with interstitial pulmonary edema at the bases.  Bilateral basilar atelectasis. Monitoring leads are projected over the chest.  No focal consolidation. Size of the cardiopericardial silhouette is within normal limits.  IMPRESSION: Low volume chest with basilar atelectasis and interstitial pulmonary edema suggesting volume overload.   Original Report Authenticated By: Andreas Newport, M.D.    Microbiology: No results found for this or any previous visit (from the past 240 hour(s)).   Labs: Basic Metabolic Panel:  Recent Labs Lab 09/14/12 0456 09/14/12 0912 09/15/12 1218  NA 140  --  139  K 4.3  --  3.5  CL 101  --  101  CO2 22  --  28  GLUCOSE 172*  --  192*  BUN 35*  --  22  CREATININE 6.63* 7.05* 4.15*  CALCIUM 9.2  --  8.1*  PHOS  --   --  2.3   Liver Function Tests:  Recent Labs Lab 09/15/12 1218  AST 24  ALT 16  ALKPHOS 120*  BILITOT 0.5  PROT 5.6*  ALBUMIN 2.5*   No results found for this basename: LIPASE, AMYLASE,  in the last 168 hours No results found for this basename: AMMONIA,  in the last 168 hours CBC:  Recent Labs Lab 09/14/12 0456 09/14/12 0912 09/15/12 1218  09/16/12 1420  WBC 10.2 7.7 7.8 5.1  HGB 9.7* 9.2* 7.7* 8.4*  HCT 30.1* 28.1* 23.4* 25.5*  MCV 91.8 92.1 91.4 90.4  PLT 171 165 144* 173   Cardiac Enzymes: No results found for this basename: CKTOTAL, CKMB, CKMBINDEX, TROPONINI,  in the last 168 hours BNP: BNP (last 3 results)  Recent  Labs  10/15/11 0707 09/14/12 0912  PROBNP 31669.0* 56465.0*   CBG:  Recent Labs Lab 09/15/12 1159 09/15/12 1712 09/15/12 2126 09/16/12 0749 09/16/12 1247  GLUCAP 181* 141* 256* 108* 128*       Signed:  Lorrain Rivers  [facility] 09/16/2012, 4:39 PM

## 2012-09-29 NOTE — Telephone Encounter (Signed)
Medication refill

## 2012-11-02 IMAGING — CR DG CHEST 2V
1 series · 1 of 1 positions shown · non-contrast
Comparison: 01/16/2010

***ADDENDUM*** CREATED: 07/15/2011 [DATE]

Requested addendum:
No radiographic evidence of TB identified.
CLINICAL DATA: Dialysis patient, history chronic kidney disease,
diabetes, hypertension, hyperlipidemia, gout
CHEST - 2 VIEW

[w chest lat]
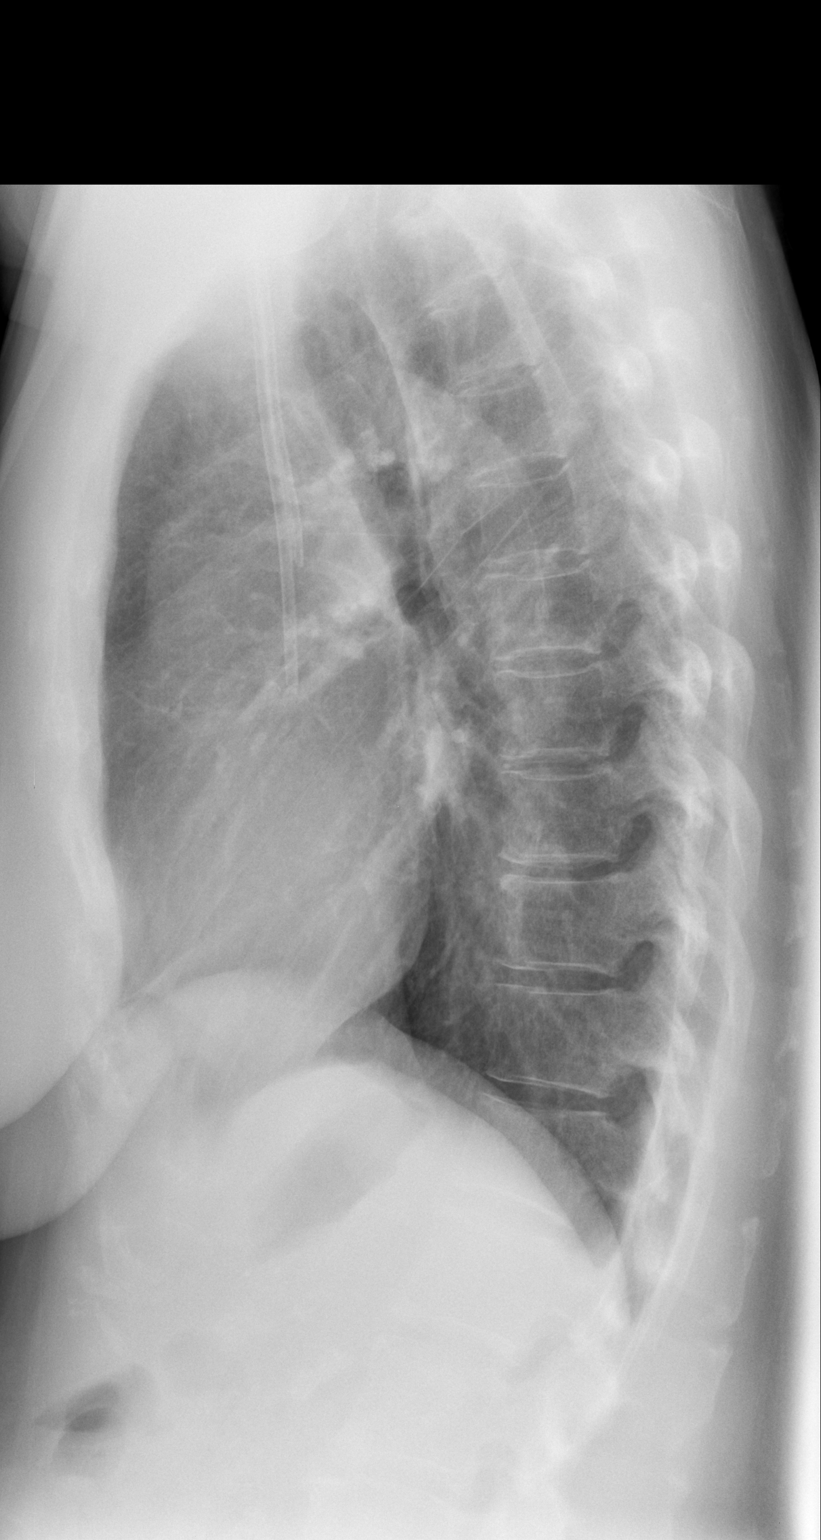

[1 of 1 positions shown; findings below may reference images not displayed]

FINDINGS: Right jugular central venous catheter, tip projecting over SVC.
Upper-normal size of cardiac silhouette.
Mediastinal contours and pulmonary vascularity normal.
Lungs appear emphysematous but clear.
No pleural effusion or pneumothorax.
Bones appear diffusely demineralized.
Minimal anterior height loss of a vertebra at the thoracolumbar
junction, unchanged.
IMPRESSION: Emphysematous changes.
No acute abnormalities.

## 2013-01-05 ENCOUNTER — Other Ambulatory Visit (HOSPITAL_COMMUNITY): Payer: Self-pay | Admitting: Internal Medicine

## 2013-03-04 IMAGING — CR DG HAND 2V*R*
2 series · 2 of 2 positions shown · non-contrast
Comparison: None.

CLINICAL DATA: Right hand swelling

RIGHT HAND - 2 VIEW

[PA]
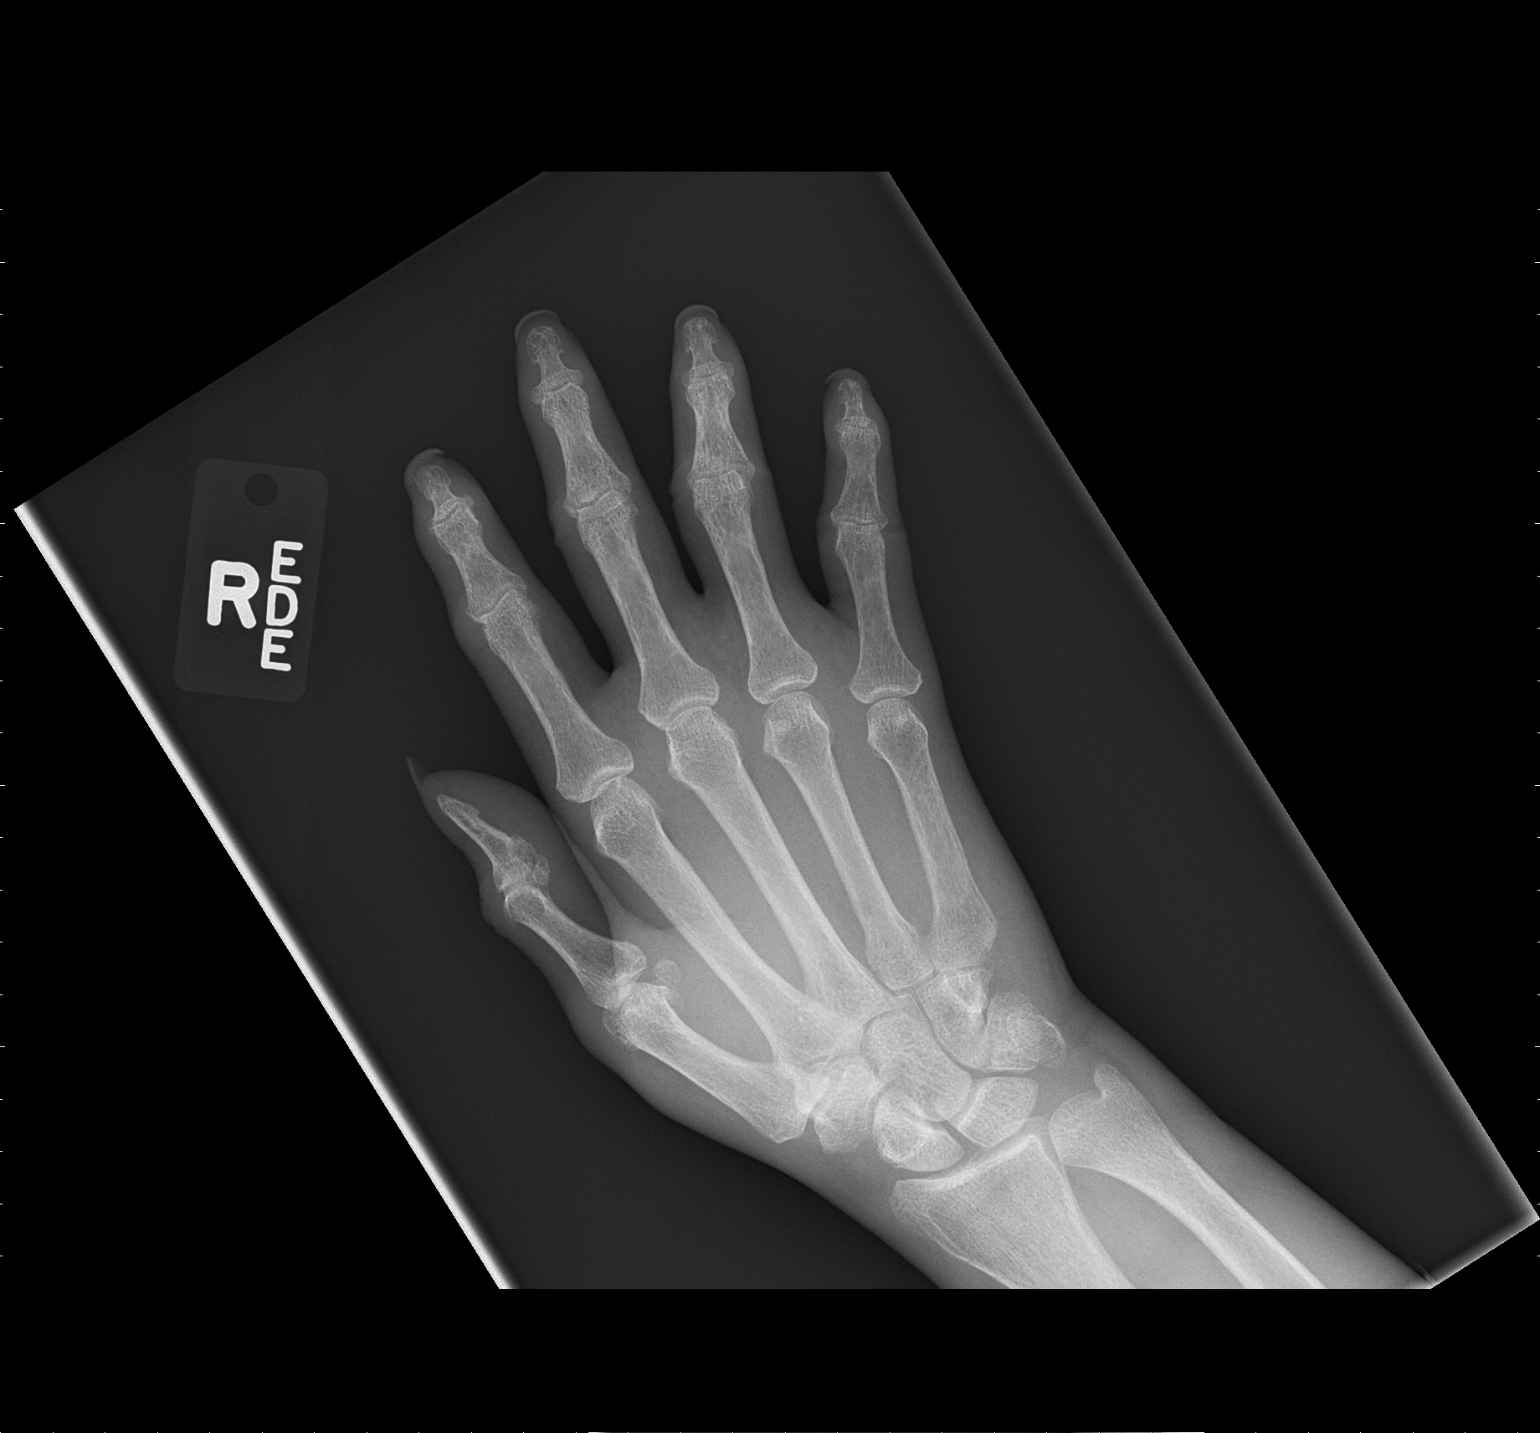

[lat hand]
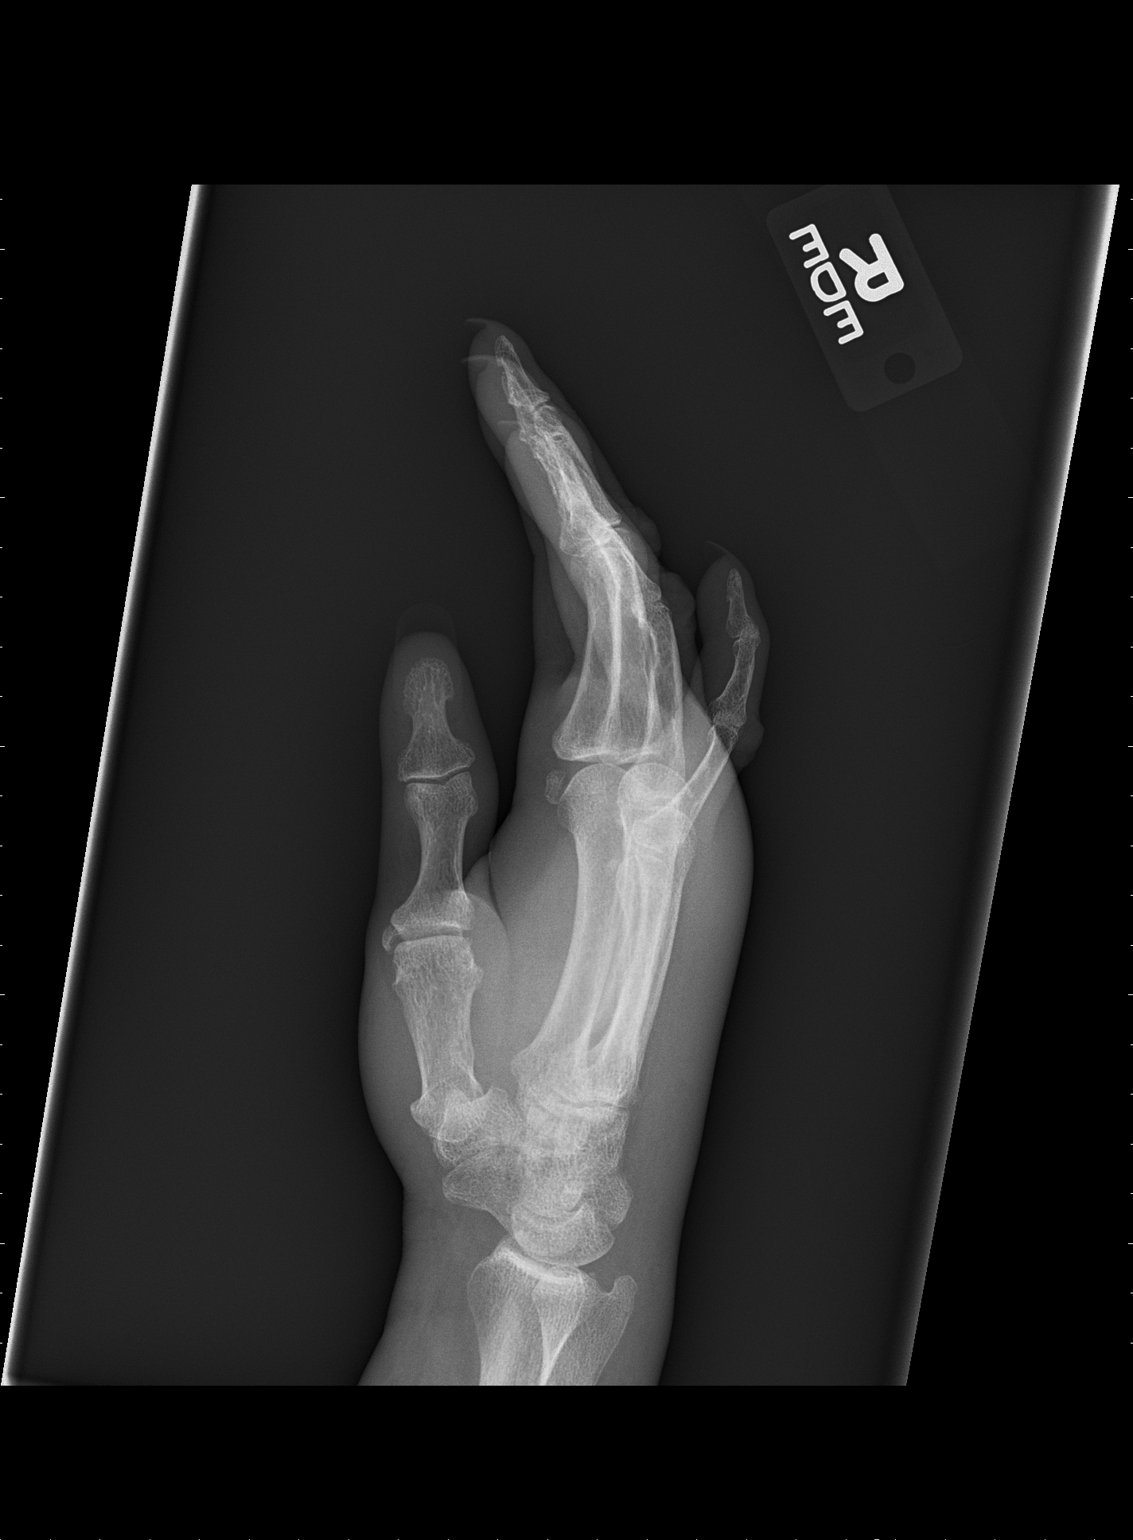

[2 of 2 positions shown; findings below may reference images not displayed]

FINDINGS: Mild degenerate changes are noted in the interphalangeal
joints.  No acute fracture or dislocation is seen.  Generalized
soft tissue swelling is noted about the metacarpals.
IMPRESSION: Generalized soft tissue swelling without acute bony abnormality.

## 2013-10-05 ENCOUNTER — Other Ambulatory Visit (HOSPITAL_COMMUNITY)
Admission: RE | Admit: 2013-10-05 | Discharge: 2013-10-05 | Disposition: A | Discharge status: Home / Self Care | Payer: Medicare HMO | Source: Ambulatory Visit | Attending: Family Medicine | Admitting: Family Medicine

## 2013-10-05 ENCOUNTER — Other Ambulatory Visit: Payer: Self-pay | Admitting: Family Medicine

## 2013-10-05 DIAGNOSIS — Z124 Encounter for screening for malignant neoplasm of cervix: Secondary | ICD-10-CM | POA: Insufficient documentation

## 2013-10-09 LAB — CYTOLOGY - PAP

## 2014-02-27 ENCOUNTER — Telehealth: Payer: Self-pay

## 2014-02-27 NOTE — Telephone Encounter (Signed)
Pt is calling in regards to a "personal matter". Refused to give me any information including her DOB. Says you know her well and she wants you to give her a call. (Sorry) Thanks.

## 2014-02-28 NOTE — Telephone Encounter (Signed)
Two things:  1. What is the callback number? 2. I have laryngitis and won't be calling back for a day or two.

## 2014-03-01 NOTE — Telephone Encounter (Signed)
She is aware of the message and uderstands it will be several days before you can call backl. She was just diagnosed with End stage kidney failure, and has a few questions for you.   [phone]

## 2014-03-28 ENCOUNTER — Encounter (HOSPITAL_COMMUNITY): Payer: Self-pay | Admitting: Surgery

## 2014-04-06 ENCOUNTER — Emergency Department (HOSPITAL_COMMUNITY): Payer: Medicare HMO

## 2014-04-06 ENCOUNTER — Encounter (HOSPITAL_COMMUNITY): Payer: Self-pay

## 2014-04-06 ENCOUNTER — Inpatient Hospital Stay (HOSPITAL_COMMUNITY)
Admission: EM | Admit: 2014-04-06 | Discharge: 2014-04-07 | DRG: 682 | Disposition: A | Discharge status: Home / Self Care | Payer: Medicare HMO | Attending: Internal Medicine | Admitting: Internal Medicine

## 2014-04-06 DIAGNOSIS — E119 Type 2 diabetes mellitus without complications: Secondary | ICD-10-CM

## 2014-04-06 DIAGNOSIS — F17210 Nicotine dependence, cigarettes, uncomplicated: Secondary | ICD-10-CM | POA: Diagnosis present

## 2014-04-06 DIAGNOSIS — M109 Gout, unspecified: Secondary | ICD-10-CM | POA: Diagnosis present

## 2014-04-06 DIAGNOSIS — Z888 Allergy status to other drugs, medicaments and biological substances status: Secondary | ICD-10-CM | POA: Diagnosis not present

## 2014-04-06 DIAGNOSIS — J9601 Acute respiratory failure with hypoxia: Secondary | ICD-10-CM | POA: Diagnosis present

## 2014-04-06 DIAGNOSIS — Z992 Dependence on renal dialysis: Secondary | ICD-10-CM | POA: Diagnosis not present

## 2014-04-06 DIAGNOSIS — J811 Chronic pulmonary edema: Secondary | ICD-10-CM | POA: Diagnosis present

## 2014-04-06 DIAGNOSIS — I120 Hypertensive chronic kidney disease with stage 5 chronic kidney disease or end stage renal disease: Secondary | ICD-10-CM | POA: Diagnosis present

## 2014-04-06 DIAGNOSIS — N185 Chronic kidney disease, stage 5: Secondary | ICD-10-CM

## 2014-04-06 DIAGNOSIS — I5033 Acute on chronic diastolic (congestive) heart failure: Secondary | ICD-10-CM | POA: Diagnosis present

## 2014-04-06 DIAGNOSIS — J810 Acute pulmonary edema: Secondary | ICD-10-CM

## 2014-04-06 DIAGNOSIS — R0602 Shortness of breath: Secondary | ICD-10-CM

## 2014-04-06 DIAGNOSIS — N186 End stage renal disease: Secondary | ICD-10-CM | POA: Diagnosis present

## 2014-04-06 DIAGNOSIS — E785 Hyperlipidemia, unspecified: Secondary | ICD-10-CM | POA: Diagnosis present

## 2014-04-06 DIAGNOSIS — Z8673 Personal history of transient ischemic attack (TIA), and cerebral infarction without residual deficits: Secondary | ICD-10-CM

## 2014-04-06 DIAGNOSIS — Z885 Allergy status to narcotic agent status: Secondary | ICD-10-CM | POA: Diagnosis not present

## 2014-04-06 DIAGNOSIS — D649 Anemia, unspecified: Secondary | ICD-10-CM | POA: Diagnosis present

## 2014-04-06 DIAGNOSIS — N179 Acute kidney failure, unspecified: Secondary | ICD-10-CM | POA: Diagnosis present

## 2014-04-06 DIAGNOSIS — R0600 Dyspnea, unspecified: Secondary | ICD-10-CM | POA: Diagnosis present

## 2014-04-06 DIAGNOSIS — I10 Essential (primary) hypertension: Secondary | ICD-10-CM | POA: Diagnosis present

## 2014-04-06 DIAGNOSIS — I161 Hypertensive emergency: Secondary | ICD-10-CM | POA: Insufficient documentation

## 2014-04-06 DIAGNOSIS — I169 Hypertensive crisis, unspecified: Secondary | ICD-10-CM | POA: Diagnosis present

## 2014-04-06 LAB — I-STAT TROPONIN, ED: TROPONIN I, POC: 0.12 ng/mL — AB (ref 0.00–0.08)

## 2014-04-06 LAB — BASIC METABOLIC PANEL
Anion gap: 12 (ref 5–15)
BUN: 36 mg/dL — ABNORMAL HIGH (ref 6–23)
CALCIUM: 9.5 mg/dL (ref 8.4–10.5)
CHLORIDE: 101 mmol/L (ref 96–112)
CO2: 25 mmol/L (ref 19–32)
CREATININE: 7.69 mg/dL — AB (ref 0.50–1.10)
GFR calc Af Amer: 5 mL/min — ABNORMAL LOW (ref >90)
GFR calc non Af Amer: 5 mL/min — ABNORMAL LOW (ref >90)
Glucose, Bld: 147 mg/dL — ABNORMAL HIGH (ref 70–99)
Potassium: 4.2 mmol/L (ref 3.5–5.1)
Sodium: 138 mmol/L (ref 135–145)

## 2014-04-06 LAB — I-STAT CHEM 8, ED
BUN: 38 mg/dL — AB (ref 6–23)
CHLORIDE: 103 mmol/L (ref 96–112)
Calcium, Ion: 1.18 mmol/L (ref 1.13–1.30)
Creatinine, Ser: 7.2 mg/dL — ABNORMAL HIGH (ref 0.50–1.10)
GLUCOSE: 146 mg/dL — AB (ref 70–99)
HCT: 41 % (ref 36.0–46.0)
Hemoglobin: 13.9 g/dL (ref 12.0–15.0)
POTASSIUM: 4.1 mmol/L (ref 3.5–5.1)
Sodium: 138 mmol/L (ref 135–145)
TCO2: 24 mmol/L (ref 0–100)

## 2014-04-06 LAB — CBC
HCT: 35.9 % — ABNORMAL LOW (ref 36.0–46.0)
HEMOGLOBIN: 11.6 g/dL — AB (ref 12.0–15.0)
MCH: 28.8 pg (ref 26.0–34.0)
MCHC: 32.3 g/dL (ref 30.0–36.0)
MCV: 89.1 fL (ref 78.0–100.0)
Platelets: 186 10*3/uL (ref 150–400)
RBC: 4.03 MIL/uL (ref 3.87–5.11)
RDW: 16.3 % — AB (ref 11.5–15.5)
WBC: 11 10*3/uL — AB (ref 4.0–10.5)

## 2014-04-06 LAB — MRSA PCR SCREENING: MRSA BY PCR: NEGATIVE

## 2014-04-06 LAB — PROTIME-INR
INR: 1.1 (ref 0.00–1.49)
Prothrombin Time: 14.3 seconds (ref 11.6–15.2)

## 2014-04-06 LAB — HEPATITIS B SURFACE ANTIGEN: Hepatitis B Surface Ag: NEGATIVE

## 2014-04-06 MED ORDER — NICARDIPINE HCL IN NACL 20-0.86 MG/200ML-% IV SOLN
3.0000 mg/h | Freq: Once | INTRAVENOUS | Status: AC
  Administered 2014-04-06: 3 mg/h via INTRAVENOUS
  Filled 2014-04-06: qty 200

## 2014-04-06 MED ORDER — IPRATROPIUM-ALBUTEROL 0.5-2.5 (3) MG/3ML IN SOLN
RESPIRATORY_TRACT | Status: AC
  Administered 2014-04-06: 3 mL via RESPIRATORY_TRACT
  Filled 2014-04-06: qty 3

## 2014-04-06 MED ORDER — LIDOCAINE-PRILOCAINE 2.5-2.5 % EX CREA: 1.0000 "application " | TOPICAL_CREAM | CUTANEOUS | Status: DC | PRN

## 2014-04-06 MED ORDER — HYDRALAZINE HCL 100 MG PO TABS: 100.0000 mg | ORAL_TABLET | Freq: Three times a day (TID) | ORAL | Status: DC

## 2014-04-06 MED ORDER — IPRATROPIUM-ALBUTEROL 0.5-2.5 (3) MG/3ML IN SOLN
3.0000 mL | Freq: Once | RESPIRATORY_TRACT | Status: AC
  Administered 2014-04-06: 3 mL via RESPIRATORY_TRACT

## 2014-04-06 MED ORDER — PENTAFLUOROPROP-TETRAFLUOROETH EX AERO: 1.0000 "application " | INHALATION_SPRAY | CUTANEOUS | Status: DC | PRN

## 2014-04-06 MED ORDER — SENNA 8.6 MG PO TABS
1.0000 | ORAL_TABLET | Freq: Two times a day (BID) | ORAL | Status: DC
  Administered 2014-04-07: 8.6 mg via ORAL
  Filled 2014-04-06 (×3): qty 1

## 2014-04-06 MED ORDER — ACETAMINOPHEN 325 MG PO TABS: 650.0000 mg | ORAL_TABLET | Freq: Four times a day (QID) | ORAL | Status: DC | PRN

## 2014-04-06 MED ORDER — HYDRALAZINE HCL 50 MG PO TABS
100.0000 mg | ORAL_TABLET | Freq: Three times a day (TID) | ORAL | Status: DC
  Administered 2014-04-06 – 2014-04-07 (×3): 100 mg via ORAL
  Filled 2014-04-06 (×5): qty 2

## 2014-04-06 MED ORDER — ACETAMINOPHEN 650 MG RE SUPP: 650.0000 mg | Freq: Four times a day (QID) | RECTAL | Status: DC | PRN

## 2014-04-06 MED ORDER — SODIUM CHLORIDE 0.9 % IV SOLN: 100.0000 mL | INTRAVENOUS | Status: DC | PRN

## 2014-04-06 MED ORDER — SODIUM CHLORIDE 0.9 % IJ SOLN
3.0000 mL | INTRAMUSCULAR | Status: DC | PRN
  Administered 2014-04-07 (×2): 3 mL via INTRAVENOUS
  Filled 2014-04-06 (×2): qty 3

## 2014-04-06 MED ORDER — COENZYME Q10 300 MG PO CAPS: 300.0000 mg | ORAL_CAPSULE | Freq: Every day | ORAL | Status: DC

## 2014-04-06 MED ORDER — SODIUM CHLORIDE 0.9 % IJ SOLN
3.0000 mL | Freq: Two times a day (BID) | INTRAMUSCULAR | Status: DC
  Administered 2014-04-06: 3 mL via INTRAVENOUS

## 2014-04-06 MED ORDER — SODIUM CHLORIDE 0.9 % IV SOLN: 250.0000 mL | INTRAVENOUS | Status: DC | PRN

## 2014-04-06 MED ORDER — DOCUSATE SODIUM 100 MG PO CAPS
200.0000 mg | ORAL_CAPSULE | Freq: Every day | ORAL | Status: DC
  Filled 2014-04-06 (×2): qty 2

## 2014-04-06 MED ORDER — CALCIUM CARBONATE ANTACID 500 MG PO CHEW
1.0000 | CHEWABLE_TABLET | Freq: Three times a day (TID) | ORAL | Status: DC
  Administered 2014-04-06 – 2014-04-07 (×4): 200 mg via ORAL
  Filled 2014-04-06 (×5): qty 1

## 2014-04-06 MED ORDER — HYDRALAZINE HCL 20 MG/ML IJ SOLN
20.0000 mg | Freq: Once | INTRAMUSCULAR | Status: AC
  Administered 2014-04-06: 20 mg via INTRAVENOUS
  Filled 2014-04-06: qty 1

## 2014-04-06 MED ORDER — SORBITOL 70 % SOLN
30.0000 mL | Freq: Every day | Status: DC | PRN
  Filled 2014-04-06: qty 30

## 2014-04-06 MED ORDER — NEPRO/CARBSTEADY PO LIQD: 237.0000 mL | ORAL | Status: DC | PRN

## 2014-04-06 MED ORDER — IPRATROPIUM-ALBUTEROL 0.5-2.5 (3) MG/3ML IN SOLN: 3.0000 mL | RESPIRATORY_TRACT | Status: DC

## 2014-04-06 MED ORDER — LABETALOL HCL 200 MG PO TABS
400.0000 mg | ORAL_TABLET | Freq: Two times a day (BID) | ORAL | Status: DC
  Administered 2014-04-06 – 2014-04-07 (×2): 400 mg via ORAL
  Filled 2014-04-06 (×3): qty 2

## 2014-04-06 MED ORDER — CLONIDINE HCL 0.2 MG PO TABS
0.2000 mg | ORAL_TABLET | Freq: Two times a day (BID) | ORAL | Status: DC
  Administered 2014-04-07: 0.2 mg via ORAL
  Filled 2014-04-06 (×3): qty 1

## 2014-04-06 MED ORDER — LIDOCAINE HCL (PF) 1 % IJ SOLN: 5.0000 mL | INTRAMUSCULAR | Status: DC | PRN

## 2014-04-06 MED ORDER — ALTEPLASE 2 MG IJ SOLR
2.0000 mg | Freq: Once | INTRAMUSCULAR | Status: AC | PRN
  Filled 2014-04-06: qty 2

## 2014-04-06 MED ORDER — ISOSORBIDE DINITRATE 10 MG PO TABS
10.0000 mg | ORAL_TABLET | Freq: Two times a day (BID) | ORAL | Status: DC
  Administered 2014-04-06 – 2014-04-07 (×2): 10 mg via ORAL
  Filled 2014-04-06 (×3): qty 1

## 2014-04-06 MED ORDER — HEPARIN SODIUM (PORCINE) 1000 UNIT/ML DIALYSIS: 1000.0000 [IU] | INTRAMUSCULAR | Status: DC | PRN

## 2014-04-06 MED ORDER — LISINOPRIL 20 MG PO TABS
20.0000 mg | ORAL_TABLET | Freq: Two times a day (BID) | ORAL | Status: DC
  Administered 2014-04-06 – 2014-04-07 (×2): 20 mg via ORAL
  Filled 2014-04-06 (×3): qty 1

## 2014-04-06 MED ORDER — DOXAZOSIN MESYLATE 4 MG PO TABS
4.0000 mg | ORAL_TABLET | Freq: Every day | ORAL | Status: DC
  Administered 2014-04-06: 4 mg via ORAL
  Filled 2014-04-06 (×2): qty 1

## 2014-04-06 MED ORDER — AMLODIPINE BESYLATE 10 MG PO TABS
10.0000 mg | ORAL_TABLET | Freq: Every day | ORAL | Status: DC
  Filled 2014-04-06 (×2): qty 1

## 2014-04-06 NOTE — ED Provider Notes (Signed)
CSN: [id number]     Arrival date & time 04/06/14  4098 History   First MD Initiated Contact with Patient 04/06/14 [phone]     Chief Complaint  Patient presents with  . Respiratory Distress     (Consider location/radiation/quality/duration/timing/severity/associated sxs/prior Treatment) HPI The patient has known chronic renal failure. She is due for dialysis today. The patient denies that she has missed any dialysis. She arrives on BiPAP. She reports that she became very short of breath today. She denies any chest pain or other associated pain. EMS report that their initial systolic blood pressure was 280. Past Medical History  Diagnosis Date  . Diabetes mellitus   . Hyperlipidemia   . Hypertension   . Gout due to renal impairment   . Stroke 1999    TIA  PER PATIENT  . ESRD on dialysis 06/12/2011    Gets dialysis [place], [place], TTS schedule with Dr Louis Meckel, started dialysis in 2010.      Past Surgical History  Procedure Laterality Date  . Carotid endarterectomy  2002    left  . Btl    . Rectal fissurectomy    . Av fistula placement  10/30/2010    right brachiocephalic   . Fistulogram     Family History  Problem Relation Age of Onset  . COPD Mother   . Kidney disease Mother   . Heart disease Father   . Lymphoma Son    History  Substance Use Topics  . Smoking status: Current Every Day Smoker -- 1.00 packs/day for 50 years    Types: Cigarettes  . Smokeless tobacco: Never Used  . Alcohol Use: No   OB History    No data available     Review of Systems Patient cannot provide complete review of systems due to acute distress.   Allergies  Codeine; Epinephrine; Percocet; Statins; and Ferrlecit  Home Medications   Prior to Admission medications   Medication Sig Start Date End Date Taking? Authorizing Provider  amLODipine (NORVASC) 10 MG tablet Take 1 tablet (10 mg total) by mouth at bedtime. 09/16/12   Calvert Cantor, MD  calcium carbonate (TUMS - DOSED IN MG ELEMENTAL  CALCIUM) 500 MG chewable tablet Chew 1 tablet by mouth 3 (three) times daily before meals.    Historical Provider, MD  cloNIDine (CATAPRES) 0.2 MG tablet Take 0.2 mg by mouth 2 (two) times daily.    Historical Provider, MD  Coenzyme Q10 300 MG CAPS Take 300 mg by mouth daily.    Historical Provider, MD  docusate sodium (COLACE) 100 MG capsule Take 200 mg by mouth at bedtime.      Historical Provider, MD  doxazosin (CARDURA) 4 MG tablet Take 4 mg by mouth daily.    Historical Provider, MD  hydrALAZINE (APRESOLINE) 100 MG tablet Take 100 mg by mouth 3 (three) times daily.    Historical Provider, MD  labetalol (NORMODYNE) 100 MG tablet Take 100 mg by mouth 2 (two) times daily.    Historical Provider, MD  lisinopril (PRINIVIL,ZESTRIL) 20 MG tablet Take 20 mg by mouth 2 (two) times daily.    Historical Provider, MD  OVER THE COUNTER MEDICATION Take 1 tablet by mouth daily. Renal multivitamin    Historical Provider, MD   BP 163/47 mmHg  Pulse 80  Temp(Src) 98.3 F (36.8 C) (Oral)  Resp 18  Ht  (1.727 m)  Wt 121 lb 7.6 oz (55.1 kg)  BMI 18.47 kg/m2  SpO2 95%  LMP 09/27/2011 Physical Exam  Constitutional:  The patient arrives with the BiPAP in moderate/severe respiratory distress. Her skin is warm and dry. The patient can respond to questions with 1 or 2 word answers.  HENT:  Head: Normocephalic and atraumatic.  Eyes: EOM are normal.  The patient has bilateral puffy periorbital edema.  Neck:  Positive JVD.  Cardiovascular: Normal rate, regular rhythm and intact distal pulses.   A lot of respiratory noise present cannot appreciate any rub murmur gallop at this time.  Pulmonary/Chest: She is in respiratory distress. She has wheezes. She has rales.  Abdominal: Soft. She exhibits no distension. There is no tenderness.  Musculoskeletal: She exhibits edema (patient has approximately 2+ pitting edema bilateral feet.).  Neurological:  The patient appears fatigued however she does awaken  appropriately and assists in following commands. She does make appropriate responses to simple questions. The neurologic examination is nonfocal with purposeful movement of all 4 extremities.  Skin: Skin is warm and dry.  Psychiatric:  Patient is mildly anxious but cooperative.    ED Course  Procedures (including critical care time) Labs Review Labs Reviewed  CBC - Abnormal; Notable for the following:    WBC 11.0 (*)    Hemoglobin 11.6 (*)    HCT 35.9 (*)    RDW 16.3 (*)    All other components within normal limits  BASIC METABOLIC PANEL - Abnormal; Notable for the following:    Glucose, Bld 147 (*)    BUN 36 (*)    Creatinine, Ser 7.69 (*)    GFR calc non Af Amer 5 (*)    GFR calc Af Amer 5 (*)    All other components within normal limits  BASIC METABOLIC PANEL - Abnormal; Notable for the following:    BUN 26 (*)    Creatinine, Ser 4.92 (*)    GFR calc non Af Amer 8 (*)    GFR calc Af Amer 9 (*)    All other components within normal limits  I-STAT TROPOININ, ED - Abnormal; Notable for the following:    Troponin i, poc 0.12 (*)    All other components within normal limits  I-STAT CHEM 8, ED - Abnormal; Notable for the following:    BUN 38 (*)    Creatinine, Ser 7.20 (*)    Glucose, Bld 146 (*)    All other components within normal limits  MRSA PCR SCREENING  PROTIME-INR  HEPATITIS B SURFACE ANTIGEN  I-STAT TROPOININ, ED    Imaging Review Dg Chest Portable 1 View  04/06/2014   CLINICAL DATA:  Initial encounter for respiratory distress  EXAM: PORTABLE CHEST - 1 VIEW  COMPARISON:  09/14/2012, 11/21/2011, 01/16/2010  FINDINGS: There is a left jugular central line with tip in the low SVC. There is moderate vascular fullness and mild diffuse interstitial thickening suggesting volume overload. There are tiny pleural effusions bilaterally. There is no focal airspace consolidation. There is a mass in the left thoracic inlet with deformity and contralateral displacement of the  tracheal air column. This probably represents a goiter. It has been present without significant change since at least 01/16/2010. Hilar and mediastinal contours are otherwise unremarkable. Heart size is upper normal.  IMPRESSION: Vascular and interstitial changes consistent with volume overload. No frank alveolar edema. Tiny pleural effusions.  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   Electronically Signed   By: Ellery Plunk M.D.   On: 04/06/2014 10:15     EKG Interpretation   Date/Time:  Saturday April 06 2014 10:17:42 EST Ventricular Rate:  82 PR Interval:  164 QRS Duration: 89 QT Interval:  441 QTC Calculation: 515 R Axis:   66 Text Interpretation:  Sinus rhythm Right atrial enlargement LVH with  secondary repolarization abnormality Prolonged QT interval peaked T waves.  no significant change from prior. Confirmed by Donnald GarrePfeiffer, MD, Lebron ConnersMarcy [phone]([id number])  on 04/06/2014 11:49:30 AM     Consult 10:25 The patient's case is reviewed with nephrology, she will be assessed in the emergency department.  CRITICAL CARE Performed by: Arby BarrettePfeiffer, Sylvanus Telford   Total critical care time: 60  Critical care time was exclusive of separately billable procedures and treating other patients.  Critical care was necessary to treat or prevent imminent or life-threatening deterioration.  Critical care was time spent personally by me on the following activities: development of treatment plan with patient and/or surrogate as well as nursing, discussions with consultants, evaluation of patient's response to treatment, examination of patient, obtaining history from patient or surrogate, ordering and performing treatments and interventions, ordering and review of laboratory studies, ordering and review of radiographic studies, pulse oximetry and re-evaluation of patient's condition. MDM   Final diagnoses:  Acute pulmonary edema  Chronic renal failure, stage 5  Hypertensive  emergency   The patient arrived in respiratory distress with BiPAP and severe hypertension. Findings and history were consistent for congestive heart failure with known renal failure. BiPAP was continued and the patient was started on an albuterol DuoNeb as well. Hydralazine 20 mg IV was given. There was very limited response to hydralazine thus at that point a Cardene drip was initiated for hypertensive emergency. The patient responded well to the above measures. She was rechecked on multiple occasions and noted to be showing improvement. After nephrology had evaluated the patient and made a determination for admission to the hospitalist, he suggested discontinuing the Cardene drip and transitioning to prn blood pressure control with planned hemodialysis today.     Arby BarretteMarcy Niki Payment, MD 04/07/14 [phone]

## 2014-04-06 NOTE — Progress Notes (Signed)
Pt from ED, RT and primary RN transported. Pt alert, hypertensive, no c/o. BFR 350 due to pt request that it not be turned higher.

## 2014-04-06 NOTE — ED Notes (Signed)
Nephrology at the bedside.

## 2014-04-06 NOTE — Procedures (Signed)
I was present at this dialysis session, have reviewed the session itself and made  appropriate changes  Vinson Moselleob Henrick Mcgue MD (pgr) [phone]    [phone]) [phone] 04/06/2014, 2:57 PM

## 2014-04-06 NOTE — ED Notes (Signed)
Cell phone, phone charger, keys, 2 earrings, 1 bracelet, eyeglasses placed in belongings bag at bedside, along with house coat and blanket

## 2014-04-06 NOTE — Consult Note (Signed)
Renal Service Consult Note [facility]  Alexandra [name] 04/06/2014 Alexandra [name] Requesting Physician:  Dr Donnald Garre, ER MD  Reason for Consult:  ESRD patient w resp distress HPI: The patient is a 69 y.o. year-old with hx ESRD, HTN, DM, gout and TIA presented to ED today with SOB and resp distress onset this am.  No fever, prod cough, CP, no abd pain , n/v/[name].  Gets HD in [facility], today is HD day.  Pt says dry wt was increased recently by about 2 lbs.   Chart Review 6/11 - acute on CKD, started on hemodialysis, HTN, DM2, anemia, MBD 12/12 - presented to hosp for HD, had been "discharged" from her OP HD unit due to "conflict".  Seen by Dr Bascom Levels, dialyzed and dc'[name] home to return to hosp for HD on Tuesday 12/12 > 9/13 -- multiple hosp admissions for dialysis as patient did not have outside HD unit 7/14 - SOB and pulm edema, ^BP > had acute HD w vol removal and issues resolved. Resume OP HD TTS  Past Medical History  Past Medical History  Diagnosis Date  . Diabetes mellitus   . Hyperlipidemia   . Hypertension   . Gout due to renal impairment   . Stroke 1999    TIA  PER PATIENT  . ESRD on dialysis 06/12/2011    Gets dialysis [place], [place], TTS schedule with Dr Louis Meckel, started dialysis in 2010.      Past Surgical History  Past Surgical History  Procedure Laterality Date  . Carotid endarterectomy  2002    left  . Btl    . Rectal fissurectomy    . Av fistula placement  10/30/2010    right brachiocephalic   . Fistulogram     Family History  Family History  Problem Relation Age of Onset  . COPD Mother   . Kidney disease Mother   . Heart disease Father   . Lymphoma Son    Social History  reports that she has been smoking Cigarettes.  She has a 50 pack-year smoking history. She has never used smokeless tobacco. She reports that she does not drink alcohol or use illicit drugs. Allergies  Allergies  Allergen Reactions  . Codeine Other (See Comments)     Passes out  . Epinephrine Other (See Comments)    Tachycardia, diaphoresis, syncope  . Percocet [Oxycodone-Acetaminophen] Other (See Comments)    Passes  out  . Statins Other (See Comments)    Liver enzymes rise   PATIENT DOES NOT WANT TO BE GIVEN THIS TYPE OF MEDICATION PER THE ADVISE OF [facility] PHYSICI[facility]   Home medications Prior to Admission medications   Medication Sig Start Date End Date Taking? Authorizing Provider  amLODipine (NORVASC) 10 MG tablet Take 1 tablet (10 mg total) by mouth at bedtime. 09/16/12   Calvert Cantor, MD  amLODipine (NORVASC) 10 MG tablet TAKE 1 TABLET BY MOUTH EVERY NIGHT AT BEDTIME 09/16/12   Dorothea Ogle, MD  calcium carbonate (TUMS - DOSED IN MG ELEMENTAL CALCIUM) 500 MG chewable tablet Chew 1 tablet by mouth 3 (three) times daily before meals.    Historical Provider, MD  cloNIDine (CATAPRES) 0.2 MG tablet Take 0.2 mg by mouth 2 (two) times daily.    Historical Provider, MD  Coenzyme Q10 300 MG CAPS Take 300 mg by mouth daily.    Historical Provider, MD  docusate sodium (COLACE) 100 MG capsule Take 200 mg by mouth at bedtime.  Historical Provider, MD  doxazosin (CARDURA) 4 MG tablet Take 4 mg by mouth daily.    Historical Provider, MD  hydrALAZINE (APRESOLINE) 100 MG tablet Take 100 mg by mouth 3 (three) times daily.    Historical Provider, MD  labetalol (NORMODYNE) 100 MG tablet Take 100 mg by mouth 2 (two) times daily.    Historical Provider, MD  labetalol (NORMODYNE) 300 MG tablet Take 300 mg by mouth 2 (two) times daily.    Historical Provider, MD  lisinopril (PRINIVIL,ZESTRIL) 20 MG tablet Take 20 mg by mouth 2 (two) times daily.    Historical Provider, MD  OVER THE COUNTER MEDICATION Take 1 tablet by mouth daily. Renal multivitamin    Historical Provider, MD   Liver Function Tests No results for input(s): AST, ALT, ALKPHOS, BILITOT, PROT, ALBUMIN in the last 168 hours. No results for input(s): LIPASE, AMYLASE in the last 168  hours. CBC  Recent Labs Lab 04/06/14 1011 04/06/14 1020  WBC 11.0*  --   HGB 11.6* 13.9  HCT 35.9* 41.0  MCV 89.1  --   PLT 186  --    Basic Metabolic Panel  Recent Labs Lab 04/06/14 1020  NA 138  K 4.1  CL 103  GLUCOSE 146*  BUN 38*  CREATININE 7.20*    Filed Vitals:   04/06/14 1000 04/06/14 1014 04/06/14 1015 04/06/14 1030  BP: 229/89  265/94 244/79  Pulse: 91  81 87  Temp:  92.5 F (33.6 C)    TempSrc:  Axillary    Resp:   25 28  Height:      Weight:      SpO2: 100%  100% 100%   Exam On bipap, responsive, RR 20 No rash, cyanosis or gangrene Sclera anicteric, throat clear No jvd Chest clear bilat RRR no MRG Abd soft, NTND, +BS Moderate bilat pitting pretib edema Neuro has paretic RUE from steal, Ox 3 L neck IJ TDC  HD: TTS High 3.5h   121 lb    300/800   2/2.0 [place]   IJ Cath (last AVF clotted, awaiting new access)    Heparin none EPO 4000,   Hectorol   3ug   Assessment: 1. Resp distress/ dyspnea / pulm edema - due to vol overload 2. [facility] 3. DM 4. HTN'sive crisis - takes norvasc, clonidine, Cardura, hydralazine, labetalol, lisinopril (6 BP meds) per home med list 5. Anemia Hb 13, hold esa   Plan- HD asap upstairs , max UF as tolerated  Vinson Moselleob Valton Schwartz MD (pgr) [phone]    [phone]) [phone] 04/06/2014, 10:46 AM

## 2014-04-06 NOTE — ED Notes (Signed)
Belonging bag transported to dialysis with pt.

## 2014-04-06 NOTE — ED Notes (Signed)
Dr Donnald Garrepfeiffer aware of temperature.

## 2014-04-06 NOTE — ED Notes (Signed)
Results of troponin given to Dr. Pfeiffer 

## 2014-04-06 NOTE — Progress Notes (Signed)
Pt received HD tx. Ended 15 mins early due to extreme cramps in both legs. 3.5 L fluid total removed. Report called to Rachael Darbyroyce Hood, RN 2C.

## 2014-04-06 NOTE — ED Notes (Signed)
Pt here for respiratory distress, on cpap on arrival. Pt dialysis and due today, after cpap sats 94, initially ws 70's, initial cp was 280 palpated, after 2 ntg  bp 220, pt found tripoding and diaphoretic.

## 2014-04-06 NOTE — H&P (Signed)
[facility] History and Physical                                                                                    Patient Demographics  Alexandra [name], is a 69 y.o. female  MRN: [id number]   DOB - June 15, 1945  Admit Date - 04/06/2014  Outpatient Primary MD for the patient is PROVIDER NOT IN SYSTEM   With History of -  Past Medical History  Diagnosis Date  . Diabetes mellitus   . Hyperlipidemia   . Hypertension   . Gout due to renal impairment   . Stroke 1999    TIA  PER PATIENT  . ESRD on dialysis 06/12/2011    Gets dialysis [place], [place], TTS schedule with Dr Louis Meckel, started dialysis in 2010.         Past Surgical History  Procedure Laterality Date  . Carotid endarterectomy  2002    left  . Btl    . Rectal fissurectomy    . Av fistula placement  10/30/2010    right brachiocephalic   . Fistulogram      in for   Chief Complaint  Patient presents with  . Respiratory Distress     HPI  Alexandra [name]  is a 69 y.o. female, 69 year old female patient with chronic kidney disease on dialysis, hypertension and gout. She has a prior history of diabetes mellitus which is currently diet controlled. Last hemoglobin A1c on record was 5.4. Patient reports onset of shortness of breath beginning 24 hours prior to presentation that has become progressively worse. Reports that it dialysis her dry weight was recently increased by about 2 pounds. Upon presentation to the ER patient in significant respiratory distress with hypoxemia requiring BiPAP. Respiratory rate at presentation was 35 with a blood pressure of 251/94. Because of significant dyspnea patient was unable to speak without significant shortness of breath. A Cardene drip was initiated. Nephrology has already evaluated the patient in the ER and plans to take the patient to emergent hemodialysis. Chest x-ray performed in the ER revealed vascular interstitial changes consistent with volume  overload.  Review of Systems    In addition to the HPI above: No Fever-chills, No Headache, No changes with Vision or hearing, No problems swallowing food or Liquids, No Chest pain, Cough or Shortness of Breath, reports subtle increase in lower extremity edema without pitting No Abdominal pain, No Nausea or Vomiting, Bowel movements are regular, No Blood in stool or Urine, No dysuria, No new skin rashes or bruises, No new joints pains-aches,  No new weakness, tingling, numbness in any extremity, No recent weight loss, suspected weight gain as noted above No polyuria, polydypsia or polyphagia, No significant Mental Stressors.  A full 10 point Review of Systems was done, except as stated above, all other Review of Systems were negative.   Social History History  Substance Use Topics  . Smoking status: Current Every Day Smoker -- 1.00 packs/day for 50 years    Types: Cigarettes  . Smokeless tobacco: Never Used  . Alcohol Use: No     Family History Family History  Problem Relation Age of Onset  .  COPD Mother   . Kidney disease Mother   . Heart disease Father   . Lymphoma Son      Prior to Admission medications   Medication Sig Start Date End Date Taking? Authorizing Provider  amLODipine (NORVASC) 10 MG tablet Take 1 tablet (10 mg total) by mouth at bedtime. 09/16/12   Calvert CantorSaima Rizwan, MD  calcium carbonate (TUMS - DOSED IN MG ELEMENTAL CALCIUM) 500 MG chewable tablet Chew 1 tablet by mouth 3 (three) times daily before meals.    Historical Provider, MD  cloNIDine (CATAPRES) 0.2 MG tablet Take 0.2 mg by mouth 2 (two) times daily.    Historical Provider, MD  Coenzyme Q10 300 MG CAPS Take 300 mg by mouth daily.    Historical Provider, MD  docusate sodium (COLACE) 100 MG capsule Take 200 mg by mouth at bedtime.      Historical Provider, MD  doxazosin (CARDURA) 4 MG tablet Take 4 mg by mouth daily.    Historical Provider, MD  hydrALAZINE (APRESOLINE) 100 MG tablet Take 100 mg by  mouth 3 (three) times daily.    Historical Provider, MD  labetalol (NORMODYNE) 100 MG tablet Take 100 mg by mouth 2 (two) times daily.    Historical Provider, MD  lisinopril (PRINIVIL,ZESTRIL) 20 MG tablet Take 20 mg by mouth 2 (two) times daily.    Historical Provider, MD  OVER THE COUNTER MEDICATION Take 1 tablet by mouth daily. Renal multivitamin    Historical Provider, MD    Allergies  Allergen Reactions  . Codeine Other (See Comments)    Passes out  . Epinephrine Other (See Comments)    Tachycardia, diaphoresis, syncope  . Percocet [Oxycodone-Acetaminophen] Other (See Comments)    Passes  out  . Statins Other (See Comments)    Liver enzymes rise   PATIENT DOES NOT WANT TO BE GIVEN THIS TYPE OF MEDICATION PER THE [facility] OF [facility]    Physical Exam  Vitals  Blood pressure 173/73, pulse 85, temperature 97.8 F (36.6 C), temperature source Oral, resp. rate 23, height 5\' 8"  (1.727 m), weight 120 lb (54.432 kg), last menstrual period 09/27/2011, SpO2 95 %.   General:  lying in bed in NAD, alert and now able to verbally communicate  Psych:  Normal affect and insight, Not Suicidal or Homicidal, Awake Alert, Oriented X 3.  Neuro:   No F.N deficits, ALL C.Nerves Intact, Strength 5/5 all 4 extremities, Sensation intact all 4 extremities.  ENT:  Ears appear Normal, Conjunctivae clear, PERRLA. Lateral scleral edema Moist Oral Mucosa.  Neck:  Supple Neck, No JVD, No cervical lymphadenopathy appreciated  Respiratory:  Symmetrical Chest wall movement, Good air movement bilaterally, bibasilar crackles, respiratory effort nonlabored and no further tachypnea  Cardiac:  RRR, No Gallops, Rubs or Murmurs, No Parasternal Heave.  Abdomen:  Positive Bowel Sounds, Abdomen Soft, Non tender, No organomegaly appriciated  Skin:  No Cyanosis, Normal Skin Turgor, No Skin Rash or Bruise.  Extremities:  Good muscle tone,  joints appear normal , no effusions, Normal  ROM.   Data Review  CBC  Recent Labs Lab 04/06/14 1011 04/06/14 1020  WBC 11.0*  --   HGB 11.6* 13.9  HCT 35.9* 41.0  PLT 186  --   MCV 89.1  --   MCH 28.8  --   MCHC 32.3  --   RDW 16.3*  --    ------------------------------------------------------------------------------------------------------------------  Chemistries   Recent Labs Lab 04/06/14 1011 04/06/14 1020  NA 138 138  K 4.2 4.1  CL 101 103  CO2 25  --   GLUCOSE 147* 146*  BUN 36* 38*  CREATININE 7.69* 7.20*  CALCIUM 9.5  --    ------------------------------------------------------------------------------------------------------------------ estimated creatinine clearance is 6.2 mL/min (by C-G formula based on Cr of 7.2). ------------------------------------------------------------------------------------------------------------------ No results for input(s): TSH, T4TOTAL, T3FREE, THYROIDAB in the last 72 hours.  Invalid input(s): FREET3   Coagulation profile  Recent Labs Lab 04/06/14 1011  INR 1.10   ------------------------------------------------------------------------------------------------------------------- No results for input(s): DDIMER in the last 72 hours. -------------------------------------------------------------------------------------------------------------------  Cardiac Enzymes No results for input(s): CKMB, TROPONINI, MYOGLOBIN in the last 168 hours.  Invalid input(s): CK ------------------------------------------------------------------------------------------------------------------ Invalid input(s): POCBNP   ---------------------------------------------------------------------------------------------------------------  Urinalysis    Component Value Date/Time   COLORURINE YELLOW 08/20/2009 2114   APPEARANCEUR HAZY* 08/20/2009 2114   LABSPEC >1.030* 08/20/2009 2114   PHURINE 6.0 08/20/2009 2114   GLUCOSEU NEGATIVE 08/20/2009 2114   HGBUR SMALL* 08/20/2009 2114    BILIRUBINUR NEGATIVE 08/20/2009 2114   KETONESUR NEGATIVE 08/20/2009 2114   PROTEINUR >300* 08/20/2009 2114   UROBILINOGEN 0.2 08/20/2009 2114   NITRITE NEGATIVE 08/20/2009 2114   LEUKOCYTESUR NEGATIVE 08/20/2009 2114    ----------------------------------------------------------------------------------------------------------------  Imaging results:   Dg Chest Portable 1 View  04/06/2014   CLINICAL DATA:  Initial encounter for respiratory distress  EXAM: PORTABLE CHEST - 1 VIEW  COMPARISON:  09/14/2012, 11/21/2011, 01/16/2010  FINDINGS: There is a left jugular central line with tip in the low SVC. There is moderate vascular fullness and mild diffuse interstitial thickening suggesting volume overload. There are tiny pleural effusions bilaterally. There is no focal airspace consolidation. There is a mass in the left thoracic inlet with deformity and contralateral displacement of the tracheal air column. This probably represents a goiter. It has been present without significant change since at least 01/16/2010. Hilar and mediastinal contours are otherwise unremarkable. Heart size is upper normal.  IMPRESSION: Vascular and interstitial changes consistent with volume overload. No frank alveolar edema. Tiny pleural effusions.  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   Electronically Signed   By: Ellery Plunk M.D.   On: 04/06/2014 10:15    My personal review of EKG: Rhythm NSR, Rate  82 /min, QTc 515 , no Acute ST changes, LVH, RAE, prolonged Qtc    Assessment & Plan  Active Problems:   Acute respiratory failure with hypoxia/Acute pulmonary edema -Symptoms markedly improved after treatment for marked hypertension and use of BiPAP -after arrival to hemodialysis patient weaned to nasal cannula    Hypertensive crisis -Patient denies dietary indiscretion or missing medications -Nicardipine infusion has been discontinued and current blood pressure is  178/66 -Resume patient's home medications of: Norvasc, Catapres, Cardura, hydralazine, Zestril and labetalol -Suspect blood pressure will further improve after hemodialysis complete    ESRD on dialysis -Per nephrology -Outpatient nephrologist in [facility] is Dr. Louis Meckel    Diabetes mellitus -Currently not on medications and appears diet-controlled -Review of hemoglobin A1c trends over several years: average between 4 and 5.6    Acute on chronic diastolic CHF (congestive heart failure), NYHA class 1 -Contributed to acute hypoxemic respiratory failure -Suspect will compensate after dialysis and improved blood pressure control       DVT Prophylaxis: SCDs   Family Communication:   No family at bedside   Code Status:  Full   Likely DC to:  home within next 24 hours  Condition:  Stable  Time spent in minutes : 60  ELLIS,ALLISON L. ANPon 04/06/2014 at 1:03 PM  Between 7am to 7pm - Pager: [phone]  After 7pm go to [email or web address] - password [email or web address]  And look for the night coverage person covering me after hours  [facility] Office  [phone]  I have directly reviewed the clinical findings, lab results and imaging studies. I have interviewed and examined the patient and agree with the documentation and management as recorded by the Physician extender.  Patient seen in dialysis unit. Currently on nasal cannula, not in acute distress, reported wanting to eat. permcath accessed for dialysis. Lung diminished, heart RRR, bilateral pitting edema in lower extremity.  Gwenivere Hiraldo M.D on 04/06/2014 at 2:06 PM  [facility] Office  [phone]

## 2014-04-07 ENCOUNTER — Encounter (HOSPITAL_COMMUNITY): Payer: Self-pay | Admitting: *Deleted

## 2014-04-07 DIAGNOSIS — I10 Essential (primary) hypertension: Secondary | ICD-10-CM

## 2014-04-07 DIAGNOSIS — J9601 Acute respiratory failure with hypoxia: Secondary | ICD-10-CM

## 2014-04-07 DIAGNOSIS — N185 Chronic kidney disease, stage 5: Secondary | ICD-10-CM

## 2014-04-07 DIAGNOSIS — J810 Acute pulmonary edema: Secondary | ICD-10-CM

## 2014-04-07 LAB — BASIC METABOLIC PANEL
Anion gap: 9 (ref 5–15)
BUN: 26 mg/dL — ABNORMAL HIGH (ref 6–23)
CO2: 28 mmol/L (ref 19–32)
Calcium: 9 mg/dL (ref 8.4–10.5)
Chloride: 101 mmol/L (ref 96–112)
Creatinine, Ser: 4.92 mg/dL — ABNORMAL HIGH (ref 0.50–1.10)
GFR calc non Af Amer: 8 mL/min — ABNORMAL LOW (ref >90)
GFR, EST AFRICAN AMERICAN: 9 mL/min — AB (ref >90)
GLUCOSE: 83 mg/dL (ref 70–99)
Potassium: 3.5 mmol/L (ref 3.5–5.1)
Sodium: 138 mmol/L (ref 135–145)

## 2014-04-07 MED ORDER — ISOSORBIDE DINITRATE 10 MG PO TABS: 10.0000 mg | ORAL_TABLET | Freq: Two times a day (BID) | ORAL | Status: AC

## 2014-04-07 MED ORDER — BENZONATATE 100 MG PO CAPS
100.0000 mg | ORAL_CAPSULE | Freq: Three times a day (TID) | ORAL | Status: DC | PRN
  Administered 2014-04-07: 100 mg via ORAL
  Filled 2014-04-07: qty 1

## 2014-04-07 MED ORDER — BENZONATATE 100 MG PO CAPS: 100.0000 mg | ORAL_CAPSULE | Freq: Three times a day (TID) | ORAL | Status: AC | PRN

## 2014-04-07 NOTE — Progress Notes (Signed)
Pt insisting on having an air mattress. Md and charge nurse notified. Air mattress ordered and delivered to patient's room. Pt refusing bed alarm and SCD's. Pt educated on both. RN will continue to monitor.

## 2014-04-07 NOTE — Discharge Summary (Signed)
Physician Discharge Summary  Alexandra [name] ZOX:[id number] DOB: 07/11/1945 DOA: 04/06/2014  PCP: PROVIDER NOT IN SYSTEM  Admit date: 04/06/2014 Discharge date: 04/07/2014  Time spent: 40 minutes  Recommendations for Outpatient Follow-up:  Continue HD as scheduled.  Reconsider dry weight.  Pt admitted with pulmonary edema. Please monitor BP.  Pt's BP elevated even after HD.  Discharge Diagnoses:  Principal Problem:   HTN (hypertension), malignant Active Problems:   ESRD on dialysis   Diabetes mellitus   Acute respiratory failure with hypoxia   Acute on chronic diastolic CHF (congestive heart failure), NYHA class 1   Acute pulmonary edema   Hypertensive crisis   Pulmonary edema   Discharge Condition: stable.  History of present illness:  Alexandra [name] is a 69 y.o. female, 69 year old female patient with chronic kidney disease on dialysis, hypertension and gout. She has a prior history of diabetes mellitus which is currently diet controlled. Last hemoglobin A1c on record was 5.4. Patient reports onset of shortness of breath beginning 24 hours prior to presentation that has become progressively worse. Reports that it dialysis her dry weight was recently increased by about 2 pounds. Upon presentation to the ER patient in significant respiratory distress with hypoxemia requiring BiPAP. Respiratory rate at presentation was 35 with a blood pressure of 251/94. Because of significant dyspnea patient was unable to speak without significant shortness of breath. A Cardene drip was initiated. Nephrology has already evaluated the patient in the ER and plans to take the patient to emergent hemodialysis. Chest x-ray performed in the ER revealed vascular interstitial changes consistent with volume overload.  Hospital Course:   Acute respiratory failure with hypoxia/Acute pulmonary edema Symptoms markedly improved after treatment for marked hypertension and use of BiPAP Post HD, patient is  breathing normally.  Oxygen saturation is 98% on room air at the time of discharge.   Hypertensive crisis (251/94) Patient denies dietary indiscretion or missing medications Nicardipine infusion started on admission and subsequently discontinued. Resume patient's home medications of: Norvasc, Catapres, Cardura, hydralazine, Zestril and labetalol Patient's BP remained in the 160 - 170s range after HD.  Will request PCP to review / adjust medications as needed.   ESRD on dialysis Per nephrology [facility] in [facility] is Dr. Louis [name]   Diabetes mellitus Currently not on medications and appears diet-controlled Review of hemoglobin A1c trends over several years: average between 4 and 5.6   Acute on chronic diastolic CHF (congestive heart failure), NYHA class 1 Compensated post HD.    Procedures:  HD  Consultations:  Nephrology  Discharge Exam: Filed Vitals:   04/06/14 2139 04/06/14 2338 04/07/14 0438 04/07/14 0900  BP: 245/72 192/50 163/47 172/62  Pulse:   80 77  Temp:   98.3 F (36.8 C) 97.8 F (36.6 C)  TempSrc:   Oral Oral  Resp:    16  Height:      Weight:      SpO2:   95% 98%   Filed Weights   04/06/14 0957 04/06/14 1709  Weight: 54.432 kg (120 lb) 55.1 kg (121 lb 7.6 oz)     General: Well-nourished, elderly female, appears younger than stated age, sitting on the side of the bed eating breakfast. Cardiovascular: Irregular rate and rhythm , no murmurs rubs or gallops Respiratory: Clear to auscultation bilaterally. Patient does not inspire deeply. Abdominal: Soft, nontender, nondistended, positive bowel sounds. Extremities: Without edema. 5 over 5 strength in each. Patient able to ambulate.  Discharge Instructions  Diet recommendation:  Discharge Instructions    Increase activity slowly    Complete by:  As directed      Increase activity slowly    Complete by:  As directed           Current Discharge Medication List    START taking  these medications   Details  benzonatate (TESSALON) 100 MG capsule Take 1 capsule (100 mg total) by mouth 3 (three) times daily as needed for cough. Qty: 20 capsule, Refills: 0    isosorbide dinitrate (ISORDIL) 10 MG tablet Take 1 tablet (10 mg total) by mouth 2 (two) times daily. Qty: 60 tablet, Refills: 0      CONTINUE these medications which have NOT CHANGED   Details  amLODipine (NORVASC) 10 MG tablet Take 1 tablet (10 mg total) by mouth at bedtime. Qty: 30 tablet, Refills: 0    calcium carbonate (TUMS - DOSED IN MG ELEMENTAL CALCIUM) 500 MG chewable tablet Chew 1 tablet by mouth 3 (three) times daily before meals.    cloNIDine (CATAPRES) 0.2 MG tablet Take 0.2 mg by mouth 2 (two) times daily.    Coenzyme Q10 300 MG CAPS Take 300 mg by mouth daily.    docusate sodium (COLACE) 100 MG capsule Take 200 mg by mouth at bedtime.      doxazosin (CARDURA) 4 MG tablet Take 4 mg by mouth daily.    hydrALAZINE (APRESOLINE) 100 MG tablet Take 100 mg by mouth 3 (three) times daily.    labetalol (NORMODYNE) 100 MG tablet Take 100 mg by mouth 2 (two) times daily.    lisinopril (PRINIVIL,ZESTRIL) 20 MG tablet Take 20 mg by mouth 2 (two) times daily.    OVER THE COUNTER MEDICATION Take 1 tablet by mouth daily. Renal multivitamin       Allergies  Allergen Reactions  . Codeine Other (See Comments)    Passes out  . Epinephrine Other (See Comments)    Tachycardia, diaphoresis, syncope  . Percocet [Oxycodone-Acetaminophen] Other (See Comments)    Passes  out  . Statins Other (See Comments)    Liver enzymes rise   PATIENT DOES NOT WANT TO BE GIVEN THIS TYPE OF MEDICATION PER THE [facility] OF [facility]  . Ferrlecit [Na Ferric Gluc Cplx In Sucrose] Diarrhea    Severe diarrhea   Follow-up Information    Please follow up.   Why:  continue Hemo-dialysis as previously scheduled.       The results of significant diagnostics from this hospitalization (including imaging,  microbiology, ancillary and laboratory) are listed below for reference.    Significant Diagnostic Studies: Dg Chest Portable 1 View  04/06/2014   CLINICAL DATA:  Initial encounter for respiratory distress  EXAM: PORTABLE CHEST - 1 VIEW  COMPARISON:  09/14/2012, 11/21/2011, 01/16/2010  FINDINGS: There is a left jugular central line with tip in the low SVC. There is moderate vascular fullness and mild diffuse interstitial thickening suggesting volume overload. There are tiny pleural effusions bilaterally. There is no focal airspace consolidation. There is a mass in the left thoracic inlet with deformity and contralateral displacement of the tracheal air column. This probably represents a goiter. It has been present without significant change since at least 01/16/2010. Hilar and mediastinal contours are otherwise unremarkable. Heart size is upper normal.  IMPRESSION: Vascular and interstitial changes consistent with volume overload. No frank alveolar edema. Tiny pleural effusions.  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   Electronically Signed  By: Ellery Plunk M.D.   On: 04/06/2014 10:15    Microbiology: Recent Results (from the past 240 hour(s))  MRSA PCR Screening     Status: None   Collection Time: 04/06/14  5:23 PM  Result Value Ref Range Status   MRSA by PCR NEGATIVE NEGATIVE Final    Comment:        The GeneXpert MRSA Assay (FDA approved for NASAL specimens only), is one component of a comprehensive MRSA colonization surveillance program. It is not intended to diagnose MRSA infection nor to guide or monitor treatment for MRSA infections.      Labs: Basic Metabolic Panel:  Recent Labs Lab 04/06/14 1011 04/06/14 1020 04/07/14 0525  NA 138 138 138  K 4.2 4.1 3.5  CL 101 103 101  CO2 25  --  28  GLUCOSE 147* 146* 83  BUN 36* 38* 26*  CREATININE 7.69* 7.20* 4.92*  CALCIUM 9.5  --  9.0   CBC:  Recent Labs Lab 04/06/14 1011  04/06/14 1020  WBC 11.0*  --   HGB 11.6* 13.9  HCT 35.9* 41.0  MCV 89.1  --   PLT 186  --       Signed:  Stephani Police, PA-C  [facility] 04/07/2014, 12:55 PM  Addendum  I personally saw and evaluated patient on 04/07/2014 and agree with the above findings. Patient was admitted to the medicine service presenting with complaints of shortness of breath, requiring NPPV. CXR showing findings consistent with pulmonary edema. She has a history of ESRD on renal replacement therapy. Her symptoms significantly improved after undergoing HD during this hospitalization. By 04/07/2014 she was breathing comfortably on room air maintaining her oxygen saturations. She was discharged to her home in stable condition on 04/07/2014.

## 2014-04-07 NOTE — Discharge Instructions (Signed)
Please continue hemodialysis as previously scheduled.    Even after HD your BP is elevated.  Please see your nephrologist or PCP regarding a BP check.    Take care.

## 2014-04-07 NOTE — Progress Notes (Signed)
Utilization review completed.  

## 2014-04-12 ENCOUNTER — Emergency Department (HOSPITAL_COMMUNITY): Payer: Commercial Managed Care - HMO

## 2014-04-12 ENCOUNTER — Inpatient Hospital Stay (HOSPITAL_COMMUNITY)
Admission: EM | Admit: 2014-04-12 | Discharge: 2014-04-14 | DRG: 682 | Disposition: A | Discharge status: Home / Self Care | Payer: Commercial Managed Care - HMO | Attending: Internal Medicine | Admitting: Internal Medicine

## 2014-04-12 ENCOUNTER — Encounter (HOSPITAL_COMMUNITY): Payer: Self-pay

## 2014-04-12 DIAGNOSIS — J810 Acute pulmonary edema: Secondary | ICD-10-CM | POA: Diagnosis present

## 2014-04-12 DIAGNOSIS — J811 Chronic pulmonary edema: Secondary | ICD-10-CM | POA: Diagnosis present

## 2014-04-12 DIAGNOSIS — I120 Hypertensive chronic kidney disease with stage 5 chronic kidney disease or end stage renal disease: Secondary | ICD-10-CM | POA: Diagnosis present

## 2014-04-12 DIAGNOSIS — R778 Other specified abnormalities of plasma proteins: Secondary | ICD-10-CM

## 2014-04-12 DIAGNOSIS — Z8673 Personal history of transient ischemic attack (TIA), and cerebral infarction without residual deficits: Secondary | ICD-10-CM | POA: Diagnosis not present

## 2014-04-12 DIAGNOSIS — N2581 Secondary hyperparathyroidism of renal origin: Secondary | ICD-10-CM | POA: Diagnosis present

## 2014-04-12 DIAGNOSIS — M109 Gout, unspecified: Secondary | ICD-10-CM | POA: Diagnosis present

## 2014-04-12 DIAGNOSIS — Z79899 Other long term (current) drug therapy: Secondary | ICD-10-CM | POA: Diagnosis not present

## 2014-04-12 DIAGNOSIS — I10 Essential (primary) hypertension: Secondary | ICD-10-CM | POA: Diagnosis present

## 2014-04-12 DIAGNOSIS — D631 Anemia in chronic kidney disease: Secondary | ICD-10-CM | POA: Diagnosis present

## 2014-04-12 DIAGNOSIS — I169 Hypertensive crisis, unspecified: Secondary | ICD-10-CM

## 2014-04-12 DIAGNOSIS — Z992 Dependence on renal dialysis: Secondary | ICD-10-CM

## 2014-04-12 DIAGNOSIS — Z9119 Patient's noncompliance with other medical treatment and regimen: Secondary | ICD-10-CM | POA: Diagnosis present

## 2014-04-12 DIAGNOSIS — Z888 Allergy status to other drugs, medicaments and biological substances status: Secondary | ICD-10-CM | POA: Diagnosis not present

## 2014-04-12 DIAGNOSIS — Z9114 Patient's other noncompliance with medication regimen: Secondary | ICD-10-CM | POA: Diagnosis present

## 2014-04-12 DIAGNOSIS — J9601 Acute respiratory failure with hypoxia: Secondary | ICD-10-CM | POA: Diagnosis present

## 2014-04-12 DIAGNOSIS — E119 Type 2 diabetes mellitus without complications: Secondary | ICD-10-CM | POA: Diagnosis present

## 2014-04-12 DIAGNOSIS — I5033 Acute on chronic diastolic (congestive) heart failure: Secondary | ICD-10-CM | POA: Diagnosis present

## 2014-04-12 DIAGNOSIS — Z885 Allergy status to narcotic agent status: Secondary | ICD-10-CM

## 2014-04-12 DIAGNOSIS — J9600 Acute respiratory failure, unspecified whether with hypoxia or hypercapnia: Secondary | ICD-10-CM | POA: Diagnosis present

## 2014-04-12 DIAGNOSIS — Z87891 Personal history of nicotine dependence: Secondary | ICD-10-CM

## 2014-04-12 DIAGNOSIS — E785 Hyperlipidemia, unspecified: Secondary | ICD-10-CM | POA: Diagnosis present

## 2014-04-12 DIAGNOSIS — N186 End stage renal disease: Secondary | ICD-10-CM | POA: Diagnosis present

## 2014-04-12 DIAGNOSIS — E213 Hyperparathyroidism, unspecified: Secondary | ICD-10-CM | POA: Diagnosis present

## 2014-04-12 DIAGNOSIS — R7989 Other specified abnormal findings of blood chemistry: Secondary | ICD-10-CM

## 2014-04-12 DIAGNOSIS — D6489 Other specified anemias: Secondary | ICD-10-CM | POA: Diagnosis present

## 2014-04-12 LAB — CBC WITH DIFFERENTIAL/PLATELET
Basophils Absolute: 0.1 10*3/uL (ref 0.0–0.1)
Basophils Relative: 1 % (ref 0–1)
EOS ABS: 0.3 10*3/uL (ref 0.0–0.7)
Eosinophils Relative: 3 % (ref 0–5)
HEMATOCRIT: 34 % — AB (ref 36.0–46.0)
Hemoglobin: 10.7 g/dL — ABNORMAL LOW (ref 12.0–15.0)
Lymphocytes Relative: 16 % (ref 12–46)
Lymphs Abs: 1.5 10*3/uL (ref 0.7–4.0)
MCH: 28 pg (ref 26.0–34.0)
MCHC: 31.5 g/dL (ref 30.0–36.0)
MCV: 89 fL (ref 78.0–100.0)
MONOS PCT: 8 % (ref 3–12)
Monocytes Absolute: 0.7 10*3/uL (ref 0.1–1.0)
NEUTROS PCT: 72 % (ref 43–77)
Neutro Abs: 6.6 10*3/uL (ref 1.7–7.7)
Platelets: 185 10*3/uL (ref 150–400)
RBC: 3.82 MIL/uL — AB (ref 3.87–5.11)
RDW: 16.3 % — ABNORMAL HIGH (ref 11.5–15.5)
WBC: 9.1 10*3/uL (ref 4.0–10.5)

## 2014-04-12 LAB — COMPREHENSIVE METABOLIC PANEL
ALBUMIN: 3.5 g/dL (ref 3.5–5.2)
ALT: 15 U/L (ref 0–35)
AST: 26 U/L (ref 0–37)
Alkaline Phosphatase: 95 U/L (ref 39–117)
Anion gap: 9 (ref 5–15)
BILIRUBIN TOTAL: 0.6 mg/dL (ref 0.3–1.2)
BUN: 17 mg/dL (ref 6–23)
CHLORIDE: 100 mmol/L (ref 96–112)
CO2: 31 mmol/L (ref 19–32)
Calcium: 9.5 mg/dL (ref 8.4–10.5)
Creatinine, Ser: 5.05 mg/dL — ABNORMAL HIGH (ref 0.50–1.10)
GFR calc Af Amer: 9 mL/min — ABNORMAL LOW (ref >90)
GFR, EST NON AFRICAN AMERICAN: 8 mL/min — AB (ref >90)
Glucose, Bld: 160 mg/dL — ABNORMAL HIGH (ref 70–99)
Potassium: 3.6 mmol/L (ref 3.5–5.1)
Sodium: 140 mmol/L (ref 135–145)
Total Protein: 7.5 g/dL (ref 6.0–8.3)

## 2014-04-12 LAB — BRAIN NATRIURETIC PEPTIDE: B Natriuretic Peptide: >4500 pg/mL — ABNORMAL HIGH (ref 0.0–100.0)

## 2014-04-12 LAB — TROPONIN I: TROPONIN I: 0.08 ng/mL — AB (ref <0.031)

## 2014-04-12 MED ORDER — HEPARIN SODIUM (PORCINE) 5000 UNIT/ML IJ SOLN
5000.0000 [IU] | Freq: Three times a day (TID) | INTRAMUSCULAR | Status: DC
  Filled 2014-04-12 (×7): qty 1

## 2014-04-12 MED ORDER — SODIUM CHLORIDE 0.9 % IJ SOLN: 3.0000 mL | Freq: Two times a day (BID) | INTRAMUSCULAR | Status: DC

## 2014-04-12 MED ORDER — AMLODIPINE BESYLATE 10 MG PO TABS
10.0000 mg | ORAL_TABLET | Freq: Every day | ORAL | Status: DC
  Administered 2014-04-12 – 2014-04-13 (×2): 10 mg via ORAL
  Filled 2014-04-12 (×3): qty 1

## 2014-04-12 MED ORDER — HEPARIN SODIUM (PORCINE) 1000 UNIT/ML DIALYSIS
100.0000 [IU]/kg | INTRAMUSCULAR | Status: DC | PRN
  Filled 2014-04-12: qty 6

## 2014-04-12 MED ORDER — SODIUM CHLORIDE 0.9 % IV SOLN: 100.0000 mL | INTRAVENOUS | Status: DC | PRN

## 2014-04-12 MED ORDER — ACETAMINOPHEN 325 MG PO TABS: 650.0000 mg | ORAL_TABLET | Freq: Four times a day (QID) | ORAL | Status: DC | PRN

## 2014-04-12 MED ORDER — COENZYME Q10 300 MG PO CAPS: 300.0000 mg | ORAL_CAPSULE | Freq: Every day | ORAL | Status: DC

## 2014-04-12 MED ORDER — CALCIUM CARBONATE ANTACID 500 MG PO CHEW
1.0000 | CHEWABLE_TABLET | Freq: Three times a day (TID) | ORAL | Status: DC
  Administered 2014-04-12 – 2014-04-14 (×4): 200 mg via ORAL
  Filled 2014-04-12 (×8): qty 1

## 2014-04-12 MED ORDER — LABETALOL HCL 100 MG PO TABS
100.0000 mg | ORAL_TABLET | Freq: Three times a day (TID) | ORAL | Status: DC
  Administered 2014-04-12 – 2014-04-14 (×5): 100 mg via ORAL
  Filled 2014-04-12 (×8): qty 1

## 2014-04-12 MED ORDER — SODIUM CHLORIDE 0.9 % IV SOLN
100.0000 mL | INTRAVENOUS | Status: DC | PRN
Start: 2014-04-12 — End: 2014-04-12

## 2014-04-12 MED ORDER — DOXAZOSIN MESYLATE 4 MG PO TABS
4.0000 mg | ORAL_TABLET | Freq: Every day | ORAL | Status: DC
  Filled 2014-04-12: qty 1

## 2014-04-12 MED ORDER — PENTAFLUOROPROP-TETRAFLUOROETH EX AERO: 1.0000 "application " | INHALATION_SPRAY | CUTANEOUS | Status: DC | PRN

## 2014-04-12 MED ORDER — NITROGLYCERIN 2 % TD OINT
1.0000 [in_us] | TOPICAL_OINTMENT | Freq: Once | TRANSDERMAL | Status: AC
  Administered 2014-04-12: 1 [in_us] via TOPICAL
  Filled 2014-04-12: qty 1

## 2014-04-12 MED ORDER — CLONIDINE HCL 0.2 MG PO TABS
0.2000 mg | ORAL_TABLET | Freq: Two times a day (BID) | ORAL | Status: DC
  Filled 2014-04-12 (×6): qty 1

## 2014-04-12 MED ORDER — NITROGLYCERIN IN D5W 200-5 MCG/ML-% IV SOLN
2.0000 ug/min | INTRAVENOUS | Status: DC
  Administered 2014-04-12: 7.5 ug/min via INTRAVENOUS
  Administered 2014-04-12: 5 ug/min via INTRAVENOUS
  Filled 2014-04-12: qty 250

## 2014-04-12 MED ORDER — ACETAMINOPHEN 650 MG RE SUPP: 650.0000 mg | Freq: Four times a day (QID) | RECTAL | Status: DC | PRN

## 2014-04-12 MED ORDER — HEPARIN SODIUM (PORCINE) 1000 UNIT/ML DIALYSIS
1000.0000 [IU] | INTRAMUSCULAR | Status: DC | PRN
  Filled 2014-04-12: qty 1

## 2014-04-12 MED ORDER — LIDOCAINE-PRILOCAINE 2.5-2.5 % EX CREA: 1.0000 "application " | TOPICAL_CREAM | CUTANEOUS | Status: DC | PRN

## 2014-04-12 MED ORDER — LIDOCAINE HCL (PF) 1 % IJ SOLN: 5.0000 mL | INTRAMUSCULAR | Status: DC | PRN

## 2014-04-12 MED ORDER — DOCUSATE SODIUM 100 MG PO CAPS
200.0000 mg | ORAL_CAPSULE | Freq: Every day | ORAL | Status: DC
  Filled 2014-04-12 (×2): qty 2

## 2014-04-12 MED ORDER — LISINOPRIL 20 MG PO TABS
20.0000 mg | ORAL_TABLET | Freq: Two times a day (BID) | ORAL | Status: DC
  Administered 2014-04-12: 20 mg via ORAL
  Filled 2014-04-12 (×6): qty 1

## 2014-04-12 MED ORDER — NEPRO/CARBSTEADY PO LIQD
237.0000 mL | ORAL | Status: DC | PRN
  Filled 2014-04-12: qty 237

## 2014-04-12 MED ORDER — HEPARIN SODIUM (PORCINE) 1000 UNIT/ML DIALYSIS: 1000.0000 [IU] | INTRAMUSCULAR | Status: DC | PRN

## 2014-04-12 MED ORDER — LIDOCAINE-PRILOCAINE 2.5-2.5 % EX CREA
1.0000 "application " | TOPICAL_CREAM | CUTANEOUS | Status: DC | PRN
  Filled 2014-04-12: qty 5

## 2014-04-12 MED ORDER — ALTEPLASE 2 MG IJ SOLR
2.0000 mg | Freq: Once | INTRAMUSCULAR | Status: AC | PRN
  Filled 2014-04-12: qty 2

## 2014-04-12 MED ORDER — ALTEPLASE 2 MG IJ SOLR: 2.0000 mg | Freq: Once | INTRAMUSCULAR | Status: DC | PRN

## 2014-04-12 MED ORDER — DOXAZOSIN MESYLATE 4 MG PO TABS
4.0000 mg | ORAL_TABLET | Freq: Every day | ORAL | Status: DC
  Filled 2014-04-12 (×2): qty 1

## 2014-04-12 MED ORDER — PENTAFLUOROPROP-TETRAFLUOROETH EX AERO
1.0000 "application " | INHALATION_SPRAY | CUTANEOUS | Status: DC | PRN
  Filled 2014-04-12: qty 30

## 2014-04-12 MED ORDER — HYDRALAZINE HCL 50 MG PO TABS
100.0000 mg | ORAL_TABLET | Freq: Three times a day (TID) | ORAL | Status: DC
  Administered 2014-04-12 – 2014-04-14 (×5): 100 mg via ORAL
  Filled 2014-04-12 (×8): qty 2

## 2014-04-12 MED ORDER — NEPRO/CARBSTEADY PO LIQD: 237.0000 mL | ORAL | Status: DC | PRN

## 2014-04-12 MED ORDER — ASPIRIN EC 325 MG PO TBEC
325.0000 mg | DELAYED_RELEASE_TABLET | Freq: Every day | ORAL | Status: DC
  Administered 2014-04-13 – 2014-04-14 (×2): 325 mg via ORAL
  Filled 2014-04-12 (×2): qty 1

## 2014-04-12 MED ORDER — ISOSORBIDE DINITRATE 10 MG PO TABS
10.0000 mg | ORAL_TABLET | Freq: Two times a day (BID) | ORAL | Status: DC
  Administered 2014-04-12 – 2014-04-14 (×4): 10 mg via ORAL
  Filled 2014-04-12 (×6): qty 1

## 2014-04-12 MED ORDER — RENA-VITE PO TABS
1.0000 | ORAL_TABLET | Freq: Every day | ORAL | Status: DC
  Administered 2014-04-12: 1 via ORAL
  Filled 2014-04-12 (×3): qty 1

## 2014-04-12 NOTE — ED Notes (Signed)
Report given to dialysis RN.  Nitro d/c per Dr Detterding.  Patient to travel on Bipap.  Patient belongings at bedside.

## 2014-04-12 NOTE — ED Notes (Signed)
IV RN unsuccessful; 2nd IV RN paged; Md notified to seek other options for nitro administration for Hypertensive crisis

## 2014-04-12 NOTE — Progress Notes (Signed)
Call to DR Dhungel  Order for IV access. 2 IV team Nurses earlier tried to get IV peripheral access > Unable to start a line. Pt refused anymore sticks. DR says if needed in an emergency IV team can access Hemo catheter. Pt gets no regularly scheduled meds

## 2014-04-12 NOTE — Progress Notes (Signed)
Subjective: Interval History: [age] yr female with HTN, DM, Gout, smoking, severe behavior disorder.  SOB since HD yest, and worse this am .  Now on BIPAP in ED.  Was here 1/23 to 1/24 for same, pulm edema.  At dialysis Tues and Thurs at [facility] and refused to have fluid taken off.  She was educated on importance of that as was hypertensive, edematous and dyspneic.  Had 3 1/2 off her on 1/23  Objective: Vital signs in last 24 hours: Pulse Rate:  [64-84] 69 (01/29 0754) Resp:  [15-33] 24 (01/29 0754) BP: (190-258)/(57-81) 197/57 mmHg (01/29 0745) SpO2:  [99 %-100 %] 100 % (01/29 0754) FiO2 (%):  [40 %] 40 % (01/29 0754) Weight:  [54.432 kg (120 lb)] 54.432 kg (120 lb) (01/29 0510) Weight change:   Intake/Output from previous day:   Intake/Output this shift:    General appearance: cooperative, moderate distress, pale and on BIPAP Resp: diminished breath sounds bilaterally and rales bilaterally Chest wall: PC on L Cardio: S1, S2 normal, S4 present and systolic murmur: holosystolic 2/6, blowing at apex, LV lift GI: pos bs, liver down 6 cm Extremities: edema 2-3+,  and bilat fem bruits  Lab Results:  Recent Labs  04/12/14 0521  WBC 9.1  HGB 10.7*  HCT 34.0*  PLT 185   BMET:  Recent Labs  04/12/14 0521  NA 140  K 3.6  CL 100  CO2 31  GLUCOSE 160*  BUN 17  CREATININE 5.05*  CALCIUM 9.5   No results for input(s): PTH in the last 72 hours. Iron Studies: No results for input(s): IRON, TIBC, TRANSFERRIN, FERRITIN in the last 72 hours.  Studies/Results: Dg Chest Port 1 View  04/12/2014   CLINICAL DATA:  Respiratory distress.  EXAM: PORTABLE CHEST - 1 VIEW  COMPARISON:  04/06/2014  FINDINGS: Mild cardiac enlargement and pulmonary vascular congestion. Diffuse interstitial pattern to the lungs with perihilar prominence likely representing edema. No blunting of costophrenic angles. No pneumothorax. Left central venous catheter is unchanged in position.  IMPRESSION: Cardiac  enlargement with pulmonary vascular congestion and edema consistent with volume overload. No pleural effusions identified.   Electronically Signed   By: Burman NievesWilliam  Stevens M.D.   On: 04/12/2014 05:42    I have reviewed the patient's current medications.  Assessment/Plan: 1 ESRD severe vol xs due to her behaviors.  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   3 Anemia 4 HPTH get records 5 No perm access 6 HTn needs lower vol, and to take meds. 7 Gout 8 DM per primary  9 Smoking P HD, lower vol, get records.  Have discussed with Charge [facility] at [facility]   LOS: 0 days   Francies Inch L 04/12/2014,8:40 AM

## 2014-04-12 NOTE — Progress Notes (Addendum)
Pt from Dialysis per stretcher CHG bath done, pt alert/oriented uses swearing  language frequently . Oriented to room. On RA 02 sat 100 %. Telling staff she has a PHD, is a Garment/textile technologistnurse anesthetist, and used to model for [employer]. no resp distress demanded food to eat before even getting from stretcher to bed.

## 2014-04-12 NOTE — ED Notes (Signed)
IV RN at bedside to access HD Cath for Nitro drip

## 2014-04-12 NOTE — Progress Notes (Signed)
Pt received HD w/ no complications. 3.1 L fluid removed. Pt alert, vss, report called to [facility] primary RN

## 2014-04-12 NOTE — ED Provider Notes (Addendum)
CSN: [id number]     Arrival date & time 04/12/14  0501 History   First MD Initiated Contact with Patient 04/12/14 0503     Chief Complaint  Patient presents with  . Respiratory Distress     (Consider location/radiation/quality/duration/timing/severity/associated sxs/prior Treatment) HPI 69 year old female presents to the emergency department via EMS from home with complaint of shortness of breath.  Symptoms started acutely this morning.  Patient is a dialysis patient, receives dialysis Tuesday, Thursday, Saturday.  Patient went to dialysis yesterday, reports a normal run.  She denies any chest pain.  History is limited secondary to patient being on BiPAP.  EMS reports upon their arrival patient was diaphoretic, extremely dyspneic and tachypneic.  Patient was placed on sleep apnea Route with moderate improvement.  Patient with similar presentation this past Saturday. Past Medical History  Diagnosis Date  . Diabetes mellitus   . Hyperlipidemia   . Hypertension   . Gout due to renal impairment   . Stroke 1999    TIA  PER PATIENT  . ESRD on dialysis 06/12/2011    Gets dialysis [place], [place], TTS schedule with Dr Louis Meckel, started dialysis in 2010.      Past Surgical History  Procedure Laterality Date  . Carotid endarterectomy  2002    left  . Btl    . Rectal fissurectomy    . Av fistula placement  10/30/2010    right brachiocephalic   . Fistulogram     Family History  Problem Relation Age of Onset  . COPD Mother   . Kidney disease Mother   . Heart disease Father   . Lymphoma Son    History  Substance Use Topics  . Smoking status: Former Smoker -- 1.00 packs/day for 50 years    Types: Cigarettes    Quit date: 02/07/2014  . Smokeless tobacco: Never Used  . Alcohol Use: No   OB History    No data available     Review of Systems  Unable to perform ROS: Severe respiratory distress      Allergies  Codeine; Epinephrine; Percocet; Statins; and Ferrlecit  Home  Medications   Prior to Admission medications   Medication Sig Start Date End Date Taking? Authorizing Provider  amLODipine (NORVASC) 10 MG tablet Take 1 tablet (10 mg total) by mouth at bedtime. 09/16/12   Calvert Cantor, MD  benzonatate (TESSALON) 100 MG capsule Take 1 capsule (100 mg total) by mouth 3 (three) times daily as needed for cough. 04/07/14   Jeralyn Bennett, MD  calcium carbonate (TUMS - DOSED IN MG ELEMENTAL CALCIUM) 500 MG chewable tablet Chew 1 tablet by mouth 3 (three) times daily before meals.    Historical Provider, MD  cloNIDine (CATAPRES) 0.2 MG tablet Take 0.2 mg by mouth 2 (two) times daily.    Historical Provider, MD  Coenzyme Q10 300 MG CAPS Take 300 mg by mouth daily.    Historical Provider, MD  docusate sodium (COLACE) 100 MG capsule Take 200 mg by mouth at bedtime.      Historical Provider, MD  doxazosin (CARDURA) 4 MG tablet Take 4 mg by mouth daily.    Historical Provider, MD  hydrALAZINE (APRESOLINE) 100 MG tablet Take 100 mg by mouth 3 (three) times daily.    Historical Provider, MD  isosorbide dinitrate (ISORDIL) 10 MG tablet Take 1 tablet (10 mg total) by mouth 2 (two) times daily. 04/07/14   Tora Kindred York, PA-C  labetalol (NORMODYNE) 100 MG tablet Take 100 mg by  mouth 2 (two) times daily.    Historical Provider, MD  lisinopril (PRINIVIL,ZESTRIL) 20 MG tablet Take 20 mg by mouth 2 (two) times daily.    Historical Provider, MD  OVER THE COUNTER MEDICATION Take 1 tablet by mouth daily. Renal multivitamin    Historical Provider, MD   BP 230/80 mmHg  Pulse 82  Resp 33  Ht  (1.727 m)  Wt 120 lb (54.432 kg)  BMI 18.25 kg/m2  SpO2 100%  LMP 09/27/2011 Physical Exam  Constitutional: She is oriented to person, place, and time. She appears distressed.  HENT:  Head: Normocephalic and atraumatic.  Nose: Nose normal.  Mouth/Throat: Oropharynx is clear and moist.  Neck: Normal range of motion. Neck supple. JVD present. No tracheal deviation present. No thyromegaly  present.  Pulmonary/Chest: No stridor. She is in respiratory distress. She has rales.  Abdominal: Soft. Bowel sounds are normal. She exhibits no distension and no mass. There is no tenderness. There is no rebound and no guarding.  Musculoskeletal: Normal range of motion. She exhibits edema. She exhibits no tenderness.  Lymphadenopathy:    She has no cervical adenopathy.  Neurological: She is alert and oriented to person, place, and time.  Skin: Skin is warm and dry. No rash noted. No erythema. No pallor.    ED Course  Procedures (including critical care time) Labs Review Labs Reviewed  CBC WITH DIFFERENTIAL/PLATELET - Abnormal; Notable for the following:    RBC 3.82 (*)    Hemoglobin 10.7 (*)    HCT 34.0 (*)    RDW 16.3 (*)    All other components within normal limits  COMPREHENSIVE METABOLIC PANEL  TROPONIN I  BRAIN NATRIURETIC PEPTIDE    Imaging Review Dg Chest Port 1 View  04/12/2014   CLINICAL DATA:  Respiratory distress.  EXAM: PORTABLE CHEST - 1 VIEW  COMPARISON:  04/06/2014  FINDINGS: Mild cardiac enlargement and pulmonary vascular congestion. Diffuse interstitial pattern to the lungs with perihilar prominence likely representing edema. No blunting of costophrenic angles. No pneumothorax. Left central venous catheter is unchanged in position.  IMPRESSION: Cardiac enlargement with pulmonary vascular congestion and edema consistent with volume overload. No pleural effusions identified.   Electronically Signed   By: Burman Nieves M.D.   On: 04/12/2014 05:42     EKG Interpretation   Date/Time:  Friday April 12 2014 05:12:59 EST Ventricular Rate:  80 PR Interval:  156 QRS Duration: 91 QT Interval:  444 QTC Calculation: 512 R Axis:   54 Text Interpretation:  Sinus rhythm Biatrial enlargement Left ventricular  hypertrophy ST elevation, consider anterior injury Prolonged QT interval  No significant change since last tracing Confirmed by Shanel Prazak  MD, Vanellope Passmore  ([id number]) on  04/12/2014 5:39:13 AM     CRITICAL CARE Performed by: Olivia Mackie Total critical care time: 60 min Critical care time was exclusive of separately billable procedures and treating other patients. Critical care was necessary to treat or prevent imminent or life-threatening deterioration. Critical care was time spent personally by me on the following activities: development of treatment plan with patient and/or surrogate as well as nursing, discussions with consultants, evaluation of patient's response to treatment, examination of patient, obtaining history from patient or surrogate, ordering and performing treatments and interventions, ordering and review of laboratory studies, ordering and review of radiographic studies, pulse oximetry and re-evaluation of patient's condition.  MDM   Final diagnoses:  Hypertensive crisis  Acute pulmonary edema  ESRD on dialysis    69 year old female with acute  dyspnea, hypertensive urgency, pulmonary edema.  Will need admission.  She is improving as far as respiratory status goes on BiPAP, awaiting IV access, so that we can start nitro drip.    Olivia Mackielga M Cody Albus, MD 04/12/14 [phone]  Case discussed with Nephrology, cardiology, hospitalist.  Pt to be admitted to stepdown.  Olivia Mackielga M Elester Apodaca, MD 04/12/14 [phone]

## 2014-04-12 NOTE — Progress Notes (Signed)
Attempted to call report to [address].  

## 2014-04-12 NOTE — ED Notes (Signed)
MD at bedside. 

## 2014-04-12 NOTE — ED Notes (Signed)
IV team at bedside 

## 2014-04-12 NOTE — Progress Notes (Signed)
Pt taken off BIPAP per pt request.  Pt tolerating well, RT to monitor.

## 2014-04-12 NOTE — ED Notes (Signed)
Admitting MD at bedside.  Patient is alert and oriented.  She reports she is breathing better.  Patient nitro increased per the orders.  Nitro paste removed due to patient being on Iv nitro

## 2014-04-12 NOTE — H&P (Signed)
[facility] History and Physical  Alexandra [name] NGE:[id number] DOB: 10/07/1945 DOA: 04/12/2014  Referring physician: Dr. Norlene Campbellotter PCP: PROVIDER NOT IN SYSTEM   Chief Complaint:  Acute shortness of breath  HPI:  69 year old female with end-stage renal disease on dialysis (tu, th, sat), hypertension, gout, diastolic CHF and history of diabetes mellitus (not on any medication) who was recently hospitalized for hypertensive  emergency and acute pulmonary edema requiring Cardene drip and underwent urgent hemodialysis brought to the ED this morning by EMS from home after she complained of acute shortness of breath. She reports going to dialysis yesterday and had a full session. In the ED patient was diaphoretic, tachycardic and extremely short of breath. Her blood pressure was 258/78681mmHg and she appeared to be in respiratory distress. Patient was placed on BiPAP and started on nitroglycerin drip.   Patient denies headache, dizziness, fever, chills, nausea , vomiting, chest pain, palpitations,  abdominal pain, bowel symptoms. She reports having leg swellings which are new. She reports that she may not have had enough fluid taken off during dialysis during her last few sessions.  Course in the ED Patient was in respiratory distress and tachycardic with malignant hypertension. After placing on BiPAP and initiation of nitroglycerin drip her blood pressure started to improve to 189/60 mmHg. And appeared much comfortable and was able to speak in full sentences.    Review of Systems:  Constitutional: Denies fever, chills, diaphoresis, appetite change and fatigue.  HEENT: Denies visual or hearing symptoms, neck pain, difficulty swallowing, congestion  Respiratory:  SOB+++, DOE, denies cough, chest tightness,  and wheezing.   Cardiovascular: Denies chest pain, palpitations, leg swelling+.  Gastrointestinal: Denies nausea, vomiting, abdominal pain, diarrhea, constipation, blood in stool and  abdominal distention.  Genitourinary: Denies  flank pain  Endocrine: Denies: hot or cold intolerence Musculoskeletal: Denies myalgias, back pain,  jt pain Skin: Denies , rash and wound.  Neurological: Denies dizziness, syncope, weakness, light-headedness, numbness and headaches.  Psychiatric/Behavioral: Denies confusion   Past Medical History  Diagnosis Date  . Diabetes mellitus   . Hyperlipidemia   . Hypertension   . Gout due to renal impairment   . Stroke 1999    TIA  PER PATIENT  . ESRD on dialysis 06/12/2011    [place], TTS schedule with Dr Louis MeckelStovall, started dialysis in 2010.      Past Surgical History  Procedure Laterality Date  . Carotid endarterectomy  2002    left  . Btl    . Rectal fissurectomy    . Av fistula placement  10/30/2010    right brachiocephalic   . Fistulogram     Social History:  reports that she quit smoking about 2 months ago. Her smoking use included Cigarettes. She has a 50 pack-year smoking history. She has never used smokeless tobacco. She reports that she does not drink alcohol or use illicit drugs.  Allergies  Allergen Reactions  . Codeine Other (See Comments)    Passes out  . Epinephrine Other (See Comments)    Tachycardia, diaphoresis, syncope  . Percocet [Oxycodone-Acetaminophen] Other (See Comments)    Passes  out  . Statins Other (See Comments)    Liver enzymes rise   PATIENT DOES NOT WANT TO BE GIVEN THIS TYPE OF MEDICATION PER [facility]  . Ferrlecit [Na Ferric Gluc Cplx In Sucrose] Diarrhea    Severe diarrhea    Family History  Problem Relation Age of Onset  .  COPD Mother   . Kidney disease Mother   . Heart disease Father   . Lymphoma Son     Prior to Admission medications   Medication Sig Start Date End Date Taking? Authorizing Provider  amLODipine (NORVASC) 10 MG tablet Take 1 tablet (10 mg total) by mouth at bedtime. 09/16/12   Calvert Cantor, MD  benzonatate (TESSALON)  100 MG capsule Take 1 capsule (100 mg total) by mouth 3 (three) times daily as needed for cough. 04/07/14   Jeralyn Bennett, MD  calcium carbonate (TUMS - DOSED IN MG ELEMENTAL CALCIUM) 500 MG chewable tablet Chew 1 tablet by mouth 3 (three) times daily before meals.    Historical Provider, MD  cloNIDine (CATAPRES) 0.2 MG tablet Take 0.2 mg by mouth 2 (two) times daily.    Historical Provider, MD  Coenzyme Q10 300 MG CAPS Take 300 mg by mouth daily.    Historical Provider, MD  docusate sodium (COLACE) 100 MG capsule Take 200 mg by mouth at bedtime.      Historical Provider, MD  doxazosin (CARDURA) 4 MG tablet Take 4 mg by mouth daily.    Historical Provider, MD  hydrALAZINE (APRESOLINE) 100 MG tablet Take 100 mg by mouth 3 (three) times daily.    Historical Provider, MD  isosorbide dinitrate (ISORDIL) 10 MG tablet Take 1 tablet (10 mg total) by mouth 2 (two) times daily. 04/07/14   Tora Kindred York, PA-C  labetalol (NORMODYNE) 100 MG tablet Take 100 mg by mouth 2 (two) times daily.    Historical Provider, MD  lisinopril (PRINIVIL,ZESTRIL) 20 MG tablet Take 20 mg by mouth 2 (two) times daily.    Historical Provider, MD  OVER THE COUNTER MEDICATION Take 1 tablet by mouth daily. Renal multivitamin    Historical Provider, MD     Physical Exam:  Filed Vitals:   04/12/14 0530 04/12/14 0545 04/12/14 0630 04/12/14 0645  BP: 248/76 230/80 208/61 190/61  Pulse: 84 82 64 68  Resp: 28 33 18 15  Height:      Weight:      SpO2: 100% 100% 100% 100%    Constitutional: Vital signs reviewed.  Elderly female lying in bed on BiPAP, not in distress HEENT: On BiPAP, JVD present, neck supple Cardiovascular: RRR, S1 normal, S2 normal, no MRG Chest: Equal air entry bilaterally, coarse bibasilar crackles (L >R), left sided HD catheter Gastrointestinal: Soft. Non-tender, non-distended, bowel sounds are normal,  musculoskeletal: warm, 1+ pitting edema b/l Neurological: Alert and oriented  Labs on Admission:   Basic Metabolic Panel:  Recent Labs Lab 04/06/14 1011 04/06/14 1020 04/07/14 0525 04/12/14 0521  NA 138 138 138 140  K 4.2 4.1 3.5 3.6  CL 101 103 101 100  CO2 25  --  28 31  GLUCOSE 147* 146* 83 160*  BUN 36* 38* 26* 17  CREATININE 7.69* 7.20* 4.92* 5.05*  CALCIUM 9.5  --  9.0 9.5   Liver Function Tests:  Recent Labs Lab 04/12/14 0521  AST 26  ALT 15  ALKPHOS 95  BILITOT 0.6  PROT 7.5  ALBUMIN 3.5   No results for input(s): LIPASE, AMYLASE in the last 168 hours. No results for input(s): AMMONIA in the last 168 hours. CBC:  Recent Labs Lab 04/06/14 1011 04/06/14 1020 04/12/14 0521  WBC 11.0*  --  9.1  NEUTROABS  --   --  6.6  HGB 11.6* 13.9 10.7*  HCT 35.9* 41.0 34.0*  MCV 89.1  --  89.0  PLT 186  --  185   Cardiac Enzymes:  Recent Labs Lab 04/12/14 0521  TROPONINI 0.08*   BNP: Invalid input(s): POCBNP CBG: No results for input(s): GLUCAP in the last 168 hours.  Radiological Exams on Admission: Dg Chest Port 1 View  04/12/2014   CLINICAL DATA:  Respiratory distress.  EXAM: PORTABLE CHEST - 1 VIEW  COMPARISON:  04/06/2014  FINDINGS: Mild cardiac enlargement and pulmonary vascular congestion. Diffuse interstitial pattern to the lungs with perihilar prominence likely representing edema. No blunting of costophrenic angles. No pneumothorax. Left central venous catheter is unchanged in position.  IMPRESSION: Cardiac enlargement with pulmonary vascular congestion and edema consistent with volume overload. No pleural effusions identified.   Electronically Signed   By: Burman Nieves M.D.   On: 04/12/2014 05:42    EKG: Normal sinus rhythm at 80, LDH, J-point elevation in lateral leads (unchanged)  Assessment/Plan  Principal Problem:  Acute hypoxic respiratory failure with Pulmonary edema Likely  triggered by malignant hypertension. Patient placed on BiPAP and started on nitroglycerin drip. Blood pressure and respiratory symptoms have started to improve  in the ED. -Admit to stepdown for close monitoring. -Renal consult has been notified for need of dialysis. -slowly  titrate nitroglycerin drip as BP improves. -Resume home blood pressure medications as outlined below. -Patient has lower extremity edema which she informs to be recent. She has underlying diastolic dysfunction as per previous echo. Will repeat a 2-D echo.    Active Problems: Malignant hypertension/hypertensive emergency This is her second hospitalization for hypertensive emergency. She is on multiple blood pressure medications including clonidine, hydralazine, cardura, amlodipine, lisinopril and labetalol. I will increase dose of labetalol to 100 mg 3 times a day and can titrate up further. She is at maximum dose of her hydralazine, amlodipine and lisinopril. If needed clonidine dose can be increased further.   End-stage renal disease on dialysis Renal has been notified. Will require dialysis today. Further plan per renal consult  Elevated troponin Likely demand ischemia in the setting of acute pulmonary edema. Patient denies any chest pain symptoms. ED physician spoke with Dr. Anne Hahn  who recommended to monitor her and cycle troponins if necessary. Given absence of chest pain symptoms and unchanged EKG findings, would not cycle further troponins. Check 2-D echo    Acute on chronic diastolic CHF (congestive heart failure), NYHA class 1 Check 2-D echo     ? Hx of Diabetes mellitus Not on any medications. Check A1c  Diet: renal  DVT prophylaxis: sq heparin   Code Status: full code Family Communication: None at bedside Disposition Plan: admit to stepdown  Eddie North [facility] Pager [phone]  Total time spent on admission :70 minutes  If 7PM-7AM, please contact night-coverage [email or web address] Password [facility] 04/12/2014, 7:37 AM

## 2014-04-12 NOTE — ED Notes (Signed)
Per Ems pt awaken to SOB; Pt is hypertensive; Pt is dialysis pt on Tues, Thurs, and Sat; last dialysis on 04/11/04; no c/o pain Pt has Rales in all lung fields; Pt comes in on Cpap at 100%; pt had dialysis port on left upper chest; right arm restricted.

## 2014-04-12 NOTE — ED Notes (Signed)
Patient transported with monitor and oxygen via nasal canula.  Tolerated well.  Patient able to speak in full sentences.  The NS kvo remains.  Patient transported to dialysis.  Belongings placed in safe.  paitent has they key in her purse, attached to the yellow copy.  She is keeping her earrings, 4 rings, and bracelet.  She also has cell phone, charger, purse, $4.00, ugg shoes, top.  Patient has 115.00 one credit card and $6.05 in change locked in the safe.

## 2014-04-13 ENCOUNTER — Encounter (HOSPITAL_COMMUNITY): Payer: Self-pay | Admitting: General Practice

## 2014-04-13 DIAGNOSIS — Z992 Dependence on renal dialysis: Secondary | ICD-10-CM

## 2014-04-13 DIAGNOSIS — E119 Type 2 diabetes mellitus without complications: Secondary | ICD-10-CM

## 2014-04-13 DIAGNOSIS — E785 Hyperlipidemia, unspecified: Secondary | ICD-10-CM

## 2014-04-13 DIAGNOSIS — E213 Hyperparathyroidism, unspecified: Secondary | ICD-10-CM

## 2014-04-13 DIAGNOSIS — I10 Essential (primary) hypertension: Secondary | ICD-10-CM

## 2014-04-13 DIAGNOSIS — D6489 Other specified anemias: Secondary | ICD-10-CM

## 2014-04-13 DIAGNOSIS — N186 End stage renal disease: Secondary | ICD-10-CM | POA: Diagnosis present

## 2014-04-13 DIAGNOSIS — Z9114 Patient's other noncompliance with medication regimen: Secondary | ICD-10-CM | POA: Diagnosis present

## 2014-04-13 LAB — BASIC METABOLIC PANEL
Anion gap: 8 (ref 5–15)
BUN: 7 mg/dL (ref 6–23)
CHLORIDE: 100 mmol/L (ref 96–112)
CO2: 31 mmol/L (ref 19–32)
CREATININE: 2.5 mg/dL — AB (ref 0.50–1.10)
Calcium: 8.9 mg/dL (ref 8.4–10.5)
GFR calc Af Amer: 21 mL/min — ABNORMAL LOW (ref >90)
GFR calc non Af Amer: 18 mL/min — ABNORMAL LOW (ref >90)
Glucose, Bld: 132 mg/dL — ABNORMAL HIGH (ref 70–99)
POTASSIUM: 3.3 mmol/L — AB (ref 3.5–5.1)
SODIUM: 139 mmol/L (ref 135–145)

## 2014-04-13 LAB — CBC
HCT: 34 % — ABNORMAL LOW (ref 36.0–46.0)
Hemoglobin: 10.9 g/dL — ABNORMAL LOW (ref 12.0–15.0)
MCH: 28.1 pg (ref 26.0–34.0)
MCHC: 32.1 g/dL (ref 30.0–36.0)
MCV: 87.6 fL (ref 78.0–100.0)
PLATELETS: 168 10*3/uL (ref 150–400)
RBC: 3.88 MIL/uL (ref 3.87–5.11)
RDW: 16.2 % — ABNORMAL HIGH (ref 11.5–15.5)
WBC: 5.1 10*3/uL (ref 4.0–10.5)

## 2014-04-13 LAB — HEMOGLOBIN A1C
HEMOGLOBIN A1C: 5.2 % (ref 4.8–5.6)
Mean Plasma Glucose: 103 mg/dL

## 2014-04-13 MED ORDER — LABETALOL HCL 100 MG PO TABS: 100.0000 mg | ORAL_TABLET | Freq: Three times a day (TID) | ORAL | Status: AC

## 2014-04-13 NOTE — Progress Notes (Signed)
Subjective: Interval History: has no complaint , feels better, [facility]  Objective: Vital signs in last 24 hours: Temp:  [97.8 F (36.6 C)-99.2 F (37.3 C)] 98.7 F (37.1 C) (01/30 0835) Pulse Rate:  [69-98] 73 (01/30 0930) Resp:  [15-23] 18 (01/30 0930) BP: (122-208)/(40-135) 126/56 mmHg (01/30 0930) SpO2:  [99 %-100 %] 100 % (01/30 0835) Weight:  [52.9 kg (116 lb 10 oz)-54.069 kg (119 lb 3.2 oz)] 52.9 kg (116 lb 10 oz) (01/30 0835) Weight change: -0.832 kg (-1 lb 13.3 oz)  Intake/Output from previous day: 01/29 0701 - 01/30 0700 In: 100 [P.O.:100] Out: 3009  Intake/Output this shift:    General appearance: alert, cooperative and no distress Resp: diminished breath sounds bilaterally and rales bibasilar Chest wall: L IJ cath Cardio: S1, S2 normal, systolic murmur: holosystolic 2/6, blowing at apex and LV lift GI: pos bs, liver down 5 cm Extremities: edema 1 + pedal, 2+ presacral  Lab Results:  Recent Labs  04/12/14 0521  WBC 9.1  HGB 10.7*  HCT 34.0*  PLT 185   BMET:  Recent Labs  04/12/14 0521  NA 140  K 3.6  CL 100  CO2 31  GLUCOSE 160*  BUN 17  CREATININE 5.05*  CALCIUM 9.5   No results for input(s): PTH in the last 72 hours. Iron Studies: No results for input(s): IRON, TIBC, TRANSFERRIN, FERRITIN in the last 72 hours.  Studies/Results: Dg Chest Port 1 View  04/12/2014   CLINICAL DATA:  Respiratory distress.  EXAM: PORTABLE CHEST - 1 VIEW  COMPARISON:  04/06/2014  FINDINGS: Mild cardiac enlargement and pulmonary vascular congestion. Diffuse interstitial pattern to the lungs with perihilar prominence likely representing edema. No blunting of costophrenic angles. No pneumothorax. Left central venous catheter is unchanged in position.  IMPRESSION: Cardiac enlargement with pulmonary vascular congestion and edema consistent with volume overload. No pleural effusions identified.   Electronically Signed   By: Burman NievesWilliam  Stevens M.D.   On:  04/12/2014 05:42    I have reviewed the patient's current medications.  Assessment/Plan: 1 ESRD 2nd day in a row.  Will see if can get off 4 liters today, got 3 liters off yest.  Refuses vol off at her [facility]. 2Anemia stable 3 CM controlled 4 ^ lipids 5 HPTH meds 6 HTN lower with lower vol.  Very volatile with ^^ PVR and vol sensitive 7 Fluid overload resolving 8 Nonadherence P HD, bp meds, can d/c anytime from our standpoint.    LOS: 1 day   Kymari Lollis L 04/13/2014,9:55 AM

## 2014-04-13 NOTE — Procedures (Signed)
I was present at this session.  I have reviewed the session itself and made appropriate changes.  Hd via L IJ cath.  BP improving with vol off.   Alexandra [name] L 1/30/20169:54 AM

## 2014-04-13 NOTE — Progress Notes (Signed)
Patient requesting blood pressure to be taken multiple times after hemodialysis.  See flow sheet.

## 2014-04-13 NOTE — Discharge Summary (Signed)
Physician Discharge Summary  Alexandra [name] NWG:[id number] DOB: 02/25/1946 DOA: 04/12/2014  PCP: PROVIDER NOT IN SYSTEM  Admit date: 04/12/2014 Discharge date: 04/13/2014  Time spent: 40 minutes  Recommendations for Outpatient Follow-up:  Acute pulmonary edema -Secondary to noncompliance, recent resolved with emergent dialysis  ESRD on HD T/Th/Sat, -Return to regular HD schedule -Strict adherence to regular HD schedule. -Strict adherence to renal diet -Follow-up with PCP  Anemia stable - follow-up with PCP   Diabetes type 2 controlled -1/29 hemoglobin A1c= 5.2 -Continue current medication regimen -PCP to monitor  HLD -PCP to follow; within our Epic EMR patient has not had cholesterol panel drawn since 2013, per ADA guidelines should have 1 drawn minimum one time per year -PCP to obtain and start medication if required  HPTH  -Medication per patient's nephrologist   Malignant HTN  -Resolved with emergent hemodialysis -Continue amlodipine 10 mg daily -Continue Catapres 0.2 mg BID -Cardura 4 mg QHS -Hydralazine 100 mgTID -Isosorbide dinitrate 10 mg BID -Labetalol 100 mg TID -Lisinopril 10 mgBID -PCP to monitor BP and make adjustments to medication  Noncompliance  -Consult needs to be strictly adherent to HD schedule, renal diet, and BP medications Nonadherence    Discharge Diagnoses:  Principal Problem:   Pulmonary edema Active Problems:   Hyperparathyroidism, secondary renal   Hyperlipidemia   ESRD on dialysis   Diabetes mellitus   HTN (hypertension), malignant   Acute on chronic diastolic CHF (congestive heart failure), NYHA class 1   Acute pulmonary edema   Hypertensive crisis   Malignant hypertension   Acute respiratory failure   Diabetes type 2, controlled   End-stage renal disease on hemodialysis   Anemia due to other cause   HLD (hyperlipidemia)   Hyperparathyroidism   Noncompliance with medications   Discharge Condition:  Stable  Diet recommendation: Renal diet  Filed Weights   04/12/14 2215 04/13/14 0835 04/13/14 1246  Weight: 54.069 kg (119 lb 3.2 oz) 52.9 kg (116 lb 10 oz) 50.2 kg (110 lb 10.7 oz)    History of present illness:  69 year old female BF PMHx ESRD on HD T/Th/Sat, hypertension, gout, diastolic CHF, diabetes Type 2, noncompliance. Recently hospitalized for hypertensive emergency and acute pulmonary edema requiring Cardene drip and underwent urgent hemodialysis brought to the ED this morning by EMS from home after she complained of acute shortness of breath. She reports going to dialysis yesterday and had a full session. In the ED patient was diaphoretic, tachycardic and extremely short of breath. Her blood pressure was 258/41mmHg and she appeared to be in respiratory distress. Patient was placed on BiPAP and started on nitroglycerin drip.  Patient denies headache, dizziness, fever, chills, nausea , vomiting, chest pain, palpitations, abdominal pain, bowel symptoms. She reports having leg swellings which are new. She reports that she may not have had enough fluid taken off during dialysis during her last few sessions.  Course in the ED Patient was in respiratory distress and tachycardic with malignant hypertension. After placing on BiPAP and initiation of nitroglycerin drip her blood pressure started to improve to 189/60 mmHg. And appeared much comfortable and was able to speak in full sentences.   Hospital Course:  Secondary to noncompliance patient was found to be in acute pulmonary edema, hypertensive emergency. Patient required multiple emergent hemodialysis sessions. Patient now stable for discharge  Procedures: Emergent hemodialysis 2 sessions   Consultations: Dr. Fayrene Fearing Deter[place] (nephrology)   Antibiotics NA   Discharge Exam: Filed Vitals:   04/13/14 1246 04/13/14 1428 04/13/14 1505  04/13/14 1535  BP: 160/42 160/42 192/59 137/53  Pulse: 85 61 61 76  Temp: 99 F (37.2 C)  98.2 F (36.8 C) 98.4 F (36.9 C) 98 F (36.7 C)  TempSrc: Oral Oral Oral Oral  Resp: 15 16 18 16   Height:      Weight: 50.2 kg (110 lb 10.7 oz)     SpO2: 100% 100% 100% 100%    General: A/O 4, NAD, Cardiovascular: Regular rhythm or rate, negative murmurs rubs or gallops, normal S1/S2 Respiratory: Clear to auscultation bilateral  Discharge Instructions     Medication List    TAKE these medications        amLODipine 10 MG tablet  Commonly known as:  NORVASC  Take 1 tablet (10 mg total) by mouth at bedtime.     benzonatate 100 MG capsule  Commonly known as:  TESSALON  Take 1 capsule (100 mg total) by mouth 3 (three) times daily as needed for cough.     calcium carbonate 500 MG chewable tablet  Commonly known as:  TUMS - dosed in mg elemental calcium  Chew 1 tablet by mouth 3 (three) times daily before meals.     cloNIDine 0.2 MG tablet  Commonly known as:  CATAPRES  Take 0.2 mg by mouth 2 (two) times daily.     CoQ10 100 MG Caps  Take 100-300 mg by mouth daily.     docusate sodium 100 MG capsule  Commonly known as:  COLACE  Take 200 mg by mouth at bedtime.     doxazosin 4 MG tablet  Commonly known as:  CARDURA  Take 4 mg by mouth daily.     hydrALAZINE 100 MG tablet  Commonly known as:  APRESOLINE  Take 100 mg by mouth 3 (three) times daily.     isosorbide dinitrate 10 MG tablet  Commonly known as:  ISORDIL  Take 1 tablet (10 mg total) by mouth 2 (two) times daily.     labetalol 100 MG tablet  Commonly known as:  NORMODYNE  Take 1 tablet (100 mg total) by mouth 3 (three) times daily.     lisinopril 20 MG tablet  Commonly known as:  PRINIVIL,ZESTRIL  Take 20 mg by mouth 2 (two) times daily.     RENAL VITAMIN PO  Take 1 tablet by mouth daily.       Allergies  Allergen Reactions  . Codeine Other (See Comments)    Passes out  . Epinephrine Other (See Comments)    Tachycardia, diaphoresis, syncope  . Percocet [Oxycodone-Acetaminophen] Other (See  Comments)    Passes  out  . Statins Other (See Comments)    Liver enzymes rise   PATIENT DOES NOT WANT TO BE GIVEN THIS TYPE OF MEDICATION PER THE [facility]  . Ferrlecit [Na Ferric Gluc Cplx In Sucrose] Diarrhea    Severe diarrhea      The results of significant diagnostics from this hospitalization (including imaging, microbiology, ancillary and laboratory) are listed below for reference.    Significant Diagnostic Studies: Dg Chest Port 1 View  04/12/2014   CLINICAL DATA:  Respiratory distress.  EXAM: PORTABLE CHEST - 1 VIEW  COMPARISON:  04/06/2014  FINDINGS: Mild cardiac enlargement and pulmonary vascular congestion. Diffuse interstitial pattern to the lungs with perihilar prominence likely representing edema. No blunting of costophrenic angles. No pneumothorax. Left central venous catheter is unchanged in position.  IMPRESSION: Cardiac enlargement with pulmonary vascular congestion and edema consistent with volume overload.  No pleural effusions identified.   Electronically Signed   By: Burman Nieves M.D.   On: 04/12/2014 05:42   Dg Chest Portable 1 View  04/06/2014   CLINICAL DATA:  Initial encounter for respiratory distress  EXAM: PORTABLE CHEST - 1 VIEW  COMPARISON:  09/14/2012, 11/21/2011, 01/16/2010  FINDINGS: There is a left jugular central line with tip in the low SVC. There is moderate vascular fullness and mild diffuse interstitial thickening suggesting volume overload. There are tiny pleural effusions bilaterally. There is no focal airspace consolidation. There is a mass in the left thoracic inlet with deformity and contralateral displacement of the tracheal air column. This probably represents a goiter. It has been present without significant change since at least 01/16/2010. Hilar and mediastinal contours are otherwise unremarkable. Heart size is upper normal.  IMPRESSION: Vascular and interstitial changes consistent with volume overload. No frank  alveolar edema. Tiny pleural effusions.  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   Electronically Signed   By: Ellery Plunk M.D.   On: 04/06/2014 10:15    Microbiology: Recent Results (from the past 240 hour(s))  MRSA PCR Screening     Status: None   Collection Time: 04/06/14  5:23 PM  Result Value Ref Range Status   MRSA by PCR NEGATIVE NEGATIVE Final    Comment:        The GeneXpert MRSA Assay (FDA approved for NASAL specimens only), is one component of a comprehensive MRSA colonization surveillance program. It is not intended to diagnose MRSA infection nor to guide or monitor treatment for MRSA infections.      Labs: Basic Metabolic Panel:  Recent Labs Lab 04/07/14 0525 04/12/14 0521  NA 138 140  K 3.5 3.6  CL 101 100  CO2 28 31  GLUCOSE 83 160*  BUN 26* 17  CREATININE 4.92* 5.05*  CALCIUM 9.0 9.5   Liver Function Tests:  Recent Labs Lab 04/12/14 0521  AST 26  ALT 15  ALKPHOS 95  BILITOT 0.6  PROT 7.5  ALBUMIN 3.5   No results for input(s): LIPASE, AMYLASE in the last 168 hours. No results for input(s): AMMONIA in the last 168 hours. CBC:  Recent Labs Lab 04/12/14 0521 04/13/14 1730  WBC 9.1 5.1  NEUTROABS 6.6  --   HGB 10.7* 10.9*  HCT 34.0* 34.0*  MCV 89.0 87.6  PLT 185 168   Cardiac Enzymes:  Recent Labs Lab 04/12/14 0521  TROPONINI 0.08*   BNP: BNP (last 3 results) No results for input(s): PROBNP in the last 8760 hours. CBG: No results for input(s): GLUCAP in the last 168 hours.     Signed:  Carolyne Littles, MD [facility] [phone] pager

## 2014-04-14 NOTE — Progress Notes (Signed)
Pt refuses VTE prophylaxis SQ heparin. Pt educated and understands risk of not taking med.

## 2014-04-14 NOTE — Progress Notes (Signed)
Pt BP taken at 2300 to be 178/63 which pt states as her baseline. She was willing to take her scheduled norvasc, hydralazine, and labetolol. Pt refused to take clonidine and lisinopril. However, she requested to have her BP rechecked in an hour and then she would take the lisinopril. BP went down to 166/47 and said that it was too low to take the lisinopril. Pt very concerned for the dropping of her BP. Pt worried about BP being below systolic 150 and considers this hypotensive after explanation and education given to pt. Nothing further done at this time, but will continue to monitor.

## 2014-05-02 DIAGNOSIS — N185 Chronic kidney disease, stage 5: Secondary | ICD-10-CM | POA: Insufficient documentation

## 2014-06-18 ENCOUNTER — Other Ambulatory Visit: Payer: Self-pay

## 2014-06-18 NOTE — Patient Outreach (Signed)
[facility]) Care Management  06/18/2014  Alexandra PoreRosalyn Harris [name] 11/17/1945 [id number]  Care Manager received voice message from Alexandra [name] requesting care manager return her call regarding [facility].   Alexandra SheriffJuana Durante Violett, RN, MSN, [facility],CCM [facility] Community Care Coordinator Cell: [phone]

## 2014-06-18 NOTE — Patient Outreach (Signed)
[facility]) Care Management  06/18/2014  Alexandra PoreRosalyn Harris [name] 02/28/1946 [id number]  Care Manager returned call to member who states, she is at dialysis on the machine at this time. Ms. Alexandra [name] reports she was told by dialysis staff her blood pressure was "200 today". Ms. Alexandra [name] states that she was seen by one of the nephrologist today. Ms. Alexandra [name] reports that she is going to follow up with primary care physician also. Ms. Alexandra RiffleHarris [name] reports waiting on a call from her Goddaughter and Power of Attorney, Alexandra [name] who is coordinating a meeting with Alexandra [name]. No care management needs identified during this call.  Alexandra SheriffJuana Demico Ploch, RN, MSN, [facility],CCM [facility] Community Care Coordinator Cell: [phone]

## 2014-06-19 ENCOUNTER — Telehealth: Payer: Self-pay

## 2014-06-19 ENCOUNTER — Other Ambulatory Visit: Payer: Self-pay

## 2014-06-19 NOTE — Patient Outreach (Signed)
[facility]) Care Management  06/19/2014  Alexandra PoreRosalyn Harris [name] 09/21/1945 [id number]  Follow up call-[name] identified that she wanted bath aide to start back. Reported that Dr. Ivin Bootyrews performed a procedure on her right arm and stated Dr. Ivin Bootyrews was ordering physical therapy to start back. Care Manager recommended she request Dr. Ivin Bootyrews request a bath aide as he is referring her to home heath physical therapy. [name] stated "I'll do that" and thanked care Production designer, theatre/television/filmmanager. No other needs identified at this time. Case remains closed.  Kathyrn SheriffJuana Kenneisha Cochrane, RN, MSN, [facility],CCM [facility] Community Care Coordinator Cell: [phone]

## 2014-06-19 NOTE — Patient Outreach (Signed)
CSW received at call from patient's social worker at dialysis clinic. Social worker states that patient no longer has access to [place] transportation and [facility]. CSW informs social worker that patient refused contracted transportation service and due to conflict with drivers was prevented from using [employer]. Patient unable to use [facility] was linked with [facility] transportation prior to discharge. CSW provided information for local 12 and go services. CSW informed social worker that should patient need referral for services or have other goals [facility] would he happy to assist.

## 2014-08-23 NOTE — Patient Outreach (Signed)
[facility]) Care Management  08/23/2014  Alexandra [name] [birth year] [id number]   Referral from [facility], assigned Donato Schultz, RN.  [name]. [name] [facility] Care Management [facility] CM Assistant Phone: [phone] Fax: [phone]

## 2014-08-26 ENCOUNTER — Other Ambulatory Visit: Payer: Self-pay

## 2014-08-26 DIAGNOSIS — Z599 Problem related to housing and economic circumstances, unspecified: Secondary | ICD-10-CM

## 2014-08-27 ENCOUNTER — Other Ambulatory Visit: Payer: Self-pay

## 2014-08-27 NOTE — Patient Outreach (Signed)
[facility]) Care Management  08/27/2014  Paiten Lowy [place] 07-19-1945 [id number]  Care Coordination: call to [facility] to obtain contact number for member's home health nurse and to inform of member expressed need for bath aide.  [name], RN, MSN, [facility] Community Care Coordinator Cell: [phone]

## 2014-08-27 NOTE — Patient Outreach (Signed)
[facility]) Care Management  08/27/2014  Alexandra [name] 12/18/1945 [id number]  Received referral from [facility]. Call to assess member needs. Member states, "I need someone to come out to clean my house and help me with my bath. Member reports she is active with [facility] for nursing and occupational therapy.  Member states "I do like them".  Member reports she needs someone to do "light housekeeping" maybe once a month.   Member reports she is following up with her providers. She has an appointment with Dr. Ivin Booty and Dr. Tenny Craw. Per member Dr. Ivin Booty is going to do an ultrasound of her graft and if it is ok then they will begin using her graft, stating "and that would be wonderful".    Member reports her appetite is good and she has gained weight. Denies any nutritional supplements needs at this time. "I am working on getting my dentures, now that I am getting better and out of the hospital". Member also reports she needs to have an eye examination and questions ophthalmology benefits. Care manager encouraged member to contact the customer service number on the back of her insurance card. Member stated that she would do that.   Member states she only needs personal care assistance and would like to be able to take [facility]. Care Manager explained there was no agency known to care manager that will provide personal care services free of charge. Member is agreeable to Writer to discuss community resources.   Plan: Social Work referral and Futures trader will call [facility] to request addition of bath aide.

## 2014-08-29 ENCOUNTER — Other Ambulatory Visit: Payer: Self-pay

## 2014-08-29 NOTE — Patient Outreach (Signed)
[facility]) Care Management  08/29/2014  Alexandra [name] 06-28-1945 [id number]   Patient assigned to [name], RN

## 2014-08-29 NOTE — Patient Outreach (Signed)
[facility]) Care Management  08/29/2014  Alexandra [name] 05-02-1945 [id number]   Assessment: Received return call from Melissa at [facility] stating that she put in for member to restart bath aide.  Plan: follow up with member to update.  [name], RN, MSN, [facility] Community Care Coordinator Cell: [phone]

## 2014-08-29 NOTE — Patient Outreach (Signed)
[facility]) Care Management  08/29/2014  Alexandra [name] 13-Feb-1946 [id number]   Request from [name], RN to assign SW, assigned [name], RN.  [name]. [name] [facility] Care Management [facility] CM Assistant Phone: [phone] Fax: [phone]

## 2014-08-29 NOTE — Patient Outreach (Signed)
[facility]) Care Management  08/29/2014  Alexandra [name] [facility] November 28, 1945 [id number]   No return call from agency-Call to follow up regarding home care involvement.   Plan: follow up call by the end of the week if no return call.   [name], RN, MSN, [facility] Community Care Coordinator Cell: [phone]

## 2014-08-30 ENCOUNTER — Other Ambulatory Visit: Payer: Self-pay

## 2014-08-30 NOTE — Patient Outreach (Addendum)
[facility]) Care Management  08/30/2014  Octavie Westerhold [place] Apr 21, 1945 [id number]  Care Coordination-Call to member to follow up regarding bath aide. Member states she was called this morning by [facility] and informed that the bath aide would be starting. Care Management Coordinator also reinforced that [facility] worker should be reaching out to her within the next 1-2 weeks. Member states that [employer] [employer] [facility] intake person was out at her house this morning... "So everything is looking up". Member denies any disease management or educational needs. Member denies any care management needs from nursing service at this time. Care Manager discussed closing out of case. Member is agreeable, Has acknowledged that she has care manager's contact number.  Plan: [facility] Work to follow up.  [facility] CM Care Plan Problem One        Patient Outreach Telephone from 08/30/2014 in [place] Problem One  Needs bath aide   Care Plan for Problem One  Not Active   [facility] CM Short Term Goal #1 (0-30 days)  Member will verbalize bath aide start of care within the next 1-2 weeks.   [facility] CM Short Term Goal #1 Start Date  08/27/14   [facility] CM Short Term Goal #1 Met Date  08/30/14 [Informed by [facility] of bath aide start date.]     Thea Silversmith, RN, MSN, [place] Coordinator Cell: [phone]

## 2014-09-04 ENCOUNTER — Inpatient Hospital Stay (HOSPITAL_COMMUNITY)
Admission: EM | Admit: 2014-09-04 | Discharge: 2014-09-13 | DRG: 291 | Disposition: E | Payer: Commercial Managed Care - HMO | Attending: Internal Medicine | Admitting: Internal Medicine

## 2014-09-04 ENCOUNTER — Encounter (HOSPITAL_COMMUNITY): Payer: Self-pay

## 2014-09-04 ENCOUNTER — Emergency Department (HOSPITAL_COMMUNITY): Payer: Commercial Managed Care - HMO

## 2014-09-04 DIAGNOSIS — Z8673 Personal history of transient ischemic attack (TIA), and cerebral infarction without residual deficits: Secondary | ICD-10-CM

## 2014-09-04 DIAGNOSIS — I4901 Ventricular fibrillation: Secondary | ICD-10-CM | POA: Diagnosis not present

## 2014-09-04 DIAGNOSIS — I120 Hypertensive chronic kidney disease with stage 5 chronic kidney disease or end stage renal disease: Secondary | ICD-10-CM | POA: Diagnosis present

## 2014-09-04 DIAGNOSIS — J9691 Respiratory failure, unspecified with hypoxia: Secondary | ICD-10-CM | POA: Diagnosis present

## 2014-09-04 DIAGNOSIS — Z9289 Personal history of other medical treatment: Secondary | ICD-10-CM | POA: Diagnosis not present

## 2014-09-04 DIAGNOSIS — G9340 Encephalopathy, unspecified: Secondary | ICD-10-CM

## 2014-09-04 DIAGNOSIS — Z9115 Patient's noncompliance with renal dialysis: Secondary | ICD-10-CM | POA: Diagnosis present

## 2014-09-04 DIAGNOSIS — E875 Hyperkalemia: Secondary | ICD-10-CM | POA: Diagnosis not present

## 2014-09-04 DIAGNOSIS — R680 Hypothermia, not associated with low environmental temperature: Secondary | ICD-10-CM | POA: Diagnosis present

## 2014-09-04 DIAGNOSIS — I5043 Acute on chronic combined systolic (congestive) and diastolic (congestive) heart failure: Principal | ICD-10-CM | POA: Diagnosis present

## 2014-09-04 DIAGNOSIS — I469 Cardiac arrest, cause unspecified: Secondary | ICD-10-CM | POA: Diagnosis not present

## 2014-09-04 DIAGNOSIS — R739 Hyperglycemia, unspecified: Secondary | ICD-10-CM | POA: Diagnosis present

## 2014-09-04 DIAGNOSIS — R0602 Shortness of breath: Secondary | ICD-10-CM

## 2014-09-04 DIAGNOSIS — J9600 Acute respiratory failure, unspecified whether with hypoxia or hypercapnia: Secondary | ICD-10-CM

## 2014-09-04 DIAGNOSIS — R451 Restlessness and agitation: Secondary | ICD-10-CM | POA: Diagnosis present

## 2014-09-04 DIAGNOSIS — D631 Anemia in chronic kidney disease: Secondary | ICD-10-CM | POA: Diagnosis present

## 2014-09-04 DIAGNOSIS — K625 Hemorrhage of anus and rectum: Secondary | ICD-10-CM | POA: Diagnosis not present

## 2014-09-04 DIAGNOSIS — J810 Acute pulmonary edema: Secondary | ICD-10-CM | POA: Diagnosis not present

## 2014-09-04 DIAGNOSIS — N186 End stage renal disease: Secondary | ICD-10-CM | POA: Diagnosis present

## 2014-09-04 DIAGNOSIS — Z9119 Patient's noncompliance with other medical treatment and regimen: Secondary | ICD-10-CM | POA: Diagnosis present

## 2014-09-04 DIAGNOSIS — I10 Essential (primary) hypertension: Secondary | ICD-10-CM

## 2014-09-04 DIAGNOSIS — Z992 Dependence on renal dialysis: Secondary | ICD-10-CM

## 2014-09-04 DIAGNOSIS — R0600 Dyspnea, unspecified: Secondary | ICD-10-CM | POA: Diagnosis not present

## 2014-09-04 HISTORY — DX: Chronic obstructive pulmonary disease, unspecified: J44.9

## 2014-09-04 HISTORY — DX: Heart failure, unspecified: I50.9

## 2014-09-04 HISTORY — DX: Peripheral vascular disease, unspecified: I73.9

## 2014-09-04 LAB — BLOOD GAS, ARTERIAL
Acid-base deficit: 9.1 mmol/L — ABNORMAL HIGH (ref 0.0–2.0)
BICARBONATE: 15.7 meq/L — AB (ref 20.0–24.0)
Drawn by: 44138
FIO2: 0.4 %
MECHVT: 530 mL
O2 Saturation: 98.7 %
PCO2 ART: 32 mmHg — AB (ref 35.0–45.0)
PEEP/CPAP: 5 cmH2O
PH ART: 7.316 — AB (ref 7.350–7.450)
PO2 ART: 170 mmHg — AB (ref 80.0–100.0)
Patient temperature: 99.5
RATE: 18 resp/min
TCO2: 16.7 mmol/L (ref 0–100)

## 2014-09-04 LAB — COMPREHENSIVE METABOLIC PANEL
ALK PHOS: 114 U/L (ref 38–126)
ALT: 13 U/L — AB (ref 14–54)
ALT: 12 U/L — ABNORMAL LOW (ref 14–54)
ANION GAP: 19 — AB (ref 5–15)
AST: 37 U/L (ref 15–41)
AST: 45 U/L — ABNORMAL HIGH (ref 15–41)
Albumin: 3.9 g/dL (ref 3.5–5.0)
Albumin: 3.1 g/dL — ABNORMAL LOW (ref 3.5–5.0)
Alkaline Phosphatase: 90 U/L (ref 38–126)
Anion gap: 21 — ABNORMAL HIGH (ref 5–15)
BUN: 57 mg/dL — ABNORMAL HIGH (ref 6–20)
BUN: 55 mg/dL — ABNORMAL HIGH (ref 6–20)
CALCIUM: 9.5 mg/dL (ref 8.9–10.3)
CO2: 17 mmol/L — AB (ref 22–32)
CO2: 18 mmol/L — ABNORMAL LOW (ref 22–32)
CREATININE: 10.76 mg/dL — AB (ref 0.44–1.00)
Calcium: 9.7 mg/dL (ref 8.9–10.3)
Chloride: 102 mmol/L (ref 101–111)
Chloride: 104 mmol/L (ref 101–111)
Creatinine, Ser: 11.11 mg/dL — ABNORMAL HIGH (ref 0.44–1.00)
GFR calc Af Amer: 3 mL/min — ABNORMAL LOW (ref >60)
GFR calc Af Amer: 4 mL/min — ABNORMAL LOW (ref >60)
GFR calc non Af Amer: 3 mL/min — ABNORMAL LOW (ref >60)
GFR, EST NON AFRICAN AMERICAN: 3 mL/min — AB (ref >60)
GLUCOSE: 148 mg/dL — AB (ref 65–99)
Glucose, Bld: 177 mg/dL — ABNORMAL HIGH (ref 65–99)
POTASSIUM: 6.2 mmol/L — AB (ref 3.5–5.1)
Potassium: 4.6 mmol/L (ref 3.5–5.1)
SODIUM: 140 mmol/L (ref 135–145)
Sodium: 141 mmol/L (ref 135–145)
Total Bilirubin: 0.9 mg/dL (ref 0.3–1.2)
Total Bilirubin: 0.7 mg/dL (ref 0.3–1.2)
Total Protein: 7.9 g/dL (ref 6.5–8.1)
Total Protein: 6.1 g/dL — ABNORMAL LOW (ref 6.5–8.1)

## 2014-09-04 LAB — APTT: aPTT: 24 seconds (ref 24–37)

## 2014-09-04 LAB — I-STAT ARTERIAL BLOOD GAS, ED
Acid-base deficit: 7 mmol/L — ABNORMAL HIGH (ref 0.0–2.0)
Bicarbonate: 18.5 mEq/L — ABNORMAL LOW (ref 20.0–24.0)
O2 Saturation: 100 %
Patient temperature: 98.6
TCO2: 20 mmol/L (ref 0–100)
pCO2 arterial: 35.7 mmHg (ref 35.0–45.0)
pH, Arterial: 7.322 — ABNORMAL LOW (ref 7.350–7.450)
pO2, Arterial: 182 mmHg — ABNORMAL HIGH (ref 80.0–100.0)

## 2014-09-04 LAB — PROTIME-INR
INR: 1.06 (ref 0.00–1.49)
Prothrombin Time: 14 seconds (ref 11.6–15.2)

## 2014-09-04 LAB — MRSA PCR SCREENING: MRSA by PCR: NEGATIVE

## 2014-09-04 LAB — I-STAT CHEM 8, ED
BUN: 84 mg/dL — ABNORMAL HIGH (ref 6–20)
CREATININE: 11.5 mg/dL — AB (ref 0.44–1.00)
Calcium, Ion: 1.07 mmol/L — ABNORMAL LOW (ref 1.13–1.30)
Chloride: 109 mmol/L (ref 101–111)
GLUCOSE: 147 mg/dL — AB (ref 65–99)
HEMATOCRIT: 42 % (ref 36.0–46.0)
HEMOGLOBIN: 14.3 g/dL (ref 12.0–15.0)
POTASSIUM: 7.6 mmol/L — AB (ref 3.5–5.1)
SODIUM: 134 mmol/L — AB (ref 135–145)
TCO2: 18 mmol/L (ref 0–100)

## 2014-09-04 LAB — CBC WITH DIFFERENTIAL/PLATELET
BASOS ABS: 0.1 10*3/uL (ref 0.0–0.1)
BASOS ABS: 0 10*3/uL (ref 0.0–0.1)
Basophils Relative: 0 % (ref 0–1)
Basophils Relative: 0 % (ref 0–1)
EOS ABS: 0.2 10*3/uL (ref 0.0–0.7)
EOS PCT: 0 % (ref 0–5)
Eosinophils Absolute: 0 10*3/uL (ref 0.0–0.7)
Eosinophils Relative: 1 % (ref 0–5)
HCT: 37.5 % (ref 36.0–46.0)
HEMATOCRIT: 32.9 % — AB (ref 36.0–46.0)
HEMOGLOBIN: 11.7 g/dL — AB (ref 12.0–15.0)
HEMOGLOBIN: 10.2 g/dL — AB (ref 12.0–15.0)
LYMPHS PCT: 5 % — AB (ref 12–46)
Lymphocytes Relative: 17 % (ref 12–46)
Lymphs Abs: 3.3 10*3/uL (ref 0.7–4.0)
Lymphs Abs: 0.7 10*3/uL (ref 0.7–4.0)
MCH: 27.1 pg (ref 26.0–34.0)
MCH: 26.8 pg (ref 26.0–34.0)
MCHC: 31.2 g/dL (ref 30.0–36.0)
MCHC: 31 g/dL (ref 30.0–36.0)
MCV: 86.8 fL (ref 78.0–100.0)
MCV: 86.4 fL (ref 78.0–100.0)
MONOS PCT: 5 % (ref 3–12)
Monocytes Absolute: 1 10*3/uL (ref 0.1–1.0)
Monocytes Absolute: 0.6 10*3/uL (ref 0.1–1.0)
Monocytes Relative: 4 % (ref 3–12)
NEUTROS ABS: 11.9 10*3/uL — AB (ref 1.7–7.7)
NEUTROS PCT: 90 % — AB (ref 43–77)
Neutro Abs: 15.2 10*3/uL — ABNORMAL HIGH (ref 1.7–7.7)
Neutrophils Relative %: 77 % (ref 43–77)
Platelets: 196 10*3/uL (ref 150–400)
Platelets: 136 10*3/uL — ABNORMAL LOW (ref 150–400)
RBC: 4.32 MIL/uL (ref 3.87–5.11)
RBC: 3.81 MIL/uL — AB (ref 3.87–5.11)
RDW: 18.5 % — ABNORMAL HIGH (ref 11.5–15.5)
RDW: 18.2 % — ABNORMAL HIGH (ref 11.5–15.5)
WBC: 19.7 10*3/uL — ABNORMAL HIGH (ref 4.0–10.5)
WBC: 13.2 10*3/uL — AB (ref 4.0–10.5)

## 2014-09-04 LAB — RENAL FUNCTION PANEL
ALBUMIN: 3.1 g/dL — AB (ref 3.5–5.0)
Anion gap: 21 — ABNORMAL HIGH (ref 5–15)
BUN: 58 mg/dL — ABNORMAL HIGH (ref 6–20)
CALCIUM: 9.4 mg/dL (ref 8.9–10.3)
CO2: 19 mmol/L — ABNORMAL LOW (ref 22–32)
Chloride: 104 mmol/L (ref 101–111)
Creatinine, Ser: 11.18 mg/dL — ABNORMAL HIGH (ref 0.44–1.00)
GFR, EST AFRICAN AMERICAN: 3 mL/min — AB (ref >60)
GFR, EST NON AFRICAN AMERICAN: 3 mL/min — AB (ref >60)
GLUCOSE: 91 mg/dL (ref 65–99)
PHOSPHORUS: 5.6 mg/dL — AB (ref 2.5–4.6)
POTASSIUM: 5 mmol/L (ref 3.5–5.1)
SODIUM: 144 mmol/L (ref 135–145)

## 2014-09-04 LAB — GLUCOSE, CAPILLARY: Glucose-Capillary: 129 mg/dL — ABNORMAL HIGH (ref 65–99)

## 2014-09-04 LAB — BRAIN NATRIURETIC PEPTIDE: B Natriuretic Peptide: 3003.5 pg/mL — ABNORMAL HIGH (ref 0.0–100.0)

## 2014-09-04 LAB — LACTIC ACID, PLASMA
Lactic Acid, Venous: 5.1 mmol/L (ref 0.5–2.0)
Lactic Acid, Venous: 6.1 mmol/L (ref 0.5–2.0)
Lactic Acid, Venous: 4.8 mmol/L (ref 0.5–2.0)

## 2014-09-04 LAB — INFLUENZA PANEL BY PCR (TYPE A & B)
H1N1 flu by pcr: NOT DETECTED
Influenza A By PCR: NEGATIVE
Influenza B By PCR: NEGATIVE

## 2014-09-04 LAB — I-STAT TROPONIN, ED: Troponin i, poc: 0.46 ng/mL (ref 0.00–0.08)

## 2014-09-04 LAB — CORTISOL: Cortisol, Plasma: 25.5 ug/dL

## 2014-09-04 MED ORDER — CHLORHEXIDINE GLUCONATE 0.12 % MT SOLN
15.0000 mL | Freq: Two times a day (BID) | OROMUCOSAL | Status: DC
  Administered 2014-09-04 – 2014-09-08 (×8): 15 mL via OROMUCOSAL
  Filled 2014-09-04 (×8): qty 15

## 2014-09-04 MED ORDER — PIPERACILLIN-TAZOBACTAM IN DEX 2-0.25 GM/50ML IV SOLN
2.2500 g | Freq: Four times a day (QID) | INTRAVENOUS | Status: DC
  Filled 2014-09-04 (×2): qty 50

## 2014-09-04 MED ORDER — PRISMASOL BGK 4/2.5 32-4-2.5 MEQ/L IV SOLN
INTRAVENOUS | Status: DC
  Administered 2014-09-04 – 2014-09-05 (×3): via INTRAVENOUS_CENTRAL
  Filled 2014-09-04 (×4): qty 5000

## 2014-09-04 MED ORDER — SODIUM CHLORIDE 0.9 % IV SOLN: 100.0000 mL | INTRAVENOUS | Status: DC | PRN

## 2014-09-04 MED ORDER — PIPERACILLIN-TAZOBACTAM IN DEX 2-0.25 GM/50ML IV SOLN
2.2500 g | Freq: Three times a day (TID) | INTRAVENOUS | Status: DC
Start: 2014-09-04 — End: 2014-09-04
  Filled 2014-09-04 (×2): qty 50

## 2014-09-04 MED ORDER — NITROGLYCERIN IN D5W 200-5 MCG/ML-% IV SOLN
5.0000 ug/min | Freq: Once | INTRAVENOUS | Status: AC
  Administered 2014-09-04: 50 ug/min via INTRAVENOUS
  Filled 2014-09-04: qty 250

## 2014-09-04 MED ORDER — FENTANYL CITRATE (PF) 100 MCG/2ML IJ SOLN
100.0000 ug | INTRAMUSCULAR | Status: DC | PRN
  Administered 2014-09-04 – 2014-09-05 (×5): 100 ug via INTRAVENOUS
  Filled 2014-09-04 (×6): qty 2

## 2014-09-04 MED ORDER — SODIUM CHLORIDE 0.9 % IV SOLN
1.0000 g | Freq: Once | INTRAVENOUS | Status: DC
  Filled 2014-09-04: qty 10

## 2014-09-04 MED ORDER — SODIUM BICARBONATE 8.4 % IV SOLN
50.0000 meq | Freq: Once | INTRAVENOUS | Status: AC
  Administered 2014-09-04: 50 meq via INTRAVENOUS
  Filled 2014-09-04: qty 50

## 2014-09-04 MED ORDER — HEPARIN SODIUM (PORCINE) 1000 UNIT/ML DIALYSIS: 1000.0000 [IU] | INTRAMUSCULAR | Status: DC | PRN

## 2014-09-04 MED ORDER — HEPARIN SODIUM (PORCINE) 5000 UNIT/ML IJ SOLN
5000.0000 [IU] | Freq: Three times a day (TID) | INTRAMUSCULAR | Status: DC
  Administered 2014-09-04 – 2014-09-07 (×8): 5000 [IU] via SUBCUTANEOUS
  Filled 2014-09-04 (×11): qty 1

## 2014-09-04 MED ORDER — HEPARIN (PORCINE) 2000 UNITS/L FOR CRRT
INTRAVENOUS_CENTRAL | Status: DC | PRN
  Filled 2014-09-04: qty 1000

## 2014-09-04 MED ORDER — CALCIUM GLUCONATE 10 % IV SOLN
1.0000 g | Freq: Once | INTRAVENOUS | Status: AC
  Administered 2014-09-04: 1 g via INTRAVENOUS
  Filled 2014-09-04 (×2): qty 10

## 2014-09-04 MED ORDER — ALBUTEROL (5 MG/ML) CONTINUOUS INHALATION SOLN
10.0000 mg/h | INHALATION_SOLUTION | Freq: Once | RESPIRATORY_TRACT | Status: AC
  Administered 2014-09-04: 10 mg/h via RESPIRATORY_TRACT
  Filled 2014-09-04: qty 20

## 2014-09-04 MED ORDER — HEPARIN BOLUS VIA INFUSION (CRRT)
1000.0000 [IU] | INTRAVENOUS | Status: DC | PRN
  Administered 2014-09-04 (×2): 1000 [IU] via INTRAVENOUS_CENTRAL
  Filled 2014-09-04 (×3): qty 1000

## 2014-09-04 MED ORDER — CETYLPYRIDINIUM CHLORIDE 0.05 % MT LIQD
7.0000 mL | Freq: Four times a day (QID) | OROMUCOSAL | Status: DC
  Administered 2014-09-05 – 2014-09-08 (×16): 7 mL via OROMUCOSAL

## 2014-09-04 MED ORDER — PENTAFLUOROPROP-TETRAFLUOROETH EX AERO: 1.0000 "application " | INHALATION_SPRAY | CUTANEOUS | Status: DC | PRN

## 2014-09-04 MED ORDER — INSULIN ASPART 100 UNIT/ML ~~LOC~~ SOLN
10.0000 [IU] | Freq: Once | SUBCUTANEOUS | Status: AC
  Administered 2014-09-04: 10 [IU] via INTRAVENOUS
  Filled 2014-09-04: qty 1

## 2014-09-04 MED ORDER — PIPERACILLIN-TAZOBACTAM IN DEX 2-0.25 GM/50ML IV SOLN
2.2500 g | Freq: Once | INTRAVENOUS | Status: AC
  Administered 2014-09-04: 2.25 g via INTRAVENOUS
  Filled 2014-09-04: qty 50

## 2014-09-04 MED ORDER — VANCOMYCIN HCL 10 G IV SOLR
1250.0000 mg | Freq: Once | INTRAVENOUS | Status: AC
  Administered 2014-09-04: 1250 mg via INTRAVENOUS
  Filled 2014-09-04 (×3): qty 1250

## 2014-09-04 MED ORDER — VANCOMYCIN HCL IN DEXTROSE 1-5 GM/200ML-% IV SOLN: 1000.0000 mg | Freq: Once | INTRAVENOUS | Status: DC

## 2014-09-04 MED ORDER — VECURONIUM BROMIDE 10 MG IV SOLR
INTRAVENOUS | Status: DC | PRN
  Administered 2014-09-04: 100 mg via INTRAVENOUS

## 2014-09-04 MED ORDER — HEPARIN SODIUM (PORCINE) 1000 UNIT/ML DIALYSIS
1000.0000 [IU] | INTRAMUSCULAR | Status: DC | PRN
  Administered 2014-09-06 (×2): 1000 [IU] via INTRAVENOUS_CENTRAL
  Filled 2014-09-04 (×4): qty 1

## 2014-09-04 MED ORDER — PRISMASOL BGK 4/2.5 32-4-2.5 MEQ/L IV SOLN
INTRAVENOUS | Status: DC
  Administered 2014-09-04 – 2014-09-06 (×12): via INTRAVENOUS_CENTRAL
  Filled 2014-09-04 (×17): qty 5000

## 2014-09-04 MED ORDER — ETOMIDATE 2 MG/ML IV SOLN
INTRAVENOUS | Status: DC | PRN
  Administered 2014-09-04: 20 mg via INTRAVENOUS

## 2014-09-04 MED ORDER — DEXTROSE 50 % IV SOLN
1.0000 | Freq: Once | INTRAVENOUS | Status: AC
Start: 2014-09-04 — End: 2014-09-04
  Administered 2014-09-04: 50 mL via INTRAVENOUS
  Filled 2014-09-04: qty 50

## 2014-09-04 MED ORDER — PIPERACILLIN-TAZOBACTAM 3.375 G IVPB 30 MIN
3.3750 g | Freq: Four times a day (QID) | INTRAVENOUS | Status: DC
  Administered 2014-09-04 – 2014-09-06 (×7): 3.375 g via INTRAVENOUS
  Filled 2014-09-04 (×11): qty 50

## 2014-09-04 MED ORDER — NEPRO/CARBSTEADY PO LIQD
237.0000 mL | ORAL | Status: DC | PRN
  Filled 2014-09-04: qty 237

## 2014-09-04 MED ORDER — DEXMEDETOMIDINE HCL IN NACL 200 MCG/50ML IV SOLN
0.4000 ug/kg/h | INTRAVENOUS | Status: DC
  Administered 2014-09-04: 0.4 ug/kg/h via INTRAVENOUS
  Administered 2014-09-04: 0.8 ug/kg/h via INTRAVENOUS
  Filled 2014-09-04 (×2): qty 50

## 2014-09-04 MED ORDER — VANCOMYCIN HCL 500 MG IV SOLR
500.0000 mg | INTRAVENOUS | Status: DC
  Administered 2014-09-05: 500 mg via INTRAVENOUS
  Filled 2014-09-04 (×2): qty 500

## 2014-09-04 MED ORDER — DEXMEDETOMIDINE HCL IN NACL 400 MCG/100ML IV SOLN
0.4000 ug/kg/h | INTRAVENOUS | Status: DC
Start: 2014-09-04 — End: 2014-09-08
  Administered 2014-09-04 – 2014-09-05 (×2): 1 ug/kg/h via INTRAVENOUS
  Administered 2014-09-05 – 2014-09-06 (×7): 1.2 ug/kg/h via INTRAVENOUS
  Administered 2014-09-07: 0.8 ug/kg/h via INTRAVENOUS
  Administered 2014-09-07: 1.2 ug/kg/h via INTRAVENOUS
  Administered 2014-09-07 – 2014-09-08 (×2): 0.8 ug/kg/h via INTRAVENOUS
  Filled 2014-09-04 (×13): qty 100

## 2014-09-04 MED ORDER — PRISMASOL BGK 4/2.5 32-4-2.5 MEQ/L IV SOLN
INTRAVENOUS | Status: DC
  Administered 2014-09-04 – 2014-09-06 (×2): via INTRAVENOUS_CENTRAL
  Filled 2014-09-04 (×4): qty 5000

## 2014-09-04 MED ORDER — ALTEPLASE 2 MG IJ SOLR: 2.0000 mg | Freq: Once | INTRAMUSCULAR | Status: AC | PRN

## 2014-09-04 MED ORDER — SODIUM CHLORIDE 0.9 % IJ SOLN
250.0000 [IU]/h | INTRAMUSCULAR | Status: DC
  Administered 2014-09-04: 250 [IU]/h via INTRAVENOUS_CENTRAL
  Administered 2014-09-05 (×2): 1150 [IU]/h via INTRAVENOUS_CENTRAL
  Administered 2014-09-06: 650 [IU]/h via INTRAVENOUS_CENTRAL
  Filled 2014-09-04 (×6): qty 2

## 2014-09-04 MED ORDER — DEXTROSE 5 % IV SOLN
2.0000 ug/min | INTRAVENOUS | Status: DC
  Administered 2014-09-04: 2 ug/min via INTRAVENOUS
  Filled 2014-09-04 (×2): qty 4

## 2014-09-04 MED ORDER — LIDOCAINE HCL (PF) 1 % IJ SOLN: 5.0000 mL | INTRAMUSCULAR | Status: DC | PRN

## 2014-09-04 MED ORDER — ONDANSETRON HCL 4 MG/2ML IJ SOLN: 4.0000 mg | Freq: Three times a day (TID) | INTRAMUSCULAR | Status: AC | PRN

## 2014-09-04 MED ORDER — LIDOCAINE-PRILOCAINE 2.5-2.5 % EX CREA: 1.0000 "application " | TOPICAL_CREAM | CUTANEOUS | Status: DC | PRN

## 2014-09-04 MED ORDER — SODIUM CHLORIDE 0.9 % IV SOLN
INTRAVENOUS | Status: DC | PRN
  Administered 2014-09-04: 1000 mL via INTRAVENOUS

## 2014-09-04 MED ORDER — FAMOTIDINE IN NACL 20-0.9 MG/50ML-% IV SOLN
20.0000 mg | Freq: Every day | INTRAVENOUS | Status: DC
  Administered 2014-09-04 – 2014-09-08 (×5): 20 mg via INTRAVENOUS
  Filled 2014-09-04 (×5): qty 50

## 2014-09-04 NOTE — Progress Notes (Signed)
CRITICAL VALUE ALERT  Critical value received:  Lactic Acid 4.8   Date of notification:  Sep 05, 2014  Time of notification:  2110  Critical value read back: yes  Nurse who received alert:  A. Lamar Benes RN  MD notified (1st page): Left message with Lowella Bandy RN at [facility] for Dr. Kendrick Fries

## 2014-09-04 NOTE — Progress Notes (Signed)
Patient intubated by ED physician due to respiratory distress, good color change on ETCO2 detector, good BBS, SATS 100%, placed on above settings MD aware.

## 2014-09-04 NOTE — ED Notes (Signed)
Critical Care at bedside.  

## 2014-09-04 NOTE — Progress Notes (Signed)
Dr Kendrick Fries notified that HD cath insertion site (on left chest) is a little red. Received permission to proceed with CRRT therapy using the HD cath.

## 2014-09-04 NOTE — Progress Notes (Signed)
CRITICAL VALUE ALERT  Critical value received: Lactic acid 5.1  Date of notification: 2014-10-02  Time of notification: 1358  Critical value read back: yes  Nurse who received alert: Lindajo Royal, RN  MD notified (1st page): Dr Molli Knock  Time of first page: 1415  MD notified (2nd page):   Time of second page:  Responding MD: Dr Molli Knock  Time MD responded: [phone]

## 2014-09-04 NOTE — ED Notes (Signed)
Family Member   Cam Broadnax (neice)  [phone]

## 2014-09-04 NOTE — Progress Notes (Signed)
eLink Physician-Brief Progress Note Patient Name: Alexandra [name] DOB: [birth year] MRN: [id number]   Date of Service  09/08/2014  HPI/Events of Note  agitation  eICU Interventions  restraints     Intervention Category Minor Interventions: Agitation / anxiety - evaluation and management  Rashell Shambaugh 09/07/2014, 4:34 PM

## 2014-09-04 NOTE — ED Provider Notes (Signed)
CSN: [id number]     Arrival date & time 09-13-14  1042 History   First MD Initiated Contact with Patient 09-13-14 1045     Chief Complaint  Patient presents with  . Shortness of Breath     (Consider location/radiation/quality/duration/timing/severity/associated sxs/prior Treatment) HPI Comments: The patient is a 69 year old female, she does have a history of end-stage renal disease on dialysis, I do not have any other medical information on her at this time as she is unable to give me any information, she was in respiratory distress when the paramedics found her this morning, she was able to tell them that has been going on for a couple of days but has very little other details. She is able to shake her head yes or no to answer questions, denies missing dialysis, no other information is available. The paramedics report no abnormal significant vital signs other than mild hypoxia requiring some oxygen, the patient is not she has to swelling of the legs which is chronic, no other information available.  Patient is a 69 y.o. female presenting with shortness of breath. The history is provided by the patient.  Shortness of Breath   Past Medical History  Diagnosis Date  . Stroke   . Renal disorder    History reviewed. No pertinent past surgical history. History reviewed. No pertinent family history. History  Substance Use Topics  . Smoking status: Unknown If Ever Smoked  . Smokeless tobacco: Not on file  . Alcohol Use: Not on file   OB History    No data available     Review of Systems  Unable to perform ROS: Mental status change  Respiratory: Positive for shortness of breath.       Allergies  Review of patient's allergies indicates not on file.  Home Medications   Prior to Admission medications   Not on File   BP 140/78 mmHg  Pulse 106  Resp 18  SpO2 100% Physical Exam  Constitutional: She appears well-developed and well-nourished. She appears distressed.  HENT:   Head: Normocephalic and atraumatic.  Mouth/Throat: Oropharynx is clear and moist. No oropharyngeal exudate.  Eyes: Conjunctivae and EOM are normal. Pupils are equal, round, and reactive to light. Right eye exhibits no discharge. Left eye exhibits no discharge. No scleral icterus.  Neck: Normal range of motion. Neck supple. No JVD present. No thyromegaly present.  Cardiovascular: Normal rate, regular rhythm, normal heart sounds and intact distal pulses.  Exam reveals no gallop and no friction rub.   No murmur heard. Pulmonary/Chest: Breath sounds normal. She is in respiratory distress. She has no wheezes. She has no rales.  Tachypnea present, using accessory muscles of the neck  Abdominal: Soft. Bowel sounds are normal. She exhibits no distension and no mass. There is no tenderness.  Musculoskeletal: Normal range of motion. She exhibits edema (bilateral lower extremity edema at the ankles). She exhibits no tenderness.  Lymphadenopathy:    She has no cervical adenopathy.  Neurological: She is alert. Coordination normal.  Altered mental status, significant confusion, unable to open her eyes well, extremely weak, tilted to the side  Skin: Skin is warm and dry. No rash noted. No erythema.  Psychiatric: She has a normal mood and affect. Her behavior is normal.  Nursing note and vitals reviewed.   ED Course  CENTRAL LINE Date/Time: 09-13-2014 12:17 PM Performed by: Eber Hong Authorized by: Eber Hong Consent: The procedure was performed in an emergent situation. Required items: required blood products, implants, devices, and special  equipment available Time out: Immediately prior to procedure a "time out" was called to verify the correct patient, procedure, equipment, support staff and site/side marked as required. Indications: vascular access Preparation: skin prepped with 2% chlorhexidine Skin prep agent dried: skin prep agent completely dried prior to procedure Sterile barriers: all  five maximum sterile barriers used - cap, mask, sterile gown, sterile gloves, and large sterile sheet Hand hygiene: hand hygiene performed prior to central venous catheter insertion Location details: right femoral Patient position: flat Catheter type: triple lumen Pre-procedure: landmarks identified Ultrasound guidance: yes Sterile ultrasound techniques: sterile gel and sterile probe covers were used Number of attempts: 2 Successful placement: yes Post-procedure: line sutured and dressing applied Assessment: blood return through all ports and free fluid flow Patient tolerance: Patient tolerated the procedure well with no immediate complications   (including critical care time) Labs Review Labs Reviewed  CBC WITH DIFFERENTIAL/PLATELET - Abnormal; Notable for the following:    WBC 19.7 (*)    Hemoglobin 11.7 (*)    RDW 18.5 (*)    Neutro Abs 15.2 (*)    All other components within normal limits  I-STAT CHEM 8, ED - Abnormal; Notable for the following:    Sodium 134 (*)    Potassium 7.6 (*)    BUN 84 (*)    Creatinine, Ser 11.50 (*)    Glucose, Bld 147 (*)    Calcium, Ion 1.07 (*)    All other components within normal limits  I-STAT TROPOININ, ED - Abnormal; Notable for the following:    Troponin i, poc 0.46 (*)    All other components within normal limits  COMPREHENSIVE METABOLIC PANEL    Imaging Review Dg Chest Portable 1 View  09/02/2014   CLINICAL DATA:  Shortness of breath. Central line insertion. Check support apparatus  EXAM: PORTABLE CHEST - 1 VIEW  COMPARISON:  09/08/2014  FINDINGS: Endotracheal tube tip is 3 cm above the carina. Left dialysis catheter is unchanged. NG tube enters the stomach.  Hyperinflation of the lungs. Heart is normal size. Vascular congestion. Diffuse interstitial prominence throughout the lungs, increasing since prior study. No effusions or acute bony abnormality.  IMPRESSION: Increasing interstitial prominence throughout the lungs could reflect  interstitial edema.  Endotracheal tube 3 cm above the carina.   Electronically Signed   By: Charlett Nose M.D.   On: 08/30/2014 12:09   Dg Chest Port 1 View  08/24/2014   CLINICAL DATA:  Initial encounter for shortness of breath and chest pain.  EXAM: PORTABLE CHEST - 1 VIEW  COMPARISON:  None.  FINDINGS: 1055 hrs. Left IJ dialysis catheter tip projects in the mid right atrium. Interstitial markings are diffusely coarsened with chronic features. Mild vascular congestion without overt airspace pulmonary edema. There is bibasilar atelectasis. The cardio pericardial silhouette is enlarged. Telemetry leads overlie the chest. Vascular stent device noted in the right axillary region.  IMPRESSION: Vascular congestion without overt airspace pulmonary edema.   Electronically Signed   By: Kennith Center M.D.   On: 09/03/2014 11:43     EKG Interpretation   Date/Time:  Wednesday September 04 2014 10:53:15 EDT Ventricular Rate:  101 PR Interval:  169 QRS Duration: 110 QT Interval:  350 QTC Calculation: 454 R Axis:   58 Text Interpretation:  Sinus tachycardia Biatrial enlargement LVH with  secondary repolarization abnormality ST depr, consider ischemia, inferior  leads Anterior ST elevation, probably due to LVH diffuse ST abnormality -  not seen on prior Abnormal ekg Confirmed by  Prashant Glosser  MD, Nitza Schmid ([id number]) on  08/30/2014 11:04:14 AM      MDM   Final diagnoses:  SOB (shortness of breath)  Hyperkalemia  Severe hypertension  Acute pulmonary edema    The patient is unable to give me an adequate story, a level V caveat apply secondary to this, she does appear to be in respiratory distress, she was unable to ambulate to the stretcher at the house, she will need a workup for both pulmonary edema as well as the source of altered mental status anticipate admission..    The patient appears critically ill, she is to keep it, diaphoretic, pale and severely hypertensive. She'll be started on a nitroglycerin drip to  reduce preload increase cardiac contractility in cardiac output. She may need BiPAP or intubation if she continues to have distress but at this time she is able to speak in 1-2 word sentences, oxygenating with supplemental oxygen.  The patient continued to have severe respiratory distress, it continued to get worse, blood pressure continued to rise, no IV access was obtained by the nurses due to severe difficulty with small veins, I personally attempted peripheral IV access 3 times including external jugular and the neck, successfully placed a 22-gauge in the right external jugular vein, the patient was moved to a resuscitation room and medications for intubation were obtained and given, the patient was informed of the need for intubation and was agreeable prior to this procedure. Central line placed in the right femoral vein after intubation successfully, see procedure note.  Discussed with intensive care physician Dr. Molli Knock who has accepted care to the intensive care unit. The patient is critically ill. Additionally the patient was hyperkalemic, medications were given including calcium, bicarbonate, insulin, glucose, albuterol. Continuous cardiac monitoring, grossly abnormal EKG was discussed with the cardiologist who agreed that this was most likely metabolic though the patient does have an elevated troponin at 0.5, likely some ischemia related to severe hypertension and hyperkalemia.  CRITICAL CARE Performed by: Vida Roller Total critical care time: 35 Critical care time was exclusive of separately billable procedures and treating other patients. Critical care was necessary to treat or prevent imminent or life-threatening deterioration. Critical care was time spent personally by me on the following activities: development of treatment plan with patient and/or surrogate as well as nursing, discussions with consultants, evaluation of patient's response to treatment, examination of patient, obtaining  history from patient or surrogate, ordering and performing treatments and interventions, ordering and review of laboratory studies, ordering and review of radiographic studies, pulse oximetry and re-evaluation of patient's condition.   INTUBATION Performed by: Vida Roller  Required items: required blood products, implants, devices, and special equipment available Patient identity confirmed: provided demographic data and hospital-assigned identification number Time out: Immediately prior to procedure a "time out" was called to verify the correct patient, procedure, equipment, support staff and site/side marked as required.  Indications: pulmonary edema, resp distress  Intubation method: direct Laryngoscopy   Preoxygenation: BVM  Sedatives:  Etomidate Paralytic: Rocuronium    Tube Size: 7.5 cuffed  Post-procedure assessment: chest rise and ETCO2 monitor Breath sounds: equal and absent over the epigastrium Tube secured with: ETT holder Chest x-ray interpreted by radiologist and me.  Chest x-ray findings: endotracheal tube in appropriate position  Patient tolerated the procedure well with no immediate complications.     Eber Hong, MD 09/05/2014 1218

## 2014-09-04 NOTE — Progress Notes (Signed)
ELink called to inform Dr Kendrick Fries of pt's sudden drop in blood pressure from 150's systolic to 70's, and pt's hypothermia with oral temp of 94. Orders received for Levophed gtt to be started. Another Lactic Acid also ordered. Will continue to monitor.

## 2014-09-04 NOTE — ED Notes (Addendum)
Next of kin is name listed in pt demographics, Alexandra [name], per RN at [facility].

## 2014-09-04 NOTE — Code Documentation (Signed)
EDP placing central line at this time.

## 2014-09-04 NOTE — Progress Notes (Signed)
CRITICAL VALUE ALERT  Critical value received: Lactic Acid  Date of notification: 08/16/2014  Time of notification: 1630  Critical value read back: yes  Nurse who received alert: Lindajo Royal, RN  MD notified (1st page): Dr Kendrick Fries  Time of first page:1632  MD notified (2nd page):  Time of second page:  Responding MD: Kendrick Fries  Time MD responded: [phone]

## 2014-09-04 NOTE — ED Notes (Signed)
2 RN's have attempted IV access unsuccessfully, will have MD attempt.
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
## 2014-09-04 NOTE — Progress Notes (Addendum)
ANTIBIOTIC CONSULT NOTE - INITIAL  Pharmacy Consult for Vancomycin and Zosyn Indication: Bacteremia  Allergies not on file  Patient Measurements: Height: 6' (182.9 cm) Weight: 150 lb (68.04 kg) IBW/kg (Calculated) : 73.1 Adjusted Body Weight:   Vital Signs: Temp: 94.8 F (34.9 C) (06/22 1214) Temp Source: Rectal (06/22 1214) BP: 169/75 mmHg (06/22 1215) Pulse Rate: 112 (06/22 1215) Intake/Output from previous day:   Intake/Output from this shift:    Labs:  Recent Labs  09/10/2014 1130 09/06/2014 1141  WBC 19.7*  --   HGB 11.7* 14.3  PLT 196  --   CREATININE 11.11* 11.50*   Estimated Creatinine Clearance: 4.8 mL/min (by C-G formula based on Cr of 11.5). No results for input(s): VANCOTROUGH, VANCOPEAK, VANCORANDOM, GENTTROUGH, GENTPEAK, GENTRANDOM, TOBRATROUGH, TOBRAPEAK, TOBRARND, AMIKACINPEAK, AMIKACINTROU, AMIKACIN in the last 72 hours.   Microbiology: No results found for this or any previous visit (from the past 720 hour(s)).  Medical History: Past Medical History  Diagnosis Date  . Stroke   . Renal disorder     Medications:  Scheduled:   Assessment: 69yo female ESRD-MWF HD who presents to ED with SOB prior to today's dialysis.  She has since required intubation d/t progressive respiratory failure.  She has a L-IJ tunneled HD cath which appears inflamed.  WBC 19.7.  Blood, Urine, and Sputum cultures have been collected.  She has received no antibiotic doses  RN called HD ctr and documented drug allergies.  Dry wt is 57kg  Goal of Therapy:  pre-HD level 15-25  Plan:  Zosyn 2.25g IV q8 Vancomycin 1000mg  IV x 1, then 500mg  IV qHD F/U cultures & clinical status  Marisue Humble, PharmD Clinical Pharmacist [facility]  ___________________________________________________ Pt is to be initiated on CRRT this afternoon. WBC 13.2,. Pt hypothermic at 94.4. Lactate 5.1 Initiation of CRRT. Probable line-associated sepsis. Antibiotic  adjustment needed.  Plan: -Vanc 1250 mg x 1 followed by 500 mg q24h -Increase Zosyn to 3.375 gm q6h -F/u clinical status, tolerability of CRRT, culture results -VT at ss

## 2014-09-04 NOTE — ED Notes (Signed)
Spoke to Press photographer at [employer] to obtain medical history and allergies.

## 2014-09-04 NOTE — Progress Notes (Signed)
eLink Physician-Brief Progress Note Patient Name: Alexandra [name] DOB: 1945/04/21 MRN: [id number]   Date of Service  09/10/2014  HPI/Events of Note  Lactic acid clearing slowly  eICU Interventions  Repeat lactic acid in  AM     Intervention Category Minor Interventions: Routine modifications to care plan (e.g. PRN medications for pain, fever)  Margie Urbanowicz 08/30/2014, 9:57 PM

## 2014-09-04 NOTE — ED Notes (Signed)
Attempted to place temp foley per verbal order from EDP. No urine return noted. Unsure of pt urinary status due to hemodialysis. No notes on file as far as that concern. Foley removed at this time.

## 2014-09-04 NOTE — Progress Notes (Signed)
eLink Physician-Brief Progress Note Patient Name: CHRYEL STINSON DOB: 03-May-1945 MRN: [id number]   Date of Service  09/21/2014  HPI/Events of Note    eICU Interventions  Add dvt and SUP prophylaxis with heparin and pepcid     Intervention Category Major Interventions: Sepsis - evaluation and management Intermediate Interventions: Best-practice therapies (e.g. DVT, beta blocker, etc.)  MCQUAID, DOUGLAS 09-21-2014, 3:22 PM

## 2014-09-04 NOTE — ED Notes (Signed)
Attempted report 

## 2014-09-04 NOTE — Consult Note (Signed)
Reason for Consult: To manage dialysis and dialysis related needs Referring Physician: Corissa Oguinn is an 69 y.o. female with past medical history significant for hypertension, diffuse vascular disease including cerebrovascular disease, gout as well as end-stage renal disease of unknown etiology. Patient previously received hemodialysis through the [place] [facility]. However, behavioral issues blood to her being discharged from the practice. I believe she previously dialyzed in [facility] but now dialyzes at the [facility] center in [place] on Mondays Wednesdays and Fridays. According to the chart, patient came in short of breath today. She required intubation for the shortness of breath- chest x-ray likely consistent with pulmonary edema. Patient also also noted to be  hyperkalemic- initially was 6.2 and then on recheck was 7.6. She's been given the acute medical therapy for hyperkalemia and we are asked to provide emergent dialysis. Also of note, patient has a left sided PermCath that appears to be red. This could be cause for concern- white blood count 19,000-  blood cultures are been obtained. No history is obtainable from the patient at this time   Dialyzes at [facility] - no other information available. She dialyzes with a PermCath   Past Medical History  Diagnosis Date  . Stroke   . Renal disorder   . CHF (congestive heart failure)   . PVD (peripheral vascular disease)   . COPD (chronic obstructive pulmonary disease)   . Hypertension     History reviewed. No pertinent past surgical history.  History reviewed. No pertinent family history.  Social History:  reports that she has quit smoking. She does not have any smokeless tobacco history on file. Her alcohol and drug histories are not on file.  Allergies:  Allergies  Allergen Reactions  . Codeine   . Dextrans   . Epinephrine   . Iron   . Niacin And Related   . Nifedipine   . Percocet  [Oxycodone-Acetaminophen]   . Statins     Medications: I have reviewed the patient's current medications.  Results for orders placed or performed during the hospital encounter of 09/01/2014 (from the past 48 hour(s))  CBC with Differential/Platelet     Status: Abnormal   Collection Time: 09/08/2014 11:30 AM  Result Value Ref Range   WBC 19.7 (H) 4.0 - 10.5 K/uL   RBC 4.32 3.87 - 5.11 MIL/uL   Hemoglobin 11.7 (L) 12.0 - 15.0 g/dL   HCT 37.5 36.0 - 46.0 %   MCV 86.8 78.0 - 100.0 fL   MCH 27.1 26.0 - 34.0 pg   MCHC 31.2 30.0 - 36.0 g/dL   RDW 18.5 (H) 11.5 - 15.5 %   Platelets 196 150 - 400 K/uL   Neutrophils Relative % 77 43 - 77 %   Neutro Abs 15.2 (H) 1.7 - 7.7 K/uL   Lymphocytes Relative 17 12 - 46 %   Lymphs Abs 3.3 0.7 - 4.0 K/uL   Monocytes Relative 5 3 - 12 %   Monocytes Absolute 1.0 0.1 - 1.0 K/uL   Eosinophils Relative 1 0 - 5 %   Eosinophils Absolute 0.2 0.0 - 0.7 K/uL   Basophils Relative 0 0 - 1 %   Basophils Absolute 0.1 0.0 - 0.1 K/uL  Comprehensive metabolic panel     Status: Abnormal   Collection Time: 08/24/2014 11:30 AM  Result Value Ref Range   Sodium 140 135 - 145 mmol/L   Potassium 6.2 (HH) 3.5 - 5.1 mmol/L    Comment: SLIGHT HEMOLYSIS  REPEATED TO VERIFY CRITICAL RESULT CALLED TO, READ BACK BY AND VERIFIED WITH: B.MILLER,RN 1224 08/15/2014 CLARK,S    Chloride 102 101 - 111 mmol/L   CO2 17 (L) 22 - 32 mmol/L   Glucose, Bld 148 (H) 65 - 99 mg/dL   BUN 57 (H) 6 - 20 mg/dL   Creatinine, Ser 11.11 (H) 0.44 - 1.00 mg/dL   Calcium 9.7 8.9 - 10.3 mg/dL   Total Protein 7.9 6.5 - 8.1 g/dL   Albumin 3.9 3.5 - 5.0 g/dL   AST 37 15 - 41 U/L   ALT 13 (L) 14 - 54 U/L   Alkaline Phosphatase 114 38 - 126 U/L   Total Bilirubin 0.9 0.3 - 1.2 mg/dL   GFR calc non Af Amer 3 (L) >60 mL/min   GFR calc Af Amer 3 (L) >60 mL/min    Comment: (NOTE) The eGFR has been calculated using the CKD EPI equation. This calculation has not been validated in all clinical situations. eGFR's  persistently <60 mL/min signify possible Chronic Kidney Disease.    Anion gap 21 (H) 5 - 15  I-stat troponin, ED     Status: Abnormal   Collection Time: 09/03/2014 11:39 AM  Result Value Ref Range   Troponin i, poc 0.46 (HH) 0.00 - 0.08 ng/mL   Comment NOTIFIED PHYSICIAN    Comment 3            Comment: Due to the release kinetics of cTnI, a negative result within the first hours of the onset of symptoms does not rule out myocardial infarction with certainty. If myocardial infarction is still suspected, repeat the test at appropriate intervals.   I-stat chem 8, ed     Status: Abnormal   Collection Time: 08/14/2014 11:41 AM  Result Value Ref Range   Sodium 134 (L) 135 - 145 mmol/L   Potassium 7.6 (HH) 3.5 - 5.1 mmol/L   Chloride 109 101 - 111 mmol/L   BUN 84 (H) 6 - 20 mg/dL   Creatinine, Ser 11.50 (H) 0.44 - 1.00 mg/dL   Glucose, Bld 147 (H) 65 - 99 mg/dL   Calcium, Ion 1.07 (L) 1.13 - 1.30 mmol/L   TCO2 18 0 - 100 mmol/L   Hemoglobin 14.3 12.0 - 15.0 g/dL   HCT 42.0 36.0 - 46.0 %   Comment NOTIFIED PHYSICIAN   CBC with Differential/Platelet     Status: Abnormal   Collection Time: 08/22/2014 12:53 PM  Result Value Ref Range   WBC 13.2 (H) 4.0 - 10.5 K/uL   RBC 3.81 (L) 3.87 - 5.11 MIL/uL   Hemoglobin 10.2 (L) 12.0 - 15.0 g/dL    Comment: REPEATED TO VERIFY   HCT 32.9 (L) 36.0 - 46.0 %   MCV 86.4 78.0 - 100.0 fL   MCH 26.8 26.0 - 34.0 pg   MCHC 31.0 30.0 - 36.0 g/dL   RDW 18.2 (H) 11.5 - 15.5 %   Platelets 136 (L) 150 - 400 K/uL   Neutrophils Relative % 90 (H) 43 - 77 %   Neutro Abs 11.9 (H) 1.7 - 7.7 K/uL   Lymphocytes Relative 5 (L) 12 - 46 %   Lymphs Abs 0.7 0.7 - 4.0 K/uL   Monocytes Relative 4 3 - 12 %   Monocytes Absolute 0.6 0.1 - 1.0 K/uL   Eosinophils Relative 0 0 - 5 %   Eosinophils Absolute 0.0 0.0 - 0.7 K/uL   Basophils Relative 0 0 - 1 %   Basophils Absolute 0.0  0.0 - 0.1 K/uL  I-Stat arterial blood gas, ED     Status: Abnormal   Collection Time: 08/20/2014   1:08 PM  Result Value Ref Range   pH, Arterial 7.322 (L) 7.350 - 7.450   pCO2 arterial 35.7 35.0 - 45.0 mmHg   pO2, Arterial 182.0 (H) 80.0 - 100.0 mmHg   Bicarbonate 18.5 (L) 20.0 - 24.0 mEq/L   TCO2 20 0 - 100 mmol/L   O2 Saturation 100.0 %   Acid-base deficit 7.0 (H) 0.0 - 2.0 mmol/L   Patient temperature 98.6 F    Collection site RADIAL, ALLEN'S TEST ACCEPTABLE    Drawn by Operator    Sample type ARTERIAL     Dg Chest Portable 1 View  08/27/2014   CLINICAL DATA:  Shortness of breath. Central line insertion. Check support apparatus  EXAM: PORTABLE CHEST - 1 VIEW  COMPARISON:  08/27/2014  FINDINGS: Endotracheal tube tip is 3 cm above the carina. Left dialysis catheter is unchanged. NG tube enters the stomach.  Hyperinflation of the lungs. Heart is normal size. Vascular congestion. Diffuse interstitial prominence throughout the lungs, increasing since prior study. No effusions or acute bony abnormality.  IMPRESSION: Increasing interstitial prominence throughout the lungs could reflect interstitial edema.  Endotracheal tube 3 cm above the carina.   Electronically Signed   By: Rolm Baptise M.D.   On: 08/25/2014 12:09   Dg Chest Port 1 View  08/20/2014   CLINICAL DATA:  Initial encounter for shortness of breath and chest pain.  EXAM: PORTABLE CHEST - 1 VIEW  COMPARISON:  None.  FINDINGS: 1055 hrs. Left IJ dialysis catheter tip projects in the mid right atrium. Interstitial markings are diffusely coarsened with chronic features. Mild vascular congestion without overt airspace pulmonary edema. There is bibasilar atelectasis. The cardio pericardial silhouette is enlarged. Telemetry leads overlie the chest. Vascular stent device noted in the right axillary region.  IMPRESSION: Vascular congestion without overt airspace pulmonary edema.   Electronically Signed   By: Misty Stanley M.D.   On: 09/12/2014 11:43    ROS: Not able to obtain secondary patient being intubated Blood pressure 149/58, pulse 100,  temperature 94.8 F (34.9 C), temperature source Rectal, resp. rate 18, height 6' (1.829 m), weight 57.607 kg (127 lb), SpO2 100 %. General appearance: Sedated and intubated Resp: diminished breath sounds bibasilar Cardio: regular rate and rhythm, S1, S2 normal, no murmur, click, rub or gallop GI: soft, non-tender; bowel sounds normal; no masses,  no organomegaly Extremities: edema Trace Left-sided PermCath which is erythematous  Assessment/Plan: 69 year old black female with hypertension, hyperlipidemia and ESRD. She required intubation this morning for pulmonary edema. She also has hyperkalemia. Given the fact that I don't with her in the past I suspect that she's been noncompliant with her dialysis. There also may be a PermCath infection contributing to the clinical Picture  1 respiratory failure secondary to pulmonary edema - required intubation. We'll do emergent dialysis with volume removal as tolerated.   2 ESRD: Normally Monday Wednesday Friday at [place]. HD today with 0/1 potassium bath and then reassess daily for continued need 3 Hypertension: Likely due to volume. Should come down with ultrafiltration 4. Anemia of ESRD: Hemoglobin 10.2. Likely to go down. We'll follow 5. Metabolic Bone Disease: Will monitor calcium and phosphorus while here and start appropriate medications 6. Possible PermCath infection- blood cultures obtained.- Also started on Zosyn and then. Blood cultures are positive will need to have PermCath removed   Antavious Spanos A 09/08/2014, 1:28  PM    

## 2014-09-04 NOTE — Progress Notes (Signed)
ELink CCM MD called to request order for non-violent restraints, as patient is restless and pulling at tubes and lines. Bilateral soft wrist restraints applied for safety and explained to pt.

## 2014-09-04 NOTE — Progress Notes (Signed)
eLink Physician-Brief Progress Note Patient Name: Alexandra [name] DOB: August 23, 1945 MRN: [id number]   Date of Service  09/07/14  HPI/Events of Note  New arrival from floor Septic based on hypothermia, WBC, now hypotensive  eICU Interventions  Source: line infection? Await culture results prior to line removal Abx: vanc/zosyn Hemodynamics:  Filed Vitals:   09-07-2014 1315 2014/09/07 1345 2014/09/07 1400 09-07-14 1500  BP: 149/58 157/53 174/54 76/33  Pulse: 100   93  Temp:   94 F (34.4 C) 94.4 F (34.7 C)  TempSrc:   Oral Oral  Resp: 18 18 18 18   Height:      Weight:      SpO2: 100% 100%  95%      Vasopressors: start levophed Lactic acid: 5.1, repeat's pending Coox: n/a has femoral line Stress dose steroids: n/a        Intervention Category Major Interventions: Sepsis - evaluation and management  Danamarie Minami September 07, 2014, 3:18 PM

## 2014-09-04 NOTE — ED Notes (Signed)
Hyacinth Meeker, MD aware of pts troponin & K+

## 2014-09-05 DIAGNOSIS — Z992 Dependence on renal dialysis: Secondary | ICD-10-CM

## 2014-09-05 DIAGNOSIS — N186 End stage renal disease: Secondary | ICD-10-CM

## 2014-09-05 LAB — POCT I-STAT, CHEM 8
BUN: 84 mg/dL — ABNORMAL HIGH (ref 6–20)
Calcium, Ion: 1.07 mmol/L — ABNORMAL LOW (ref 1.13–1.30)
Chloride: 109 mmol/L (ref 101–111)
Creatinine, Ser: 11.5 mg/dL — ABNORMAL HIGH (ref 0.44–1.00)
Glucose, Bld: 147 mg/dL — ABNORMAL HIGH (ref 65–99)
HCT: 42 % (ref 36.0–46.0)
HEMOGLOBIN: 14.3 g/dL (ref 12.0–15.0)
Potassium: 7.6 mmol/L (ref 3.5–5.1)
Sodium: 134 mmol/L — ABNORMAL LOW (ref 135–145)
TCO2: 18 mmol/L (ref 0–100)

## 2014-09-05 LAB — POCT ACTIVATED CLOTTING TIME
ACTIVATED CLOTTING TIME: 140 s
ACTIVATED CLOTTING TIME: 165 s
ACTIVATED CLOTTING TIME: 165 s
ACTIVATED CLOTTING TIME: 165 s
ACTIVATED CLOTTING TIME: 257 s
Activated Clotting Time: 110 seconds
Activated Clotting Time: 116 seconds
Activated Clotting Time: 159 seconds
Activated Clotting Time: 171 seconds
Activated Clotting Time: 190 seconds
Activated Clotting Time: 196 seconds
Activated Clotting Time: 196 seconds
Activated Clotting Time: 202 seconds
Activated Clotting Time: 220 seconds
Activated Clotting Time: 245 seconds
Activated Clotting Time: 245 seconds
Activated Clotting Time: 245 seconds
Activated Clotting Time: 251 seconds

## 2014-09-05 LAB — BASIC METABOLIC PANEL
ANION GAP: 13 (ref 5–15)
BUN: 36 mg/dL — ABNORMAL HIGH (ref 6–20)
CALCIUM: 8.6 mg/dL — AB (ref 8.9–10.3)
CO2: 23 mmol/L (ref 22–32)
Chloride: 103 mmol/L (ref 101–111)
Creatinine, Ser: 6.52 mg/dL — ABNORMAL HIGH (ref 0.44–1.00)
GFR calc Af Amer: 7 mL/min — ABNORMAL LOW (ref >60)
GFR, EST NON AFRICAN AMERICAN: 6 mL/min — AB (ref >60)
GLUCOSE: 122 mg/dL — AB (ref 65–99)
Potassium: 5.3 mmol/L — ABNORMAL HIGH (ref 3.5–5.1)
Sodium: 139 mmol/L (ref 135–145)

## 2014-09-05 LAB — GLUCOSE, CAPILLARY
GLUCOSE-CAPILLARY: 83 mg/dL (ref 65–99)
Glucose-Capillary: 135 mg/dL — ABNORMAL HIGH (ref 65–99)
Glucose-Capillary: 77 mg/dL (ref 65–99)
Glucose-Capillary: 91 mg/dL (ref 65–99)
Glucose-Capillary: 94 mg/dL (ref 65–99)
Glucose-Capillary: 96 mg/dL (ref 65–99)
Glucose-Capillary: 98 mg/dL (ref 65–99)

## 2014-09-05 LAB — RENAL FUNCTION PANEL
ANION GAP: 9 (ref 5–15)
Albumin: 3.1 g/dL — ABNORMAL LOW (ref 3.5–5.0)
BUN: 19 mg/dL (ref 6–20)
CHLORIDE: 104 mmol/L (ref 101–111)
CO2: 26 mmol/L (ref 22–32)
Calcium: 8.7 mg/dL — ABNORMAL LOW (ref 8.9–10.3)
Creatinine, Ser: 3.79 mg/dL — ABNORMAL HIGH (ref 0.44–1.00)
GFR calc non Af Amer: 11 mL/min — ABNORMAL LOW (ref >60)
GFR, EST AFRICAN AMERICAN: 13 mL/min — AB (ref >60)
Glucose, Bld: 101 mg/dL — ABNORMAL HIGH (ref 65–99)
POTASSIUM: 5.2 mmol/L — AB (ref 3.5–5.1)
Phosphorus: 2.6 mg/dL (ref 2.5–4.6)
Sodium: 139 mmol/L (ref 135–145)

## 2014-09-05 LAB — CBC
HCT: 27.8 % — ABNORMAL LOW (ref 36.0–46.0)
Hemoglobin: 9 g/dL — ABNORMAL LOW (ref 12.0–15.0)
MCH: 27.4 pg (ref 26.0–34.0)
MCHC: 32.4 g/dL (ref 30.0–36.0)
MCV: 84.5 fL (ref 78.0–100.0)
PLATELETS: 92 10*3/uL — AB (ref 150–400)
RBC: 3.29 MIL/uL — AB (ref 3.87–5.11)
RDW: 18.5 % — AB (ref 11.5–15.5)
WBC: 6.2 10*3/uL (ref 4.0–10.5)

## 2014-09-05 LAB — LACTIC ACID, PLASMA: LACTIC ACID, VENOUS: 3 mmol/L — AB (ref 0.5–2.0)

## 2014-09-05 LAB — MAGNESIUM: Magnesium: 2.3 mg/dL (ref 1.7–2.4)

## 2014-09-05 LAB — APTT: APTT: 142 s — AB (ref 24–37)

## 2014-09-05 LAB — PHOSPHORUS: PHOSPHORUS: 3 mg/dL (ref 2.5–4.6)

## 2014-09-05 MED ORDER — SODIUM CHLORIDE 0.9 % IV SOLN
25.0000 ug/h | INTRAVENOUS | Status: DC
  Administered 2014-09-05: 200 ug/h via INTRAVENOUS
  Administered 2014-09-05 – 2014-09-06 (×2): 250 ug/h via INTRAVENOUS
  Administered 2014-09-06 – 2014-09-07 (×2): 275 ug/h via INTRAVENOUS
  Administered 2014-09-07: 200 ug/h via INTRAVENOUS
  Filled 2014-09-05 (×8): qty 50

## 2014-09-05 MED ORDER — ALBUTEROL SULFATE (2.5 MG/3ML) 0.083% IN NEBU
2.5000 mg | INHALATION_SOLUTION | RESPIRATORY_TRACT | Status: DC | PRN
  Administered 2014-09-05: 2.5 mg via RESPIRATORY_TRACT
  Filled 2014-09-05: qty 3

## 2014-09-05 NOTE — Progress Notes (Signed)
Initial Nutrition Assessment  DOCUMENTATION CODES:  Not applicable  INTERVENTION:   If TF started, recommend Nepro formula -- initiate at 10 ml/hr and increase by 10 ml every 8 hours to goal rate of 30 ml/hr   Prostat liquid protein 30 ml BID via tube  Total TF regimen to provide 1496 kcals, 88 gm protein, 523 ml of free water  NUTRITION DIAGNOSIS:  Inadequate oral intake related to inability to eat as evidenced by NPO status  GOAL:  Patient will meet greater than or equal to 90% of their needs  MONITOR:  Vent status, Labs, Weight trends, I & O's  REASON FOR ASSESSMENT:  Ventilator  ASSESSMENT: 69 y.o. Female with ESRD with Left I J tunneled HD cath, who presented with increased SOB prior to dialysis planned for 6/22 and while in ED required intubation for increased SOB and resp failure.  Patient is currently intubated on ventilator support -- OGT in place MV: 8.1 L/min Temp (24hrs), Avg:96.8 F (36 C), Min:94.4 F (34.7 C), Max:98.9 F (37.2 C)   Chart reviewed.  Pt with probable infected HD catheter. Currently on CVVHD.  RD unable to complete Nutrition Focused Physical Exam at this time; suspect malnutrition.  Height:  Ht Readings from Last 1 Encounters:  08/27/2014 6' (1.829 m)    Weight:  Wt Readings from Last 1 Encounters:  09/05/14 128 lb 4.9 oz (58.2 kg)    Ideal Body Weight:  73 kg  Wt Readings from Last 10 Encounters:  09/05/14 128 lb 4.9 oz (58.2 kg)    BMI:  Body mass index is 17.4 kg/(m^2).  Estimated Nutritional Needs:  Kcal:  1408  Protein:  90-100 gm  Fluid:  per MD  Skin:  Reviewed, no issues  Diet Order:  NPO  EDUCATION NEEDS:  No education needs identified at this time   Intake/Output Summary (Last 24 hours) at 09/05/14 1408 Last data filed at 09/05/14 1300  Gross per 24 hour  Intake 1141.05 ml  Output   1502 ml  Net -360.95 ml    Last BM:  Unknown   Maureen Chatters, RD, LDN Pager #: [phone] After-Hours  Pager #: [phone]

## 2014-09-05 NOTE — Progress Notes (Signed)
Subjective:  In ICU intubated overnight- pressors have been weaned to off this AM- CRRT working well- is on restraints for agitation Objective Vital signs in last 24 hours: Filed Vitals:   09/05/14 0630 09/05/14 0645 09/05/14 0700 09/05/14 0715  BP: 130/46 137/47 134/46   Pulse: 59 64 64 64  Temp:      TempSrc:      Resp: 16 16 16 16   Height:      Weight:      SpO2: 100% 98% 97% 100%   Weight change:   Intake/Output Summary (Last 24 hours) at 09/05/14 0723 Last data filed at 09/05/14 0700  Gross per 24 hour  Intake 873.45 ml  Output    978 ml  Net -104.55 ml    Assessment/Plan: 69 year old black female with hypertension, hyperlipidemia and ESRD. She required intubation for pulmonary edema. She also had hyperkalemia. Given the fact that I dealt with her in the past I suspect that she's been noncompliant with her dialysis. There also may be a PermCath infection contributing to the clinical Picture  1 respiratory failure secondary to pulmonary edema - required intubation. Really have not taken much fluid off due to hypotension- will increase UF today to 50-100 per hour- seems like may be able to extubate soon  2 ESRD: Normally Monday Wednesday Friday at [facility] Required CRRT due to hemodynamic instability- hemodynamics are better but would like to keep on CRRT today- likely to stop in the next 24-30 hours unless patient factors dictate [name] to stop  4. Anemia of ESRD: Hemoglobin 10.2 now 9 - will add darbe and  follow 5. Metabolic Bone Disease: Will monitor calcium and phosphorus while here and start appropriate medications 6. Possible PermCath infection- blood cultures obtained.- Also started on Zosyn and vanc.  If  blood cultures are positive will need to have PermCath removed   Brodan Grewell A    Labs: Basic Metabolic Panel:  Recent Labs Lab Sep 05, 2014 1253 September 05, 2014 1600 09/05/14 0335  NA 141 144 139  K 4.6 5.0 5.3*  CL 104 104 103  CO2 18* 19* 23  GLUCOSE  177* 91 122*  BUN 55* 58* 36*  CREATININE 10.76* 11.18* 6.52*  CALCIUM 9.5 9.4 8.6*  PHOS  --  5.6* 3.0   Liver Function Tests:  Recent Labs Lab 05-Sep-2014 1130 05-Sep-2014 1253 2014/09/05 1600  AST 37 45*  --   ALT 13* 12*  --   ALKPHOS 114 90  --   BILITOT 0.9 0.7  --   PROT 7.9 6.1*  --   ALBUMIN 3.9 3.1* 3.1*   No results for input(s): LIPASE, AMYLASE in the last 168 hours. No results for input(s): AMMONIA in the last 168 hours. CBC:  Recent Labs Lab 2014/09/05 1130 09/05/14 1141 09/05/2014 1253 09/05/14 0335  WBC 19.7*  --  13.2* 6.2  NEUTROABS 15.2*  --  11.9*  --   HGB 11.7* 14.3 10.2* 9.0*  HCT 37.5 42.0 32.9* 27.8*  MCV 86.8  --  86.4 84.5  PLT 196  --  136* PENDING   Cardiac Enzymes: No results for input(s): CKTOTAL, CKMB, CKMBINDEX, TROPONINI in the last 168 hours. CBG:  Recent Labs Lab 09/05/14 1959 09/05/14 0108 09/05/14 0418  GLUCAP 129* 135* 98    Iron Studies: No results for input(s): IRON, TIBC, TRANSFERRIN, FERRITIN in the last 72 hours. Studies/Results: Dg Chest Portable 1 View  09-05-14   CLINICAL DATA:  Shortness of breath. Central line insertion. Check support apparatus  EXAM: PORTABLE CHEST - 1 VIEW  COMPARISON:  2014/09/22  FINDINGS: Endotracheal tube tip is 3 cm above the carina. Left dialysis catheter is unchanged. NG tube enters the stomach.  Hyperinflation of the lungs. Heart is normal size. Vascular congestion. Diffuse interstitial prominence throughout the lungs, increasing since prior study. No effusions or acute bony abnormality.  IMPRESSION: Increasing interstitial prominence throughout the lungs could reflect interstitial edema.  Endotracheal tube 3 cm above the carina.   Electronically Signed   By: Charlett Nose M.D.   On: 09-22-2014 12:09   Dg Chest Port 1 View  09-22-2014   CLINICAL DATA:  Initial encounter for shortness of breath and chest pain.  EXAM: PORTABLE CHEST - 1 VIEW  COMPARISON:  None.  FINDINGS: 1055 hrs. Left IJ dialysis  catheter tip projects in the mid right atrium. Interstitial markings are diffusely coarsened with chronic features. Mild vascular congestion without overt airspace pulmonary edema. There is bibasilar atelectasis. The cardio pericardial silhouette is enlarged. Telemetry leads overlie the chest. Vascular stent device noted in the right axillary region.  IMPRESSION: Vascular congestion without overt airspace pulmonary edema.   Electronically Signed   By: Kennith Center M.D.   On: 22-Sep-2014 11:43   Medications: Infusions: . sodium chloride 1,000 mL (September 22, 2014 1153)  . dexmedetomidine 0.5 mcg/kg/hr (09/05/14 0700)  . heparin 10,000 units/ 20 mL infusion syringe 1,150 Units/hr (09/05/14 0621)  . norepinephrine (LEVOPHED) Adult infusion Stopped (09/05/14 0000)  . dialysis replacement fluid (prismasate) 350 mL/hr at 2014/09/22 1740  . dialysis replacement fluid (prismasate) 300 mL/hr at 2014-09-22 1740  . dialysate (PRISMASATE) 1,500 mL/hr at 09/05/14 0446    Scheduled Medications: . antiseptic oral rinse  7 mL Mouth Rinse QID  . chlorhexidine  15 mL Mouth Rinse BID  . famotidine (PEPCID) IV  20 mg Intravenous Daily  . heparin subcutaneous  5,000 Units Subcutaneous 3 times per day  . piperacillin-tazobactam  3.375 g Intravenous Q6H  . vancomycin  500 mg Intravenous Q24H    have reviewed scheduled and prn medications.  Physical Exam: General: alert- agitated- setting off vent alarm Heart: RRR Lungs: CBS bilat Abdomen: soft, non tender Extremities: no peripheral edema Dialysis Access: PC with erythema at exit site    09/05/2014,7:23 AM  LOS: 1 day

## 2014-09-05 NOTE — Progress Notes (Signed)
PULMONARY / CRITICAL CARE MEDICINE   Name: Alexandra [name] MRN: [id number] DOB: 05/30/1945    ADMISSION DATE:  09/05/2014   REFERRING MD :  EDP  CHIEF COMPLAINT:  SOB  INITIAL PRESENTATION: SOB on HD day  STUDIES:    SIGNIFICANT EVENTS: 6/22 intubated in er   HISTORY OF PRESENT ILLNESS:   [name] , ESRD with Left I J tunneled HD cath, who presents with increased SOB prior to dialysis planned for today(6/22) and while in ED required intubation for increased SOB and resp failure. Very little information available at time of admission other than ESRD, WBC is 19 therefore will admit and treat empirically with abx , note her HD cath looks inflamed.  We will consult renal for hemodialysis as we note K+ of 7.6. Further information will be added as available.   SUBJECTIVE: off levophed since 3 am On CRRT, warming blanket awake  VITAL SIGNS: Temp:  [94 F (34.4 C)-98.9 F (37.2 C)] 98.2 F (36.8 C) (06/23 0800) Pulse Rate:  [57-112] 70 (06/23 0800) Resp:  [7-34] 16 (06/23 0800) BP: (71-199)/(32-106) 126/46 mmHg (06/23 0800) SpO2:  [89 %-100 %] 100 % (06/23 0800) FiO2 (%):  [40 %] 40 % (06/23 0800) Weight:  [127 lb (57.607 kg)-150 lb (68.04 kg)] 128 lb 4.9 oz (58.2 kg) (06/23 0330) HEMODYNAMICS: CVP:  [7 mmHg-10 mmHg] 10 mmHg VENTILATOR SETTINGS: Vent Mode:  [-] PRVC FiO2 (%):  [40 %] 40 % Set Rate:  [16 bmp-18 bmp] 16 bmp Vt Set:  [530 mL] 530 mL PEEP:  [5 cmH20] 5 cmH20 Plateau Pressure:  [20 cmH20-35 cmH20] 26 cmH20 INTAKE / OUTPUT:  Intake/Output Summary (Last 24 hours) at 09/05/14 0844 Last data filed at 09/05/14 0800  Gross per 24 hour  Intake 923.55 ml  Output   1025 ml  Net -101.45 ml    PHYSICAL EXAMINATION: General:  AAF sedated on vent Neuro:  Awake, RASS 0, non focal HEENT:  Rt EJ IV noted, no jvd Cardiovascular:  HSR RRR Lungs: Coarse rhonchi Abdomen:  Soft + bs Musculoskeletal:  intact Skin:  Warm and dry  LABS:  CBC  Recent Labs Lab  09/02/2014 1130 08/18/2014 1141 09/06/2014 1253 09/05/14 0335  WBC 19.7*  --  13.2* 6.2  HGB 11.7* 14.3 10.2* 9.0*  HCT 37.5 42.0 32.9* 27.8*  PLT 196  --  136* 92*   Coag's  Recent Labs Lab 08/19/2014 1253 09/05/14 0335  APTT 24 142*  INR 1.06  --    BMET  Recent Labs Lab 09/02/2014 1253 08/20/2014 1600 09/05/14 0335  NA 141 144 139  K 4.6 5.0 5.3*  CL 104 104 103  CO2 18* 19* 23  BUN 55* 58* 36*  CREATININE 10.76* 11.18* 6.52*  GLUCOSE 177* 91 122*   Electrolytes  Recent Labs Lab 09/05/2014 1253 08/30/2014 1600 09/05/14 0335  CALCIUM 9.5 9.4 8.6*  MG  --   --  2.3  PHOS  --  5.6* 3.0   Sepsis Markers  Recent Labs Lab 08/29/2014 1529 09/11/2014 2000 09/05/14 0340  LATICACIDVEN 6.1* 4.8* 3.0*   ABG  Recent Labs Lab 08/21/2014 1308 08/22/2014 1630  PHART 7.322* 7.316*  PCO2ART 35.7 32.0*  PO2ART 182.0* 170*   Liver Enzymes  Recent Labs Lab 08/27/2014 1130 09/12/2014 1253 09/07/2014 1600  AST 37 45*  --   ALT 13* 12*  --   ALKPHOS 114 90  --   BILITOT 0.9 0.7  --   ALBUMIN 3.9 3.1* 3.1*  Cardiac Enzymes No results for input(s): TROPONINI, PROBNP in the last 168 hours. Glucose  Recent Labs Lab 08/23/2014 1959 09/05/14 0108 09/05/14 0418 09/05/14 0823  GLUCAP 129* 135* 98 96    Imaging Dg Chest Portable 1 View  08/28/2014   CLINICAL DATA:  Shortness of breath. Central line insertion. Check support apparatus  EXAM: PORTABLE CHEST - 1 VIEW  COMPARISON:  08/27/2014  FINDINGS: Endotracheal tube tip is 3 cm above the carina. Left dialysis catheter is unchanged. NG tube enters the stomach.  Hyperinflation of the lungs. Heart is normal size. Vascular congestion. Diffuse interstitial prominence throughout the lungs, increasing since prior study. No effusions or acute bony abnormality.  IMPRESSION: Increasing interstitial prominence throughout the lungs could reflect interstitial edema.  Endotracheal tube 3 cm above the carina.   Electronically Signed   By: Charlett Nose M.D.   On: 09/10/2014 12:09   Dg Chest Port 1 View  08/30/2014   CLINICAL DATA:  Initial encounter for shortness of breath and chest pain.  EXAM: PORTABLE CHEST - 1 VIEW  COMPARISON:  None.  FINDINGS: 1055 hrs. Left IJ dialysis catheter tip projects in the mid right atrium. Interstitial markings are diffusely coarsened with chronic features. Mild vascular congestion without overt airspace pulmonary edema. There is bibasilar atelectasis. The cardio pericardial silhouette is enlarged. Telemetry leads overlie the chest. Vascular stent device noted in the right axillary region.  IMPRESSION: Vascular congestion without overt airspace pulmonary edema.   Electronically Signed   By: Kennith Center M.D.   On: 09/10/2014 11:43     ASSESSMENT / PLAN:  PULMONARY OETT 6/22>> A: VDRF secondary to fluid overload P:   Vent bundle pCXR in am SBTs but hold off extubation x 24h for neg balance  CARDIOVASCULAR CVLleft i j tunneled hd cath Femoral cvl>> A:  HTN ?septic shock P:  Off levophed   RENAL A:  ESRD Hyperkalemia  P:   Neg 100 on CRRT per Renal   GASTROINTESTINAL A:  GI protection P:   PPI  HEMATOLOGIC A:  No known acute issue P:   INFECTIOUS A:  presumed infection with HD cath presumed source P:   BCx2 6/22>> UC 6/22>> Sputum6/22>> Abx:  6/22 vanc>> 6/22 pip-tazo>>  May need permacth pulled if positive blood cx  ENDOCRINE A:   Hyperglycemia P:   SSI  NEUROLOGIC A:   Agitation P:   Rass goal 0 fent prn   FAMILY  - Updates: None at bedside  - Inter-disciplinary family meet or Palliative Care meeting due by:  day 7    TODAY'S SUMMARY: [name] , ESRD with Left I J tunneled HD cath, non compliant, intubated for acute pulmonary edema & septic shock ? Line sepsis -await blood cx Keep intubated for neg balance  The patient is critically ill with multiple organ systems failure and requires high complexity decision making for assessment and  support, frequent evaluation and titration of therapies, application of advanced monitoring technologies and extensive interpretation of multiple databases. Critical Care Time devoted to patient care services described in this note independent of APP time is 35 minutes.   Cyril Mourning MD. Tonny Bollman. [facility] Pager [phone] If no response call [phone]     09/05/2014, 8:44 AM

## 2014-09-05 NOTE — Progress Notes (Signed)
eLink Physician-Brief Progress Note Patient Name: Alexandra [name] DOB: 26-Jul-1945 MRN: [id number]   Date of Service  09/05/2014  HPI/Events of Note  Vent dysynchrony, pulling tubes, lines, can't lie still for CRRT  eICU Interventions  Fentanyl gtt     Intervention Category Major Interventions: Change in mental status - evaluation and management  Hallel Denherder 09/05/2014, 3:41 PM

## 2014-09-05 NOTE — Progress Notes (Signed)
CRITICAL VALUE ALERT  Critical value received:  Lactic Acid 3.0   Date of notification:  09/05/14  Time of notification:  0435  Critical value read back: yes  Nurse who received alert:  A. Lamar Benes RN   MD notified (1st page):  Dr. Dalbert Mayotte   Time of first page:  [phone]   No new orders at this time. Will continue to closely monitor. Thresa Ross RN

## 2014-09-06 ENCOUNTER — Inpatient Hospital Stay (HOSPITAL_COMMUNITY): Payer: Commercial Managed Care - HMO

## 2014-09-06 LAB — GLUCOSE, CAPILLARY
GLUCOSE-CAPILLARY: 83 mg/dL (ref 65–99)
Glucose-Capillary: 66 mg/dL (ref 65–99)
Glucose-Capillary: 112 mg/dL — ABNORMAL HIGH (ref 65–99)
Glucose-Capillary: 60 mg/dL — ABNORMAL LOW (ref 65–99)
Glucose-Capillary: 122 mg/dL — ABNORMAL HIGH (ref 65–99)
Glucose-Capillary: 59 mg/dL — ABNORMAL LOW (ref 65–99)
Glucose-Capillary: 23 mg/dL — CL (ref 65–99)

## 2014-09-06 LAB — CBC
HCT: 29.7 % — ABNORMAL LOW (ref 36.0–46.0)
HEMOGLOBIN: 9.3 g/dL — AB (ref 12.0–15.0)
MCH: 27.5 pg (ref 26.0–34.0)
MCHC: 31.3 g/dL (ref 30.0–36.0)
MCV: 87.9 fL (ref 78.0–100.0)
Platelets: 132 10*3/uL — ABNORMAL LOW (ref 150–400)
RBC: 3.38 MIL/uL — AB (ref 3.87–5.11)
RDW: 18.8 % — ABNORMAL HIGH (ref 11.5–15.5)
WBC: 8.4 10*3/uL (ref 4.0–10.5)

## 2014-09-06 LAB — RENAL FUNCTION PANEL
ALBUMIN: 2.8 g/dL — AB (ref 3.5–5.0)
Anion gap: 10 (ref 5–15)
BUN: 16 mg/dL (ref 6–20)
CALCIUM: 8.5 mg/dL — AB (ref 8.9–10.3)
CHLORIDE: 101 mmol/L (ref 101–111)
CO2: 25 mmol/L (ref 22–32)
CREATININE: 3.02 mg/dL — AB (ref 0.44–1.00)
GFR calc Af Amer: 17 mL/min — ABNORMAL LOW (ref >60)
GFR calc non Af Amer: 15 mL/min — ABNORMAL LOW (ref >60)
GLUCOSE: 347 mg/dL — AB (ref 65–99)
Phosphorus: 2.9 mg/dL (ref 2.5–4.6)
Potassium: 5 mmol/L (ref 3.5–5.1)
Sodium: 136 mmol/L (ref 135–145)

## 2014-09-06 LAB — CULTURE, RESPIRATORY

## 2014-09-06 LAB — CULTURE, RESPIRATORY W GRAM STAIN

## 2014-09-06 LAB — VANCOMYCIN, RANDOM: Vancomycin Rm: 16 ug/mL

## 2014-09-06 LAB — POCT ACTIVATED CLOTTING TIME
ACTIVATED CLOTTING TIME: 202 s
ACTIVATED CLOTTING TIME: 208 s
ACTIVATED CLOTTING TIME: 232 s
ACTIVATED CLOTTING TIME: 263 s
Activated Clotting Time: 233 seconds
Activated Clotting Time: 227 seconds
Activated Clotting Time: 214 seconds
Activated Clotting Time: 202 seconds

## 2014-09-06 LAB — BASIC METABOLIC PANEL
Anion gap: 13 (ref 5–15)
BUN: 15 mg/dL (ref 6–20)
CALCIUM: 8.7 mg/dL — AB (ref 8.9–10.3)
CHLORIDE: 99 mmol/L — AB (ref 101–111)
CO2: 26 mmol/L (ref 22–32)
CREATININE: 2.68 mg/dL — AB (ref 0.44–1.00)
GFR calc Af Amer: 19 mL/min — ABNORMAL LOW (ref >60)
GFR calc non Af Amer: 17 mL/min — ABNORMAL LOW (ref >60)
GLUCOSE: 81 mg/dL (ref 65–99)
Potassium: 5.5 mmol/L — ABNORMAL HIGH (ref 3.5–5.1)
Sodium: 138 mmol/L (ref 135–145)

## 2014-09-06 LAB — APTT: aPTT: >200 seconds (ref 24–37)

## 2014-09-06 MED ORDER — DEXTROSE 50 % IV SOLN
25.0000 mL | Freq: Once | INTRAVENOUS | Status: AC
  Administered 2014-09-06: 25 mL via INTRAVENOUS
  Filled 2014-09-06: qty 50

## 2014-09-06 MED ORDER — DEXTROSE-NACL 5-0.9 % IV SOLN
INTRAVENOUS | Status: DC
  Administered 2014-09-06 – 2014-09-08 (×2): via INTRAVENOUS

## 2014-09-06 MED ORDER — PIPERACILLIN-TAZOBACTAM IN DEX 2-0.25 GM/50ML IV SOLN
2.2500 g | Freq: Three times a day (TID) | INTRAVENOUS | Status: DC
Start: 2014-09-06 — End: 2014-09-06
  Filled 2014-09-06: qty 50

## 2014-09-06 MED ORDER — DEXTROSE 50 % IV SOLN
INTRAVENOUS | Status: AC
  Administered 2014-09-06: 25 mL
  Filled 2014-09-06: qty 50

## 2014-09-06 MED ORDER — DEXTROSE 50 % IV SOLN
25.0000 mL | Freq: Once | INTRAVENOUS | Status: AC
  Administered 2014-09-06: 25 mL via INTRAVENOUS

## 2014-09-06 MED ORDER — DEXTROSE 50 % IV SOLN
INTRAVENOUS | Status: AC
  Administered 2014-09-06: 25 mL via INTRAVENOUS
  Filled 2014-09-06: qty 50

## 2014-09-06 NOTE — Progress Notes (Addendum)
ANTIBIOTIC CONSULT NOTE - Follow-up  Pharmacy Consult for Vancomycin and Zosyn Indication: Bacteremia  Allergies  Allergen Reactions  . Codeine   . Dextrans   . Epinephrine   . Iron   . Niacin And Related   . Nifedipine   . Percocet [Oxycodone-Acetaminophen]   . Statins     Patient Measurements: Height: 6' (182.9 cm) Weight: 124 lb 12.5 oz (56.6 kg) IBW/kg (Calculated) : 73.1 Adjusted Body Weight:   Vital Signs: Temp: 99.6 F (37.6 C) (06/24 0800) Temp Source: Axillary (06/24 0800) BP: 117/30 mmHg (06/24 0900) Pulse Rate: 63 (06/24 0900) Intake/Output from previous day: 06/23 0701 - 06/24 0700 In: 1387.5 [I.V.:1022.5; NG/GT:90; IV Piggyback:275] Out: 3058 [Emesis/NG output:350] Intake/Output from this shift: Total I/O In: 112.3 [I.V.:62.3; IV Piggyback:50] Out: 317 [Other:317]  Labs:  Recent Labs  09-27-2014 1253  09/05/14 0335 09/05/14 1557 09/06/14 0500  WBC 13.2*  --  6.2  --  8.4  HGB 10.2*  --  9.0*  --  9.3*  PLT 136*  --  92*  --  132*  CREATININE 10.76*  < > 6.52* 3.79* 2.68*  < > = values in this interval not displayed. Estimated Creatinine Clearance: 17.2 mL/min (by C-G formula based on Cr of 2.68). No results for input(s): VANCOTROUGH, VANCOPEAK, VANCORANDOM, GENTTROUGH, GENTPEAK, GENTRANDOM, TOBRATROUGH, TOBRAPEAK, TOBRARND, AMIKACINPEAK, AMIKACINTROU, AMIKACIN in the last 72 hours.   Microbiology: Recent Results (from the past 720 hour(s))  Culture, blood (routine x 2)     Status: None (Preliminary result)   Collection Time: 2014-09-27 12:51 PM  Result Value Ref Range Status   Specimen Description BLOOD RIGHT  Final   Special Requests   Final    BOTTLES DRAWN AEROBIC AND ANAEROBIC 5CC RIGHT FEM LINE   Culture NO GROWTH < 24 HOURS  Final   Report Status PENDING  Incomplete  Culture, respiratory (NON-Expectorated)     Status: None   Collection Time: 09-27-14 12:53 PM  Result Value Ref Range Status   Specimen Description TRACHEAL ASPIRATE   Final   Special Requests NONE  Final   Gram Stain   Final    RARE WBC PRESENT, PREDOMINANTLY PMN NO SQUAMOUS EPITHELIAL CELLS SEEN RARE GRAM POSITIVE COCCI IN PAIRS Performed at [facility]    Culture   Final    Non-Pathogenic Oropharyngeal-type Flora Isolated. Performed at [facility]    Report Status 09/06/2014 FINAL  Final  Culture, blood (routine x 2)     Status: None (Preliminary result)   Collection Time: 27-Sep-2014  1:00 PM  Result Value Ref Range Status   Specimen Description BLOOD LEFT ARM  Final   Special Requests BOTTLES DRAWN AEROBIC AND ANAEROBIC 5CC  Final   Culture NO GROWTH < 24 HOURS  Final   Report Status PENDING  Incomplete  MRSA PCR Screening     Status: None   Collection Time: Sep 27, 2014  2:10 PM  Result Value Ref Range Status   MRSA by PCR NEGATIVE NEGATIVE Final    Comment:        The GeneXpert MRSA Assay (FDA approved for NASAL specimens only), is one component of a comprehensive MRSA colonization surveillance program. It is not intended to diagnose MRSA infection nor to guide or monitor treatment for MRSA infections.    Assessment: 69yo female ESRD-MWF HD who presents to ED with SOB. Started on vancomycin + zosyn for treatment of sepsis. Initiated on CRRT but now transitioning back to IHD. Pt is afebrile and WBC is  WNL. Received last vancomycin dose last night.   Zosyn 6/22>> Vanc 6/22>>  6/22 TA - NPOF 6/22 Blood - NGTD  Goal of Therapy:  pre-HD level 15-25  Plan:  - Change zosyn to 2.25gm IV Q8H - Hold vanc and check a level this afternoon to determine if pt will need another dose prior to HD tomorrow - F/u renal plans, C&S, clinical status and pre-HD trough when appropriate  Lysle Pearl, PharmD, BCPS Pager # [phone] 09/06/2014 9:05 AM  Addendum  Random vanc came back at 16 so therapeutic. Will resume schedule vanc tomorrow after HD.   Ulyses Southward, PharmD Pager: [phone] 09/06/2014 5:24 PM

## 2014-09-06 NOTE — Progress Notes (Signed)
1200 CBG 50  Dextrose 50  54ml given  1215 CBG 112

## 2014-09-06 NOTE — Progress Notes (Addendum)
1605 CBG 53 Dextrose 50 74ml given iv 1620 CBG 122

## 2014-09-06 NOTE — Progress Notes (Signed)
Subjective:  In ICU intubated overnight- still off pressors- CRRT working well- is on drips for agitation Objective Vital signs in last 24 hours: Filed Vitals:   09/06/14 0500 09/06/14 0600 09/06/14 0700 09/06/14 0729  BP: 105/55 101/42 102/59 102/59  Pulse: 64 63 66 60  Temp:      TempSrc:      Resp: 16 16 16 16   Height:      Weight: 56.6 kg (124 lb 12.5 oz)     SpO2: 100% 100% 100% 100%   Weight change: -11.439 kg (-25 lb 3.5 oz)  Intake/Output Summary (Last 24 hours) at 09/06/14 0809 Last data filed at 09/06/14 0700  Gross per 24 hour  Intake 1337.4 ml  Output   3031 ml  Net -1693.6 ml    Assessment/Plan: 69 year old black female with hypertension, hyperlipidemia and ESRD. She required intubation for pulmonary edema. She also had hyperkalemia. Given the fact that I dealt with her in the past I suspect that she's been noncompliant with her dialysis. There also may be [name] PermCath infection contributing to the clinical Picture  1 respiratory failure secondary to pulmonary edema - required intubation. Now running negative-CVP mostly single digits-  seems like may be able to extubate soon  2 ESRD: Normally Monday Wednesday Friday at [facility] followed by Dr. Louis Meckel via PC (incidently I spoke to Dr. Dyanne Carrel- she is very close to discharging Ms. Offut from her practice due to the same behaviors that led her to be discharged from our practice-. Required CRRT due to hemodynamic instability- hemodynamics are better so will stop CRRT and plan for IHD treatment tomorrow- I will stop this even though K is 5.5 3. Anemia of ESRD: Hemoglobin 10.2 now 9 - have added darbe and  follow 4. Metabolic Bone Disease: Will monitor calcium and phosphorus while here and start appropriate medications- phos low  5. Possible PermCath infection- blood cultures obtained.- Also started on Zosyn and vanc- WBC down.  If  blood cultures are positive will need to have PermCath removed- are negative so  far    Alexandra [name]    Labs: Basic Metabolic Panel:  Recent Labs Lab 09/16/14 1600 09/05/14 0335 09/05/14 1557 09/06/14 0500  NA 144 139 139 138  K 5.0 5.3* 5.2* 5.5*  CL 104 103 104 99*  CO2 19* 23 26 26   GLUCOSE 91 122* 101* 81  BUN 58* 36* 19 15  CREATININE 11.18* 6.52* 3.79* 2.68*  CALCIUM 9.4 8.6* 8.7* 8.7*  PHOS 5.6* 3.0 2.6  --    Liver Function Tests:  Recent Labs Lab 09-16-14 1130 16-Sep-2014 1253 September 16, 2014 1600 09/05/14 1557  AST 37 45*  --   --   ALT 13* 12*  --   --   ALKPHOS 114 90  --   --   BILITOT 0.9 0.7  --   --   PROT 7.9 6.1*  --   --   ALBUMIN 3.9 3.1* 3.1* 3.1*   No results for input(s): LIPASE, AMYLASE in the last 168 hours. No results for input(s): AMMONIA in the last 168 hours. CBC:  Recent Labs Lab 16-Sep-2014 1130  September 16, 2014 1253 09/05/14 0335 09/06/14 0500  WBC 19.7*  --  13.2* 6.2 8.4  NEUTROABS 15.2*  --  11.9*  --   --   HGB 11.7*  < > 10.2* 9.0* 9.3*  HCT 37.5  < > 32.9* 27.8* 29.7*  MCV 86.8  --  86.4 84.5 87.9  PLT 196  --  136* 92* 132*  < > = values in this interval not displayed. Cardiac Enzymes: No results for input(s): CKTOTAL, CKMB, CKMBINDEX, TROPONINI in the last 168 hours. CBG:  Recent Labs Lab 09/05/14 1148 09/05/14 1546 09/05/14 2016 09/05/14 2352 09/06/14 0357  GLUCAP 94 91 83 77 66    Iron Studies: No results for input(s): IRON, TIBC, TRANSFERRIN, FERRITIN in the last 72 hours. Studies/Results: Dg Chest Portable 1 View  09-17-14   CLINICAL DATA:  Shortness of breath. Central line insertion. Check support apparatus  EXAM: PORTABLE CHEST - 1 VIEW  COMPARISON:  09/17/14  FINDINGS: Endotracheal tube tip is 3 cm above the carina. Left dialysis catheter is unchanged. NG tube enters the stomach.  Hyperinflation of the lungs. Heart is normal size. Vascular congestion. Diffuse interstitial prominence throughout the lungs, increasing since prior study. No effusions or acute bony abnormality.   IMPRESSION: Increasing interstitial prominence throughout the lungs could reflect interstitial edema.  Endotracheal tube 3 cm above the carina.   Electronically Signed   By: Charlett Nose M.D.   On: Sep 17, 2014 12:09   Dg Chest Port 1 View  17-Sep-2014   CLINICAL DATA:  Initial encounter for shortness of breath and chest pain.  EXAM: PORTABLE CHEST - 1 VIEW  COMPARISON:  None.  FINDINGS: 1055 hrs. Left IJ dialysis catheter tip projects in the mid right atrium. Interstitial markings are diffusely coarsened with chronic features. Mild vascular congestion without overt airspace pulmonary edema. There is bibasilar atelectasis. The cardio pericardial silhouette is enlarged. Telemetry leads overlie the chest. Vascular stent device noted in the right axillary region.  IMPRESSION: Vascular congestion without overt airspace pulmonary edema.   Electronically Signed   By: Kennith Center M.D.   On: 2014/09/17 11:43   Medications: Infusions: . sodium chloride 1,000 mL (09/17/14 1153)  . dexmedetomidine 1.2 mcg/kg/hr (09/06/14 0600)  . fentaNYL infusion INTRAVENOUS 250 mcg/hr (09/06/14 0740)  . heparin 10,000 units/ 20 mL infusion syringe 500 Units/hr (09/06/14 0400)  . norepinephrine (LEVOPHED) Adult infusion Stopped (09/05/14 0000)  . dialysis replacement fluid (prismasate) 350 mL/hr at 09/05/14 2300  . dialysis replacement fluid (prismasate) 300 mL/hr at 09/06/14 0430  . dialysate (PRISMASATE) 1,500 mL/hr at 09/06/14 0801    Scheduled Medications: . antiseptic oral rinse  7 mL Mouth Rinse QID  . chlorhexidine  15 mL Mouth Rinse BID  . dextrose  25 mL Intravenous Once  . famotidine (PEPCID) IV  20 mg Intravenous Daily  . heparin subcutaneous  5,000 Units Subcutaneous 3 times per day  . piperacillin-tazobactam  3.375 g Intravenous Q6H  . vancomycin  500 mg Intravenous Q24H    have reviewed scheduled and prn medications.  Physical Exam: General: sedated Heart: RRR Lungs: CBS bilat Abdomen: soft, non  tender Extremities: no peripheral edema Dialysis Access: PC with erythema at exit site - difficult to gauge tenderness   09/06/2014,8:09 AM  LOS: 2 days

## 2014-09-06 NOTE — Progress Notes (Signed)
Hypoglycemic Event  CBG:66  Treatment: D50 IV 25 mL  Symptoms: None  Follow-up CBG: Time:0415 CBG Result:125  Possible Reasons for Event: Inadequate meal intake  Comments/MD notified:na    Burna Cash F  Remember to initiate Hypoglycemia Order Set & complete

## 2014-09-06 NOTE — Progress Notes (Signed)
PULMONARY / CRITICAL CARE MEDICINE   Name: Alexandra [name] MRN: [id number] DOB: [birth year]    ADMISSION DATE:  08/20/2014   REFERRING MD :  EDP  CHIEF COMPLAINT:  SOB  INITIAL PRESENTATION: SOB on HD day  STUDIES:    SIGNIFICANT EVENTS: 6/22 intubated in er   HISTORY OF PRESENT ILLNESS:   [name] , ESRD with Left I J tunneled HD cath, who presents with increased SOB prior to dialysis planned for today(6/22) and while in ED required intubation for increased SOB and resp failure. Very little information available at time of admission other than ESRD, WBC is 19 therefore will admit and treat empirically with abx , note her HD cath looks inflamed.  We will consult renal for hemodialysis as we note K+ of 7.6. Further information will be added as available.   SUBJECTIVE: off levophed gtt On CRRT, warming blanket Agitated on WUA - fent gtt added  VITAL SIGNS: Temp:  [97.3 F (36.3 C)-99.6 F (37.6 C)] 99.6 F (37.6 C) (06/24 0800) Pulse Rate:  [51-94] 63 (06/24 0900) Resp:  [16-23] 16 (06/24 0900) BP: (101-164)/(30-115) 117/30 mmHg (06/24 0900) SpO2:  [98 %-100 %] 100 % (06/24 0900) FiO2 (%):  [40 %] 40 % (06/24 0729) Weight:  [124 lb 12.5 oz (56.6 kg)] 124 lb 12.5 oz (56.6 kg) (06/24 0500) HEMODYNAMICS: CVP:  [1 mmHg-13 mmHg] 7 mmHg VENTILATOR SETTINGS: Vent Mode:  [-] PRVC FiO2 (%):  [40 %] 40 % Set Rate:  [16 bmp] 16 bmp Vt Set:  [530 mL] 530 mL PEEP:  [5 cmH20] 5 cmH20 Plateau Pressure:  [24 cmH20-28 cmH20] 25 cmH20 INTAKE / OUTPUT:  Intake/Output Summary (Last 24 hours) at 09/06/14 0923 Last data filed at 09/06/14 0900  Gross per 24 hour  Intake 1421.9 ml  Output   3277 ml  Net -1855.1 ml    PHYSICAL EXAMINATION: General:  AAF sedated on vent Neuro:  Awake, RASS 0 to +2, non focal HEENT:  Rt EJ IV noted, no jvd Cardiovascular:  HSR RRR Lungs: Coarse rhonchi Abdomen:  Soft + bs Musculoskeletal:  intact Skin:  Warm and dry  LABS:  CBC  Recent  Labs Lab 08/16/2014 1253 09/05/14 0335 09/06/14 0500  WBC 13.2* 6.2 8.4  HGB 10.2* 9.0* 9.3*  HCT 32.9* 27.8* 29.7*  PLT 136* 92* 132*   Coag's  Recent Labs Lab 08/26/2014 1253 09/05/14 0335 09/06/14 0500  APTT 24 142* >200*  INR 1.06  --   --    BMET  Recent Labs Lab 09/05/14 0335 09/05/14 1557 09/06/14 0500  NA 139 139 138  K 5.3* 5.2* 5.5*  CL 103 104 99*  CO2 BUN 36* 19 15  CREATININE 6.52* 3.79* 2.68*  GLUCOSE 122* 101* 81   Electrolytes  Recent Labs Lab 09/07/2014 1600 09/05/14 0335 09/05/14 1557 09/06/14 0500  CALCIUM 9.4 8.6* 8.7* 8.7*  MG  --  2.3  --   --   PHOS 5.6* 3.0 2.6  --    Sepsis Markers  Recent Labs Lab 09/03/2014 1529 08/26/2014 2000 09/05/14 0340  LATICACIDVEN 6.1* 4.8* 3.0*   ABG  Recent Labs Lab 08/23/2014 1308 09/03/2014 1630  PHART 7.322* 7.316*  PCO2ART 35.7 32.0*  PO2ART 182.0* 170*   Liver Enzymes  Recent Labs Lab 09/12/2014 1130 08/19/2014 1253 08/23/2014 1600 09/05/14 1557  AST 37 45*  --   --   ALT 13* 12*  --   --   ALKPHOS 114 90  --   --  BILITOT 0.9 0.7  --   --   ALBUMIN 3.9 3.1* 3.1* 3.1*   Cardiac Enzymes No results for input(s): TROPONINI, PROBNP in the last 168 hours. Glucose  Recent Labs Lab 09/05/14 1148 09/05/14 1546 09/05/14 2016 09/05/14 2352 09/06/14 0357 09/06/14 0825  GLUCAP 94 91 83 77 66 112*    Imaging Dg Chest Port 1 View  09/06/2014   CLINICAL DATA:  Respiratory failure.  EXAM: PORTABLE CHEST - 1 VIEW  COMPARISON:  08/16/2014.  FINDINGS: Endotracheal tube and NG tube in stable position. Dialysis catheter stable position. Cardiomegaly with pulmonary vascular prominence and bilateral pulmonary interstitial prominence with small pleural effusions. Findings consistent with congestive heart failure. No pneumothorax.  IMPRESSION: 1. Lines and tubes including dialysis catheter stable position. 2. Congestive heart failure with bilateral pulmonary interstitial edema and small pleural  effusions. Similar findings noted on prior exam.   Electronically Signed   By: Maisie Fus  Register   On: 09/06/2014 08:10     ASSESSMENT / PLAN:  PULMONARY OETT 6/22>> A: VDRF secondary to fluid overload P:   Vent bundle SBTs but hold off extubation x 24h for neg balance  CARDIOVASCULAR CVL left i j tunneled hd cath Femoral cvl 6/22 >> A:  HTN ?septic shock P:  Off levophed  RENAL A:  ESRD Hyperkalemia  P:   Neg 100/h on CRRT per Renal   GASTROINTESTINAL A:  GI protection P:   PPI  HEMATOLOGIC A:  Anemia of chronic dz P: Transfuse for Hb<7  INFECTIOUS A:  presumed infection with HD cath presumed source P:   BCx2 6/22>> UC 6/22>> Sputum6/22>> oral flora Abx:  6/22 vanc>> 6/22 pip-tazo>> 6/24  May need permacth pulled if positive blood cx  ENDOCRINE A:   Hyperglycemia P:   SSI  NEUROLOGIC A:   Agitation P:   Rass goal 0 fent gtt + precedex    FAMILY  - Updates: None at bedside  - Inter-disciplinary family meet or Palliative Care meeting due by:  day 7    TODAY'S SUMMARY: [name] , ESRD with Left I J tunneled HD cath, non compliant, intubated for acute pulmonary edema & septic shock ? Line sepsis -await blood cx Keep intubated for neg balance   The patient is critically ill with multiple organ systems failure and requires high complexity decision making for assessment and support, frequent evaluation and titration of therapies, application of advanced monitoring technologies and extensive interpretation of multiple databases. Critical Care Time devoted to patient care services described in this note independent of APP time is 32 minutes.   Cyril Mourning MD. Tonny Bollman. [facility] Pager [phone] If no response call [phone]     09/06/2014, 9:23 AM

## 2014-09-06 NOTE — Progress Notes (Signed)
0810 CBG 60 D50 20ml given 0825 CBG 112  Critical value received from lab PTT>200 Dr. Kathrene Bongo present and notified.

## 2014-09-06 NOTE — Progress Notes (Signed)
eLink Physician-Brief Progress Note Patient Name: Alexandra [name] DOB: [birth year] MRN: [id number]   Date of Service  09/06/2014  HPI/Events of Note  Episodes of hypoglycemia.  eICU Interventions  Will add D5W at 40 ml/hr.     Intervention Category Major Interventions: Other:  Edword Cu 09/06/2014, 4:55 PM

## 2014-09-06 NOTE — Care Management Note (Signed)
Case Management Note  Patient Details  Name: Alexandra [name] MRN: [id number] Date of Birth: 11-13-1945  Subjective/Objective:       Patient remains intubated, plan for dcing CRRT today.  Received call from cousin, Almon Register,  in [place] at [phone].  She states that patient has no family in [place], but has a caregiver that assists her out of the kindness of her heart.  Her name is Tammy and her number is [phone].  Patient has a POA in [place] at [phone].  Britta Mccreedy states that she is understanding that the patient cannot walk, does not drive, cannot get herself to dialysis and doesn't have any food to eat or access to food and wanted to see if we could place her somewhere where she could get assistance.  Stated at this time she cannot talk to [name] but once she does this decision will be up to her, but I do appreciate her letting us know this.  States patient will not tell us this and she will probably not be nice to [name].  Once extubated will need PT/OT orders. CM will continue to follow for discharge needs.         Action/Plan:   Expected Discharge Date:                  Expected Discharge Plan:  Skilled Nursing Facility  In-House Referral:     Discharge planning Services     Post Acute Care Choice:    Choice offered to:     DME Arranged:    DME Agency:     HH Arranged:    HH Agency:     Status of Service:  In process, will continue to follow  Medicare Important Message Given:    Date Medicare IM Given:    Medicare IM give by:    Date Additional Medicare IM Given:    Additional Medicare Important Message give by:     If discussed at Long Length of Stay Meetings, dates discussed:    Additional Comments:  Vangie Bicker, RN 09/06/2014, 4:19 PM

## 2014-09-07 ENCOUNTER — Inpatient Hospital Stay (HOSPITAL_COMMUNITY): Payer: Commercial Managed Care - HMO

## 2014-09-07 LAB — CBC WITH DIFFERENTIAL/PLATELET
Basophils Absolute: 0 10*3/uL (ref 0.0–0.1)
Basophils Relative: 0 % (ref 0–1)
Eosinophils Absolute: 0.1 10*3/uL (ref 0.0–0.7)
Eosinophils Relative: 1 % (ref 0–5)
HCT: 27.2 % — ABNORMAL LOW (ref 36.0–46.0)
Hemoglobin: 8.6 g/dL — ABNORMAL LOW (ref 12.0–15.0)
Lymphocytes Relative: 20 % (ref 12–46)
Lymphs Abs: 2.1 10*3/uL (ref 0.7–4.0)
MCH: 27.4 pg (ref 26.0–34.0)
MCHC: 31.6 g/dL (ref 30.0–36.0)
MCV: 86.6 fL (ref 78.0–100.0)
MONO ABS: 1.6 10*3/uL — AB (ref 0.1–1.0)
MONOS PCT: 15 % — AB (ref 3–12)
NEUTROS PCT: 63 % (ref 43–77)
Neutro Abs: 6.5 10*3/uL (ref 1.7–7.7)
Platelets: 119 10*3/uL — ABNORMAL LOW (ref 150–400)
RBC: 3.14 MIL/uL — AB (ref 3.87–5.11)
RDW: 19 % — AB (ref 11.5–15.5)
WBC: 10.4 10*3/uL (ref 4.0–10.5)

## 2014-09-07 LAB — GLUCOSE, CAPILLARY
GLUCOSE-CAPILLARY: 50 mg/dL — AB (ref 65–99)
GLUCOSE-CAPILLARY: 75 mg/dL (ref 65–99)
Glucose-Capillary: 104 mg/dL — ABNORMAL HIGH (ref 65–99)
Glucose-Capillary: 112 mg/dL — ABNORMAL HIGH (ref 65–99)
Glucose-Capillary: 396 mg/dL — ABNORMAL HIGH (ref 65–99)
Glucose-Capillary: 102 mg/dL — ABNORMAL HIGH (ref 65–99)
Glucose-Capillary: 117 mg/dL — ABNORMAL HIGH (ref 65–99)
Glucose-Capillary: 93 mg/dL (ref 65–99)
Glucose-Capillary: 98 mg/dL (ref 65–99)

## 2014-09-07 LAB — RENAL FUNCTION PANEL
ALBUMIN: 2.6 g/dL — AB (ref 3.5–5.0)
ANION GAP: 11 (ref 5–15)
BUN: 8 mg/dL (ref 6–20)
CO2: 25 mmol/L (ref 22–32)
CREATININE: 1.96 mg/dL — AB (ref 0.44–1.00)
Calcium: 8.5 mg/dL — ABNORMAL LOW (ref 8.9–10.3)
Chloride: 102 mmol/L (ref 101–111)
GFR calc Af Amer: 28 mL/min — ABNORMAL LOW (ref >60)
GFR calc non Af Amer: 25 mL/min — ABNORMAL LOW (ref >60)
Glucose, Bld: 99 mg/dL (ref 65–99)
PHOSPHORUS: 3.5 mg/dL (ref 2.5–4.6)
Potassium: 3.9 mmol/L (ref 3.5–5.1)
Sodium: 138 mmol/L (ref 135–145)

## 2014-09-07 LAB — CBC
HCT: 25.2 % — ABNORMAL LOW (ref 36.0–46.0)
HCT: 27 % — ABNORMAL LOW (ref 36.0–46.0)
HCT: 27.3 % — ABNORMAL LOW (ref 36.0–46.0)
HEMATOCRIT: 26.1 % — AB (ref 36.0–46.0)
HEMOGLOBIN: 7.8 g/dL — AB (ref 12.0–15.0)
HEMOGLOBIN: 8.3 g/dL — AB (ref 12.0–15.0)
Hemoglobin: 8.6 g/dL — ABNORMAL LOW (ref 12.0–15.0)
Hemoglobin: 8.7 g/dL — ABNORMAL LOW (ref 12.0–15.0)
MCH: 26.9 pg (ref 26.0–34.0)
MCH: 27.3 pg (ref 26.0–34.0)
MCH: 27.3 pg (ref 26.0–34.0)
MCH: 27.2 pg (ref 26.0–34.0)
MCHC: 31 g/dL (ref 30.0–36.0)
MCHC: 31.9 g/dL (ref 30.0–36.0)
MCHC: 31.9 g/dL (ref 30.0–36.0)
MCHC: 31.8 g/dL (ref 30.0–36.0)
MCV: 86.9 fL (ref 78.0–100.0)
MCV: 85.7 fL (ref 78.0–100.0)
MCV: 85.6 fL (ref 78.0–100.0)
MCV: 85.6 fL (ref 78.0–100.0)
Platelets: 105 10*3/uL — ABNORMAL LOW (ref 150–400)
Platelets: 123 10*3/uL — ABNORMAL LOW (ref 150–400)
Platelets: 147 10*3/uL — ABNORMAL LOW (ref 150–400)
Platelets: 127 10*3/uL — ABNORMAL LOW (ref 150–400)
RBC: 2.9 MIL/uL — ABNORMAL LOW (ref 3.87–5.11)
RBC: 3.15 MIL/uL — ABNORMAL LOW (ref 3.87–5.11)
RBC: 3.19 MIL/uL — AB (ref 3.87–5.11)
RBC: 3.05 MIL/uL — AB (ref 3.87–5.11)
RDW: 18.9 % — ABNORMAL HIGH (ref 11.5–15.5)
RDW: 19.1 % — ABNORMAL HIGH (ref 11.5–15.5)
RDW: 18.8 % — AB (ref 11.5–15.5)
RDW: 18.9 % — ABNORMAL HIGH (ref 11.5–15.5)
WBC: 8 10*3/uL (ref 4.0–10.5)
WBC: 10.3 10*3/uL (ref 4.0–10.5)
WBC: 11.4 10*3/uL — ABNORMAL HIGH (ref 4.0–10.5)
WBC: 9.5 10*3/uL (ref 4.0–10.5)

## 2014-09-07 LAB — CORTISOL: Cortisol, Plasma: 20.9 ug/dL

## 2014-09-07 LAB — PROTIME-INR
INR: 1.34 (ref 0.00–1.49)
PROTHROMBIN TIME: 16.7 s — AB (ref 11.6–15.2)

## 2014-09-07 LAB — APTT: aPTT: 34 seconds (ref 24–37)

## 2014-09-07 MED ORDER — SODIUM CHLORIDE 0.9 % IV SOLN
15.0000 ug | Freq: Once | INTRAVENOUS | Status: AC
  Administered 2014-09-07: 15.2 ug via INTRAVENOUS
  Filled 2014-09-07: qty 3.8

## 2014-09-07 MED ORDER — VANCOMYCIN HCL 500 MG IV SOLR
500.0000 mg | INTRAVENOUS | Status: AC
  Administered 2014-09-07: 500 mg via INTRAVENOUS
  Filled 2014-09-07: qty 500

## 2014-09-07 MED ORDER — ANTICOAGULANT SODIUM CITRATE 4% (200MG/5ML) IV SOLN
5.0000 mL | Freq: Once | Status: AC
  Administered 2014-09-07: 5 mL via INTRAVENOUS
  Filled 2014-09-07: qty 250

## 2014-09-07 NOTE — Progress Notes (Signed)
PULMONARY / CRITICAL CARE MEDICINE   Name: Alexandra [name] MRN: [id number] DOB: 1946-02-24    ADMISSION DATE:  09/10/2014   REFERRING MD :  EDP  CHIEF COMPLAINT:  SOB  INITIAL PRESENTATION: SOB on HD day  STUDIES:    SIGNIFICANT EVENTS: 6/22 intubated in er 6/25- rectal bleeding noted, HD   HISTORY OF PRESENT ILLNESS:   [name] , ESRD with Left I J tunneled HD cath, who presents with increased SOB prior to dialysis planned for today(6/22) and while in ED required intubation for increased SOB and resp failure. Very little information available at time of admission other than ESRD, WBC is 19 therefore will admit and treat empirically with abx , note her HD cath looks inflamed.  We will consult renal for hemodialysis as we note K+ of 7.6. Further information will be added as available.   SUBJECTIVE: blood noted in rectum  VITAL SIGNS: Temp:  [96.7 F (35.9 C)-99.6 F (37.6 C)] 97.5 F (36.4 C) (06/25 1021) Pulse Rate:  [55-89] 61 (06/25 1100) Resp:  [13-19] 16 (06/25 1045) BP: (79-154)/(31-109) 154/44 mmHg (06/25 1100) SpO2:  [100 %] 100 % (06/25 1100) FiO2 (%):  [40 %] 40 % (06/25 1000) Weight:  [55.8 kg (123 lb 0.3 oz)-62.5 kg (137 lb 12.6 oz)] 62.5 kg (137 lb 12.6 oz) (06/25 1021) HEMODYNAMICS: CVP:  [6 mmHg-17 mmHg] 12 mmHg VENTILATOR SETTINGS: Vent Mode:  [-] PRVC FiO2 (%):  [40 %] 40 % Set Rate:  [16 bmp] 16 bmp Vt Set:  [530 mL] 530 mL PEEP:  [5 cmH20] 5 cmH20 Plateau Pressure:  [22 cmH20-29 cmH20] 22 cmH20 INTAKE / OUTPUT:  Intake/Output Summary (Last 24 hours) at 09/07/14 1108 Last data filed at 09/07/14 1000  Gross per 24 hour  Intake 1895.6 ml  Output    450 ml  Net 1445.6 ml    PHYSICAL EXAMINATION: General:  AAF sedated on vent Neuro:  Awake, RASS -1 HEENT:  Rt EJ IV noted, jvd Cardiovascular:  HSR RRR Lungs: Coarse bilateral Abdomen:  Soft + bs Musculoskeletal:  intact Skin:  Warm and dry  LABS:  CBC  Recent Labs Lab 09/06/14 0500  09/07/14 0400 09/07/14 0950  WBC 8.4 8.0 10.4  HGB 9.3* 7.8* 8.6*  HCT 29.7* 25.2* 27.2*  PLT 132* 105* 119*   Coag's  Recent Labs Lab 08/29/2014 1253 09/05/14 0335 09/06/14 0500 09/07/14 0950  APTT 24 142* >200* 34  INR 1.06  --   --  1.34   BMET  Recent Labs Lab 09/05/14 1557 09/06/14 0500 09/06/14 1600  NA 139 138 136  K 5.2* 5.5* 5.0  CL 104 99* 101  CO2 BUN CREATININE 3.79* 2.68* 3.02*  GLUCOSE 101* 81 347*   Electrolytes  Recent Labs Lab 09/05/14 0335 09/05/14 1557 09/06/14 0500 09/06/14 1600  CALCIUM 8.6* 8.7* 8.7* 8.5*  MG 2.3  --   --   --   PHOS 3.0 2.6  --  2.9   Sepsis Markers  Recent Labs Lab 08/25/2014 1529 08/28/2014 2000 09/05/14 0340  LATICACIDVEN 6.1* 4.8* 3.0*   ABG  Recent Labs Lab 09/12/2014 1308 08/25/2014 1630  PHART 7.322* 7.316*  PCO2ART 35.7 32.0*  PO2ART 182.0* 170*   Liver Enzymes  Recent Labs Lab 08/19/2014 1130 08/18/2014 1253 08/30/2014 1600 09/05/14 1557 09/06/14 1600  AST 37 45*  --   --   --   ALT 13* 12*  --   --   --  ALKPHOS 114 90  --   --   --   BILITOT 0.9 0.7  --   --   --   ALBUMIN 3.9 3.1* 3.1* 3.1* 2.8*   Cardiac Enzymes No results for input(s): TROPONINI, PROBNP in the last 168 hours. Glucose  Recent Labs Lab 09/06/14 2006 09/06/14 2008 09/07/14 0007 09/07/14 0419 09/07/14 0815 09/07/14 0819  GLUCAP 23* 83 104* 102* 75 117*    Imaging Dg Chest Port 1 View  09/07/2014   CLINICAL DATA:  Acute respiratory failure  EXAM: PORTABLE CHEST - 1 VIEW  COMPARISON:  09/06/2014  FINDINGS: Mild cardiomegaly is again identified. A dialysis catheter, endotracheal tube and nasogastric catheter are again seen and stable. Improving aeration is noted bilaterally. Mild vascular congestion remains although the previously seen effusions have improved. This may be in part due to patient positioning. Right arm venous stenting is noted.  IMPRESSION: Improved aeration bilaterally. This may be in  part due to patient positioning.  Tubes and lines as described.   Electronically Signed   By: Alcide Clever M.D.   On: 09/07/2014 07:43     ASSESSMENT / PLAN:  PULMONARY OETT 6/22>> A: VDRF secondary to fluid overload P:   Vent bundle Wean post HD, cpap 5 ps 5, goal 30 min  Post pcxr reveals still fluid fissure, edema Neg balance Keep same MV on vent  CARDIOVASCULAR CVL left i j tunneled hd cath Femoral cvl 6/22 >> A:  HTN ?septic shock P:  Resolved MAP Tele Low threshold echo, for effusion  RENAL A:  ESRD Hyperkalemia  P:   HD today Chem in am   GASTROINTESTINAL A:  GI protection, rectal bleeding P:   PPI Feed if not extubated by today See heme Cbc STAT and q6h will eval rectum, likely fissure, hemm as resolved fast At risk AVM however  HEMATOLOGIC A:  Anemia of chronic dz Rectal bleeding P:  Transfuse for Hb<7 scd ddavp stat Stat coags, concern residual ptt from hep cvvhd May need GI consult  INFECTIOUS A:  presumed infection with HD cath presumed source Remains culture neg P:   BCx2 6/22>> UC 6/22>> Sputum6/22>> oral flora Abx:  6/22 vanc>> 6/22 pip-tazo>> 6/24  May need permacth pulled if positive blood cx, neg thus far and clinically improved  ENDOCRINE A:   Hyperglycemia P:   SSI Cortisol if drops BP  NEUROLOGIC A:   Agitation P:   Rass goal 0 fent gtt +  Precedex, dc if BP drops   FAMILY  - Updates: None at bedside  - Inter-disciplinary family meet or Palliative Care meeting due by:  day 7   Ccm time 30 min   Mcarthur Rossetti. Tyson Alias, MD, FACP Pgr: [phone] [facility]

## 2014-09-07 NOTE — Progress Notes (Signed)
CCM MD called and notified that patients AM Hgb dropped to 7.8. When performing assessment and turning patient, large bright red blood was noted to be coming from patient's rectum. New orders received. Franki Cabot, RN

## 2014-09-07 NOTE — Progress Notes (Signed)
RT inserted bite block to relieve pressure from ETT due to patient biting. RT will monitor.

## 2014-09-07 NOTE — Progress Notes (Signed)
Subjective:  In ICU intubated overnight- still off pressors- CRRT stopped- CVP 9-10 Objective Vital signs in last 24 hours: Filed Vitals:   09/07/14 0400 09/07/14 0500 09/07/14 0510 09/07/14 0600  BP: 121/37 133/43 133/43 125/54  Pulse: 58 58 59 66  Temp: 99 F (37.2 C)     TempSrc: Oral     Resp: Height:      Weight:  55.8 kg (123 lb 0.3 oz)    SpO2: 100% 100% 100% 100%   Weight change: -0.8 kg (-1 lb 12.2 oz)  Intake/Output Summary (Last 24 hours) at 09/07/14 0657 Last data filed at 09/07/14 0600  Gross per 24 hour  Intake 1919.9 ml  Output   1346 ml  Net  573.9 ml    Assessment/Plan: 69 year old black female with hypertension, hyperlipidemia and ESRD. She required intubation for pulmonary edema. She also had hyperkalemia. Given the fact that I dealt with her in the past I suspect that she's been noncompliant with her dialysis. There also may be a PermCath infection contributing to the clinical Picture  1 respiratory failure secondary to pulmonary edema - required intubation. Were able to get fluid off with CRRT before stopped-CVP mostly single digits-  seems like may be able to extubate soon, maybe after HD today   2 ESRD: Normally Monday Wednesday Friday at [facility]- [facility] unit followed by Dr. Louis Meckel via PC (incidently I spoke to Dr. Dyanne Carrel- she is very close to discharging Ms. Offut from her practice due to the same behaviors that led her to be discharged from our practice-. Required CRRT due to hemodynamic instability- hemodynamics  better CRRT stopped 6/24  and plan for IHD treatment today-  3. Anemia of ESRD: Hemoglobin 10.2 ---9---7.8  - have added darbe and  follow 4. Metabolic Bone Disease: Will monitor calcium and phosphorus while here and start appropriate medications- phos low  5. Possible PermCath infection- blood cultures obtained.- Also started on Zosyn and vanc- WBC down.  If  blood cultures are positive will need to have PermCath removed- are  negative so far    Kennidi Yoshida A    Labs: Basic Metabolic Panel:  Recent Labs Lab 09/05/14 0335 09/05/14 1557 09/06/14 0500 09/06/14 1600  NA 139 139 138 136  K 5.3* 5.2* 5.5* 5.0  CL 103 104 99* 101  CO2 GLUCOSE 122* 101* 81 347*  BUN 36* CREATININE 6.52* 3.79* 2.68* 3.02*  CALCIUM 8.6* 8.7* 8.7* 8.5*  PHOS 3.0 2.6  --  2.9   Liver Function Tests:  Recent Labs Lab 11-Sep-2014 1130 September 11, 2014 1253 2014-09-11 1600 09/05/14 1557 09/06/14 1600  AST 37 45*  --   --   --   ALT 13* 12*  --   --   --   ALKPHOS 114 90  --   --   --   BILITOT 0.9 0.7  --   --   --   PROT 7.9 6.1*  --   --   --   ALBUMIN 3.9 3.1* 3.1* 3.1* 2.8*   No results for input(s): LIPASE, AMYLASE in the last 168 hours. No results for input(s): AMMONIA in the last 168 hours. CBC:  Recent Labs Lab September 11, 2014 1130  09/11/2014 1253 09/05/14 0335 09/06/14 0500 09/07/14 0400  WBC 19.7*  --  13.2* 6.2 8.4 8.0  NEUTROABS 15.2*  --  11.9*  --   --   --   HGB 11.7*  < >  10.2* 9.0* 9.3* 7.8*  HCT 37.5  < > 32.9* 27.8* 29.7* 25.2*  MCV 86.8  --  86.4 84.5 87.9 86.9  PLT 196  --  136* 92* 132* PENDING  < > = values in this interval not displayed. Cardiac Enzymes: No results for input(s): CKTOTAL, CKMB, CKMBINDEX, TROPONINI in the last 168 hours. CBG:  Recent Labs Lab 09/06/14 2005 09/06/14 2006 09/06/14 2008 09/07/14 0007 09/07/14 0419  GLUCAP 37* 23* 83 104* 102*    Iron Studies: No results for input(s): IRON, TIBC, TRANSFERRIN, FERRITIN in the last 72 hours. Studies/Results: Dg Chest Port 1 View  09/06/2014   CLINICAL DATA:  Respiratory failure.  EXAM: PORTABLE CHEST - 1 VIEW  COMPARISON:  08/28/2014.  FINDINGS: Endotracheal tube and NG tube in stable position. Dialysis catheter stable position. Cardiomegaly with pulmonary vascular prominence and bilateral pulmonary interstitial prominence with small pleural effusions. Findings consistent with congestive heart failure.  No pneumothorax.  IMPRESSION: 1. Lines and tubes including dialysis catheter stable position. 2. Congestive heart failure with bilateral pulmonary interstitial edema and small pleural effusions. Similar findings noted on prior exam.   Electronically Signed   By: Maisie Fus  Register   On: 09/06/2014 08:10   Medications: Infusions: . sodium chloride 1,000 mL (08/19/2014 1153)  . dexmedetomidine 1.2 mcg/kg/hr (09/07/14 0500)  . dextrose 5 % and 0.9% NaCl 40 mL/hr at 09/07/14 0400  . fentaNYL infusion INTRAVENOUS 275 mcg/hr (09/07/14 0400)  . norepinephrine (LEVOPHED) Adult infusion Stopped (09/05/14 0000)    Scheduled Medications: . antiseptic oral rinse  7 mL Mouth Rinse QID  . chlorhexidine  15 mL Mouth Rinse BID  . famotidine (PEPCID) IV  20 mg Intravenous Daily  . heparin subcutaneous  5,000 Units Subcutaneous 3 times per day    have reviewed scheduled and prn medications.  Physical Exam: General: sedated Heart: RRR Lungs: CBS bilat Abdomen: soft, non tender Extremities: no peripheral edema Dialysis Access: PC with erythema at exit site - difficult to gauge tenderness   09/07/2014,6:57 AM  LOS: 3 days

## 2014-09-08 ENCOUNTER — Inpatient Hospital Stay (HOSPITAL_COMMUNITY): Payer: Commercial Managed Care - HMO

## 2014-09-08 DIAGNOSIS — R0600 Dyspnea, unspecified: Secondary | ICD-10-CM

## 2014-09-08 DIAGNOSIS — Z9289 Personal history of other medical treatment: Secondary | ICD-10-CM

## 2014-09-08 DIAGNOSIS — I10 Essential (primary) hypertension: Secondary | ICD-10-CM | POA: Insufficient documentation

## 2014-09-08 LAB — CBC
HCT: 25.5 % — ABNORMAL LOW (ref 36.0–46.0)
HCT: 27.5 % — ABNORMAL LOW (ref 36.0–46.0)
HCT: 27 % — ABNORMAL LOW (ref 36.0–46.0)
HCT: 27.6 % — ABNORMAL LOW (ref 36.0–46.0)
HCT: 28.8 % — ABNORMAL LOW (ref 36.0–46.0)
HEMATOCRIT: 24.2 % — AB (ref 36.0–46.0)
HEMOGLOBIN: 7.9 g/dL — AB (ref 12.0–15.0)
HEMOGLOBIN: 8.8 g/dL — AB (ref 12.0–15.0)
Hemoglobin: 8.1 g/dL — ABNORMAL LOW (ref 12.0–15.0)
Hemoglobin: 8.7 g/dL — ABNORMAL LOW (ref 12.0–15.0)
Hemoglobin: 8.8 g/dL — ABNORMAL LOW (ref 12.0–15.0)
Hemoglobin: 9 g/dL — ABNORMAL LOW (ref 12.0–15.0)
MCH: 27.6 pg (ref 26.0–34.0)
MCH: 27 pg (ref 26.0–34.0)
MCH: 27.4 pg (ref 26.0–34.0)
MCH: 27.7 pg (ref 26.0–34.0)
MCH: 27.5 pg (ref 26.0–34.0)
MCH: 27.1 pg (ref 26.0–34.0)
MCHC: 32.6 g/dL (ref 30.0–36.0)
MCHC: 31.8 g/dL (ref 30.0–36.0)
MCHC: 31.6 g/dL (ref 30.0–36.0)
MCHC: 32.6 g/dL (ref 30.0–36.0)
MCHC: 31.9 g/dL (ref 30.0–36.0)
MCHC: 31.3 g/dL (ref 30.0–36.0)
MCV: 84.6 fL (ref 78.0–100.0)
MCV: 85 fL (ref 78.0–100.0)
MCV: 86.5 fL (ref 78.0–100.0)
MCV: 84.9 fL (ref 78.0–100.0)
MCV: 86.3 fL (ref 78.0–100.0)
MCV: 86.7 fL (ref 78.0–100.0)
PLATELETS: 121 10*3/uL — AB (ref 150–400)
PLATELETS: 130 10*3/uL — AB (ref 150–400)
PLATELETS: 183 10*3/uL (ref 150–400)
Platelets: 133 10*3/uL — ABNORMAL LOW (ref 150–400)
Platelets: 160 10*3/uL (ref 150–400)
Platelets: 168 10*3/uL (ref 150–400)
RBC: 2.86 MIL/uL — ABNORMAL LOW (ref 3.87–5.11)
RBC: 3 MIL/uL — AB (ref 3.87–5.11)
RBC: 3.18 MIL/uL — ABNORMAL LOW (ref 3.87–5.11)
RBC: 3.18 MIL/uL — AB (ref 3.87–5.11)
RBC: 3.2 MIL/uL — AB (ref 3.87–5.11)
RBC: 3.32 MIL/uL — AB (ref 3.87–5.11)
RDW: 19 % — ABNORMAL HIGH (ref 11.5–15.5)
RDW: 19.1 % — ABNORMAL HIGH (ref 11.5–15.5)
RDW: 19.1 % — ABNORMAL HIGH (ref 11.5–15.5)
RDW: 19.3 % — ABNORMAL HIGH (ref 11.5–15.5)
RDW: 19.4 % — ABNORMAL HIGH (ref 11.5–15.5)
RDW: 19.4 % — AB (ref 11.5–15.5)
WBC: 10 10*3/uL (ref 4.0–10.5)
WBC: 9.9 10*3/uL (ref 4.0–10.5)
WBC: 12.2 10*3/uL — AB (ref 4.0–10.5)
WBC: 12.2 10*3/uL — AB (ref 4.0–10.5)
WBC: 12 10*3/uL — AB (ref 4.0–10.5)
WBC: 16.7 10*3/uL — ABNORMAL HIGH (ref 4.0–10.5)

## 2014-09-08 LAB — GLUCOSE, CAPILLARY
GLUCOSE-CAPILLARY: 112 mg/dL — AB (ref 65–99)
GLUCOSE-CAPILLARY: 125 mg/dL — AB (ref 65–99)
GLUCOSE-CAPILLARY: 66 mg/dL (ref 65–99)
GLUCOSE-CAPILLARY: 99 mg/dL (ref 65–99)
Glucose-Capillary: 106 mg/dL — ABNORMAL HIGH (ref 65–99)
Glucose-Capillary: 122 mg/dL — ABNORMAL HIGH (ref 65–99)
Glucose-Capillary: 23 mg/dL — CL (ref 65–99)

## 2014-09-08 LAB — RENAL FUNCTION PANEL
Albumin: 2.8 g/dL — ABNORMAL LOW (ref 3.5–5.0)
Anion gap: 12 (ref 5–15)
BUN: 32 mg/dL — AB (ref 6–20)
CO2: 22 mmol/L (ref 22–32)
Calcium: 8.8 mg/dL — ABNORMAL LOW (ref 8.9–10.3)
Chloride: 106 mmol/L (ref 101–111)
Creatinine, Ser: 4.44 mg/dL — ABNORMAL HIGH (ref 0.44–1.00)
GFR calc non Af Amer: 9 mL/min — ABNORMAL LOW (ref >60)
GFR, EST AFRICAN AMERICAN: 11 mL/min — AB (ref >60)
Glucose, Bld: 122 mg/dL — ABNORMAL HIGH (ref 65–99)
PHOSPHORUS: 7.9 mg/dL — AB (ref 2.5–4.6)
POTASSIUM: 4.5 mmol/L (ref 3.5–5.1)
SODIUM: 140 mmol/L (ref 135–145)

## 2014-09-08 LAB — COMPREHENSIVE METABOLIC PANEL
ALT: 37 U/L (ref 14–54)
AST: 177 U/L — AB (ref 15–41)
Albumin: 2.6 g/dL — ABNORMAL LOW (ref 3.5–5.0)
Alkaline Phosphatase: 70 U/L (ref 38–126)
Anion gap: 13 (ref 5–15)
BILIRUBIN TOTAL: 1.1 mg/dL (ref 0.3–1.2)
BUN: 21 mg/dL — ABNORMAL HIGH (ref 6–20)
CALCIUM: 8.7 mg/dL — AB (ref 8.9–10.3)
CO2: 23 mmol/L (ref 22–32)
Chloride: 103 mmol/L (ref 101–111)
Creatinine, Ser: 3.46 mg/dL — ABNORMAL HIGH (ref 0.44–1.00)
GFR calc Af Amer: 14 mL/min — ABNORMAL LOW (ref >60)
GFR calc non Af Amer: 12 mL/min — ABNORMAL LOW (ref >60)
GLUCOSE: 118 mg/dL — AB (ref 65–99)
POTASSIUM: 4.2 mmol/L (ref 3.5–5.1)
SODIUM: 139 mmol/L (ref 135–145)
Total Protein: 5.6 g/dL — ABNORMAL LOW (ref 6.5–8.1)

## 2014-09-08 MED ORDER — CETYLPYRIDINIUM CHLORIDE 0.05 % MT LIQD
7.0000 mL | Freq: Two times a day (BID) | OROMUCOSAL | Status: DC
  Administered 2014-09-08 – 2014-09-09 (×2): 7 mL via OROMUCOSAL

## 2014-09-08 MED ORDER — LISINOPRIL 20 MG PO TABS
20.0000 mg | ORAL_TABLET | Freq: Two times a day (BID) | ORAL | Status: DC
  Administered 2014-09-08: 20 mg via ORAL
  Filled 2014-09-08 (×3): qty 1

## 2014-09-08 MED ORDER — LABETALOL HCL 100 MG PO TABS
100.0000 mg | ORAL_TABLET | Freq: Two times a day (BID) | ORAL | Status: DC
  Filled 2014-09-08 (×3): qty 1

## 2014-09-08 MED ORDER — ISOSORBIDE MONONITRATE ER 30 MG PO TB24: 30.0000 mg | ORAL_TABLET | Freq: Every day | ORAL | Status: DC

## 2014-09-08 MED ORDER — VANCOMYCIN HCL 500 MG IV SOLR
500.0000 mg | INTRAVENOUS | Status: DC
  Filled 2014-09-08 (×2): qty 500

## 2014-09-08 MED ORDER — CLONIDINE HCL 0.1 MG PO TABS
0.1000 mg | ORAL_TABLET | Freq: Two times a day (BID) | ORAL | Status: DC
  Filled 2014-09-08 (×3): qty 1

## 2014-09-08 MED ORDER — PERFLUTREN LIPID MICROSPHERE
1.0000 mL | INTRAVENOUS | Status: AC | PRN
  Administered 2014-09-08: 2 mL via INTRAVENOUS
  Filled 2014-09-08: qty 10

## 2014-09-08 NOTE — Progress Notes (Signed)
Subjective:  Had HD yest- removed only 1400 due to hypotension- also discovery of a GIB Objective Vital signs in last 24 hours: Filed Vitals:   09/08/14 0400 09/08/14 0500 09/08/14 0600 09/08/14 0700  BP: 111/43 117/38 152/65 117/38  Pulse: 77 69 58   Temp: 99.7 F (37.6 C)     TempSrc: Oral     Resp: 16 16 18 16   Height:      Weight: 56 kg (123 lb 7.3 oz)     SpO2: 100% 100% 100%    Weight change: 6.7 kg (14 lb 12.3 oz)  Intake/Output Summary (Last 24 hours) at 09/08/14 0806 Last data filed at 09/08/14 0600  Gross per 24 hour  Intake 1291.96 ml  Output   1450 ml  Net -158.04 ml    Assessment/Plan: 69 year old black female with hypertension, hyperlipidemia and ESRD. She required intubation for pulmonary edema. And also had hyperkalemia. There also may be a PermCath infection contributing to the clinical Picture  1 respiratory failure secondary to pulmonary edema - required intubation. Were able to get fluid off with CRRT- 6/22-6/24- then IHD 6/25 -CVP mostly single digits-  seems like may be able to extubate soon- weaning sedation 2 ESRD: Normally Monday Wednesday Friday at [facility]- [facility] unit followed by Dr. Louis Meckel via PC (incidently I spoke to Dr. Dyanne Carrel- she is very close to discharging Ms. Offut from her practice due to the same behaviors that led her to be discharged from our practice)-. Required CRRT due to hemodynamic instability- CRRT 6/22-6/24 then IHD 6/25.  Plan for HD in AM to get back on schedule 3. Anemia of ESRD: Hemoglobin 10.2 ---9---7.8  - evidence of GIB- did get heparin with CRRT but stopped on 6/24- have added darbe and  Follow- maybe stabilizing 4. Metabolic Bone Disease: Will monitor calcium and phosphorus while here and start appropriate medications- phos low  5. Possible PermCath infection- blood cultures obtained- negative so far.- only on vanc- WBC down.  Did not require PC removal   Madisynn Plair A    Labs: Basic Metabolic  Panel:  Recent Labs Lab 09/05/14 1557  09/06/14 1600 09/07/14 1600 09/08/14 0428  NA 139  < > 136 138 139  K 5.2*  < > 5.0 3.9 4.2  CL 104  < > 101 102 103  CO2 26  < > 25 25 23   GLUCOSE 101*  < > 347* 99 118*  BUN 19  < > 16 8 21*  CREATININE 3.79*  < > 3.02* 1.96* 3.46*  CALCIUM 8.7*  < > 8.5* 8.5* 8.7*  PHOS 2.6  --  2.9 3.5  --   < > = values in this interval not displayed. Liver Function Tests:  Recent Labs Lab 11-Sep-2014 1130 2014-09-11 1253  09/06/14 1600 09/07/14 1600 09/08/14 0428  AST 37 45*  --   --   --  177*  ALT 13* 12*  --   --   --  37  ALKPHOS 114 90  --   --   --  70  BILITOT 0.9 0.7  --   --   --  1.1  PROT 7.9 6.1*  --   --   --  5.6*  ALBUMIN 3.9 3.1*  < > 2.8* 2.6* 2.6*  < > = values in this interval not displayed. No results for input(s): LIPASE, AMYLASE in the last 168 hours. No results for input(s): AMMONIA in the last 168 hours. CBC:  Recent Labs Lab 11-Sep-2014 1130  21-Sep-2014 1253  09/07/14 0950 09/07/14 1303 09/07/14 1600 09/07/14 2058 09/08/14 0126 09/08/14 0428  WBC 19.7*  --  13.2*  < > 10.4 10.3 11.4* 9.5 10.0 9.9  NEUTROABS 15.2*  --  11.9*  --  6.5  --   --   --   --   --   HGB 11.7*  < > 10.2*  < > 8.6* 8.6* 8.7* 8.3* 7.9* 8.1*  HCT 37.5  < > 32.9*  < > 27.2* 27.0* 27.3* 26.1* 24.2* 25.5*  MCV 86.8  --  86.4  < > 86.6 85.7 85.6 85.6 84.6 85.0  PLT 196  --  136*  < > 119* 123* 147* 127* 121* 133*  < > = values in this interval not displayed. Cardiac Enzymes: No results for input(s): CKTOTAL, CKMB, CKMBINDEX, TROPONINI in the last 168 hours. CBG:  Recent Labs Lab 09/07/14 0819 09/07/14 1254 09/07/14 1637 09/08/14 0113 09/08/14 0419  GLUCAP 117* 93 98 106* 112*    Iron Studies: No results for input(s): IRON, TIBC, TRANSFERRIN, FERRITIN in the last 72 hours. Studies/Results: Dg Chest Port 1 View  09/07/2014   CLINICAL DATA:  Acute respiratory failure  EXAM: PORTABLE CHEST - 1 VIEW  COMPARISON:  09/06/2014  FINDINGS: Mild  cardiomegaly is again identified. A dialysis catheter, endotracheal tube and nasogastric catheter are again seen and stable. Improving aeration is noted bilaterally. Mild vascular congestion remains although the previously seen effusions have improved. This may be in part due to patient positioning. Right arm venous stenting is noted.  IMPRESSION: Improved aeration bilaterally. This may be in part due to patient positioning.  Tubes and lines as described.   Electronically Signed   By: Alcide Clever M.D.   On: 09/07/2014 07:43   Medications: Infusions: . sodium chloride 1,000 mL (09-21-14 1153)  . dexmedetomidine 0.8 mcg/kg/hr (09/08/14 0551)  . dextrose 5 % and 0.9% NaCl 40 mL/hr at 09/07/14 0400  . fentaNYL infusion INTRAVENOUS 300 mcg/hr (09/08/14 0400)    Scheduled Medications: . antiseptic oral rinse  7 mL Mouth Rinse QID  . chlorhexidine  15 mL Mouth Rinse BID  . famotidine (PEPCID) IV  20 mg Intravenous Daily    have reviewed scheduled and prn medications.  Physical Exam: General: sedated Heart: RRR Lungs: CBS bilat Abdomen: soft, non tender Extremities: no peripheral edema Dialysis Access: PC with erythema at exit site - difficult to gauge tenderness   09/08/2014,8:06 AM  LOS: 4 days

## 2014-09-08 NOTE — Progress Notes (Addendum)
[facility] PULMONARY / CRITICAL CARE MEDICINE   Name: Alexandra [name] MRN: [id number] DOB: May 20, 1945    ADMISSION DATE:  09/01/2014   REFERRING MD :  EDP  CHIEF COMPLAINT:  SOB  INITIAL PRESENTATION: SOB on HD day  STUDIES:    SIGNIFICANT EVENTS: 6/22 intubated in er 6/25- rectal bleeding noted, HD   HISTORY OF PRESENT ILLNESS:   [name] , ESRD with Left I J tunneled HD cath, who presents with increased SOB prior to dialysis planned for today(6/22) and while in ED required intubation for increased SOB and resp failure. Very little information available at time of admission other than ESRD, WBC is 19 therefore will admit and treat empirically with abx , note her HD cath looks inflamed.  We will consult renal for hemodialysis as we note K+ of 7.6. Further information will be added as available.   SUBJECTIVE:  Passed SBT   VITAL SIGNS: Temp:  [97 F (36.1 C)-99.8 F (37.7 C)] 97.9 F (36.6 C) (06/26 1217) Pulse Rate:  [48-127] 92 (06/26 1300) Resp:  [9-25] 25 (06/26 1300) BP: (106-166)/(38-95) 149/43 mmHg (06/26 1300) SpO2:  [58 %-100 %] 87 % (06/26 1300) FiO2 (%):  [40 %] 40 % (06/26 1217) Weight:  [56 kg (123 lb 7.3 oz)-59.8 kg (131 lb 13.4 oz)] 56 kg (123 lb 7.3 oz) (06/26 0400) HEMODYNAMICS: CVP:  [7 mmHg-8 mmHg] 8 mmHg VENTILATOR SETTINGS: Vent Mode:  [-] CPAP;PSV FiO2 (%):  [40 %] 40 % Set Rate:  [16 bmp] 16 bmp Vt Set:  [530 mL] 530 mL PEEP:  [5 cmH20] 5 cmH20 Pressure Support:  [10 cmH20] 10 cmH20 Plateau Pressure:  [20 cmH20-27 cmH20] 23 cmH20 INTAKE / OUTPUT:  Intake/Output Summary (Last 24 hours) at 09/08/14 1416 Last data filed at 09/08/14 1100  Gross per 24 hour  Intake 815.06 ml  Output   1550 ml  Net -734.94 ml    PHYSICAL EXAMINATION: General:  AAF awake, follows commands Neuro:  Awake, RASS 0 HEENT:  Rt EJ IV noted, jvd improved  Cardiovascular:  HSR RRR; III/VI SEM  Lungs: clear  Abdomen:  Soft + bs Musculoskeletal:  intact Skin:  Warm and  dry  LABS:  CBC  Recent Labs Lab 09/08/14 0428 09/08/14 0930 09/08/14 1215  WBC 9.9 12.2* 12.2*  HGB 8.1* 8.7* 8.8*  HCT 25.5* 27.5* 27.0*  PLT 133* 160 130*   Coag's  Recent Labs Lab 08/20/2014 1253 09/05/14 0335 09/06/14 0500 09/07/14 0950  APTT 24 142* >200* 34  INR 1.06  --   --  1.34   BMET  Recent Labs Lab 09/06/14 1600 09/07/14 1600 09/08/14 0428  NA 136 138 139  K 5.0 3.9 4.2  CL 101 102 103  CO2 25 25 23   BUN 16 8 21*  CREATININE 3.02* 1.96* 3.46*  GLUCOSE 347* 99 118*   Electrolytes  Recent Labs Lab 09/05/14 0335 09/05/14 1557  09/06/14 1600 09/07/14 1600 09/08/14 0428  CALCIUM 8.6* 8.7*  < > 8.5* 8.5* 8.7*  MG 2.3  --   --   --   --   --   PHOS 3.0 2.6  --  2.9 3.5  --   < > = values in this interval not displayed. Sepsis Markers  Recent Labs Lab 09/06/2014 1529 08/20/2014 2000 09/05/14 0340  LATICACIDVEN 6.1* 4.8* 3.0*   ABG  Recent Labs Lab 09/07/2014 1308 08/17/2014 1630  PHART 7.322* 7.316*  PCO2ART 35.7 32.0*  PO2ART 182.0* 170*   Liver  Enzymes  Recent Labs Lab 09-07-14 1130 09-07-14 1253  09/06/14 1600 09/07/14 1600 09/08/14 0428  AST 37 45*  --   --   --  177*  ALT 13* 12*  --   --   --  37  ALKPHOS 114 90  --   --   --  70  BILITOT 0.9 0.7  --   --   --  1.1  ALBUMIN 3.9 3.1*  < > 2.8* 2.6* 2.6*  < > = values in this interval not displayed. Cardiac Enzymes No results for input(s): TROPONINI, PROBNP in the last 168 hours. Glucose  Recent Labs Lab 09/07/14 1637 09/08/14 0113 09/08/14 0419 09/08/14 0801 09/08/14 1209 09/08/14 1215  GLUCAP 98 106* 112* 125* 66 122*    Imaging No results found.   ASSESSMENT / PLAN:  PULMONARY OETT 6/22>>6/26 A: VDRF secondary to fluid overload Passed SBT-->extubated  P:   Wean FIO2 pulm hygiene   CARDIOVASCULAR CVL left i j tunneled hd cath Femoral cvl 6/22 >> A:  HTN Decompensated systolic and diastolic HF Pulmonary edema  P:  Resolved MAP Tele Volume  removal w/ HD, planned for 6/27 Resume her home antihypertensives   RENAL A:  ESRD P:   HD 6/26 Chem in am   GASTROINTESTINAL A:  GI protection, rectal bleeding P:   PPI See heme Ensure has stool softeners Start diet later today (6/26)  HEMATOLOGIC A:  Anemia of chronic dz Rectal bleeding P:  Transfuse for Hb<7 scd Cont PPI  INFECTIOUS A:  presumed infection with HD cath presumed source Remains culture neg P:   BCx2 6/22>> UC 6/22>>neg Sputum6/22>> oral flora Abx:  6/22 vanc>> 6/22 pip-tazo>> 6/24 May need permacth pulled if positive blood cx, neg thus far and clinically improved  ENDOCRINE A:   Hyperglycemia P:   SSI  NEUROLOGIC A:   Agitation P:   Rass goal 0   FAMILY  - Updates: None at bedside  - Inter-disciplinary family meet or Palliative Care meeting due by:  day 7  Extubated. Add back antihypertensives. Likely HD tomorrow if she doesn't s/o AMA.   Simonne Martinet ACNP-BC [facility]/Critical Care Pager # [phone] OR # [phone] if no answer   STAFF NOTE: I, Rory Percy, MD FACP have personally reviewed patient's available data, including medical history, events of note, physical examination and test results as part of my evaluation. I have discussed with resident/NP and other care providers such as pharmacist, RN and RRT. In addition, I personally evaluated patient and elicited key findings of: clearer lungs, pcxr improved after HD, wean cpap 5 ps 5, goal 1 hr, assess rsbi, extubated successfully thus far, for HD again in am , start clonidine as BP rising and avoid rebound, start home ACEI also, no evidence infection, infiltrates cleared with neg balance, ensure off all abx, diet likely early, awake, echo reviewed, no sig effusion, EF noted The patient is critically ill with multiple organ systems failure and requires high complexity decision making for assessment and support, frequent evaluation and titration of therapies,  application of advanced monitoring technologies and extensive interpretation of multiple databases.   Critical Care Time devoted to patient care services described in this note is30 Minutes. This time reflects time of care of this signee: Rory Percy, MD FACP. This critical care time does not reflect procedure time, or teaching time or supervisory time of PA/NP/Med student/Med Resident etc but could involve care discussion time. Rest per NP/medical resident whose note is outlined  above and that I agree with   Mcarthur Rossetti. Tyson Alias, MD, FACP Pgr: [phone] [facility] 09/08/2014 2:36 PM

## 2014-09-08 NOTE — Procedures (Signed)
Extubation Procedure Note  Patient Details:   Name: Alexandra [name] DOB: 02-29-1946 MRN: [id number]   Airway Documentation:  Airway 7.5 mm (Active)  Secured at (cm) 21 cm 09/08/2014 12:20 PM  Measured From Lips 09/08/2014 12:20 PM  Secured Location Right 09/08/2014 12:20 PM  Secured By [employer] 09/08/2014 12:20 PM  Tube Holder Repositioned Yes 09/08/2014  8:05 AM  Cuff Pressure (cm H2O) 25 cm H2O 09/07/2014  7:00 PM  Site Condition Dry 09/08/2014 12:20 PM    Evaluation  O2 sats: stable throughout Complications: No apparent complications Patient did tolerate procedure well. Bilateral Breath Sounds: Clear, Diminished Suctioning: Airway Yes   Pt extubated per MD order.  Pt placed on 4l  with sats of 100%.  RT will continue to monitor.  Ronny Flurry 09/08/2014, 2:27 PM

## 2014-09-08 NOTE — Progress Notes (Addendum)
  Echocardiogram 2D Echocardiogram with Definity has been performed.  Alexandra [name] 09/08/2014, 11:04 AM

## 2014-09-08 NOTE — Progress Notes (Signed)
ANTIBIOTIC CONSULT NOTE - Follow-up  Pharmacy Consult for Vancomycin  Indication: Bacteremia  Allergies  Allergen Reactions  . Codeine   . Dextrans   . Epinephrine   . Iron   . Niacin And Related   . Nifedipine   . Percocet [Oxycodone-Acetaminophen]   . Statins     Patient Measurements: Height: 6' (182.9 cm) Weight: 123 lb 7.3 oz (56 kg) IBW/kg (Calculated) : 73.1 Adjusted Body Weight:   Vital Signs: Temp: 97.6 F (36.4 C) (06/26 0807) Temp Source: Oral (06/26 0807) BP: 119/95 mmHg (06/26 0805) Pulse Rate: 118 (06/26 0805) Intake/Output from previous day: 06/25 0701 - 06/26 0700 In: 1418.3 [I.V.:1313.3; IV Piggyback:105] Out: 1450 [Emesis/NG output:50] Intake/Output from this shift: Total I/O In: 55.6 [I.V.:25.6; NG/GT:30] Out: 100 [Emesis/NG output:100]  Labs:  Recent Labs  09/06/14 1600  09/07/14 1600  09/08/14 0126 09/08/14 0428 09/08/14 0930  WBC  --   < > 11.4*  < > 10.0 9.9 12.2*  HGB  --   < > 8.7*  < > 7.9* 8.1* 8.7*  PLT  --   < > 147*  < > 121* 133* 160  CREATININE 3.02*  --  1.96*  --   --  3.46*  --   < > = values in this interval not displayed. Estimated Creatinine Clearance: 13.2 mL/min (by C-G formula based on Cr of 3.46).  Recent Labs  09/06/14 1530  VANCORANDOM 16     Microbiology: Recent Results (from the past 720 hour(s))  Culture, blood (routine x 2)     Status: None (Preliminary result)   Collection Time: 09/14/14 12:51 PM  Result Value Ref Range Status   Specimen Description BLOOD RIGHT  Final   Special Requests   Final    BOTTLES DRAWN AEROBIC AND ANAEROBIC 5CC RIGHT FEM LINE   Culture NO GROWTH 3 DAYS  Final   Report Status PENDING  Incomplete  Culture, respiratory (NON-Expectorated)     Status: None   Collection Time: September 14, 2014 12:53 PM  Result Value Ref Range Status   Specimen Description TRACHEAL ASPIRATE  Final   Special Requests NONE  Final   Gram Stain   Final    RARE WBC PRESENT, PREDOMINANTLY PMN NO SQUAMOUS  EPITHELIAL CELLS SEEN RARE GRAM POSITIVE COCCI IN PAIRS Performed at [facility]    Culture   Final    Non-Pathogenic Oropharyngeal-type Flora Isolated. Performed at [facility]    Report Status 09/06/2014 FINAL  Final  Culture, blood (routine x 2)     Status: None (Preliminary result)   Collection Time: 14-Sep-2014  1:00 PM  Result Value Ref Range Status   Specimen Description BLOOD LEFT ARM  Final   Special Requests BOTTLES DRAWN AEROBIC AND ANAEROBIC 5CC  Final   Culture NO GROWTH 3 DAYS  Final   Report Status PENDING  Incomplete  MRSA PCR Screening     Status: None   Collection Time: 2014/09/14  2:10 PM  Result Value Ref Range Status   MRSA by PCR NEGATIVE NEGATIVE Final    Comment:        The GeneXpert MRSA Assay (FDA approved for NASAL specimens only), is one component of a comprehensive MRSA colonization surveillance program. It is not intended to diagnose MRSA infection nor to guide or monitor treatment for MRSA infections.    Assessment: 69yo female ESRD-MWF HD who presents to ED with SOB. Started on vancomycin + zosyn for treatment of sepsis now on vancomycin alone. Previously on  CRRT but transitioned back to HD yesterday. Planning repeat HD tomorrow to resume PTA regimen of HD MWF. Pt is afebrile and WBC is WNL.  6/24: VR = 16  Zosyn 6/22>>6/24 Vanc 6/22>>  6/22 TA - NPOF 6/22 Blood - NGTD  Goal of Therapy:  pre-HD level 15-25  Plan:  - Vancomycin 500mg  post-HD MWF - F/u renal plans, C&S, clinical status and pre-HD level when appropriat - MD - please evaluate planned LOT  Lysle Pearl, PharmD, BCPS Pager # [phone] 09/08/2014 10:46 AM

## 2014-09-09 ENCOUNTER — Ambulatory Visit (HOSPITAL_COMMUNITY): Payer: Commercial Managed Care - HMO

## 2014-09-09 ENCOUNTER — Inpatient Hospital Stay (HOSPITAL_COMMUNITY): Payer: Commercial Managed Care - HMO

## 2014-09-09 DIAGNOSIS — I469 Cardiac arrest, cause unspecified: Secondary | ICD-10-CM | POA: Diagnosis not present

## 2014-09-09 DIAGNOSIS — G9340 Encephalopathy, unspecified: Secondary | ICD-10-CM | POA: Insufficient documentation

## 2014-09-09 LAB — CBC
HCT: 29 % — ABNORMAL LOW (ref 36.0–46.0)
HEMATOCRIT: 29.8 % — AB (ref 36.0–46.0)
HEMATOCRIT: 29.6 % — AB (ref 36.0–46.0)
HEMATOCRIT: 24 % — AB (ref 36.0–46.0)
HEMOGLOBIN: 9.2 g/dL — AB (ref 12.0–15.0)
Hemoglobin: 9.2 g/dL — ABNORMAL LOW (ref 12.0–15.0)
Hemoglobin: 9.3 g/dL — ABNORMAL LOW (ref 12.0–15.0)
Hemoglobin: 7.6 g/dL — ABNORMAL LOW (ref 12.0–15.0)
MCH: 26.7 pg (ref 26.0–34.0)
MCH: 27.3 pg (ref 26.0–34.0)
MCH: 27.6 pg (ref 26.0–34.0)
MCH: 27.8 pg (ref 26.0–34.0)
MCHC: 30.9 g/dL (ref 30.0–36.0)
MCHC: 31.4 g/dL (ref 30.0–36.0)
MCHC: 31.7 g/dL (ref 30.0–36.0)
MCHC: 31.7 g/dL (ref 30.0–36.0)
MCV: 86.3 fL (ref 78.0–100.0)
MCV: 86.4 fL (ref 78.0–100.0)
MCV: 87.6 fL (ref 78.0–100.0)
MCV: 87.8 fL (ref 78.0–100.0)
PLATELETS: 194 10*3/uL (ref 150–400)
Platelets: 173 10*3/uL (ref 150–400)
Platelets: 179 10*3/uL (ref 150–400)
Platelets: DECREASED 10*3/uL (ref 150–400)
RBC: 2.78 MIL/uL — ABNORMAL LOW (ref 3.87–5.11)
RBC: 3.31 MIL/uL — ABNORMAL LOW (ref 3.87–5.11)
RBC: 3.37 MIL/uL — ABNORMAL LOW (ref 3.87–5.11)
RBC: 3.45 MIL/uL — ABNORMAL LOW (ref 3.87–5.11)
RDW: 19.8 % — ABNORMAL HIGH (ref 11.5–15.5)
RDW: 19.8 % — ABNORMAL HIGH (ref 11.5–15.5)
RDW: 19.6 % — AB (ref 11.5–15.5)
RDW: 19.5 % — ABNORMAL HIGH (ref 11.5–15.5)
WBC: 22.3 10*3/uL — ABNORMAL HIGH (ref 4.0–10.5)
WBC: 20.7 10*3/uL — ABNORMAL HIGH (ref 4.0–10.5)
WBC: 21.9 10*3/uL — AB (ref 4.0–10.5)
WBC: 15.2 10*3/uL — ABNORMAL HIGH (ref 4.0–10.5)

## 2014-09-09 LAB — GLUCOSE, CAPILLARY
GLUCOSE-CAPILLARY: 116 mg/dL — AB (ref 65–99)
GLUCOSE-CAPILLARY: 123 mg/dL — AB (ref 65–99)
GLUCOSE-CAPILLARY: 91 mg/dL (ref 65–99)
Glucose-Capillary: 37 mg/dL — CL (ref 65–99)
Glucose-Capillary: 106 mg/dL — ABNORMAL HIGH (ref 65–99)

## 2014-09-09 LAB — BASIC METABOLIC PANEL
ANION GAP: 17 — AB (ref 5–15)
BUN: 54 mg/dL — AB (ref 6–20)
CALCIUM: 8.3 mg/dL — AB (ref 8.9–10.3)
CHLORIDE: 106 mmol/L (ref 101–111)
CO2: 19 mmol/L — ABNORMAL LOW (ref 22–32)
CREATININE: 6.12 mg/dL — AB (ref 0.44–1.00)
GFR calc Af Amer: 7 mL/min — ABNORMAL LOW (ref >60)
GFR calc non Af Amer: 6 mL/min — ABNORMAL LOW (ref >60)
Glucose, Bld: 141 mg/dL — ABNORMAL HIGH (ref 65–99)
Potassium: 5.2 mmol/L — ABNORMAL HIGH (ref 3.5–5.1)
Sodium: 142 mmol/L (ref 135–145)

## 2014-09-09 LAB — CBC WITH DIFFERENTIAL/PLATELET
BASOS PCT: 0 % (ref 0–1)
Basophils Absolute: 0 10*3/uL (ref 0.0–0.1)
EOS ABS: 0 10*3/uL (ref 0.0–0.7)
EOS PCT: 0 % (ref 0–5)
HCT: 23.6 % — ABNORMAL LOW (ref 36.0–46.0)
HEMOGLOBIN: 7.9 g/dL — AB (ref 12.0–15.0)
LYMPHS ABS: 2.4 10*3/uL (ref 0.7–4.0)
LYMPHS PCT: 17 % (ref 12–46)
MCH: 27.9 pg (ref 26.0–34.0)
MCHC: 33.5 g/dL (ref 30.0–36.0)
MCV: 83.4 fL (ref 78.0–100.0)
MONO ABS: 2.1 10*3/uL — AB (ref 0.1–1.0)
Monocytes Relative: 15 % — ABNORMAL HIGH (ref 3–12)
Neutro Abs: 9.7 10*3/uL — ABNORMAL HIGH (ref 1.7–7.7)
Neutrophils Relative %: 68 % (ref 43–77)
Platelets: DECREASED 10*3/uL (ref 150–400)
RBC: 2.83 MIL/uL — AB (ref 3.87–5.11)
RDW: 19 % — ABNORMAL HIGH (ref 11.5–15.5)
Smear Review: DECREASED
WBC: 14.2 10*3/uL — ABNORMAL HIGH (ref 4.0–10.5)

## 2014-09-09 LAB — RENAL FUNCTION PANEL
ANION GAP: 13 (ref 5–15)
Albumin: 1.8 g/dL — ABNORMAL LOW (ref 3.5–5.0)
BUN: 15 mg/dL (ref 6–20)
CO2: 41 mmol/L — ABNORMAL HIGH (ref 22–32)
CREATININE: 2.11 mg/dL — AB (ref 0.44–1.00)
Chloride: 105 mmol/L (ref 101–111)
GFR calc Af Amer: 26 mL/min — ABNORMAL LOW (ref >60)
GFR calc non Af Amer: 22 mL/min — ABNORMAL LOW (ref >60)
GLUCOSE: 27 mg/dL — AB (ref 65–99)
Phosphorus: 9.3 mg/dL — ABNORMAL HIGH (ref 2.5–4.6)
Potassium: >7.5 mmol/L (ref 3.5–5.1)
Sodium: 159 mmol/L — ABNORMAL HIGH (ref 135–145)

## 2014-09-09 LAB — CULTURE, BLOOD (ROUTINE X 2)
Culture: NO GROWTH
Culture: NO GROWTH

## 2014-09-09 LAB — TROPONIN I: Troponin I: 54.1 ng/mL (ref <0.031)

## 2014-09-09 LAB — MAGNESIUM: Magnesium: 7.7 mg/dL (ref 1.7–2.4)

## 2014-09-09 LAB — LACTIC ACID, PLASMA: LACTIC ACID, VENOUS: 13 mmol/L — AB (ref 0.5–2.0)

## 2014-09-09 MED ORDER — DEXTROSE 50 % IV SOLN
INTRAVENOUS | Status: AC
  Filled 2014-09-09: qty 50

## 2014-09-09 MED ORDER — AMIODARONE HCL IN DEXTROSE 360-4.14 MG/200ML-% IV SOLN
60.0000 mg/h | INTRAVENOUS | Status: DC
  Filled 2014-09-09: qty 200

## 2014-09-09 MED ORDER — HEPARIN SODIUM (PORCINE) 1000 UNIT/ML DIALYSIS: 20.0000 [IU]/kg | INTRAMUSCULAR | Status: DC | PRN

## 2014-09-09 MED ORDER — AMIODARONE HCL IN DEXTROSE 360-4.14 MG/200ML-% IV SOLN
30.0000 mg/h | INTRAVENOUS | Status: DC
  Filled 2014-09-09: qty 200

## 2014-09-10 LAB — COMPREHENSIVE METABOLIC PANEL
ALT: 466 U/L — AB (ref 14–54)
AST: 1892 U/L — ABNORMAL HIGH (ref 15–41)
Albumin: 1.7 g/dL — ABNORMAL LOW (ref 3.5–5.0)
Alkaline Phosphatase: 76 U/L (ref 38–126)
BUN: 11 mg/dL (ref 6–20)
CALCIUM: 13 mg/dL — AB (ref 8.9–10.3)
CREATININE: 2.02 mg/dL — AB (ref 0.44–1.00)
Chloride: 99 mmol/L — ABNORMAL LOW (ref 101–111)
GFR, EST AFRICAN AMERICAN: 27 mL/min — AB (ref >60)
GFR, EST NON AFRICAN AMERICAN: 24 mL/min — AB (ref >60)
GLUCOSE: 27 mg/dL — AB (ref 65–99)
Potassium: >7.5 mmol/L (ref 3.5–5.1)
Sodium: >180 mmol/L (ref 135–145)
Total Bilirubin: 1.3 mg/dL — ABNORMAL HIGH (ref 0.3–1.2)
Total Protein: 3.6 g/dL — ABNORMAL LOW (ref 6.5–8.1)

## 2014-09-10 LAB — GLUCOSE, CAPILLARY
GLUCOSE-CAPILLARY: 13 mg/dL — AB (ref 65–99)
GLUCOSE-CAPILLARY: 49 mg/dL — AB (ref 65–99)
Glucose-Capillary: <10 mg/dL — CL (ref 65–99)
Glucose-Capillary: <10 mg/dL — CL (ref 65–99)

## 2014-09-10 MED FILL — Medication: Qty: 1 | Status: AC

## 2014-09-10 NOTE — Progress Notes (Signed)
CRITICAL VALUE ALERT  Critical value received:  Lactic Acid 13.0  Date of notification:  08/20/2014  Time of notification:  1910  Critical value read back:  Yes   Nurse who received alert:  Earle GellVonshell Rosely Fernandez, RN    MD notified (1st page):  Patient deceased  Time of first page:  00  MD notified (2nd page):   Time of second page:  Responding MD:    Time MD responded:

## 2014-09-10 NOTE — Progress Notes (Signed)
CRITICAL VALUE ALERT  Critical value received:  Glucose 27, K + 7.5 CO2 >50, Sodium >180, MG 7.7  Date of notification:  08/24/2014  Time of notification:  2034  Critical value read back: yes  Nurse who received alert:  Earle GellVonshell Betsey Sossamon   MD notified (1st page):  Patient deceased  Time of first page:  *00  MD notified (2nd page):  Time of second page:  Responding MD:   Time MD responded:

## 2014-09-10 NOTE — Accreditation Note (Signed)
o Restraints not reported to CMS Pursuant to regulation 482.13 (G) (3) use of soft wrist restraints was logged on 06.28.2016 at 0727 by Rosine DoorAngus Kiosha Buchan, RN, Wellsite geologistAccreditation and Safety Coordinator.

## 2014-09-11 ENCOUNTER — Telehealth: Payer: Self-pay

## 2014-09-11 NOTE — Telephone Encounter (Signed)
Received death certificate from [facility] 09-11-14 for cremation.  Will courier to Dr. Tyson AliasFeinstein @2H  for signature.  Not signed by Dr. Tyson AliasFeinstein.  Picked up and took upstairs to Dr. Evlyn CourierSood's office for signature per his request and will be in this afternoon to sign.  Received back 09-13-14.  Called [facility] for pick-up and faxed copy.

## 2014-09-12 ENCOUNTER — Other Ambulatory Visit: Payer: Self-pay | Admitting: *Deleted

## 2014-09-12 ENCOUNTER — Encounter: Payer: Self-pay | Admitting: *Deleted

## 2014-09-12 NOTE — Patient Outreach (Signed)
[facility]) Care Management  [facility] Social Work  09/12/2014  Jola Critzer Offutt 17-May-1945 [id number]   Current Medications:  Current Outpatient Prescriptions  Medication Sig Dispense Refill  . amLODipine (NORVASC) 10 MG tablet Take 1 tablet (10 mg total) by mouth at bedtime. (Patient not taking: Reported on 08/26/2014) 30 tablet 0  . B Complex-C-Folic Acid (RENAL VITAMIN PO) Take 1 tablet by mouth daily.    . benzonatate (TESSALON) 100 MG capsule Take 1 capsule (100 mg total) by mouth 3 (three) times daily as needed for cough. 20 capsule 0  . calcium carbonate (TUMS - DOSED IN MG ELEMENTAL CALCIUM) 500 MG chewable tablet Chew 1 tablet by mouth 3 (three) times daily before meals.    . cloNIDine (CATAPRES) 0.2 MG tablet Take 0.2 mg by mouth 2 (two) times daily.    . Coenzyme Q10 (COQ10) 100 MG CAPS Take 100-300 mg by mouth daily.    Marland Kitchen docusate sodium (COLACE) 100 MG capsule Take 200 mg by mouth at bedtime.      Marland Kitchen doxazosin (CARDURA) 4 MG tablet Take 4 mg by mouth daily.    . hydrALAZINE (APRESOLINE) 100 MG tablet Take 100 mg by mouth 3 (three) times daily.    . isosorbide dinitrate (ISORDIL) 10 MG tablet Take 1 tablet (10 mg total) by mouth 2 (two) times daily. 60 tablet 0  . labetalol (NORMODYNE) 100 MG tablet Take 1 tablet (100 mg total) by mouth 3 (three) times daily. (Patient not taking: Reported on 08/26/2014) 90 tablet 0  . lisinopril (PRINIVIL,ZESTRIL) 20 MG tablet Take 20 mg by mouth 2 (two) times daily.     No current facility-administered medications for this visit.    Functional Status:  In your present state of health, do you have any difficulty performing the following activities: 09/12/2014 04/12/2014  Hearing? N N  Vision? N N  Difficulty concentrating or making decisions? N N  Walking or climbing stairs? Y Y  Dressing or bathing? N N  Doing errands, shopping? Y N  Preparing Food and eating ? N -  Using the Toilet? N -  In the past six months, have  you accidently leaked urine? N -  Do you have problems with loss of bowel control? N -  Managing your Medications? N -  Managing your Finances? N -  Housekeeping or managing your Housekeeping? Y -    Fall/Depression Screening:  PHQ 2/9 Scores 09/12/2014  PHQ - 2 Score 0  Exception Documentation Patient refusal    Assessment:   CSW was able to make initial contact with patient today to perform brief phone assessment, as well as assess and assist with social work needs and services.  CSW introduced self, explained role and types of services provided through [employer].  CSW further explained to patient that CSW works with patient's RNCM, also with [place] [facility], Thea Silversmith, as patient appeared to be somewhat guarded with CSW. After much explanation, CSW was able to obtain two HIPAA compliant identifiers from patient, which included patient's name and date of birth.  CSW then explained the reason for the call, indicating that CSW was aware that patient is interested in receiving PCS ([place]) in the home. Patient admitted that she was recently approved for a bath aide and that the lady will be starting within the next week.  The bath aide will be able to provide services for patient several times per week.  The bath  aide is contracted with [place], home health agency that is also providing nursing and physical therapy services to patient.  Patient indicated that she is now in need of someone to come out to clean her home, at least one time per month, but that she is unable to pay for the expense out-of-pocket.  CSW explained to patient that unless she has full Adult Medicaid coverage, she will be responsible for paying for PCS services out-of-pocket.  Patient did not like hearing this, becoming upset with CSW's response.   CSW proceeded to converse with patient about all the home care agencies that only accept private  pay or full Adult Medicaid coverage.  These agencies included:  PCS through [employer], [employer] ([place] and [employer]) through the Department of [place] and CAPS (Community Alternatives Program) through the [place].  CSW then inquired as to whether or not patient would be interested in CSW assisting her with completion of an Adult Medicaid application.  Patient denied, indicating that she would not qualify, as she makes too much money per year in [place] and she currently has too many assets.  Patient indicated that she is aware of her over-qualification because she has already tried to apply.  Patient refused to provide CSW with yearly [place] so that CSW could ensure that patient exceeds Poverty Guidelines for the [place] of [place] for fiscal year 2015/2016. Patient was not pleasant with CSW during the rest of the conversation, believing that CSW was aware of community agencies and resources that could offer her free assistance in the home, but not providing her with the information.  CSW apologized to patient for the inconvenience, inquiring as to how CSW could be of further assistance to patient at this time.  Patient denied being able to identify any additional social work needs of CSW, terminating the call.  CSW will close patient's case, as patient is no longer interested in conversing with CSW.  Plan:    CSW will no longer make arrangements to contact patient by phone or in person, as all goals of treatment have been met from social work standpoint and no additional social work needs have been identified at this time. CSW will notify patients RNCM with [place] [facility], Thea Silversmith of CSW's plans to close patients case. CSW will submit a case closure request to Lurline Del, Care Management Assistant with [facility] [facility], in the  form of an [employer]. CSW will fax a correspondence letter to patient's Primary Care Physician, Dr. Lona Kettle to report social work involvement with patient.  Nat Christen, BSW, MSW, [facility] [place], [place] [address] [email or web address] [phone]

## 2014-09-13 ENCOUNTER — Telehealth: Payer: Self-pay

## 2014-09-13 NOTE — Progress Notes (Addendum)
Hemodialysis: While holding needle sites on another patient I notice that the Alexandra [name] s heart started to decrease gradually on the cardiac monitor. I moved toward Alexandra. [name] to verify heart rhythm. Notice heart falling from low 60's to mid 50's Call out the patient's name several times and [name] came over to see the patient when he heard me calling out the patient's name. Notice patient respiratory rate started falling with a heart rate still of mid 50"s Sternum rub initiated by myself and then by Lenny Pastelavid Zeyfang PA. Patient became unresponsive. Attempted to returned blood which it was partially returned. Patient was placed in [place] position and CPR was initiated. Code Blue was called .  When patient became unresponsive she still had a heart rate of mid 50"s .   Code blue was called and responded at approximately 16:40 Attending MD at bedside, code called at 17:16 by   Dr. Delton CoombesByrum

## 2014-09-13 NOTE — Care Management (Signed)
Important Message  Patient Details  Name: Alexandra [name] MRN: [id number] Date of Birth: 08/11/1945   Medicare Important Message Given:  Yes-second notification given    Kyla BalzarineShealy, Shiquita Collignon [place] 01-08-2015, 4:12 PM

## 2014-09-13 NOTE — Telephone Encounter (Signed)
Error

## 2014-09-13 NOTE — Progress Notes (Signed)
Central femoral line will be removed by the 2S nurse. Alexandra [name], Layman Gully M

## 2014-09-13 NOTE — Progress Notes (Signed)
Assessment/Plan: 69 year old black female with hypertension, hyperlipidemia and ESRD. She required intubation for pulmonary edema. And also had hyperkalemia. There also may be a PermCath infection contributing to the clinical Picture  1 respiratory failure secondary to pulmonary edema - required intubation. Were able to get fluid off with CRRT- 6/22-6/24- then IHD 6/25 -CVP mostly single digits- seems like may be able to extubate soon- weaning sedation 2 ESRD: Normally Monday Wednesday Friday at [facility]- [facility] unit followed by Dr. Louis Meckel via [facility].  Required CRRT due to hemodynamic instability- CRRT 6/22-6/24 then IHD 6/25. Plan for HD today to get back on schedule 3. Anemia of ESRD: - evidence of GIB- did get heparin with CRRT but stopped on 6/24- have added darbe  4. Metabolic Bone Disease: Will monitor calcium and phosphorus while here and start appropriate medications- phos low  5. Possible PermCath infection- blood cultures obtained- negative so far.- only on vanc- WBC el   Objective: Vital signs in last 24 hours: Temp:  [97.1 F (36.2 C)-98.1 F (36.7 C)] 97.1 F (36.2 C) (06/27 1224) Pulse Rate:  [89-120] 89 (06/27 1000) Resp:  [8-22] 16 (06/27 1000) BP: (133-167)/(44-60) 155/53 mmHg (06/27 1000) SpO2:  [93 %-100 %] 99 % (06/27 1000) Weight:  [58.3 kg (128 lb 8.5 oz)] 58.3 kg (128 lb 8.5 oz) (06/27 0500) Weight change: -4.2 kg (-9 lb 4.2 oz)  Intake/Output from previous day: 06/26 0701 - 06/27 0700 In: 1596.8 [I.V.:1516.8; NG/GT:30; IV Piggyback:50] Out: 100 [Emesis/NG output:100] Intake/Output this shift: Total I/O In: 80 [I.V.:80] Out: -   General appearance: awake but not conversing Resp: clear to auscultation bilaterally Cardio: regular rate and rhythm, S1, S2 normal, no murmur, click, rub or gallop Extremities: extremities normal, atraumatic, no cyanosis or edema  Lab Results:  Recent Labs  09/08/2014 0430 08/28/2014 0910  WBC 22.3* 20.7*  HGB 9.2* 9.3*  HCT  29.8* 29.6*  PLT 194 173   BMET:  Recent Labs  09/08/14 1547 09/11/2014 1035  NA 140 142  K 4.5 5.2*  CL 106 106  CO2 22 19*  GLUCOSE 122* 141*  BUN 32* 54*  CREATININE 4.44* 6.12*  CALCIUM 8.8* 8.3*   No results for input(s): PTH in the last 72 hours. Iron Studies: No results for input(s): IRON, TIBC, TRANSFERRIN, FERRITIN in the last 72 hours. Studies/Results: Ct Head Wo Contrast  09/12/2014   CLINICAL DATA:  Initial encounter for acute encephalopathy with acute aphasia and right upper extremity right lower extremity weakness.  EXAM: CT HEAD WITHOUT CONTRAST  TECHNIQUE: Contiguous axial images were obtained from the base of the skull through the vertex without intravenous contrast.  COMPARISON:  None.  FINDINGS: There is no evidence for acute hemorrhage, hydrocephalus, mass lesion, or abnormal extra-axial fluid collection. No definite CT evidence for acute infarction. Diffuse loss of parenchymal volume is consistent with atrophy. 3 Patchy low attenuation in the deep hemispheric and periventricular white matter is nonspecific, but likely reflects chronic microvascular ischemic demyelination. Left fronto temporal encephalomalacia is associated with ex vacuo dilatation of the lateral horn of the left lateral ventricle, findings suggesting previous left MCA territory infarct. The visualized paranasal sinuses and mastoid air cells are clear.  IMPRESSION: 1. No acute intracranial abnormality. 2. Old left MCA territory infarct. 3. Atrophy with chronic small vessel white matter ischemic disease.   Electronically Signed   By: Kennith Center M.D.   On: 09/07/2014 12:40   Scheduled: . antiseptic oral rinse  7 mL Mouth Rinse BID  .  cloNIDine  0.1 mg Oral BID  . labetalol  100 mg Oral BID  . lisinopril  20 mg Oral BID  . vancomycin  500 mg Intravenous Q M,W,F-HD      LOS: 5 days   Zian Delair C 10/04/14,2:07 PM

## 2014-09-13 NOTE — Code Documentation (Signed)
  Patient Name: Alexandra [name]   MRN: [id number]   Date of Birth/ Sex: [birth year] , female      Admission Date: 21-Sep-2014  Attending Provider: Alyson Reedy, MD  Primary Diagnosis: Hyperkalemia [E87.5] Acute pulmonary edema [J81.0] SOB (shortness of breath) [R06.02] History of ETT [Z92.89] Severe hypertension [I10]   Indication: Pt was in her usual state of health until this PM, when she was noted to be bradycardic with agonal breathing. Code blue was subsequently called. At the time of arrival on scene, ACLS protocol was underway.   Technical Description:  - CPR performance duration:  31 minutes  - Was defibrillation or cardioversion used? Yes   - Was external pacer placed? No  - Was patient intubated pre/post CPR? Yes   Medications Administered: Y = Yes; Blank = No Amiodarone  Y  Atropine    Calcium  Y  Epinephrine  Y  Lidocaine    Magnesium  Y  Norepinephrine    Phenylephrine    Sodium bicarbonate  Y  Vasopressin    Other    Post CPR evaluation:  - Final Status - Was patient successfully resuscitated ? No   Miscellaneous Information:  - Time of death:  5:18  PM  - Primary team notified?  Yes  - Family Notified? Yes     Lora Paula, MD   08/22/2014, 5:31 PM

## 2014-09-13 NOTE — Progress Notes (Signed)
[facility] PULMONARY / CRITICAL CARE MEDICINE   Name: Alexandra [name] MRN: [id number] DOB: 01/02/1946    ADMISSION DATE:  11-10-2014   REFERRING MD :  EDP  CHIEF COMPLAINT:  SOB  INITIAL PRESENTATION: SOB on HD day  STUDIES:    SIGNIFICANT EVENTS: 6/22 intubated in er 6/25- rectal bleeding noted, HD 6/26-Extubated   HISTORY OF PRESENT ILLNESS:   [name] , ESRD with Left I J tunneled HD cath, who presents with increased SOB prior to dialysis planned for today(6/22) and while in ED required intubation for increased SOB and resp failure. Very little information available at time of admission other than ESRD, WBC is 19 therefore will admit and treat empirically with abx , note her HD cath looks inflamed.  We will consult renal for hemodialysis as we note K+ of 7.6. Further information will be added as available.   SUBJECTIVE:  Alert , no distress  VITAL SIGNS: Temp:  [97.3 F (36.3 C)-98.1 F (36.7 C)] 97.3 F (36.3 C) (06/27 0815) Pulse Rate:  [89-120] 90 (06/27 0700) Resp:  [8-25] 13 (06/27 0700) BP: (128-167)/(43-63) 147/60 mmHg (06/27 0700) SpO2:  [87 %-100 %] 100 % (06/27 0700) FiO2 (%):  [40 %] 40 % (06/26 1217) Weight:  [58.3 kg (128 lb 8.5 oz)] 58.3 kg (128 lb 8.5 oz) (06/27 0500) HEMODYNAMICS: CVP:  [6 mmHg-13 mmHg] 8 mmHg VENTILATOR SETTINGS: Vent Mode:  [-]  FiO2 (%):  [40 %] 40 % INTAKE / OUTPUT:  Intake/Output Summary (Last 24 hours) at 09/12/2014 1000 Last data filed at 08/30/2014 0900  Gross per 24 hour  Intake  922.9 ml  Output      0 ml  Net  922.9 ml    PHYSICAL EXAMINATION: General:  AAF awake, follows commands, smiling Neuro:  Awake, RASS 0, glasses are cool and on  HEENT:  Rt EJ IV noted, jvdlower Cardiovascular:  HSR RRR; III/VI SEM  Lungs: clear to bases Abdomen:  Soft + bs Musculoskeletal:  intact Skin:  Warm and dry  LABS:  CBC  Recent Labs Lab 08/29/2014 0021 08/29/2014 0430 09/01/2014 0910  WBC 21.9* 22.3* 20.7*  HGB 9.2* 9.2* 9.3*   HCT 29.0* 29.8* 29.6*  PLT 179 194 173   Coag's  Recent Labs Lab 21-May-2014 1253 09/05/14 0335 09/06/14 0500 09/07/14 0950  APTT 24 142* >200* 34  INR 1.06  --   --  1.34   BMET  Recent Labs Lab 09/07/14 1600 09/08/14 0428 09/08/14 1547  NA 138 139 140  K 3.9 4.2 4.5  CL 102 103 106  CO2 25 23 22   BUN 8 21* 32*  CREATININE 1.96* 3.46* 4.44*  GLUCOSE 99 118* 122*   Electrolytes  Recent Labs Lab 09/05/14 0335  09/06/14 1600 09/07/14 1600 09/08/14 0428 09/08/14 1547  CALCIUM 8.6*  < > 8.5* 8.5* 8.7* 8.8*  MG 2.3  --   --   --   --   --   PHOS 3.0  < > 2.9 3.5  --  7.9*  < > = values in this interval not displayed. Sepsis Markers  Recent Labs Lab 21-May-2014 1529 21-May-2014 2000 09/05/14 0340  LATICACIDVEN 6.1* 4.8* 3.0*   ABG  Recent Labs Lab 21-May-2014 1308 21-May-2014 1630  PHART 7.322* 7.316*  PCO2ART 35.7 32.0*  PO2ART 182.0* 170*   Liver Enzymes  Recent Labs Lab 21-May-2014 1130 21-May-2014 1253  09/07/14 1600 09/08/14 0428 09/08/14 1547  AST 37 45*  --   --  177*  --  ALT 13* 12*  --   --  37  --   ALKPHOS 114 90  --   --  70  --   BILITOT 0.9 0.7  --   --  1.1  --   ALBUMIN 3.9 3.1*  < > 2.6* 2.6* 2.8*  < > = values in this interval not displayed. Cardiac Enzymes No results for input(s): TROPONINI, PROBNP in the last 168 hours. Glucose  Recent Labs Lab 09/08/14 1215 09/08/14 1627 09/08/14 1958 2014/09/10 0012 09-10-14 0400 2014-09-10 0813  GLUCAP 122* 23* 99 91 116* 123*    Imaging No results found.   ASSESSMENT / PLAN:  PULMONARY OETT 6/22>>6/26 A: VDRF secondary to fluid overload Passed SBT-->extubated 6/26 P:   IS while awake pulm hygiene   CARDIOVASCULAR CVL left i j tunneled hd cath Femoral cvl 6/22 >>d/c if no longer need- PIVs A:  HTN Decompensated systolic and diastolic HF-resolved Pulmonary edema  P:  Tele Volume removal w/ HD, planned for 6/27 Continue home antihypertensives -Clonidine 0.1/labetalol  /lisinopril20mg  Add back   RENAL A:  ESRD P:   HD 6/27 Chem in am  kvo- d/c D5NS IVF  GASTROINTESTINAL A:  GI protection, rectal bleeding P:   D/C PPI See heme Ensure has stool softeners Start diet later today   HEMATOLOGIC A:  Anemia of chronic dz Rectal bleeding Leukocytosis P:  Transfuse for Hb<7 scd Cont PPI  INFECTIOUS A:  presumed infection with HD cath presumed source Remains culture neg-afebrile P:   Trend fever curbe BCx2 6/22>>NGTD UC 6/22>>neg Sputum6/22>> oral flora Abx:  6/22 vanc>>6/27 6/22 pip-tazo>> 6/24 May need permacth pulled if positive blood cx, neg thus far and clinically improved  ENDOCRINE A:   Hyperglycemia P:   SSI  NEUROLOGIC A:   Agitation P:   Rass goal 0   FAMILY  - Updates: None at bedside  - Inter-disciplinary family meet or Palliative Care meeting due by:  day 7   STAFF NOTE: I, Rory Percy, MD FACP have personally reviewed patient's available data, including medical history, events of note, physical examination and test results as part of my evaluation. I have discussed with resident/NP and other care providers such as pharmacist, RN and RRT. In addition, I personally evaluated patient and elicited key findings of: has progressed well, HD today planned, CTA, dc vanc, not c/w infection source, dc fem line not required and risk only, to med floor, triad,  IS as able, chem in am, follow wbc further, related to hemoconcetration Need baslien neuro history, she is not speaking well, may need CT head  Mcarthur Rossetti. Tyson Alias, MD, FACP Pgr: [phone] [facility] 2014-09-10 10:39 AM

## 2014-09-13 NOTE — Procedures (Addendum)
PCCM Code Blue Note  Called urgently to HD unit regarding CPR in progress to support Ms Offutt. By report she was on HD, hemodynamically stable, and was then noted to have rapid bradycardia to asystole at 16:47. CPR was initiated immediately. Airway obtained at 16:51. She received epi + bicarb + calcium, amiodarone. CBG was obtained that was profoundly low and d50 was given. Per Code sheet a rhythm was obtained @ 16:56, recorded as VF. BP at that time was measured at 139/79. She was shocked but devolved into asystole and CPR was continued. Despite several rounds of meds a perfusing rhythm was never re-obtained. On my arrival to bedside CPR was in progress. Another round of calcium, bicarb, epi was given and circulated. The rhythm remained a fine V fib without any change won serial attempts at defibrillation. Given the ineffectiveness of CPR, no apparent reversible causes and the duration of CPR I elected to call the code at 17:18. The time of death will be marked as that time. I spoke to the patient's medical POA Hollice EspyNicole Metcalf by phone and notified her that the patient had not survived. She informed me that another family member was on her way to the hospital now.   Levy Pupaobert Byrum, MD, PhD 08/17/2014, 5:36 PM [facility] [phone] or if no answer [phone]

## 2014-09-13 NOTE — Progress Notes (Signed)
Hemodialysis: Received report from Xiaobo zhao  regarding Alexandra [name] at approximately 1500 . Patient treatment was initiated at 14:14. Patient was received stable with all vital signs WNL.  Wrist restraints on patient's arms but not tide.

## 2014-09-13 DEATH — deceased

## 2014-09-23 ENCOUNTER — Encounter: Payer: Self-pay | Admitting: *Deleted

## 2014-10-10 NOTE — Discharge Summary (Signed)
NAME:  Alexandra [name], Alexandra [name]       ACCOUNT NO.:  [id number]  MEDICAL RECORD NO.:  [id number]  LOCATION:  HD08C                        FACILITY:  [facility]  PHYSICIAN:  Nelda Bucks, MD DATE OF BIRTH:  [birth year]  DATE OF ADMISSION:  08/19/2014 DATE OF DISCHARGE:  Sep 27, 2014                              DISCHARGE SUMMARY   DEATH SUMMARY  This is a 69 year old African American female with end-stage renal disease, left IJ tunneled HD catheter, presented with increased shortness of breath prior to dialysis plan from August 15, 2014, and while in the emergency room required intubation for increased shortness of breath, respiratory failure.  There was not much of hospital history provided at that time.  She was found to have a white count elevation, was treated with empiric antibiotics.  She also was noted to have calcium elevation of 7.6 requiring dialysis emergently.  The patient required intubation secondary to fluid overload, was extubated successfully on September 07, 2014.  She has limited access, therefore femoral line was placed on September 04, 2014, which was discontinued.  She was diagnosed of decompensated systolic and diastolic heart failure and pulmonary edema.  Volume was removed with dialysis and planned to be repeated on September 27, 2014.  She was progressing after extubation. Cultures were negative.  There was a presumption that there was HD catheter infection for which she was treated with vancomycin and Zosyn. Awaiting blood cultures to be positive, which they were not.  The patient went up for dialysis as planned.  She is progressing reasonably well and was found on dialysis to go into VFib arrest, cardiac arrest, and had refractory VFib, was unable to be resuscitated and expired.  FINAL DIAGNOSES: 1. Ventricular fibrillation, rule out myocardial infarction, rule out     ischemia, rule out secondary to preload reduction from dialysis,     rule out hyperkalemia. 2. End-stage  renal disease. 3. Concerned for HD catheter infection. 4. Present on admission with pulmonary edema and acute respiratory     failure. 5. Hyperkalemia on admission.  Status post emergent dialysis.     Nelda Bucks, MD     DJF/MEDQ  D:  10/09/2014  T:  10/10/2014  Job:  [id number]
